# Patient Record
Sex: Male | Born: 1950 | Race: White | Hispanic: No | Smoking: Former smoker
Health system: Southern US, Community
[De-identification: ages and names within clinical notes are randomized; demographics above are authoritative.]

## PROBLEM LIST (undated history)

## (undated) DIAGNOSIS — I219 Acute myocardial infarction, unspecified: Secondary | ICD-10-CM

## (undated) DIAGNOSIS — I10 Essential (primary) hypertension: Secondary | ICD-10-CM

## (undated) DIAGNOSIS — G4730 Sleep apnea, unspecified: Secondary | ICD-10-CM

## (undated) DIAGNOSIS — E785 Hyperlipidemia, unspecified: Secondary | ICD-10-CM

## (undated) DIAGNOSIS — K7460 Unspecified cirrhosis of liver: Secondary | ICD-10-CM

## (undated) DIAGNOSIS — H269 Unspecified cataract: Secondary | ICD-10-CM

## (undated) DIAGNOSIS — F32A Depression, unspecified: Secondary | ICD-10-CM

## (undated) DIAGNOSIS — I8500 Esophageal varices without bleeding: Secondary | ICD-10-CM

## (undated) DIAGNOSIS — T7840XA Allergy, unspecified, initial encounter: Secondary | ICD-10-CM

## (undated) DIAGNOSIS — I2510 Atherosclerotic heart disease of native coronary artery without angina pectoris: Secondary | ICD-10-CM

## (undated) DIAGNOSIS — F329 Major depressive disorder, single episode, unspecified: Secondary | ICD-10-CM

## (undated) DIAGNOSIS — M1990 Unspecified osteoarthritis, unspecified site: Secondary | ICD-10-CM

## (undated) DIAGNOSIS — K219 Gastro-esophageal reflux disease without esophagitis: Secondary | ICD-10-CM

## (undated) DIAGNOSIS — D649 Anemia, unspecified: Secondary | ICD-10-CM

## (undated) HISTORY — PX: CATARACT EXTRACTION: SUR2

## (undated) HISTORY — DX: Unspecified cirrhosis of liver: K74.60

## (undated) HISTORY — DX: Depression, unspecified: F32.A

## (undated) HISTORY — DX: Allergy, unspecified, initial encounter: T78.40XA

## (undated) HISTORY — DX: Anemia, unspecified: D64.9

## (undated) HISTORY — DX: Essential (primary) hypertension: I10

## (undated) HISTORY — DX: Major depressive disorder, single episode, unspecified: F32.9

## (undated) HISTORY — DX: Atherosclerotic heart disease of native coronary artery without angina pectoris: I25.10

## (undated) HISTORY — DX: Esophageal varices without bleeding: I85.00

## (undated) HISTORY — DX: Hyperlipidemia, unspecified: E78.5

## (undated) HISTORY — DX: Gastro-esophageal reflux disease without esophagitis: K21.9

## (undated) HISTORY — DX: Acute myocardial infarction, unspecified: I21.9

## (undated) HISTORY — DX: Sleep apnea, unspecified: G47.30

## (undated) HISTORY — DX: Unspecified osteoarthritis, unspecified site: M19.90

## (undated) HISTORY — DX: Unspecified cataract: H26.9

---
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
## 1998-03-02 ENCOUNTER — Emergency Department (HOSPITAL_COMMUNITY): Admission: EM | Admit: 1998-03-02 | Discharge: 1998-03-02 | Payer: Self-pay | Admitting: Emergency Medicine

## 1998-10-31 ENCOUNTER — Ambulatory Visit (HOSPITAL_COMMUNITY): Admission: RE | Admit: 1998-10-31 | Discharge: 1998-10-31 | Payer: Self-pay

## 1998-12-16 ENCOUNTER — Ambulatory Visit (HOSPITAL_COMMUNITY): Admission: RE | Admit: 1998-12-16 | Discharge: 1998-12-16 | Payer: Self-pay

## 2008-08-21 ENCOUNTER — Ambulatory Visit: Payer: Self-pay | Admitting: Internal Medicine

## 2008-08-21 DIAGNOSIS — G4730 Sleep apnea, unspecified: Secondary | ICD-10-CM | POA: Insufficient documentation

## 2008-08-21 DIAGNOSIS — J309 Allergic rhinitis, unspecified: Secondary | ICD-10-CM | POA: Insufficient documentation

## 2008-08-21 DIAGNOSIS — F339 Major depressive disorder, recurrent, unspecified: Secondary | ICD-10-CM | POA: Insufficient documentation

## 2008-08-23 LAB — CONVERTED CEMR LAB
ALT: 34 units/L (ref 0–53)
AST: 27 units/L (ref 0–37)
Albumin: 3.9 g/dL (ref 3.5–5.2)
Alkaline Phosphatase: 43 units/L (ref 39–117)
BUN: 13 mg/dL (ref 6–23)
Basophils Relative: 0.1 % (ref 0.0–3.0)
Bilirubin, Direct: 0.1 mg/dL (ref 0.0–0.3)
Calcium: 8.7 mg/dL (ref 8.4–10.5)
Chloride: 109 meq/L (ref 96–112)
Creatinine, Ser: 1.2 mg/dL (ref 0.4–1.5)
Eosinophils Absolute: 0.3 10*3/uL (ref 0.0–0.7)
Eosinophils Relative: 7.1 % — ABNORMAL HIGH (ref 0.0–5.0)
GFR calc Af Amer: 80 mL/min
GFR calc non Af Amer: 66 mL/min
HDL: 29.9 mg/dL — ABNORMAL LOW (ref >39.0)
LDL Cholesterol: 133 mg/dL — ABNORMAL HIGH (ref 0–99)
Lymphocytes Relative: 33.7 % (ref 12.0–46.0)
Monocytes Relative: 11.9 % (ref 3.0–12.0)
Neutrophils Relative %: 47.2 % (ref 43.0–77.0)
Platelets: 139 10*3/uL — ABNORMAL LOW (ref 150–400)
RBC: 4.15 M/uL — ABNORMAL LOW (ref 4.22–5.81)
Total CHOL/HDL Ratio: 6.6
Total Protein: 6.3 g/dL (ref 6.0–8.3)
VLDL: 35 mg/dL (ref 0–40)
WBC: 4.2 10*3/uL — ABNORMAL LOW (ref 4.5–10.5)

## 2008-09-04 ENCOUNTER — Encounter (INDEPENDENT_AMBULATORY_CARE_PROVIDER_SITE_OTHER): Payer: Self-pay | Admitting: *Deleted

## 2008-09-04 ENCOUNTER — Ambulatory Visit: Payer: Self-pay | Admitting: Internal Medicine

## 2008-09-04 LAB — CONVERTED CEMR LAB: Fecal Occult Bld: NEGATIVE

## 2008-10-15 ENCOUNTER — Ambulatory Visit: Payer: Self-pay | Admitting: Internal Medicine

## 2008-10-15 DIAGNOSIS — R7309 Other abnormal glucose: Secondary | ICD-10-CM | POA: Insufficient documentation

## 2008-11-05 ENCOUNTER — Telehealth: Payer: Self-pay | Admitting: Internal Medicine

## 2008-11-06 ENCOUNTER — Encounter: Payer: Self-pay | Admitting: Internal Medicine

## 2008-11-15 ENCOUNTER — Telehealth: Payer: Self-pay | Admitting: Internal Medicine

## 2008-11-18 ENCOUNTER — Encounter: Admission: RE | Admit: 2008-11-18 | Discharge: 2008-11-18 | Payer: Self-pay | Admitting: Internal Medicine

## 2008-11-19 ENCOUNTER — Encounter: Payer: Self-pay | Admitting: Internal Medicine

## 2008-12-25 ENCOUNTER — Ambulatory Visit: Payer: Self-pay | Admitting: Internal Medicine

## 2008-12-25 DIAGNOSIS — R079 Chest pain, unspecified: Secondary | ICD-10-CM | POA: Insufficient documentation

## 2008-12-26 ENCOUNTER — Telehealth: Payer: Self-pay | Admitting: Internal Medicine

## 2009-01-01 ENCOUNTER — Ambulatory Visit: Payer: Self-pay

## 2009-01-01 ENCOUNTER — Telehealth: Payer: Self-pay | Admitting: Internal Medicine

## 2009-01-01 ENCOUNTER — Encounter: Payer: Self-pay | Admitting: Internal Medicine

## 2009-01-01 LAB — CONVERTED CEMR LAB
BUN: 25 mg/dL — ABNORMAL HIGH (ref 6–23)
CO2: 29 meq/L (ref 19–32)
Eosinophils Relative: 6.7 % — ABNORMAL HIGH (ref 0.0–5.0)
Glucose, Bld: 136 mg/dL — ABNORMAL HIGH (ref 70–99)
Hemoglobin: 15.6 g/dL (ref 13.0–17.0)
INR: 1 (ref 0.8–1.0)
Lymphocytes Relative: 30 % (ref 12.0–46.0)
Monocytes Relative: 8.9 % (ref 3.0–12.0)
Neutro Abs: 3.2 10*3/uL (ref 1.4–7.7)
Platelets: 150 10*3/uL (ref 150–400)
Potassium: 4.2 meq/L (ref 3.5–5.1)
Prothrombin Time: 10.6 s — ABNORMAL LOW (ref 10.9–13.3)
RDW: 12.6 % (ref 11.5–14.6)
Sodium: 141 meq/L (ref 135–145)
WBC: 5.9 10*3/uL (ref 4.5–10.5)

## 2009-01-03 ENCOUNTER — Ambulatory Visit: Payer: Self-pay | Admitting: Cardiology

## 2009-01-03 ENCOUNTER — Inpatient Hospital Stay (HOSPITAL_COMMUNITY): Admission: AD | Admit: 2009-01-03 | Discharge: 2009-01-05 | Payer: Self-pay | Admitting: Cardiology

## 2009-01-03 ENCOUNTER — Inpatient Hospital Stay (HOSPITAL_BASED_OUTPATIENT_CLINIC_OR_DEPARTMENT_OTHER): Admission: RE | Admit: 2009-01-03 | Discharge: 2009-01-03 | Payer: Self-pay | Admitting: Cardiology

## 2009-01-06 ENCOUNTER — Ambulatory Visit: Payer: Self-pay | Admitting: Cardiology

## 2009-01-06 ENCOUNTER — Inpatient Hospital Stay (HOSPITAL_COMMUNITY): Admission: RE | Admit: 2009-01-06 | Discharge: 2009-01-07 | Payer: Self-pay | Admitting: Cardiology

## 2009-01-09 ENCOUNTER — Encounter (INDEPENDENT_AMBULATORY_CARE_PROVIDER_SITE_OTHER): Payer: Self-pay | Admitting: *Deleted

## 2009-01-20 ENCOUNTER — Ambulatory Visit: Payer: Self-pay | Admitting: Cardiology

## 2009-01-27 HISTORY — PX: CORONARY ANGIOPLASTY WITH STENT PLACEMENT: SHX49

## 2009-02-04 ENCOUNTER — Encounter: Admission: RE | Admit: 2009-02-04 | Discharge: 2009-02-11 | Payer: Self-pay | Admitting: Internal Medicine

## 2009-02-04 DIAGNOSIS — I2510 Atherosclerotic heart disease of native coronary artery without angina pectoris: Secondary | ICD-10-CM | POA: Insufficient documentation

## 2009-02-05 ENCOUNTER — Ambulatory Visit: Payer: Self-pay

## 2009-02-05 ENCOUNTER — Ambulatory Visit: Payer: Self-pay | Admitting: Cardiology

## 2009-02-05 ENCOUNTER — Encounter: Payer: Self-pay | Admitting: Cardiology

## 2009-02-05 LAB — CONVERTED CEMR LAB
BUN: 25 mg/dL — ABNORMAL HIGH (ref 6–23)
Basophils Relative: 0.7 % (ref 0.0–3.0)
Bilirubin, Direct: 0.1 mg/dL (ref 0.0–0.3)
CO2: 29 meq/L (ref 19–32)
Calcium: 9 mg/dL (ref 8.4–10.5)
Cholesterol: 98 mg/dL (ref 0–200)
Creatinine, Ser: 1.2 mg/dL (ref 0.4–1.5)
Eosinophils Relative: 6.7 % — ABNORMAL HIGH (ref 0.0–5.0)
Glucose, Bld: 136 mg/dL — ABNORMAL HIGH (ref 70–99)
Hemoglobin: 15.6 g/dL (ref 13.0–17.0)
INR: 1 (ref 0.8–1.0)
LDL Cholesterol: 55 mg/dL (ref 0–99)
Lymphocytes Relative: 30 % (ref 12.0–46.0)
Monocytes Relative: 8.9 % (ref 3.0–12.0)
Neutro Abs: 3.2 10*3/uL (ref 1.4–7.7)
Prothrombin Time: 10.6 s — ABNORMAL LOW (ref 10.9–13.3)
RBC: 4.68 M/uL (ref 4.22–5.81)
Sodium: 141 meq/L (ref 135–145)
Total Protein: 7 g/dL (ref 6.0–8.3)
VLDL: 16 mg/dL (ref 0–40)
WBC: 5.9 10*3/uL (ref 4.5–10.5)

## 2009-02-12 ENCOUNTER — Encounter: Payer: Self-pay | Admitting: Internal Medicine

## 2009-02-27 ENCOUNTER — Telehealth: Payer: Self-pay | Admitting: Internal Medicine

## 2009-03-25 ENCOUNTER — Telehealth: Payer: Self-pay | Admitting: Internal Medicine

## 2009-03-25 DIAGNOSIS — R21 Rash and other nonspecific skin eruption: Secondary | ICD-10-CM | POA: Insufficient documentation

## 2009-04-16 ENCOUNTER — Ambulatory Visit: Payer: Self-pay | Admitting: Internal Medicine

## 2009-04-16 DIAGNOSIS — M159 Polyosteoarthritis, unspecified: Secondary | ICD-10-CM | POA: Insufficient documentation

## 2009-04-16 LAB — CONVERTED CEMR LAB
Albumin: 4.1 g/dL (ref 3.5–5.2)
Calcium: 9.1 mg/dL (ref 8.4–10.5)
Creatinine, Ser: 1.1 mg/dL (ref 0.4–1.5)
Creatinine,U: 133.6 mg/dL
Glucose, Bld: 179 mg/dL — ABNORMAL HIGH (ref 70–99)
Microalb, Ur: 1.8 mg/dL (ref 0.0–1.9)
Sodium: 139 meq/L (ref 135–145)

## 2009-04-22 ENCOUNTER — Encounter: Payer: Self-pay | Admitting: Internal Medicine

## 2009-04-25 ENCOUNTER — Ambulatory Visit: Payer: Self-pay | Admitting: Family Medicine

## 2009-05-07 ENCOUNTER — Encounter: Admission: RE | Admit: 2009-05-07 | Discharge: 2009-05-07 | Payer: Self-pay | Admitting: Family Medicine

## 2009-06-24 DIAGNOSIS — E785 Hyperlipidemia, unspecified: Secondary | ICD-10-CM | POA: Insufficient documentation

## 2009-06-25 ENCOUNTER — Ambulatory Visit: Payer: Self-pay | Admitting: Cardiology

## 2009-07-02 ENCOUNTER — Encounter: Payer: Self-pay | Admitting: Adult Health

## 2009-08-13 ENCOUNTER — Ambulatory Visit: Payer: Self-pay | Admitting: Internal Medicine

## 2009-10-02 ENCOUNTER — Ambulatory Visit: Payer: Self-pay | Admitting: Internal Medicine

## 2009-12-24 ENCOUNTER — Ambulatory Visit: Payer: Self-pay | Admitting: Cardiology

## 2010-01-14 ENCOUNTER — Telehealth: Payer: Self-pay | Admitting: Cardiology

## 2010-02-06 ENCOUNTER — Telehealth: Payer: Self-pay | Admitting: Internal Medicine

## 2010-02-11 ENCOUNTER — Ambulatory Visit: Payer: Self-pay | Admitting: Internal Medicine

## 2010-02-11 LAB — CONVERTED CEMR LAB
Microalb, Ur: 9.1 mg/dL — ABNORMAL HIGH (ref 0.0–1.9)
PSA: 1.38 ng/mL (ref 0.10–4.00)
TSH: 1.18 microintl units/mL (ref 0.35–5.50)

## 2010-02-20 ENCOUNTER — Ambulatory Visit: Payer: Self-pay | Admitting: Internal Medicine

## 2010-02-23 ENCOUNTER — Encounter: Payer: Self-pay | Admitting: Internal Medicine

## 2010-02-23 LAB — CONVERTED CEMR LAB: Fecal Occult Bld: NEGATIVE

## 2010-05-13 ENCOUNTER — Telehealth: Payer: Self-pay | Admitting: Cardiology

## 2010-05-14 ENCOUNTER — Telehealth: Payer: Self-pay | Admitting: Internal Medicine

## 2010-06-11 ENCOUNTER — Ambulatory Visit: Payer: Self-pay | Admitting: Cardiology

## 2010-08-18 ENCOUNTER — Ambulatory Visit: Payer: Self-pay | Admitting: Internal Medicine

## 2010-08-19 LAB — CONVERTED CEMR LAB: Hgb A1c MFr Bld: 8 % — ABNORMAL HIGH (ref 4.6–6.5)

## 2010-12-08 ENCOUNTER — Encounter: Payer: Self-pay | Admitting: Cardiovascular Disease

## 2010-12-08 ENCOUNTER — Ambulatory Visit
Admission: RE | Admit: 2010-12-08 | Discharge: 2010-12-08 | Payer: Self-pay | Source: Home / Self Care | Attending: Cardiovascular Disease | Admitting: Cardiovascular Disease

## 2010-12-21 DIAGNOSIS — I10 Essential (primary) hypertension: Secondary | ICD-10-CM | POA: Insufficient documentation

## 2010-12-27 LAB — CONVERTED CEMR LAB
AST: 47 units/L — ABNORMAL HIGH (ref 0–37)
Alkaline Phosphatase: 50 units/L (ref 39–117)
Alkaline Phosphatase: 73 units/L (ref 39–117)
BUN: 15 mg/dL (ref 6–23)
Basophils Absolute: 0 10*3/uL (ref 0.0–0.1)
Bilirubin, Direct: 0.1 mg/dL (ref 0.0–0.3)
Calcium: 9.6 mg/dL (ref 8.4–10.5)
Cholesterol: 116 mg/dL (ref 0–200)
Creatinine, Ser: 1.1 mg/dL (ref 0.4–1.5)
GFR calc non Af Amer: 72.39 mL/min (ref >60)
Glucose, Bld: 182 mg/dL — ABNORMAL HIGH (ref 70–99)
HDL: 28 mg/dL — ABNORMAL LOW (ref >39.00)
HDL: 37.3 mg/dL — ABNORMAL LOW (ref >39.00)
LDL Cholesterol: 36 mg/dL (ref 0–99)
LDL Cholesterol: 48 mg/dL (ref 0–99)
Lymphocytes Relative: 31.1 % (ref 12.0–46.0)
Monocytes Relative: 11.9 % (ref 3.0–12.0)
Neutrophils Relative %: 50.7 % (ref 43.0–77.0)
Platelets: 159 10*3/uL (ref 150.0–400.0)
RDW: 13.7 % (ref 11.5–14.6)
Sodium: 137 meq/L (ref 135–145)
Total Bilirubin: 0.4 mg/dL (ref 0.3–1.2)
Total Bilirubin: 0.8 mg/dL (ref 0.3–1.2)
Total CHOL/HDL Ratio: 3
Total Protein: 6.9 g/dL (ref 6.0–8.3)
VLDL: 31.2 mg/dL (ref 0.0–40.0)

## 2010-12-30 ENCOUNTER — Encounter: Payer: Self-pay | Admitting: Internal Medicine

## 2010-12-31 NOTE — Progress Notes (Signed)
Summary: rx for 90 days supply - refill meds   Phone Note Refill Request Call back at Home Phone [phone] Call back at [phone] Message from:  Patient on May 13, 2010 1:57 PM  Refills Requested: Medication #1:  EFFIENT 10 MG TABS Take one by mouth daily   Supply Requested: 3 months  Medication #2:  CRESTOR 40 MG TABS Take one by mouth daily   Supply Requested: 3 months  Medication #3:  TOPROL XL 25 MG XR24H-TAB Take one by mouth daily   Supply Requested: 3 months need insurance need new rx for 90 days .    Method Requested: Pick up at Office Initial call taken by: Lorne Skeens,  May 13, 2010 1:58 PM  Follow-up for Phone Call        script left in front Follow-up by: Burnett Kanaris, CNA,  May 18, 2010 12:00 PM

## 2010-12-31 NOTE — Progress Notes (Signed)
Summary: Effient    Phone Note Outgoing Call   Call placed by: Sherri Rad, RN, BSN,  January 14, 2010 3:42 PM Call placed to: Patient Summary of Call: I left a message for the pt to call - Effient has arrived. Sherri Rad, RN, BSN  January 14, 2010 3:42 PM  Left message to call back. Sherri Rad, RN, BSN  January 15, 2010 10:37 AM  The pt is aware his Effient has arrived and I will place this at the front desk for p/u.  Dispensed Effient 10mg  - 4 bottles with 30 tabs each. Initial call taken by: Sherri Rad, RN, BSN,  January 15, 2010 12:03 PM

## 2010-12-31 NOTE — Assessment & Plan Note (Signed)
Summary: cpx/rbh   Vital Signs:  Patient profile:   60 year old male Weight:      204 pounds Temp:     98.7 degrees F oral Pulse rate:   68 / minute Pulse rhythm:   regular BP sitting:   112 / 70  (left arm) Cuff size:   large  Vitals Entered By: Mervin Hack CMA Duncan Dull) (February 11, 2010 9:28 AM) CC: adult physical   History of Present Illness: Generally doing okay Developed some cough last week Thought maybe allergies but some dark sputum Not really sick feels better now but some lingering cough Med does help  Does have spring allergies uses loratadine as needed   Heart is doing well  Monitors blood sugar in AM 2-3 times per week 180-200 No hypoglycemia  Allergies: No Known Drug Allergies  Past History:  Past medical, surgical, family and social histories (including risk factors) reviewed for relevance to current acute and chronic problems.  Past Medical History: Reviewed history from 06/24/2009 and no changes required. Allergic rhinitis Depression Sleep Apnea Diabetes mellitus, type II CAD S/P bifurcation stenting CFX (DES x 2) and stenting bifurcation lesion distal RCA (1 DES) Osteoarthritis Good left ventricular function. Hyperlipidemia.  Past Surgical History: Reviewed history from 04/16/2009 and no changes required. Tonsillectomy-1959 Stent 3/10    ----------------------------Dr Juanda Chance  Family History: Reviewed history from 08/13/2009 and no changes required. 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 Eyes:  Denies double vision and  vision loss-1 eye; does have floaters keeps up with eye doctor. ENT:  Complains of decreased hearing and ringing in ears; wife notes mild hearing loss brief tinnitus at  Teeth okay---regular with dentist. CV:  Denies chest pain or discomfort, difficulty breathing at night, difficulty breathing while lying down, fainting, lightheadness, palpitations, and shortness of breath with exertion. Resp:  See HPI; Complains of cough; denies shortness of breath. GI:  Denies change in bowel habits, dark tarry stools, indigestion, nausea, and vomiting; occ blood on toilet paper. GU:  Complains of nocturia and urinary hesitancy; denies erectile dysfunction and urinary frequency. MS:  Complains of joint pain; denies joint swelling; left hip arthritis Pain goes down leg does exercises as needed ibuprofen. Derm:  Denies lesion(s) and rash. Neuro:  Complains of headaches; denies numbness, tingling, and weakness; occ headaches---uses tylenol as needed . Psych:  Denies anxiety and depression; stress--lost job, son with paranoid schizophrenia. Heme:  Denies abnormal bruising and enlarge lymph nodes; bruises easy due to meds. Allergy:  Complains of seasonal allergies and sneezing.  Physical Exam  General:  alert and normal appearance.   Eyes:  pupils equal, pupils round, pupils reactive to light, and no optic disk abnormalities.   Ears:  R ear normal and L ear normal.   Mouth:  no erythema, no exudates, and no lesions.   Neck:  supple, no masses, no thyromegaly, no carotid bruits, and no cervical lymphadenopathy.   Lungs:  normal respiratory effort and normal breath sounds.   Heart:  normal rate, regular rhythm, no murmur, and no gallop.   Abdomen:  soft  and non-tender.   Rectal:  no hemorrhoids and no masses.   Prostate:  no gland enlargement and no nodules.   Msk:  no joint tenderness and no joint swelling.   Pulses:  2+ in feet Extremities:  no edema Neurologic:  alert & oriented X3, strength normal in  all extremities, and gait normal.   Skin:  no rashes and no suspicious lesions.   Axillary Nodes:  No palpable lymphadenopathy Psych:  normally interactive, good eye contact, not anxious appearing, and not depressed appearing.    Diabetes Management Exam:    Foot Exam (with socks and/or shoes not present):       Sensory-Pinprick/Light touch:          Left medial foot (L-4): normal          Left dorsal foot (L-5): normal          Left lateral foot (S-1): normal          Right medial foot (L-4): normal          Right dorsal foot (L-5): normal          Right lateral foot (S-1): normal       Inspection:          Left foot: normal          Right foot: normal       Nails:          Left foot: normal          Right foot: normal   Impression & Recommendations:  Problem # 1:  PREVENTIVE HEALTH CARE (ICD-V70.0) Assessment Comment Only due for stool immunoassay and PSA discussed fitness pneumovax  Problem # 2:  DIABETES MELLITUS, TYPE II (ICD-250.00) Assessment: Comment Only  has had some high AM sugars needs to watch evening snacking increase metformin as needed   His updated medication list for this problem includes:    Metformin Hcl 500 Mg Tabs (Metformin hcl) .[name]... Take 1 tablet by mouth two times a day before meals    Aspirin Ec 325 Mg Tbec (Aspirin) .[name]... Take one tablet by mouth daily  Labs Reviewed: Creat: 1.1 (12/24/2009)     Last Eye Exam: normal (02/27/2009) Reviewed HgBA1c results: 7.1 (08/13/2009)  7.2 (04/16/2009)  Orders: TLB-Microalbumin/Creat Ratio, Urine (82043-MALB) TLB-A1C / Hgb A1C (Glycohemoglobin) (83036-A1C) TLB-TSH (Thyroid Stimulating Hormone) (84443-TSH) Venipuncture (14782)  Problem # 3:  OSTEOARTHRITIS (ICD-715.90) Assessment: Unchanged some ongoing hip and leg pain ibuprofen as needed   Problem # 4:  DEPRESSION (ICD-311) Assessment: Unchanged mood has been stable despite multiple stressors  His updated medication list for this problem  includes:    Paroxetine Hcl 20 Mg Tabs (Paroxetine hcl) .[name]... Take 2 tabs daily for depression  Problem # 5:  CAD, NATIVE VESSEL (ICD-414.01) Assessment: Unchanged no symptoms follows with cardiology  His updated medication list for this problem includes:    Toprol Xl 25 Mg Xr24h-tab (Metoprolol succinate) .[name]... Take one by mouth daily    Effient 10 Mg Tabs (Prasugrel hcl) .[name]... Take one by mouth daily    Nitrostat 0.4 Mg Subl (Nitroglycerin) .[name]... As needed    Aspirin Ec 325 Mg Tbec (Aspirin) .[name]... Take one tablet by mouth daily  Complete Medication List: 1)  Paroxetine Hcl 20 Mg Tabs (Paroxetine hcl) .... Take 2 tabs daily for depression 2)  Onetouch Lancets Misc (Lancets) .... Test daily or as directed. 3)  Onetouch Basic System W/device Kit (Blood glucose monitoring suppl) .... Use as directed dx: 250.00  onetouch ultrasoft 4)  Toprol Xl 25 Mg Xr24h-tab (Metoprolol succinate) .... Take one by mouth daily 5)  Crestor 40 Mg Tabs (Rosuvastatin calcium) .... Take one by mouth daily 6)  Effient 10 Mg Tabs (Prasugrel hcl) .... Take one by mouth daily 7)  Nitrostat 0.4 Mg Subl (Nitroglycerin) .... As needed 8)  Metformin Hcl 500 Mg Tabs (Metformin hcl) .... Take 1 tablet by mouth two times a day before meals 9)  Aspirin Ec 325 Mg Tbec (Aspirin) .... Take one tablet by mouth daily 10)  Benzonatate 200 Mg Caps (Benzonatate) .... Take 1 by mouth three times a day as needed cough  Other Orders: TLB-PSA (Prostate Specific Antigen) (84153-PSA) Pneumococcal Vaccine (16109) Admin 1st Vaccine (60454) Admin 1st Vaccine [facility]) [phone])  Patient Instructions: 1)  Please schedule a follow-up appointment in 6 months .  2)  Complete your hemoccult cards and return them soon.  Prescriptions: BENZONATATE 200 MG CAPS (BENZONATATE) take 1 by mouth three times a day as needed cough  #60 x 0   Entered and Authorized by:   Cindee Salt MD   Signed by:   Cindee Salt MD on 02/11/2010    Method used:   Electronically to        [facility]  [address] [id number] (retail)       [address]       [place], [place]  [address]       Ph: [phone] or [phone]       Fax: [phone]   RxID:   [id number]   Current Allergies (reviewed today): No known allergies    Pneumovax Vaccine    Vaccine Type: Pneumovax    Site: left deltoid    Mfr: Merck    Dose: 0.5 ml    Route: IM    Given by: Mervin Hack CMA (AAMA)    Exp. Date: 07/13/2011    Lot #: [id number]    VIS given: 06/26/96 version given February 11, 2010.

## 2010-12-31 NOTE — Progress Notes (Signed)
Summary: written script  Phone Note Refill Request Call back at Home Phone [phone] Call back at [phone] Message from:  Patient on May 14, 2010 4:20 PM  Refills Requested: Medication #1:  PAROXETINE HCL 20 MG TABS Take 2 tabs daily for depression  Medication #2:  METFORMIN HCL 1000 MG TABS take 1 by mouth two times a day. Patient needs written script for 90 day supply for his mail order.    Method Requested: Pick up at Office Initial call taken by: Melody Comas,  May 14, 2010 4:19 PM  Follow-up for Phone Call        Rx written Follow-up by: Cindee Salt MD,  May 15, 2010 1:18 PM  Additional Follow-up for Phone Call Additional follow up Details #1::        left message on machine that rx ready for pick-up  Additional Follow-up by: DeShannon Smith CMA Duncan Dull),  May 15, 2010 2:48 PM    New/Updated Medications: METFORMIN HCL 500 MG TABS (METFORMIN HCL) take 1 tablet by mouth two times a day before meals Prescriptions: METFORMIN HCL 500 MG TABS (METFORMIN HCL) take 1 tablet by mouth two times a day before meals  #180 x 3   Entered and Authorized by:   Cindee Salt MD   Signed by:   Cindee Salt MD on 05/15/2010   Method used:   Print then Give to Patient   RxID:   [id number] PAROXETINE HCL 20 MG TABS (PAROXETINE HCL) Take 2 tabs daily for depression  #180 x 3   Entered and Authorized by:   Cindee Salt MD   Signed by:   Cindee Salt MD on 05/15/2010   Method used:   Print then Give to Patient   RxID:   [id number]

## 2010-12-31 NOTE — Assessment & Plan Note (Signed)
Summary: f63m      Allergies Added: NKDA  Visit Type:  Follow-up Primary Provider:  Cindee Salt MD  CC:  no complaints.  History of Present Illness: The patient is 60 years old and return for followup management of CAD. In February of 2010 he had bifurcation stenting of the circumflex and obtuse marginal branch with Promus drug-eluting stents and he had a Promus drug eluding stent to the right coronary artery. He has done quite well since that time has had no recent chest pain shortness of breath or palpitations.  His other problems include diabetes and hyperlipidemia. His last lipid profile in January had an HDL of 37 and LDL of 48.  Current Medications (verified): 1)  Paroxetine Hcl 20 Mg Tabs (Paroxetine Hcl) .... Take 2 Tabs Daily For Depression 2)  Onetouch Lancets  Misc (Lancets) .... Test Daily or As Directed. 3)  Onetouch Basic System W/device Kit (Blood Glucose Monitoring Suppl) .... Use As Directed Dx: 250.00 Onetouch Ultrasoft 4)  Toprol Xl 25 Mg Xr24h-Tab (Metoprolol Succinate) .... Take One By Mouth Daily 5)  Crestor 40 Mg Tabs (Rosuvastatin Calcium) .... Take One By Mouth Daily 6)  Effient 10 Mg Tabs (Prasugrel Hcl) .... Take One By Mouth Daily 7)  Nitrostat 0.4 Mg Subl (Nitroglycerin) .... As Needed 8)  Aspirin Ec 325 Mg Tbec (Aspirin) .... Take One Tablet By Mouth Daily 9)  Benzonatate 200 Mg Caps (Benzonatate) .... Take 1 By Mouth Three Times A Day As Needed Cough 10)  Metformin Hcl 1000 Mg Tabs (Metformin Hcl) .... Take 1 By Mouth Two Times A Day  Allergies (verified): No Known Drug Allergies  Past History:  Past Medical History: Reviewed history from 06/24/2009 and no changes required. Allergic rhinitis Depression Sleep Apnea Diabetes mellitus, type II CAD S/P bifurcation stenting CFX (DES x 2) and stenting bifurcation lesion distal RCA (1 DES) Osteoarthritis Good left ventricular function. Hyperlipidemia.       ROS is negative except as outlined  in HPI.   Vital Signs:  Patient profile:   60 year old male Height:      70 inches Weight:      208 pounds Pulse rate:   62 / minute BP sitting:   137 / 89  (left arm) Cuff size:   large  Vitals Entered By: Burnett Kanaris, CNA (June 11, 2010 10:19 AM)  Physical Exam  Additional Exam:  Gen. Well-nourished, in no distress   Neck: No JVD, thyroid not enlarged, no carotid bruits Lungs: No tachypnea, clear without rales, rhonchi or wheezes Cardiovascular: Rhythm regular, PMI not displaced,  heart sounds  normal, no murmurs or gallops, no peripheral edema, pulses normal in all 4 extremities. Abdomen: BS normal, abdomen soft and non-tender without masses or organomegaly, no hepatosplenomegaly. MS: No deformities, no cyanosis or clubbing   Neuro:  No focal sns   Skin:  no lesions    Impression & Recommendations:  Problem # 1:  CAD, NATIVE VESSEL (ICD-414.01) He had bifurcation stenting of the circumflex artery and stenting the right current with second generation DES. He's had no chest pain his palm appears stable. One major question is how long he should stay on the Effient. I indicated that I would be inclined to keep him on it probably for 3 years. His updated medication list for this problem includes:    Toprol Xl 25 Mg Xr24h-tab (Metoprolol succinate) .[name]... Take one by mouth daily    Effient 10 Mg Tabs (Prasugrel hcl) .[name]... Take  one by mouth daily    Nitrostat 0.4 Mg Subl (Nitroglycerin) .[name]... As needed    Aspirin Ec 325 Mg Tbec (Aspirin) .[name]... Take one tablet by mouth daily  Problem # 2:  HYPERLIPIDEMIA-MIXED (ICD-272.4) His last lipid profile in January showed an HDL of 37. We will repeat that today. His updated medication list for this problem includes:    Crestor 40 Mg Tabs (Rosuvastatin calcium) .[name]... Take one by mouth daily  Orders: TLB-Hepatic/Liver Function Pnl (80076-HEPATIC) TLB-Lipid Panel (80061-LIPID)  Problem # 3:  DIABETES MELLITUS, TYPE II (ICD-250.00) he  indicated that his primary care  physician recently increased his metformin because of a hemoglobin A1c of 8.1. The following medications were removed from the medication list:    Metformin Hcl 500 Mg Tabs (Metformin hcl) .[name]... Take 1 tablet by mouth two times a day before meals His updated medication list for this problem includes:    Aspirin Ec 325 Mg Tbec (Aspirin) .[name]... Take one tablet by mouth daily    Metformin Hcl 1000 Mg Tabs (Metformin hcl) .[name]... Take 1 by mouth two times a day  Patient Instructions: 1)  Your physician recommends that you have FASTING  lab work today: lipid/liver (272.2) 2)  Your physician recommends that you continue on your current medications as directed. Please refer to the Current Medication list given to you today. 3)  Your physician wants you to follow-up in: 6 months with Dr. Excell Seltzer.  You will receive a reminder letter in the mail two months in advance. If you don't receive a letter, please call our office to schedule the follow-up appointment.

## 2010-12-31 NOTE — Assessment & Plan Note (Signed)
Summary: 6 month follow up/rbh   Vital Signs:  Patient profile:   60 year old male Weight:      207 pounds Temp:     98.4 degrees F oral Pulse rate:   60 / minute Pulse rhythm:   regular BP sitting:   140 / 90  (left arm) Cuff size:   large  Vitals Entered By: Mervin Hack CMA Duncan Dull) (August 18, 2010 9:03 AM) CC: 6 month follow-up   History of Present Illness: DOing okay Recent eval at Dr Deveron Furlong okay No chest pain Walks some--tries to go two times a day since he is still not walking  Still looking for work has had some interviews but no success  No specific diet plan Feels he is making healthy choices Eats in most of the time discussed stopping oil in frying and watching snacks  Checks sugars 175-200 still no hypoglycemic reactions  Mood about the same Gets down at times due to continued unemployment Medical problems with both kids--1 likely will never leave their house some degree of anhedonia  Allergies: No Known Drug Allergies  Past History:  Past medical, surgical, family and social histories (including risk factors) reviewed for relevance to current acute and chronic problems.  Past Medical History: Reviewed history from 06/24/2009 and no changes required. Allergic rhinitis Depression Sleep Apnea Diabetes mellitus, type II CAD S/P bifurcation stenting CFX (DES x 2) and stenting bifurcation lesion distal RCA (1 DES) Osteoarthritis Good left ventricular function. Hyperlipidemia.  Past Surgical History: Reviewed history from 04/16/2009 and no changes required. Tonsillectomy-1959 Stent 3/10    ----------------------------Dr Juanda Chance  Family History: Reviewed history from 02/11/2010 and no changes required. Dad died @81  of CHF Mom died @77  of CHF 1 brother, 2 sister Son with paranoid schizophrenia Son with uveitis No obvious CAD Dad had DM No HTN Dad with prostate cancer No colon cancer  Social History: Reviewed history from  02/11/2010 and no changes required. 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   Neck:  supple, no masses, no thyromegaly, no carotid bruits, and no cervical lymphadenopathy.   Lungs:  normal respiratory effort, no intercostal retractions, no accessory muscle use, and normal breath sounds.   Heart:  normal rate, regular rhythm, no murmur, and no gallop.   Abdomen:  soft and non-tender.   Pulses:  2+ in feet Extremities:  no edema Skin:  no suspicious lesions and no ulcerations.   Psych:  normally interactive, good eye contact, not anxious appearing, and not depressed appearing.    Diabetes Management Exam:    Foot Exam (with socks and/or shoes not present):       Sensory-Pinprick/Light touch:          Left medial foot (L-4): normal          Left dorsal foot (L-5): normal          Left lateral foot (S-1): normal          Right medial foot (L-4): normal          Right dorsal foot (L-5): normal          Right lateral foot (S-1): normal       Inspection:          Left foot: normal  Right foot: normal       Nails:          Left foot: thickened          Right foot: thickened   Impression & Recommendations:  Problem # 1:  DIABETES MELLITUS, TYPE II (ICD-250.00) Assessment Unchanged  doesn't seem much better if worse, will add glipizide discussed better lifestyle----will have him look at the [facility] plan just to see sig examples of success with lifestyle  His updated medication list for this problem includes:    Metformin Hcl 1000 Mg Tabs (Metformin hcl) .[name]... Take 1 by mouth two times a day    Aspirin Ec 325 Mg Tbec (Aspirin) .[name]... Take one tablet by mouth daily  Labs Reviewed: Creat: 1.1 (12/24/2009)     Last Eye Exam: No diabetic retinopathy.   Glaucoma  suspect--further testing planned (02/20/2010) Reviewed HgBA1c results: 8.1 (02/11/2010)  7.1 (08/13/2009)  Orders: Venipuncture (16109) TLB-A1C / Hgb A1C (Glycohemoglobin) (83036-A1C)  Problem # 2:  DEPRESSION (ICD-311) Assessment: Unchanged ongoing with continued unemployment, etc no indication for increasing meds  His updated medication list for this problem includes:    Paroxetine Hcl 20 Mg Tabs (Paroxetine hcl) .[name]... Take 2 tabs daily for depression  Problem # 3:  CAD, NATIVE VESSEL (ICD-414.01) Assessment: Unchanged follows with Dr Juanda Chance  His updated medication list for this problem includes:    Toprol Xl 25 Mg Xr24h-tab (Metoprolol succinate) .[name]... Take one by mouth daily    Effient 10 Mg Tabs (Prasugrel hcl) .[name]... Take one by mouth daily    Nitrostat 0.4 Mg Subl (Nitroglycerin) .[name]... As needed    Aspirin Ec 325 Mg Tbec (Aspirin) .[name]... Take one tablet by mouth daily  ETT Interpretation: normal-no evidence of ischemia by ST analysis (02/05/2009)  ETT Comments: Normal test. Fair exercise tolerance (02/05/2009)  Labs Reviewed: Chol: 84 (06/11/2010)   HDL: 28.00 (06/11/2010)   LDL: 36 (06/11/2010)   TG: 98.0 (06/11/2010)  Problem # 4:  HYPERLIPIDEMIA-MIXED (ICD-272.4) Assessment: Unchanged at goal  His updated medication list for this problem includes:    Crestor 40 Mg Tabs (Rosuvastatin calcium) .[name]... Take one by mouth daily  Labs Reviewed: SGOT: 43 (06/11/2010)   SGPT: 44 (06/11/2010)  Prior 10 Yr Risk Heart Disease: N/A (02/05/2009)   HDL:28.00 (06/11/2010), 37.30 (12/24/2009)  LDL:36 (06/11/2010), 48 (12/24/2009)  Chol:84 (06/11/2010), 116 (12/24/2009)  Trig:98.0 (06/11/2010), 156.0 (12/24/2009)  Complete Medication List: 1)  Paroxetine Hcl 20 Mg Tabs (Paroxetine hcl) .... Take 2 tabs daily for depression 2)  Toprol Xl 25 Mg Xr24h-tab (Metoprolol succinate) .... Take one by mouth daily 3)  Crestor 40 Mg Tabs (Rosuvastatin calcium) .... Take one by mouth  daily 4)  Effient 10 Mg Tabs (Prasugrel hcl) .... Take one by mouth daily 5)  Metformin Hcl 1000 Mg Tabs (Metformin hcl) .... Take 1 by mouth two times a day 6)  Nitrostat 0.4 Mg Subl (Nitroglycerin) .... As needed 7)  Aspirin Ec 325 Mg Tbec (Aspirin) .... Take one tablet by mouth daily 8)  Benzonatate 200 Mg Caps (Benzonatate) .... Take 1 by mouth three times a day as needed cough 9)  Onetouch Lancets Misc (Lancets) .... Test daily or as directed.  Other Orders: Flu Vaccine 34yrs + [phone]) Admin 1st Vaccine (09811) Admin 1st Vaccine [facility]) [phone])  Patient Instructions: 1)  Please try following the maintenance of the [employer]. You can look into the [facility] plan as well as an example of an aggressive lifestyle treatment 2)  Please  schedule a follow-up appointment in 6 months for physical  Current Allergies (reviewed today): No known allergies    Influenza Vaccine    Vaccine Type: Fluvax 3+    Site: left deltoid    Mfr: GlaxoSmithKline    Dose: 0.5 ml    Route: IM    Given by: Mervin Hack CMA (AAMA)    Exp. Date: 05/29/2011    Lot #: [id number]    VIS given: 06/23/10 version given August 18, 2010.  Flu Vaccine Consent Questions    Do you have a history of severe allergic reactions to this vaccine? no    Any prior history of allergic reactions to egg and/or gelatin? no    Do you have a sensitivity to the preservative Thimersol? no    Do you have a past history of Guillan-Barre Syndrome? no    Do you currently have an acute febrile illness? no    Have you ever had a severe reaction to latex? no    Vaccine information given and explained to patient? yes

## 2010-12-31 NOTE — Assessment & Plan Note (Signed)
Summary: f53m  Medications Added ASPIRIN 81 MG TBEC (ASPIRIN) Take one tablet by mouth daily MELOXICAM 15 MG TABS (MELOXICAM) Take 1 tablet by mouth once a day LISINOPRIL 5 MG TABS (LISINOPRIL) Take one tablet by mouth daily      Allergies Added: NKDA  Visit Type:  6 months follow up per Dr. Juanda Chance Primary Provider:  Cindee Salt MD  CC:  No complaints.  History of Present Illness: The patient is 60 years old and return for followup management of CAD. In February of 2010 he had bifurcation stenting of the circumflex and obtuse marginal branch with Promus drug-eluting stents and he had a Promus drug eluding stent to the right coronary artery. He has followed by Dr Juanda Chance and I will be assuming his cardiac care from here forward.  The patient is doing well without chest pain or dyspnea. He denies edema, orthopnea, or PND. He is moderately active.  Current Medications (verified): 1)  Paroxetine Hcl 20 Mg Tabs (Paroxetine Hcl) .... Take 2 Tabs Daily For Depression 2)  Toprol Xl 25 Mg Xr24h-Tab (Metoprolol Succinate) .... Take One By Mouth Daily 3)  Crestor 40 Mg Tabs (Rosuvastatin Calcium) .... Take One By Mouth Daily 4)  Effient 10 Mg Tabs (Prasugrel Hcl) .... Take One By Mouth Daily 5)  Metformin Hcl 1000 Mg Tabs (Metformin Hcl) .... Take 1 By Mouth Two Times A Day 6)  Nitrostat 0.4 Mg Subl (Nitroglycerin) .... As Needed 7)  Aspirin Ec 325 Mg Tbec (Aspirin) .... Take One Tablet By Mouth Daily 8)  Onetouch Lancets  Misc (Lancets) .... Test Daily or As Directed. 9)  Meloxicam 15 Mg Tabs (Meloxicam) .... Take 1 Tablet By Mouth Once A Day  Allergies (verified): No Known Drug Allergies  Past History:  Past medical history reviewed for relevance to current acute and chronic problems.  Past Medical History: Reviewed history from 06/24/2009 and no changes required. Allergic rhinitis Depression Sleep Apnea Diabetes mellitus, type II CAD S/P bifurcation stenting CFX (DES x 2)  and stenting bifurcation lesion distal RCA (1 DES) Osteoarthritis Good left ventricular function. Hyperlipidemia.  Review of Systems       Negative except as per HPI   Vital Signs:  Patient profile:   60 year old male Height:      70 inches Weight:      200.25 pounds BMI:     28.84 Pulse rate:   58 / minute Pulse rhythm:   regular Resp:     18 per minute BP sitting:   155 / 91  (left arm) Cuff size:   large  Vitals Entered By: Vikki Ports (December 08, 2010 10:02 AM)  Physical Exam  General:  Pt is alert and oriented, in no acute distress. HEENT: normal Neck: normal carotid upstrokes without bruits, JVP normal Lungs: CTA CV: RRR without murmur or gallop Abd: soft, NT, positive BS, no bruit, no organomegaly Ext: no clubbing, cyanosis, or edema. peripheral pulses 2+ and equal Skin: warm and dry without rash    EKG  Procedure date:  12/08/2010  Findings:      Sinus bradycardia 58 bpm, within normal limits.  Impression & Recommendations:  Problem # 1:  CAD, NATIVE VESSEL (ICD-414.01) Stable without angina. Reduce ASA to 81 mg daily as he is on Effient.  His updated medication list for this problem includes:    Toprol Xl 25 Mg Xr24h-tab (Metoprolol succinate) .[name]... Take one by mouth daily    Effient 10 Mg Tabs (Prasugrel hcl) .Marland KitchenMarland KitchenMarland KitchenMarland Kitchen  Take one by mouth daily    Nitrostat 0.4 Mg Subl (Nitroglycerin) .[name]... As needed    Aspirin 81 Mg Tbec (Aspirin) .[name]... Take one tablet by mouth daily    Lisinopril 5 Mg Tabs (Lisinopril) .[name]... Take one tablet by mouth daily  Orders: EKG w/ Interpretation (93000)  Problem # 2:  HYPERLIPIDEMIA-MIXED (ICD-272.4) LDL at goal of less than 70 mg/dL. His updated medication list for this problem includes:    Crestor 40 Mg Tabs (Rosuvastatin calcium) .[name]... Take one by mouth daily  CHOL: 84 (06/11/2010)   LDL: 36 (06/11/2010)   HDL: 28.00 (06/11/2010)   TG: 98.0 (06/11/2010)  Problem # 3:  HYPERTENSION, BENIGN (ICD-401.1) Add lisinopril  in setting DM and CAD.  His updated medication list for this problem includes:    Toprol Xl 25 Mg Xr24h-tab (Metoprolol succinate) .[name]... Take one by mouth daily    Aspirin 81 Mg Tbec (Aspirin) .[name]... Take one tablet by mouth daily    Lisinopril 5 Mg Tabs (Lisinopril) .[name]... Take one tablet by mouth daily  BP today: 155/91 Prior BP: 140/90 (08/18/2010)  Prior 10 Yr Risk Heart Disease: N/A (02/05/2009)  Labs Reviewed: K+: 4.0 (12/24/2009) Creat: : 1.1 (12/24/2009)   Chol: 84 (06/11/2010)   HDL: 28.00 (06/11/2010)   LDL: 36 (06/11/2010)   TG: 98.0 (06/11/2010)  Patient Instructions: 1)  Your physician has recommended you make the following change in your medication: START Lisinopril 5mg  take one tablet by mouth daily, DECREASE Aspirin 81mg  once a day 2)  Your physician wants you to follow-up in:  1 YEAR.  You will receive a reminder letter in the mail two months in advance. If you don't receive a letter, please call our office to schedule the follow-up appointment. Prescriptions: LISINOPRIL 5 MG TABS (LISINOPRIL) Take one tablet by mouth daily  #90 x 3   Entered by:   Julieta Gutting, RN, BSN   Authorized by:   Norva Karvonen, MD   Signed by:   Julieta Gutting, RN, BSN on 12/08/2010   Method used:   Electronically to        [facility] (retail)       [address]       [place], [place]  [address]       Ph: [phone]       Fax: [phone]   RxID:   [id number]

## 2010-12-31 NOTE — Progress Notes (Signed)
Summary: wants something for cough  Phone Note Call from Patient Call back at [phone]   Caller: Patient Call For: Cindee Salt MD Summary of Call: Patient has an appointment with you on wed next week, but he says that he needs somthing for his cough sooner than that. He says that it is a really dry cough and that he has tried OTC cough med and nothing helps it. He wants to know if he can have something called in for the cough to [facility].  Initial call taken by: Melody Comas,  February 06, 2010 3:50 PM  Follow-up for Phone Call        okay to send Rx for benzonatate 200mg   1 three times a day as needed cough #40 x 0 Follow-up by: Cindee Salt MD,  February 06, 2010 3:58 PM  Additional Follow-up for Phone Call Additional follow up Details #1::        Rx Called In, Spoke with patient and advised results.  Additional Follow-up by: Mervin Hack CMA Duncan Dull),  February 06, 2010 4:13 PM    New/Updated Medications: BENZONATATE 200 MG CAPS (BENZONATATE) take 1 by mouth three times a day as needed cough Prescriptions: BENZONATATE 200 MG CAPS (BENZONATATE) take 1 by mouth three times a day as needed cough  #40 x 0   Entered by:   Mervin Hack CMA (AAMA)   Authorized by:   Cindee Salt MD   Signed by:   Mervin Hack CMA (AAMA) on 02/06/2010   Method used:   [facility]        [facility] (retail[facility]       [address]       [place], [place]  [address]       Ph: [phone] or [phone]       Fax: [phone]   RxID:   [id number]

## 2010-12-31 NOTE — Letter (Signed)
Summary: [facility]: Immunoassay Fecal Occult Blood (iFOB) Order Form  [facility]  [address] [place], [place] [address]   Phone: [phone]  Fax: [phone]      [facility]: Immunoassay Fecal Occult Blood (iFOB) Order Form   February 11, 2010 MRN: [id number]   David [name] Apr 19, 1951   Physicican Name:_________[place]________________  Diagnosis Code:________V76.51__________________      Cindee Salt MD

## 2010-12-31 NOTE — Letter (Signed)
Summary: Results Follow up Letter  [place] [facility]  [address] [place], [place] [address]   Phone: [phone]  Fax: [phone]    02/23/2010 MRN: [id number]  David [name] [address] [place], [place]  [address]  Dear Mr. [facility],  The following are the results of your recent test(s):  Test         Result    Pap Smear:        Normal _____  Not Normal _____ Comments: ______________________________________________________ Cholesterol: LDL(Bad cholesterol):         Your goal is less than:         HDL (Good cholesterol):       Your goal is more than: Comments:  ______________________________________________________ Mammogram:        Normal _____  Not Normal _____ Comments:  ___________________________________________________________________ Hemoccult:        Normal __X___  Not normal _______ Comments: stool test is negative for blood, we will repeat this next year.   _____________________________________________________________________ Other Tests:    We routinely do not discuss normal results over the telephone.  If you desire a copy of the results, or you have any questions about this information we can discuss them at your next office visit.   Sincerely,      Tillman Abide, MD

## 2010-12-31 NOTE — Letter (Signed)
Summary: [facility]  [facility]   Imported By: Lanelle Bal 03/18/2010 11:35:21  _____________________________________________________________________  External Attachment:    Type:   Image     Comment:   External Document  Appended Document: [facility]     Clinical Lists Changes  Observations: Added new observation of DIAB EYE EX: No diabetic retinopathy.   Glaucoma suspect--further testing planned (02/20/2010 10:12)       Diabetic Eye Exam  Procedure date:  02/20/2010  Findings:      No diabetic retinopathy.   Glaucoma suspect--further testing planned

## 2010-12-31 NOTE — Assessment & Plan Note (Signed)
Summary: 6 month rov      Allergies Added: NKDA  Visit Type:  Follow-up Primary Provider:  Cindee Salt MD  CC:  no complaints.  History of Present Illness: The patient is 60 years old and return for management of CAD. In February 2010 he had bifurcation stenting of the circumflex marginal vessel with 2 drug-eluting stents and he had a stage procedure with treatment of a distal right coronary bifurcation lesion with one drug-eluting stent. For this reason he was put on Effient.  He has done well and has had no recent chest pain shortness of breath or palpitations.  Unfortunately he lost his job. He was in a housing business and designed abdicated homes. He is still trying to decide what to do.  Current Medications (verified): 1)  Paroxetine Hcl 20 Mg Tabs (Paroxetine Hcl) .... Take 2 Tabs Daily For Depression 2)  Onetouch Lancets  Misc (Lancets) .... Test Daily or As Directed. 3)  Onetouch Basic System W/device Kit (Blood Glucose Monitoring Suppl) .... Use As Directed Dx: 250.00 Onetouch Ultrasoft 4)  Toprol Xl 25 Mg Xr24h-Tab (Metoprolol Succinate) .... Take One By Mouth Daily 5)  Crestor 40 Mg Tabs (Rosuvastatin Calcium) .... Take One By Mouth Daily 6)  Effient 10 Mg Tabs (Prasugrel Hcl) .... Take One By Mouth Daily 7)  Nitrostat 0.4 Mg Subl (Nitroglycerin) .... As Needed 8)  Metformin Hcl 500 Mg Tabs (Metformin Hcl) .... Take 1 Tablet By Mouth Two Times A Day Before Meals 9)  Aspirin Ec 325 Mg Tbec (Aspirin) .... Take One Tablet By Mouth Daily  Allergies (verified): No Known Drug Allergies  Past History:  Past Medical History: Reviewed history from 06/24/2009 and no changes required. Allergic rhinitis Depression Sleep Apnea Diabetes mellitus, type II CAD S/P bifurcation stenting CFX (DES x 2) and stenting bifurcation lesion distal RCA (1 DES) Osteoarthritis Good left ventricular function. Hyperlipidemia.  Review of Systems       ROS is negative except as  outlined in HPI.   Vital Signs:  Patient profile:   60 year old male Height:      70 inches Weight:      204 pounds BMI:     29.38 Pulse rate:   69 / minute BP sitting:   111 / 80  (left arm) Cuff size:   regular  Vitals Entered By: Burnett Kanaris, CNA (December 24, 2009 9:02 AM)  Physical Exam  Additional Exam:  Gen. Well-nourished, in no distress   Neck: No JVD, thyroid not enlarged, no carotid bruits Lungs: No tachypnea, clear without rales, rhonchi or wheezes Cardiovascular: Rhythm regular, PMI not displaced,  heart sounds  normal, no murmurs or gallops, no peripheral edema, pulses normal in all 4 extremities. Abdomen: BS normal, abdomen soft and non-tender without masses or organomegaly, no hepatosplenomegaly. MS: No deformities, no cyanosis or clubbing   Neuro:  No focal sns   Skin:  no lesions    Impression & Recommendations:  Problem # 1:  CAD, NATIVE VESSEL (ICD-414.01)  He had  multivessel PCI in February of 2010. He appears stable. His platelet inhibitor is very expensive and we will try to apply for assistance. If he is unable to get this and may consider putting him on double dose Plavix and checking platelet reactivity on Plavix.  Orders: EKG w/ Interpretation (93000) TLB-BMP (Basic Metabolic Panel-BMET) (80048-METABOL) TLB-CBC Platelet - w/Differential (85025-CBCD) TLB-Hepatic/Liver Function Pnl (80076-HEPATIC) TLB-Lipid Panel (80061-LIPID)  Problem # 2:  HYPERLIPIDEMIA-MIXED (ICD-272.4)  His updated medication  list for this problem includes:    Crestor 40 Mg Tabs (Rosuvastatin calcium) .[name]... Take one by mouth daily  His updated medication list for this problem includes:    Crestor 40 Mg Tabs (Rosuvastatin calcium) .[name]... Take one by mouth daily  Orders: EKG w/ Interpretation (93000) TLB-BMP (Basic Metabolic Panel-BMET) (80048-METABOL) TLB-CBC Platelet - w/Differential (85025-CBCD) TLB-Hepatic/Liver Function Pnl (80076-HEPATIC) TLB-Lipid Panel  (80061-LIPID)  Patient Instructions: 1)  Your physician recommends that you have lab work today: bmet/cbc/lipid/liver (414.01;272.2) 2)  Your physician wants you to follow-up in: 6 months.  You will receive a reminder letter in the mail two months in advance. If you don't receive a letter, please call our office to schedule the follow-up appointment.

## 2011-02-03 ENCOUNTER — Telehealth: Payer: Self-pay | Admitting: Internal Medicine

## 2011-02-16 ENCOUNTER — Ambulatory Visit (INDEPENDENT_AMBULATORY_CARE_PROVIDER_SITE_OTHER): Payer: 59 | Admitting: Internal Medicine

## 2011-02-16 ENCOUNTER — Telehealth: Payer: Self-pay | Admitting: *Deleted

## 2011-02-16 ENCOUNTER — Encounter: Payer: Self-pay | Admitting: Internal Medicine

## 2011-02-16 DIAGNOSIS — E785 Hyperlipidemia, unspecified: Secondary | ICD-10-CM

## 2011-02-16 DIAGNOSIS — F3289 Other specified depressive episodes: Secondary | ICD-10-CM

## 2011-02-16 DIAGNOSIS — F329 Major depressive disorder, single episode, unspecified: Secondary | ICD-10-CM

## 2011-02-16 DIAGNOSIS — E119 Type 2 diabetes mellitus without complications: Secondary | ICD-10-CM

## 2011-02-16 DIAGNOSIS — Z0000 Encounter for general adult medical examination without abnormal findings: Secondary | ICD-10-CM

## 2011-02-16 DIAGNOSIS — I10 Essential (primary) hypertension: Secondary | ICD-10-CM

## 2011-02-16 DIAGNOSIS — N529 Male erectile dysfunction, unspecified: Secondary | ICD-10-CM

## 2011-02-16 DIAGNOSIS — I2510 Atherosclerotic heart disease of native coronary artery without angina pectoris: Secondary | ICD-10-CM

## 2011-02-16 LAB — LIPID PANEL
Cholesterol: 105 mg/dL (ref 0–200)
LDL Cholesterol: 49 mg/dL (ref 0–99)
Total CHOL/HDL Ratio: 3
VLDL: 16.4 mg/dL (ref 0.0–40.0)

## 2011-02-16 LAB — HEPATIC FUNCTION PANEL
Alkaline Phosphatase: 48 U/L (ref 39–117)
Bilirubin, Direct: 0.2 mg/dL (ref 0.0–0.3)
Total Bilirubin: 0.6 mg/dL (ref 0.3–1.2)
Total Protein: 6.4 g/dL (ref 6.0–8.3)

## 2011-02-16 LAB — BASIC METABOLIC PANEL
CO2: 32 mEq/L (ref 19–32)
Chloride: 106 mEq/L (ref 96–112)
Glucose, Bld: 129 mg/dL — ABNORMAL HIGH (ref 70–99)
Potassium: 4.4 mEq/L (ref 3.5–5.1)
Sodium: 143 mEq/L (ref 135–145)

## 2011-02-16 LAB — TSH: TSH: 0.9 u[IU]/mL (ref 0.35–5.50)

## 2011-02-16 MED ORDER — SILDENAFIL CITRATE 100 MG PO TABS: 50.0000 mg | ORAL_TABLET | ORAL | Status: DC | PRN

## 2011-02-16 MED ORDER — LOSARTAN POTASSIUM 50 MG PO TABS: 50.0000 mg | ORAL_TABLET | Freq: Every day | ORAL | Status: DC

## 2011-02-16 MED ORDER — GLUCOSE BLOOD VI STRP: ORAL_STRIP | Status: AC

## 2011-02-16 NOTE — Progress Notes (Signed)
Addended by: Adriana Simas on: 02/16/2011 11:01 AM   Modules accepted: Orders

## 2011-02-16 NOTE — Progress Notes (Signed)
Subjective:    Patient ID: David [name], male    DOB: [birth year], 60 y.o.   MRN: [id number]  HPI Doing well Still unemployed--"ust spending my retirement money" Physically feels fine Emotionally not so great---hard to focus and concentrate---ongoing Depressed feelings are only occ---paxil seems to help No suicidal thoughts  Diabetes control seems better with weight loss Only checks sugars Last 2 hours postprandial was 127 No hypoglycemic spells No sores in feet. Gets pain from bone spurs  Heart has been fine Now seeing Dr Excell Seltzer   Review of Systems  Constitutional: Negative for fatigue.       Weight is down 12# Usually sleeps okay---occ awakens and his mind gets going and he can't get back to sleep Wears seat belt  HENT: Negative for hearing loss, dental problem and tinnitus.   Eyes: Negative for visual disturbance.       No diplopia or monocular vision loss  Respiratory: Positive for cough. Negative for shortness of breath.        Some cough--relates to lisinopril  Cardiovascular: Negative for chest pain, palpitations and leg swelling.  Gastrointestinal: Negative for abdominal pain, constipation and blood in stool.       Occ heartburn--no meds needed  Genitourinary: Positive for difficulty urinating.       Mild hesitancy Nocturia x 2 Ongoing ED--interested in meds  Musculoskeletal: Negative for joint swelling and gait problem.       Foot pain and in hips meloxicam prn only  Skin: Negative for rash.       Easy bruising on med No suspicious lesions  Neurological: Negative for dizziness, syncope and light-headedness.  Hematological: Negative for adenopathy. Bruises/bleeds easily.  Psychiatric/Behavioral: Positive for dysphoric mood. Negative for self-injury and agitation. The patient is nervous/anxious.    Past Medical History  Diagnosis Date  . Allergy   . Depression   . Diabetes mellitus   . Arthritis   . Sleep apnea   . CAD (coronary artery disease)     s/p  bifurcation stenting (DES x2) and stenting bifurcation lesion distal RCA (1 DES)  . Hyperlipemia     Past Surgical History  Procedure Date  . Tonsillectomy 1959  . Coronary angioplasty with stent placement  March 2010    Dr. Juanda Chance    Family History  Problem Relation Age of Onset  . Heart failure Father     Deceased 5 y/o  . Diabetes Father   . Cancer Father     Prostate  . Heart failure Mother     Deceased 84 y/o  . Schizophrenia Son     Paranoid  . Uveitis Son   . Cancer Brother     prostate    History   Social History  . Marital Status: Married    Spouse Name: N/A    Number of Children: N/A  . Years of Education: N/A   Occupational History  . Not on file.   Social History Main Topics  . Smoking status: Former Smoker    Types: Cigarettes    Quit date: 11/30/1983  . Smokeless tobacco: Not on file  . Alcohol Use: Yes  . Drug Use: Not on file  . Sexually Active: Not on file   Other Topics Concern  . Not on file   Social History Narrative  . No narrative on file      Objective:   Physical Exam  Constitutional: He is oriented to person, place, and time. He appears well-developed and well-nourished. No distress.  HENT:  Right Ear: External ear normal.  Left Ear: External ear normal.  Mouth/Throat: Oropharynx is clear and moist. No posterior oropharyngeal erythema.  Neck: No JVD present. No thyromegaly present.  Cardiovascular: Normal rate, regular rhythm and normal heart sounds.  Exam reveals no gallop.   No murmur heard. Pulmonary/Chest: Effort normal and breath sounds normal. He has no wheezes. He has no rales.  Abdominal: Soft. He exhibits no mass. There is no tenderness.  Genitourinary:       Prostate exam deferred after discussion  Musculoskeletal: Normal range of motion. He exhibits no edema and no tenderness.  Lymphadenopathy:    He has no cervical adenopathy.  Neurological: He is alert and oriented to person, place, and time.       Normal  strength  Skin: Skin is warm and dry. No rash noted. No erythema.  Psychiatric: He has a normal mood and affect. Judgment and thought content normal.          Assessment & Plan:

## 2011-02-16 NOTE — Telephone Encounter (Signed)
Called patient to advise that labs are normal, advised to call if any questions.

## 2011-02-16 NOTE — Assessment & Plan Note (Signed)
Ongoing life stress but seems controlled on the medication No changes

## 2011-02-16 NOTE — Assessment & Plan Note (Signed)
Has been quiet In better shape now

## 2011-02-16 NOTE — Assessment & Plan Note (Signed)
Will try viagra 

## 2011-02-16 NOTE — Assessment & Plan Note (Signed)
Seems to have good control Will check labs 

## 2011-02-16 NOTE — Assessment & Plan Note (Signed)
Repeat by me 144/94 on right Will change to losartan since having cough even on low dose of lisinopril

## 2011-02-16 NOTE — Progress Notes (Signed)
Summary: clarification on metformin script  Phone Note From Pharmacy   Caller: [facility] Summary of Call: [facility] has sent a form asking for clarification on pt's metformin script.  Chart has that he takes 1000 mg's twice a day.  Form is on Dr. Karle Starch desk.             Lowella Petties CMA, AAMA  February 08, 2011 10:28 AM   Follow-up for Phone Call        now on 1000mg  two times a day  Follow-up by: Cindee Salt MD,  February 08, 2011 1:25 PM  Additional Follow-up for Phone Call Additional follow up Details #1::        form faxed back with clarification Additional Follow-up by: DeShannon Katrinka Blazing CMA Duncan Dull),  February 08, 2011 2:14 PM

## 2011-02-16 NOTE — Telephone Encounter (Signed)
Message copied by Mervin Hack on Tue Feb 16, 2011  3:45 PM ------      Message from: Tillman Abide      Created: Tue Feb 16, 2011  1:27 PM       Labs look fine      Diabetes control is improved with HgbA1c down to 6.8%      Chol at goal with total of 105 and LDL or bad chol of 49      Liver, kidney, thyroid and prostate tests are all normal

## 2011-02-16 NOTE — Progress Notes (Signed)
Addended by: Adriana Simas on: 02/16/2011 11:33 AM   Modules accepted: Orders

## 2011-02-16 NOTE — Assessment & Plan Note (Signed)
Prefers no colonoscopy Will do stool immunoassay Discussed PSA---will continue (esp with strong FH)

## 2011-03-16 LAB — CBC
HCT: 37.5 % — ABNORMAL LOW (ref 39.0–52.0)
MCHC: 35.2 g/dL (ref 30.0–36.0)
MCV: 94.3 fL (ref 78.0–100.0)
MCV: 95.1 fL (ref 78.0–100.0)
Platelets: 168 10*3/uL (ref 150–400)
RBC: 3.98 MIL/uL — ABNORMAL LOW (ref 4.22–5.81)
WBC: 6.9 10*3/uL (ref 4.0–10.5)
WBC: 7.6 10*3/uL (ref 4.0–10.5)

## 2011-03-16 LAB — BASIC METABOLIC PANEL
BUN: 13 mg/dL (ref 6–23)
CO2: 28 mEq/L (ref 19–32)
Calcium: 9 mg/dL (ref 8.4–10.5)
Chloride: 100 mEq/L (ref 96–112)
Chloride: 104 mEq/L (ref 96–112)
Creatinine, Ser: 1.14 mg/dL (ref 0.4–1.5)
GFR calc Af Amer: >60 mL/min (ref >60)
GFR calc Af Amer: >60 mL/min (ref >60)
Glucose, Bld: 121 mg/dL — ABNORMAL HIGH (ref 70–99)
Sodium: 138 mEq/L (ref 135–145)

## 2011-03-16 LAB — GLUCOSE, CAPILLARY
Glucose-Capillary: 106 mg/dL — ABNORMAL HIGH (ref 70–99)
Glucose-Capillary: 115 mg/dL — ABNORMAL HIGH (ref 70–99)
Glucose-Capillary: 136 mg/dL — ABNORMAL HIGH (ref 70–99)
Glucose-Capillary: 144 mg/dL — ABNORMAL HIGH (ref 70–99)
Glucose-Capillary: 163 mg/dL — ABNORMAL HIGH (ref 70–99)
Glucose-Capillary: 189 mg/dL — ABNORMAL HIGH (ref 70–99)

## 2011-03-16 LAB — CK TOTAL AND CKMB (NOT AT ARMC): Total CK: 446 U/L — ABNORMAL HIGH (ref 7–232)

## 2011-03-26 ENCOUNTER — Other Ambulatory Visit: Payer: Self-pay | Admitting: Internal Medicine

## 2011-03-26 DIAGNOSIS — I10 Essential (primary) hypertension: Secondary | ICD-10-CM

## 2011-03-30 ENCOUNTER — Telehealth: Payer: Self-pay | Admitting: *Deleted

## 2011-03-30 ENCOUNTER — Other Ambulatory Visit (INDEPENDENT_AMBULATORY_CARE_PROVIDER_SITE_OTHER): Payer: 59 | Admitting: Internal Medicine

## 2011-03-30 DIAGNOSIS — I10 Essential (primary) hypertension: Secondary | ICD-10-CM

## 2011-03-30 LAB — BASIC METABOLIC PANEL
Calcium: 8.7 mg/dL (ref 8.4–10.5)
Chloride: 101 mEq/L (ref 96–112)
Creatinine, Ser: 0.8 mg/dL (ref 0.4–1.5)
Sodium: 140 mEq/L (ref 135–145)

## 2011-03-30 NOTE — Telephone Encounter (Signed)
Message left on cell No meds for "attention" would be safe with his heart Problems probably stem from the mood issues from prolonged unemployment He will have to start and see how he does----then come in to discuss issues if still having trouble with his job

## 2011-03-30 NOTE — Telephone Encounter (Signed)
Patient wanted to let you know that he finally has found a job after a year of searching, but he is concerned because of his focusing issues. He says that he has discussed this with you many times. He is asking if he could possibly get something called in to try and see if it helps. Please advise. [facility].

## 2011-04-13 NOTE — Discharge Summary (Signed)
David [name], David [name] NO.:  [id number]   MEDICAL RECORD NO.:  [id number]          PATIENT TYPE:  INP   LOCATION:  2503                         FACILITY:  [facility]   PHYSICIAN:  Everardo Beals. Juanda Chance, MD, FACCDATE OF BIRTH:  1951/03/14   DATE OF ADMISSION:  01/06/2009  DATE OF DISCHARGE:  01/07/2009                               DISCHARGE SUMMARY   PRIMARY CARDIOLOGIST:  Everardo Beals. Juanda Chance, MD, [facility].   PRIMARY CARE PHYSICIAN:  [name], MD.   PROCEDURES PERFORMED DURING HOSPITALIZATION:  1. Cardiac catheterization.  A:  Successful percutaneous coronary intervention of a bifurcation  lesion in the posterior descending and posterolateral branches of the  right coronary artery using a Zion 2.75 x 18-mm drug-eluting stent with  distal right and posterior descending artery stenosis, reducing it from  95% to 0% with preservation of flow in the posterolateral branch with  80% residual stenosis at the ostium.  1. Cutting balloon angioplasty to the distal right coronary artery      stenting in the posterolateral branch improved from 95% to 30%      following stenting at the ostium, renarrowed to 80%.   FINAL DISCHARGE DIAGNOSES:  1. Coronary artery disease.  A:  Status post cardiac catheterization completed on January 03, 2009,  which revealed 50% narrowing in the mid LAD, 80% narrowing in the mid  circumflex artery, and 90% stenosis in the first and second  posterolateral branch of the circumflex artery (bifurcation lesion), and  95% stenosis of the distal right coronary artery, with 95% stenosis in  the posterior descending branch of the right coronary artery  (bifurcation lesion), and normal left ventricular function.  B:  Successful percutaneous coronary intervention of the mid and distal  circumflex artery and posterolateral branch of the circumflex artery  (bifurcation lesion) using 2 Zion stents in the main circumflex artery  and third Zion stent in the side  branch.  1. Status post Myoview scan on January 01, 2009, showing marked      anterior ST segment depression associated with chest pressure.  The      scan itself did not show any clear reversible defect with an      ejection fraction of 65%.  2. Sleep apnea.  3. Diabetes.  4. Depression.  5. Allergic rhinitis.   HOSPITAL COURSE:  This 60 year old Caucasian male who had recently  developed symptoms of exertional angina, had stress Myoview on January 01, 2009, revealing marked anterior lateral ST-segment depression  associated with chest pressure.  The patient subsequently had cardiac  catheterization on January 03, 2009, with PCI to the circumflex artery,  which was a lengthy procedure requiring three drug-eluting stents placed  secondary to tight lesions at the bifurcation and mid to distal  circumflex.  The patient also was found to have a right coronary artery  lesion, and the patient returned on January 06, 2009, for PCI of the  right coronary artery.   The patient did have successful PCI intervention at the bifurcation  lesion of the first anterior descending and posterolateral branches of  right  coronary artery with improvement in the distal right and posterior  descending stenosis from 95% to 0% with preservation of flow in the  posterior branch with an 80% residual stenosis at the ostium.  The  patient recovered well without evidence of bleeding, hematoma, or signs  of infection.  The patient's lab work was reviewed and was found to be  within normal limits with no evidence of contrast nephropathy.  The  patient was anxious to return home.  He will be sent home on Prasugrel  in lieu of Plavix and will also be started on Crestor and Lipitor will  be discontinued.  The patient will follow up with Dr. Juanda Chance on a  previously scheduled appointment prior to this admission on January 20, 2009.  The patient has been given post cardiac catheterization  instructions as well.    DISCHARGE LABS:  Sodium 134, potassium 3.9, chloride 100, CO2 27,  glucose 157, BUN 13, creatinine 1.14, hemoglobin 14.9, hematocrit 43.0,  white blood cell 7.6, platelets 168.  EKG on discharge revealing normal  sinus rhythm with ventricular rate of 73 beats per minute.   DISCHARGE VITAL SIGNS:  Blood pressure 122/73, heart rate 82,  respirations 18, temperature 97.7, O2 sat 94% on room air.   DISCHARGE MEDICATIONS:  1. Prasugrel 10 mg daily.  2. Enteric-coated aspirin 325 daily.  3. Toprol-XL 25 mg daily.  4. Nitrostat 0.4 mg sublingual p.r.n. chest pain.  5. Crestor 40 mg at bedtime (the patient is requested to stop taking      Lipitor, as he was taking on admission and begin the Crestor.   FOLLOWUP PLANS AND APPOINTMENTS:  1. The patient will follow up with Dr. Charlies Constable on a previously      scheduled appointment on January 20, 2009, at 4:30 p.m.  2. The patient will follow up with Dr. [name].  He will make this      appointment on his own accord.  3. The patient has been given post cardiac catheterization      instructions with particular emphasis on the right groin site for      evidence of bleeding, hematoma, or infection.  4. The patient has been advised to bring all medications to followup      appointment with Dr. Juanda Chance.   Time spent with the patient to include physician time, 35 minutes.      Bettey Mare. Lyman Bishop, NP      Everardo Beals. Juanda Chance, MD, [facility]  Electronically Signed    KML/MEDQ  D:  01/07/2009  T:  01/07/2009  Job:  [id number]   cc:   [name], MD

## 2011-04-13 NOTE — Cardiovascular Report (Signed)
NAMEBOE, DEANS NO.:  [id number]   MEDICAL RECORD NO.:  [id number]          PATIENT TYPE:  INP   LOCATION:  2503                         FACILITY:  [facility]   PHYSICIAN:  Everardo Beals. Juanda Chance, MD, FACCDATE OF BIRTH:  [birth year]   DATE OF PROCEDURE:  DATE OF DISCHARGE:                            CARDIAC CATHETERIZATION   CLINICAL HISTORY:  Mr. Goodson is 60 years old and recently developed  symptoms of exertional angina.  He had a Myoview scan, ordered by Dr.  [name], which was positive for ST changes and pain, although he did not  have a definite perfusion defect.  We studied him on Friday in the [facility]  lab, and he had tight bifurcation lesion in the mid-to-distal circumflex  artery and a tight bifurcation lesion in the mid-to-distal right  coronary artery.  We did the circumflex artery on Friday.  It was a  difficult procedure requiring a bifurcation stenting using a reverse  mini crush technique, and the patient had transient no-reflow in the  posterolateral branch and bump in his enzymes slightly.  We kept him in  an extra day and then let him go home yesterday, and he came back for  intervention of the right coronary.   PROCEDURE:  The procedure was performed via the right femoral arterial  using an arterial sheath and a 7-French JR4 guiding catheter with side  holes.  The patient was given Angiomax bolus and infusion.  He had been  treated with Prasugrel and received 10 mg of Prasugrel this morning, as  well as 4 chewable aspirin.  We navigated 2PT light support wires down  the right coronary artery, one in the posterior descending branch and  one in the posterolateral branch.  We then dilated both branches with a  2.0 x 20 mm Apex balloon, performing 2 inflation at each site up to 8  atmospheres for 20 seconds.  We then decided to treat the posterior  descending branch of the main channel, and we performed cutting balloon  angioplasty on the side channel, which  was a posterolateral branch.  We  used a 2.25 x 15 mm cutter and performed 4 inflations up to 8  atmospheres for 30 seconds.  We then deployed a 2.75 x 18 mm XIENCE  stent in the distal right coronary artery into the posterior descending  branch of the right coronary artery and crossing the posterolateral  branch.  We deployed this with 1 inflation of 9 atmospheres for 30  seconds.  We then removed the wire from the posterolateral branch and re-  routed it through the stent into the posterolateral branch.  We then  postdilated the stent with a 3.0 x 15 mm [place] Robinson Voyager, performing 2  inflations up to 16 atmospheres for 30 seconds.  At this point, we had  brisk flow in the posterolateral branch and the ostium was narrowed  about 80%.  We elected to leave this and not performed kissing balloon  angioplasty.  The patient tolerated the procedure well and left the  laboratory in satisfactory condition.   RESULTS:  Initially, the  stenosis in the distal right coronary artery  into the posterior descending branch was estimated at 95%.  Following  stenting, this improved to 0%.   Initially stenosis in the distal right coronary artery stenting in the  posterolateral branch was 95%.  Following cutting balloon angioplasty,  this improved to 30% and following stenting at the ostium, re-narrowed  to 80%.   CONCLUSION:  Successful percutaneous coronary intervention of a  bifurcation lesion in the posterior descending and posterolateral  branches of the right coronary artery with improvement in the distal  right and posterior descending artery stenosis from 95% to 0% with  preservation of flow in the posterolateral branch with 80% residual  stenosis at the ostium.      Bruce [name] Juanda Chance, MD, [facility]  Electronically Signed     BRB/MEDQ  D:  01/06/2009  T:  01/07/2009  Job:  [id number]   cc:   [name], MD

## 2011-04-13 NOTE — Assessment & Plan Note (Signed)
[facility]                            CARDIOLOGY OFFICE NOTE   PRAYAN, ULIN                         MRN:          [id number]  DATE:02/05/2009                            DOB:          Feb 04, 1951    David [name] came in for a treadmill test today and did quite well and had  a fairly good exercise tolerance and a good heart rate but no chest pain  or EKG changes.   He was started on prasugrel because he has a bifurcation stenting in the  circumflex artery and another bifurcation lesion with 1 stent in the  distal right coronary artery.  So far, his insurance has not paid for  this and he had to pay 186 dollars out of pocket.  [name] is going  to research the payment for this, and we might consider switching him to  Plavix or double-dose Plavix.   He was recently started on Crestor, and we will get a followup lipid and  liver profile today and will arrange a followup visit in 3 months.     Bruce [name] Juanda Chance, MD, [facility]  Electronically Signed    BRB/MedQ  DD: 02/05/2009  DT: 02/06/2009  Job #: [id number]

## 2011-04-13 NOTE — Cardiovascular Report (Signed)
NAME:  David [name], David [name] NO.:  [id number]   MEDICAL RECORD NO.:  [id number]          PATIENT TYPE:  OIB   LOCATION:  1965                         FACILITY:  [facility]   PHYSICIAN:  Bruce R. Juanda Chance, MD, FACCDATE OF BIRTH:  08-02-1951   DATE OF PROCEDURE:  01/03/2009  DATE OF DISCHARGE:                            CARDIAC CATHETERIZATION   HISTORY:  Mr. Pennisi is 60 year old and recently developed exertional  chest pain and was seen by Dr. [name] who arranged for him to have a  Myoview scan.  He had pain and significant ST-segment depression on his  stress test, although there was no reversible defect seen on the scan.  The ejection fraction was 65%.  I saw him in office and scheduled him  for evaluation with angiography.  He has a positive family history for  stroke and has recently diagnosed diabetes, but has no known  hypertension or hyperlipidemia.   He does not smoke.   PROCEDURE:  The procedure was performed by the right femoral artery and  arterial sheath and 4-French pyriform coronary catheters.  A front wall  arterial puncture was performed, and Omnipaque contrast was used.  The  patient tolerated the procedure well and left the laboratory in  satisfactory condition.   RESULTS:  Left main coronary artery:  The left main coronary artery was  free of disease.   Left anterior descending artery:  The left anterior descending artery  gave rise to the septal perforator diagonal branch and a second septal  perforator.  The LAD was irregular in its proximal midportion and there  was a 50% narrowing in the midvessel.   Circumflex artery:  The circumflex artery gave rise to a small ramus  branch, a large marginal branch, and two posterolateral branches.  There  was 80% narrowing in the mid circumflex artery after the marginal branch  and there was 90% stenoses at the ostium of the first and second  posterolateral branches, which was a bifurcation lesion.   Right  coronary artery:  The right coronary artery is a small to moderate-  sized vessel gave rise to a conus branch, three right ventricular  branches, a posterior descending branch, and a posterolateral branch.  There was 95% stenosis in the distal right coronary artery and 95%  stenosis in the ostium of the posterior descending branch, which  represented a bifurcation lesion.  The posterior descending branch  filled faintly via collaterals from the left coronary artery.   Left ventriculogram.  The left ventriculogram performed in the RAO  projection showed good wall motion with no areas of hypokinesis.  The  estimated ejection fraction was 60%.   The aortic pressure was 133/83 with mean of 105, left neck pressure was  133/18.   CONCLUSION:  Coronary artery disease with 50% narrowing in the mid LAD,  80% narrowing in the mid circumflex artery with 90% stenosis in the  first and second posterolateral branches of the circumflex artery  (bifurcation lesion) and 95% stenosis of distal right coronary artery  with 95% stenosis in the posterior descending branch of the right  coronary artery (bifurcation lesion) and normal LV function.   RECOMMENDATIONS:  The patient is quite symptomatic and has critical  disease in the circumflex and right coronary arteries.  Both lesions are  bifurcation lesions, which will make them technically more difficult but  I think percutaneous intervention is the best option.  We will plan to  do the circumflex lesion today and plan on a staged procedure for the  right early next week.      Bruce [name] Juanda Chance, MD, [facility]  Electronically Signed     BRB/MEDQ  D:  01/03/2009  T:  01/04/2009  Job:  [id number]   cc:   [name], MD

## 2011-04-13 NOTE — Discharge Summary (Signed)
NAMEDARONTE, SHOSTAK NO.:  [id number]   MEDICAL RECORD NO.:  [id number]          PATIENT TYPE:  INP   LOCATION:  2037                         FACILITY:  [facility]   PHYSICIAN:  Jesse Sans. Wall, MD, FACCDATE OF BIRTH:  02/05/1951   DATE OF ADMISSION:  01/03/2009  DATE OF DISCHARGE:  01/05/2009                               DISCHARGE SUMMARY   PRIMARY CARDIOLOGIST:  Everardo Beals. Juanda Chance, MD, [facility]   PRINCIPAL DISCHARGE DIAGNOSES:  1. Multivessel coronary artery disease.      a.     Status post drug-eluting stenting of circumflex artery and       posterolateral branch.      b.     Residual high-grade distal right coronary artery stenosis.      c.     Normal ventricular function.   SECONDARY DIAGNOSIS:  Type 2 diabetes mellitus, recently diagnosed.   PROCEDURE:  Elective coronary angiography, January 03, 2009, by Dr.  Charlies Constable.   HOSPITAL COURSE:  The patient presented for elective coronary  angiography, following a recent referral for further evaluation of  exertional chest discomfort and a subsequent abnormal perfusion imaging  study.   The patient was found to have a multivessel coronary artery disease, by  Dr. Juanda Chance, and underwent successful percutaneous intervention with  placement of a Xience drug-eluting stents to the circumflex artery (x2),  as well as to the posterolateral branch.   Postop course notable for a small increase in his cardiac markers (CPK  450/21), but with no associated chest discomfort.  The patient was kept  for an additional day for overnight observation, was cleared for  discharge the following morning in hemodynamically stable condition.  He  denied any angina pectoris following the procedure.   Of note, the patient was started on high-dose Lipitor prior to  discharge.  He was not on a statin prior to admission.   DISCHARGE MEDICATIONS:  1. Prasugrel 10 mg daily.  2. Enteric-coated aspirin 325 mg daily.  3. Toprol-XL 25 mg  daily.  4. Lipitor 80 mg daily.  5. Nitrostat 0.4 mg p.r.n.   INSTRUCTIONS:  The patient is to return to [facility]  on Monday, January 06, 2009, at 8:30 a.m., in preparation for a  scheduled elective percutaneous intervention at 10:30 a.m.  1. The patient is to refrain from eating/drinking after midnight, but      is to take his morning medications.   DISPOSITION:  Stable.   DISCHARGE MEDICATIONS:  None.   LABORATORY DATA:  Normal electrolytes, renal function.  Postprocedure  cardiac enzymes:  CPK 446/20.9 (4.7%).   DISCHARGING COUNTER TIME:  Greater than 30 minutes.      Gene Serpe, PA-C      Thomas C. Daleen Squibb, MD, [facility]  Electronically Signed    GS/MEDQ  D:  01/05/2009  T:  01/05/2009  Job:  [id number]   cc:   [name], MD

## 2011-04-13 NOTE — Assessment & Plan Note (Signed)
[facility]                            CARDIOLOGY OFFICE NOTE   David [name], David [name]                         MRN:          [id number]  DATE:01/01/2009                            DOB:          [birth year]    PRIMARY CARE PHYSICIAN:  [name], MD   REASON FOR REFERRAL:  Chest pain and abnormal Myoview scan.   CLINICAL HISTORY:  David [name] is 60 year old and has no prior history of  known heart disease.  In late December while walking on the elliptical,  he noticed that he developed some substernal chest pain.  This was  predictable and he saw David [name] for this who arranged for him to be  evaluated with a Myoview scan.  A Myoview scan that he had today showed  rather marked anterolateral ST segment depression associated with chest  pressure.  The scan itself did not show any clear reversible defect.  His ejection fraction was 65%.   PAST MEDICAL HISTORY:  Significant for sleep apnea and recently  diagnosed diabetes which is being controlled with diet.  He also has a  history of depression and allergic rhinitis.  Negative for  hyperlipidemia and hypertension.   CURRENT MEDICATIONS:  Paxil 20 mg daily.   FAMILY HISTORY:  Positive for vascular disease in his father and had a  stroke.  There is no family history of heart attacks.   SOCIAL HISTORY:  He is married and has 2 sons, one of whom is still at  home.  He quit smoking many years ago.  He works as a Proofreader.   REVIEW OF SYSTEMS:  Significant for arthritic pain.   PHYSICAL EXAMINATION:  VITAL SIGNS:  Today, the blood pressure was  159/86 and the pulse was 80 and regular.  NECK:  There was no venous distention.  The carotid pulses were full  without bruits.  CHEST:  Clear.  CARDIAC:  Rhythm was regular.  HEART:  Sounds were normal and there were no murmurs or gallops.  HEENT:  The head was normocephalic.  ABDOMEN:  Soft with normal bowel sounds.  There are no masses and no  hepatosplenomegaly.  EXTREMITIES:  Peripheral pulses were full and there is no peripheral  edema.  MUSCULOSKELETAL:  No deformities.  SKIN:  Warm and dry.   Examination showed no focal neurological signs.   The baseline electrocardiogram was normal.   IMPRESSION:  1. Exertional chest pain and abnormal Myoview scan with ST segment      depression, strongly suggestive of ischemic heart disease.  2. Recently diagnosed diabetes, currently diet controlled.  3. History of depression.   RECOMMENDATIONS:  David [name] symptoms are fairly typical for angina with  his markedly positive ECG changes.  I feel he very likely has  significant ischemic heart disease.  We will plan to arrange for him to  come in the hospital on Friday for outpatient coronary angiography.  We  will start him on Toprol 25 mg daily as well as 325 mg of aspirin and  p.r.n. nitroglycerin.     Bruce [name] Juanda Chance, MD, [facility]  Electronically Signed    BRB/MedQ  DD: 01/01/2009  DT: 01/02/2009  Job #: [id number]

## 2011-04-13 NOTE — Cardiovascular Report (Signed)
NAME:  David [name], David [name] NO.:  [id number]   MEDICAL RECORD NO.:  [id number]          PATIENT TYPE:  INP   LOCATION:  2501                         FACILITY:  [facility]   PHYSICIAN:  Everardo Beals. Juanda Chance, MD, FACCDATE OF BIRTH:  08-01-1951   DATE OF PROCEDURE:  01/03/2009  DATE OF DISCHARGE:                            CARDIAC CATHETERIZATION   CLINICAL HISTORY:  David [name] is 60 years old and works in the building  business.  He had a recent onset of chest pain and had a Myoview scan  during which he had chest pain and ST changes, although no perfusion  defects.  He was studied this morning in the [facility] and found to have a  lesion in the mid circumflex artery and a lesion at the bifurcation of  the circumflex artery in the first posterolateral branch.  He was also  found to have a bifurcation lesion at the distal right coronary artery  into the post descending branch of 95%.  We brought him upstairs to fix  the circumflex lesion today with plans to stage the right coronary  artery lesions.   PROCEDURE:  The procedure was performed via the femoral artery using an  arterial sheath and a 7-French CLS 4 guiding catheter with side holes.  The patient was given Angiomax bolus and infusion and had been  previously loaded with 600 mg of Plavix and baby aspirin.  We passed a  Prowater wire down the AV circumflex into the second posterolateral  branch and one into the first posterolateral branch.  We then dilated  the mid circumflex lesion and the lesion into the first posterolateral  branch with 2.25 x 15-mm apex balloon.  We then used a cutting balloon  to dilate the AV circumflex artery just after the first posterolateral  branch.  This caused a split in the vessel.  We initially planned to  treat the first posterolateral branch with the main channel, but after  we developed a split in the AV circumflex artery before the second  posterolateral branch, we decided to make this the  main channel.  We  then stented this with a 2.5 x 23-mm Xience stent.  We deployed this  with 1 inflation of 10 atmospheres for 30 seconds.  This resulted in no  reflow down the first posterolateral branch and we rewired this.  We  used a [employer] to rewire this and then we dilated it with a  2.25 x 15-mm apex balloon, which reestablished flow.  We then deployed a  3.5 x 12-mm stent in the midcircumflex artery overlapping the previously  placed stent, which was more distal.  We deployed this with 1 inflation  of 15 atmospheres for 30 seconds.  We then rewired the first  posterolateral branch with the [employer] wire.  We dilated with a  2.25 x 50-mm balloon, but this did not give an optimal result.  We  decided we needed to stent this vessel since this was the dominant  vessel.  We passed a 2.5 x 50-mm Xience stent and positioned this just  inside  the other stent for a crush technique.  We placed a 2.75 x 15-mm  Chester Voyager in the main channel while we deployed the stent in the side  channel, which was the first posterolateral branch.  We deployed this  with 1 inflation of 10 atmospheres for 30 seconds.  We then removed the  stent balloon and the wire and crushed the stent with a 2.75 x 50-mm  Quantum Maverick.  We then rewired the first posterolateral branch and  performed kissing balloon angioplasty with a 2.75 x 50-mm Bingham Voyager in  the main channel and a 2.5 x 15-mm McNary Voyager in the side channel  inflating both balloons up to 12 atmospheres for 30 seconds.  We had a  great deal of difficulty getting the balloon into the side channel and  had to do this with a 1.5 x 15-mm Voyager balloon.  Final diagnostics  were then performed with the guiding catheter.   It was a long difficult procedure that lasted about 2 hours and 40  minutes.  The patient had chest pain with no reflow, but overall  tolerated the procedure well and left the laboratory in stable  condition.  We used  a bailout IIb/IIIa inhibitor after we had no reflow.  The final result looked good with good flow down both vessels.   CONCLUSION:  Successful PCI of the mid and distal circumflex artery and  the posterolateral branch of the circumflex artery (bifurcation lesion  using 2 Xience stents in the main circumflex artery and a third Xience  stent in the side branch, which was the posterolateral branch crush  technique) with improvement in the main channel from 80% and 90% to 0%,  improvement in the side channel from 90% to 0%.   DISPOSITION:  The patient returned to [facility] room for further  observation.  We will reevaluate him tomorrow and plan to treat the  right coronary artery on Monday.  We will decide whether he should stay  in the hospital or whether he may be able to go home tomorrow.      David [name] Juanda Chance, MD, [facility]  Electronically Signed     BRB/MEDQ  D:  01/03/2009  T:  01/04/2009  Job:  [id number]   cc:   David Schwalbe, MD

## 2011-04-13 NOTE — Assessment & Plan Note (Signed)
[facility]                            CARDIOLOGY OFFICE NOTE   JIBRI, SCHRIEFER                         MRN:          [id number]  DATE:01/20/2009                            DOB:          1951/10/25    CLINICAL HISTORY:  Mr. David [name] returned for a followup management of his  coronary heart disease after his recent hospitalization and  intervention.  He had developed exertional chest pain and Dr. [name]  arranged from him to have a Myoview scan, which was abnormal.  We  brought him in for catheterization and found a tight lesion at the  distal cervix artery at the bifurcation of the first and second  posterolateral branch and a tight lesion in the distal right coronary  bifurcation of the posterior descending and posterolateral branch.  He  underwent difficult intervention of the circumflex artery with  bifurcation stenting using a reverse mini-crush technique.  One of the  side branches was closed temporarily and had a small enzyme leak and  they kept him an extra day.  We brought him back 2 days later for  intervention of the right coronary artery and was able to treat this  with 1 stent.   He has done well since discharge and no recurrent symptoms.  He is back  at work.  He has been reluctant to get back on his treadmill testing.   PAST MEDICAL HISTORY:  Significant for hyperlipidemia, obstructive sleep  apnea, and recently diagnosed diabetes, which have been treated with  diet.  He also has a history of depression.  He has recent  hyperlipidemia.   CURRENT MEDICATIONS:  1. Paxil.  2. Aspirin.  3. Toprol-XL 25 mg daily.  4. Prasugrel 10 mg daily.  5. Crestor 40 mg daily.   PHYSICAL EXAMINATION:  VITAL SIGNS:  Today, blood pressure was 112/71  and pulse 57 and regular.  NECK:  There was no venous distention.  The carotid pulses were full  without bruits.  CHEST:  Clear.  CARDIAC:  Rhythm was regular.  I could hear no murmurs or gallops.  ABDOMEN:  Soft.  Normal bowel sounds.  EXTREMITIES:  Peripheral pulses were full with no peripheral edema.   Electrocardiogram showed T-wave inversion in lead III.   IMPRESSION:  1. Coronary artery disease, status post recent percutaneous coronary      intervention with bifurcation stenting of the distal circumflex      artery and a single stent for bifurcation lesion in the distal      right coronary with a small peri-procedural infarct following his      first intervention.  2. Good left ventricular function.  3. Hyperlipidemia.  4. Adult-onset diabetes.  5. Obstructive sleep apnea.   RECOMMENDATIONS:  I think Mr. Biehn is doing very well.  He is a little  bit reluctant to get back into his exercise and I think a treadmill test  would help to evaluate this and reassure him.  We will plan a regular  stress test in 2-3 weeks.  Crestor is a new drug for him and we will  plan to get a lipid and liver profile on him in the mid March.  He will  be at increased risk for stent thrombosis due to bifurcation stenting in  the circumflex system and we will have him on Prasugrel to help minimize  his risk.     Bruce [name] Juanda Chance, MD, [facility]  Electronically Signed    BRB/MedQ  DD: 01/20/2009  DT: 01/21/2009  Job #: [id number]   cc:   [name], MD

## 2011-05-31 ENCOUNTER — Other Ambulatory Visit: Payer: Self-pay | Admitting: *Deleted

## 2011-05-31 MED ORDER — METFORMIN HCL 1000 MG PO TABS: 1000.0000 mg | ORAL_TABLET | Freq: Two times a day (BID) | ORAL | Status: DC

## 2011-06-14 ENCOUNTER — Other Ambulatory Visit: Payer: Self-pay | Admitting: *Deleted

## 2011-06-14 MED ORDER — PAROXETINE HCL 20 MG PO TABS: 40.0000 mg | ORAL_TABLET | Freq: Every day | ORAL | Status: DC

## 2011-06-14 NOTE — Telephone Encounter (Signed)
Rx refilled electronically °

## 2011-08-18 ENCOUNTER — Other Ambulatory Visit: Payer: Self-pay | Admitting: Cardiovascular Disease

## 2011-08-18 MED ORDER — PRASUGREL HCL 10 MG PO TABS: 10.0000 mg | ORAL_TABLET | Freq: Every day | ORAL | Status: DC

## 2011-08-18 MED ORDER — METOPROLOL SUCCINATE ER 25 MG PO TB24: 25.0000 mg | ORAL_TABLET | Freq: Every day | ORAL | Status: DC

## 2011-08-18 NOTE — Telephone Encounter (Signed)
Metoprolol 25 mg. Pharmacy need to discuss both medication.

## 2011-08-18 NOTE — Telephone Encounter (Signed)
Pt needs a Rx for effient 10mg  qd &  toprol XL 25mg  bid called into [facility] on [address] in [place] [phone]

## 2011-08-20 ENCOUNTER — Ambulatory Visit: Payer: 59 | Admitting: Internal Medicine

## 2011-08-30 ENCOUNTER — Encounter: Payer: Self-pay | Admitting: Internal Medicine

## 2011-08-30 ENCOUNTER — Ambulatory Visit (INDEPENDENT_AMBULATORY_CARE_PROVIDER_SITE_OTHER): Payer: PRIVATE HEALTH INSURANCE | Admitting: Internal Medicine

## 2011-08-30 VITALS — BP 140/84 | HR 69 | Temp 98.4°F | Ht 70.0 in | Wt 192.0 lb

## 2011-08-30 DIAGNOSIS — Z23 Encounter for immunization: Secondary | ICD-10-CM

## 2011-08-30 DIAGNOSIS — E785 Hyperlipidemia, unspecified: Secondary | ICD-10-CM

## 2011-08-30 DIAGNOSIS — E119 Type 2 diabetes mellitus without complications: Secondary | ICD-10-CM

## 2011-08-30 DIAGNOSIS — F329 Major depressive disorder, single episode, unspecified: Secondary | ICD-10-CM

## 2011-08-30 DIAGNOSIS — I10 Essential (primary) hypertension: Secondary | ICD-10-CM

## 2011-08-30 LAB — HEMOGLOBIN A1C: Hgb A1c MFr Bld: 7.4 % — ABNORMAL HIGH (ref 4.6–6.5)

## 2011-08-30 NOTE — Assessment & Plan Note (Signed)
BP Readings from Last 3 Encounters:  08/30/11 140/84  02/16/11 158/88  12/08/10 155/91   BP is better No changes

## 2011-08-30 NOTE — Progress Notes (Signed)
Subjective:    Patient ID: David [name], male    DOB: [birth year], 60 y.o.   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 aspirin 81 MG tablet Take 81 mg by mouth daily.        [name] glucose blood test strip Use as instructed  100 each  12  . losartan (COZAAR) 50 MG tablet Take 1 tablet (50 mg total) by mouth daily.  90 tablet  3  . meloxicam (MOBIC) 15 MG tablet Take 15 mg by mouth daily.        . metFORMIN (GLUCOPHAGE) 1000 MG tablet Take 1 tablet (1,000 mg total) by mouth 2 (two) times daily with a meal.  60 tablet  11  . metoprolol succinate (TOPROL-XL) 25 MG 24 hr tablet Take 1 tablet (25 mg total) by mouth daily.  30 tablet  6  . ONE TOUCH LANCETS MISC by Does not apply route as directed.        [name] PARoxetine (PAXIL) 20 MG tablet Take 2 tablets (40 mg total) by mouth daily. 2 tablets daily for depression  60 tablet  11  . prasugrel (EFFIENT) 10 MG TABS Take 1 tablet (10 mg total) by mouth daily.  30 tablet  6  . rosuvastatin (CRESTOR) 40 MG tablet Take 40 mg by mouth daily.        . sildenafil (VIAGRA) 100 MG tablet Take 0.5-1 tablets (50-100 mg total) by mouth as needed for erectile dysfunction (use about 30-60 minutes before sex).  10 tablet  1    No Known Allergies  Past Medical History  Diagnosis Date  . Allergy   . Depression   . Diabetes mellitus   . Arthritis   . Sleep apnea   . CAD (coronary artery disease)     s/p bifurcation stenting (DES x2) and stenting bifurcation lesion distal RCA (1 DES)  . Hyperlipemia     Past Surgical History  Procedure  Date  . Tonsillectomy 1959  . Coronary angioplasty with stent placement  March 2010    Dr. Juanda Chance    Family History  Problem Relation Age of Onset  . Heart failure Father     Deceased 17 y/o  . Diabetes Father   . Cancer Father     Prostate  . Heart failure Mother     Deceased 64 y/o  . Schizophrenia Son     Paranoid  . Uveitis Son   . Cancer Brother     prostate    History   Social History  . Marital Status: Married    Spouse Name: N/A    Number of Children: N/A  . Years of Education: N/A   Occupational History  . Not on file.   Social History Main Topics  . Smoking status: Former Smoker    Types: Cigarettes    Quit date: 11/30/1983  . Smokeless tobacco: Not on file  . Alcohol Use: Yes  . Drug Use: Not on file  . Sexually Active: Not on file   Other Topics Concern  . Not on file  Social History Narrative  . No narrative on file   Review of Systems Appetite is fine Weight down another 3# Sleeps okay     Objective:   Physical Exam  Constitutional: He appears well-developed and well-nourished. No distress.  Neck: Normal range of motion. Neck supple. No thyromegaly present.  Cardiovascular: Normal rate, regular rhythm, normal heart sounds and intact distal pulses.  Exam reveals no gallop.   No murmur heard. Pulmonary/Chest: Effort normal and breath sounds normal. No respiratory distress. He has no wheezes. He has no rales.  Musculoskeletal: Normal range of motion. He exhibits no edema and no tenderness.  Lymphadenopathy:    He has no cervical adenopathy.  Psychiatric: He has a normal mood and affect. His behavior is normal. Judgment and thought content normal.          Assessment & Plan:

## 2011-08-30 NOTE — Assessment & Plan Note (Signed)
Lab Results  Component Value Date   LDLCALC 49 02/16/2011   Very low Will cut the crestor in half

## 2011-08-30 NOTE — Assessment & Plan Note (Signed)
Seems to still have good control Will recheck   Lab Results  Component Value Date   HGBA1C 6.8* 02/16/2011

## 2011-08-30 NOTE — Assessment & Plan Note (Signed)
occ bad days but generally does okay Back at work though not a great job

## 2011-09-08 ENCOUNTER — Other Ambulatory Visit: Payer: Self-pay | Admitting: Internal Medicine

## 2011-09-08 ENCOUNTER — Other Ambulatory Visit: Payer: Self-pay

## 2011-09-08 DIAGNOSIS — R195 Other fecal abnormalities: Secondary | ICD-10-CM

## 2011-09-08 DIAGNOSIS — Z1211 Encounter for screening for malignant neoplasm of colon: Secondary | ICD-10-CM

## 2011-09-08 LAB — FECAL OCCULT BLOOD, IMMUNOCHEMICAL: Fecal Occult Bld: POSITIVE

## 2011-09-10 ENCOUNTER — Encounter: Payer: Self-pay | Admitting: Gastroenterology

## 2011-09-21 ENCOUNTER — Telehealth: Payer: Self-pay | Admitting: Cardiovascular Disease

## 2011-09-21 NOTE — Telephone Encounter (Signed)
Pt would like samples of Effient or if you have a discount card for this medication. Please call patient and let him know.

## 2011-09-21 NOTE — Telephone Encounter (Signed)
We do not have any samples or discount cards for Effient right now. Pt. Notified. Vikki Ports

## 2011-09-27 ENCOUNTER — Telehealth: Payer: Self-pay | Admitting: Cardiovascular Disease

## 2011-09-27 NOTE — Telephone Encounter (Signed)
Walk In Pt Form " Pt Dropped off Adult nurse Program paper" sent to Lauren/M.Cooper  09/27/11/km

## 2011-10-01 ENCOUNTER — Telehealth: Payer: Self-pay | Admitting: Cardiovascular Disease

## 2011-10-01 DIAGNOSIS — E7800 Pure hypercholesterolemia, unspecified: Secondary | ICD-10-CM

## 2011-10-01 NOTE — Telephone Encounter (Signed)
New message Pt wants generic med for crestor or switch to something else. Please call him back

## 2011-10-01 NOTE — Telephone Encounter (Signed)
Wants to change to generic med for Crestor because not on his ins plan and is too expensive

## 2011-10-01 NOTE — Telephone Encounter (Signed)
See note. Can you ask Dr. Excell Seltzer

## 2011-10-01 NOTE — Telephone Encounter (Signed)
Called pt to get more information.  Left message to call back.

## 2011-10-04 ENCOUNTER — Telehealth: Payer: Self-pay | Admitting: *Deleted

## 2011-10-04 ENCOUNTER — Ambulatory Visit (AMBULATORY_SURGERY_CENTER): Payer: PRIVATE HEALTH INSURANCE | Admitting: *Deleted

## 2011-10-04 VITALS — Ht 70.0 in | Wt 200.9 lb

## 2011-10-04 DIAGNOSIS — K921 Melena: Secondary | ICD-10-CM

## 2011-10-04 MED ORDER — PEG-KCL-NACL-NASULF-NA ASC-C 100 G PO SOLR: 1.0000 | Freq: Once | ORAL | Status: DC

## 2011-10-04 NOTE — Telephone Encounter (Signed)
Atorvastatin 40 mg is fine. If that's too expensive he can use zocor 40 mg.

## 2011-10-04 NOTE — Telephone Encounter (Signed)
Dr. Christella Hartigan, This pt is on Effient due to CABG in 2010.  He is having a colonoscopy on 10-18-11.  Do you want to see him in the office beforehand?  His cardiologist is Dr. Tonny Bollman.  I finished his pv anyway and told him I would be in touch with him re: Effient.  Please advice. Thank you,  David [name]

## 2011-10-05 ENCOUNTER — Telehealth: Payer: Self-pay | Admitting: Cardiovascular Disease

## 2011-10-05 ENCOUNTER — Encounter: Payer: Self-pay | Admitting: Gastroenterology

## 2011-10-05 NOTE — Telephone Encounter (Signed)
Pt brought by a form for assistance in getting Effient.  This was given to front desk on 10-29.  This has not been received.  Fax number was taken at drop off.  Also, patient needs Crestor called into [facility] on [address] in [place].  Please call patient and  Advise status. [phone]

## 2011-10-05 NOTE — Telephone Encounter (Signed)
David [name], can he hold his effient prior to colonoscopy?  I'm not sure how long it takes to wash out  (5 days?).  Thanks for your help.

## 2011-10-05 NOTE — Telephone Encounter (Signed)
Great, thanks [employer].  Patty, lets hold his effient for 7 days prior to a colonsocopy

## 2011-10-05 NOTE — Telephone Encounter (Signed)
I reviewed the patient's chart and his last PCI procedure was in 2010. He had bifurcation stenting of the left circumflex/obtuse marginal. Now that he is over 2 years out, he can come off of effient for his colonoscopy. Since he has had multiple coronary stents it would be best if he can stay on aspirin 81 mg daily. I would let his effient washout for 7 days as it is associated with more bleeding than Plavix and 7 days is the standard for patients who require cardiac surgery.

## 2011-10-05 NOTE — Telephone Encounter (Signed)
LMOM for pt to call back.

## 2011-10-06 NOTE — Telephone Encounter (Signed)
Pt rtn call [phone]

## 2011-10-06 NOTE — Telephone Encounter (Signed)
I left a message for the pt to call back about cholesterol medication and Effient assistance form.

## 2011-10-06 NOTE — Telephone Encounter (Signed)
Effient assistance form faxed to pt at [phone].

## 2011-10-06 NOTE — Telephone Encounter (Signed)
Pt returning your call

## 2011-10-06 NOTE — Telephone Encounter (Signed)
Spoke with pt and informed that Dr. Excell Seltzer wants him to hold his Effient 7 days before him procedure.  He is to continue his Aspirin 81mg  daily.  Understanding voiced.  His procedure is 10-18-11 and he knows to hold his Effient 7 days before his procedure

## 2011-10-06 NOTE — Telephone Encounter (Signed)
I left a message for the pt to call  back

## 2011-10-07 MED ORDER — ATORVASTATIN CALCIUM 40 MG PO TABS: 40.0000 mg | ORAL_TABLET | Freq: Every day | ORAL | Status: DC

## 2011-10-07 NOTE — Telephone Encounter (Signed)
I spoke with the pt and he did receive fax in regards to Effient assistance program.  The pt will also stop Crestor and switch to Atorvastatin 40mg  daily due to cost.  Rx sent to pharmacy.  If the pt cannot afford Atorvastatin then Dr Excell Seltzer would recommend Simvastatin 40mg  daily.

## 2011-10-07 NOTE — Telephone Encounter (Signed)
Pl return call to patient

## 2011-10-07 NOTE — Telephone Encounter (Signed)
Left message for pt to call back.  When the pt calls back I will speak with him.

## 2011-10-18 ENCOUNTER — Ambulatory Visit (AMBULATORY_SURGERY_CENTER): Payer: PRIVATE HEALTH INSURANCE | Admitting: Gastroenterology

## 2011-10-18 ENCOUNTER — Encounter: Payer: Self-pay | Admitting: Gastroenterology

## 2011-10-18 VITALS — BP 159/84 | HR 68 | Temp 97.9°F | Resp 18 | Ht 70.0 in | Wt 200.0 lb

## 2011-10-18 DIAGNOSIS — K921 Melena: Secondary | ICD-10-CM

## 2011-10-18 DIAGNOSIS — K5730 Diverticulosis of large intestine without perforation or abscess without bleeding: Secondary | ICD-10-CM

## 2011-10-18 DIAGNOSIS — D126 Benign neoplasm of colon, unspecified: Secondary | ICD-10-CM

## 2011-10-18 LAB — GLUCOSE, CAPILLARY
Glucose-Capillary: 124 mg/dL — ABNORMAL HIGH (ref 70–99)
Glucose-Capillary: 125 mg/dL — ABNORMAL HIGH (ref 70–99)

## 2011-10-18 MED ORDER — SODIUM CHLORIDE 0.9 % IV SOLN: 500.0000 mL | INTRAVENOUS | Status: DC

## 2011-10-18 NOTE — Patient Instructions (Addendum)
Restart your Effient TOMORROW.  PLEASE FOLLOW DISCHARGE INSTRUCTIONS GIVEN TODAY.  COLON POLYPS REMOVED AND SENT TO LAB, YOU WILL RECEIVE RESULT LETTER IN YOUR MAIL IN 1-2 WEEKS. DIVERTICULOSIS SEEN, TRY TO FOLLOW HIGH FIBER DIET. SEE HANDOUTS. CALL us WITH ANY QUESTIONS OR CONCERNS. THANK YOU!!

## 2011-10-18 NOTE — Progress Notes (Signed)
Patient did not experience any of the following events: a burn prior to discharge; a fall within the facility; wrong site/side/patient/procedure/implant event; or a hospital transfer or hospital admission upon discharge from the facility. (G8907) Patient did not have preoperative order for IV antibiotic SSI prophylaxis. (G8918)  

## 2011-10-19 ENCOUNTER — Telehealth: Payer: Self-pay

## 2011-10-19 NOTE — Telephone Encounter (Signed)
No ID on answering machine. 

## 2011-11-12 ENCOUNTER — Telehealth: Payer: Self-pay | Admitting: Internal Medicine

## 2011-11-12 NOTE — Telephone Encounter (Signed)
Spoke with patient and this was prescribed to him 02/11/2010, I advised pt he would need to be seen, he would like to wait to see what Dr.Letvak thinks, per pt these helped him last year and it's only for his cough from allergies. Pt knows Dr.Letvak is out until Monday.

## 2011-11-12 NOTE — Telephone Encounter (Signed)
Patient called and stated he was prescribed a cough medicine, Benzonatate and would like to know if he can get a new Rx called into his pharmacy.

## 2011-11-13 NOTE — Telephone Encounter (Signed)
Okay to Rx benzonatate 200mg  tid prn #60 x 0 He needs to be seen only if he is sick

## 2011-11-15 MED ORDER — BENZONATATE 200 MG PO CAPS: 200.0000 mg | ORAL_CAPSULE | Freq: Three times a day (TID) | ORAL | Status: DC | PRN

## 2011-11-15 NOTE — Telephone Encounter (Signed)
rx sent to pharmacy by e-script Spoke with patient and advised results   

## 2011-12-01 ENCOUNTER — Telehealth: Payer: Self-pay | Admitting: Cardiovascular Disease

## 2011-12-01 NOTE — Telephone Encounter (Signed)
new msg Pt wants to know about effient. He was supposed to be getting info from [employer]. Please call him back

## 2011-12-01 NOTE — Telephone Encounter (Signed)
David [name] is requesting a call from Dr Rosalyn Charters nurse regarding the status of his effient from [place] when she is back in the office.  He knows she is off this week.  21 days of samples were left at the front desk for David [name].

## 2011-12-02 NOTE — Telephone Encounter (Signed)
I spoke with [facility] and the pt's application was processed on 11/29/11.  The pt's medication will arrive in 7-10 business days.  I made the pt aware of this information.

## 2011-12-07 NOTE — Telephone Encounter (Signed)
Effient arrived in the office and placed at the front desk for pick-up.  Pt aware.  

## 2012-01-17 ENCOUNTER — Ambulatory Visit: Payer: PRIVATE HEALTH INSURANCE | Admitting: Cardiovascular Disease

## 2012-02-28 ENCOUNTER — Encounter: Payer: PRIVATE HEALTH INSURANCE | Admitting: Internal Medicine

## 2012-03-20 ENCOUNTER — Encounter: Payer: Self-pay | Admitting: Cardiovascular Disease

## 2012-03-20 ENCOUNTER — Ambulatory Visit (INDEPENDENT_AMBULATORY_CARE_PROVIDER_SITE_OTHER): Payer: Self-pay | Admitting: Cardiovascular Disease

## 2012-03-20 VITALS — BP 162/98 | HR 60 | Ht 70.0 in | Wt 202.1 lb

## 2012-03-20 DIAGNOSIS — I10 Essential (primary) hypertension: Secondary | ICD-10-CM

## 2012-03-20 DIAGNOSIS — E785 Hyperlipidemia, unspecified: Secondary | ICD-10-CM

## 2012-03-20 DIAGNOSIS — I2510 Atherosclerotic heart disease of native coronary artery without angina pectoris: Secondary | ICD-10-CM

## 2012-03-20 MED ORDER — LOSARTAN POTASSIUM 100 MG PO TABS: 100.0000 mg | ORAL_TABLET | Freq: Every day | ORAL | Status: DC

## 2012-03-20 NOTE — Assessment & Plan Note (Signed)
Blood pressure is elevated today and is clearly above goal. I recommended that he double losartan so that his new dose will be 100 mg daily. He will need continued followup of this problem.

## 2012-03-20 NOTE — Patient Instructions (Signed)
Your physician has recommended you make the following change in your medication: INCREASE Losartan to 100mg  take one by mouth daily,  We will switch you from Effient to Plavix when your patient assistance program ends (December 2013)  Your physician wants you to follow-up in: 1 YEAR.  You will receive a reminder letter in the mail two months in advance. If you don't receive a letter, please call our office to schedule the follow-up appointment.

## 2012-03-20 NOTE — Assessment & Plan Note (Signed)
The patient is stable without anginal symptoms. He remains on dual antiplatelet therapy with aspirin and effient. I would favor that he continue long-term goal antiplatelet therapy as he has had 2 separate bifurcation lesions treated. It may be best eventually to switch him to a combination of aspirin and Plavix. His effient assistants will probably run out in December of this year and at that point we will consider changing to Plavix. Otherwise he will continue on his same medicines. He knows to contact me if angina recurs. Otherwise I will see him back in 12 months.

## 2012-03-20 NOTE — Progress Notes (Signed)
   HPI:  61 year old gentleman presenting for followup evaluation. He underwent complex PCI of 2 separate bifurcation lesions in 2010. The first lesion involved and obtuse marginal bifurcation of the left circumflex and the second lesion was treated with a staged PCI procedure involving the distal right coronary artery bifurcation. He has been maintained on long-term aspirin and effient.  Overall the patient is doing well. Prior to his coronary interventional procedures, he had exertional angina. He has had no recurrence since that time. He specifically denies chest pain or chest pressure with activity. He denies dyspnea, edema, palpitations, lightheadedness, or syncope. He has been compliant with his medications. He does pay for his medicines out of pocket as he has no health insurance. He has been able to get effient through their assistance program.  Outpatient Encounter Prescriptions as of 03/20/2012  Medication Sig Dispense Refill  . aspirin 81 MG tablet Take 81 mg by mouth daily.        [name] atorvastatin (LIPITOR) 40 MG tablet Take 1 tablet (40 mg total) by mouth daily.  30 tablet  11  . metFORMIN (GLUCOPHAGE) 1000 MG tablet Take 1 tablet (1,000 mg total) by mouth 2 (two) times daily with a meal.  60 tablet  11  . metoprolol succinate (TOPROL-XL) 25 MG 24 hr tablet Take 1 tablet (25 mg total) by mouth daily.  30 tablet  6  . NITROGLYCERIN SL Place under the tongue as needed.        . ONE TOUCH LANCETS MISC by Does not apply route as directed.        [name] PARoxetine (PAXIL) 20 MG tablet Take 2 tablets (40 mg total) by mouth daily. 2 tablets daily for depression  60 tablet  11  . prasugrel (EFFIENT) 10 MG TABS Take 1 tablet (10 mg total) by mouth daily.  30 tablet  6  . DISCONTD: benzonatate (TESSALON) 200 MG capsule Take 1 capsule (200 mg total) by mouth 3 (three) times daily as needed.  60 capsule  0  . losartan (COZAAR) 50 MG tablet Take 1 tablet (50 mg total) by mouth daily.  90 tablet  3  .  sildenafil (VIAGRA) 100 MG tablet Take 0.5-1 tablets (50-100 mg total) by mouth as needed for erectile dysfunction (use about 30-60 minutes before sex).  10 tablet  1    No Known Allergies  Past Medical History  Diagnosis Date  . Allergy   . Depression   . Diabetes mellitus   . Arthritis   . Sleep apnea     CPAP  . CAD (coronary artery disease)     s/p bifurcation stenting (DES x2) and stenting bifurcation lesion distal RCA (1 DES)  . Hyperlipemia   . Hypertension     ROS: Negative except as per HPI  BP 162/98  Pulse 60  Ht 5\' 10"  (1.778 m)  Wt 91.681 kg (202 lb 1.9 oz)  BMI 29.00 kg/m2  PHYSICAL EXAM: Pt is alert and oriented, NAD HEENT: normal Neck: JVP - normal, carotids 2+= without bruits Lungs: CTA bilaterally CV: RRR without murmur or gallop Abd: soft, NT, Positive BS, no hepatomegaly Ext: no C/C/E, distal pulses intact and equal Skin: warm/dry no rash  EKG:  Normal sinus rhythm, 60 beats per minute, within normal limits.  ASSESSMENT AND PLAN:

## 2012-03-20 NOTE — Assessment & Plan Note (Signed)
Lipids have been at goal. His last LDL is less than 70 and he is due for followup labs. I discussed consideration of the ACCELERATE trial and he is interested in pursuing this. If he enters the trial, his lipids will be drawn through the study protocol.

## 2012-05-11 ENCOUNTER — Other Ambulatory Visit: Payer: Self-pay | Admitting: Cardiovascular Disease

## 2012-05-30 ENCOUNTER — Other Ambulatory Visit: Payer: Self-pay | Admitting: Internal Medicine

## 2012-07-03 ENCOUNTER — Other Ambulatory Visit: Payer: Self-pay | Admitting: Internal Medicine

## 2012-07-05 ENCOUNTER — Other Ambulatory Visit: Payer: Self-pay | Admitting: Internal Medicine

## 2012-07-06 ENCOUNTER — Other Ambulatory Visit: Payer: Self-pay

## 2012-07-06 NOTE — Telephone Encounter (Signed)
Pt was told at [facility] [address] had not received refill for paxil. I spoke with Tamika at [facility]; refill was received and will be ready for pick up 1 hr. Pt notified while on phone.

## 2012-07-11 ENCOUNTER — Other Ambulatory Visit: Payer: Self-pay

## 2012-07-11 MED ORDER — METFORMIN HCL 1000 MG PO TABS: 1000.0000 mg | ORAL_TABLET | Freq: Two times a day (BID) | ORAL | Status: DC

## 2012-07-11 NOTE — Telephone Encounter (Signed)
Pt request refill Metformin; has appt scheduled cpx 09/18/12. Pt notified refilled while on phone.

## 2012-07-26 ENCOUNTER — Telehealth: Payer: Self-pay | Admitting: Cardiovascular Disease

## 2012-07-26 NOTE — Telephone Encounter (Signed)
Pt calling re effient

## 2012-07-26 NOTE — Telephone Encounter (Signed)
Reorder form faxed to [facility].

## 2012-07-26 NOTE — Telephone Encounter (Signed)
Spoke with pt, he needs his effient ordered from [facility]. Will order for pt and we will call him when it arrives.

## 2012-08-07 ENCOUNTER — Other Ambulatory Visit: Payer: Self-pay | Admitting: Internal Medicine

## 2012-08-14 ENCOUNTER — Telehealth: Payer: Self-pay | Admitting: Cardiovascular Disease

## 2012-08-14 NOTE — Telephone Encounter (Signed)
Pt is almost out of effient and he left a message a couple of week ago and still has not heard anything

## 2012-08-15 NOTE — Telephone Encounter (Signed)
Medication arrived in the office on 08/14/12 and placed at the front desk for pick-up.  I left a message on the pt's voicemail to make him aware that Effient has arrived. Order #[id number]

## 2012-09-05 ENCOUNTER — Encounter: Payer: Self-pay | Admitting: Internal Medicine

## 2012-09-06 ENCOUNTER — Other Ambulatory Visit: Payer: Self-pay | Admitting: Internal Medicine

## 2012-09-18 ENCOUNTER — Encounter: Payer: Self-pay | Admitting: Internal Medicine

## 2012-09-18 ENCOUNTER — Ambulatory Visit (INDEPENDENT_AMBULATORY_CARE_PROVIDER_SITE_OTHER): Payer: Self-pay | Admitting: Internal Medicine

## 2012-09-18 VITALS — BP 140/80 | HR 79 | Temp 98.2°F | Ht 69.0 in | Wt 199.0 lb

## 2012-09-18 DIAGNOSIS — E785 Hyperlipidemia, unspecified: Secondary | ICD-10-CM

## 2012-09-18 DIAGNOSIS — Z0000 Encounter for general adult medical examination without abnormal findings: Secondary | ICD-10-CM

## 2012-09-18 DIAGNOSIS — F329 Major depressive disorder, single episode, unspecified: Secondary | ICD-10-CM

## 2012-09-18 DIAGNOSIS — I10 Essential (primary) hypertension: Secondary | ICD-10-CM

## 2012-09-18 DIAGNOSIS — F3289 Other specified depressive episodes: Secondary | ICD-10-CM

## 2012-09-18 DIAGNOSIS — Z23 Encounter for immunization: Secondary | ICD-10-CM

## 2012-09-18 DIAGNOSIS — E119 Type 2 diabetes mellitus without complications: Secondary | ICD-10-CM

## 2012-09-18 LAB — LIPID PANEL
Cholesterol: 214 mg/dL — ABNORMAL HIGH (ref 0–200)
HDL: 29.3 mg/dL — ABNORMAL LOW (ref >39.00)
Total CHOL/HDL Ratio: 7
Triglycerides: 371 mg/dL — ABNORMAL HIGH (ref 0.0–149.0)
VLDL: 74.2 mg/dL — ABNORMAL HIGH (ref 0.0–40.0)

## 2012-09-18 LAB — BASIC METABOLIC PANEL
CO2: 26 mEq/L (ref 19–32)
Calcium: 9.3 mg/dL (ref 8.4–10.5)
Creatinine, Ser: 1.1 mg/dL (ref 0.4–1.5)
GFR: 70.27 mL/min (ref >60.00)

## 2012-09-18 LAB — LDL CHOLESTEROL, DIRECT: Direct LDL: 123.2 mg/dL

## 2012-09-18 LAB — HEPATIC FUNCTION PANEL
Bilirubin, Direct: 0 mg/dL (ref 0.0–0.3)
Total Protein: 7.3 g/dL (ref 6.0–8.3)

## 2012-09-18 MED ORDER — TRIAMCINOLONE ACETONIDE 0.1 % EX CREA: TOPICAL_CREAM | Freq: Two times a day (BID) | CUTANEOUS | Status: DC | PRN

## 2012-09-18 NOTE — Assessment & Plan Note (Signed)
Control has worsened He will work harder on fitness and eating right Add glipizide if up close to 9%

## 2012-09-18 NOTE — Assessment & Plan Note (Signed)
BP Readings from Last 3 Encounters:  09/18/12 140/80  03/20/12 162/98  10/18/11 159/84   Acceptable now on increased losartan Due for labs

## 2012-09-18 NOTE — Assessment & Plan Note (Signed)
Will defer PSA after discussion Tdap next time--will have insurance

## 2012-09-18 NOTE — Assessment & Plan Note (Signed)
Due for labs since put on atorvastatin

## 2012-09-18 NOTE — Progress Notes (Signed)
Subjective:    Patient ID: David [name], male    DOB: November 20, 1951, 61 y.o.   MRN: [id number]  HPI Here for physical No new medical concerns  No problems with heart  Checks sugars twice a week Has been high lately-- fasting at 200 Feels he could be better with his eating  Mood is variable Down at times but nothing persistent---family and financial issues May last all day but not more than 1 day  Current Outpatient Prescriptions on File Prior to Visit  Medication Sig Dispense Refill  . aspirin 81 MG tablet Take 81 mg by mouth daily.        [name] atorvastatin (LIPITOR) 40 MG tablet Take 1 tablet (40 mg total) by mouth daily.  30 tablet  11  . losartan (COZAAR) 100 MG tablet Take 1 tablet (100 mg total) by mouth daily.  30 tablet  11  . metFORMIN (GLUCOPHAGE) 1000 MG tablet TAKE ONE TABLET BY MOUTH TWICE DAILY WITH A MEAL  60 tablet  0  . metoprolol succinate (TOPROL-XL) 25 MG 24 hr tablet TAKE ONE TABLET BY MOUTH EVERY DAY  30 tablet  6  . NITROGLYCERIN SL Place under the tongue as needed.        . ONE TOUCH LANCETS MISC by Does not apply route as directed.        [name] PARoxetine (PAXIL) 20 MG tablet TAKE TWO TABLETS BY MOUTH EVERY DAY FOR DEPRESSION  60 tablet  0  . prasugrel (EFFIENT) 10 MG TABS Take 1 tablet (10 mg total) by mouth daily.  30 tablet  6  . DISCONTD: sildenafil (VIAGRA) 100 MG tablet Take 0.5-1 tablets (50-100 mg total) by mouth as needed for erectile dysfunction (use about 30-60 minutes before sex).  10 tablet  1    No Known Allergies  Past Medical History  Diagnosis Date  . Allergy   . Depression   . Diabetes mellitus   . Arthritis   . Sleep apnea     CPAP  . CAD (coronary artery disease)     s/p bifurcation stenting (DES x2) and stenting bifurcation lesion distal RCA (1 DES)  . Hyperlipemia   . Hypertension     Past Surgical History  Procedure Date  . Tonsillectomy 1959  . Coronary angioplasty with stent placement  March 2010    Dr. Juanda Chance    Family  History  Problem Relation Age of Onset  . Heart failure Father     Deceased 52 y/o  . Diabetes Father   . Cancer Father     Prostate  . Heart failure Mother     Deceased 5 y/o  . Schizophrenia Son     Paranoid  . Uveitis Son   . Cancer Brother     prostate  . Colon cancer Neg Hx   . Esophageal cancer Neg Hx   . Stomach cancer Neg Hx     History   Social History  . Marital Status: Married    Spouse Name: N/A    Number of Children: N/A  . Years of Education: N/A   Occupational History  . Designer, multimedia buildings    Social History Main Topics  . Smoking status: Former Smoker    Types: Cigarettes    Quit date: 11/30/1983  . Smokeless tobacco: Never Used  . Alcohol Use: 2.5 oz/week    5 drink(s) per week  . Drug Use: Not on file  . Sexually Active: Not on file   Other Topics Concern  .  Not on file   Social History Narrative  . No narrative on file   Review of Systems  Constitutional: Negative for fatigue and unexpected weight change.       Weight down 3# Wears seat belt  HENT: Positive for hearing loss, congestion and rhinorrhea. Negative for tinnitus.        Mild hearing loss Regular with dentist Mild allergy symptoms  Eyes: Negative for visual disturbance.       No diplopia or unilateral vision loss Recent eye exam  Respiratory: Negative for cough, chest tightness and shortness of breath.   Cardiovascular: Positive for palpitations. Negative for chest pain and leg swelling.       Occ at night in bed he notices more rapid heartbeat  Gastrointestinal: Negative for nausea, vomiting, abdominal pain, constipation and blood in stool.       No heartburn  Genitourinary: Positive for urgency, frequency and difficulty urinating.       Nocturia x 4-5 No daytime problems No sex--no problem  Musculoskeletal: Negative for back pain, joint swelling and arthralgias.  Skin: Positive for rash.       Sporadic psoriasis problems---used topical med in past Some pits on  thumb nails also  Neurological: Negative for dizziness, syncope, weakness, light-headedness, numbness and headaches.  Hematological: Negative for adenopathy. Bruises/bleeds easily.  Psychiatric/Behavioral: Positive for dysphoric mood. Negative for disturbed wake/sleep cycle. The patient is not nervous/anxious.        Objective:   Physical Exam  Constitutional: He is oriented to person, place, and time. He appears well-developed and well-nourished. No distress.  HENT:  Head: Normocephalic and atraumatic.  Right Ear: External ear normal.  Left Ear: External ear normal.  Mouth/Throat: Oropharynx is clear and moist. No oropharyngeal exudate.  Eyes: Conjunctivae normal and EOM are normal. Pupils are equal, round, and reactive to light.  Neck: Normal range of motion. Neck supple. No thyromegaly present.  Cardiovascular: Normal rate, regular rhythm, normal heart sounds and intact distal pulses.  Exam reveals no gallop.   No murmur heard. Pulmonary/Chest: Effort normal and breath sounds normal. No respiratory distress. He has no wheezes. He has no rales.  Abdominal: Soft. There is no tenderness.  Musculoskeletal: He exhibits no edema and no tenderness.  Lymphadenopathy:    He has no cervical adenopathy.  Neurological: He is alert and oriented to person, place, and time.  Skin: Rash noted. No erythema.       Small psoriatic plaques scattered  Psychiatric: He has a normal mood and affect. His behavior is normal. Thought content normal.          Assessment & Plan:

## 2012-09-18 NOTE — Assessment & Plan Note (Signed)
Sporadic bad days but no persistent problems Will continue the med

## 2012-09-29 ENCOUNTER — Telehealth: Payer: Self-pay | Admitting: Internal Medicine

## 2012-10-05 ENCOUNTER — Encounter: Payer: Self-pay | Admitting: *Deleted

## 2012-10-09 ENCOUNTER — Other Ambulatory Visit: Payer: Self-pay | Admitting: Internal Medicine

## 2012-10-30 ENCOUNTER — Other Ambulatory Visit: Payer: Self-pay | Admitting: Cardiovascular Disease

## 2012-11-01 NOTE — Telephone Encounter (Signed)
error 

## 2012-11-23 ENCOUNTER — Telehealth: Payer: Self-pay | Admitting: Cardiovascular Disease

## 2012-11-23 NOTE — Telephone Encounter (Signed)
Pt would like a return call to discuss effient refill.

## 2012-11-28 MED ORDER — PRASUGREL HCL 10 MG PO TABS: 10.0000 mg | ORAL_TABLET | Freq: Every day | ORAL | Status: DC

## 2012-12-11 ENCOUNTER — Other Ambulatory Visit: Payer: Self-pay | Admitting: Cardiovascular Disease

## 2012-12-14 ENCOUNTER — Telehealth: Payer: Self-pay | Admitting: Cardiovascular Disease

## 2012-12-14 NOTE — Telephone Encounter (Signed)
Spoke to patient he stated he needed Lauren to send form in to [facility] for his effient.Patient was told office out of effient samples.Message sent to Lauren.

## 2012-12-14 NOTE — Telephone Encounter (Signed)
New problem:    Patient is aware that David [name] is off today an tomorrow.    Patient is on assistance program get his effient 10 mg  through [employer] .  Patient on 7 days supply. This need to call in to the company  not the pharmacy.

## 2012-12-18 ENCOUNTER — Other Ambulatory Visit: Payer: Self-pay

## 2012-12-18 NOTE — Telephone Encounter (Signed)
I left the pt a message to contact the office to discuss his Effient application.  The pt is due to complete his application again for assistance program.

## 2012-12-18 NOTE — Telephone Encounter (Signed)
Pt rtn call to lauren [phone]

## 2012-12-18 NOTE — Telephone Encounter (Signed)
Left message to call back  

## 2012-12-18 NOTE — Telephone Encounter (Signed)
I spoke with the pt and he will fax a new application into the office. At this time we do not have samples of Effient.  The pt has a week supply of medication.  Will give pt samples when they are available.

## 2012-12-20 NOTE — Telephone Encounter (Signed)
I left the pt a message that I have placed a 28 day supply of Effient at the front desk for pick-up.  I also have not received the pt's application for assistance program and asked him to bring application into the office when he picks up samples.

## 2013-01-16 NOTE — Telephone Encounter (Signed)
Effient arrived in the office and placed at the front desk for pick-up.  Pt aware.  

## 2013-03-04 ENCOUNTER — Other Ambulatory Visit: Payer: Self-pay | Admitting: Cardiovascular Disease

## 2013-03-19 ENCOUNTER — Ambulatory Visit: Payer: Self-pay | Admitting: Internal Medicine

## 2013-04-01 ENCOUNTER — Other Ambulatory Visit: Payer: Self-pay | Admitting: Cardiovascular Disease

## 2013-04-16 ENCOUNTER — Encounter: Payer: Self-pay | Admitting: Internal Medicine

## 2013-04-16 ENCOUNTER — Ambulatory Visit (INDEPENDENT_AMBULATORY_CARE_PROVIDER_SITE_OTHER): Payer: BC Managed Care – PPO | Admitting: Internal Medicine

## 2013-04-16 VITALS — BP 120/80 | HR 62 | Temp 97.8°F | Wt 204.0 lb

## 2013-04-16 DIAGNOSIS — F329 Major depressive disorder, single episode, unspecified: Secondary | ICD-10-CM

## 2013-04-16 DIAGNOSIS — IMO0001 Reserved for inherently not codable concepts without codable children: Secondary | ICD-10-CM

## 2013-04-16 DIAGNOSIS — I10 Essential (primary) hypertension: Secondary | ICD-10-CM

## 2013-04-16 DIAGNOSIS — E785 Hyperlipidemia, unspecified: Secondary | ICD-10-CM

## 2013-04-16 LAB — LIPID PANEL
LDL Cholesterol: 64 mg/dL (ref 0–99)
Total CHOL/HDL Ratio: 3

## 2013-04-16 LAB — HEPATIC FUNCTION PANEL
Albumin: 4.2 g/dL (ref 3.5–5.2)
Alkaline Phosphatase: 76 U/L (ref 39–117)
Bilirubin, Direct: 0 mg/dL (ref 0.0–0.3)
Total Protein: 7.5 g/dL (ref 6.0–8.3)

## 2013-04-16 LAB — HEMOGLOBIN A1C: Hgb A1c MFr Bld: 9 % — ABNORMAL HIGH (ref 4.6–6.5)

## 2013-04-16 MED ORDER — GLUCOSE BLOOD VI STRP: 1.0000 | ORAL_STRIP | Freq: Every day | Status: DC

## 2013-04-16 NOTE — Addendum Note (Signed)
Addended by: Sueanne Margarita on: 04/16/2013 01:08 PM   Modules accepted: Orders

## 2013-04-16 NOTE — Assessment & Plan Note (Signed)
Discussed better care with diet and increased aerobic exercise If A1c still over 8%, add glipizide (he doesn't want insulin now) If still not controlled at PE, referral to endocrine

## 2013-04-16 NOTE — Assessment & Plan Note (Signed)
BP Readings from Last 3 Encounters:  04/16/13 120/80  09/18/12 140/80  03/20/12 162/98   Good control No changes

## 2013-04-16 NOTE — Assessment & Plan Note (Signed)
Wife concerned but he thinks he is okay Discussed more exercise Needs visit with her if need to consider more meds

## 2013-04-16 NOTE — Progress Notes (Signed)
Subjective:    Patient ID: David [name], male    DOB: [birth year], 62 y.o.   MRN: [id number]  HPI Doing okay  Has tried to be more careful with eating Checks sugars infrequently 180-200 fasting Would accept another pill--but no insulin   Had not been taking the atorvastatin due to insurance issues Back on for about a month  No chest pain No SOB No dizziness or syncope Stamina with aerobic work is   Wife concerned he is "moping around more" Thinks he needs a higher dose of med  Current Outpatient Prescriptions on File Prior to Visit  Medication Sig Dispense Refill  . aspirin 81 MG tablet Take 81 mg by mouth daily.        [name] atorvastatin (LIPITOR) 40 MG tablet TAKE ONE TABLET BY MOUTH EVERY DAY  30 tablet  3  . losartan (COZAAR) 100 MG tablet TAKE ONE TABLET BY MOUTH EVERY DAY  30 tablet  2  . metFORMIN (GLUCOPHAGE) 1000 MG tablet TAKE ONE TABLET BY MOUTH TWICE DAILY WITH FOOD  60 tablet  11  . metoprolol succinate (TOPROL-XL) 25 MG 24 hr tablet TAKE ONE TABLET BY MOUTH EVERY DAY  30 tablet  6  . NITROGLYCERIN SL Place under the tongue as needed.        . ONE TOUCH LANCETS MISC by Does not apply route as directed.        [name] PARoxetine (PAXIL) 20 MG tablet TAKE TWO TABLETS BY MOUTH EVERY DAY FOR  DEPRESSION  60 tablet  11  . prasugrel (EFFIENT) 10 MG TABS Take 1 tablet (10 mg total) by mouth daily.  30 tablet  6  . sildenafil (VIAGRA) 100 MG tablet Take 50-100 mg by mouth as needed.      . triamcinolone cream (KENALOG) 0.1 % Apply topically 2 (two) times daily as needed. For psoriasis  45 g  2   No current facility-administered medications on file prior to visit.    No Known Allergies  Past Medical History  Diagnosis Date  . Allergy   . Depression   . Diabetes mellitus   . Arthritis   . Sleep apnea     CPAP  . CAD (coronary artery disease)     s/p bifurcation stenting (DES x2) and stenting bifurcation lesion distal RCA (1 DES)  . Hyperlipemia   . Hypertension      Past Surgical History  Procedure Laterality Date  . Tonsillectomy  1959  . Coronary angioplasty with stent placement   March 2010    Dr. Juanda Chance    Family History  Problem Relation Age of Onset  . Heart failure Father     Deceased 1 y/o  . Diabetes Father   . Cancer Father     Prostate  . Heart failure Mother     Deceased 30 y/o  . Schizophrenia Son     Paranoid  . Uveitis Son   . Cancer Brother     prostate  . Colon cancer Neg Hx   . Esophageal cancer Neg Hx   . Stomach cancer Neg Hx     History   Social History  . Marital Status: Married    Spouse Name: N/A    Number of Children: N/A  . Years of Education: N/A   Occupational History  . Designer, multimedia buildings    Social History Main Topics  . Smoking status: Former Smoker    Types: Cigarettes    Quit date: 11/30/1983  . Smokeless tobacco:  Never Used  . Alcohol Use: 2.5 oz/week    5 drink(s) per week  . Drug Use: Not on file  . Sexually Active: Not on file   Other Topics Concern  . Not on file   Social History Narrative  . No narrative on file   Review of Systems Weight up 5# Generally sleeps okay Some spotty exercise--cardio and strength once a week each Face stays red--wonders if that is from any of his meds    Objective:   Physical Exam  Constitutional: He appears well-developed and well-nourished. No distress.  Neck: Normal range of motion. Neck supple. No thyromegaly present.  Cardiovascular: Normal rate and normal heart sounds.  Exam reveals no gallop.   No murmur heard. Pulmonary/Chest: Effort normal and breath sounds normal. No respiratory distress. He has no wheezes. He has no rales.  Musculoskeletal: He exhibits no edema and no tenderness.  Lymphadenopathy:    He has no cervical adenopathy.  Psychiatric: He has a normal mood and affect. His behavior is normal.          Assessment & Plan:

## 2013-04-16 NOTE — Assessment & Plan Note (Signed)
Back on his statin Will check labs

## 2013-04-24 ENCOUNTER — Ambulatory Visit (INDEPENDENT_AMBULATORY_CARE_PROVIDER_SITE_OTHER): Payer: BC Managed Care – PPO | Admitting: Cardiovascular Disease

## 2013-04-24 ENCOUNTER — Encounter: Payer: Self-pay | Admitting: Cardiovascular Disease

## 2013-04-24 VITALS — BP 138/81 | HR 64 | Ht 70.0 in | Wt 203.0 lb

## 2013-04-24 DIAGNOSIS — I2510 Atherosclerotic heart disease of native coronary artery without angina pectoris: Secondary | ICD-10-CM

## 2013-04-24 DIAGNOSIS — E785 Hyperlipidemia, unspecified: Secondary | ICD-10-CM

## 2013-04-24 DIAGNOSIS — I10 Essential (primary) hypertension: Secondary | ICD-10-CM

## 2013-04-24 MED ORDER — NITROGLYCERIN 0.4 MG SL SUBL: 0.4000 mg | SUBLINGUAL_TABLET | SUBLINGUAL | Status: DC | PRN

## 2013-04-24 MED ORDER — CLOPIDOGREL BISULFATE 75 MG PO TABS: 75.0000 mg | ORAL_TABLET | Freq: Every day | ORAL | Status: DC

## 2013-04-24 NOTE — Progress Notes (Signed)
HPI:   62 year old gentleman presenting for followup evaluation. He underwent complex PCI in 2010 involving the left circumflex/obtuse marginal bifurcation as well as the distal right coronary artery bifurcation. He's been maintained on long-term aspirin and effient.   He's had no recurrent chest pain since his PCI procedure. He denies dyspnea, palpitations, or edema. He bruises easily ever since taking aspirin and effient. He has no other complaints today. He is compliant with his medical program. His diabetes has not been as well controlled and he is about to start another oral hypoglycemic agent.  Outpatient Encounter Prescriptions as of 04/24/2013  Medication Sig Dispense Refill  . aspirin 81 MG tablet Take 81 mg by mouth daily.        [name] atorvastatin (LIPITOR) 40 MG tablet TAKE ONE TABLET BY MOUTH EVERY DAY  30 tablet  3  . losartan (COZAAR) 100 MG tablet TAKE ONE TABLET BY MOUTH EVERY DAY  30 tablet  2  . metFORMIN (GLUCOPHAGE) 1000 MG tablet TAKE ONE TABLET BY MOUTH TWICE DAILY WITH FOOD  60 tablet  11  . metoprolol succinate (TOPROL-XL) 25 MG 24 hr tablet TAKE ONE TABLET BY MOUTH EVERY DAY  30 tablet  6  . NITROGLYCERIN SL Place under the tongue as needed.        [name] PARoxetine (PAXIL) 20 MG tablet TAKE TWO TABLETS BY MOUTH EVERY DAY FOR  DEPRESSION  60 tablet  11  . prasugrel (EFFIENT) 10 MG TABS Take 1 tablet (10 mg total) by mouth daily.  30 tablet  6  . sildenafil (VIAGRA) 100 MG tablet Take 50-100 mg by mouth as needed.      . triamcinolone cream (KENALOG) 0.1 % Apply topically 2 (two) times daily as needed. For psoriasis  45 g  2  . glucose blood (ONE TOUCH TEST STRIPS) test strip 1 each by Other route daily. Dx: 250.02  100 each  1  . ONE TOUCH LANCETS MISC by Does not apply route as directed.         No facility-administered encounter medications on file as of 04/24/2013.    No Known Allergies  Past Medical History  Diagnosis Date  . Allergy   . Depression   . Diabetes  mellitus   . Arthritis   . Sleep apnea     CPAP  . CAD (coronary artery disease)     s/p bifurcation stenting (DES x2) and stenting bifurcation lesion distal RCA (1 DES)  . Hyperlipemia   . Hypertension     ROS: Negative except as per HPI  BP 138/81  Pulse 64  Ht 5\' 10"  (1.778 m)  Wt 92.08 kg (203 lb)  BMI 29.13 kg/m2  PHYSICAL EXAM: Pt is alert and oriented, NAD HEENT: normal Neck: JVP - normal, carotids 2+= without bruits Lungs: CTA bilaterally CV: RRR without murmur or gallop Abd: soft, NT, Positive BS, no hepatomegaly Ext: no C/C/E, distal pulses intact and equal Skin: warm/dry no rash  EKG:  Sinus bradycardia 56 beats per minute, otherwise within normal limits.  ASSESSMENT AND PLAN: 1. Coronary artery disease, native vessel. He is now 4 years out from his PCI procedure. We discussed the risk/benefit of continue dual antiplatelet therapy. It is important to note that he had to bifurcation lesions treated. I think as a long-term strategy it would be reasonable for him to continue on aspirin and Plavix. I've asked him to stop effient and he will start Plavix 75 mg daily. It probably makes more sense  at this point to be on a less potent agent to reduce bleeding risk. He will notify me if there is any recurrence of his angina.  2. Hyperlipidemia. He continues on atorvastatin. We discussed the importance of weight loss and exercise. recent cholesterol panel showed an LDL of 64, HDL 40, total cholesterol 578, and triglycerides 100. LFTs were mildly elevated and unchanged from previous.   3. Essential hypertension. Blood pressure is controlled on a combination of losartan and metoprolol.   4. Diabetes, type II. Followed by Dr. [name].

## 2013-04-24 NOTE — Patient Instructions (Addendum)
Your physician has recommended you make the following change in your medication: STOP Effient, START Plavix 75mg  take one by mouth daily  Your physician has requested that you have an exercise tolerance test with PA/NP. For further information please visit [email or web address] Please also follow instruction sheet, as given.  Your physician wants you to follow-up in: 1 YEAR with Dr Excell Seltzer.  You will receive a reminder letter in the mail two months in advance. If you don't receive a letter, please call our office to schedule the follow-up appointment.

## 2013-04-27 ENCOUNTER — Other Ambulatory Visit: Payer: Self-pay | Admitting: *Deleted

## 2013-04-27 MED ORDER — GLIPIZIDE 5 MG PO TABS: 5.0000 mg | ORAL_TABLET | Freq: Every day | ORAL | Status: DC

## 2013-05-21 ENCOUNTER — Ambulatory Visit (INDEPENDENT_AMBULATORY_CARE_PROVIDER_SITE_OTHER): Payer: BC Managed Care – PPO | Admitting: Physician Assistant

## 2013-05-21 DIAGNOSIS — E785 Hyperlipidemia, unspecified: Secondary | ICD-10-CM

## 2013-05-21 DIAGNOSIS — I2510 Atherosclerotic heart disease of native coronary artery without angina pectoris: Secondary | ICD-10-CM

## 2013-05-21 DIAGNOSIS — I10 Essential (primary) hypertension: Secondary | ICD-10-CM

## 2013-05-21 NOTE — Progress Notes (Signed)
Exercise Treadmill Test  Pre-Exercise Testing Evaluation Rhythm: normal sinus  Rate: 69     Test  Exercise Tolerance Test Ordering MD: Tonny Bollman, MD  Interpreting MD: Tereso Newcomer, PA-C  Unique Test No: 1  Treadmill:  1  Indication for ETT: known ASHD  Contraindication to ETT: No   Stress Modality: exercise - treadmill  Cardiac Imaging Performed: non   Protocol: standard Bruce - maximal  Max BP:  203/87  Max MPHR (bpm):  156 85% MPR (bpm):  133  MPHR obtained (bpm):  151 % MPHR obtained:  97  Reached 85% MPHR (min:sec):  4:20 Total Exercise Time (min-sec):  9:31  Workload in METS:  10.9 Borg Scale: 18  Reason ETT Terminated:  patient's desire to stop    ST Segment Analysis At Rest: normal ST segments - no evidence of significant ST depression With Exercise: no evidence of significant ST depression  Other Information Arrhythmia:  No Angina during ETT:  absent (0) Quality of ETT:  diagnostic  ETT Interpretation:  normal - no evidence of ischemia by ST analysis  Comments: Good exercise tolerance. No chest pain. Normal BP response to exercise. No ST-T changes to suggest ischemia.   Recommendations: F/u with Dr. Tonny Bollman as directed. Signed, Tereso Newcomer, PA-C   05/21/2013 9:52 AM

## 2013-06-09 ENCOUNTER — Other Ambulatory Visit: Payer: Self-pay | Admitting: Cardiovascular Disease

## 2013-06-24 ENCOUNTER — Other Ambulatory Visit: Payer: Self-pay | Admitting: Cardiovascular Disease

## 2013-10-04 ENCOUNTER — Other Ambulatory Visit: Payer: Self-pay

## 2013-10-29 ENCOUNTER — Other Ambulatory Visit: Payer: Self-pay | Admitting: Cardiovascular Disease

## 2013-10-29 ENCOUNTER — Other Ambulatory Visit: Payer: Self-pay | Admitting: Internal Medicine

## 2013-11-05 ENCOUNTER — Encounter: Payer: Self-pay | Admitting: Internal Medicine

## 2013-11-05 ENCOUNTER — Encounter: Payer: BC Managed Care – PPO | Admitting: Internal Medicine

## 2013-11-05 ENCOUNTER — Ambulatory Visit (INDEPENDENT_AMBULATORY_CARE_PROVIDER_SITE_OTHER): Payer: BC Managed Care – PPO | Admitting: Internal Medicine

## 2013-11-05 ENCOUNTER — Ambulatory Visit (INDEPENDENT_AMBULATORY_CARE_PROVIDER_SITE_OTHER)
Admission: RE | Admit: 2013-11-05 | Discharge: 2013-11-05 | Disposition: A | Discharge status: Home / Self Care | Payer: BC Managed Care – PPO | Source: Ambulatory Visit | Attending: Internal Medicine | Admitting: Internal Medicine

## 2013-11-05 VITALS — BP 130/78 | HR 68 | Temp 98.7°F | Ht 70.0 in | Wt 202.0 lb

## 2013-11-05 DIAGNOSIS — Z23 Encounter for immunization: Secondary | ICD-10-CM

## 2013-11-05 DIAGNOSIS — M1710 Unilateral primary osteoarthritis, unspecified knee: Secondary | ICD-10-CM

## 2013-11-05 DIAGNOSIS — F329 Major depressive disorder, single episode, unspecified: Secondary | ICD-10-CM

## 2013-11-05 DIAGNOSIS — IMO0001 Reserved for inherently not codable concepts without codable children: Secondary | ICD-10-CM

## 2013-11-05 DIAGNOSIS — I10 Essential (primary) hypertension: Secondary | ICD-10-CM

## 2013-11-05 DIAGNOSIS — Z0000 Encounter for general adult medical examination without abnormal findings: Secondary | ICD-10-CM

## 2013-11-05 DIAGNOSIS — M1711 Unilateral primary osteoarthritis, right knee: Secondary | ICD-10-CM

## 2013-11-05 DIAGNOSIS — IMO0002 Reserved for concepts with insufficient information to code with codable children: Secondary | ICD-10-CM

## 2013-11-05 DIAGNOSIS — E785 Hyperlipidemia, unspecified: Secondary | ICD-10-CM

## 2013-11-05 DIAGNOSIS — Z125 Encounter for screening for malignant neoplasm of prostate: Secondary | ICD-10-CM

## 2013-11-05 LAB — CBC WITH DIFFERENTIAL/PLATELET
Basophils Relative: 0.4 % (ref 0.0–3.0)
Eosinophils Absolute: 0.5 10*3/uL (ref 0.0–0.7)
HCT: 40.2 % (ref 39.0–52.0)
Hemoglobin: 13.4 g/dL (ref 13.0–17.0)
MCHC: 33.4 g/dL (ref 30.0–36.0)
MCV: 97.3 fl (ref 78.0–100.0)
Monocytes Absolute: 0.7 10*3/uL (ref 0.1–1.0)
Neutro Abs: 3.3 10*3/uL (ref 1.4–7.7)
Neutrophils Relative %: 53.5 % (ref 43.0–77.0)
RBC: 4.13 Mil/uL — ABNORMAL LOW (ref 4.22–5.81)
RDW: 14.8 % — ABNORMAL HIGH (ref 11.5–14.6)

## 2013-11-05 LAB — BASIC METABOLIC PANEL
CO2: 24 mEq/L (ref 19–32)
Chloride: 104 mEq/L (ref 96–112)
Creatinine, Ser: 1.1 mg/dL (ref 0.4–1.5)
Glucose, Bld: 122 mg/dL — ABNORMAL HIGH (ref 70–99)
Potassium: 4.7 mEq/L (ref 3.5–5.1)

## 2013-11-05 LAB — PSA: PSA: 1.29 ng/mL (ref 0.10–4.00)

## 2013-11-05 LAB — LIPID PANEL: Total CHOL/HDL Ratio: 4

## 2013-11-05 LAB — HEPATIC FUNCTION PANEL
ALT: 48 U/L (ref 0–53)
Total Protein: 7.5 g/dL (ref 6.0–8.3)

## 2013-11-05 LAB — HM DIABETES FOOT EXAM

## 2013-11-05 MED ORDER — ZOSTER VACCINE LIVE 19400 UNT/0.65ML ~~LOC~~ SOLR: 0.6500 mL | Freq: Once | SUBCUTANEOUS | Status: DC

## 2013-11-05 NOTE — Addendum Note (Signed)
Addended by: Sydell Axon C on: 11/05/2013 01:01 PM   Modules accepted: Orders

## 2013-11-05 NOTE — Assessment & Plan Note (Signed)
BP Readings from Last 3 Encounters:  11/05/13 130/78  04/24/13 138/81  04/16/13 120/80   Good control still

## 2013-11-05 NOTE — Progress Notes (Signed)
Subjective:    Patient ID: David [name], male    DOB: 09/11/1951, 62 y.o.   MRN: [id number]  HPI Here for physical Doing well except for "aches and pain"  Checking sugars 1-2 times per week Generally 160-180 fasting No hypoglycemic reactions Due for eye exam  Cardiology follow up since my last visit Had stress test and it was okay  Ongoing right knee pain Thinks he needs cortisone shot there Right 2nd toe pain--no redness Known bone spurs in feet--- cortisone shots by Dr Charlsie Merles  Current Outpatient Prescriptions on File Prior to Visit  Medication Sig Dispense Refill  . aspirin 81 MG tablet Take 81 mg by mouth daily.        [name] atorvastatin (LIPITOR) 40 MG tablet TAKE ONE TABLET BY MOUTH ONCE DAILY  30 tablet  5  . clopidogrel (PLAVIX) 75 MG tablet Take 1 tablet (75 mg total) by mouth daily.  90 tablet  3  . glipiZIDE (GLUCOTROL) 5 MG tablet Take 1 tablet (5 mg total) by mouth daily.  90 tablet  3  . glucose blood (ONE TOUCH TEST STRIPS) test strip 1 each by Other route daily. Dx: 250.02  100 each  1  . losartan (COZAAR) 100 MG tablet TAKE ONE TABLET BY MOUTH ONCE DAILY  30 tablet  6  . metFORMIN (GLUCOPHAGE) 1000 MG tablet TAKE ONE TABLET BY MOUTH TWICE DAILY WITH FOOD  60 tablet  11  . metoprolol succinate (TOPROL-XL) 25 MG 24 hr tablet TAKE ONE TABLET BY MOUTH ONCE DAILY  30 tablet  5  . nitroGLYCERIN (NITROSTAT) 0.4 MG SL tablet Place 1 tablet (0.4 mg total) under the tongue every 5 (five) minutes as needed.  25 tablet  4  . ONE TOUCH LANCETS MISC by Does not apply route as directed.        [name] PARoxetine (PAXIL) 20 MG tablet TAKE TWO TABLETS BY MOUTH EVERY DAY FOR DEPRESSION  60 tablet  0  . sildenafil (VIAGRA) 100 MG tablet Take 50-100 mg by mouth as needed.      . triamcinolone cream (KENALOG) 0.1 % Apply topically 2 (two) times daily as needed. For psoriasis  45 g  2   No current facility-administered medications on file prior to visit.    No Known Allergies  Past Medical  History  Diagnosis Date  . Allergy   . Depression   . Diabetes mellitus   . Arthritis   . Sleep apnea     CPAP  . CAD (coronary artery disease)     s/p bifurcation stenting (DES x2) and stenting bifurcation lesion distal RCA (1 DES)  . Hyperlipemia   . Hypertension     Past Surgical History  Procedure Laterality Date  . Tonsillectomy  1959  . Coronary angioplasty with stent placement   March 2010    Dr. Juanda Chance    Family History  Problem Relation Age of Onset  . Heart failure Father     Deceased 41 y/o  . Diabetes Father   . Cancer Father     Prostate  . Heart failure Mother     Deceased 56 y/o  . Schizophrenia Son     Paranoid  . Uveitis Son   . Cancer Brother     prostate  . Colon cancer Neg Hx   . Esophageal cancer Neg Hx   . Stomach cancer Neg Hx     History   Social History  . Marital Status: Married    Spouse  Name: N/A    Number of Children: N/A  . Years of Education: N/A   Occupational History  . Designer, multimedia buildings    Social History Main Topics  . Smoking status: Former Smoker    Types: Cigarettes    Quit date: 11/30/1983  . Smokeless tobacco: Never Used  . Alcohol Use: 2.5 oz/week    5 drink(s) per week  . Drug Use: No  . Sexual Activity: Not on file   Other Topics Concern  . Not on file   Social History Narrative  . No narrative on file   Review of Systems  Constitutional: Negative for fatigue and unexpected weight change.       Wears seat belt  HENT: Positive for rhinorrhea. Negative for dental problem, hearing loss and tinnitus.        Regular with dentist  Eyes: Negative for visual disturbance.       No diplopia or unilateral vision loss  Respiratory: Negative for cough, chest tightness and shortness of breath.   Cardiovascular: Positive for palpitations. Negative for chest pain and leg swelling.       Occ feels rapid heart when lying in bed at night  Gastrointestinal: Negative for nausea, vomiting, abdominal pain,  constipation and blood in stool.       No heartburn  Endocrine: Negative for cold intolerance and heat intolerance.  Genitourinary: Positive for difficulty urinating.       Some slow stream and dribbling Nocturia x 2 Uses viagra occasionally ---satisfied with this   Musculoskeletal: Positive for arthralgias and back pain.       Sciatica is worse  Skin: Positive for rash.       Still with psoriasis--triamcinolone helps but doesn't resolve  Allergic/Immunologic: Positive for environmental allergies. Negative for immunocompromised state.  Neurological: Negative for dizziness, syncope, weakness, light-headedness, numbness and headaches.  Hematological: Negative for adenopathy. Bruises/bleeds easily.  Psychiatric/Behavioral: Positive for dysphoric mood. Negative for sleep disturbance. The patient is not nervous/anxious.        Wife still concerned about his mood-he feels his main issues are reactive----unemployed, worries about money       Objective:   Physical Exam  Constitutional: He is oriented to person, place, and time. He appears well-developed and well-nourished. No distress.  HENT:  Head: Normocephalic and atraumatic.  Right Ear: External ear normal.  Left Ear: External ear normal.  Mouth/Throat: Oropharynx is clear and moist. No oropharyngeal exudate.  Eyes: Conjunctivae are normal. Pupils are equal, round, and reactive to light.  Neck: Normal range of motion. Neck supple. No thyromegaly present.  Cardiovascular: Normal rate, regular rhythm, normal heart sounds and intact distal pulses.  Exam reveals no gallop.   No murmur heard. Pulmonary/Chest: Effort normal and breath sounds normal. No respiratory distress. He has no wheezes. He has no rales.  Abdominal: Soft. There is no tenderness.  Musculoskeletal: He exhibits no edema and no tenderness.  Right knee-- no ligament or meniscus findings Normal passive ROM Mild crepitus  Hip ROM is fairly normal  Lymphadenopathy:    He  has no cervical adenopathy.  Neurological: He is alert and oriented to person, place, and time.  Normal sensation in plantar feet  Skin: Rash noted.  Scattered small psoriatic plaques No foot lesions  Psychiatric: He has a normal mood and affect. His behavior is normal.          Assessment & Plan:

## 2013-11-05 NOTE — Assessment & Plan Note (Signed)
UTD on colon Due for PSA--he requests Tdap and flu today

## 2013-11-05 NOTE — Assessment & Plan Note (Signed)
Taking the med regularly Will recheck labs

## 2013-11-05 NOTE — Assessment & Plan Note (Signed)
Hopefully some better Can increase glipizide to bid if needed Then next step is for newer med (endocrine?)

## 2013-11-05 NOTE — Progress Notes (Signed)
Pre-visit discussion using our clinic review tool. No additional management support is needed unless otherwise documented below in the visit note.  

## 2013-11-05 NOTE — Assessment & Plan Note (Signed)
Will check x-ray Consider cortisone shot

## 2013-11-05 NOTE — Assessment & Plan Note (Signed)
Ongoing stress---lost job, etc Not sure more med is the answer

## 2013-11-06 LAB — LDL CHOLESTEROL, DIRECT: Direct LDL: 103.9 mg/dL

## 2013-11-07 ENCOUNTER — Ambulatory Visit (INDEPENDENT_AMBULATORY_CARE_PROVIDER_SITE_OTHER): Payer: BC Managed Care – PPO | Admitting: Internal Medicine

## 2013-11-07 ENCOUNTER — Encounter: Payer: Self-pay | Admitting: Internal Medicine

## 2013-11-07 VITALS — BP 138/90 | HR 61 | Temp 99.3°F | Wt 200.8 lb

## 2013-11-07 DIAGNOSIS — IMO0002 Reserved for concepts with insufficient information to code with codable children: Secondary | ICD-10-CM

## 2013-11-07 DIAGNOSIS — M1710 Unilateral primary osteoarthritis, unspecified knee: Secondary | ICD-10-CM

## 2013-11-07 DIAGNOSIS — M1711 Unilateral primary osteoarthritis, right knee: Secondary | ICD-10-CM

## 2013-11-07 NOTE — Progress Notes (Signed)
Pre-visit discussion using our clinic review tool. No additional management support is needed unless otherwise documented below in the visit note.  

## 2013-11-07 NOTE — Progress Notes (Signed)
   Subjective:    Patient ID: David [name], male    DOB: [birth year], 62 y.o.   MRN: [id number]  HPI Here for right knee injection   Review of Systems     Objective:   Physical Exam        Assessment & Plan:

## 2013-11-07 NOTE — Assessment & Plan Note (Signed)
PROCEDURE  Sterile prep Lateral approach to right knee Ethyl chloride and 2cc plain lido 40mg  depomedrol and 6cc lidocaine into knee Tolerated well with some improvement Discussed home care

## 2013-11-09 ENCOUNTER — Other Ambulatory Visit: Payer: Self-pay | Admitting: Internal Medicine

## 2013-11-29 ENCOUNTER — Other Ambulatory Visit: Payer: Self-pay | Admitting: Internal Medicine

## 2013-12-03 LAB — HM DIABETES EYE EXAM

## 2013-12-07 ENCOUNTER — Other Ambulatory Visit: Payer: Self-pay | Admitting: Internal Medicine

## 2013-12-17 ENCOUNTER — Encounter: Payer: Self-pay | Admitting: Internal Medicine

## 2013-12-28 ENCOUNTER — Other Ambulatory Visit: Payer: Self-pay | Admitting: Internal Medicine

## 2014-01-31 ENCOUNTER — Other Ambulatory Visit: Payer: Self-pay | Admitting: Internal Medicine

## 2014-02-14 ENCOUNTER — Other Ambulatory Visit: Payer: Self-pay | Admitting: Internal Medicine

## 2014-03-07 ENCOUNTER — Other Ambulatory Visit: Payer: Self-pay | Admitting: Internal Medicine

## 2014-03-07 ENCOUNTER — Other Ambulatory Visit: Payer: Self-pay | Admitting: Cardiovascular Disease

## 2014-03-11 ENCOUNTER — Other Ambulatory Visit: Payer: Self-pay | Admitting: Internal Medicine

## 2014-03-15 ENCOUNTER — Ambulatory Visit: Payer: Self-pay | Admitting: Podiatry

## 2014-04-27 ENCOUNTER — Other Ambulatory Visit: Payer: Self-pay | Admitting: Internal Medicine

## 2014-05-07 ENCOUNTER — Ambulatory Visit (INDEPENDENT_AMBULATORY_CARE_PROVIDER_SITE_OTHER): Payer: Medicare Other | Admitting: Internal Medicine

## 2014-05-07 ENCOUNTER — Encounter: Payer: Self-pay | Admitting: Internal Medicine

## 2014-05-07 VITALS — BP 122/70 | HR 87 | Temp 98.4°F | Wt 198.0 lb

## 2014-05-07 DIAGNOSIS — F3289 Other specified depressive episodes: Secondary | ICD-10-CM

## 2014-05-07 DIAGNOSIS — E785 Hyperlipidemia, unspecified: Secondary | ICD-10-CM

## 2014-05-07 DIAGNOSIS — F329 Major depressive disorder, single episode, unspecified: Secondary | ICD-10-CM

## 2014-05-07 DIAGNOSIS — IMO0001 Reserved for inherently not codable concepts without codable children: Secondary | ICD-10-CM

## 2014-05-07 DIAGNOSIS — I2510 Atherosclerotic heart disease of native coronary artery without angina pectoris: Secondary | ICD-10-CM

## 2014-05-07 DIAGNOSIS — E1165 Type 2 diabetes mellitus with hyperglycemia: Principal | ICD-10-CM

## 2014-05-07 DIAGNOSIS — I10 Essential (primary) hypertension: Secondary | ICD-10-CM

## 2014-05-07 LAB — HEMOGLOBIN A1C: Hgb A1c MFr Bld: 9.6 % — ABNORMAL HIGH (ref 4.6–6.5)

## 2014-05-07 MED ORDER — VENLAFAXINE HCL ER 75 MG PO CP24: 75.0000 mg | ORAL_CAPSULE | Freq: Every day | ORAL | Status: DC

## 2014-05-07 NOTE — Assessment & Plan Note (Signed)
BP Readings from Last 3 Encounters:  05/07/14 122/70  11/07/13 138/90  11/05/13 130/78   Good control

## 2014-05-07 NOTE — Progress Notes (Signed)
Pre visit review using our clinic review tool, if applicable. No additional management support is needed unless otherwise documented below in the visit note. 

## 2014-05-07 NOTE — Assessment & Plan Note (Signed)
Lab Results  Component Value Date   HGBA1C 8.0* 11/05/2013   Borderline control Hasn't been careful but weight down slightly Will increase glipizide to bid if worse

## 2014-05-07 NOTE — Assessment & Plan Note (Signed)
Has been quiet On appropriate meds 

## 2014-05-07 NOTE — Assessment & Plan Note (Signed)
No problems with the med

## 2014-05-07 NOTE — Assessment & Plan Note (Signed)
Lots of life stress Depression is worse---anxiety also Will add venlafaxine for adjunctive Rx Increase prn
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
## 2014-05-08 ENCOUNTER — Other Ambulatory Visit: Payer: Self-pay | Admitting: Internal Medicine

## 2014-05-08 ENCOUNTER — Telehealth: Payer: Self-pay | Admitting: Internal Medicine

## 2014-05-08 NOTE — Telephone Encounter (Signed)
Relevant patient education assigned to patient using Emmi. ° °

## 2014-05-14 ENCOUNTER — Telehealth: Payer: Self-pay | Admitting: Cardiovascular Disease

## 2014-05-14 NOTE — Telephone Encounter (Signed)
Left message on machine for pt to contact the office.  The pt can be placed on 06/27/14 schedule.

## 2014-05-14 NOTE — Telephone Encounter (Signed)
New Message:  PT is requesting a call back from [place]. Pt wants to be worked in to see Dr. Burt Knack. He canceled his appt for this Thursday due to his wife having cancer and her having a procedure at that time...Marland KitchenMarland Kitchen

## 2014-05-15 NOTE — Telephone Encounter (Signed)
I spoke with the pt and scheduled appointment on 06/27/14.

## 2014-05-15 NOTE — Telephone Encounter (Signed)
Follow up ° ° ° ° ° °Returning David [name]'s call °

## 2014-05-16 ENCOUNTER — Ambulatory Visit: Payer: BC Managed Care – PPO | Admitting: Cardiovascular Disease

## 2014-05-24 ENCOUNTER — Other Ambulatory Visit: Payer: Self-pay | Admitting: Cardiovascular Disease

## 2014-05-29 ENCOUNTER — Other Ambulatory Visit: Payer: Self-pay | Admitting: Internal Medicine

## 2014-06-27 ENCOUNTER — Ambulatory Visit (INDEPENDENT_AMBULATORY_CARE_PROVIDER_SITE_OTHER): Payer: Medicare Other | Admitting: Cardiovascular Disease

## 2014-06-27 ENCOUNTER — Encounter: Payer: Self-pay | Admitting: Cardiovascular Disease

## 2014-06-27 VITALS — BP 122/84 | HR 63 | Ht 70.0 in | Wt 197.8 lb

## 2014-06-27 DIAGNOSIS — I10 Essential (primary) hypertension: Secondary | ICD-10-CM

## 2014-06-27 NOTE — Patient Instructions (Signed)
Your physician recommends that you continue on your current medications as directed. Please refer to the Current Medication list given to you today.  Your physician wants you to follow-up in: 1 year with Dr. Cooper.  You will receive a reminder letter in the mail two months in advance. If you don't receive a letter, please call our office to schedule the follow-up appointment.   

## 2014-06-27 NOTE — Progress Notes (Signed)
HPI:   63 year old gentleman presenting for followup evaluation. He underwent complex PCI in 2010 involving the left circumflex/obtuse marginal bifurcation as well as the distal right coronary artery bifurcation. He had been maintained on long-term aspirin and effient, but was switched from Effient to clopidogrel last year to lower long-term bleeding risk.  The patient denies any cardiopulmonary symptoms. He specifically denies chest pain, chest pressure, shortness of breath, edema, or heart palpitations. He has not engaged in any routine exercise.  Outpatient Encounter Prescriptions as of 06/27/2014  Medication Sig  . aspirin 81 MG tablet Take 81 mg by mouth daily.    [name] atorvastatin (LIPITOR) 40 MG tablet TAKE ONE TABLET BY MOUTH ONCE DAILY  . clopidogrel (PLAVIX) 75 MG tablet TAKE ONE TABLET BY MOUTH ONCE DAILY  . glipiZIDE (GLUCOTROL) 5 MG tablet TAKE ONE TABLET BY MOUTH ONCE DAILY  . glucose blood (ONE TOUCH TEST STRIPS) test strip 1 each by Other route daily. Dx: 250.02  . losartan (COZAAR) 100 MG tablet TAKE ONE TABLET BY MOUTH ONCE DAILY  . metFORMIN (GLUCOPHAGE) 1000 MG tablet TAKE ONE TABLET BY MOUTH TWICE DAILY WITH FOOD  . metoprolol succinate (TOPROL-XL) 25 MG 24 hr tablet TAKE ONE TABLET BY MOUTH ONCE DAILY  . nitroGLYCERIN (NITROSTAT) 0.4 MG SL tablet Place 1 tablet (0.4 mg total) under the tongue every 5 (five) minutes as needed.  . ONE TOUCH LANCETS MISC by Does not apply route as directed.    [name] PARoxetine (PAXIL) 20 MG tablet TAKE TWO TABLETS BY MOUTH ONCE DAILY FOR  DEPRESSION.  . sildenafil (VIAGRA) 100 MG tablet Take 50-100 mg by mouth as needed.  . triamcinolone cream (KENALOG) 0.1 % APPLY TO THE AFFECTED AREA TWICE DAILY AS NEEDED FOR PSORIASIS.  . [DISCONTINUED] venlafaxine XR (EFFEXOR-XR) 75 MG 24 hr capsule Take 1 capsule (75 mg total) by mouth daily with breakfast.  . [DISCONTINUED] atorvastatin (LIPITOR) 40 MG tablet TAKE ONE TABLET BY MOUTH ONCE DAILY  .  [DISCONTINUED] clopidogrel (PLAVIX) 75 MG tablet Take 1 tablet (75 mg total) by mouth daily.  . [DISCONTINUED] metoprolol succinate (TOPROL-XL) 25 MG 24 hr tablet TAKE ONE TABLET BY MOUTH ONCE DAILY  . [DISCONTINUED] PARoxetine (PAXIL) 20 MG tablet TAKE TWO TABLETS BY MOUTH ONCE DAILY FOR DEPRESSION    No Known Allergies  Past Medical History  Diagnosis Date  . Allergy   . Depression   . Diabetes mellitus   . Arthritis   . Sleep apnea     CPAP  . CAD (coronary artery disease)     s/p bifurcation stenting (DES x2) and stenting bifurcation lesion distal RCA (1 DES)  . Hyperlipemia   . Hypertension     ROS: Negative except as per HPI  BP 122/84  Pulse 63  Ht 5\' 10"  (1.778 m)  Wt 197 lb 12.8 oz (89.721 kg)  BMI 28.38 kg/m2  PHYSICAL EXAM: Pt is alert and oriented, NAD HEENT: normal Neck: JVP - normal, carotids 2+= without bruits Lungs: CTA bilaterally CV: RRR without murmur or gallop Abd: soft, NT, Positive BS, no hepatomegaly Ext: no C/C/E, distal pulses intact and equal Skin: warm/dry no rash  EKG:  Normal sinus rhythm 63 beats per minute, within normal limits.  ASSESSMENT AND PLAN: 1. Coronary artery disease, native vessel. The patient will continue on long-term dual antiplatelet therapy and an absence of bleeding problems considering his history of complex bifurcation of stenting. He experiences no symptoms of angina and has no cardiopulmonary limitations. I  did encourage him to increase his exercise program as he is doing no regular exercise at present. We also discussed dietary changes. I will see him back in one year.  2. Hyperlipidemia. Last lipids from 11/05/2013 reviewed as below. LFTs have been minimally elevated. We reviewed the importance of diet and exercise. He may initiate an exercise program, but does not think he can improve upon his diet. Lipid Panel     Component Value Date/Time   CHOL 167 11/05/2013 1249   TRIG 210.0* 11/05/2013 1249   HDL 38.30*  11/05/2013 1249   CHOLHDL 4 11/05/2013 1249   VLDL 42.0* 11/05/2013 1249   LDLCALC 64 04/16/2013 1350   3. Essential hypertension. Blood pressure controlled on current medication.  Overall the patient is stable from a cardiac perspective. He would benefit from lifestyle modification. I will see him back in one year unless problems arise.  Sherren Mocha 06/27/2014 10:19 AM

## 2014-07-03 ENCOUNTER — Other Ambulatory Visit: Payer: Self-pay | Admitting: Internal Medicine

## 2014-07-03 ENCOUNTER — Other Ambulatory Visit: Payer: Self-pay | Admitting: Cardiovascular Disease

## 2014-07-09 ENCOUNTER — Encounter: Payer: Self-pay | Admitting: Internal Medicine

## 2014-07-09 ENCOUNTER — Ambulatory Visit (INDEPENDENT_AMBULATORY_CARE_PROVIDER_SITE_OTHER): Payer: Medicare Other | Admitting: Internal Medicine

## 2014-07-09 VITALS — BP 118/80 | HR 70 | Temp 97.8°F | Wt 197.0 lb

## 2014-07-09 DIAGNOSIS — E1165 Type 2 diabetes mellitus with hyperglycemia: Secondary | ICD-10-CM

## 2014-07-09 DIAGNOSIS — IMO0001 Reserved for inherently not codable concepts without codable children: Secondary | ICD-10-CM

## 2014-07-09 DIAGNOSIS — F329 Major depressive disorder, single episode, unspecified: Secondary | ICD-10-CM

## 2014-07-09 DIAGNOSIS — M159 Polyosteoarthritis, unspecified: Secondary | ICD-10-CM

## 2014-07-09 DIAGNOSIS — F3289 Other specified depressive episodes: Secondary | ICD-10-CM

## 2014-07-09 DIAGNOSIS — M150 Primary generalized (osteo)arthritis: Secondary | ICD-10-CM

## 2014-07-09 MED ORDER — SILDENAFIL CITRATE 20 MG PO TABS: 60.0000 mg | ORAL_TABLET | Freq: Every day | ORAL | Status: DC | PRN

## 2014-07-09 MED ORDER — GLIPIZIDE 10 MG PO TABS: 10.0000 mg | ORAL_TABLET | Freq: Two times a day (BID) | ORAL | Status: DC

## 2014-07-09 NOTE — Assessment & Plan Note (Signed)
Discussed capsaicin and OTC analgesics Needs to work on core and leg strength

## 2014-07-09 NOTE — Assessment & Plan Note (Signed)
Non compliant with lifestyle much of the time Not really interested in endo Will just increase the glipizide for now

## 2014-07-09 NOTE — Progress Notes (Signed)
Subjective:    Patient ID: David [name], male    DOB: [birth year], 63 y.o.   MRN: [id number]  HPI Unable to tolerate the venlafaxine-- reduced his urine stream intolerably Did take it for a full month but didn't notice any difference Still lots of stress Wife moving along with cancer rx--positive results May be some better--doesn't want to try new meds Some degree of anhedonia---"family in chaos, financially a disaster..due to some of things I have done" Likes TV and computer Does think about dying--due "to my age". No suicidal thoughts that are serious  Still working part time He likes it but on his feet all day Having more arthritis pain-- CMC mostly, and knees. Some in hips also Takes ibuprofen 200 daily or bid. Some help Has tried combination capsaicin patches--not helpful.  Still having high sugars-- over 200 fasting Did double the glipizide but no major improvement Admits to some dietary non compliance--good spells and bad spells He states "I just don't care"  Current Outpatient Prescriptions on File Prior to Visit  Medication Sig Dispense Refill  . aspirin 81 MG tablet Take 81 mg by mouth daily.        [name] atorvastatin (LIPITOR) 40 MG tablet TAKE ONE TABLET BY MOUTH ONCE DAILY  30 tablet  5  . clopidogrel (PLAVIX) 75 MG tablet TAKE ONE TABLET BY MOUTH ONCE DAILY  90 tablet  0  . glucose blood (ONE TOUCH TEST STRIPS) test strip 1 each by Other route daily. Dx: 250.02  100 each  1  . losartan (COZAAR) 100 MG tablet TAKE ONE TABLET BY MOUTH ONCE DAILY  30 tablet  5  . metFORMIN (GLUCOPHAGE) 1000 MG tablet TAKE ONE TABLET BY MOUTH TWICE DAILY WITH FOOD  60 tablet  11  . metoprolol succinate (TOPROL-XL) 25 MG 24 hr tablet TAKE ONE TABLET BY MOUTH ONCE DAILY  30 tablet  5  . nitroGLYCERIN (NITROSTAT) 0.4 MG SL tablet Place 1 tablet (0.4 mg total) under the tongue every 5 (five) minutes as needed.  25 tablet  4  . ONE TOUCH LANCETS MISC by Does not apply route as directed.        [name]  PARoxetine (PAXIL) 20 MG tablet TAKE TWO TABLETS BY MOUTH ONCE DAILY FOR DEPRESSION  60 tablet  0  . sildenafil (VIAGRA) 100 MG tablet Take 50-100 mg by mouth as needed.      . triamcinolone cream (KENALOG) 0.1 % APPLY TO THE AFFECTED AREA TWICE DAILY AS NEEDED FOR PSORIASIS.  45 g  0   No current facility-administered medications on file prior to visit.    Allergies  Allergen Reactions  . Venlafaxine     Slowed his urine stream    Past Medical History  Diagnosis Date  . Allergy   . Depression   . Diabetes mellitus   . Arthritis   . Sleep apnea     CPAP  . CAD (coronary artery disease)     s/p bifurcation stenting (DES x2) and stenting bifurcation lesion distal RCA (1 DES)  . Hyperlipemia   . Hypertension     Past Surgical History  Procedure Laterality Date  . Tonsillectomy  1959  . Coronary angioplasty with stent placement   March 2010    Dr. Olevia Perches    Family History  Problem Relation Age of Onset  . Heart failure Father     Deceased 43 y/o  . Diabetes Father   . Cancer Father     Prostate  .  Heart failure Mother     Deceased 41 y/o  . Schizophrenia Son     Paranoid  . Uveitis Son   . Cancer Brother     prostate  . Colon cancer Neg Hx   . Esophageal cancer Neg Hx   . Stomach cancer Neg Hx     History   Social History  . Marital Status: Married    Spouse Name: N/A    Number of Children: N/A  . Years of Education: N/A   Occupational History  . 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 Smoking status: Former Smoker    Types: Cigarettes    Quit date: 11/30/1983  . Smokeless tobacco: Never Used  . Alcohol Use: 2.5 oz/week    5 drink(s) per week  . Drug Use: No  . Sexual Activity: Not on file   Other Topics Concern  . Not on file   Social History Narrative  . No narrative on file   Review of Systems Sleeps okay Weight is stable    Objective:   Physical Exam  Constitutional: He  appears well-developed and well-nourished. No distress.  Neck: Normal range of motion. Neck supple. No thyromegaly present.  Cardiovascular: Normal rate, regular rhythm and normal heart sounds.  Exam reveals no gallop.   No murmur heard. Pulmonary/Chest: Effort normal and breath sounds normal. No respiratory distress. He has no wheezes. He has no rales.  Musculoskeletal: He exhibits no edema and no tenderness.  Lymphadenopathy:    He has no cervical adenopathy.  Psychiatric:  Mild degree of hopelessness No sig suicidal ideation Mild depressed mood with appropriate affect Some guilt over his financial mismanagement           Assessment & Plan:

## 2014-07-09 NOTE — Progress Notes (Signed)
Pre visit review using our clinic review tool, if applicable. No additional management support is needed unless otherwise documented below in the visit note. 

## 2014-07-09 NOTE — Patient Instructions (Signed)
Please try capsiacin cream on your thumbs. Try arthritis acetaminophen 650mg  two or three times a day. If that doesn't help your arthritis, you can go back to the ibuprofen--trying 2 tabs twice or three times a day. You need to work on leg and core muscle strength.

## 2014-07-09 NOTE — Assessment & Plan Note (Signed)
Didn't tolerate the addition of venlafaxine Mostly seems situational--may not respond to more meds Some hopelessness but no SI Continue just the paroxetine

## 2014-08-20 ENCOUNTER — Telehealth: Payer: Self-pay | Admitting: Cardiovascular Disease

## 2014-08-20 NOTE — Telephone Encounter (Signed)
New problem:     Pt called to speak to someone about his medication he is at the [facility] office.  Pt needs to have a tooth extracted

## 2014-08-20 NOTE — Telephone Encounter (Addendum)
David [name] is at dentist office needing a tooth extracted and his dentist wants to know if should stop his Plavix prior to extraction.  Spoke w/ pharmacist Ysidro Evert Smart) who states unless it is a deep extraction he does not need to stop Plavix.  If he has some bleeding afterwards may put tea bag in warm water and put against tooth. He verbalizes understanding and relay to dentist.

## 2014-08-20 NOTE — Telephone Encounter (Signed)
Follow up:   Pt would like a call back for Lauren when she comes in about Plavix and his tooth extraction.   Please give him a call back.

## 2014-08-21 ENCOUNTER — Other Ambulatory Visit: Payer: Self-pay | Admitting: Internal Medicine

## 2014-09-12 ENCOUNTER — Other Ambulatory Visit: Payer: Self-pay | Admitting: Internal Medicine

## 2014-09-13 NOTE — Telephone Encounter (Signed)
Electronic Rx request for paxil received. Patient's last office visit was 07/09/14 and medication last filled 08/22/14. Is it okay to refill at this time?

## 2014-09-13 NOTE — Telephone Encounter (Signed)
Rx sent to pharmacy as ordered.

## 2014-09-13 NOTE — Telephone Encounter (Signed)
Okay to refill for a year 

## 2014-10-09 ENCOUNTER — Encounter: Payer: Self-pay | Admitting: Internal Medicine

## 2014-10-09 ENCOUNTER — Ambulatory Visit (INDEPENDENT_AMBULATORY_CARE_PROVIDER_SITE_OTHER): Payer: Medicare Other | Admitting: Internal Medicine

## 2014-10-09 VITALS — BP 120/70 | HR 72 | Temp 98.3°F | Ht 70.0 in | Wt 194.0 lb

## 2014-10-09 DIAGNOSIS — F331 Major depressive disorder, recurrent, moderate: Secondary | ICD-10-CM

## 2014-10-09 DIAGNOSIS — M150 Primary generalized (osteo)arthritis: Secondary | ICD-10-CM

## 2014-10-09 DIAGNOSIS — M159 Polyosteoarthritis, unspecified: Secondary | ICD-10-CM

## 2014-10-09 DIAGNOSIS — E1165 Type 2 diabetes mellitus with hyperglycemia: Secondary | ICD-10-CM

## 2014-10-09 DIAGNOSIS — IMO0001 Reserved for inherently not codable concepts without codable children: Secondary | ICD-10-CM

## 2014-10-09 DIAGNOSIS — Z23 Encounter for immunization: Secondary | ICD-10-CM

## 2014-10-09 LAB — HEMOGLOBIN A1C: Hgb A1c MFr Bld: 11.2 % — ABNORMAL HIGH (ref 4.6–6.5)

## 2014-10-09 NOTE — Progress Notes (Signed)
Subjective:    Patient ID: David [name], male    DOB: 01-25-1951, 63 y.o.   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 No suicidal thoughts  Still noncompliant with diet Last check 181  Current Outpatient Prescriptions on File Prior to Visit  Medication Sig Dispense Refill  . aspirin 81 MG tablet Take 81 mg by mouth daily.      [name] atorvastatin (LIPITOR) 40 MG tablet TAKE ONE TABLET BY MOUTH ONCE DAILY 30 tablet 5  . clopidogrel (PLAVIX) 75 MG tablet TAKE ONE TABLET BY MOUTH ONCE DAILY 90 tablet 0  . glipiZIDE (GLUCOTROL) 10 MG tablet Take 1 tablet (10 mg total) by mouth 2 (two) times daily before a meal. 60 tablet 11  . glucose blood (ONE TOUCH TEST STRIPS) test strip 1 each by Other route daily. Dx: 250.02 100 each 1  . losartan (COZAAR) 100 MG tablet TAKE ONE TABLET BY MOUTH ONCE DAILY 30 tablet 5  . metFORMIN (GLUCOPHAGE) 1000 MG tablet TAKE ONE TABLET BY MOUTH TWICE DAILY WITH FOOD 60 tablet 11  . metoprolol succinate (TOPROL-XL) 25 MG 24 hr tablet TAKE ONE TABLET BY MOUTH ONCE DAILY 30 tablet 5  . ONE TOUCH LANCETS MISC by Does not apply route as directed.      [name] PARoxetine (PAXIL) 20 MG tablet TAKE TWO TABLETS BY MOUTH ONCE DAILY FOR  DEPRESSION 60 tablet 11  . sildenafil (REVATIO) 20 MG tablet Take 3-5 tablets (60-100 mg total) by mouth daily as needed. 50 tablet 11  . triamcinolone cream (KENALOG) 0.1 % APPLY TO THE AFFECTED AREA TWICE  DAILY AS NEEDED FOR PSORIASIS. 45 g 0   No current facility-administered medications on file prior to visit.    Allergies  Allergen Reactions  . Venlafaxine     Slowed his urine stream    Past Medical History  Diagnosis Date  . Allergy   . Depression   . Diabetes mellitus   . Arthritis   . Sleep apnea     CPAP  . CAD (coronary artery disease)     s/p bifurcation stenting (DES x2) and stenting bifurcation lesion distal RCA (1 DES)  . Hyperlipemia   . Hypertension     Past Surgical History  Procedure Laterality Date  . Tonsillectomy  1959  . Coronary angioplasty with stent placement   March 2010    Dr. Olevia Perches    Family History  Problem Relation Age of Onset  . Heart failure Father     Deceased 72 y/o  . Diabetes Father   . Cancer Father     Prostate  . Heart failure Mother     Deceased 22 y/o  . Schizophrenia Son     Paranoid  . Uveitis Son   . Cancer Brother     prostate  . Colon cancer Neg Hx   . Esophageal cancer Neg Hx   . Stomach cancer Neg Hx     History   Social History  .  Marital Status: Married    Spouse Name: N/A    Number of Children: N/A  . Years of Education: N/A   Occupational History  . 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 Smoking status: Former Smoker    Types: Cigarettes    Quit date: 11/30/1983  . Smokeless tobacco: Never Used  . Alcohol Use: 2.5 oz/week    5 drink(s) per week  . Drug Use: No  . Sexual Activity: Not on file   Other Topics Concern  . Not on file   Social History Narrative   Review of Systems  Appetite is good Sleep is not as good-- trouble initiating. If mind starts going, hard to get back to sleep after awakening     Objective:   Physical Exam  Constitutional: He appears well-developed and well-nourished. No distress.  Psychiatric:  Calmer and more composed No overt depression Normal appearance and speech          Assessment  & Plan:

## 2014-10-09 NOTE — Progress Notes (Signed)
Pre visit review using our clinic review tool, if applicable. No additional management support is needed unless otherwise documented below in the visit note. 

## 2014-10-09 NOTE — Assessment & Plan Note (Signed)
No inexpensive alternatives if he is still high Hopefully can improve lifestyle

## 2014-10-09 NOTE — Assessment & Plan Note (Signed)
Capsaicin no help Continues with tylenol and ibuprofen for when things are worse

## 2014-10-09 NOTE — Assessment & Plan Note (Signed)
Seems some better now ---?due to wife's finishing cancer Rx Needs to continue paxil Will refer for psycholgist

## 2014-10-09 NOTE — Addendum Note (Signed)
Addended by: Despina Hidden on: 10/09/2014 09:46 AM   Modules accepted: Orders

## 2014-10-18 ENCOUNTER — Encounter: Payer: Self-pay | Admitting: Internal Medicine

## 2014-10-20 ENCOUNTER — Other Ambulatory Visit: Payer: Self-pay | Admitting: Internal Medicine

## 2014-10-21 MED ORDER — TRIAMCINOLONE ACETONIDE 0.1 % EX CREA: TOPICAL_CREAM | Freq: Two times a day (BID) | CUTANEOUS | Status: DC

## 2014-10-31 ENCOUNTER — Other Ambulatory Visit: Payer: Self-pay | Admitting: *Deleted

## 2014-10-31 MED ORDER — CLOPIDOGREL BISULFATE 75 MG PO TABS: 75.0000 mg | ORAL_TABLET | Freq: Every day | ORAL | Status: DC

## 2014-12-16 ENCOUNTER — Telehealth: Payer: Self-pay

## 2014-12-16 NOTE — Telephone Encounter (Signed)
Faythe Ghee Let him know that sciatic pain will often get better on its own Best to stay active, like walking, but avoid a lot of bending or lifting

## 2014-12-16 NOTE — Telephone Encounter (Signed)
Spoke with patient and he could not get off work tomorrow, he canceled his appt and will call if things change.

## 2014-12-16 NOTE — Telephone Encounter (Signed)
If the tramadol is not helping, the next step would be norco--which requires a written script. Does he want to come in today for a small amount of this pending his appt tomorrow?

## 2014-12-16 NOTE — Telephone Encounter (Signed)
Pt left v/m requesting pain med for sciatic pain in lt hip; pt having pain for 1 week; difficult for pt to walk due to pain. pt has been taking ibuprofen and tramadol (wife's med) without relief. Pt request med sent to [facility]. Pt already scheduled appt with Dr Silvio Pate 12/17/14. Pt request cb.

## 2014-12-17 ENCOUNTER — Ambulatory Visit (INDEPENDENT_AMBULATORY_CARE_PROVIDER_SITE_OTHER): Payer: No Typology Code available for payment source | Admitting: Psychology

## 2014-12-17 ENCOUNTER — Ambulatory Visit: Payer: Self-pay | Admitting: Internal Medicine

## 2014-12-17 DIAGNOSIS — F332 Major depressive disorder, recurrent severe without psychotic features: Secondary | ICD-10-CM | POA: Diagnosis not present

## 2014-12-17 DIAGNOSIS — F1099 Alcohol use, unspecified with unspecified alcohol-induced disorder: Secondary | ICD-10-CM | POA: Diagnosis not present

## 2015-01-01 ENCOUNTER — Other Ambulatory Visit: Payer: Self-pay | Admitting: Internal Medicine

## 2015-01-07 LAB — HM DIABETES EYE EXAM

## 2015-01-16 ENCOUNTER — Encounter: Payer: Self-pay | Admitting: Internal Medicine

## 2015-02-03 ENCOUNTER — Other Ambulatory Visit: Payer: Self-pay | Admitting: Internal Medicine

## 2015-02-04 MED ORDER — TRIAMCINOLONE ACETONIDE 0.1 % EX CREA: TOPICAL_CREAM | Freq: Two times a day (BID) | CUTANEOUS | Status: DC

## 2015-02-06 ENCOUNTER — Other Ambulatory Visit: Payer: Self-pay | Admitting: Internal Medicine

## 2015-02-27 ENCOUNTER — Encounter: Payer: Self-pay | Admitting: Internal Medicine

## 2015-03-04 ENCOUNTER — Other Ambulatory Visit: Payer: Self-pay | Admitting: Cardiovascular Disease

## 2015-03-20 ENCOUNTER — Other Ambulatory Visit: Payer: Self-pay | Admitting: Internal Medicine

## 2015-03-20 ENCOUNTER — Other Ambulatory Visit: Payer: Self-pay | Admitting: Cardiovascular Disease

## 2015-03-26 ENCOUNTER — Other Ambulatory Visit: Payer: Self-pay | Admitting: Internal Medicine

## 2015-05-16 ENCOUNTER — Ambulatory Visit (INDEPENDENT_AMBULATORY_CARE_PROVIDER_SITE_OTHER): Payer: Medicare Other | Admitting: Internal Medicine

## 2015-05-16 ENCOUNTER — Encounter: Payer: Self-pay | Admitting: Internal Medicine

## 2015-05-16 ENCOUNTER — Ambulatory Visit (INDEPENDENT_AMBULATORY_CARE_PROVIDER_SITE_OTHER)
Admission: RE | Admit: 2015-05-16 | Discharge: 2015-05-16 | Disposition: A | Discharge status: Home / Self Care | Payer: Medicare Other | Source: Ambulatory Visit | Attending: Internal Medicine | Admitting: Internal Medicine

## 2015-05-16 VITALS — BP 128/78 | HR 67 | Temp 97.9°F | Ht 70.0 in | Wt 191.0 lb

## 2015-05-16 DIAGNOSIS — IMO0001 Reserved for inherently not codable concepts without codable children: Secondary | ICD-10-CM

## 2015-05-16 DIAGNOSIS — M150 Primary generalized (osteo)arthritis: Secondary | ICD-10-CM

## 2015-05-16 DIAGNOSIS — M159 Polyosteoarthritis, unspecified: Secondary | ICD-10-CM

## 2015-05-16 DIAGNOSIS — Z0000 Encounter for general adult medical examination without abnormal findings: Secondary | ICD-10-CM | POA: Diagnosis not present

## 2015-05-16 DIAGNOSIS — I2510 Atherosclerotic heart disease of native coronary artery without angina pectoris: Secondary | ICD-10-CM | POA: Diagnosis not present

## 2015-05-16 DIAGNOSIS — I10 Essential (primary) hypertension: Secondary | ICD-10-CM | POA: Diagnosis not present

## 2015-05-16 DIAGNOSIS — Z7189 Other specified counseling: Secondary | ICD-10-CM | POA: Insufficient documentation

## 2015-05-16 DIAGNOSIS — E1165 Type 2 diabetes mellitus with hyperglycemia: Secondary | ICD-10-CM

## 2015-05-16 DIAGNOSIS — E785 Hyperlipidemia, unspecified: Secondary | ICD-10-CM

## 2015-05-16 DIAGNOSIS — F3341 Major depressive disorder, recurrent, in partial remission: Secondary | ICD-10-CM

## 2015-05-16 LAB — COMPREHENSIVE METABOLIC PANEL
ALBUMIN: 4.1 g/dL (ref 3.5–5.2)
ALT: 82 U/L — ABNORMAL HIGH (ref 0–53)
AST: 89 U/L — AB (ref 0–37)
Alkaline Phosphatase: 83 U/L (ref 39–117)
BUN: 14 mg/dL (ref 6–23)
CHLORIDE: 96 meq/L (ref 96–112)
CO2: 28 mEq/L (ref 19–32)
Calcium: 9.8 mg/dL (ref 8.4–10.5)
Creatinine, Ser: 0.9 mg/dL (ref 0.40–1.50)
GFR: 89.69 mL/min (ref >60.00)
GLUCOSE: 262 mg/dL — AB (ref 70–99)
Potassium: 4.7 mEq/L (ref 3.5–5.1)
Sodium: 131 mEq/L — ABNORMAL LOW (ref 135–145)
Total Bilirubin: 0.8 mg/dL (ref 0.2–1.2)
Total Protein: 7.4 g/dL (ref 6.0–8.3)

## 2015-05-16 LAB — CBC WITH DIFFERENTIAL/PLATELET
BASOS ABS: 0 10*3/uL (ref 0.0–0.1)
BASOS PCT: 0.4 % (ref 0.0–3.0)
EOS ABS: 0.2 10*3/uL (ref 0.0–0.7)
Eosinophils Relative: 3.8 % (ref 0.0–5.0)
HCT: 41.3 % (ref 39.0–52.0)
HEMOGLOBIN: 13.7 g/dL (ref 13.0–17.0)
LYMPHS ABS: 1.8 10*3/uL (ref 0.7–4.0)
Lymphocytes Relative: 29 % (ref 12.0–46.0)
MCHC: 33.2 g/dL (ref 30.0–36.0)
MCV: 100.8 fl — ABNORMAL HIGH (ref 78.0–100.0)
Monocytes Absolute: 0.8 10*3/uL (ref 0.1–1.0)
Monocytes Relative: 11.8 % (ref 3.0–12.0)
NEUTROS ABS: 3.5 10*3/uL (ref 1.4–7.7)
Neutrophils Relative %: 55 % (ref 43.0–77.0)
Platelets: 130 10*3/uL — ABNORMAL LOW (ref 150.0–400.0)
RBC: 4.09 Mil/uL — ABNORMAL LOW (ref 4.22–5.81)
RDW: 15.8 % — ABNORMAL HIGH (ref 11.5–15.5)
WBC: 6.4 10*3/uL (ref 4.0–10.5)

## 2015-05-16 LAB — LIPID PANEL
CHOLESTEROL: 132 mg/dL (ref 0–200)
HDL: 34.5 mg/dL — AB (ref >39.00)
LDL Cholesterol: 81 mg/dL (ref 0–99)
NonHDL: 97.5
TRIGLYCERIDES: 84 mg/dL (ref 0.0–149.0)
Total CHOL/HDL Ratio: 4
VLDL: 16.8 mg/dL (ref 0.0–40.0)

## 2015-05-16 LAB — T4, FREE: Free T4: 0.87 ng/dL (ref 0.60–1.60)

## 2015-05-16 LAB — HEMOGLOBIN A1C: Hgb A1c MFr Bld: 10.5 % — ABNORMAL HIGH (ref 4.6–6.5)

## 2015-05-16 MED ORDER — ZOSTER VACCINE LIVE 19400 UNT/0.65ML ~~LOC~~ SOLR: 0.6500 mL | Freq: Once | SUBCUTANEOUS | Status: DC

## 2015-05-16 NOTE — Assessment & Plan Note (Signed)
If not better, will send to endocrine in [place]

## 2015-05-16 NOTE — Assessment & Plan Note (Signed)
BP Readings from Last 3 Encounters:  05/16/15 128/78  10/09/14 120/70  07/09/14 118/80   Good control

## 2015-05-16 NOTE — Assessment & Plan Note (Signed)
Urged him to do POA See social history

## 2015-05-16 NOTE — Assessment & Plan Note (Signed)
Partial remission On paroxetine long term

## 2015-05-16 NOTE — Assessment & Plan Note (Signed)
I have personally reviewed the Medicare Annual Wellness questionnaire and have noted 1. The patient's medical and social history 2. Their use of alcohol, tobacco or illicit drugs 3. Their current medications and supplements 4. The patient's functional ability including ADL's, fall risks, home safety risks and hearing or visual             impairment. 5. Diet and physical activities 6. Evidence for depression or mood disorders  The patients weight, height, BMI and visual acuity have been recorded in the chart I have made referrals, counseling and provided education to the patient based review of the above and I have provided the pt with a written personalized care plan for preventive services.  I have provided you with a copy of your personalized plan for preventive services. Please take the time to review along with your updated medication list.  Rx for zostavax Will defer PSA Colonoscopy due next year

## 2015-05-16 NOTE — Assessment & Plan Note (Signed)
Thumbs and left hip Discussed aleve Check x ray

## 2015-05-16 NOTE — Assessment & Plan Note (Signed)
No problems with statin 

## 2015-05-16 NOTE — Progress Notes (Signed)
Subjective:    Patient ID: David [name], male    DOB: March 02, 1951, 64 y.o.   MRN: [id number]  HPI Here for initial Medicare wellness and follow up of chronic medical conditions Reviewed other physicians Reviewed form and advanced directives Has 3-4 beers a day. Not really self medicating No tobacco No falls Tries to exercise with some cardio work Independent with instrumental ADLs Vision and hearing are okay Notes some memory issues--relates to his stress  Reviewed diabetes No "radical change" in lifestyle Not checking sugars Did get eye exam Some pain in feet at times--not neuropathic or constant. 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 aspirin 81 MG tablet Take 81 mg by mouth daily.      [name] atorvastatin (LIPITOR) 40 MG tablet TAKE ONE TABLET BY MOUTH ONCE DAILY 30 tablet 3  . clopidogrel (PLAVIX) 75 MG tablet Take 1 tablet (75 mg total) by mouth daily. 90 tablet 1  . glipiZIDE (GLUCOTROL) 10 MG tablet Take 1 tablet (10 mg total) by mouth 2 (two) times daily before a meal. 60 tablet 11  . glucose blood (ONE TOUCH TEST STRIPS) test strip 1 each by Other route daily. Dx: 250.02 100 each 1  . losartan (COZAAR) 100 MG tablet TAKE ONE TABLET BY MOUTH ONCE DAILY 30 tablet 3  . metFORMIN (GLUCOPHAGE) 1000 MG tablet Take 1 tablet (1,000 mg total) by mouth 2 (two) times daily with a meal. 60 tablet 2  .  metoprolol succinate (TOPROL-XL) 25 MG 24 hr tablet TAKE ONE TABLET BY MOUTH ONCE DAILY 30 tablet 2  . ONE TOUCH LANCETS MISC by Does not apply route as directed.      [name] PARoxetine (PAXIL) 20 MG tablet TAKE TWO TABLETS BY MOUTH ONCE DAILY FOR  DEPRESSION 60 tablet 11  . sildenafil (REVATIO) 20 MG tablet Take 3-5 tablets (60-100 mg total) by mouth daily as needed. 50 tablet 11  . triamcinolone cream (KENALOG) 0.1 % Apply topically 2 (two) times daily. 45 g 0   No current facility-administered medications on file prior to visit.    Allergies  Allergen Reactions  . Venlafaxine     Slowed his urine stream    Past Medical History  Diagnosis Date  . Allergy   . Depression   . Diabetes mellitus   . Arthritis   . Sleep apnea     CPAP  . CAD (coronary artery disease)     s/p bifurcation stenting (DES x2) and stenting bifurcation lesion distal RCA (1 DES)  . Hyperlipemia   . Hypertension     Past Surgical History  Procedure Laterality Date  . Tonsillectomy  1959  . Coronary angioplasty with stent placement   March 2010    Dr. Olevia Perches    Family History  Problem Relation Age of Onset  . Heart failure Father     Deceased 51 y/o  .  Diabetes Father   . Cancer Father     Prostate  . Heart failure Mother     Deceased 67 y/o  . Schizophrenia Son     Paranoid  . Uveitis Son   . Cancer Brother     prostate  . Colon cancer Neg Hx   . Esophageal cancer Neg Hx   . Stomach cancer Neg Hx     History   Social History  . Marital Status: Married    Spouse Name: N/A  . Number of Children: N/A  . Years of Education: N/A   Occupational History  . 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 Smoking status: Former Smoker    Types: Cigarettes    Quit date: 11/30/1983  . Smokeless tobacco: Never Used  . Alcohol Use: 2.5 oz/week    5 drink(s) per week  . Drug Use: No  . Sexual Activity: Not on file   Other Topics  Concern  . Not on file   Social History Narrative   No living will   Would want wife, then son [name], as health care POA   Would accept resuscitation   Not sure about tube feeds   Review of Systems Still uses the sildenafil at times Sleeps fairly well Nocturia x 3--?related to beer. No daytime problems No suspicious lesions. Uses cream for psoriasis Teeth okay--regular with dentist Wears seat belt Bowels are fine Appetite is good. Weight is stable    Objective:   Physical Exam  Constitutional: He is oriented to person, place, and time.  Musculoskeletal:  Fairly normal ROM of left hip  Neurological: He is alert and oriented to person, place, and time.  President -- "Obama, [place]" [phone] [id number] Recall 3/3  Sensation normal in feet  Skin: No rash noted. No erythema.  No foot lesions  Psychiatric: He has a normal mood and affect. His behavior is normal.          Assessment & Plan:

## 2015-05-16 NOTE — Assessment & Plan Note (Signed)
No angina Keeps up with cardiologist

## 2015-05-16 NOTE — Progress Notes (Signed)
Pre visit review using our clinic review tool, if applicable. No additional management support is needed unless otherwise documented below in the visit note. 

## 2015-05-26 ENCOUNTER — Other Ambulatory Visit: Payer: Self-pay

## 2015-05-26 ENCOUNTER — Telehealth: Payer: Self-pay | Admitting: Internal Medicine

## 2015-05-26 NOTE — Telephone Encounter (Signed)
See result note 05/17/15

## 2015-05-26 NOTE — Telephone Encounter (Signed)
Pt returned your call.  

## 2015-05-28 ENCOUNTER — Other Ambulatory Visit: Payer: Self-pay | Admitting: Cardiovascular Disease

## 2015-05-29 ENCOUNTER — Other Ambulatory Visit: Payer: Self-pay | Admitting: *Deleted

## 2015-05-29 MED ORDER — SITAGLIPTIN PHOSPHATE 100 MG PO TABS: 100.0000 mg | ORAL_TABLET | Freq: Every day | ORAL | Status: DC

## 2015-07-08 ENCOUNTER — Other Ambulatory Visit: Payer: Self-pay | Admitting: Cardiovascular Disease

## 2015-07-28 ENCOUNTER — Other Ambulatory Visit: Payer: Self-pay | Admitting: Family Medicine

## 2015-07-28 NOTE — Telephone Encounter (Signed)
Last office visit 05/16/2015.  Last refilled 02/04/2015 for 45 grams  Ok to refill?

## 2015-07-29 NOTE — Telephone Encounter (Signed)
Approved: okay #45 gm x 2 

## 2015-07-31 ENCOUNTER — Other Ambulatory Visit: Payer: Self-pay | Admitting: Cardiovascular Disease

## 2015-08-01 ENCOUNTER — Other Ambulatory Visit: Payer: Self-pay | Admitting: Internal Medicine

## 2015-08-01 ENCOUNTER — Other Ambulatory Visit (INDEPENDENT_AMBULATORY_CARE_PROVIDER_SITE_OTHER): Payer: Medicare Other

## 2015-08-01 DIAGNOSIS — E119 Type 2 diabetes mellitus without complications: Secondary | ICD-10-CM

## 2015-08-01 DIAGNOSIS — E1165 Type 2 diabetes mellitus with hyperglycemia: Principal | ICD-10-CM

## 2015-08-01 DIAGNOSIS — IMO0001 Reserved for inherently not codable concepts without codable children: Secondary | ICD-10-CM

## 2015-08-01 LAB — HEMOGLOBIN A1C: HEMOGLOBIN A1C: 10.1 % — AB (ref 4.6–6.5)

## 2015-08-11 ENCOUNTER — Encounter: Payer: Self-pay | Admitting: Cardiovascular Disease

## 2015-08-11 ENCOUNTER — Ambulatory Visit (INDEPENDENT_AMBULATORY_CARE_PROVIDER_SITE_OTHER): Payer: Medicare Other | Admitting: Cardiovascular Disease

## 2015-08-11 VITALS — BP 126/82 | HR 61 | Ht 70.0 in | Wt 195.4 lb

## 2015-08-11 DIAGNOSIS — E785 Hyperlipidemia, unspecified: Secondary | ICD-10-CM | POA: Diagnosis not present

## 2015-08-11 DIAGNOSIS — I10 Essential (primary) hypertension: Secondary | ICD-10-CM

## 2015-08-11 DIAGNOSIS — I2510 Atherosclerotic heart disease of native coronary artery without angina pectoris: Secondary | ICD-10-CM | POA: Diagnosis not present

## 2015-08-11 DIAGNOSIS — E119 Type 2 diabetes mellitus without complications: Secondary | ICD-10-CM | POA: Diagnosis not present

## 2015-08-11 NOTE — Progress Notes (Signed)
Cardiology Office Note Date:  08/11/2015   ID:  David [name], DOB Nov 26, 1951, MRN [id number]  PCP:  David Simpler, MD  Cardiologist:  David Mocha, MD    No chief complaint on file.   History of Present Illness: David [name] is a 64 y.o. male who presents for follow-up evaluation. He underwent complex PCI in 2010 involving the left circumflex/obtuse marginal bifurcation as well as the distal right coronary artery bifurcation. He has been maintained on long-term dual antiplatelets therapy with aspirin and clopidogrel.  The patient is not experiencing any cardiac symptoms. He specifically denies chest pain, chest pressure, shortness of breath, edema, or heart palpitations. He has had more difficulty in control of his diabetes. Recent hemoglobin A1c is 10.1. Notes that he has been drinking more beer in the summer months. Had some exercise but not much "aerobic exercise." He does complain of easy bruising but has not had any bleeding problems.  Past Medical History  Diagnosis Date  . Allergy   . Depression   . Diabetes mellitus   . Arthritis   . Sleep apnea     CPAP  . CAD (coronary artery disease)     s/p bifurcation stenting (DES x2) and stenting bifurcation lesion distal RCA (1 DES)  . Hyperlipemia   . Hypertension     Past Surgical History  Procedure Laterality Date  . Tonsillectomy  1959  . Coronary angioplasty with stent placement   March 2010    Dr. Olevia [name]    Current Outpatient Prescriptions  Medication Sig Dispense Refill  . aspirin 81 MG tablet Take 81 mg by mouth daily.      [name] atorvastatin (LIPITOR) 40 MG tablet TAKE ONE TABLET BY MOUTH ONCE DAILY 30 tablet 0  . clopidogrel (PLAVIX) 75 MG tablet TAKE ONE TABLET BY MOUTH ONCE DAILY 90 tablet 0  . glipiZIDE (GLUCOTROL) 10 MG tablet Take 1 tablet (10 mg total) by mouth 2 (two) times daily before a meal. 60 tablet 11  . losartan (COZAAR) 100 MG tablet TAKE ONE TABLET BY MOUTH ONCE DAILY 30 tablet 0  . metFORMIN  (GLUCOPHAGE) 1000 MG tablet Take 1 tablet (1,000 mg total) by mouth 2 (two) times daily with a meal. 60 tablet 2  . metoprolol succinate (TOPROL-XL) 25 MG 24 hr tablet TAKE ONE TABLET BY MOUTH ONCE DAILY 30 tablet 1  . naproxen sodium (ALEVE) 220 MG tablet Take 220-440 mg by mouth 2 (two) times daily as needed (pain).     [name] PARoxetine (PAXIL) 20 MG tablet TAKE TWO TABLETS BY MOUTH ONCE DAILY FOR  DEPRESSION 60 tablet 11  . sildenafil (REVATIO) 20 MG tablet Take 60-100 mg by mouth daily as needed (for ED).     [name] sitaGLIPtin (JANUVIA) 100 MG tablet Take 1 tablet (100 mg total) by mouth daily. 30 tablet 11  . triamcinolone cream (KENALOG) 0.1 % APPLY  CREAM EXTERNALLY TWICE DAILY 45 g 2  . zoster vaccine live, PF, (ZOSTAVAX) 51761 UNT/0.65ML injection Inject 19,400 Units into the skin once. 1 each 0   No current facility-administered medications for this visit.    Allergies:   Venlafaxine   Social History:  The patient  reports that he quit smoking about 31 years ago. His smoking use included Cigarettes. He has never used smokeless tobacco. He reports that he drinks about 2.5 oz of alcohol per week. He reports that he does not use illicit drugs.   Family History:  The patient's family history includes Cancer in his  brother and father; Diabetes in his father; Heart failure in his father and mother; Schizophrenia in his son; Uveitis in his son. There is no history of Colon cancer, Esophageal cancer, or Stomach cancer.    ROS:  Please see the history of present illness.  Otherwise, review of systems is positive for depression, back pain, muscle pain, easy bruising.  All other systems are reviewed and negative.    PHYSICAL EXAM: VS:  There were no vitals taken for this visit. , BMI There is no weight on file to calculate BMI. GEN: Well nourished, well developed, in no acute distress HEENT: normal Neck: no JVD, no masses. No carotid bruits Cardiac: RRR without murmur or gallop                  Respiratory:  clear to auscultation bilaterally, normal work of breathing GI: soft, nontender, nondistended, + BS MS: no deformity or atrophy Ext: no pretibial edema, pedal pulses 2+= bilaterally Skin: warm and dry, no rash, multiple bruises on arms Neuro:  Strength and sensation are intact Psych: euthymic mood, full affect  EKG:  EKG is ordered today. The ekg ordered today shows NSR 61 bpm, within normal limits  Recent Labs: 05/16/2015: ALT 82*; BUN 14; Creatinine, Ser 0.90; Hemoglobin 13.7; Platelets 130.0*; Potassium 4.7; Sodium 131*   Lipid Panel     Component Value Date/Time   CHOL 132 05/16/2015 0941   TRIG 84.0 05/16/2015 0941   HDL 34.50* 05/16/2015 0941   CHOLHDL 4 05/16/2015 0941   VLDL 16.8 05/16/2015 0941   LDLCALC 81 05/16/2015 0941   LDLDIRECT 103.9 11/05/2013 1249      Wt Readings from Last 3 Encounters:  05/16/15 191 lb (86.637 kg)  10/09/14 194 lb (87.998 kg)  07/09/14 197 lb (89.359 kg)     Cardiac Studies Reviewed: Cardiac Cath 01/03/09: CONCLUSION: Coronary artery disease with 50% narrowing in the mid LAD, 80% narrowing in the mid circumflex artery with 90% stenosis in the first and second posterolateral branches of the circumflex artery (bifurcation lesion) and 95% stenosis of distal right coronary artery with 95% stenosis in the posterior descending branch of the right coronary artery (bifurcation lesion) and normal LV function.  RECOMMENDATIONS: The patient is quite symptomatic and has critical disease in the circumflex and right coronary arteries. Both lesions are bifurcation lesions, which will make them technically more difficult but I think percutaneous intervention is the best option. We will plan to do the circumflex lesion today and plan on a staged procedure for the right early next week.  PCI 01/03/09: CONCLUSION: Successful PCI of the mid and distal circumflex artery and the posterolateral branch of the circumflex  artery (bifurcation lesion using 2 Xience stents in the main circumflex artery and a third Xience stent in the side branch, which was the posterolateral branch crush technique) with improvement in the main channel from 80% and 90% to 0%, improvement in the side channel from 90% to 0%.  DISPOSITION: The patient returned to [facility] room for further observation. We will reevaluate him tomorrow and plan to treat the right coronary artery on Monday. We will decide whether he should stay in the hospital or whether he may be able to go home tomorrow.  PCI 01/07/09: RESULTS: Initially, the stenosis in the distal right coronary artery into the posterior descending branch was estimated at 95%. Following stenting, this improved to 0%.  Initially stenosis in the distal right coronary artery stenting in the posterolateral branch was 95%. Following cutting  balloon angioplasty, this improved to 30% and following stenting at the ostium, re-narrowed to 80%.  CONCLUSION: Successful percutaneous coronary intervention of a bifurcation lesion in the posterior descending and posterolateral branches of the right coronary artery with improvement in the distal right and posterior descending artery stenosis from 95% to 0% with preservation of flow in the posterolateral branch with 80% residual stenosis at the ostium.  ASSESSMENT AND PLAN: 1.  CAD, native vessel, without symptoms of angina: Doing well from a symptom perspective. We discussed options with antiplatelets therapy. I think with diabetes and complex stenting, it is best to continue dual antiplatelets therapy with aspirin and clopidogrel. The patient agrees and we will continue his same program. He is also on aggressive risk reduction program with a high intensity statin drug, beta blocker, and ARB.  2. Hyperlipidemia: Recent lipids reviewed as above. He will continue current medicines and work on lifestyle  modification.  3. Essential hypertension: Blood pressure well controlled on current medical therapy.  4. Type 2 DM, uncontrolled: Dr Silvio Pate has recommended endocrine consult. I encouraged the patient to follow through with this. Lengthy discussion about the importance of increasing exercise and changing his diet to a low carbohydrate-based diet.  Current medicines are reviewed with the patient today.  The patient does not have concerns regarding medicines.  Labs/ tests ordered today include:  No orders of the defined types were placed in this encounter.   Disposition:   FU one year  Signed, David Mocha, MD  08/11/2015 8:06 AM    [facility] [place], [place], [place]  [address] Phone: [phone]; Fax: [phone]

## 2015-08-11 NOTE — Patient Instructions (Signed)

## 2015-08-14 ENCOUNTER — Other Ambulatory Visit: Payer: Self-pay | Admitting: Internal Medicine

## 2015-08-31 ENCOUNTER — Other Ambulatory Visit: Payer: Self-pay | Admitting: Internal Medicine

## 2015-09-01 ENCOUNTER — Other Ambulatory Visit: Payer: Self-pay | Admitting: Cardiovascular Disease

## 2015-09-11 ENCOUNTER — Other Ambulatory Visit: Payer: Self-pay | Admitting: Cardiovascular Disease

## 2015-09-12 ENCOUNTER — Ambulatory Visit (INDEPENDENT_AMBULATORY_CARE_PROVIDER_SITE_OTHER): Payer: Medicare Other

## 2015-09-12 DIAGNOSIS — Z23 Encounter for immunization: Secondary | ICD-10-CM | POA: Diagnosis not present

## 2015-09-15 ENCOUNTER — Ambulatory Visit: Payer: Self-pay | Admitting: Internal Medicine

## 2015-09-26 ENCOUNTER — Other Ambulatory Visit: Payer: Self-pay | Admitting: Internal Medicine

## 2015-09-26 NOTE — Telephone Encounter (Signed)
Electronic refill request, pt has f/u scheduled on 11/18/15, last refilled on 09/13/14 #60 with 11 additional refills, please advise

## 2015-09-26 NOTE — Telephone Encounter (Signed)
done

## 2015-09-26 NOTE — Telephone Encounter (Signed)
Approved: okay to fill for a year 

## 2015-10-06 ENCOUNTER — Encounter: Payer: Self-pay | Admitting: Internal Medicine

## 2015-10-06 ENCOUNTER — Ambulatory Visit (INDEPENDENT_AMBULATORY_CARE_PROVIDER_SITE_OTHER): Payer: Medicare Other | Admitting: Internal Medicine

## 2015-10-06 VITALS — BP 112/68 | HR 65 | Temp 98.5°F | Resp 12 | Ht 70.0 in | Wt 191.4 lb

## 2015-10-06 DIAGNOSIS — E1165 Type 2 diabetes mellitus with hyperglycemia: Secondary | ICD-10-CM | POA: Diagnosis not present

## 2015-10-06 DIAGNOSIS — E1159 Type 2 diabetes mellitus with other circulatory complications: Secondary | ICD-10-CM | POA: Diagnosis not present

## 2015-10-06 MED ORDER — INSULIN PEN NEEDLE 32G X 4 MM MISC
Status: DC
Start: 2015-10-06 — End: 2017-09-27

## 2015-10-06 MED ORDER — INSULIN GLARGINE 100 UNIT/ML SOLOSTAR PEN: 18.0000 [IU] | PEN_INJECTOR | Freq: Every day | SUBCUTANEOUS | Status: DC

## 2015-10-06 NOTE — Patient Instructions (Signed)
Please continue: - Metformin 1000 mg 2x a day, with meals  - Glipizide 10 mg 2x a day - Januvia 100 mg in am   Please start Lantus 15 units at bedtime. Increase to 18 units in 4-5 days if sugars in am are not <150.  When injecting insulin:  Inject in the abdomen  Rotate the injection sites around the belly button  Change needle for each injection  Keep needle in for 10 sec after last unit of insulin in  Keep the insulin in use out of the fridge  Please let me know if the sugars are consistently <80 or >200.  Please return in 1-1.5 months with your sugar log.   PATIENT INSTRUCTIONS FOR TYPE 2 DIABETES:  **Please join MyChart!** - see attached instructions about how to join if you have not done so already.  DIET AND EXERCISE Diet and exercise is an important part of diabetic treatment.  We recommended aerobic exercise in the form of brisk walking (working between 40-60% of maximal aerobic capacity, similar to brisk walking) for 150 minutes per week (such as 30 minutes five days per week) along with 3 times per week performing 'resistance' training (using various gauge rubber tubes with handles) 5-10 exercises involving the major muscle groups (upper body, lower body and core) performing 10-15 repetitions (or near fatigue) each exercise. Start at half the above goal but build slowly to reach the above goals. If limited by weight, joint pain, or disability, we recommend daily walking in a swimming pool with water up to waist to reduce pressure from joints while allow for adequate exercise.    BLOOD GLUCOSES Monitoring your blood glucoses is important for continued management of your diabetes. Please check your blood glucoses 2-4 times a day: fasting, before meals and at bedtime (you can rotate these measurements - e.g. one day check before the 3 meals, the next day check before 2 of the meals and before bedtime, etc.).   HYPOGLYCEMIA (low blood sugar) Hypoglycemia is usually a reaction  to not eating, exercising, or taking too much insulin/ other diabetes drugs.  Symptoms include tremors, sweating, hunger, confusion, headache, etc. Treat IMMEDIATELY with 15 grams of Carbs: . 4 glucose tablets .  cup regular juice/soda . 2 tablespoons raisins . 4 teaspoons sugar . 1 tablespoon honey Recheck blood glucose in 15 mins and repeat above if still symptomatic/blood glucose <100.  RECOMMENDATIONS TO REDUCE YOUR RISK OF DIABETIC COMPLICATIONS: * Take your prescribed MEDICATION(S) * Follow a DIABETIC diet: Complex carbs, fiber rich foods, (monounsaturated and polyunsaturated) fats * AVOID saturated/trans fats, high fat foods, >2,300 mg salt per day. * EXERCISE at least 5 times a week for 30 minutes or preferably daily.  * DO NOT SMOKE OR DRINK more than 1 drink a day. * Check your FEET every day. Do not wear tightfitting shoes. Contact us if you develop an ulcer * See your EYE doctor once a year or more if needed * Get a FLU shot once a year * Get a PNEUMONIA vaccine once before and once after age 47 years  GOALS:  * Your Hemoglobin A1c of <7%  * fasting sugars need to be <130 * after meals sugars need to be <180 (2h after you start eating) * Your Systolic BP should be 361 or lower  * Your Diastolic BP should be 80 or lower  * Your HDL (Good Cholesterol) should be 40 or higher  * Your LDL (Bad Cholesterol) should be 100 or lower. *  Your Triglycerides should be 150 or lower  * Your Urine microalbumin (kidney function) should be <30 * Your Body Mass Index should be 25 or lower    Please consider the following ways to cut down carbs and fat and increase fiber and micronutrients in your diet: - substitute whole grain for white bread or pasta - substitute brown rice for white rice - substitute 90-calorie flat bread pieces for slices of bread when possible - substitute sweet potatoes or yams for white potatoes - substitute humus for margarine - substitute tofu for cheese  when possible - substitute almond or rice milk for regular milk (would not drink soy milk daily due to concern for soy estrogen influence on breast cancer risk) - substitute dark chocolate for other sweets when possible - substitute water - can add lemon or orange slices for taste - for diet sodas (artificial sweeteners will trick your body that you can eat sweets without getting calories and will lead you to overeating and weight gain in the long run) - do not skip breakfast or other meals (this will slow down the metabolism and will result in more weight gain over time)  - can try smoothies made from fruit and almond/rice milk in am instead of regular breakfast - can also try old-fashioned (not instant) oatmeal made with almond/rice milk in am - order the dressing on the side when eating salad at a restaurant (pour less than half of the dressing on the salad) - eat as little meat as possible - can try juicing, but should not forget that juicing will get rid of the fiber, so would alternate with eating raw veg./fruits or drinking smoothies - use as little oil as possible, even when using olive oil - can dress a salad with a mix of balsamic vinegar and lemon juice, for e.g. - use agave nectar, stevia sugar, or regular sugar rather than artificial sweateners - steam or broil/roast veggies  - snack on veggies/fruit/nuts (unsalted, preferably) when possible, rather than processed foods - reduce or eliminate aspartame in diet (it is in diet sodas, chewing gum, etc) Read the labels!  Try to read Dr. Janene Harvey book: "Program for Reversing Diabetes" for other ideas for healthy eating.

## 2015-10-06 NOTE — Progress Notes (Signed)
Patient ID: David [name], male   DOB: 06/19/1951, 64 y.o.   MRN: [id number]   HPI: David [name] is a 64 y.o.-year-old male, referred by his PCP, Dr.Letvak, for management of DM2, dx in ~2008, non-insulin-dependent, uncontrolled, with complications (CAD - s/p stents in 2013, PN).  Last hemoglobin A1c was: Lab Results  Component Value Date   HGBA1C 10.1* 08/01/2015   HGBA1C 10.5* 05/16/2015   HGBA1C 11.2* 10/09/2014   Pt is on a regimen of: - Metformin 1000 mg 2x a day, with meals >> no SEs - Glipizide 10 mg 2x a day - Januvia 100 mg in am - started 03/2015  Pt checks his sugars 0-1x a day and they are: - am: 180-220 - 2h after b'fast: n/c - before lunch: n/c - 2h after lunch: n/c - before dinner: n/c - 2h after dinner: n/c - bedtime: n/c - nighttime: n/c No lows. Lowest sugar was 154; ? hypoglycemia awareness Highest sugar was 240.  Glucometer: One Touch  Pt's meals are: - Breakfast: protein shake - Lunch: n/a - Dinner: meat + veggies /potatoes or salad - Snacks: no No deserts. Diet sodas, ice tea with splenda.   - no CKD, last BUN/creatinine:  Lab Results  Component Value Date   BUN 14 05/16/2015   CREATININE 0.90 05/16/2015  He is on Losartan. - last set of lipids: Lab Results  Component Value Date   CHOL 132 05/16/2015   HDL 34.50* 05/16/2015   LDLCALC 81 05/16/2015   LDLDIRECT 103.9 11/05/2013   TRIG 84.0 05/16/2015   CHOLHDL 4 05/16/2015  He is on Atorvastatin. - last eye exam was in 08/2015. No DR.  - + numbness and tingling in his feet.  Pt has FH of DM in father.  He also has HL, HTN, depression. He has a history of anabolic steroid use for muscle building.  ROS: Constitutional: no weight gain/loss, + fatigue, + both subjective hyperthermia/hypothermia, + excessive urination and nocturia Eyes: +blurry vision, no xerophthalmia ENT: no sore throat, no nodules palpated in throat, no dysphagia/odynophagia, no hoarseness Cardiovascular: + CP/no  SOB/palpitations/leg swelling Respiratory: no cough/SOB Gastrointestinal: + Heartburn/ + N/no V/D/+ C Musculoskeletal: + Both: muscle/joint aches Skin: + Easy bruising, hair loss, itching Neurological: no tremors/numbness/tingling/dizziness, + headache Psychiatric: no depression/anxiety + Low libido, difficulty with erections, enlarged breasts  Past Medical History  Diagnosis Date  . Allergy   . Depression   . Diabetes mellitus   . Arthritis   . Sleep apnea     CPAP  . CAD (coronary artery disease)     s/p bifurcation stenting (DES x2) and stenting bifurcation lesion distal RCA (1 DES)  . Hyperlipemia   . Hypertension    Past Surgical History  Procedure Laterality Date  . Tonsillectomy  1959  . Coronary angioplasty with stent placement   March 2010    Dr. Olevia Perches   Social History   Social History  . Marital Status: Married    Spouse Name: N/A  . Number of Children: 2   Occupational History  . 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 Smoking status: Former Smoker    Types: Cigarettes    Quit date: 11/30/1983  . Smokeless tobacco: Never Used  . Alcohol Use:  beer, 3-4 times a week, 6-8 drinks at the time       . Drug Use: No   Social History Narrative   No  living will   Would want wife, then son [name], as health care POA   Would accept resuscitation   Not sure about tube feeds   Current Outpatient Prescriptions on File Prior to Visit  Medication Sig Dispense Refill  . aspirin 81 MG tablet Take 81 mg by mouth daily.      [name] atorvastatin (LIPITOR) 40 MG tablet TAKE ONE TABLET BY MOUTH ONCE DAILY 30 tablet 11  . clopidogrel (PLAVIX) 75 MG tablet TAKE ONE TABLET BY MOUTH ONCE DAILY 90 tablet 3  . glipiZIDE (GLUCOTROL) 10 MG tablet TAKE 1 TABLET BY MOUTH TWICE A DAY BEFORE A MEAL 60 tablet 0  . losartan (COZAAR) 100 MG tablet TAKE ONE TABLET BY MOUTH ONCE DAILY 30 tablet 11  . metFORMIN (GLUCOPHAGE) 1000 MG  tablet TAKE ONE TABLET BY MOUTH TWICE DAILY WITH MEALS 60 tablet 11  . metoprolol succinate (TOPROL-XL) 25 MG 24 hr tablet TAKE ONE TABLET BY MOUTH ONCE DAILY 30 tablet 6  . naproxen sodium (ALEVE) 220 MG tablet Take 220-440 mg by mouth 2 (two) times daily as needed (pain).     [name] PARoxetine (PAXIL) 20 MG tablet TAKE TWO TABLETS BY MOUTH ONCE DAILY FOR DEPRESSION 60 tablet 11  . sildenafil (REVATIO) 20 MG tablet Take 60-100 mg by mouth daily as needed (for ED).     [name] sitaGLIPtin (JANUVIA) 100 MG tablet Take 1 tablet (100 mg total) by mouth daily. 30 tablet 11  . triamcinolone cream (KENALOG) 0.1 % APPLY  CREAM EXTERNALLY TWICE DAILY 45 g 2  . zoster vaccine live, PF, (ZOSTAVAX) 34193 UNT/0.65ML injection Inject 19,400 Units into the skin once. 1 each 0   No current facility-administered medications on file prior to visit.   Allergies  Allergen Reactions  . Venlafaxine     Slowed his urine stream   Family History  Problem Relation Age of Onset  . Heart failure Father     Deceased 43 y/o  . Diabetes Father   . Cancer Father     Prostate  . Heart failure Mother     Deceased 91 y/o  . Schizophrenia Son     Paranoid  . Uveitis Son   . Cancer Brother     prostate  . Colon cancer Neg Hx   . Esophageal cancer Neg Hx   . Stomach cancer Neg Hx    PE: BP 112/68 mmHg  Pulse 65  Temp(Src) 98.5 F (36.9 C) (Oral)  Resp 12  Ht 5\' 10"  (1.778 m)  Wt 191 lb 6.4 oz (86.818 kg)  BMI 27.46 kg/m2  SpO2 97% Wt Readings from Last 3 Encounters:  10/06/15 191 lb 6.4 oz (86.818 kg)  08/11/15 195 lb 6.4 oz (88.633 kg)  05/16/15 191 lb (86.637 kg)   Constitutional: Slightly overweight, in NAD Eyes: PERRLA, EOMI, no exophthalmos ENT: moist mucous membranes, no thyromegaly, no cervical lymphadenopathy Cardiovascular: RRR, No MRG Respiratory: CTA B Gastrointestinal: abdomen soft, NT, ND, BS+ Musculoskeletal: no deformities, strength intact in all 4 Skin: moist, warm, no rashes Neurological:  no tremor with outstretched hands, DTR normal in all 4  ASSESSMENT: 1. DM2, non-insulin-dependent, uncontrolled, with complications - CAD - s/p 4 stents 2013 - seeing Dr Burt Knack - PN  2. Excessive Alcohol intake  PLAN:  1. Patient with long-standing, uncontrolled diabetes, on oral antidiabetic regimen, with improvement of HbA1c in last months, but still very high. His a.m., fasting, sugars are very high, 180-220, while on maximum doses of metformin, glipizide, Januvia. I  suspect that he may have a decrease of insulin deficiency, but we cannot check a C-peptide quite now, with such a high CBGs. We need to wait until his sugars are lower than approximately 180 to check this. For now, I suggested that we started basal insulin, Lantus, at 15 units nightly, and increase as needed. We discussed at length about correct injection techniques and I demonstrated pen use. I also gave him a brochure containing insulin injection techniques. - He refuses a referral to diabetes education or nutrition - I suggested to:  Patient Instructions  Please continue: - Metformin 1000 mg 2x a day, with meals  - Glipizide 10 mg 2x a day - Januvia 100 mg in am   Please start Lantus 15 units at bedtime. Increase to 18 units in 4-5 days if sugars in am are not <150.  When injecting insulin:  Inject in the abdomen  Rotate the injection sites around the belly button  Change needle for each injection  Keep needle in for 10 sec after last unit of insulin in  Keep the insulin in use out of the fridge  Please let me know if the sugars are consistently <80 or >200.  Please return in 1-1.5 months with your sugar log.   - Strongly advised him to start checking sugars at different times of the day - check 2 times a day, rotating checks - given sugar log and advised how to fill it and to bring it at next appt  - given foot care handout and explained the principles  - given instructions for hypoglycemia management  "15-15 rule"  - advised for yearly eye exams >> he is UTD - had flu shot this season - Return to clinic in 1.5 mo with sugar log   2. Excessive alcohol intake - He knows his alcohol intake is too high - Advised to cut down

## 2015-10-07 ENCOUNTER — Other Ambulatory Visit: Payer: Self-pay | Admitting: *Deleted

## 2015-10-07 MED ORDER — GLUCOSE BLOOD VI STRP: ORAL_STRIP | Status: DC

## 2015-10-07 MED ORDER — ONETOUCH DELICA LANCETS FINE MISC: Status: DC

## 2015-10-13 ENCOUNTER — Other Ambulatory Visit: Payer: Self-pay | Admitting: *Deleted

## 2015-10-13 ENCOUNTER — Encounter: Payer: Self-pay | Admitting: Internal Medicine

## 2015-10-13 MED ORDER — INSULIN GLARGINE 100 UNIT/ML SOLOSTAR PEN: 18.0000 [IU] | PEN_INJECTOR | Freq: Every day | SUBCUTANEOUS | Status: DC

## 2015-10-13 NOTE — Telephone Encounter (Signed)
Pt contacted [place] via MyChart and advised that his insulin rx did not go through to his pharmacy. Advised pt we will resend it.

## 2015-10-23 ENCOUNTER — Other Ambulatory Visit: Payer: Self-pay | Admitting: Internal Medicine

## 2015-11-18 ENCOUNTER — Ambulatory Visit: Payer: Self-pay | Admitting: Internal Medicine

## 2015-11-21 ENCOUNTER — Ambulatory Visit: Payer: Self-pay | Admitting: Internal Medicine

## 2015-12-02 DIAGNOSIS — M79642 Pain in left hand: Secondary | ICD-10-CM | POA: Insufficient documentation

## 2015-12-02 DIAGNOSIS — M79641 Pain in right hand: Secondary | ICD-10-CM | POA: Insufficient documentation

## 2015-12-02 DIAGNOSIS — M180 Bilateral primary osteoarthritis of first carpometacarpal joints: Secondary | ICD-10-CM | POA: Insufficient documentation

## 2015-12-07 ENCOUNTER — Other Ambulatory Visit: Payer: Self-pay | Admitting: Internal Medicine

## 2015-12-09 ENCOUNTER — Encounter: Payer: Self-pay | Admitting: Internal Medicine

## 2015-12-09 ENCOUNTER — Ambulatory Visit (INDEPENDENT_AMBULATORY_CARE_PROVIDER_SITE_OTHER): Payer: Medicare Other | Admitting: Internal Medicine

## 2015-12-09 VITALS — BP 132/80 | HR 74 | Temp 97.7°F | Wt 191.0 lb

## 2015-12-09 DIAGNOSIS — E1165 Type 2 diabetes mellitus with hyperglycemia: Secondary | ICD-10-CM

## 2015-12-09 DIAGNOSIS — E1159 Type 2 diabetes mellitus with other circulatory complications: Secondary | ICD-10-CM

## 2015-12-09 DIAGNOSIS — M791 Myalgia, unspecified site: Secondary | ICD-10-CM

## 2015-12-09 NOTE — Assessment & Plan Note (Signed)
Now on lantus and sugars better Has follow up next week with Dr Cruzita Lederer

## 2015-12-09 NOTE — Progress Notes (Signed)
Subjective:    Patient ID: David [name], male    DOB: [birth year], 65 y.o.   MRN: [id number]  HPI Here for follow up of diabetes and other chronic medical  Conditions  Did see Dr Cruzita Lederer Started the lantus AM sugars do seem to be better Mild temporary stinging in feet at times  Mood has been okay Feels this is controlled Works every day still--rare days off (2 per month) 5-7 hours a day  Current Outpatient Prescriptions on File Prior to Visit  Medication Sig Dispense Refill  . aspirin 81 MG tablet Take 81 mg by mouth daily.      [name] atorvastatin (LIPITOR) 40 MG tablet TAKE ONE TABLET BY MOUTH ONCE DAILY 30 tablet 11  . clopidogrel (PLAVIX) 75 MG tablet TAKE ONE TABLET BY MOUTH ONCE DAILY 90 tablet 3  . glipiZIDE (GLUCOTROL) 10 MG tablet TAKE 1 TABLET BY MOUTH TWICE A DAY BEFORE A MEAL 60 tablet 0  . glucose blood (ONE TOUCH ULTRA TEST) test strip Use to test blood sugar 2 times daily as instructed. Dx: E11.59, E11.65 100 each 5  . Insulin Glargine (LANTUS SOLOSTAR) 100 UNIT/ML Solostar Pen Inject 18 Units into the skin daily at 10 pm. 5 pen 1  . Insulin Pen Needle 32G X 4 MM MISC Use as advised, once a day 100 each 11  . losartan (COZAAR) 100 MG tablet TAKE ONE TABLET BY MOUTH ONCE DAILY 30 tablet 11  . metFORMIN (GLUCOPHAGE) 1000 MG tablet TAKE ONE TABLET BY MOUTH TWICE DAILY WITH MEALS 60 tablet 11  . metoprolol succinate (TOPROL-XL) 25 MG 24 hr tablet TAKE ONE TABLET BY MOUTH ONCE DAILY 30 tablet 6  . naproxen sodium (ALEVE) 220 MG tablet Take 220-440 mg by mouth 2 (two) times daily as needed (pain).     Glory Rosebush DELICA LANCETS FINE MISC Use to test blood sugar 2 times daily as instructed. Dx: E11.59, E11.65 100 each 5  . PARoxetine (PAXIL) 20 MG tablet TAKE TWO TABLETS BY MOUTH ONCE DAILY FOR DEPRESSION 60 tablet 11  . sildenafil (REVATIO) 20 MG tablet Take 60-100 mg by mouth daily as needed (for ED).     [name] sitaGLIPtin (JANUVIA) 100 MG tablet Take 1 tablet (100 mg total) by  mouth daily. 30 tablet 11  . triamcinolone cream (KENALOG) 0.1 % APPLY  CREAM EXTERNALLY TWICE DAILY 45 g 2   No current facility-administered medications on file prior to visit.    Allergies  Allergen Reactions  . Venlafaxine     Slowed his urine stream    Past Medical History  Diagnosis Date  . Allergy   . Depression   . Diabetes mellitus   . Arthritis   . Sleep apnea     CPAP  . CAD (coronary artery disease)     s/p bifurcation stenting (DES x2) and stenting bifurcation lesion distal RCA (1 DES)  . Hyperlipemia   . Hypertension     Past Surgical History  Procedure Laterality Date  . Tonsillectomy  1959  . Coronary angioplasty with stent placement   March 2010    Dr. Olevia Perches    Family History  Problem Relation Age of Onset  . Heart failure Father     Deceased 28 y/o  . Diabetes Father   . Cancer Father     Prostate  . Heart failure Mother     Deceased 30 y/o  . Schizophrenia Son     Paranoid  . Uveitis Son   .  Cancer Brother     prostate  . Colon cancer Neg Hx   . Esophageal cancer Neg Hx   . Stomach cancer Neg Hx     Social History   Social History  . Marital Status: Married    Spouse Name: N/A  . Number of Children: N/A  . Years of Education: N/A   Occupational History  . 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 Smoking status: Former Smoker    Types: Cigarettes    Quit date: 11/30/1983  . Smokeless tobacco: Never Used  . Alcohol Use: 2.5 oz/week    5 drink(s) per week  . Drug Use: No  . Sexual Activity: Not on file   Other Topics Concern  . Not on file   Social History Narrative   No living will   Would want wife, then son [name], as health care POA   Would accept resuscitation   Not sure about tube feeds   Review of Systems Sleep is not great --awakening in pain in legs. Mostly on sides of both legs. Some pain in right arm at times if he sleeps on that side Just had  shots done in hands for arthritis-- Dr Burney Gauze (hand center) Seems to be muscle pain in legs Aleve doesn't seem to help    Objective:   Physical Exam  Constitutional: He appears well-developed and well-nourished. No distress.  Neck: Normal range of motion. Neck supple. No thyromegaly present.  Cardiovascular: Normal rate, regular rhythm and normal heart sounds.  Exam reveals no gallop.   No murmur heard. Pulmonary/Chest: Effort normal and breath sounds normal. No respiratory distress. He has no wheezes. He has no rales.  Musculoskeletal:  No joint swelling or tenderness Several tender muscle trigger points  Lymphadenopathy:    He has no cervical adenopathy.  Skin: No rash noted.  Psychiatric: He has a normal mood and affect. His behavior is normal.          Assessment & Plan:

## 2015-12-09 NOTE — Patient Instructions (Signed)
Please try off the atorvastatin for a few weeks. If the muscle pain improves or resolves, we will officially stop this and I will try a different cholesterol medication.

## 2015-12-09 NOTE — Progress Notes (Signed)
Pre visit review using our clinic review tool, if applicable. No additional management support is needed unless otherwise documented below in the visit note. 

## 2015-12-09 NOTE — Assessment & Plan Note (Signed)
Doesn't sound like true myositis so will hold off on labs Trial off atorvastatin--if pain improves, will change to a different statin

## 2016-01-09 ENCOUNTER — Ambulatory Visit: Payer: Self-pay | Admitting: Internal Medicine

## 2016-01-11 ENCOUNTER — Other Ambulatory Visit: Payer: Self-pay | Admitting: Internal Medicine

## 2016-01-18 ENCOUNTER — Encounter: Payer: Self-pay | Admitting: Internal Medicine

## 2016-01-19 MED ORDER — ROSUVASTATIN CALCIUM 10 MG PO TABS: 10.0000 mg | ORAL_TABLET | Freq: Every day | ORAL | Status: DC

## 2016-01-21 ENCOUNTER — Encounter: Payer: Self-pay | Admitting: Internal Medicine

## 2016-02-26 ENCOUNTER — Other Ambulatory Visit: Payer: Self-pay | Admitting: Internal Medicine

## 2016-03-02 DIAGNOSIS — M65332 Trigger finger, left middle finger: Secondary | ICD-10-CM | POA: Insufficient documentation

## 2016-03-02 DIAGNOSIS — M180 Bilateral primary osteoarthritis of first carpometacarpal joints: Secondary | ICD-10-CM | POA: Diagnosis not present

## 2016-03-05 ENCOUNTER — Encounter: Payer: Self-pay | Admitting: Internal Medicine

## 2016-03-05 ENCOUNTER — Ambulatory Visit (INDEPENDENT_AMBULATORY_CARE_PROVIDER_SITE_OTHER): Payer: Medicare Other | Admitting: Internal Medicine

## 2016-03-05 VITALS — BP 122/80 | HR 74 | Temp 98.2°F | Resp 12 | Wt 192.8 lb

## 2016-03-05 DIAGNOSIS — E1159 Type 2 diabetes mellitus with other circulatory complications: Secondary | ICD-10-CM | POA: Diagnosis not present

## 2016-03-05 DIAGNOSIS — E1165 Type 2 diabetes mellitus with hyperglycemia: Secondary | ICD-10-CM

## 2016-03-05 MED ORDER — BASAGLAR KWIKPEN 100 UNIT/ML ~~LOC~~ SOPN: 22.0000 [IU] | PEN_INJECTOR | Freq: Every day | SUBCUTANEOUS | Status: DC

## 2016-03-05 MED ORDER — SITAGLIPTIN PHOSPHATE 100 MG PO TABS: 100.0000 mg | ORAL_TABLET | Freq: Every day | ORAL | Status: DC

## 2016-03-05 NOTE — Progress Notes (Signed)
Patient ID: David [name], male   DOB: 07-31-1951, 64 y.o.   MRN: [id number]   HPI: David [name] is a 65 y.o.-year-old male, returning for f/u for DM2, dx in ~2008, insulin-dependent since 09/2015, uncontrolled, with complications (CAD - s/p stents in 2013, PN). Last visit 5 mo ago  Last hemoglobin A1c was: Lab Results  Component Value Date   HGBA1C 10.1* 08/01/2015   HGBA1C 10.5* 05/16/2015   HGBA1C 11.2* 10/09/2014   Pt is on a regimen of: - Lantus 22 units at bedtime >> started 09/2015 - ran out beginning of 01/2016 - Metformin 1000 mg 2x a day, with meals >> no SEs - Glipizide 10 mg 2x a day - Januvia 100 mg in am - started 03/2015  Pt checks his sugars 2-3x a day and they were better before he ran out of insulin a little more than 1 mo ago and could not refill Lantus as it was too expensive (did not call to let me know!!!). He did not check sugars after he stopped Lantus. Before; - am: 180-220 >> 125, 148-275 - 2h after b'fast: n/c  - before lunch: n/c >> 153-226 - 2h after lunch: n/c - before dinner: n/c >> 86-240 - 2h after dinner: n/c - bedtime: n/c >> 162-286 - nighttime: n/c No lows. Lowest sugar was 154; ? hypoglycemia awareness Highest sugar was 240 >> 366.  Glucometer: One Touch  Pt's meals are: - Breakfast: protein shake - Lunch: n/a - Dinner: meat + veggies /potatoes or salad - Snacks: no No deserts. Diet sodas, ice tea with splenda.   - no CKD, last BUN/creatinine:  Lab Results  Component Value Date   BUN 14 05/16/2015   CREATININE 0.90 05/16/2015  He is on Losartan. - last set of lipids: Lab Results  Component Value Date   CHOL 132 05/16/2015   HDL 34.50* 05/16/2015   LDLCALC 81 05/16/2015   LDLDIRECT 103.9 11/05/2013   TRIG 84.0 05/16/2015   CHOLHDL 4 05/16/2015  He is on Atorvastatin. - last eye exam was in 08/2015. No DR.  - + numbness and tingling in his feet.  He also has HL, HTN, depression. He has a history of anabolic steroid use for  muscle building.  ROS: Constitutional: no weight gain/loss, no fatigue, no subjective hyperthermia/hypothermia Eyes: no blurry vision, no xerophthalmia ENT: no sore throat, no nodules palpated in throat, no dysphagia/odynophagia, no hoarseness Cardiovascular: no CP/SOB/palpitations/leg swelling Respiratory: no cough/SOB Gastrointestinal: no N/V/D/C Musculoskeletal: no muscle/joint aches Skin: no rashes Neurological: no tremors/numbness/tingling/dizziness  I reviewed pt's medications, allergies, PMH, social hx, family hx, and changes were documented in the history of present illness. Otherwise, unchanged from my initial visit note.   Past Medical History  Diagnosis Date  . Allergy   . Depression   . Diabetes mellitus   . Arthritis   . Sleep apnea     CPAP  . CAD (coronary artery disease)     s/p bifurcation stenting (DES x2) and stenting bifurcation lesion distal RCA (1 DES)  . Hyperlipemia   . Hypertension    Past Surgical History  Procedure Laterality Date  . Tonsillectomy  1959  . Coronary angioplasty with stent placement   March 2010    Dr. Olevia [name]   Social History   Social History  . Marital Status: Married    Spouse Name: N/A  . Number of Children: 2   Occupational History  . Selling storage buildings     site closed  . Retail data  collection    Social History Main Topics  . Smoking status: Former Smoker    Types: Cigarettes    Quit date: 11/30/1983  . Smokeless tobacco: Never Used  . Alcohol Use:  beer, 3-4 times a week, 6-8 drinks at the time       . Drug Use: No   Social History Narrative   No living will   Would want wife, then son David [name], as health care POA   Would accept resuscitation   Not sure about tube feeds   Current Outpatient Prescriptions on File Prior to Visit  Medication Sig Dispense Refill  . aspirin 81 MG tablet Take 81 mg by mouth daily.      . clopidogrel (PLAVIX) 75 MG tablet TAKE ONE TABLET BY MOUTH ONCE DAILY 90 tablet 3  .  glipiZIDE (GLUCOTROL) 10 MG tablet TAKE 1 TABLET BY MOUTH TWICE A DAY BEFORE A MEAL. 60 tablet 3  . glucose blood (ONE TOUCH ULTRA TEST) test strip Use to test blood sugar 2 times daily as instructed. Dx: E11.59, E11.65 100 each 5  . Insulin Glargine (LANTUS SOLOSTAR) 100 UNIT/ML Solostar Pen Inject 18 Units into the skin daily at 10 pm. 5 pen 1  . Insulin Pen Needle 32G X 4 MM MISC Use as advised, once a day 100 each 11  . losartan (COZAAR) 100 MG tablet TAKE ONE TABLET BY MOUTH ONCE DAILY 30 tablet 11  . metFORMIN (GLUCOPHAGE) 1000 MG tablet TAKE ONE TABLET BY MOUTH TWICE DAILY WITH MEALS 60 tablet 11  . metoprolol succinate (TOPROL-XL) 25 MG 24 hr tablet TAKE ONE TABLET BY MOUTH ONCE DAILY 30 tablet 6  . naproxen sodium (ALEVE) 220 MG tablet Take 220-440 mg by mouth 2 (two) times daily as needed (pain).     Glory Rosebush DELICA LANCETS FINE MISC Use to test blood sugar 2 times daily as instructed. Dx: E11.59, E11.65 100 each 5  . PARoxetine (PAXIL) 20 MG tablet TAKE TWO TABLETS BY MOUTH ONCE DAILY FOR DEPRESSION 60 tablet 11  . rosuvastatin (CRESTOR) 10 MG tablet Take 1 tablet (10 mg total) by mouth daily. 30 tablet 11  . sildenafil (REVATIO) 20 MG tablet Take 60-100 mg by mouth daily as needed (for ED).     [name] sitaGLIPtin (JANUVIA) 100 MG tablet Take 1 tablet (100 mg total) by mouth daily. 30 tablet 11  . triamcinolone cream (KENALOG) 0.1 % APPLY  CREAM EXTERNALLY TWICE DAILY 45 g 2   No current facility-administered medications on file prior to visit.   Allergies  Allergen Reactions  . Venlafaxine     Slowed his urine stream   Family History  Problem Relation Age of Onset  . Heart failure Father     Deceased 72 y/o  . Diabetes Father   . Cancer Father     Prostate  . Heart failure Mother     Deceased 9 y/o  . Schizophrenia Son     Paranoid  . Uveitis Son   . Cancer Brother     prostate  . Colon cancer Neg Hx   . Esophageal cancer Neg Hx   . Stomach cancer Neg Hx    PE: BP  122/80 mmHg  Pulse 74  Temp(Src) 98.2 F (36.8 C) (Oral)  Resp 12  Wt 192 lb 12.8 oz (87.454 kg)  SpO2 97% Body mass index is 27.66 kg/(m^2). Wt Readings from Last 3 Encounters:  03/05/16 192 lb 12.8 oz (87.454 kg)  12/09/15 191 lb (86.637 kg)  10/06/15 191 lb 6.4 oz (86.818 kg)   Constitutional: Slightly overweight, in NAD Eyes: PERRLA, EOMI, no exophthalmos ENT: moist mucous membranes, no thyromegaly, no cervical lymphadenopathy Cardiovascular: RRR, No MRG Respiratory: CTA B Gastrointestinal: abdomen soft, NT, ND, BS+ Musculoskeletal: no deformities, strength intact in all 4 Skin: moist, warm, no rashes Neurological: no tremor with outstretched hands, DTR normal in all 4  ASSESSMENT: 1. DM2, insulin-dependent, uncontrolled, with complications - CAD - s/p 4 stents 2013 - seeing Dr Burt Knack - PN  PLAN:  1. Patient with long-standing, uncontrolled diabetes, on oral antidiabetic regimen + added basal insulin at last visit >> sugars improved but he could not refill Lantus 1.5 mo ago as too expensive... He did not check sugars after stopping Lantus. - will start [place] - due to CBG variability >> will need to check a C peptide when sugars <180 - I suggested to:  Patient Instructions  Please start: - Basaglar 22 units at bedtime.  Continue: - Metformin 1000 mg 2x a day, with meals - Glipizide 10 mg 2x a day - Januvia 100 mg in am  Please return in 1-1.5 months with your sugar log.   - advised for yearly eye exams >> he is UTD - had flu shot this season - checked HbA1c today >> 9.1% (better but still high as he was off insulin for >72mo) - Return to clinic in 1.5 mo with sugar log

## 2016-03-05 NOTE — Patient Instructions (Signed)
Please start: - Basaglar 22 units at bedtime.  Continue: - Metformin 1000 mg 2x a day, with meals - Glipizide 10 mg 2x a day - Januvia 100 mg in am  Please return in 1-1.5 months with your sugar log.

## 2016-03-17 ENCOUNTER — Encounter: Payer: Self-pay | Admitting: Internal Medicine

## 2016-03-18 ENCOUNTER — Encounter: Payer: Self-pay | Admitting: *Deleted

## 2016-03-19 ENCOUNTER — Encounter: Payer: Self-pay | Admitting: *Deleted

## 2016-03-31 DIAGNOSIS — H40013 Open angle with borderline findings, low risk, bilateral: Secondary | ICD-10-CM | POA: Diagnosis not present

## 2016-03-31 DIAGNOSIS — E119 Type 2 diabetes mellitus without complications: Secondary | ICD-10-CM | POA: Diagnosis not present

## 2016-03-31 DIAGNOSIS — S0012XA Contusion of left eyelid and periocular area, initial encounter: Secondary | ICD-10-CM | POA: Diagnosis not present

## 2016-03-31 DIAGNOSIS — H35033 Hypertensive retinopathy, bilateral: Secondary | ICD-10-CM | POA: Diagnosis not present

## 2016-03-31 DIAGNOSIS — I10 Essential (primary) hypertension: Secondary | ICD-10-CM | POA: Diagnosis not present

## 2016-03-31 DIAGNOSIS — Z794 Long term (current) use of insulin: Secondary | ICD-10-CM | POA: Diagnosis not present

## 2016-03-31 LAB — HM DIABETES EYE EXAM

## 2016-04-04 ENCOUNTER — Encounter: Payer: Self-pay | Admitting: Internal Medicine

## 2016-04-07 ENCOUNTER — Encounter: Payer: Self-pay | Admitting: Internal Medicine

## 2016-04-09 ENCOUNTER — Other Ambulatory Visit (INDEPENDENT_AMBULATORY_CARE_PROVIDER_SITE_OTHER): Payer: Medicare Other | Admitting: *Deleted

## 2016-04-09 ENCOUNTER — Ambulatory Visit (INDEPENDENT_AMBULATORY_CARE_PROVIDER_SITE_OTHER): Payer: Medicare Other | Admitting: Internal Medicine

## 2016-04-09 ENCOUNTER — Encounter: Payer: Self-pay | Admitting: Internal Medicine

## 2016-04-09 VITALS — BP 122/84 | HR 73 | Temp 99.0°F | Resp 12 | Wt 192.0 lb

## 2016-04-09 DIAGNOSIS — E1165 Type 2 diabetes mellitus with hyperglycemia: Secondary | ICD-10-CM

## 2016-04-09 DIAGNOSIS — E1159 Type 2 diabetes mellitus with other circulatory complications: Secondary | ICD-10-CM

## 2016-04-09 LAB — POCT GLYCOSYLATED HEMOGLOBIN (HGB A1C): Hemoglobin A1C: 9.1

## 2016-04-09 NOTE — Progress Notes (Signed)
Patient ID: David [name], male   DOB: 30-Jan-1951, 65 y.o.   MRN: [id number]   HPI: David [name] is a 65 y.o.-year-old male, returning for f/u for DM2, dx in ~2008, insulin-dependent since 09/2015, uncontrolled, with complications (CAD - s/p stents in 2013, PN). Last visit 1.5 mo ago  Last hemoglobin A1c was: 03/05/2016: HbA1c 9.1% Lab Results  Component Value Date   HGBA1C 10.1* 08/01/2015   HGBA1C 10.5* 05/16/2015   HGBA1C 11.2* 10/09/2014   Pt is on a regimen of: - Lantus 22 units at bedtime >> started 09/2015 - ran out beginning of 01/2016 >> could not buy Basaglar 02/2016 -had a sample of Levemir >> ran out. He needs to spend his deductible first. - Metformin 1000 mg 2x a day, with meals >> no SEs - Glipizide 10 mg 2x a day - Januvia 100 mg in am - started 03/2015  Pt checks his sugars 2-3x a day before running out of insulin in last week: - am: 180-220 >> 125, 148-275 >> 139, 182-270 while on insulin  - 2h after b'fast: n/c  - before lunch: n/c >> 153-226 >> 147-250 - 2h after lunch: n/c - before dinner: n/c >> 86-240 >> 94, 138-225 - 2h after dinner: n/c - bedtime: n/c >> 162-286 >> 220-360 - nighttime: n/c No lows. Lowest sugar was 154 >> 94; ? hypoglycemia awareness Highest sugar was 240 >> 366 >> 360.  Glucometer: One Touch  Pt's meals are: - Breakfast: protein shake - Lunch: n/a - Dinner: meat + veggies /potatoes or salad - Snacks: no No deserts. Diet sodas, ice tea with splenda.   - no CKD, last BUN/creatinine:  Lab Results  Component Value Date   BUN 14 05/16/2015   CREATININE 0.90 05/16/2015  He is on Losartan. - last set of lipids: Lab Results  Component Value Date   CHOL 132 05/16/2015   HDL 34.50* 05/16/2015   LDLCALC 81 05/16/2015   LDLDIRECT 103.9 11/05/2013   TRIG 84.0 05/16/2015   CHOLHDL 4 05/16/2015  He is on Atorvastatin. - last eye exam was in 08/2015. No DR.  - + numbness and tingling in his feet.  He also has HL, HTN, depression. He  has a history of anabolic steroid use for muscle building.  ROS: Constitutional: no weight gain/loss, no fatigue, no subjective hyperthermia/hypothermia Eyes: no blurry vision, no xerophthalmia ENT: no sore throat, no nodules palpated in throat, no dysphagia/odynophagia, no hoarseness Cardiovascular: no CP/SOB/palpitations/leg swelling Respiratory: no cough/SOB Gastrointestinal: no N/V/D/C Musculoskeletal: no muscle/joint aches Skin: no rashes Neurological: no tremors/numbness/tingling/dizziness  I reviewed pt's medications, allergies, PMH, social hx, family hx, and changes were documented in the history of present illness. Otherwise, unchanged from my initial visit note.   Past Medical History  Diagnosis Date  . Allergy   . Depression   . Diabetes mellitus   . Arthritis   . Sleep apnea     CPAP  . CAD (coronary artery disease)     s/p bifurcation stenting (DES x2) and stenting bifurcation lesion distal RCA (1 DES)  . Hyperlipemia   . Hypertension    Past Surgical History  Procedure Laterality Date  . Tonsillectomy  1959  . Coronary angioplasty with stent placement   March 2010    Dr. Olevia Perches   Social History   Social History  . Marital Status: Married    Spouse Name: N/A  . Number of Children: 2   Occupational History  . Patent examiner buildings  site closed  . Retail data collection    Social History Main Topics  . Smoking status: Former Smoker    Types: Cigarettes    Quit date: 11/30/1983  . Smokeless tobacco: Never Used  . Alcohol Use:  beer, 3-4 times a week, 6-8 drinks at the time       . Drug Use: No   Social History Narrative   No living will   Would want wife, then son [name], as health care POA   Would accept resuscitation   Not sure about tube feeds   Current Outpatient Prescriptions on File Prior to Visit  Medication Sig Dispense Refill  . aspirin 81 MG tablet Take 81 mg by mouth daily.      . clopidogrel (PLAVIX) 75 MG tablet TAKE ONE  TABLET BY MOUTH ONCE DAILY 90 tablet 3  . glipiZIDE (GLUCOTROL) 10 MG tablet TAKE 1 TABLET BY MOUTH TWICE A DAY BEFORE A MEAL. 60 tablet 3  . glucose blood (ONE TOUCH ULTRA TEST) test strip Use to test blood sugar 2 times daily as instructed. Dx: E11.59, E11.65 100 each 5  . Insulin Glargine (BASAGLAR KWIKPEN) 100 UNIT/ML SOPN Inject 0.22 mLs (22 Units total) into the skin at bedtime. 5 pen 2  . Insulin Pen Needle 32G X 4 MM MISC Use as advised, once a day 100 each 11  . losartan (COZAAR) 100 MG tablet TAKE ONE TABLET BY MOUTH ONCE DAILY 30 tablet 11  . metFORMIN (GLUCOPHAGE) 1000 MG tablet TAKE ONE TABLET BY MOUTH TWICE DAILY WITH MEALS 60 tablet 11  . metoprolol succinate (TOPROL-XL) 25 MG 24 hr tablet TAKE ONE TABLET BY MOUTH ONCE DAILY 30 tablet 6  . naproxen sodium (ALEVE) 220 MG tablet Take 220-440 mg by mouth 2 (two) times daily as needed (pain).     Glory Rosebush DELICA LANCETS FINE MISC Use to test blood sugar 2 times daily as instructed. Dx: E11.59, E11.65 100 each 5  . PARoxetine (PAXIL) 20 MG tablet TAKE TWO TABLETS BY MOUTH ONCE DAILY FOR DEPRESSION 60 tablet 11  . rosuvastatin (CRESTOR) 10 MG tablet Take 1 tablet (10 mg total) by mouth daily. 30 tablet 11  . sildenafil (REVATIO) 20 MG tablet Take 60-100 mg by mouth daily as needed (for ED).     [name] sitaGLIPtin (JANUVIA) 100 MG tablet Take 1 tablet (100 mg total) by mouth daily. 30 tablet 5  . triamcinolone cream (KENALOG) 0.1 % APPLY  CREAM EXTERNALLY TWICE DAILY 45 g 2   No current facility-administered medications on file prior to visit.   Allergies  Allergen Reactions  . Venlafaxine     Slowed his urine stream   Family History  Problem Relation Age of Onset  . Heart failure Father     Deceased 38 y/o  . Diabetes Father   . Cancer Father     Prostate  . Heart failure Mother     Deceased 56 y/o  . Schizophrenia Son     Paranoid  . Uveitis Son   . Cancer Brother     prostate  . Colon cancer Neg Hx   . Esophageal cancer  Neg Hx   . Stomach cancer Neg Hx    PE: BP 122/84 mmHg  Pulse 73  Temp(Src) 99 F (37.2 C) (Oral)  Resp 12  Wt 192 lb (87.091 kg)  SpO2 97% Body mass index is 27.55 kg/(m^2). Wt Readings from Last 3 Encounters:  04/09/16 192 lb (87.091 kg)  03/05/16 192 lb  12.8 oz (87.454 kg)  12/09/15 191 lb (86.637 kg)   Constitutional: Slightly overweight, in NAD; + black L eye and frontal hematoma >> s/p fall Eyes: PERRLA, EOMI, no exophthalmos ENT: moist mucous membranes, no thyromegaly, no cervical lymphadenopathy Cardiovascular: RRR, No MRG Respiratory: CTA B Gastrointestinal: abdomen soft, NT, ND, BS+ Musculoskeletal: no deformities, strength intact in all 4 Skin: moist, warm, no rashes Neurological: no tremor with outstretched hands, DTR normal in all 4  ASSESSMENT: 1. DM2, insulin-dependent, uncontrolled, with complications - CAD - s/p 4 stents 2013 - seeing Dr Burt Knack - PN  PLAN:  1. Patient with long-standing, uncontrolled diabetes, on oral antidiabetic regimen + added basal insulin at last visit >> sugars improved but he could not continue 2/2 price. He is close to meeting his deductible >> will get Basaglar. He refuses NPH insulin. - due to CBG variability >> will need to check a C peptide when sugars <180 - I suggested to:  Patient Instructions  Please restart Basaglar 22 units at bedtime. Increase the dose by 4 units every 4 days until you reach 30 units.   Continue: - Metformin 1000 mg 2x a day, with meals - Glipizide 10 mg 2x a day before meals - Januvia 100 mg in am before breakfast  Please return in 1.5 months with your sugar log.    - advised for yearly eye exams >> he is UTD - had flu shot this season - will check CMP and Lipids at next visit - Return to clinic in 1.5 mo with sugar log

## 2016-04-09 NOTE — Patient Instructions (Signed)
Please restart Basaglar 22 units at bedtime. Increase the dose by 4 units every 4 days until you reach 30 units.   Continue: - Metformin 1000 mg 2x a day, with meals - Glipizide 10 mg 2x a day before meals - Januvia 100 mg in am before breakfast  Please return in 1.5 months with your sugar log.

## 2016-04-13 DIAGNOSIS — M180 Bilateral primary osteoarthritis of first carpometacarpal joints: Secondary | ICD-10-CM | POA: Diagnosis not present

## 2016-04-13 DIAGNOSIS — M25522 Pain in left elbow: Secondary | ICD-10-CM | POA: Diagnosis not present

## 2016-04-24 ENCOUNTER — Other Ambulatory Visit: Payer: Self-pay | Admitting: Internal Medicine

## 2016-05-05 ENCOUNTER — Encounter: Payer: Self-pay | Admitting: Internal Medicine

## 2016-06-05 ENCOUNTER — Other Ambulatory Visit: Payer: Self-pay | Admitting: Internal Medicine

## 2016-06-09 ENCOUNTER — Encounter: Payer: Self-pay | Admitting: Internal Medicine

## 2016-06-09 ENCOUNTER — Ambulatory Visit (INDEPENDENT_AMBULATORY_CARE_PROVIDER_SITE_OTHER): Payer: Medicare Other | Admitting: Internal Medicine

## 2016-06-09 VITALS — BP 142/92 | HR 76 | Ht 69.0 in | Wt 195.0 lb

## 2016-06-09 DIAGNOSIS — E1165 Type 2 diabetes mellitus with hyperglycemia: Secondary | ICD-10-CM

## 2016-06-09 DIAGNOSIS — E1159 Type 2 diabetes mellitus with other circulatory complications: Secondary | ICD-10-CM

## 2016-06-09 LAB — LIPID PANEL
CHOLESTEROL: 145 mg/dL (ref 0–200)
HDL: 46.4 mg/dL (ref >39.00)
LDL CALC: 75 mg/dL (ref 0–99)
NonHDL: 99.02
TRIGLYCERIDES: 118 mg/dL (ref 0.0–149.0)
Total CHOL/HDL Ratio: 3
VLDL: 23.6 mg/dL (ref 0.0–40.0)

## 2016-06-09 MED ORDER — EXENATIDE ER 2 MG ~~LOC~~ PEN: PEN_INJECTOR | SUBCUTANEOUS | Status: DC

## 2016-06-09 MED ORDER — BASAGLAR KWIKPEN 100 UNIT/ML ~~LOC~~ SOPN: 34.0000 [IU] | PEN_INJECTOR | Freq: Every day | SUBCUTANEOUS | Status: DC

## 2016-06-09 NOTE — Progress Notes (Signed)
Patient ID: Langley Krom, male   DOB: 30-Sep-1951, 65 y.o.   MRN: [id number]   HPI: Dahir Barbati is a 65 y.o.-year-old male, returning for f/u for DM2, dx in ~2008, insulin-dependent since 09/2015, uncontrolled, with complications (CAD - s/p stents in 2013, PN). Last visit 67 mo ago  Last hemoglobin A1c was: 03/05/2016: HbA1c 9.1% Lab Results  Component Value Date   HGBA1C 9.1 03/05/2016   HGBA1C 10.1* 08/01/2015   HGBA1C 10.5* 05/16/2015   Pt is on a regimen of: - Lantus 22 units at bedtime >> started 09/2015 - ran out beginning of 01/2016 >> 02/2016 started Basaglar 30 units at bedtime - Metformin 1000 mg 2x a day, with meals >> no SEs - Glipizide 10 mg 2x a day - Januvia 100 mg in am - started 03/2015 >> ran out 3 weeks ago  Pt checks his sugars 2-3x a day: - am: 180-220 >> 125, 148-275 >> 139, 182-270 while on insulin >> 129, 143-226 - 2h after b'fast: n/c  - before lunch: n/c >> 153-226 >> 147-250 >> 127, 149-198 - 2h after lunch: n/c - before dinner: n/c >> 86-240 >> 94, 138-225 >> 72, 144-227 - 2h after dinner: n/c - bedtime: n/c >> 162-286 >> 220-360 >> 80, 187-287, 310 - nighttime: n/c No lows. Lowest sugar was 154 >> 94 >> 72; ? hypoglycemia awareness Highest sugar was 240 >> 366 >> 360 >> 310.  Glucometer: One Touch  Pt's meals are: - Breakfast: protein shake - Lunch: n/a - Dinner: meat + veggies /potatoes or salad - Snacks: no No deserts. Diet sodas, ice tea with splenda.   - no CKD, last BUN/creatinine:  Lab Results  Component Value Date   BUN 14 05/16/2015   CREATININE 0.90 05/16/2015  He is on Losartan. - last set of lipids: Lab Results  Component Value Date   CHOL 132 05/16/2015   HDL 34.50* 05/16/2015   LDLCALC 81 05/16/2015   LDLDIRECT 103.9 11/05/2013   TRIG 84.0 05/16/2015   CHOLHDL 4 05/16/2015  He is on Atorvastatin. - last eye exam was in 08/2015. No DR.  - + numbness and tingling in his feet.  He also has HL, HTN, depression. He has a  history of anabolic steroid use for muscle building.  ROS: Constitutional: no weight gain/loss, no fatigue, no subjective hyperthermia/hypothermia Eyes: no blurry vision, no xerophthalmia ENT: no sore throat, no nodules palpated in throat, no dysphagia/odynophagia, no hoarseness Cardiovascular: no CP/SOB/palpitations/leg swelling Respiratory: no cough/SOB Gastrointestinal: no N/V/D/C Musculoskeletal: no muscle/joint aches Skin: no rashes Neurological: no tremors/numbness/tingling/dizziness  I reviewed pt's medications, allergies, PMH, social hx, family hx, and changes were documented in the history of present illness. Otherwise, unchanged from my initial visit note.   Past Medical History  Diagnosis Date  . Allergy   . Depression   . Diabetes mellitus   . Arthritis   . Sleep apnea     CPAP  . CAD (coronary artery disease)     s/p bifurcation stenting (DES x2) and stenting bifurcation lesion distal RCA (1 DES)  . Hyperlipemia   . Hypertension    Past Surgical History  Procedure Laterality Date  . Tonsillectomy  1959  . Coronary angioplasty with stent placement   March 2010    Dr. Olevia Perches   Social History   Social History  . Marital Status: Married    Spouse Name: N/A  . Number of Children: 2   Occupational History  . Patent examiner buildings  site closed  . Retail data collection    Social History Main Topics  . Smoking status: Former Smoker    Types: Cigarettes    Quit date: 11/30/1983  . Smokeless tobacco: Never Used  . Alcohol Use:  beer, 3-4 times a week, 6-8 drinks at the time       . Drug Use: No   Social History Narrative   No living will   Would want wife, then son [name], as health care POA   Would accept resuscitation   Not sure about tube feeds   Current Outpatient Prescriptions on File Prior to Visit  Medication Sig Dispense Refill  . aspirin 81 MG tablet Take 81 mg by mouth daily.      . clopidogrel (PLAVIX) 75 MG tablet TAKE ONE TABLET BY  MOUTH ONCE DAILY 90 tablet 3  . glipiZIDE (GLUCOTROL) 10 MG tablet TAKE 1 TABLET BY MOUTH TWICE A DAY BEFORE A MEAL. 60 tablet 3  . glucose blood (ONE TOUCH ULTRA TEST) test strip Use to test blood sugar 2 times daily as instructed. Dx: E11.59, E11.65 100 each 5  . Insulin Glargine (BASAGLAR KWIKPEN) 100 UNIT/ML SOPN Inject 0.22 mLs (22 Units total) into the skin at bedtime. 5 pen 2  . Insulin Pen Needle 32G X 4 MM MISC Use as advised, once a day 100 each 11  . losartan (COZAAR) 100 MG tablet TAKE ONE TABLET BY MOUTH ONCE DAILY 30 tablet 11  . metFORMIN (GLUCOPHAGE) 1000 MG tablet TAKE ONE TABLET BY MOUTH TWICE DAILY WITH MEALS 60 tablet 11  . metoprolol succinate (TOPROL-XL) 25 MG 24 hr tablet TAKE ONE TABLET BY MOUTH ONCE DAILY 30 tablet 6  . naproxen sodium (ALEVE) 220 MG tablet Take 220-440 mg by mouth 2 (two) times daily as needed (pain).     Glory Rosebush DELICA LANCETS FINE MISC Use to test blood sugar 2 times daily as instructed. Dx: E11.59, E11.65 100 each 5  . PARoxetine (PAXIL) 20 MG tablet TAKE TWO TABLETS BY MOUTH ONCE DAILY FOR DEPRESSION 60 tablet 11  . rosuvastatin (CRESTOR) 10 MG tablet Take 1 tablet (10 mg total) by mouth daily. 30 tablet 11  . sildenafil (REVATIO) 20 MG tablet Take 60-100 mg by mouth daily as needed (for ED).     [name] sitaGLIPtin (JANUVIA) 100 MG tablet Take 1 tablet (100 mg total) by mouth daily. 30 tablet 5  . triamcinolone cream (KENALOG) 0.1 % APPLY TO THE AFFECTED AREA TWICE DAILY. 45 g 1   No current facility-administered medications on file prior to visit.   Allergies  Allergen Reactions  . Venlafaxine     Slowed his urine stream   Family History  Problem Relation Age of Onset  . Heart failure Father     Deceased 42 y/o  . Diabetes Father   . Cancer Father     Prostate  . Heart failure Mother     Deceased 61 y/o  . Schizophrenia Son     Paranoid  . Uveitis Son   . Cancer Brother     prostate  . Colon cancer Neg Hx   . Esophageal cancer Neg Hx    . Stomach cancer Neg Hx    PE: BP 142/92 mmHg  Pulse 76  Ht 5\' 9"  (1.753 m)  Wt 195 lb (88.451 kg)  BMI 28.78 kg/m2  SpO2 93% Body mass index is 28.78 kg/(m^2). Wt Readings from Last 3 Encounters:  06/09/16 195 lb (88.451 kg)  04/09/16 192  lb (87.091 kg)  03/05/16 192 lb 12.8 oz (87.454 kg)   Constitutional: Slightly overweight, in NAD Eyes: PERRLA, EOMI, no exophthalmos ENT: moist mucous membranes, no thyromegaly, no cervical lymphadenopathy Cardiovascular: RRR, No MRG Respiratory: CTA B Gastrointestinal: abdomen soft, NT, ND, BS+ Musculoskeletal: no deformities, strength intact in all 4 Skin: moist, warm, no rashes Neurological: no tremor with outstretched hands, DTR normal in all 4  ASSESSMENT: 1. DM2, insulin-dependent, uncontrolled, with complications - CAD - s/p 4 stents 2013 - seeing Dr Burt Knack - PN  PLAN:  1. Patient with long-standing, uncontrolled diabetes, on oral antidiabetic regimen + restarted basal insulin at last visit >> sugars still very fluctuating. He frequently skips meals (still takes Glipizide) and drinks beer >> sugars may be high before next meal.  - will increase basaglar and try to add a GLP1 R agonist. If Bydureon not covered, may try Soliqua (given coupon) - due to CBG variability >> will need to check a C peptide and I will add anti-pancreatic antibodies - I suggested to:  Patient Instructions  Please continue: - Metformin 1000 mg 2x a day, with meals - Glipizide 10 mg 2x a day before meals  Please increase Basaglar to 34 units at bedtime.  Please start Bydureon 2 mg weekly.  Please return in 1.5 months with your sugar log.   Please stop at the lab.   - advised for yearly eye exams >> he is UTD - will check CMP and Lipids now - Return to clinic in 1.5 mo with sugar log   Orders Placed This Encounter  Procedures  . COMPLETE METABOLIC PANEL WITH GFR  . Lipid panel  . C-peptide  . Glucose, Fasting  . Glutamic acid decarboxylase  auto abs  . Anti-islet cell antibody   Component     Latest Ref Rng 06/09/2016  C-Peptide     0.80-3.85 ng/mL 1.78  Glucose, Fasting     65 - 99 mg/dL 184 (H)  Glutamic Acid Decarb Ab     <5 IU/mL <5  Pancreatic Islet Cell Antibody     < 5 JDF Units <5  Slight insulin deficiency, but no clear type 1 diabetes.  Component     Latest Ref Rng 06/09/2016  Sodium     135 - 146 mmol/L 140  Potassium     3.5 - 5.3 mmol/L 4.5  Chloride     98 - 110 mmol/L 104  CO2     20 - 31 mmol/L 22  Glucose     65 - 99 mg/dL 190 (H)  BUN     7 - 25 mg/dL 13  Creatinine     0.70 - 1.25 mg/dL 0.97  Total Bilirubin     0.2 - 1.2 mg/dL 0.4  Alkaline Phosphatase     40 - 115 U/L 79  AST     10 - 35 U/L 84 (H)  ALT     9 - 46 U/L 74 (H)  Total Protein     6.1 - 8.1 g/dL 6.7  Albumin     3.6 - 5.1 g/dL 4.0  Calcium     8.6 - 10.3 mg/dL 9.6  GFR, Est African American     >=60 mL/min >89  GFR, Est Non African American     >=60 mL/min 80  Elevated LFTs, slightly better than a year ago.  Component     Latest Ref Rng 06/09/2016  Cholesterol     0 - 200 mg/dL 145  Triglycerides  0.0 - 149.0 mg/dL 118.0  HDL Cholesterol     >39.00 mg/dL 46.40  VLDL     0.0 - 40.0 mg/dL 23.6  LDL (calc)     0 - 99 mg/dL 75  Total CHOL/HDL Ratio      3  NonHDL      99.02  Great cholesterol levels!  Philemon Kingdom, MD PhD [facility]

## 2016-06-09 NOTE — Patient Instructions (Addendum)
Please continue: - Metformin 1000 mg 2x a day, with meals - Glipizide 10 mg 2x a day before meals  Please increase Basaglar to 34 units at bedtime.  Please start Bydureon 2 mg weekly.  Please return in 1.5 months with your sugar log.   Please stop at the lab.

## 2016-06-10 LAB — COMPLETE METABOLIC PANEL WITH GFR
ALT: 74 U/L — AB (ref 9–46)
AST: 84 U/L — AB (ref 10–35)
Albumin: 4 g/dL (ref 3.6–5.1)
Alkaline Phosphatase: 79 U/L (ref 40–115)
BUN: 13 mg/dL (ref 7–25)
CHLORIDE: 104 mmol/L (ref 98–110)
CO2: 22 mmol/L (ref 20–31)
CREATININE: 0.97 mg/dL (ref 0.70–1.25)
Calcium: 9.6 mg/dL (ref 8.6–10.3)
GFR, Est African American: >89 mL/min (ref >=60)
GFR, Est Non African American: 80 mL/min (ref >=60)
Glucose, Bld: 190 mg/dL — ABNORMAL HIGH (ref 65–99)
Potassium: 4.5 mmol/L (ref 3.5–5.3)
SODIUM: 140 mmol/L (ref 135–146)
Total Bilirubin: 0.4 mg/dL (ref 0.2–1.2)
Total Protein: 6.7 g/dL (ref 6.1–8.1)

## 2016-06-10 LAB — GLUCOSE, FASTING: Glucose, Fasting: 184 mg/dL — ABNORMAL HIGH (ref 65–99)

## 2016-06-10 LAB — C-PEPTIDE: C-Peptide: 1.78 ng/mL (ref 0.80–3.85)

## 2016-06-10 LAB — GLUTAMIC ACID DECARBOXYLASE AUTO ABS: Glutamic Acid Decarb Ab: <5 IU/mL (ref <5)

## 2016-06-14 LAB — ANTI-ISLET CELL ANTIBODY

## 2016-06-15 ENCOUNTER — Encounter: Payer: Self-pay | Admitting: Internal Medicine

## 2016-07-13 ENCOUNTER — Ambulatory Visit (INDEPENDENT_AMBULATORY_CARE_PROVIDER_SITE_OTHER): Payer: Medicare Other | Admitting: Internal Medicine

## 2016-07-13 ENCOUNTER — Encounter: Payer: Self-pay | Admitting: Internal Medicine

## 2016-07-13 VITALS — BP 130/80 | HR 68 | Temp 98.8°F | Ht 69.0 in | Wt 196.5 lb

## 2016-07-13 DIAGNOSIS — E1165 Type 2 diabetes mellitus with hyperglycemia: Secondary | ICD-10-CM

## 2016-07-13 DIAGNOSIS — Z0000 Encounter for general adult medical examination without abnormal findings: Secondary | ICD-10-CM

## 2016-07-13 DIAGNOSIS — Z7189 Other specified counseling: Secondary | ICD-10-CM

## 2016-07-13 DIAGNOSIS — N400 Enlarged prostate without lower urinary tract symptoms: Secondary | ICD-10-CM

## 2016-07-13 DIAGNOSIS — R7989 Other specified abnormal findings of blood chemistry: Secondary | ICD-10-CM | POA: Insufficient documentation

## 2016-07-13 DIAGNOSIS — E1159 Type 2 diabetes mellitus with other circulatory complications: Secondary | ICD-10-CM | POA: Diagnosis not present

## 2016-07-13 DIAGNOSIS — F330 Major depressive disorder, recurrent, mild: Secondary | ICD-10-CM | POA: Diagnosis not present

## 2016-07-13 DIAGNOSIS — D696 Thrombocytopenia, unspecified: Secondary | ICD-10-CM | POA: Diagnosis not present

## 2016-07-13 DIAGNOSIS — Z209 Contact with and (suspected) exposure to unspecified communicable disease: Secondary | ICD-10-CM | POA: Diagnosis not present

## 2016-07-13 DIAGNOSIS — R945 Abnormal results of liver function studies: Secondary | ICD-10-CM

## 2016-07-13 LAB — CBC WITH DIFFERENTIAL/PLATELET
BASOS ABS: 0 10*3/uL (ref 0.0–0.1)
BASOS PCT: 0.3 % (ref 0.0–3.0)
EOS ABS: 0.3 10*3/uL (ref 0.0–0.7)
Eosinophils Relative: 5.4 % — ABNORMAL HIGH (ref 0.0–5.0)
HCT: 37.4 % — ABNORMAL LOW (ref 39.0–52.0)
Hemoglobin: 12.6 g/dL — ABNORMAL LOW (ref 13.0–17.0)
Lymphocytes Relative: 25.4 % (ref 12.0–46.0)
Lymphs Abs: 1.6 10*3/uL (ref 0.7–4.0)
MCHC: 33.7 g/dL (ref 30.0–36.0)
MCV: 100.3 fl — ABNORMAL HIGH (ref 78.0–100.0)
MONO ABS: 0.7 10*3/uL (ref 0.1–1.0)
Monocytes Relative: 11.3 % (ref 3.0–12.0)
NEUTROS ABS: 3.5 10*3/uL (ref 1.4–7.7)
NEUTROS PCT: 57.6 % (ref 43.0–77.0)
PLATELETS: 114 10*3/uL — AB (ref 150.0–400.0)
RBC: 3.73 Mil/uL — ABNORMAL LOW (ref 4.22–5.81)
RDW: 15 % (ref 11.5–15.5)
WBC: 6.2 10*3/uL (ref 4.0–10.5)

## 2016-07-13 LAB — HEPATIC FUNCTION PANEL
ALK PHOS: 66 U/L (ref 39–117)
ALT: 58 U/L — ABNORMAL HIGH (ref 0–53)
AST: 59 U/L — AB (ref 0–37)
Albumin: 4.5 g/dL (ref 3.5–5.2)
BILIRUBIN DIRECT: 0.1 mg/dL (ref 0.0–0.3)
TOTAL PROTEIN: 7.3 g/dL (ref 6.0–8.3)
Total Bilirubin: 0.4 mg/dL (ref 0.2–1.2)

## 2016-07-13 LAB — PSA: PSA: 1.56 ng/mL (ref 0.10–4.00)

## 2016-07-13 NOTE — Assessment & Plan Note (Signed)
Ongoing symptoms but stable Will continue med Asked him to decrease alcohol--partially for his mood

## 2016-07-13 NOTE — Progress Notes (Signed)
Subjective:    Patient ID: David [name], male    DOB: 15-Dec-1950, 65 y.o.   MRN: [id number]  HPI Here for Medicare wellness and follow up of chronic health conditions Reviewed form and advanced directives Reviewed other doctors--Dr Cruzita Lederer and Dr Rick Duff (opto) Drinks 3-4 per day--- he does feel it helps him not "dwell on things" Does have some times when he drinks more (may have 6-8 on weekends in summer) Avoids driving. No black outs  No tobacco Has fallen several times--leg bruises.  Vision is okay Mild hearing loss-- not bad enough to consider aide yet No exercise--but on his feet all day Independent with instrumental ADLs Has noticed some problems with memory-- his worries financially will affect his memory  He has been having "spots" on his arms Not bruises Only on arms Do eventually clear up He shows multiple pictures and all seem ecchymotic Reviewed his low platelets in past No blood in urine or stool.  No bleeding when brushing teeth  Reviewed liver tests  Were elevated last year about the same--but not 2 years ago He does awaken with leg pain still --despite the change to rosuvastatin  Diabetes control is improving--though still not well controlled Continues to work with Dr Cruzita Lederer  No chest pain No SOB Will occasionally note a racing in heart-- at night if he awakens for pain. Not often No dizziness or syncope No edema  Chronic depression Some degree of anhedonia Still working every day but only 30 hours per week--this is tough for him Still with stress financially and worries with son No suicidal ideation, etc  Current Outpatient Prescriptions on File Prior to Visit  Medication Sig Dispense Refill  . aspirin 81 MG tablet Take 81 mg by mouth daily.      . clopidogrel (PLAVIX) 75 MG tablet TAKE ONE TABLET BY MOUTH ONCE DAILY 90 tablet 3  . Exenatide ER (BYDUREON) 2 MG PEN Inject 2 mg weekly under skin 4 each 2  . glipiZIDE (GLUCOTROL) 10 MG tablet  TAKE 1 TABLET BY MOUTH TWICE A DAY BEFORE A MEAL. 60 tablet 3  . glucose blood (ONE TOUCH ULTRA TEST) test strip Use to test blood sugar 2 times daily as instructed. Dx: E11.59, E11.65 100 each 5  . Insulin Glargine (BASAGLAR KWIKPEN) 100 UNIT/ML SOPN Inject 0.34 mLs (34 Units total) into the skin at bedtime. 5 pen 2  . Insulin Pen Needle 32G X 4 MM MISC Use as advised, once a day 100 each 11  . losartan (COZAAR) 100 MG tablet TAKE ONE TABLET BY MOUTH ONCE DAILY 30 tablet 11  . metFORMIN (GLUCOPHAGE) 1000 MG tablet TAKE ONE TABLET BY MOUTH TWICE DAILY WITH MEALS 60 tablet 11  . metoprolol succinate (TOPROL-XL) 25 MG 24 hr tablet TAKE ONE TABLET BY MOUTH ONCE DAILY 30 tablet 6  . naproxen sodium (ALEVE) 220 MG tablet Take 220-440 mg by mouth 2 (two) times daily as needed (pain).     Glory Rosebush DELICA LANCETS FINE MISC Use to test blood sugar 2 times daily as instructed. Dx: E11.59, E11.65 100 each 5  . PARoxetine (PAXIL) 20 MG tablet TAKE TWO TABLETS BY MOUTH ONCE DAILY FOR DEPRESSION 60 tablet 11  . rosuvastatin (CRESTOR) 10 MG tablet Take 1 tablet (10 mg total) by mouth daily. 30 tablet 11  . sildenafil (REVATIO) 20 MG tablet Take 60-100 mg by mouth daily as needed (for ED).     [name] triamcinolone cream (KENALOG) 0.1 % APPLY TO THE AFFECTED  AREA TWICE DAILY. 45 g 1   No current facility-administered medications on file prior to visit.     Allergies  Allergen Reactions  . Venlafaxine     Slowed his urine stream    Past Medical History:  Diagnosis Date  . Allergy   . Arthritis   . CAD (coronary artery disease)    s/p bifurcation stenting (DES x2) and stenting bifurcation lesion distal RCA (1 DES)  . Depression   . Diabetes mellitus   . Hyperlipemia   . Hypertension   . Sleep apnea    CPAP    Past Surgical History:  Procedure Laterality Date  . CORONARY ANGIOPLASTY WITH STENT PLACEMENT   March 2010   Dr. Olevia Perches  . TONSILLECTOMY  1959    Family History  Problem Relation Age of  Onset  . Heart failure Father     Deceased 44 y/o  . Diabetes Father   . Cancer Father     Prostate  . Heart failure Mother     Deceased 11 y/o  . Alcohol abuse Mother   . Schizophrenia Son     Paranoid  . Uveitis Son   . Cancer Brother     prostate  . Colon cancer Neg Hx   . Esophageal cancer Neg Hx   . Stomach cancer Neg Hx     Social History   Social History  . Marital status: Married    Spouse name: N/A  . Number of children: N/A  . Years of education: N/A   Occupational History  . 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 Smoking status: Former Smoker    Types: Cigarettes    Quit date: 11/30/1983  . Smokeless tobacco: Never Used  . Alcohol use 2.5 oz/week    5 Standard drinks or equivalent per week  . Drug use: No  . Sexual activity: Not on file   Other Topics Concern  . Not on file   Social History Narrative   No living will   Would want wife, then son [name], as health care POA   Would accept resuscitation   Not sure about tube feeds   Review of Systems Appetite is okay Weight stable Generally sleeps okay--other than the leg pain at times Wears seat belt Teeth okay-- keeps up with dentist Jone Baseman) Has pain in both CMC joints, left 3rd trigger finger. Some ongoing hip pain No rash or suspicious skin lesions Bowels are okay Voids okay--- slower and some dribbling    Objective:   Physical Exam  Constitutional: He is oriented to person, place, and time. He appears well-developed and well-nourished. No distress.  HENT:  Mouth/Throat: Oropharynx is clear and moist. No oropharyngeal exudate.  Neck: Normal range of motion. Neck supple. No thyromegaly present.  Cardiovascular: Normal rate, regular rhythm, normal heart sounds and intact distal pulses.  Exam reveals no gallop.   No murmur heard. Pulmonary/Chest: Effort normal and breath sounds normal. No respiratory distress. He has no wheezes. He  has no rales.  Abdominal: Soft. There is no tenderness.  Lymphadenopathy:    He has no cervical adenopathy.  Neurological: He is alert and oriented to person, place, and time.  President -- "Daisy Floro, Abbe [place]" [id number] Recall 3/3  Decreased sensation in feet  Skin:  Multiple ecchymotic areas No foot lesions or ulcers  Psychiatric:  Mood is neutral and appropriate affect Normal speech/dress  No thought process disturbance          Assessment & Plan:

## 2016-07-13 NOTE — Assessment & Plan Note (Signed)
I have personally reviewed the Medicare Annual Wellness questionnaire and have noted  1. The patient's medical and social history  2. Their use of alcohol, tobacco or illicit drugs  3. Their current medications and supplements  4. The patient's functional ability including ADL's, fall risks, home safety risks and hearing or visual              impairment.  5. Diet and physical activities  6. Evidence for depression or mood disorders  The patients weight, height, BMI and visual acuity have been recorded in the chart  I have made referrals, counseling and provided education to the patient based review of the above and I have provided the pt with a written personalized care plan for preventive services.   I have provided you with a copy of your personalized plan for preventive services. Please take the time to review along with your updated medication list.  Will check PSA after discussion. Has some mild BPH symptoms Colon due 2017 Yearly flu vaccine Discussed that he needs to reduce the alcohol and regular exercise

## 2016-07-13 NOTE — Assessment & Plan Note (Signed)
Does have easy bruising Will recheck hepatic function also

## 2016-07-13 NOTE — Assessment & Plan Note (Signed)
Continues with Dr Cruzita Lederer

## 2016-07-13 NOTE — Assessment & Plan Note (Signed)
See social history Blank forms done 

## 2016-07-13 NOTE — Patient Instructions (Signed)
Please try the rosuvastatin every other day--to see if that reduces the muscle aching. If you have significant symptoms, you can stop it.

## 2016-07-13 NOTE — Progress Notes (Signed)
Pre visit review using our clinic review tool, if applicable. No additional management support is needed unless otherwise documented below in the visit note. 

## 2016-07-13 NOTE — Assessment & Plan Note (Signed)
May be related to alcohol or the statin He will cut back to every other day with statin Urged him to cut back on alcohol Will recheck labs and hepatitis profile

## 2016-07-14 LAB — HEPATITIS PANEL, ACUTE
HCV Ab: NEGATIVE
Hep A IgM: NONREACTIVE
Hep B C IgM: NONREACTIVE
Hepatitis B Surface Ag: NEGATIVE

## 2016-07-15 ENCOUNTER — Other Ambulatory Visit: Payer: Self-pay | Admitting: Cardiovascular Disease

## 2016-07-21 ENCOUNTER — Other Ambulatory Visit: Payer: Self-pay | Admitting: Internal Medicine

## 2016-07-27 ENCOUNTER — Ambulatory Visit (INDEPENDENT_AMBULATORY_CARE_PROVIDER_SITE_OTHER): Payer: Medicare Other | Admitting: Internal Medicine

## 2016-07-27 ENCOUNTER — Encounter: Payer: Self-pay | Admitting: Internal Medicine

## 2016-07-27 VITALS — BP 140/78 | HR 70 | Ht 70.0 in | Wt 195.0 lb

## 2016-07-27 DIAGNOSIS — E1165 Type 2 diabetes mellitus with hyperglycemia: Secondary | ICD-10-CM

## 2016-07-27 DIAGNOSIS — E1159 Type 2 diabetes mellitus with other circulatory complications: Secondary | ICD-10-CM

## 2016-07-27 LAB — POCT GLYCOSYLATED HEMOGLOBIN (HGB A1C): Hemoglobin A1C: 6.3

## 2016-07-27 MED ORDER — INSULIN GLARGINE 100 UNIT/ML SOLOSTAR PEN: 34.0000 [IU] | PEN_INJECTOR | Freq: Every day | SUBCUTANEOUS | 5 refills | Status: DC

## 2016-07-27 NOTE — Patient Instructions (Signed)
Please continue: - Metformin 1000 mg 2x a day, with meals - Glipizide 10 mg 2x a day before meals - Basaglar 34 units at bedtime. - Bydureon 2 mg weekly.  Please return in 3 months with your sugar log.   KEEP UP THE GREAT JOB!

## 2016-07-27 NOTE — Progress Notes (Signed)
Patient ID: David [name], male   DOB: 1951-03-25, 65 y.o.   MRN: [id number]   HPI: David [name] is a 65 y.o.-year-old male, returning for f/u for DM2, dx in ~2008, insulin-dependent since 09/2015, uncontrolled, with complications (CAD - s/p stents in 2013, PN). Last visit 1.5 mo ago.  Last hemoglobin A1c was: 03/05/2016: HbA1c 9.1% Lab Results  Component Value Date   HGBA1C 9.1 03/05/2016   HGBA1C 10.1 (H) 08/01/2015   HGBA1C 10.5 (H) 05/16/2015   Component     Latest Ref Rng 06/09/2016  C-Peptide     0.80-3.85 ng/mL 1.78  Glucose, Fasting     65 - 99 mg/dL 184 (H)  Glutamic Acid Decarb Ab     <5 IU/mL <5  Pancreatic Islet Cell Antibody     < 5 JDF Units <5  Slight insulin deficiency, but no clear type 1 diabetes.  Pt is on a regimen of: - Lantus 22 units at bedtime >> Basaglar 34 units at bedtime - Metformin 1000 mg 2x a day, with meals >> no SEs - Glipizide 10 mg 2x a day - Bydureon 2 mg weekly  Pt checks his sugars 2-3x a day >> MUCH better: - am: 180-220 >> 125, 148-275 >> 139, 182-270 while on insulin >> 129, 143-226 >> 84, 113-170 - 2h after b'fast: n/c  - before lunch: n/c >> 153-226 >> 147-250 >> 127, 149-198 >> 57, 137-165 - 2h after lunch: n/c - before dinner: n/c >> 86-240 >> 94, 138-225 >> 72, 144-227 >> 73, 106-190, 235  (snacks in pm) - 2h after dinner: n/c - bedtime: n/c >> 162-286 >> 220-360 >> 80, 187-287, 310 >> 92-169 - nighttime: n/c No lows. Lowest sugar was 154 >> 94 >> 72 >> 57x1; ? hypoglycemia awareness Highest sugar was 240 >> 366 >> 360 >> 310 >> 200s.  Glucometer: One Touch  Pt's meals are: - Breakfast: protein shake - Lunch: n/a - Dinner: meat + veggies /potatoes or salad - Snacks: no No deserts. Diet sodas, ice tea with splenda.   - no CKD, last BUN/creatinine:  Lab Results  Component Value Date   BUN 13 06/09/2016   CREATININE 0.97 06/09/2016  He is on Losartan. - last set of lipids: Lab Results  Component Value Date   CHOL 145  06/09/2016   HDL 46.40 06/09/2016   LDLCALC 75 06/09/2016   LDLDIRECT 103.9 11/05/2013   TRIG 118.0 06/09/2016   CHOLHDL 3 06/09/2016  He is on Atorvastatin. - last eye exam was in 03/2016. No DR.  - + numbness and tingling in his feet.  He also has HL, HTN, depression. He has a history of anabolic steroid use for muscle building.  ROS: Constitutional: no weight gain/loss, no fatigue, no subjective hyperthermia/hypothermia Eyes: no blurry vision, no xerophthalmia ENT: no sore throat, no nodules palpated in throat, no dysphagia/odynophagia, no hoarseness Cardiovascular: no CP/SOB/palpitations/leg swelling Respiratory: no cough/SOB Gastrointestinal: no N/V/D/C Musculoskeletal: no muscle/joint aches Skin: no rashes Neurological: no tremors/numbness/tingling/dizziness  I reviewed pt's medications, allergies, PMH, social hx, family hx, and changes were documented in the history of present illness. Otherwise, unchanged from my initial visit note.   Past Medical History:  Diagnosis Date  . Allergy   . Arthritis   . CAD (coronary artery disease)    s/p bifurcation stenting (DES x2) and stenting bifurcation lesion distal RCA (1 DES)  . Depression   . Diabetes mellitus   . Hyperlipemia   . Hypertension   . Sleep apnea  CPAP   Past Surgical History:  Procedure Laterality Date  . CORONARY ANGIOPLASTY WITH STENT PLACEMENT   March 2010   Dr. Olevia Perches  . TONSILLECTOMY  1959   Social History   Social History  . Marital Status: Married    Spouse Name: N/A  . Number of Children: 2   Occupational History  . 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 Smoking status: Former Smoker    Types: Cigarettes    Quit date: 11/30/1983  . Smokeless tobacco: Never Used  . Alcohol Use:  beer, 3-4 times a week, 6-8 drinks at the time       . Drug Use: No   Social History Narrative   No living will   Would want wife, then son  David [name], as health care POA   Would accept resuscitation   Not sure about tube feeds   Current Outpatient Prescriptions on File Prior to Visit  Medication Sig Dispense Refill  . aspirin 81 MG tablet Take 81 mg by mouth daily.      . clopidogrel (PLAVIX) 75 MG tablet TAKE ONE TABLET BY MOUTH ONCE DAILY 90 tablet 3  . Exenatide ER (BYDUREON) 2 MG PEN Inject 2 mg weekly under skin 4 each 2  . glipiZIDE (GLUCOTROL) 10 MG tablet TAKE 1 TABLET BY MOUTH TWICE A DAY BEFORE A MEAL. 60 tablet 11  . glucose blood (ONE TOUCH ULTRA TEST) test strip Use to test blood sugar 2 times daily as instructed. Dx: E11.59, E11.65 100 each 5  . Insulin Glargine (BASAGLAR KWIKPEN) 100 UNIT/ML SOPN Inject 0.34 mLs (34 Units total) into the skin at bedtime. 5 pen 2  . Insulin Pen Needle 32G X 4 MM MISC Use as advised, once a day 100 each 11  . losartan (COZAAR) 100 MG tablet TAKE ONE TABLET BY MOUTH ONCE DAILY 30 tablet 11  . metFORMIN (GLUCOPHAGE) 1000 MG tablet TAKE ONE TABLET BY MOUTH TWICE DAILY WITH MEALS 60 tablet 11  . metoprolol succinate (TOPROL-XL) 25 MG 24 hr tablet TAKE ONE TABLET BY MOUTH ONCE DAILY 30 tablet 1  . naproxen sodium (ALEVE) 220 MG tablet Take 220-440 mg by mouth 2 (two) times daily as needed (pain).     Glory Rosebush DELICA LANCETS FINE MISC Use to test blood sugar 2 times daily as instructed. Dx: E11.59, E11.65 100 each 5  . PARoxetine (PAXIL) 20 MG tablet TAKE TWO TABLETS BY MOUTH ONCE DAILY FOR DEPRESSION 60 tablet 11  . rosuvastatin (CRESTOR) 10 MG tablet Take 1 tablet (10 mg total) by mouth daily. 30 tablet 11  . sildenafil (REVATIO) 20 MG tablet Take 60-100 mg by mouth daily as needed (for ED).     [name] triamcinolone cream (KENALOG) 0.1 % APPLY TO THE AFFECTED AREA TWICE DAILY. 45 g 1   No current facility-administered medications on file prior to visit.    Allergies  Allergen Reactions  . Venlafaxine     Slowed his urine stream   Family History  Problem Relation Age of Onset  . Heart  failure Father     Deceased 53 y/o  . Diabetes Father   . Cancer Father     Prostate  . Heart failure Mother     Deceased 23 y/o  . Alcohol abuse Mother   . Schizophrenia Son     Paranoid  . Uveitis Son   . Cancer Brother  prostate  . Colon cancer Neg Hx   . Esophageal cancer Neg Hx   . Stomach cancer Neg Hx    PE: BP 140/78 (BP Location: Left Arm, Patient Position: Sitting)   Pulse 70   Ht 5\' 10"  (1.778 m)   Wt 195 lb (88.5 kg)   SpO2 98%   BMI 27.98 kg/m  Body mass index is 27.98 kg/m. Wt Readings from Last 3 Encounters:  07/27/16 195 lb (88.5 kg)  07/13/16 196 lb 8 oz (89.1 kg)  06/09/16 195 lb (88.5 kg)   Constitutional: Slightly overweight, in NAD Eyes: PERRLA, EOMI, no exophthalmos ENT: moist mucous membranes, no thyromegaly, no cervical lymphadenopathy Cardiovascular: RRR, No MRG Respiratory: CTA B Gastrointestinal: abdomen soft, NT, ND, BS+ Musculoskeletal: no deformities, strength intact in all 4 Skin: moist, warm, no rashes Neurological: no tremor with outstretched hands, DTR normal in all 4  ASSESSMENT: 1. DM2, insulin-dependent, uncontrolled, with complications - CAD - s/p 4 stents 2013 - seeing Dr Burt Knack - PN  PLAN:  1. Patient with long-standing, uncontrolled diabetes, on oral antidiabetic regimen + basal insulin at last visit and now on GLP1 R agonist (Bydureon). Sugars are much better! - due to CBG variability >> we checked a C peptide and anti-pancreatic antibodies >> negative for DM1 - I suggested to:  Patient Instructions  Please continue: - Metformin 1000 mg 2x a day, with meals - Glipizide 10 mg 2x a day before meals - Basaglar 34 units at bedtime. - Bydureon 2 mg weekly.  Please return in 3 months with your sugar log.    - advised for yearly eye exams >> he is UTD - will check HbA1c now >> 6.3% (GREAT!) - Return to clinic in 3 mo with sugar log   Philemon Kingdom, MD PhD [facility] Endocrinology

## 2016-08-03 DIAGNOSIS — M7702 Medial epicondylitis, left elbow: Secondary | ICD-10-CM | POA: Diagnosis not present

## 2016-08-03 DIAGNOSIS — M65332 Trigger finger, left middle finger: Secondary | ICD-10-CM | POA: Diagnosis not present

## 2016-08-27 ENCOUNTER — Ambulatory Visit (INDEPENDENT_AMBULATORY_CARE_PROVIDER_SITE_OTHER): Payer: Medicare Other

## 2016-08-27 DIAGNOSIS — Z23 Encounter for immunization: Secondary | ICD-10-CM

## 2016-09-01 ENCOUNTER — Other Ambulatory Visit: Payer: Self-pay | Admitting: Internal Medicine

## 2016-09-04 ENCOUNTER — Encounter: Payer: Self-pay | Admitting: Internal Medicine

## 2016-09-06 ENCOUNTER — Encounter: Payer: Self-pay | Admitting: Cardiovascular Disease

## 2016-09-06 ENCOUNTER — Ambulatory Visit (INDEPENDENT_AMBULATORY_CARE_PROVIDER_SITE_OTHER): Payer: Medicare Other | Admitting: Cardiovascular Disease

## 2016-09-06 ENCOUNTER — Other Ambulatory Visit: Payer: Self-pay

## 2016-09-06 VITALS — BP 140/78 | HR 74 | Ht 70.0 in | Wt 194.8 lb

## 2016-09-06 DIAGNOSIS — I10 Essential (primary) hypertension: Secondary | ICD-10-CM | POA: Diagnosis not present

## 2016-09-06 DIAGNOSIS — E784 Other hyperlipidemia: Secondary | ICD-10-CM | POA: Diagnosis not present

## 2016-09-06 DIAGNOSIS — E7849 Other hyperlipidemia: Secondary | ICD-10-CM

## 2016-09-06 MED ORDER — EXENATIDE ER 2 MG ~~LOC~~ PEN: PEN_INJECTOR | SUBCUTANEOUS | 2 refills | Status: DC

## 2016-09-06 NOTE — Progress Notes (Signed)
Cardiology Office Note Date:  09/06/2016   ID:  David [name], DOB 1951/02/15, MRN [id number]  PCP:  Viviana Simpler, MD  Cardiologist:  Sherren Mocha, MD    Chief Complaint  Patient presents with  . Coronary Artery Disease     History of Present Illness: David [name] is a 65 y.o. male who presents for follow-up of CAD. He underwent complex PCI in 2010 involving the left circumflex/obtuse marginal bifurcation as well as the distal right coronary artery bifurcation. He has been maintained on long-term dual antiplatelets therapy with aspirin and clopidogrel.  When he was seen 1 year ago his diabetes was uncontrolled. Medications have been adjusted and he is doing much better now. He is trying to eat better. He's had no exertional chest pain or pressure. He denies dyspnea or edema. No heart palpitations. Prior to his PCI in 2010, he experienced discomfort across the upper chest. He's had no recurrence of this. He does complain of easy bruising.  Past Medical History:  Diagnosis Date  . Allergy   . Arthritis   . CAD (coronary artery disease)    s/p bifurcation stenting (DES x2) and stenting bifurcation lesion distal RCA (1 DES)  . Depression   . Diabetes mellitus   . Hyperlipemia   . Hypertension   . Sleep apnea    CPAP    Past Surgical History:  Procedure Laterality Date  . CORONARY ANGIOPLASTY WITH STENT PLACEMENT   March 2010   Dr. Olevia Perches  . TONSILLECTOMY  1959    Current Outpatient Prescriptions  Medication Sig Dispense Refill  . aspirin 81 MG tablet Take 81 mg by mouth daily.      [name] BYDUREON 2 MG PEN INJECT ONE PEN WEEKLY UNDER THE SKIN 4 each 2  . clopidogrel (PLAVIX) 75 MG tablet TAKE ONE TABLET BY MOUTH ONCE DAILY 90 tablet 3  . glipiZIDE (GLUCOTROL) 10 MG tablet TAKE 1 TABLET BY MOUTH TWICE A DAY BEFORE A MEAL. 60 tablet 11  . glucose blood (ONE TOUCH ULTRA TEST) test strip Use to test blood sugar 2 times daily as instructed. Dx: E11.59, E11.65 100 each 5  .  Insulin Glargine (BASAGLAR KWIKPEN) 100 UNIT/ML SOPN Inject 0.34 mLs (34 Units total) into the skin at bedtime. 5 pen 2  . Insulin Glargine (LANTUS SOLOSTAR) 100 UNIT/ML Solostar Pen Inject 34 Units into the skin daily at 10 pm. 5 pen 5  . Insulin Pen Needle 32G X 4 MM MISC Use as advised, once a day 100 each 11  . losartan (COZAAR) 100 MG tablet TAKE ONE TABLET BY MOUTH ONCE DAILY 30 tablet 11  . metFORMIN (GLUCOPHAGE) 1000 MG tablet TAKE ONE TABLET BY MOUTH TWICE DAILY WITH MEALS 60 tablet 11  . metoprolol succinate (TOPROL-XL) 25 MG 24 hr tablet TAKE ONE TABLET BY MOUTH ONCE DAILY 30 tablet 1  . naproxen sodium (ALEVE) 220 MG tablet Take 220-440 mg by mouth 2 (two) times daily as needed (pain).     Glory Rosebush DELICA LANCETS FINE MISC Use to test blood sugar 2 times daily as instructed. Dx: E11.59, E11.65 100 each 5  . PARoxetine (PAXIL) 20 MG tablet TAKE TWO TABLETS BY MOUTH ONCE DAILY FOR DEPRESSION 60 tablet 11  . rosuvastatin (CRESTOR) 10 MG tablet Take 1 tablet (10 mg total) by mouth daily. 30 tablet 11  . sildenafil (REVATIO) 20 MG tablet Take 60-100 mg by mouth daily as needed (for ED).     [name] triamcinolone cream (KENALOG) 0.1 %  APPLY TO THE AFFECTED AREA TWICE DAILY. 45 g 1   No current facility-administered medications for this visit.     Allergies:   Venlafaxine   Social History:  The patient  reports that he quit smoking about 32 years ago. His smoking use included Cigarettes. He has never used smokeless tobacco. He reports that he drinks about 2.5 oz of alcohol per week . He reports that he does not use drugs.   Family History:  The patient's  family history includes Alcohol abuse in his mother; Cancer in his brother and father; Diabetes in his father; Heart failure in his father and mother; Schizophrenia in his son; Uveitis in his son.    ROS:  Please see the history of present illness.  Otherwise, review of systems is positive for leg pain.  All other systems are reviewed and  negative.    PHYSICAL EXAM: VS:  BP 140/78   Pulse 74   Ht 5\' 10"  (1.778 m)   Wt 194 lb 12.8 oz (88.4 kg)   BMI 27.95 kg/m  , BMI Body mass index is 27.95 kg/m. GEN: Well nourished, well developed, in no acute distress  HEENT: normal  Neck: no JVD, no masses. No carotid bruits Cardiac: RRR without murmur or gallop                Respiratory:  clear to auscultation bilaterally, normal work of breathing GI: soft, nontender, nondistended, + BS MS: no deformity or atrophy  Ext: no pretibial edema, pedal pulses 2+= bilaterally Skin: warm and dry, no rash Neuro:  Strength and sensation are intact Psych: euthymic mood, full affect  EKG:  EKG is ordered today. The ekg ordered today shows NSR 74 bpm, within normal limits  Recent Labs: 06/09/2016: BUN 13; Creat 0.97; Potassium 4.5; Sodium 140 07/13/2016: ALT 58; Hemoglobin 12.6; Platelets 114.0   Lipid Panel     Component Value Date/Time   CHOL 145 06/09/2016 0836   TRIG 118.0 06/09/2016 0836   HDL 46.40 06/09/2016 0836   CHOLHDL 3 06/09/2016 0836   VLDL 23.6 06/09/2016 0836   LDLCALC 75 06/09/2016 0836   LDLDIRECT 103.9 11/05/2013 1249      Wt Readings from Last 3 Encounters:  09/06/16 194 lb 12.8 oz (88.4 kg)  07/27/16 195 lb (88.5 kg)  07/13/16 196 lb 8 oz (89.1 kg)     Cardiac Studies Reviewed: Cardiac Cath 01/03/09: CONCLUSION: Coronary artery disease with 50% narrowing in the mid LAD, 80% narrowing in the mid circumflex artery with 90% stenosis in the first and second posterolateral branches of the circumflex artery (bifurcation lesion) and 95% stenosis of distal right coronary artery with 95% stenosis in the posterior descending branch of the right coronary artery (bifurcation lesion) and normal LV function.  RECOMMENDATIONS: The patient is quite symptomatic and has critical disease in the circumflex and right coronary arteries. Both lesions are bifurcation lesions, which will make them technically  more difficult but I think percutaneous intervention is the best option. We will plan to do the circumflex lesion today and plan on a staged procedure for the right early next week.  PCI 01/03/09: CONCLUSION: Successful PCI of the mid and distal circumflex artery and the posterolateral branch of the circumflex artery (bifurcation lesion using 2 Xience stents in the main circumflex artery and a third Xience stent in the side branch, which was the posterolateral branch crush technique) with improvement in the main channel from 80% and 90% to 0%, improvement in the side  channel from 90% to 0%.  DISPOSITION: The patient returned to [facility] room for further observation. We will reevaluate him tomorrow and plan to treat the right coronary artery on Monday. We will decide whether he should stay in the hospital or whether he may be able to go home tomorrow.  PCI 01/07/09: RESULTS: Initially, the stenosis in the distal right coronary artery into the posterior descending branch was estimated at 95%. Following stenting, this improved to 0%.  Initially stenosis in the distal right coronary artery stenting in the posterolateral branch was 95%. Following cutting balloon angioplasty, this improved to 30% and following stenting at the ostium, re-narrowed to 80%.  CONCLUSION: Successful percutaneous coronary intervention of a bifurcation lesion in the posterior descending and posterolateral branches of the right coronary artery with improvement in the distal right and posterior descending artery stenosis from 95% to 0% with preservation of flow in the posterolateral branch with 80% residual stenosis at the ostium.  ASSESSMENT AND PLAN: 1.  CAD, native vessel, without symptoms of angina: With diabetes and history of multivessel/bifurcational stenting, have recommended long-term dual antiplatelet therapy with aspirin and clopidogrel. Other than easy bruising he  is tolerating this relatively well.  2. Hyperlipidemia:last lipids reviewed with a cholesterol of 145, triglycerides 118, HDL 46, LDL 75. Continue crestor 10 mg daily. He tried QOD dosing but didn't notice any difference in his leg pain.  3. Essential hypertension: BP controlled on current Rx. Medications reviewed and appropriate (beta-blocker/ARB). No changes today.   4. Type 2 diabetes: much better controlled than in the past with hemoglobin A1c 6.3 (previously 9.1 in April 2017). Followed by Dr Cruzita Lederer.  Current medicines are reviewed with the patient today.  The patient does not have concerns regarding medicines.  Labs/ tests ordered today include:   Orders Placed This Encounter  Procedures  . EKG 12-Lead    Disposition:   FU one year  Signed, Sherren Mocha, MD  09/06/2016 10:01 AM    [facility] [address], [place], [place]  [address] Phone: [phone]; Fax: [phone]

## 2016-09-06 NOTE — Patient Instructions (Signed)

## 2016-09-09 ENCOUNTER — Encounter: Payer: Self-pay | Admitting: Gastroenterology

## 2016-09-09 ENCOUNTER — Encounter: Payer: Self-pay | Admitting: Internal Medicine

## 2016-09-09 ENCOUNTER — Other Ambulatory Visit: Payer: Self-pay | Admitting: Internal Medicine

## 2016-09-16 ENCOUNTER — Other Ambulatory Visit: Payer: Self-pay | Admitting: Cardiovascular Disease

## 2016-09-23 ENCOUNTER — Other Ambulatory Visit: Payer: Self-pay | Admitting: Cardiovascular Disease

## 2016-09-30 ENCOUNTER — Other Ambulatory Visit: Payer: Self-pay | Admitting: Internal Medicine

## 2016-10-10 ENCOUNTER — Encounter: Payer: Self-pay | Admitting: Internal Medicine

## 2016-10-20 ENCOUNTER — Other Ambulatory Visit: Payer: Self-pay | Admitting: Cardiovascular Disease

## 2016-11-02 ENCOUNTER — Ambulatory Visit (INDEPENDENT_AMBULATORY_CARE_PROVIDER_SITE_OTHER): Payer: Medicare Other | Admitting: Internal Medicine

## 2016-11-02 ENCOUNTER — Encounter: Payer: Self-pay | Admitting: Internal Medicine

## 2016-11-02 VITALS — BP 132/79 | HR 75 | Wt 198.0 lb

## 2016-11-02 DIAGNOSIS — E1165 Type 2 diabetes mellitus with hyperglycemia: Secondary | ICD-10-CM | POA: Diagnosis not present

## 2016-11-02 DIAGNOSIS — E1159 Type 2 diabetes mellitus with other circulatory complications: Secondary | ICD-10-CM

## 2016-11-02 LAB — POCT GLYCOSYLATED HEMOGLOBIN (HGB A1C): Hemoglobin A1C: 6.9

## 2016-11-02 MED ORDER — EXENATIDE ER 2 MG ~~LOC~~ PEN: PEN_INJECTOR | SUBCUTANEOUS | 3 refills | Status: DC

## 2016-11-02 NOTE — Patient Instructions (Addendum)
Please continue: - Metformin 1000 mg 2x a day, with meals - Glipizide 10 mg 2x a day, before meals - Lantus 34 units at bedtime.  Please return in 2 months with your sugar log.

## 2016-11-02 NOTE — Progress Notes (Signed)
Patient ID: David [name], male   DOB: [birth year], 65 y.o.   MRN: [id number]   HPI: David [name] is a 65 y.o.-year-old male, returning for f/u for DM2, dx in ~2008, insulin-dependent since 09/2015, uncontrolled, with complications (CAD - s/p stents in 2013, PN). Last visit 3 mo ago.  Last hemoglobin A1c was: Lab Results  Component Value Date   HGBA1C 6.3 07/27/2016   HGBA1C 9.1 03/05/2016   HGBA1C 10.1 (H) 08/01/2015  03/05/2016: HbA1c 9.1%  Pt is on a regimen of: - Lantus 22 units at bedtime >> Basaglar 34 units at bedtime >> off for 1 month - restarted in last weekend - Metformin 1000 mg 2x a day, with meals  - Glipizide 10 mg 2x a day He had to Bydureon b/c in donut hole.  Pt checks his sugars 2-3x a day >> MUCH better: - am: 180-220 >> 125, 148-275 >> 139, 182-270 while on insulin >> 129, 143-226 >> 84, 113-170 >> 108-274 (!) - 2h after b'fast: n/c  - before lunch: n/c >> 153-226 >> 147-250 >> 127, 149-198 >> 57, 137-165 >> 96-180 - 2h after lunch: n/c - before dinner: n/c >> 86-240 >> 94, 138-225 >> 72, 144-227 >> 73, 106-190, 235  (snacks in pm) >> 84-193, 235 - 2h after dinner: n/c - bedtime: n/c >> 162-286 >> 220-360 >> 80, 187-287, 310 >> 92-169 >> 131, 182-246, 359 - nighttime: n/c No lows. Lowest sugar was 154 >> 94 >> 72 >> 57x1 >> 84; ? hypoglycemia awareness Highest sugar was 240 >> 366 >> 360 >> 310 >> 200s >> 359.  Glucometer: One Touch  Pt's meals are: - Breakfast: protein shake - Lunch: n/a - Dinner: meat + veggies /potatoes or salad - Snacks: no No deserts. Diet sodas, ice tea with splenda.   - no CKD, last BUN/creatinine:  Lab Results  Component Value Date   BUN 13 06/09/2016   CREATININE 0.97 06/09/2016  He is on Losartan. - last set of lipids: Lab Results  Component Value Date   CHOL 145 06/09/2016   HDL 46.40 06/09/2016   LDLCALC 75 06/09/2016   LDLDIRECT 103.9 11/05/2013   TRIG 118.0 06/09/2016   CHOLHDL 3 06/09/2016  He is on  Atorvastatin. - last eye exam was in 03/2016. No DR.  - + numbness and tingling in his feet.  He also has HL, HTN, depression. He has a history of anabolic steroid use for muscle building.  ROS: Constitutional: no weight gain/loss, no fatigue, no subjective hyperthermia/hypothermia Eyes: no blurry vision, no xerophthalmia ENT: no sore throat, no nodules palpated in throat, no dysphagia/odynophagia, no hoarseness Cardiovascular: no CP/SOB/palpitations/leg swelling Respiratory: no cough/SOB Gastrointestinal: no N/V/D/C Musculoskeletal: no muscle/joint aches Skin: no rashes Neurological: no tremors/numbness/tingling/dizziness  I reviewed pt's medications, allergies, PMH, social hx, family hx, and changes were documented in the history of present illness. Otherwise, unchanged from my initial visit note.   Past Medical History:  Diagnosis Date  . Allergy   . Arthritis   . CAD (coronary artery disease)    s/p bifurcation stenting (DES x2) and stenting bifurcation lesion distal RCA (1 DES)  . Depression   . Diabetes mellitus   . Hyperlipemia   . Hypertension   . Sleep apnea    CPAP   Past Surgical History:  Procedure Laterality Date  . CORONARY ANGIOPLASTY WITH STENT PLACEMENT   March 2010   Dr. Olevia Perches  . TONSILLECTOMY  1959   Social History   Social History  .  Marital Status: Married    Spouse Name: N/A  . Number of Children: 2   Occupational History  . 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 Smoking status: Former Smoker    Types: Cigarettes    Quit date: 11/30/1983  . Smokeless tobacco: Never Used  . Alcohol Use:  beer, 3-4 times a week, 6-8 drinks at the time       . Drug Use: No   Social History Narrative   No living will   Would want wife, then son [name], as health care POA   Would accept resuscitation   Not sure about tube feeds   Current Outpatient Prescriptions on File Prior to Visit   Medication Sig Dispense Refill  . aspirin 81 MG tablet Take 81 mg by mouth daily.      . clopidogrel (PLAVIX) 75 MG tablet TAKE ONE TABLET BY MOUTH ONCE DAILY 90 tablet 3  . Exenatide ER (BYDUREON) 2 MG PEN INJECT ONE PEN WEEKLY UNDER THE SKIN 4 each 2  . glipiZIDE (GLUCOTROL) 10 MG tablet TAKE 1 TABLET BY MOUTH TWICE A DAY BEFORE A MEAL. 60 tablet 11  . glucose blood (ONE TOUCH ULTRA TEST) test strip Use to test blood sugar 2 times daily as instructed. Dx: E11.59, E11.65 100 each 5  . Insulin Glargine (LANTUS SOLOSTAR) 100 UNIT/ML Solostar Pen Inject 34 Units into the skin daily at 10 pm. 5 pen 5  . Insulin Pen Needle 32G X 4 MM MISC Use as advised, once a day 100 each 11  . losartan (COZAAR) 100 MG tablet TAKE ONE TABLET BY MOUTH ONCE DAILY 90 tablet 3  . metFORMIN (GLUCOPHAGE) 1000 MG tablet TAKE ONE TABLET BY MOUTH TWICE DAILY WITH MEALS 180 tablet 1  . metoprolol succinate (TOPROL-XL) 25 MG 24 hr tablet TAKE ONE TABLET BY MOUTH ONCE DAILY 90 tablet 3  . naproxen sodium (ALEVE) 220 MG tablet Take 220-440 mg by mouth 2 (two) times daily as needed (pain).     Glory Rosebush DELICA LANCETS FINE MISC Use to test blood sugar 2 times daily as instructed. Dx: E11.59, E11.65 100 each 5  . PARoxetine (PAXIL) 20 MG tablet TAKE TWO TABLETS BY MOUTH ONCE DAILY FOR  DEPRESSION 60 tablet 2  . rosuvastatin (CRESTOR) 10 MG tablet Take 1 tablet (10 mg total) by mouth daily. 30 tablet 11  . sildenafil (REVATIO) 20 MG tablet Take 60-100 mg by mouth daily as needed (for ED).     [name] triamcinolone cream (KENALOG) 0.1 % APPLY TO THE AFFECTED AREA TWICE DAILY. 45 g 1   No current facility-administered medications on file prior to visit.    Allergies  Allergen Reactions  . Venlafaxine     Slowed his urine stream   Family History  Problem Relation Age of Onset  . Heart failure Father     Deceased 12 y/o  . Diabetes Father   . Cancer Father     Prostate  . Heart failure Mother     Deceased 47 y/o  . Alcohol  abuse Mother   . Schizophrenia Son     Paranoid  . Uveitis Son   . Cancer Brother     prostate  . Colon cancer Neg Hx   . Esophageal cancer Neg Hx   . Stomach cancer Neg Hx    PE: BP 132/79   Pulse 75   Wt 198 lb (  89.8 kg)   BMI 28.41 kg/m  Body mass index is 28.41 kg/m. Wt Readings from Last 3 Encounters:  11/02/16 198 lb (89.8 kg)  09/06/16 194 lb 12.8 oz (88.4 kg)  07/27/16 195 lb (88.5 kg)   Constitutional: Slightly overweight, in NAD Eyes: PERRLA, EOMI, no exophthalmos ENT: moist mucous membranes, no thyromegaly, no cervical lymphadenopathy Cardiovascular: RRR, No MRG Respiratory: CTA B Gastrointestinal: abdomen soft, NT, ND, BS+ Musculoskeletal: no deformities, strength intact in all 4 Skin: moist, warm, no rashes Neurological: no tremor with outstretched hands, DTR normal in all 4  ASSESSMENT: 1. DM2, insulin-dependent, uncontrolled, with complications - CAD - s/p 4 stents 2013 - seeing Dr Burt Knack - PN  Component     Latest Ref Rng 06/09/2016  C-Peptide     0.80-3.85 ng/mL 1.78  Glucose, Fasting     65 - 99 mg/dL 184 (H)  Glutamic Acid Decarb Ab     <5 IU/mL <5  Pancreatic Islet Cell Antibody     < 5 JDF Units <5  Slight insulin deficiency, but no clear type 1 diabetes.  PLAN:  1. Patient with long-standing, uncontrolled diabetes, on oral antidiabetic regimen + basal insulin at last visit and previously on GLP1 R agonist (Bydureon). Sugars were much better at last visit, but we had to stop Bydureon as he is in the donut hole. Right after he stopped Bydureon, he ran out of insulin >> he has been out x 1 mo, restarted last weekend. Sugars have been higher since last visit. He plans to refill Bydureon right after the first of the year. - I suggested to:  Patient Instructions  Please continue: - Metformin 1000 mg 2x a day, with meals - Glipizide 10 mg 2x a day, before meals - Lantus 34 units at bedtime.  Please return in 2 months with your sugar log.   -  advised for yearly eye exams >> he is UTD - he is UTD with flu shot - will check HbA1c now >> 6.9% (higher) - Return to clinic in 3 mo with sugar log   Philemon Kingdom, MD PhD [facility] Endocrinology

## 2016-12-28 ENCOUNTER — Ambulatory Visit: Payer: Self-pay | Admitting: Internal Medicine

## 2017-01-13 ENCOUNTER — Other Ambulatory Visit: Payer: Self-pay | Admitting: Internal Medicine

## 2017-01-18 ENCOUNTER — Ambulatory Visit (INDEPENDENT_AMBULATORY_CARE_PROVIDER_SITE_OTHER): Payer: Medicare Other | Admitting: Internal Medicine

## 2017-01-18 ENCOUNTER — Encounter: Payer: Self-pay | Admitting: Internal Medicine

## 2017-01-18 VITALS — BP 130/80 | HR 64 | Temp 98.5°F | Wt 194.0 lb

## 2017-01-18 DIAGNOSIS — F330 Major depressive disorder, recurrent, mild: Secondary | ICD-10-CM | POA: Diagnosis not present

## 2017-01-18 DIAGNOSIS — E1165 Type 2 diabetes mellitus with hyperglycemia: Secondary | ICD-10-CM | POA: Insufficient documentation

## 2017-01-18 DIAGNOSIS — I2510 Atherosclerotic heart disease of native coronary artery without angina pectoris: Secondary | ICD-10-CM | POA: Diagnosis not present

## 2017-01-18 DIAGNOSIS — R945 Abnormal results of liver function studies: Secondary | ICD-10-CM

## 2017-01-18 DIAGNOSIS — Z23 Encounter for immunization: Secondary | ICD-10-CM

## 2017-01-18 DIAGNOSIS — E1159 Type 2 diabetes mellitus with other circulatory complications: Secondary | ICD-10-CM

## 2017-01-18 DIAGNOSIS — R7989 Other specified abnormal findings of blood chemistry: Secondary | ICD-10-CM | POA: Diagnosis not present

## 2017-01-18 DIAGNOSIS — Z794 Long term (current) use of insulin: Secondary | ICD-10-CM

## 2017-01-18 LAB — HM DIABETES FOOT EXAM

## 2017-01-18 NOTE — Progress Notes (Signed)
Subjective:    Patient ID: David [name], male    DOB: [birth year], 66 y.o.   MRN: [id number]  HPI Here for follow up of mood and diabetes  He feels his mood is good Still working part time---mostly enjoys this Continues to have problems with son (mental illness and drug dependence)  Diabetes control has been fairly good A1c up to 6.9% at last visit Trouble affording the bydureon so has had to skip at times ($270) Mild numbness in feet first thing in AM---no sig pain  No chest pain No breathing problems Sees Dr Burt Knack once a year for his heart  Current Outpatient Prescriptions on File Prior to Visit  Medication Sig Dispense Refill  . aspirin 81 MG tablet Take 81 mg by mouth daily.      . clopidogrel (PLAVIX) 75 MG tablet TAKE ONE TABLET BY MOUTH ONCE DAILY 90 tablet 3  . glipiZIDE (GLUCOTROL) 10 MG tablet TAKE 1 TABLET BY MOUTH TWICE A DAY BEFORE A MEAL. 60 tablet 11  . glucose blood (ONE TOUCH ULTRA TEST) test strip Use to test blood sugar 2 times daily as instructed. Dx: E11.59, E11.65 100 each 5  . Insulin Glargine (LANTUS SOLOSTAR) 100 UNIT/ML Solostar Pen Inject 34 Units into the skin daily at 10 pm. 5 pen 5  . Insulin Pen Needle 32G X 4 MM MISC Use as advised, once a day 100 each 11  . losartan (COZAAR) 100 MG tablet TAKE ONE TABLET BY MOUTH ONCE DAILY 90 tablet 3  . metFORMIN (GLUCOPHAGE) 1000 MG tablet TAKE ONE TABLET BY MOUTH TWICE DAILY WITH MEALS 180 tablet 1  . metoprolol succinate (TOPROL-XL) 25 MG 24 hr tablet TAKE ONE TABLET BY MOUTH ONCE DAILY 90 tablet 3  . naproxen sodium (ALEVE) 220 MG tablet Take 220-440 mg by mouth 2 (two) times daily as needed (pain).     Glory Rosebush DELICA LANCETS FINE MISC Use to test blood sugar 2 times daily as instructed. Dx: E11.59, E11.65 100 each 5  . PARoxetine (PAXIL) 20 MG tablet TAKE TWO TABLETS BY MOUTH ONCE DAILY FOR DEPRESSION 60 tablet 11  . rosuvastatin (CRESTOR) 10 MG tablet Take 1 tablet (10 mg total) by mouth daily. 30 tablet  11  . sildenafil (REVATIO) 20 MG tablet Take 60-100 mg by mouth daily as needed (for ED).     [name] triamcinolone cream (KENALOG) 0.1 % APPLY TO THE AFFECTED AREA TWICE DAILY. 45 g 1  . Exenatide ER (BYDUREON) 2 MG PEN INJECT ONE PEN WEEKLY UNDER THE SKIN (Patient not taking: Reported on 01/18/2017) 12 each 3   No current facility-administered medications on file prior to visit.     Allergies  Allergen Reactions  . Venlafaxine     Slowed his urine stream    Past Medical History:  Diagnosis Date  . Allergy   . Arthritis   . CAD (coronary artery disease)    s/p bifurcation stenting (DES x2) and stenting bifurcation lesion distal RCA (1 DES)  . Depression   . Diabetes mellitus   . Hyperlipemia   . Hypertension   . Sleep apnea    CPAP    Past Surgical History:  Procedure Laterality Date  . CORONARY ANGIOPLASTY WITH STENT PLACEMENT   March 2010   Dr. Olevia Perches  . TONSILLECTOMY  1959    Family History  Problem Relation Age of Onset  . Heart failure Father     Deceased 32 y/o  . Diabetes Father   . Cancer  Father     Prostate  . Heart failure Mother     Deceased 52 y/o  . Alcohol abuse Mother   . Schizophrenia Son     Paranoid  . Uveitis Son   . Cancer Brother     prostate  . Colon cancer Neg Hx   . Esophageal cancer Neg Hx   . Stomach cancer Neg Hx     Social History   Social History  . Marital status: Married    Spouse name: N/A  . Number of children: N/A  . Years of education: N/A   Occupational History  . 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 Smoking status: Former Smoker    Types: Cigarettes    Quit date: 11/30/1983  . Smokeless tobacco: Never Used  . Alcohol use 2.5 oz/week    5 Standard drinks or equivalent per week  . Drug use: No  . Sexual activity: Not on file   Other Topics Concern  . Not on file   Social History Narrative   No living will   Would want wife, then son [name], as  health care POA   Would accept resuscitation   Not sure about tube feeds   Review of Systems  Sleeps well Appetite is good Weight is stable Has cut back on the alcohol--- formerly 8-10 beers a day (now down to 6-8 per day) Wife and he don't think he has a dependence problem     Objective:   Physical Exam  Constitutional: He appears well-nourished. No distress.  Cardiovascular: Normal rate, regular rhythm and normal heart sounds.  Exam reveals no gallop.   No murmur heard. Feet warm and with hair--but pulses not palpable  Pulmonary/Chest: Effort normal and breath sounds normal. No respiratory distress. He has no wheezes. He has no rales.  Musculoskeletal: He exhibits no edema.  Neurological:  Normal sensation in feet  Skin:  No foot lesions  Psychiatric: He has a normal mood and affect. His behavior is normal.          Assessment & Plan:

## 2017-01-18 NOTE — Assessment & Plan Note (Signed)
Control has been good working with Dr Cruzita Lederer

## 2017-01-18 NOTE — Assessment & Plan Note (Signed)
Mood has been better Will continue the sertraline--probably indefinitely

## 2017-01-18 NOTE — Assessment & Plan Note (Signed)
Has cut back some on beer Urged him to decrease to 3-4 per day at most Will recheck next time

## 2017-01-18 NOTE — Addendum Note (Signed)
Addended by: Pilar Grammes on: 01/18/2017 10:08 AM   Modules accepted: Orders

## 2017-01-18 NOTE — Assessment & Plan Note (Signed)
No recent problems Continues with cardiologist

## 2017-01-18 NOTE — Progress Notes (Signed)
Pre visit review using our clinic review tool, if applicable. No additional management support is needed unless otherwise documented below in the visit note. 

## 2017-01-27 ENCOUNTER — Other Ambulatory Visit: Payer: Self-pay | Admitting: Internal Medicine

## 2017-01-28 NOTE — Telephone Encounter (Signed)
Last filled 03/2016. Last OV 12/2016

## 2017-01-28 NOTE — Telephone Encounter (Signed)
Approved: #45 gm x 1

## 2017-02-01 ENCOUNTER — Other Ambulatory Visit: Payer: Self-pay | Admitting: Internal Medicine

## 2017-02-01 DIAGNOSIS — M79641 Pain in right hand: Secondary | ICD-10-CM | POA: Diagnosis not present

## 2017-02-01 DIAGNOSIS — M65332 Trigger finger, left middle finger: Secondary | ICD-10-CM | POA: Diagnosis not present

## 2017-02-01 DIAGNOSIS — M180 Bilateral primary osteoarthritis of first carpometacarpal joints: Secondary | ICD-10-CM | POA: Diagnosis not present

## 2017-02-01 DIAGNOSIS — M7702 Medial epicondylitis, left elbow: Secondary | ICD-10-CM | POA: Diagnosis not present

## 2017-02-11 DIAGNOSIS — M1812 Unilateral primary osteoarthritis of first carpometacarpal joint, left hand: Secondary | ICD-10-CM | POA: Diagnosis not present

## 2017-02-11 DIAGNOSIS — M65332 Trigger finger, left middle finger: Secondary | ICD-10-CM | POA: Diagnosis not present

## 2017-02-18 ENCOUNTER — Encounter: Payer: Self-pay | Admitting: Internal Medicine

## 2017-02-18 ENCOUNTER — Ambulatory Visit (INDEPENDENT_AMBULATORY_CARE_PROVIDER_SITE_OTHER): Payer: Medicare Other | Admitting: Internal Medicine

## 2017-02-18 VITALS — BP 138/88 | HR 62 | Ht 69.0 in | Wt 196.0 lb

## 2017-02-18 DIAGNOSIS — E1159 Type 2 diabetes mellitus with other circulatory complications: Secondary | ICD-10-CM | POA: Diagnosis not present

## 2017-02-18 DIAGNOSIS — E1165 Type 2 diabetes mellitus with hyperglycemia: Secondary | ICD-10-CM

## 2017-02-18 LAB — POCT GLYCOSYLATED HEMOGLOBIN (HGB A1C): Hemoglobin A1C: 8.3

## 2017-02-18 NOTE — Addendum Note (Signed)
Addended by: Caprice Beaver T on: 02/18/2017 10:05 AM   Modules accepted: Orders

## 2017-02-18 NOTE — Progress Notes (Signed)
Patient ID: David [name], male   DOB: [birth year], 66 y.o.   MRN: [id number]   HPI: David [name] is a 66 y.o.-year-old male, returning for f/u for DM2, dx in ~2008, insulin-dependent since 09/2015, uncontrolled, with complications (CAD - s/p stents in 2013, PN). Last visit 3.5 mo ago.  He restarted Bydureon 3 weeks ago. He ran out of Lantus 3 days ago.  Last hemoglobin A1c was: Lab Results  Component Value Date   HGBA1C 6.9 11/02/2016   HGBA1C 6.3 07/27/2016   HGBA1C 9.1 03/05/2016  03/05/2016: HbA1c 9.1%  Pt is on a regimen of: - Lantus 22 units at bedtime >> 34 units at bedtime  - Metformin 1000 mg 2x a day, with meals  - Glipizide 10 mg 2x a day - Bydureon 2 mg weekly - started 01/30/2017 He had to stop Bydureon in 2017 b/c in donut hole.  Pt checks his sugars 2-3x a day: - am: 129, 143-226 >> 84, 113-170 >> 108-274 (!) >> 150-250, 315 (forgot Lantus) - 2h after b'fast: n/c  - before lunch: 127, 149-198 >> 57, 137-165 >> 96-180 >> 108-202, 262 - 2h after lunch: n/c - before dinner:  73, 106-190, 235  (snacks in pm) >> 84-193, 235 >> 117-151, 193 - 2h after dinner: n/c - bedtime:  92-169 >> 131, 182-246, 359 >> 134-341 - nighttime: n/c No lows. Lowest sugar was 154 >> 94 >> 72 >> 57x1 >> 84 >> 108; ? hypoglycemia awareness Highest sugar was 240 >> 366 >> 360 >> 310 >> 200s >> 359 >> 422 (off Lantus).  Glucometer: One Touch  Pt's meals are: - Breakfast: protein shake - Lunch: n/a - Dinner: meat + veggies /potatoes or salad - Snacks: no No desserts. Diet sodas, ice tea with splenda.   - no CKD, last BUN/creatinine:  Lab Results  Component Value Date   BUN 13 06/09/2016   CREATININE 0.97 06/09/2016  He is on Losartan. - last set of lipids: Lab Results  Component Value Date   CHOL 145 06/09/2016   HDL 46.40 06/09/2016   LDLCALC 75 06/09/2016   LDLDIRECT 103.9 11/05/2013   TRIG 118.0 06/09/2016   CHOLHDL 3 06/09/2016  He is on Atorvastatin. - last eye exam was in  03/2016. No DR. Will have a new one soon. - + numbness and tingling in his feet.  He also has HL, HTN, depression. He has a history of anabolic steroid use for muscle building.  ROS: Constitutional: no weight gain/loss, no fatigue, no subjective hyperthermia/hypothermia Eyes: + blurry vision, no xerophthalmia ENT: no sore throat, no nodules palpated in throat, no dysphagia/odynophagia, no hoarseness Cardiovascular: no CP/SOB/palpitations/leg swelling Respiratory: no cough/SOB Gastrointestinal: no N/V/D/C Musculoskeletal: no muscle/joint aches Skin: no rashes Neurological: no tremors/numbness/tingling/dizziness  I reviewed pt's medications, allergies, PMH, social hx, family hx, and changes were documented in the history of present illness. Otherwise, unchanged from my initial visit note.   Past Medical History:  Diagnosis Date  . Allergy   . Arthritis   . CAD (coronary artery disease)    s/p bifurcation stenting (DES x2) and stenting bifurcation lesion distal RCA (1 DES)  . Depression   . Diabetes mellitus   . Hyperlipemia   . Hypertension   . Sleep apnea    CPAP   Past Surgical History:  Procedure Laterality Date  . CORONARY ANGIOPLASTY WITH STENT PLACEMENT   March 2010   Dr. Olevia Perches  . TONSILLECTOMY  1959   Social History   Social History  .  Marital Status: Married    Spouse Name: N/A  . Number of Children: 2   Occupational History  . 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 Smoking status: Former Smoker    Types: Cigarettes    Quit date: 11/30/1983  . Smokeless tobacco: Never Used  . Alcohol Use:  beer, 3-4 times a week, 6-8 drinks at the time       . Drug Use: No   Social History Narrative   No living will   Would want wife, then son [name], as health care POA   Would accept resuscitation   Not sure about tube feeds   Current Outpatient Prescriptions on File Prior to Visit  Medication Sig  Dispense Refill  . aspirin 81 MG tablet Take 81 mg by mouth daily.      . clopidogrel (PLAVIX) 75 MG tablet TAKE ONE TABLET BY MOUTH ONCE DAILY 90 tablet 3  . Exenatide ER (BYDUREON) 2 MG PEN INJECT ONE PEN WEEKLY UNDER THE SKIN (Patient not taking: Reported on 01/18/2017) 12 each 3  . glipiZIDE (GLUCOTROL) 10 MG tablet TAKE 1 TABLET BY MOUTH TWICE A DAY BEFORE A MEAL. 60 tablet 11  . glucose blood (ONE TOUCH ULTRA TEST) test strip Use to test blood sugar 2 times daily as instructed. Dx: E11.59, E11.65 100 each 5  . Insulin Glargine (LANTUS SOLOSTAR) 100 UNIT/ML Solostar Pen Inject 34 Units into the skin daily at 10 pm. 5 pen 5  . Insulin Pen Needle 32G X 4 MM MISC Use as advised, once a day 100 each 11  . losartan (COZAAR) 100 MG tablet TAKE ONE TABLET BY MOUTH ONCE DAILY 90 tablet 3  . metFORMIN (GLUCOPHAGE) 1000 MG tablet TAKE ONE TABLET BY MOUTH TWICE DAILY WITH MEALS 180 tablet 1  . metoprolol succinate (TOPROL-XL) 25 MG 24 hr tablet TAKE ONE TABLET BY MOUTH ONCE DAILY 90 tablet 3  . naproxen sodium (ALEVE) 220 MG tablet Take 220-440 mg by mouth 2 (two) times daily as needed (pain).     Glory Rosebush DELICA LANCETS FINE MISC Use to test blood sugar 2 times daily as instructed. Dx: E11.59, E11.65 100 each 5  . PARoxetine (PAXIL) 20 MG tablet TAKE TWO TABLETS BY MOUTH ONCE DAILY FOR DEPRESSION 60 tablet 11  . rosuvastatin (CRESTOR) 10 MG tablet TAKE 1 TABLET BY MOUTH DAILY 90 tablet 0  . sildenafil (REVATIO) 20 MG tablet Take 60-100 mg by mouth daily as needed (for ED).     [name] triamcinolone cream (KENALOG) 0.1 % APPLY  CREAM EXTERNALLY TO AFFECTED AREA TWICE DAILY 45 g 1   No current facility-administered medications on file prior to visit.    Allergies  Allergen Reactions  . Venlafaxine     Slowed his urine stream   Family History  Problem Relation Age of Onset  . Heart failure Father     Deceased 1 y/o  . Diabetes Father   . Cancer Father     Prostate  . Heart failure Mother      Deceased 83 y/o  . Alcohol abuse Mother   . Schizophrenia Son     Paranoid  . Uveitis Son   . Cancer Brother     prostate  . Colon cancer Neg Hx   . Esophageal cancer Neg Hx   . Stomach cancer Neg Hx    PE: BP 138/88 (BP Location: Left Arm, Patient  Position: Sitting)   Pulse 62   Ht 5\' 9"  (1.753 m)   Wt 196 lb (88.9 kg)   SpO2 98%   BMI 28.94 kg/m  Body mass index is 28.94 kg/m. Wt Readings from Last 3 Encounters:  02/18/17 196 lb (88.9 kg)  01/18/17 194 lb (88 kg)  11/02/16 198 lb (89.8 kg)   Constitutional: Slightly overweight, in NAD Eyes: PERRLA, EOMI, no exophthalmos ENT: moist mucous membranes, no thyromegaly, no cervical lymphadenopathy Cardiovascular: RRR, No MRG Respiratory: CTA B Gastrointestinal: abdomen soft, NT, ND, BS+ Musculoskeletal: no deformities, strength intact in all 4 Skin: moist, warm, no rashes Neurological: no tremor with outstretched hands, DTR normal in all 4  ASSESSMENT: 1. DM2, insulin-dependent, uncontrolled, with complications - CAD - s/p 4 stents 2013 - seeing Dr Burt Knack - PN  Component     Latest Ref Rng 06/09/2016  C-Peptide     0.80-3.85 ng/mL 1.78  Glucose, Fasting     65 - 99 mg/dL 184 (H)  Glutamic Acid Decarb Ab     <5 IU/mL <5  Pancreatic Islet Cell Antibody     < 5 JDF Units <5  Slight insulin deficiency, but no clear type 1 diabetes.  PLAN:  1. Patient with long-standing, uncontrolled diabetes, on oral antidiabetic regimen + basal insulin + now back on the GLP1 R agonist (Bydureon). He had to come off Bydureon last year 2/2 price, but restarted it 3 weeks ago. Sugars are improving, but still has spikes and his sugars in am may be high especially when he falls asleep and forgets Lantus >> will increase the dose of Lantus and move it after dinner. - I suggested to:  Patient Instructions  Please continue: - Metformin 1000 mg 2x a day, with meals - Glipizide 10 mg 2x a day, before meals - Bydureon 2 mg weekly  Please  increase: - Lantus to 36 units (may need to increase to 38 units) and move this after dinner.  Please return in 3 months with your sugar log.   - advised for yearly eye exams >> he is UTD, ut will have one soon as he has blurry vision - he is UTD with flu shot - will check HbA1c now >> 8.3% (higher!) - Return to clinic in 3 mo with sugar log   Philemon Kingdom, MD PhD [facility] Endocrinology

## 2017-02-18 NOTE — Patient Instructions (Addendum)
Please continue: - Metformin 1000 mg 2x a day, with meals - Glipizide 10 mg 2x a day, before meals - Bydureon 2 mg weekly  Please increase: - Lantus to 36 units (may need to increase to 38 units) and move this after dinner.  Please return in 3 months with your sugar log.

## 2017-03-14 ENCOUNTER — Other Ambulatory Visit: Payer: Self-pay | Admitting: Internal Medicine

## 2017-03-15 NOTE — Telephone Encounter (Signed)
Approved: okay to fill for a year 

## 2017-03-15 NOTE — Telephone Encounter (Signed)
Last Rx prescribed by Dr Silvio Pate, but pt now sees endo. pls advise

## 2017-05-02 DIAGNOSIS — H40053 Ocular hypertension, bilateral: Secondary | ICD-10-CM | POA: Diagnosis not present

## 2017-05-02 DIAGNOSIS — H35033 Hypertensive retinopathy, bilateral: Secondary | ICD-10-CM | POA: Diagnosis not present

## 2017-05-02 DIAGNOSIS — I10 Essential (primary) hypertension: Secondary | ICD-10-CM | POA: Diagnosis not present

## 2017-05-02 DIAGNOSIS — E119 Type 2 diabetes mellitus without complications: Secondary | ICD-10-CM | POA: Diagnosis not present

## 2017-05-02 LAB — HM DIABETES EYE EXAM

## 2017-05-12 ENCOUNTER — Encounter: Payer: Self-pay | Admitting: Internal Medicine

## 2017-05-19 ENCOUNTER — Other Ambulatory Visit: Payer: Self-pay | Admitting: Internal Medicine

## 2017-06-11 ENCOUNTER — Encounter: Payer: Self-pay | Admitting: Internal Medicine

## 2017-06-13 MED ORDER — TRIAMCINOLONE ACETONIDE 0.1 % EX LOTN: 1.0000 "application " | TOPICAL_LOTION | Freq: Two times a day (BID) | CUTANEOUS | 3 refills | Status: DC | PRN

## 2017-06-14 ENCOUNTER — Other Ambulatory Visit: Payer: Self-pay

## 2017-06-14 ENCOUNTER — Ambulatory Visit (INDEPENDENT_AMBULATORY_CARE_PROVIDER_SITE_OTHER): Payer: Medicare Other | Admitting: Internal Medicine

## 2017-06-14 ENCOUNTER — Encounter: Payer: Self-pay | Admitting: Internal Medicine

## 2017-06-14 VITALS — BP 122/74 | HR 87 | Ht 68.0 in | Wt 189.0 lb

## 2017-06-14 DIAGNOSIS — E1165 Type 2 diabetes mellitus with hyperglycemia: Secondary | ICD-10-CM

## 2017-06-14 DIAGNOSIS — E1159 Type 2 diabetes mellitus with other circulatory complications: Secondary | ICD-10-CM

## 2017-06-14 LAB — POCT GLYCOSYLATED HEMOGLOBIN (HGB A1C): Hemoglobin A1C: 6

## 2017-06-14 MED ORDER — TRIAMCINOLONE ACETONIDE 0.1 % EX CREA: TOPICAL_CREAM | CUTANEOUS | 1 refills | Status: DC

## 2017-06-14 NOTE — Patient Instructions (Addendum)
Please continue: - Metformin 1000 mg 2x a day, with meals - Glipizide 10 mg 2x a day, before meals - Lantus 36 units after dinner  Restart Bydureon 2 mg weekly in August.  Please return in 3 months with your sugar log.

## 2017-06-14 NOTE — Progress Notes (Signed)
Patient ID: David [name], male   DOB: 1950/12/05, 66 y.o.   MRN: [id number]   HPI: David [name] is a 66 y.o.-year-old male, returning for f/u for DM2, dx in ~2008, insulin-dependent since 09/2015, uncontrolled, with complications (CAD - s/p stents in 2013, PN). Last visit  4 months ago.  Last hemoglobin A1c was: Lab Results  Component Value Date   HGBA1C 8.3 02/18/2017   HGBA1C 6.9 11/02/2016   HGBA1C 6.3 07/27/2016  03/05/2016: HbA1c 9.1%  Pt is on a regimen of: - Lantus 22 units at bedtime >> 34 units at bedtime >> 36 units after dinner - He still forgets this approximately once a week. He was also off the medication for a week at the time as he was trying to see how he does without it especially due to price - Metformin 1000 mg 2x a day, with meals  - Glipizide 10 mg 2x a day He was on Bydureon 2 mg weekly - started 01/30/2017 >> stopped 04/2017 b/c price He had to stop Bydureon in 2017 b/c in donut hole.  Pt checks his sugars 2-3x a day: - am: 108-274 (!) >> 150-250, 315 (forgot Lantus) >> 116-185, 208, 248  - 2h after b'fast: n/c  - before lunch: 57, 137-165 >> 96-180 >> 108-202, 262 >> 78-185 - 2h after lunch: n/c - before dinner: 84-193, 235 >> 117-151, 193 >> 87-166 - 2h after dinner: n/c - bedtime:  92-169 >> 131, 182-246, 359 >> 134-341 >> 142-239. - nighttime: n/c No lows. Lowest sugar was 154 >> 94 >> 72 >> 57x1 >> 84 >> 108; ? hypoglycemia awareness Highest sugar was 240 >> 366 >> 360 >> 310 >> 200s >> 359 >> 422 (off Lantus) >> 248  Glucometer: One Touch  Pt's meals are: - Breakfast: protein shake - Lunch: n/a - Dinner: meat + veggies /potatoes or salad - Snacks: no No desserts. Diet sodas, ice tea with splenda.   - No CKD, last BUN/creatinine:  Lab Results  Component Value Date   BUN 13 06/09/2016   CREATININE 0.97 06/09/2016  He is on losartan. - last set of lipids: Lab Results  Component Value Date   CHOL 145 06/09/2016   HDL 46.40 06/09/2016    LDLCALC 75 06/09/2016   LDLDIRECT 103.9 11/05/2013   TRIG 118.0 06/09/2016   CHOLHDL 3 06/09/2016  Is on atorvastatin. - last eye exam was in 04/2017 >> No DR - He does have numbness and tingling in his feet.  He also has HL, HTN, depression. He has a history of anabolic steroid use for muscle building.  ROS: Constitutional: + weight loss, no fatigue, no subjective hyperthermia, no subjective hypothermia Eyes: no blurry vision, no xerophthalmia ENT: no sore throat, no nodules palpated in throat, no dysphagia, no odynophagia, no hoarseness Cardiovascular: no CP/no SOB/no palpitations/no leg swelling Respiratory: no cough/no SOB/no wheezing Gastrointestinal: no N/no V/no D/no C/no acid reflux Musculoskeletal: no muscle aches/no joint aches Skin: no rashes, no hair loss Neurological: no tremors/no dizziness  I reviewed pt's medications, allergies, PMH, social hx, family hx, and changes were documented in the history of present illness. Otherwise, unchanged from my initial visit note.   Past Medical History:  Diagnosis Date  . Allergy   . Arthritis   . CAD (coronary artery disease)    s/p bifurcation stenting (DES x2) and stenting bifurcation lesion distal RCA (1 DES)  . Depression   . Diabetes mellitus   . Hyperlipemia   . Hypertension   .  Sleep apnea    CPAP   Past Surgical History:  Procedure Laterality Date  . CORONARY ANGIOPLASTY WITH STENT PLACEMENT   March 2010   Dr. Olevia Perches  . TONSILLECTOMY  1959   Social History   Social History  . Marital Status: Married    Spouse Name: N/A  . Number of Children: 2   Occupational History  . 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 Smoking status: Former Smoker    Types: Cigarettes    Quit date: 11/30/1983  . Smokeless tobacco: Never Used  . Alcohol Use:  beer, 3-4 times a week, 6-8 drinks at the time       . Drug Use: No   Social History Narrative   No  living will   Would want wife, then son David [name], as health care POA   Would accept resuscitation   Not sure about tube feeds   Current Outpatient Prescriptions on File Prior to Visit  Medication Sig Dispense Refill  . aspirin 81 MG tablet Take 81 mg by mouth daily.      . clopidogrel (PLAVIX) 75 MG tablet TAKE ONE TABLET BY MOUTH ONCE DAILY 90 tablet 3  . Exenatide ER (BYDUREON) 2 MG PEN INJECT ONE PEN WEEKLY UNDER THE SKIN 12 each 3  . glipiZIDE (GLUCOTROL) 10 MG tablet TAKE 1 TABLET BY MOUTH TWICE A DAY BEFORE A MEAL. 60 tablet 11  . glucose blood (ONE TOUCH ULTRA TEST) test strip Use to test blood sugar 2 times daily as instructed. Dx: E11.59, E11.65 100 each 5  . Insulin Glargine (LANTUS SOLOSTAR) 100 UNIT/ML Solostar Pen Inject 34 Units into the skin daily at 10 pm. 5 pen 5  . Insulin Pen Needle 32G X 4 MM MISC Use as advised, once a day 100 each 11  . losartan (COZAAR) 100 MG tablet TAKE ONE TABLET BY MOUTH ONCE DAILY 90 tablet 3  . metFORMIN (GLUCOPHAGE) 1000 MG tablet TAKE ONE TABLET BY MOUTH TWICE DAILY WITH MEALS 180 tablet 3  . metoprolol succinate (TOPROL-XL) 25 MG 24 hr tablet TAKE ONE TABLET BY MOUTH ONCE DAILY 90 tablet 3  . naproxen sodium (ALEVE) 220 MG tablet Take 220-440 mg by mouth 2 (two) times daily as needed (pain).     David [name] DELICA LANCETS FINE MISC Use to test blood sugar 2 times daily as instructed. Dx: E11.59, E11.65 100 each 5  . PARoxetine (PAXIL) 20 MG tablet TAKE TWO TABLETS BY MOUTH ONCE DAILY FOR DEPRESSION 60 tablet 11  . rosuvastatin (CRESTOR) 10 MG tablet TAKE 1 TABLET BY MOUTH DAILY 90 tablet 0  . sildenafil (REVATIO) 20 MG tablet Take 60-100 mg by mouth daily as needed (for ED).      No current facility-administered medications on file prior to visit.    Allergies  Allergen Reactions  . Venlafaxine     Slowed his urine stream   Family History  Problem Relation Age of Onset  . Heart failure Father        Deceased 38 y/o  . Diabetes Father   .  Cancer Father        Prostate  . Heart failure Mother        Deceased 16 y/o  . Alcohol abuse Mother   . Schizophrenia Son        Paranoid  . Uveitis Son   . Cancer Brother  prostate  . Colon cancer Neg Hx   . Esophageal cancer Neg Hx   . Stomach cancer Neg Hx    PE: BP 122/74 (BP Location: Left Arm, Patient Position: Sitting)   Pulse 87   Ht 5\' 8"  (1.727 m)   Wt 189 lb (85.7 kg)   SpO2 97%   BMI 28.74 kg/m   Body mass index is 28.74 kg/m. Wt Readings from Last 3 Encounters:  06/14/17 189 lb (85.7 kg)  02/18/17 196 lb (88.9 kg)  01/18/17 194 lb (88 kg)   Constitutional: slightly overweight, in NAD Eyes: PERRLA, EOMI, no exophthalmos ENT: moist mucous membranes, no thyromegaly, no cervical lymphadenopathy Cardiovascular: RRR, No MRG Respiratory: CTA B Gastrointestinal: abdomen soft, NT, ND, BS+ Musculoskeletal: no deformities, strength intact in all 4 Skin: moist, warm, no rashes Neurological: no tremor with outstretched hands, DTR normal in all 4  ASSESSMENT: 1. DM2, insulin-dependent, uncontrolled, with complications - CAD - s/p 4 stents 2013 - seeing Dr Burt Knack - PN  Component     Latest Ref Rng 06/09/2016  C-Peptide     0.80-3.85 ng/mL 1.78  Glucose, Fasting     65 - 99 mg/dL 184 (H)  Glutamic Acid Decarb Ab     <5 IU/mL <5  Pancreatic Islet Cell Antibody     < 5 JDF Units <5  Slight insulin deficiency, but no clear type 1 diabetes.  PLAN:  1. Patient with long-standing,Uncontrolled diabetes, on oral antidiabetic regimen and basal insulin. He had to stop the Bydureon 1 month ago due to price. He is planning to restart this in August. His sugars have improved on the above regimen, but are still quite variable. He does not have any more extreme blood sugars, though.  - Therefore, will continue the current regimen  and I advised him to try not to forget the Lantus at night. Will restart the Bydureon in 2 weeks.  - I suggested to:  Patient Instructions   Please continue: - Metformin 1000 mg 2x a day, with meals - Glipizide 10 mg 2x a day, before meals - Lantus 36 units after dinner  Restart Bydureon 2 mg weekly in August.  Please return in 3 months with your sugar log.   - today, HbA1c is 6.0% (much better) - continue checking sugars at different times of the day - check 3x a day, rotating checks - advised for yearly eye exams >> he is UTD - Return to clinic in 3 mo with sugar log    Philemon Kingdom, MD PhD [facility] Endocrinology

## 2017-06-14 NOTE — Addendum Note (Signed)
Addended by: Caprice Beaver T on: 06/14/2017 10:26 AM   Modules accepted: Orders

## 2017-07-12 DIAGNOSIS — H02839 Dermatochalasis of unspecified eye, unspecified eyelid: Secondary | ICD-10-CM | POA: Diagnosis not present

## 2017-07-12 DIAGNOSIS — H18413 Arcus senilis, bilateral: Secondary | ICD-10-CM | POA: Diagnosis not present

## 2017-07-12 DIAGNOSIS — H25043 Posterior subcapsular polar age-related cataract, bilateral: Secondary | ICD-10-CM | POA: Diagnosis not present

## 2017-07-12 DIAGNOSIS — H2513 Age-related nuclear cataract, bilateral: Secondary | ICD-10-CM | POA: Diagnosis not present

## 2017-07-13 ENCOUNTER — Telehealth: Payer: Self-pay

## 2017-07-13 NOTE — Telephone Encounter (Signed)
Clearance request received from [facility] [place] for: Cataract extraction with intraocular lens implantation of the R eye followed by the L eye under topical anesthesia. Date of procedure: 08/29/2017  Clearance to be faxed to: [phone] attn: Sharyn Lull

## 2017-07-17 NOTE — Telephone Encounter (Signed)
Pt last seen in Oct 2017. Has been maintained on long-term DAPT with ASA and plavix. Typically can continue these medications for cataract surgery. OK to proceed at low cardiac risk.

## 2017-07-18 NOTE — Telephone Encounter (Signed)
Faxed to [facility] [place] at [phone].

## 2017-07-26 ENCOUNTER — Other Ambulatory Visit: Payer: Self-pay | Admitting: Internal Medicine

## 2017-07-26 DIAGNOSIS — E1165 Type 2 diabetes mellitus with hyperglycemia: Principal | ICD-10-CM

## 2017-07-26 DIAGNOSIS — E1159 Type 2 diabetes mellitus with other circulatory complications: Secondary | ICD-10-CM

## 2017-08-10 ENCOUNTER — Other Ambulatory Visit: Payer: Self-pay | Admitting: Cardiovascular Disease

## 2017-08-10 ENCOUNTER — Other Ambulatory Visit: Payer: Self-pay | Admitting: Internal Medicine

## 2017-08-11 NOTE — Telephone Encounter (Signed)
Please call office and schedule appointment for further refills. 

## 2017-08-28 ENCOUNTER — Other Ambulatory Visit: Payer: Self-pay | Admitting: Internal Medicine

## 2017-08-29 DIAGNOSIS — H2511 Age-related nuclear cataract, right eye: Secondary | ICD-10-CM | POA: Diagnosis not present

## 2017-08-29 DIAGNOSIS — Z961 Presence of intraocular lens: Secondary | ICD-10-CM | POA: Diagnosis not present

## 2017-08-30 ENCOUNTER — Encounter: Payer: Medicare Other | Admitting: Internal Medicine

## 2017-08-30 DIAGNOSIS — H2512 Age-related nuclear cataract, left eye: Secondary | ICD-10-CM | POA: Diagnosis not present

## 2017-09-02 ENCOUNTER — Ambulatory Visit (INDEPENDENT_AMBULATORY_CARE_PROVIDER_SITE_OTHER): Payer: Medicare Other

## 2017-09-02 DIAGNOSIS — Z23 Encounter for immunization: Secondary | ICD-10-CM

## 2017-09-12 ENCOUNTER — Other Ambulatory Visit: Payer: Self-pay | Admitting: Internal Medicine

## 2017-09-13 ENCOUNTER — Encounter: Payer: Self-pay | Admitting: Internal Medicine

## 2017-09-13 ENCOUNTER — Ambulatory Visit (INDEPENDENT_AMBULATORY_CARE_PROVIDER_SITE_OTHER): Payer: Medicare Other | Admitting: Internal Medicine

## 2017-09-13 VITALS — BP 134/80 | HR 68 | Wt 185.0 lb

## 2017-09-13 DIAGNOSIS — E1159 Type 2 diabetes mellitus with other circulatory complications: Secondary | ICD-10-CM | POA: Diagnosis not present

## 2017-09-13 DIAGNOSIS — E1165 Type 2 diabetes mellitus with hyperglycemia: Secondary | ICD-10-CM | POA: Diagnosis not present

## 2017-09-13 DIAGNOSIS — E663 Overweight: Secondary | ICD-10-CM | POA: Insufficient documentation

## 2017-09-13 LAB — COMPLETE METABOLIC PANEL WITH GFR
AG RATIO: 1.3 (calc) (ref 1.0–2.5)
ALKALINE PHOSPHATASE (APISO): 68 U/L (ref 40–115)
ALT: 36 U/L (ref 9–46)
AST: 49 U/L — AB (ref 10–35)
Albumin: 4 g/dL (ref 3.6–5.1)
BUN: 17 mg/dL (ref 7–25)
CO2: 24 mmol/L (ref 20–32)
Calcium: 8.8 mg/dL (ref 8.6–10.3)
Chloride: 103 mmol/L (ref 98–110)
Creat: 0.99 mg/dL (ref 0.70–1.25)
GFR, Est African American: 90 mL/min/{1.73_m2} (ref >=60)
GFR, Est Non African American: 78 mL/min/{1.73_m2} (ref >=60)
GLOBULIN: 3 g/dL (ref 1.9–3.7)
Glucose, Bld: 175 mg/dL — ABNORMAL HIGH (ref 65–99)
POTASSIUM: 4.5 mmol/L (ref 3.5–5.3)
SODIUM: 136 mmol/L (ref 135–146)
Total Bilirubin: 0.4 mg/dL (ref 0.2–1.2)
Total Protein: 7 g/dL (ref 6.1–8.1)

## 2017-09-13 LAB — LIPID PANEL
CHOLESTEROL: 129 mg/dL (ref 0–200)
HDL: 40.7 mg/dL (ref >39.00)
LDL Cholesterol: 66 mg/dL (ref 0–99)
NonHDL: 88.56
TRIGLYCERIDES: 112 mg/dL (ref 0.0–149.0)
Total CHOL/HDL Ratio: 3
VLDL: 22.4 mg/dL (ref 0.0–40.0)

## 2017-09-13 LAB — POCT GLYCOSYLATED HEMOGLOBIN (HGB A1C): Hemoglobin A1C: 6.6

## 2017-09-13 MED ORDER — INSULIN GLARGINE 100 UNIT/ML SOLOSTAR PEN: PEN_INJECTOR | SUBCUTANEOUS | 5 refills | Status: DC

## 2017-09-13 NOTE — Progress Notes (Addendum)
Patient ID: David [name], male   DOB: 05-Jul-1951, 66 y.o.   MRN: [id number]   HPI: David [name] is a 66 y.o.-year-old male, returning for f/u for DM2, dx in ~2008, insulin-dependent since 09/2015, uncontrolled, with complications (CAD - s/p stents in 2013, PN). Last visit  3 mo ago.  Last hemoglobin A1c was: Lab Results  Component Value Date   HGBA1C 6.0 06/14/2017   HGBA1C 8.3 02/18/2017   HGBA1C 6.9 11/02/2016  03/05/2016: HbA1c 9.1%  Pt is on a regimen of: - Lantus 22 units at bedtime >> 34 units at bedtime >> 36 units after dinner - He still forgets this approximately once a week. - Metformin 1000 mg 2x a day, with meals  - Glipizide 10 mg 2x a day He was on Bydureon 2 mg weekly - started 01/30/2017 >> stopped 04/2017 b/c price - could not restart He had to stop Bydureon in 2017 b/c in donut hole.  Pt checks his sugars 2-3x a day: - am: 150-250, 315 (forgot Lantus) >> 116-185, 208, 248 >> 78, 123-182, 194, off Lantus 200-250 - 2h after b'fast: n/c  - before lunch:108-202, 262 >> 78-185 >> 65, 70, 112-180, 214, 226 - 2h after lunch: n/c - before dinner: 117-151, 193 >> 87-166 >> 122-176, 207, 216 - 2h after dinner: n/c - bedtime:  131, 182-246, 359 >> 134-341 >> 142-239 >> 170-230, 260 - nighttime: n/c Lowest sugar was 108 >> 65; ? hypoglycemia awareness Highest sugar was 422 (off Lantus) >> 248 >> 260  Glucometer: One Touch  Pt's meals are: - Breakfast: protein shake - Lunch: n/a - Dinner: meat + veggies /potatoes or salad - Snacks: no No desserts. Diet sodas, ice tea with splenda.   - No CKD, last BUN/creatinine:  Lab Results  Component Value Date   BUN 13 06/09/2016   CREATININE 0.97 06/09/2016  On Losartan. - last set of lipids: Lab Results  Component Value Date   CHOL 145 06/09/2016   HDL 46.40 06/09/2016   LDLCALC 75 06/09/2016   LDLDIRECT 103.9 11/05/2013   TRIG 118.0 06/09/2016   CHOLHDL 3 06/09/2016  On crestor 10. - last eye exam was in 04/2017 >>  No DR - he does have numbness and tingling in his feet.  He also has HL, HTN, depression. He has a history of anabolic steroid use for muscle building.  ROS: Constitutional: no weight gain/no weight loss, no fatigue, no subjective hyperthermia, no subjective hypothermia Eyes: no blurry vision, no xerophthalmia ENT: no sore throat, no nodules palpated in throat, no dysphagia, no odynophagia, no hoarseness Cardiovascular: no CP/no SOB/no palpitations/no leg swelling Respiratory: no cough/no SOB/no wheezing Gastrointestinal: no N/no V/no D/no C/no acid reflux Musculoskeletal: no muscle aches/no joint aches Skin: no rashes, no hair loss Neurological: no tremors/+ numbness/+ tingling/no dizziness  I reviewed pt's medications, allergies, PMH, social hx, family hx, and changes were documented in the history of present illness. Otherwise, unchanged from my initial visit note.   Past Medical History:  Diagnosis Date  . Allergy   . Arthritis   . CAD (coronary artery disease)    s/p bifurcation stenting (DES x2) and stenting bifurcation lesion distal RCA (1 DES)  . Depression   . Diabetes mellitus   . Hyperlipemia   . Hypertension   . Sleep apnea    CPAP   Past Surgical History:  Procedure Laterality Date  . CORONARY ANGIOPLASTY WITH STENT PLACEMENT   March 2010   Dr. Olevia Perches  . TONSILLECTOMY  1959  Social History   Social History  . Marital Status: Married    Spouse Name: N/A  . Number of Children: 2   Occupational History  . 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 Smoking status: Former Smoker    Types: Cigarettes    Quit date: 11/30/1983  . Smokeless tobacco: Never Used  . Alcohol Use:  beer, 3-4 times a week, 6-8 drinks at the time       . Drug Use: No   Social History Narrative   No living will   Would want wife, then son [name], as health care POA   Would accept resuscitation   Not sure about tube feeds    Current Outpatient Prescriptions on File Prior to Visit  Medication Sig Dispense Refill  . aspirin 81 MG tablet Take 81 mg by mouth daily.      . clopidogrel (PLAVIX) 75 MG tablet TAKE ONE TABLET BY MOUTH ONCE DAILY 90 tablet 3  . Exenatide ER (BYDUREON) 2 MG PEN INJECT ONE PEN WEEKLY UNDER THE SKIN 12 each 3  . glipiZIDE (GLUCOTROL) 10 MG tablet TAKE 1 TABLET BY MOUTH TWICE A DAY BEFORE A MEAL. 60 tablet 5  . glucose blood (ONE TOUCH ULTRA TEST) test strip Use to test blood sugar 2 times daily as instructed. Dx: E11.59, E11.65 100 each 5  . Insulin Pen Needle 32G X 4 MM MISC Use as advised, once a day 100 each 11  . LANTUS SOLOSTAR 100 UNIT/ML Solostar Pen INJECT 34 UNITS SUBCUTANEOUSLY ONCE DAILY AT  10PM 15 pen 5  . losartan (COZAAR) 100 MG tablet TAKE ONE TABLET BY MOUTH ONCE DAILY 90 tablet 0  . metFORMIN (GLUCOPHAGE) 1000 MG tablet TAKE ONE TABLET BY MOUTH TWICE DAILY WITH MEALS 180 tablet 3  . metoprolol succinate (TOPROL-XL) 25 MG 24 hr tablet TAKE ONE TABLET BY MOUTH ONCE DAILY 90 tablet 3  . naproxen sodium (ALEVE) 220 MG tablet Take 220-440 mg by mouth 2 (two) times daily as needed (pain).     Glory Rosebush DELICA LANCETS FINE MISC Use to test blood sugar 2 times daily as instructed. Dx: E11.59, E11.65 100 each 5  . PARoxetine (PAXIL) 20 MG tablet TAKE TWO TABLETS BY MOUTH ONCE DAILY FOR DEPRESSION 60 tablet 11  . rosuvastatin (CRESTOR) 10 MG tablet TAKE 1 TABLET BY MOUTH DAILY 90 tablet 0  . sildenafil (REVATIO) 20 MG tablet Take 60-100 mg by mouth daily as needed (for ED).     [name] triamcinolone cream (KENALOG) 0.1 % APPLY  CREAM EXTERNALLY TO AFFECTED AREA TWICE DAILY for scalp. 45 g 1   No current facility-administered medications on file prior to visit.    Allergies  Allergen Reactions  . Venlafaxine     Slowed his urine stream   Family History  Problem Relation Age of Onset  . Heart failure Father        Deceased 54 y/o  . Diabetes Father   . Cancer Father         Prostate  . Heart failure Mother        Deceased 48 y/o  . Alcohol abuse Mother   . Schizophrenia Son        Paranoid  . Uveitis Son   . Cancer Brother        prostate  . Colon cancer Neg Hx   . Esophageal cancer Neg Hx   .  Stomach cancer Neg Hx    PE: BP 134/80 (BP Location: Left Arm, Patient Position: Sitting)   Pulse 68   Wt 185 lb (83.9 kg)   SpO2 98%   BMI 28.13 kg/m  Body mass index is 28.13 kg/m. Wt Readings from Last 3 Encounters:  09/13/17 185 lb (83.9 kg)  06/14/17 189 lb (85.7 kg)  02/18/17 196 lb (88.9 kg)   Constitutional: + slightly overweight, in NAD Eyes: PERRLA, EOMI, no exophthalmos ENT: moist mucous membranes, no thyromegaly, no cervical lymphadenopathy Cardiovascular: RRR, No MRG Respiratory: CTA B Gastrointestinal: abdomen soft, NT, ND, BS+ Musculoskeletal: no deformities, strength intact in all 4 Skin: moist, warm, no rashes Neurological: no tremor with outstretched hands, DTR normal in all 4  ASSESSMENT: 1. DM2, insulin-dependent, uncontrolled, with complications - CAD - s/p 4 stents 2013 - seeing Dr Burt Knack - PN  Component     Latest Ref Rng 06/09/2016  C-Peptide     0.80-3.85 ng/mL 1.78  Glucose, Fasting     65 - 99 mg/dL 184 (H)  Glutamic Acid Decarb Ab     <5 IU/mL <5  Pancreatic Islet Cell Antibody     < 5 JDF Units <5  Slight insulin deficiency, but no clear type 1 diabetes.  2. Overweight  PLAN:  1. Patient with long-standing, Uncontrolled diabetes, on oral antidiabetic regimen, Lantus, and previously on Bydureon which worked really well for him, however, he cannot afford this any more. He is in the doughnut hole and may be able to afford this again next year. We did discuss about trying to check with his insurance to see if other GLP-1 receptor agonists are covered. I gave him a list for these.  - He was off Lantus for a period of time, despite advice in the past not to stop this since sugars greatly increase off insulin. His  sugars increased and it took almost 2 weeks for them to improve. Again strongly advised him not to stop insulin anymore - For now, we discussed about the possibility of changing Lantus to NPH (he also complains that Lantus is expensive), but after I explained how NPH is administered and how it works, he would like to remain on Lantus for now. We also discussed about the possibility of using 70/30 insulin, but he refuses for now. - Since his sugars are still high on Lantus and I would not like to increase his insulin dose due to the possibility of lows, we discussed about improving his diet. He is also drinking beer, which is planning to reduce. - I suggested to:  Patient Instructions  Please continue: - Metformin 1000 mg 2x a day, with meals - Glipizide 10 mg 2x a day, before meals - Lantus 36 units after dinner  Please check with the pharmacy or your insurance if the following medicines are covered: - Trulicity (once a week) - Bydureon (once a week) - Ozempic (once a week) - Victoza (once a day)  Please return in 3 months with your sugar log.   - today, HbA1c is 6.6% (higher, but still at goal) - continue checking sugars at different times of the day - check 1-2x a day, rotating checks - advised for yearly eye exams >> he is UTD - Return to clinic in 3 mo with sugar log    2. Overweight - weight improving - lost 11 lbs in last 6 mo.  Office Visit on 09/13/2017  Component Date Value Ref Range Status  . Glucose, Bld 09/13/2017 175*  65 - 99 mg/dL Final   Comment: .            Fasting reference interval . For someone without known diabetes, a glucose value >125 mg/dL indicates that they may have diabetes and this should be confirmed with a follow-up test. .   . BUN 09/13/2017 17  7 - 25 mg/dL Final  . Creat 09/13/2017 0.99  0.70 - 1.25 mg/dL Final   Comment: For patients >75 years of age, the reference limit for Creatinine is approximately 13% higher for people identified as  African-American. .   . GFR, Est Non African American 09/13/2017 78  > OR = 60 mL/min/1.77m2 Final  . GFR, Est African American 09/13/2017 90  > OR = 60 mL/min/1.73m2 Final  . BUN/Creatinine Ratio 56/21/3086 NOT APPLICABLE  6 - 22 (calc) Final  . Sodium 09/13/2017 136  135 - 146 mmol/L Final  . Potassium 09/13/2017 4.5  3.5 - 5.3 mmol/L Final  . Chloride 09/13/2017 103  98 - 110 mmol/L Final  . CO2 09/13/2017 24  20 - 32 mmol/L Final  . Calcium 09/13/2017 8.8  8.6 - 10.3 mg/dL Final  . Total Protein 09/13/2017 7.0  6.1 - 8.1 g/dL Final  . Albumin 09/13/2017 4.0  3.6 - 5.1 g/dL Final  . Globulin 09/13/2017 3.0  1.9 - 3.7 g/dL (calc) Final  . AG Ratio 09/13/2017 1.3  1.0 - 2.5 (calc) Final  . Total Bilirubin 09/13/2017 0.4  0.2 - 1.2 mg/dL Final  . Alkaline phosphatase (APISO) 09/13/2017 68  40 - 115 U/L Final  . AST 09/13/2017 49* 10 - 35 U/L Final  . ALT 09/13/2017 36  9 - 46 U/L Final  . Cholesterol 09/13/2017 129  0 - 200 mg/dL Final   ATP III Classification       Desirable:  < 200 mg/dL               Borderline High:  200 - 239 mg/dL          High:  > = 240 mg/dL  . Triglycerides 09/13/2017 112.0  0.0 - 149.0 mg/dL Final   Normal:  <150 mg/dLBorderline High:  150 - 199 mg/dL  . HDL 09/13/2017 40.70  >39.00 mg/dL Final  . VLDL 09/13/2017 22.4  0.0 - 40.0 mg/dL Final  . LDL Cholesterol 09/13/2017 66  0 - 99 mg/dL Final  . Total CHOL/HDL Ratio 09/13/2017 3   Final                  Men          Women1/2 Average Risk     3.4          3.3Average Risk          5.0          4.42X Average Risk          9.6          7.13X Average Risk          15.0          11.0                      . NonHDL 09/13/2017 88.56   Final   NOTE:  Non-HDL goal should be 30 mg/dL higher than patient's LDL goal (i.e. LDL goal of < 70 mg/dL, would have non-HDL goal of < 100 mg/dL)  . Hemoglobin A1C 09/13/2017 6.6   Final   Labs have  all improved (except Glu and HbA1c).  Philemon Kingdom, MD PhD [facility]  Endocrinology

## 2017-09-13 NOTE — Patient Instructions (Addendum)
Please continue: - Metformin 1000 mg 2x a day, with meals - Glipizide 10 mg 2x a day, before meals - Lantus 36 units after dinner  Please check with the pharmacy or your insurance if the following medicines are covered: - Trulicity (once a week) - Bydureon (once a week) - Ozempic (once a week) - Victoza (once a day)  Please return in 3 months with your sugar log.

## 2017-09-19 DIAGNOSIS — H2512 Age-related nuclear cataract, left eye: Secondary | ICD-10-CM | POA: Diagnosis not present

## 2017-09-19 DIAGNOSIS — Z961 Presence of intraocular lens: Secondary | ICD-10-CM | POA: Diagnosis not present

## 2017-09-27 ENCOUNTER — Other Ambulatory Visit: Payer: Self-pay | Admitting: Internal Medicine

## 2017-10-03 ENCOUNTER — Other Ambulatory Visit: Payer: Self-pay | Admitting: Cardiovascular Disease

## 2017-11-03 ENCOUNTER — Other Ambulatory Visit: Payer: Self-pay | Admitting: Internal Medicine

## 2017-11-10 ENCOUNTER — Encounter: Payer: Self-pay | Admitting: Cardiovascular Disease

## 2017-11-10 ENCOUNTER — Ambulatory Visit: Payer: Medicare Other | Admitting: Cardiovascular Disease

## 2017-11-10 VITALS — BP 140/86 | HR 65 | Ht 69.0 in | Wt 187.1 lb

## 2017-11-10 DIAGNOSIS — E785 Hyperlipidemia, unspecified: Secondary | ICD-10-CM

## 2017-11-10 DIAGNOSIS — I2510 Atherosclerotic heart disease of native coronary artery without angina pectoris: Secondary | ICD-10-CM | POA: Diagnosis not present

## 2017-11-10 DIAGNOSIS — I10 Essential (primary) hypertension: Secondary | ICD-10-CM | POA: Diagnosis not present

## 2017-11-10 NOTE — Progress Notes (Signed)
Cardiology Office Note Date:  11/10/2017   ID:  David [name], DOB 1951-03-19, MRN [id number]  PCP:  [name], MD  Cardiologist:  Sherren Mocha, MD    Chief Complaint  Patient presents with  . Follow-up    CAD     History of Present Illness: David [name] is a 66 y.o. male who presents for follow-up of coronary artery disease.  The patient underwent complex PCI in 2010 after presenting with anginal symptoms.  The left circumflex/obtuse marginal bifurcation was treated and the distal RCA bifurcation was treated in separate settings.  He has been maintained on long-term dual antiplatelet therapy with aspirin and clopidogrel.  The patient is here alone today.  He has had no chest pain or pressure since his last visit 1 year ago.  He continues to do well.  He does complain of very easy bruising.  He shows me a picture of a very large left thigh bruise that occurred after a minor bump.  He is also had fairly extensive bruising on his arms.  Denies major bleeding.  No shortness of breath, orthopnea, or PND.  He wakes up at night with rapid heartbeats intermittently.  Symptoms resolve after a few minutes and he goes back to sleep.  This is not occurred at any other times.  He is not interested in pursuing outpatient monitoring at this point.   Past Medical History:  Diagnosis Date  . Allergy   . Arthritis   . CAD (coronary artery disease)    s/p bifurcation stenting (DES x2) and stenting bifurcation lesion distal RCA (1 DES)  . Depression   . Diabetes mellitus   . Hyperlipemia   . Hypertension   . Sleep apnea    CPAP    Past Surgical History:  Procedure Laterality Date  . CORONARY ANGIOPLASTY WITH STENT PLACEMENT   March 2010   Dr. Olevia Perches  . TONSILLECTOMY  1959    Current Outpatient Medications  Medication Sig Dispense Refill  . aspirin 81 MG tablet Take 81 mg by mouth daily.      [name] glipiZIDE (GLUCOTROL) 10 MG tablet TAKE 1 TABLET BY MOUTH TWICE A DAY BEFORE A MEAL. 60  tablet 5  . GLOBAL EASE INJECT PEN NEEDLES 32G X 4 MM MISC USE AS DIRECTED WITH BASAGLAR 100 each 5  . glucose blood (ONE TOUCH ULTRA TEST) test strip Use to test blood sugar 2 times daily as instructed. Dx: E11.59, E11.65 100 each 5  . Insulin Glargine (LANTUS SOLOSTAR) 100 UNIT/ML Solostar Pen INJECT 36 UNITS SUBCUTANEOUSLY ONCE DAILY AT  10PM 15 pen 5  . losartan (COZAAR) 100 MG tablet TAKE ONE TABLET BY MOUTH ONCE DAILY 90 tablet 0  . metFORMIN (GLUCOPHAGE) 1000 MG tablet TAKE ONE TABLET BY MOUTH TWICE DAILY WITH MEALS 180 tablet 3  . metoprolol succinate (TOPROL-XL) 25 MG 24 hr tablet TAKE ONE TABLET BY MOUTH ONCE DAILY 90 tablet 0  . naproxen sodium (ALEVE) 220 MG tablet Take 220-440 mg by mouth 2 (two) times daily as needed (pain).     Glory Rosebush DELICA LANCETS FINE MISC Use to test blood sugar 2 times daily as instructed. Dx: E11.59, E11.65 100 each 5  . PARoxetine (PAXIL) 20 MG tablet TAKE TWO TABLETS BY MOUTH ONCE DAILY FOR DEPRESSION 60 tablet 11  . rosuvastatin (CRESTOR) 10 MG tablet TAKE 1 TABLET BY MOUTH DAILY 90 tablet 0  . sildenafil (REVATIO) 20 MG tablet Take 60-100 mg by mouth daily as needed (for  ED).     . triamcinolone cream (KENALOG) 0.1 % APPLY TO AFFECTED AREA TWICE A DAY FOR SCALP 45 g 1   No current facility-administered medications for this visit.     Allergies:   Venlafaxine   Social History:  The patient  reports that he quit smoking about 33 years ago. His smoking use included cigarettes. he has never used smokeless tobacco. He reports that he drinks about 2.5 oz of alcohol per week. He reports that he does not use drugs.   Family History:  The patient's family history includes Alcohol abuse in his mother; Cancer in his brother and father; Diabetes in his father; Heart failure in his father and mother; Schizophrenia in his son; Uveitis in his son.    ROS:  Please see the history of present illness.  Otherwise, review of systems is positive for back pain, muscle  pain, easy bruising, difficulty urinating.  All other systems are reviewed and negative.    PHYSICAL EXAM: VS:  BP 140/86   Pulse 65   Ht 5\' 9"  (1.753 m)   Wt 187 lb 1.9 oz (84.9 kg)   BMI 27.63 kg/m  , BMI Body mass index is 27.63 kg/m. GEN: Well nourished, well developed, in no acute distress  HEENT: normal  Neck: no JVD, no masses. No carotid bruits Cardiac: RRR without murmur or gallop                Respiratory:  clear to auscultation bilaterally, normal work of breathing GI: soft, nontender, nondistended, + BS MS: no deformity or atrophy  Ext: no pretibial edema, pedal pulses 2+= bilaterally Skin: warm and dry, no rash Neuro:  Strength and sensation are intact Psych: euthymic mood, full affect  EKG:  EKG is ordered today. The ekg ordered today shows NSR 65 bpm, within normal limits  Recent Labs: 09/13/2017: ALT 36; BUN 17; Creat 0.99; Potassium 4.5; Sodium 136   Lipid Panel     Component Value Date/Time   CHOL 129 09/13/2017 1040   TRIG 112.0 09/13/2017 1040   HDL 40.70 09/13/2017 1040   CHOLHDL 3 09/13/2017 1040   VLDL 22.4 09/13/2017 1040   LDLCALC 66 09/13/2017 1040   LDLDIRECT 103.9 11/05/2013 1249      Wt Readings from Last 3 Encounters:  11/10/17 187 lb 1.9 oz (84.9 kg)  09/13/17 185 lb (83.9 kg)  06/14/17 189 lb (85.7 kg)    ASSESSMENT AND PLAN: 1.  Coronary artery disease, native vessel, without angina: Reviewed the patient's medications.  We discussed pros and cons of long-term dual antiplatelet therapy.  Considering the degree of problems he is having with easy bruising and the fact that he is now 8 years out from PCI, it seems reasonable to discontinue clopidogrel.  We will continue aspirin 81 mg daily without interruption.  No other medication changes are made today.  2.  Hypertension: Blood pressure is controlled on losartan and metoprolol succinate.  3.  Hyperlipidemia: Lipids at goal on low-dose Crestor.  He does have some myalgias and  complains of discomfort in his thighs.  No major change in symptoms.  Discussed pros and cons of continuing statin therapy.  As long as tolerable I advised him to continue on low-dose Crestor.  4.  Type 2 diabetes: Glycemic control is relatively stable with most recent hemoglobin A1c 6.9.  Followed closely by Dr Silvio Pate and Dr Cruzita Lederer.  Current medicines are reviewed with the patient today.  The patient does not have concerns regarding  medicines.  Labs/ tests ordered today include:   Orders Placed This Encounter  Procedures  . EKG 12-Lead    Disposition:   FU one year  Signed, Sherren Mocha, MD  11/10/2017 5:36 PM    [address] [place], [place], [place]  [address] Phone: [phone]; Fax: [phone]

## 2017-11-10 NOTE — Patient Instructions (Signed)
Medication Instructions:  1) STOP PLAVIX  Labwork: None  Testing/Procedures: None  Follow-Up: Your provider wants you to follow-up in: 1 year with Dr. Cooper. You will receive a reminder letter in the mail two months in advance. If you don't receive a letter, please call our office to schedule the follow-up appointment.    Any Other Special Instructions Will Be Listed Below (If Applicable).     If you need a refill on your cardiac medications before your next appointment, please call your pharmacy.   

## 2017-11-14 ENCOUNTER — Ambulatory Visit (INDEPENDENT_AMBULATORY_CARE_PROVIDER_SITE_OTHER): Payer: Medicare Other | Admitting: Internal Medicine

## 2017-11-14 ENCOUNTER — Encounter: Payer: Self-pay | Admitting: Internal Medicine

## 2017-11-14 ENCOUNTER — Encounter: Payer: Self-pay | Admitting: Gastroenterology

## 2017-11-14 VITALS — BP 136/76 | HR 82 | Temp 98.1°F | Ht 68.5 in | Wt 185.0 lb

## 2017-11-14 DIAGNOSIS — Z0000 Encounter for general adult medical examination without abnormal findings: Secondary | ICD-10-CM

## 2017-11-14 DIAGNOSIS — Z7189 Other specified counseling: Secondary | ICD-10-CM | POA: Diagnosis not present

## 2017-11-14 DIAGNOSIS — D696 Thrombocytopenia, unspecified: Secondary | ICD-10-CM | POA: Diagnosis not present

## 2017-11-14 DIAGNOSIS — I10 Essential (primary) hypertension: Secondary | ICD-10-CM | POA: Diagnosis not present

## 2017-11-14 DIAGNOSIS — F330 Major depressive disorder, recurrent, mild: Secondary | ICD-10-CM

## 2017-11-14 DIAGNOSIS — E1140 Type 2 diabetes mellitus with diabetic neuropathy, unspecified: Secondary | ICD-10-CM

## 2017-11-14 DIAGNOSIS — D369 Benign neoplasm, unspecified site: Secondary | ICD-10-CM

## 2017-11-14 NOTE — Assessment & Plan Note (Signed)
BP Readings from Last 3 Encounters:  11/14/17 136/76  11/10/17 140/86  09/13/17 134/80   Good control

## 2017-11-14 NOTE — Progress Notes (Signed)
Hearing Screening   125Hz  250Hz  500Hz  1000Hz  2000Hz  3000Hz  4000Hz  6000Hz  8000Hz   Right ear:   40 40 0  0    Left ear:   40 40 0  0    Vision Screening Comments: Completed at [facility] may 2018

## 2017-11-14 NOTE — Progress Notes (Signed)
Subjective:    Patient ID: David [name], male    DOB: 1951/11/24, 66 y.o.   MRN: [id number]  HPI Here for Medicare wellness visit and follow up of chronic health conditions Reviewed form and advanced directives Reviewed other doctors-- Dr Burt Knack (cardio), Dr Cruzita Lederer (endo), Dr Rick Duff Had cataracts removed this year Did fall in driveway this year--- just slipped. Scrapes and other minor injuries No tobacco Usually has 3-4 beers--but more at times. No DWI, withdrawal, no missed work Vision is good now Mild problems with hearing-- is considering hearing aides Chronic mood issues Independent with instrumental ADLs No sig memory issues  Recent visit with Dr Burt Knack plavix stopped--8 years since stents Does have easy bleeding and bruising--hopefully will improve No chest pain or SOB Still working--physically active at work, and wears him out No dizziness or syncope  Mood problems persist Gets down at times--may last a couple of days No suicidal ideation Not anhedonic  No vacations--finances  Checks sugars bid Last A1c 6.6% Some numbness in feet and tingling--mostly in bed in AM Mostly not in day No sig pain Keeps up with eye exams  Takes aleve--- 2 every AM Ongoing hip and leg pain Occasionally skips it if not going to work Discussed trying acetaminophen at least some of the time  Current Outpatient Medications on File Prior to Visit  Medication Sig Dispense Refill  . aspirin 81 MG tablet Take 81 mg by mouth daily.      [name] glipiZIDE (GLUCOTROL) 10 MG tablet TAKE 1 TABLET BY MOUTH TWICE A DAY BEFORE A MEAL. 60 tablet 5  . GLOBAL EASE INJECT PEN NEEDLES 32G X 4 MM MISC USE AS DIRECTED WITH BASAGLAR 100 each 5  . glucose blood (ONE TOUCH ULTRA TEST) test strip Use to test blood sugar 2 times daily as instructed. Dx: E11.59, E11.65 100 each 5  . Insulin Glargine (LANTUS SOLOSTAR) 100 UNIT/ML Solostar Pen INJECT 36 UNITS SUBCUTANEOUSLY ONCE DAILY AT  10PM 15 pen 5  .  losartan (COZAAR) 100 MG tablet TAKE ONE TABLET BY MOUTH ONCE DAILY 90 tablet 0  . metFORMIN (GLUCOPHAGE) 1000 MG tablet TAKE ONE TABLET BY MOUTH TWICE DAILY WITH MEALS 180 tablet 3  . metoprolol succinate (TOPROL-XL) 25 MG 24 hr tablet TAKE ONE TABLET BY MOUTH ONCE DAILY 90 tablet 0  . naproxen sodium (ALEVE) 220 MG tablet Take 220-440 mg by mouth 2 (two) times daily as needed (pain).     Glory Rosebush DELICA LANCETS FINE MISC Use to test blood sugar 2 times daily as instructed. Dx: E11.59, E11.65 100 each 5  . PARoxetine (PAXIL) 20 MG tablet TAKE TWO TABLETS BY MOUTH ONCE DAILY FOR DEPRESSION 60 tablet 11  . rosuvastatin (CRESTOR) 10 MG tablet TAKE 1 TABLET BY MOUTH DAILY 90 tablet 0  . sildenafil (REVATIO) 20 MG tablet Take 60-100 mg by mouth daily as needed (for ED).     [name] triamcinolone cream (KENALOG) 0.1 % APPLY TO AFFECTED AREA TWICE A DAY FOR SCALP 45 g 1   No current facility-administered medications on file prior to visit.     Allergies  Allergen Reactions  . Venlafaxine     Slowed his urine stream    Past Medical History:  Diagnosis Date  . Allergy   . Arthritis   . CAD (coronary artery disease)    s/p bifurcation stenting (DES x2) and stenting bifurcation lesion distal RCA (1 DES)  . Depression   . Diabetes mellitus   . Hyperlipemia   .  Hypertension   . Sleep apnea    CPAP    Past Surgical History:  Procedure Laterality Date  . CORONARY ANGIOPLASTY WITH STENT PLACEMENT   March 2010   Dr. Olevia Perches  . TONSILLECTOMY  1959    Family History  Problem Relation Age of Onset  . Heart failure Father        Deceased 59 y/o  . Diabetes Father   . Cancer Father        Prostate  . Heart failure Mother        Deceased 68 y/o  . Alcohol abuse Mother   . Schizophrenia Son        Paranoid  . Uveitis Son   . Cancer Brother        prostate  . Colon cancer Neg Hx   . Esophageal cancer Neg Hx   . Stomach cancer Neg Hx     Social History   Socioeconomic History  .  Marital status: Married    Spouse name: Not on file  . Number of children: Not on file  . Years of education: Not on file  . Highest education level: Not on file  Social Needs  . Financial resource strain: Not on file  . Food insecurity - worry: Not on file  . Food insecurity - inability: Not on file  . Transportation needs - medical: Not on file  . Transportation needs - non-medical: Not on file  Occupational History  . Occupation: Patent examiner buildings    Comment: site closed  . Occupation: Engineering geologist  Tobacco Use  . Smoking status: Former Smoker    Types: Cigarettes    Last attempt to quit: 11/30/1983    Years since quitting: 33.9  . Smokeless tobacco: Never Used  Substance and Sexual Activity  . Alcohol use: Yes    Alcohol/week: 2.5 oz    Types: 5 Standard drinks or equivalent per week  . Drug use: No  . Sexual activity: Not on file  Other Topics Concern  . Not on file  Social History Narrative   No living will   Would want wife, then son [name], as health care POA   Would accept resuscitation   Not sure about tube feeds   Review of Systems Appetite is good Weight down slightly Generally sleeps okay--other than with leg pain Frequent nocturia--- 3-4 per night. No daytime symptoms Wears seat belt Teeth are okay---keeps up with dentist Bowels are fine. No blood Ongoing psoriasis--- uses cream. No dermatologist Occasional heartburn---uses OTC meds (?2x/week)    Objective:   Physical Exam  Constitutional: He is oriented to person, place, and time. He appears well-developed. No distress.  HENT:  Mouth/Throat: Oropharynx is clear and moist. No oropharyngeal exudate.  Neck: No thyromegaly present.  Cardiovascular: Normal rate, regular rhythm, normal heart sounds and intact distal pulses. Exam reveals no gallop.  No murmur heard. Pulmonary/Chest: Effort normal and breath sounds normal. No respiratory distress. He has no wheezes. He has no rales.    Abdominal: Soft. He exhibits no distension. There is no tenderness.  Musculoskeletal: He exhibits no edema or tenderness.  Lymphadenopathy:    He has no cervical adenopathy.  Neurological: He is alert and oriented to person, place, and time.  President-- "Daisy Floro, Abbe [place]" [id number] Recall 3/3  Sensation normal in feet  Skin: No rash noted. No erythema.  No foot lesions  Psychiatric: He has a normal mood and affect. His behavior is normal.  Assessment & Plan:

## 2017-11-14 NOTE — Assessment & Plan Note (Signed)
Will recheck with next blood draw Easy bruising may improve off the plavix

## 2017-11-14 NOTE — Assessment & Plan Note (Signed)
I have personally reviewed the Medicare Annual Wellness questionnaire and have noted 1. The patient's medical and social history 2. Their use of alcohol, tobacco or illicit drugs 3. Their current medications and supplements 4. The patient's functional ability including ADL's, fall risks, home safety risks and hearing or visual             impairment. 5. Diet and physical activities 6. Evidence for depression or mood disorders  The patients weight, height, BMI and visual acuity have been recorded in the chart I have made referrals, counseling and provided education to the patient based review of the above and I have provided the pt with a written personalized care plan for preventive services.  I have provided you with a copy of your personalized plan for preventive services. Please take the time to review along with your updated medication list.  Discussed shingrix--will consider Overdue for colon--will refer Yearly flu vaccine Defer PSA to at least next year Discussed exercise

## 2017-11-14 NOTE — Assessment & Plan Note (Signed)
See social history 

## 2017-11-14 NOTE — Assessment & Plan Note (Signed)
Still with good control Very mild neuropathy

## 2017-11-14 NOTE — Assessment & Plan Note (Signed)
Ongoing symptoms but not severe Will continue paroxetine indefinitely

## 2017-12-13 ENCOUNTER — Encounter: Payer: Self-pay | Admitting: Internal Medicine

## 2017-12-13 ENCOUNTER — Ambulatory Visit: Payer: Medicare Other | Admitting: Internal Medicine

## 2017-12-13 VITALS — BP 130/70 | HR 64 | Ht 68.5 in | Wt 182.4 lb

## 2017-12-13 DIAGNOSIS — E1159 Type 2 diabetes mellitus with other circulatory complications: Secondary | ICD-10-CM

## 2017-12-13 DIAGNOSIS — E785 Hyperlipidemia, unspecified: Secondary | ICD-10-CM | POA: Diagnosis not present

## 2017-12-13 DIAGNOSIS — E1165 Type 2 diabetes mellitus with hyperglycemia: Secondary | ICD-10-CM

## 2017-12-13 LAB — POCT GLYCOSYLATED HEMOGLOBIN (HGB A1C): Hemoglobin A1C: 7.3

## 2017-12-13 NOTE — Progress Notes (Signed)
Patient ID: David [name], male   DOB: [birth year], 67 y.o.   MRN: [id number]   HPI: David [name] is a 67 y.o.-year-old male, returning for f/u for DM2, dx in ~2008, insulin-dependent since 09/2015, uncontrolled, with complications (CAD - s/p stents in 2013, PN). Last visit 3 months ago.  Last hemoglobin A1c was: Lab Results  Component Value Date   HGBA1C 6.6 09/13/2017   HGBA1C 6.0 06/14/2017   HGBA1C 8.3 02/18/2017  03/05/2016: HbA1c 9.1%  Pt is on a regimen of: - Lantus 22 units at bedtime >> 34 units at bedtime >> 36 units after dinner - ran out 3 weeks ago - Metformin 1000 mg 2x a day, with meals - Glipizide 10 mg 2x a day, before meals He was on Bydureon 2 mg weekly - started 01/30/2017 >> stopped 04/2017 b/c price - could not restart He had to stop Bydureon in 2017 b/c in donut hole.  Pt checks his sugars 2-3x a day: - am: 78, 123-182, 194, off Lantus 200-250 >> off Lantus 158, 197-287 - 2h after b'fast: n/c  - before lunch:78-185 >> 65, 70, 112-180, 214, 226 >> 125-195, 238 - 2h after lunch: n/c - before dinner: 87-166 >> 122-176, 207, 216 >> 175-235 - 2h after dinner: n/c - bedtime:  134-341 >> 142-239 >> 170-230, 260 >> 94-220 - nighttime: n/c Lowest sugar was 108 >> 65 >> 65 x1; unclear at what level he has hypoglycemia awareness Highest sugar was 422 (off Lantus) >> 248 >> 260 >> 287  Glucometer: One Touch  Pt's meals are: - Breakfast: protein shake - Lunch: n/a - Dinner: meat + veggies /potatoes or salad - Snacks: no No desserts. Diet sodas, ice tea with splenda.   - No CKD, last BUN/creatinine:  Lab Results  Component Value Date   BUN 17 09/13/2017   CREATININE 0.99 09/13/2017  He was on losartan >> stopped by Dr. Burt Knack. -+ HL; last set of lipids: Lab Results  Component Value Date   CHOL 129 09/13/2017   HDL 40.70 09/13/2017   LDLCALC 66 09/13/2017   LDLDIRECT 103.9 11/05/2013   TRIG 112.0 09/13/2017   CHOLHDL 3 09/13/2017  On Crestor 10. - last  eye exam was in  04/2017: No DR - He does have numbness and tingling in his feet.  He also has HTN, depression. He has a history of anabolic steroid use for muscle building.  ROS: Constitutional: no weight gain/no weight loss, no fatigue, no subjective hyperthermia, no subjective hypothermia Eyes: no blurry vision, no xerophthalmia ENT: no sore throat, no nodules palpated in throat, no dysphagia, no odynophagia, no hoarseness Cardiovascular: no CP/no SOB/no palpitations/no leg swelling Respiratory: no cough/no SOB/no wheezing Gastrointestinal: no N/no V/no D/no C/no acid reflux Musculoskeletal: no muscle aches/no joint aches Skin: no rashes, no hair loss Neurological: no tremors/+ numbness/+ tingling/no dizziness  I reviewed pt's medications, allergies, PMH, social hx, family hx, and changes were documented in the history of present illness. Otherwise, unchanged from my initial visit note.   Past Medical History:  Diagnosis Date  . Allergy   . Arthritis   . CAD (coronary artery disease)    s/p bifurcation stenting (DES x2) and stenting bifurcation lesion distal RCA (1 DES)  . Depression   . Diabetes mellitus   . Hyperlipemia   . Hypertension   . Sleep apnea    CPAP   Past Surgical History:  Procedure Laterality Date  . CORONARY ANGIOPLASTY WITH STENT PLACEMENT   March 2010  Dr. Olevia Perches  . TONSILLECTOMY  1959   Social History   Social History  . Marital Status: Married    Spouse Name: N/A  . Number of Children: 2   Occupational History  . 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 Smoking status: Former Smoker    Types: Cigarettes    Quit date: 11/30/1983  . Smokeless tobacco: Never Used  . Alcohol Use:  beer, 3-4 times a week, 6-8 drinks at the time       . Drug Use: No   Social History Narrative   No living will   Would want wife, then son [name], as health care POA   Would accept resuscitation   Not  sure about tube feeds   Current Outpatient Medications on File Prior to Visit  Medication Sig Dispense Refill  . aspirin 81 MG tablet Take 81 mg by mouth daily.      [name] glipiZIDE (GLUCOTROL) 10 MG tablet TAKE 1 TABLET BY MOUTH TWICE A DAY BEFORE A MEAL. 60 tablet 5  . GLOBAL EASE INJECT PEN NEEDLES 32G X 4 MM MISC USE AS DIRECTED WITH BASAGLAR 100 each 5  . glucose blood (ONE TOUCH ULTRA TEST) test strip Use to test blood sugar 2 times daily as instructed. Dx: E11.59, E11.65 100 each 5  . Insulin Glargine (LANTUS SOLOSTAR) 100 UNIT/ML Solostar Pen INJECT 36 UNITS SUBCUTANEOUSLY ONCE DAILY AT  10PM 15 pen 5  . losartan (COZAAR) 100 MG tablet TAKE ONE TABLET BY MOUTH ONCE DAILY 90 tablet 0  . metFORMIN (GLUCOPHAGE) 1000 MG tablet TAKE ONE TABLET BY MOUTH TWICE DAILY WITH MEALS 180 tablet 3  . metoprolol succinate (TOPROL-XL) 25 MG 24 hr tablet TAKE ONE TABLET BY MOUTH ONCE DAILY 90 tablet 0  . naproxen sodium (ALEVE) 220 MG tablet Take 220-440 mg by mouth 2 (two) times daily as needed (pain).     Glory Rosebush DELICA LANCETS FINE MISC Use to test blood sugar 2 times daily as instructed. Dx: E11.59, E11.65 100 each 5  . PARoxetine (PAXIL) 20 MG tablet TAKE TWO TABLETS BY MOUTH ONCE DAILY FOR DEPRESSION 60 tablet 11  . rosuvastatin (CRESTOR) 10 MG tablet TAKE 1 TABLET BY MOUTH DAILY 90 tablet 0  . sildenafil (REVATIO) 20 MG tablet Take 60-100 mg by mouth daily as needed (for ED).     [name] triamcinolone cream (KENALOG) 0.1 % APPLY TO AFFECTED AREA TWICE A DAY FOR SCALP 45 g 1   No current facility-administered medications on file prior to visit.    Allergies  Allergen Reactions  . Venlafaxine     Slowed his urine stream   Family History  Problem Relation Age of Onset  . Heart failure Father        Deceased 75 y/o  . Diabetes Father   . Cancer Father        Prostate  . Heart failure Mother        Deceased 22 y/o  . Alcohol abuse Mother   . Schizophrenia Son        Paranoid  . Uveitis Son    . Cancer Brother        prostate  . Colon cancer Neg Hx   . Esophageal cancer Neg Hx   . Stomach cancer Neg Hx    PE: Ht 5' 8.5" (1.74 m)   Wt 182 lb 6.4 oz (82.7 kg)  BMI 27.33 kg/m  Body mass index is 27.33 kg/m. Wt Readings from Last 3 Encounters:  12/13/17 182 lb 6.4 oz (82.7 kg)  11/14/17 185 lb (83.9 kg)  11/10/17 187 lb 1.9 oz (84.9 kg)   Constitutional: overweight, in NAD Eyes: PERRLA, EOMI, no exophthalmos ENT: moist mucous membranes, no thyromegaly, no cervical lymphadenopathy Cardiovascular: RRR, No MRG Respiratory: CTA B Gastrointestinal: abdomen soft, NT, ND, BS+ Musculoskeletal: no deformities, strength intact in all 4 Skin: moist, warm, no rashes Neurological: no tremor with outstretched hands, DTR normal in all 4  ASSESSMENT: 1. DM2, insulin-dependent, uncontrolled, with complications - CAD - s/p 4 stents 2013 - seeing Dr Burt Knack - PN  Component     Latest Ref Rng 06/09/2016  C-Peptide     0.80-3.85 ng/mL 1.78  Glucose, Fasting     65 - 99 mg/dL 184 (H)  Glutamic Acid Decarb Ab     <5 IU/mL <5  Pancreatic Islet Cell Antibody     < 5 JDF Units <5  Slight insulin deficiency, but no clear type 1 diabetes.  2. Overweight  PLAN:  1. Patient with long-standing, uncontrolled, diabetes, on oral antidiabetic regimen and basal insulin.  Previously on Bydureon, which worked really well for him, but not covered by insurance anymore.  He was in the donut hole at last visit but now he started a new insurance year.  We did discuss at last visit to check with his insurance whether other GLP-1 receptor agonist are covered.  I gave him a list of these. - Before last visit, he was off Lantus and sugars greatly worsen at that time and they were very difficult to normalize afterwards. At this visit, he is again off Lantus for 3 weeks >> sugars higher.  I strongly advised him not to come off the insulin anymore. I gave him a sample pack of Toujeo and we discussed about NPH  >> he would like to stay off this for now and will try to get Lantus again at the beginning of next mo - sugars are high after he stopped Lantus, especially in am - before he ran out of Lantus >> sugars were variable, but closer to target - I suggested to:  Patient Instructions  Please continue: - Metformin 1000 mg 2x a day, with meals - Glipizide 10 mg 2x a day, before meals  Restart: - Lantus/Toujeo 36 units after dinner  Please return in 3 months with your sugar log.   - today, HbA1c is 7.3% (higher) - continue checking sugars at different times of the day - check 1-2x a day, rotating checks - advised for yearly eye exams >> he is UTD - Return to clinic in 3 mo with sugar log     2. HL -Reviewed together last lipid panel from 08/2017: LDL improved -He continues on Crestor 10 - no side effects-   Philemon Kingdom, MD PhD [facility]

## 2017-12-13 NOTE — Patient Instructions (Addendum)
Please continue: - Metformin 1000 mg 2x a day, with meals - Glipizide 10 mg 2x a day, before meals  Restart: - Lantus/Toujeo 36 units after dinner  Please return in 3 months with your sugar log.

## 2017-12-13 NOTE — Addendum Note (Signed)
Addended by: Drucilla Schmidt on: 12/13/2017 11:39 AM   Modules accepted: Orders

## 2017-12-15 ENCOUNTER — Other Ambulatory Visit: Payer: Self-pay | Admitting: Internal Medicine

## 2017-12-26 ENCOUNTER — Encounter: Payer: Self-pay | Admitting: Gastroenterology

## 2017-12-26 ENCOUNTER — Ambulatory Visit (AMBULATORY_SURGERY_CENTER): Payer: Self-pay

## 2017-12-26 ENCOUNTER — Other Ambulatory Visit: Payer: Self-pay

## 2017-12-26 VITALS — Ht 69.0 in | Wt 187.6 lb

## 2017-12-26 DIAGNOSIS — Z86010 Personal history of colonic polyps: Secondary | ICD-10-CM

## 2017-12-26 MED ORDER — PEG 3350-KCL-NA BICARB-NACL 420 G PO SOLR: 4000.0000 mL | Freq: Once | ORAL | 0 refills | Status: AC

## 2017-12-26 NOTE — Progress Notes (Signed)
Denies allergies to eggs or soy products. Denies complication of anesthesia or sedation. Denies use of weight loss medication. Denies use of O2.   Emmi instructions declined.  

## 2018-01-04 ENCOUNTER — Other Ambulatory Visit: Payer: Self-pay | Admitting: Cardiovascular Disease

## 2018-01-09 ENCOUNTER — Ambulatory Visit (AMBULATORY_SURGERY_CENTER): Payer: Medicare Other | Admitting: Gastroenterology

## 2018-01-09 ENCOUNTER — Other Ambulatory Visit: Payer: Self-pay

## 2018-01-09 ENCOUNTER — Encounter: Payer: Self-pay | Admitting: Gastroenterology

## 2018-01-09 VITALS — BP 120/74 | HR 75 | Temp 97.5°F | Resp 22 | Ht 69.0 in | Wt 187.0 lb

## 2018-01-09 DIAGNOSIS — Z86010 Personal history of colonic polyps: Secondary | ICD-10-CM | POA: Diagnosis not present

## 2018-01-09 DIAGNOSIS — Z538 Procedure and treatment not carried out for other reasons: Secondary | ICD-10-CM | POA: Diagnosis not present

## 2018-01-09 MED ORDER — SODIUM CHLORIDE 0.9 % IV SOLN: 500.0000 mL | Freq: Once | INTRAVENOUS | Status: DC

## 2018-01-09 NOTE — Patient Instructions (Signed)
YOU HAD AN ENDOSCOPIC PROCEDURE TODAY AT THE [facility]:   Refer to the procedure report that was given to you for any specific questions about what was found during the examination.  If the procedure report does not answer your questions, please call your gastroenterologist to clarify.  If you requested that your care partner not be given the details of your procedure findings, then the procedure report has been included in a sealed envelope for you to review at your convenience later.  YOU SHOULD EXPECT: Some feelings of bloating in the abdomen. Passage of more gas than usual.  Walking can help get rid of the air that was put into your GI tract during the procedure and reduce the bloating. If you had a lower endoscopy (such as a colonoscopy or flexible sigmoidoscopy) you may notice spotting of blood in your stool or on the toilet paper. If you underwent a bowel prep for your procedure, you may not have a normal bowel movement for a few days.  Please Note:  You might notice some irritation and congestion in your nose or some drainage.  This is from the oxygen used during your procedure.  There is no need for concern and it should clear up in a day or so.  SYMPTOMS TO REPORT IMMEDIATELY:   Following lower endoscopy (colonoscopy or flexible sigmoidoscopy):  Excessive amounts of blood in the stool  Significant tenderness or worsening of abdominal pains  Swelling of the abdomen that is new, acute  Fever of 100F or higher  For urgent or emergent issues, a gastroenterologist can be reached at any hour by calling [phone].   DIET:  We do recommend a small meal at first, but then you may proceed to your regular diet.  Drink plenty of fluids but you should avoid alcoholic beverages for 24 hours.  ACTIVITY:  You should plan to take it easy for the rest of today and you should NOT DRIVE or use heavy machinery until tomorrow (because of the sedation medicines used during the test).     FOLLOW UP: Our staff will call the number listed on your records the next business day following your procedure to check on you and address any questions or concerns that you may have regarding the information given to you following your procedure. If we do not reach you, we will leave a message.  However, if you are feeling well and you are not experiencing any problems, there is no need to return our call.  We will assume that you have returned to your regular daily activities without incident.  If any biopsies were taken you will be contacted by phone or by letter within the next 1-3 weeks.  Please call us at [phone] if you have not heard about the biopsies in 3 weeks.    SIGNATURES/CONFIDENTIALITY: You and/or your care partner have signed paperwork which will be entered into your electronic medical record.  These signatures attest to the fact that that the information above on your After Visit Summary has been reviewed and is understood.  Full responsibility of the confidentiality of this discharge information lies with you and/or your care-partner. 

## 2018-01-09 NOTE — Op Note (Signed)
[facility] Patient Name: David [name] Procedure Date: 01/09/2018 9:25 AM MRN: [id number] Endoscopist: Milus Banister , MD Age: 67 Referring MD:  Date of Birth: [birth year] Gender: Male Account #: [id number] Procedure:                Colonoscopy Indications:              High risk colon cancer surveillance: Personal                            history of colonic polypsl colonoscoy 2012 (three                            subCM polyps, two were adenomas) Medicines:                Monitored Anesthesia Care Procedure:                Pre-Anesthesia Assessment:                           - Prior to the procedure, a History and Physical                            was performed, and patient medications and                            allergies were reviewed. The patient's tolerance of                            previous anesthesia was also reviewed. The risks                            and benefits of the procedure and the sedation                            options and risks were discussed with the patient.                            All questions were answered, and informed consent                            was obtained. Prior Anticoagulants: The patient has                            taken no previous anticoagulant or antiplatelet                            agents. ASA Grade Assessment: II - A patient with                            mild systemic disease. After reviewing the risks                            and benefits, the patient was deemed in  satisfactory condition to undergo the procedure.                           After obtaining informed consent, the colonoscope                            was passed under direct vision. Throughout the                            procedure, the patient's blood pressure, pulse, and                            oxygen saturations were monitored continuously. The                            Colonoscope was introduced  through the anus and                            advanced to the the cecum, identified IC valve only                            (too much stool in cecum to identify the appendicea                            orifice). The colonoscopy was performed without                            difficulty. The patient tolerated the procedure                            well. The quality of the bowel preparation was                            poor. The ileocecal valve and the rectum were                            photographed. Scope In: 9:29:08 AM Scope Out: 9:42:11 AM Scope Withdrawal Time: 0 hours 6 minutes 8 seconds  Total Procedure Duration: 0 hours 13 minutes 3 seconds  Findings:                 Semi-solid stool was found in the entire colon,                            precluding visualization.                           No obstructing lesions. Complications:            No immediate complications. Estimated blood loss:                            None. Estimated Blood Loss:     Estimated blood loss: none. Impression:               - There were no obstructing lesions however  small                            but significant lesions could have been missed due                            to poor prep. Recommendation:           - Patient has a contact number available for                            emergencies. The signs and symptoms of potential                            delayed complications were discussed with the                            patient. Return to normal activities tomorrow.                            Written discharge instructions were provided to the                            patient.                           - Resume previous diet.                           - Continue present medications.                           - Repeat colonoscopy at the next available                            appointment for surveillance with "double prep                            protocol" Milus Banister,  MD 01/09/2018 9:46:57 AM This report has been signed electronically.

## 2018-01-09 NOTE — Progress Notes (Signed)
Report given to PACU, vss 

## 2018-01-10 ENCOUNTER — Telehealth: Payer: Self-pay | Admitting: *Deleted

## 2018-01-10 NOTE — Telephone Encounter (Signed)
  Follow up Call-  Call back number 01/09/2018  Post procedure Call Back phone  # [phone]  Permission to leave phone message Yes  Some recent data might be hidden     Patient questions:  Do you have a fever, pain , or abdominal swelling? No. Pain Score  0 *  Have you tolerated food without any problems? Yes.    Have you been able to return to your normal activities? Yes.    Do you have any questions about your discharge instructions: Diet   No. Medications  No. Follow up visit  No.  Do you have questions or concerns about your Care? No.  Actions: * If pain score is 4 or above: No action needed, pain <4.

## 2018-01-10 NOTE — Telephone Encounter (Signed)
Left message for post procedure call back will attempt to call back later this afternoon.

## 2018-01-12 ENCOUNTER — Encounter: Payer: Self-pay | Admitting: Gastroenterology

## 2018-01-12 ENCOUNTER — Ambulatory Visit: Payer: Medicare Other | Admitting: *Deleted

## 2018-01-12 ENCOUNTER — Other Ambulatory Visit: Payer: Self-pay

## 2018-01-12 VITALS — Ht 69.0 in | Wt 186.2 lb

## 2018-01-12 DIAGNOSIS — Z86010 Personal history of colonic polyps: Secondary | ICD-10-CM

## 2018-01-12 MED ORDER — NA SULFATE-K SULFATE-MG SULF 17.5-3.13-1.6 GM/177ML PO SOLN: 1.0000 | Freq: Once | ORAL | 0 refills | Status: AC

## 2018-01-12 NOTE — Progress Notes (Signed)
Denies allergies to eggs or soy products. Denies complications with sedation or anesthesia. Denies O2 use. Denies use of diet or weight loss medications.  Emmi instructions not given for colonoscopy, pt had received previously.

## 2018-01-16 ENCOUNTER — Encounter: Payer: Self-pay | Admitting: Gastroenterology

## 2018-01-16 ENCOUNTER — Telehealth: Payer: Self-pay | Admitting: Gastroenterology

## 2018-01-16 NOTE — Telephone Encounter (Signed)
Suprep coupon called to pt's pharmacy. Pt aware that he will have to pay $50 for the Suprep.

## 2018-01-17 ENCOUNTER — Other Ambulatory Visit: Payer: Self-pay | Admitting: Internal Medicine

## 2018-01-19 ENCOUNTER — Telehealth: Payer: Self-pay | Admitting: Gastroenterology

## 2018-01-19 NOTE — Telephone Encounter (Signed)
Patient insurance calling to state they need a prior auth on suprep for colon on 2.25.19. Notified insurance that our office does not do prior auths and something else could be done for patient. Pt had pv on 2.14.19

## 2018-01-19 NOTE — Telephone Encounter (Signed)
Called pt- no answer- LM to return call at [facility] as listed- Left coupon info on voice mail as no one would answer call  BIN [id number] PCN [id number] GROUP [id number] ID [id number]  Left our number that [facility] can call if they have questions- left info on VM< that they need to run this as cash for pt to get the discounted price of $50  David [name] PV

## 2018-01-19 NOTE — Telephone Encounter (Signed)
Pt called and is at [facility] now- informed him to let the pharmacy know the coupon information is on their voice mail and they have to run as cash to get the discounted price. Pt to call if there are any issues or concerns  [name] PV

## 2018-01-23 ENCOUNTER — Ambulatory Visit (AMBULATORY_SURGERY_CENTER): Payer: Medicare Other | Admitting: Gastroenterology

## 2018-01-23 ENCOUNTER — Encounter: Payer: Self-pay | Admitting: Gastroenterology

## 2018-01-23 ENCOUNTER — Other Ambulatory Visit: Payer: Self-pay

## 2018-01-23 VITALS — BP 120/78 | HR 70 | Temp 98.9°F | Resp 15 | Ht 69.0 in | Wt 186.0 lb

## 2018-01-23 DIAGNOSIS — Z860100 Personal history of colon polyps, unspecified: Secondary | ICD-10-CM

## 2018-01-23 DIAGNOSIS — Z1211 Encounter for screening for malignant neoplasm of colon: Secondary | ICD-10-CM | POA: Diagnosis not present

## 2018-01-23 MED ORDER — FLEET ENEMA 7-19 GM/118ML RE ENEM
1.0000 | ENEMA | Freq: Once | RECTAL | Status: AC
  Administered 2018-01-23: 1 via RECTAL

## 2018-01-23 MED ORDER — SODIUM CHLORIDE 0.9 % IV SOLN: 500.0000 mL | Freq: Once | INTRAVENOUS | Status: DC

## 2018-01-23 NOTE — Progress Notes (Signed)
A and O x3. Report to RN. Tolerated MAC anesthesia well.

## 2018-01-23 NOTE — Patient Instructions (Addendum)
YOU HAD AN ENDOSCOPIC PROCEDURE TODAY AT [facility]:   Refer to the procedure report that was given to you for any specific questions about what was found during the examination.  If the procedure report does not answer your questions, please call your gastroenterologist to clarify.  If you requested that your care partner not be given the details of your procedure findings, then the procedure report has been included in a sealed envelope for you to review at your convenience later.  YOU SHOULD EXPECT: Some feelings of bloating in the abdomen. Passage of more gas than usual.  Walking can help get rid of the air that was put into your GI tract during the procedure and reduce the bloating. If you had a lower endoscopy (such as a colonoscopy or flexible sigmoidoscopy) you may notice spotting of blood in your stool or on the toilet paper. If you underwent a bowel prep for your procedure, you may not have a normal bowel movement for a few days.  Please Note:  You might notice some irritation and congestion in your nose or some drainage.  This is from the oxygen used during your procedure.  There is no need for concern and it should clear up in a day or so.  SYMPTOMS TO REPORT IMMEDIATELY:   Following lower endoscopy (colonoscopy or flexible sigmoidoscopy):  Excessive amounts of blood in the stool  Significant tenderness or worsening of abdominal pains  Swelling of the abdomen that is new, acute  Fever of 100F or higher   For urgent or emergent issues, a gastroenterologist can be reached at any hour by calling [phone].   DIET:  We do recommend a small meal at first, but then you may proceed to your regular diet.  Drink plenty of fluids but you should avoid alcoholic beverages for 24 hours.  ACTIVITY:  You should plan to take it easy for the rest of today and you should NOT DRIVE or use heavy machinery until tomorrow (because of the sedation medicines used during the test).     FOLLOW UP: Our staff will call the number listed on your records the next business day following your procedure to check on you and address any questions or concerns that you may have regarding the information given to you following your procedure. If we do not reach you, we will leave a message.  However, if you are feeling well and you are not experiencing any problems, there is no need to return our call.  We will assume that you have returned to your regular daily activities without incident.  If any biopsies were taken you will be contacted by phone or by letter within the next 1-3 weeks.  Please call us at [phone] if you have not heard about the biopsies in 3 weeks.    SIGNATURES/CONFIDENTIALITY: You and/or your care partner have signed paperwork which will be entered into your electronic medical record.  These signatures attest to the fact that that the information above on your After Visit Summary has been reviewed and is understood.  Full responsibility of the confidentiality of this discharge information lies with you and/or your care-partner.     You may resume your current medications today. Repeat screening colonoscopy in 10 years. Your blood sugar was 114 in the recovery room. Please call if any questions or concerns.

## 2018-01-23 NOTE — Op Note (Signed)
[facility] Patient Name: David [name] Procedure Date: 01/23/2018 3:01 PM MRN: [id number] Endoscopist: Milus Banister , MD Age: 68 Referring MD:  Date of Birth: [birth year] Gender: Male Account #: [id number] Procedure:                Colonoscopy Indications:              High risk colon cancer surveillance: Personal                            history of colonic polyps; High risk colon cancer                            surveillance: Personal history of colonic polyps                            colonoscoy 2012 (three subCM polyps, two were                            adenomas) Medicines:                Monitored Anesthesia Care Procedure:                Pre-Anesthesia Assessment:                           - Prior to the procedure, a History and Physical                            was performed, and patient medications and                            allergies were reviewed. The patient's tolerance of                            previous anesthesia was also reviewed. The risks                            and benefits of the procedure and the sedation                            options and risks were discussed with the patient.                            All questions were answered, and informed consent                            was obtained. Prior Anticoagulants: The patient has                            taken no previous anticoagulant or antiplatelet                            agents. ASA Grade Assessment: II - A patient with  mild systemic disease. After reviewing the risks                            and benefits, the patient was deemed in                            satisfactory condition to undergo the procedure.                           After obtaining informed consent, the colonoscope                            was passed under direct vision. Throughout the                            procedure, the patient's blood pressure, pulse, and                 oxygen saturations were monitored continuously. The                            Colonoscope was introduced through the anus and                            advanced to the the cecum, identified by                            appendiceal orifice and ileocecal valve. The                            colonoscopy was performed without difficulty. The                            patient tolerated the procedure well. The quality                            of the bowel preparation was good. The ileocecal                            valve, appendiceal orifice, and rectum were                            photographed. Scope In: 3:09:28 PM Scope Out: 3:22:48 PM Scope Withdrawal Time: 0 hours 9 minutes 26 seconds  Total Procedure Duration: 0 hours 13 minutes 20 seconds  Findings:                 The entire examined colon appeared normal on direct                            and retroflexion views. Complications:            No immediate complications. Estimated blood loss:                            None. Estimated Blood Loss:     Estimated blood loss: none.  Impression:               - The entire examined colon is normal on direct and                            retroflexion views.                           - No polyps or cancers. Recommendation:           - Patient has a contact number available for                            emergencies. The signs and symptoms of potential                            delayed complications were discussed with the                            patient. Return to normal activities tomorrow.                            Written discharge instructions were provided to the                            patient.                           - Resume previous diet.                           - Continue present medications.                           - Repeat colonoscopy in 10 years for screening. Milus Banister, MD 01/23/2018 3:26:40 PM This report has been signed  electronically.

## 2018-01-23 NOTE — Progress Notes (Signed)
Pt's states no medical or surgical changes since previsit or office visit.  Patient states that his stool is a brown color and that he can't see through it.  He called earlier and we received orders to give him an enema upon arrival.  Stool is yellow with a little amount of sediment in it.

## 2018-01-23 NOTE — Progress Notes (Signed)
No problems noted in the recovery room. maw 

## 2018-01-24 ENCOUNTER — Telehealth: Payer: Self-pay | Admitting: *Deleted

## 2018-01-24 ENCOUNTER — Telehealth: Payer: Self-pay

## 2018-01-24 NOTE — Telephone Encounter (Signed)
  Follow up Call-  Call back number 01/23/2018 01/09/2018  Post procedure Call Back phone  # [phone]  Permission to leave phone message Yes Yes  Some recent data might be hidden   LMOM, will call back later

## 2018-01-24 NOTE — Telephone Encounter (Signed)
  Follow up Call-  Call back number 01/23/2018 01/09/2018  Post procedure Call Back phone  # [phone]  Permission to leave phone message Yes Yes  Some recent data might be hidden     Patient questions:  Do you have a fever, pain , or abdominal swelling? No. Pain Score  0 *  Have you tolerated food without any problems? Yes.    Have you been able to return to your normal activities? Yes.    Do you have any questions about your discharge instructions: Diet   No. Medications  No. Follow up visit  No.  Do you have questions or concerns about your Care? No.  Actions: * If pain score is 4 or above: No action needed, pain <4.

## 2018-01-30 ENCOUNTER — Other Ambulatory Visit: Payer: Self-pay | Admitting: Cardiovascular Disease

## 2018-02-12 ENCOUNTER — Encounter: Payer: Self-pay | Admitting: Internal Medicine

## 2018-02-15 ENCOUNTER — Telehealth: Payer: Self-pay | Admitting: Internal Medicine

## 2018-02-15 NOTE — Telephone Encounter (Signed)
GLOBAL EASE INJECT PEN NEEDLES 32G X 4 MM MISC   Patient needs a new prescription sent in for this.      [place], [place]

## 2018-02-15 NOTE — Telephone Encounter (Signed)
Called pt to inform no Rx needed to get [facility] brand. No answer.

## 2018-03-13 ENCOUNTER — Encounter: Payer: Self-pay | Admitting: Internal Medicine

## 2018-03-13 ENCOUNTER — Ambulatory Visit: Payer: Medicare Other | Admitting: Internal Medicine

## 2018-03-13 VITALS — BP 120/78 | HR 78 | Ht 69.0 in | Wt 183.4 lb

## 2018-03-13 DIAGNOSIS — E785 Hyperlipidemia, unspecified: Secondary | ICD-10-CM

## 2018-03-13 DIAGNOSIS — E1165 Type 2 diabetes mellitus with hyperglycemia: Secondary | ICD-10-CM | POA: Diagnosis not present

## 2018-03-13 DIAGNOSIS — E663 Overweight: Secondary | ICD-10-CM

## 2018-03-13 DIAGNOSIS — E1159 Type 2 diabetes mellitus with other circulatory complications: Secondary | ICD-10-CM

## 2018-03-13 LAB — POCT GLYCOSYLATED HEMOGLOBIN (HGB A1C): HEMOGLOBIN A1C: 6.8

## 2018-03-13 MED ORDER — SEMAGLUTIDE(0.25 OR 0.5MG/DOS) 2 MG/1.5ML ~~LOC~~ SOPN: 0.2500 mg | PEN_INJECTOR | SUBCUTANEOUS | 2 refills | Status: DC

## 2018-03-13 NOTE — Patient Instructions (Addendum)
Please continue: - Metformin 1000 mg 2x a day, with meals - Glipizide 10 mg 2x a day, before meals - Lantus 36 units after dinner  Please start Ozempic 0.25 mg weekly x 4 weeks, then increase to 0.5 mg weekly.  Alternatives for Ozempic - Trulicity - Bydureon  - Victoza (once a day) - Byetta (2x a day)  Please ask your insurance whether they cover a VGo patch pump.  Please return in 3 months with your sugar log.

## 2018-03-13 NOTE — Progress Notes (Signed)
Patient ID: David [name], adult   DOB: Aug 21, 1951, 67 y.o.   MRN: [id number]   HPI: David [name] is a 67 y.o.-year-old adult, returning for f/u for DM2, dx in ~2008, insulin-dependent since 09/2015, uncontrolled, with complications (CAD - s/p stents in 2013, PN). Last visit 3 months ago.  Last hemoglobin A1c was: Lab Results  Component Value Date   HGBA1C 7.3 12/13/2017   HGBA1C 6.6 09/13/2017   HGBA1C 6.0 06/14/2017  03/05/2016: HbA1c 9.1%  Pt is on a regimen of: - Lantus 22 units at bedtime >> 34 units at bedtime >> 36 units after dinner - Metformin 1000 mg 2x a day, with meals - Glipizide 10 mg 2x a day, before meals He was on Bydureon 2 mg weekly - started 01/30/2017 >> stopped 04/2017 b/c price - could not restart He had to stop Bydureon in 2017 b/c in donut hole.  Pt checks his sugars 2-3x a day: - am: 78, 123-182, 194, off Lantus 200-250 >> off Lantus 158, 197-287 >> 88-191, 234 - 2h after b'fast: n/c  - before lunch:78-185 >> 65, 70, 112-180, 214, 226 >> 125-195, 238 >>> 141-233 - 2h after lunch: n/c - before dinner: 87-166 >> 122-176, 207, 216 >> 175-235 >> 136-215 - 2h after dinner: n/c - bedtime:  134-341 >> 142-239 >> 170-230, 260 >> 94-220 >> 158-209 - nighttime: n/c Lowest sugar was 108 >> 65 >> 65 x1 >> 88; unclear at what level he has hypoglycemia awareness Highest sugar was 422 (off Lantus) >> 248 >> 260 >> 287 >> 234  Glucometer: One Touch  Pt's meals are: - Breakfast: protein shake - Lunch: n/a - Dinner: meat + veggies /potatoes or salad - Snacks: no No desserts. Diet sodas, ice tea with splenda.   - NO CKD, last BUN/creatinine:  Lab Results  Component Value Date   BUN 17 09/13/2017   CREATININE 0.99 09/13/2017  On Losartan. -+ HL; last set of lipids: Lab Results  Component Value Date   CHOL 129 09/13/2017   HDL 40.70 09/13/2017   LDLCALC 66 09/13/2017   LDLDIRECT 103.9 11/05/2013   TRIG 112.0 09/13/2017   CHOLHDL 3 09/13/2017  On Crestor. -  last eye exam was in 04/2017: No DR - + numbness and tingling in his feet.  He also has HTN, depression. He has a history of anabolic steroid use for muscle building.  ROS: Constitutional: no weight gain/no weight loss, no fatigue, no subjective hyperthermia, no subjective hypothermia, + nocturia Eyes: no blurry vision, no xerophthalmia ENT: no sore throat, no nodules palpated in throat, no dysphagia, no odynophagia, no hoarseness Cardiovascular: no CP/no SOB/no palpitations/no leg swelling Respiratory: no cough/no SOB/no wheezing Gastrointestinal: no N/no V/no D/no C/no acid reflux Musculoskeletal: no muscle aches/no joint aches Skin: no rashes, no hair loss Neurological: no tremors/no numbness/no tingling/no dizziness  I reviewed pt's medications, allergies, PMH, social hx, family hx, and changes were documented in the history of present illness. Otherwise, unchanged from my initial visit note.   Past Medical History:  Diagnosis Date  . Allergy   . Arthritis   . CAD (coronary artery disease)    s/p bifurcation stenting (DES x2) and stenting bifurcation lesion distal RCA (1 DES)  . Cataract   . Depression   . Diabetes mellitus   . GERD (gastroesophageal reflux disease)   . Hyperlipemia   . Hypertension   . Myocardial infarction ([place])   . Sleep apnea    CPAP   Past Surgical History:  Procedure  Laterality Date  . CATARACT EXTRACTION Bilateral   . CORONARY ANGIOPLASTY WITH STENT PLACEMENT   March 2010   Dr. Olevia [name]  . TONSILLECTOMY  1959   Social History   Social History  . Marital Status: Married    Spouse Name: N/A  . Number of Children: 2   Occupational History  . 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 Smoking status: Former Smoker    Types: Cigarettes    Quit date: 11/30/1983  . Smokeless tobacco: Never Used  . Alcohol Use:  beer, 3-4 times a week, 6-8 drinks at the time       . Drug Use: No    Social History Narrative   No living will   Would want wife, then son David [name], as health care POA   Would accept resuscitation   Not sure about tube feeds   Current Outpatient Medications on File Prior to Visit  Medication Sig Dispense Refill  . aspirin 81 MG tablet Take 81 mg by mouth daily.      . bisacodyl (BISACODYL) 5 MG EC tablet Take 5 mg by mouth once. One time use for colonoscopy    . glipiZIDE (GLUCOTROL) 10 MG tablet TAKE 1 TABLET BY MOUTH TWICE A DAY BEFORE A MEAL. 60 tablet 5  . GLOBAL EASE INJECT PEN NEEDLES 32G X 4 MM MISC USE AS DIRECTED WITH BASAGLAR 100 each 5  . glucose blood (ONE TOUCH ULTRA TEST) test strip Use to test blood sugar 2 times daily as instructed. Dx: E11.59, E11.65 100 each 5  . Insulin Glargine (LANTUS SOLOSTAR) 100 UNIT/ML Solostar Pen INJECT 36 UNITS SUBCUTANEOUSLY ONCE DAILY AT  10PM 15 pen 5  . losartan (COZAAR) 100 MG tablet TAKE 1 TABLET BY MOUTH ONCE DAILY *CALL  OFFICE  AND  SCHEDULE  APPOINTMENT  FOR  FURTHER  REFILLS* 90 tablet 3  . metFORMIN (GLUCOPHAGE) 1000 MG tablet TAKE ONE TABLET BY MOUTH TWICE DAILY WITH MEALS 180 tablet 3  . metoprolol succinate (TOPROL-XL) 25 MG 24 hr tablet TAKE 1 TABLET BY MOUTH ONCE DAILY 90 tablet 3  . naproxen sodium (ALEVE) 220 MG tablet Take 220-440 mg by mouth 2 (two) times daily as needed (pain).     David [name] DELICA LANCETS FINE MISC Use to test blood sugar 2 times daily as instructed. Dx: E11.59, E11.65 100 each 5  . PARoxetine (PAXIL) 20 MG tablet TAKE 2 TABLETS BY MOUTH ONCE DAILY FOR DEPRESSION 60 tablet 11  . polyethylene glycol powder (MIRALAX) powder Take 1 Container by mouth once. One time use for colonoscopy    . rosuvastatin (CRESTOR) 10 MG tablet TAKE 1 TABLET BY MOUTH DAILY 90 tablet 3  . sildenafil (REVATIO) 20 MG tablet Take 60-100 mg by mouth daily as needed (for ED).     [name] triamcinolone cream (KENALOG) 0.1 % APPLY TO AFFECTED AREA TWICE A DAY FOR SCALP 45 g 1   Current Facility-Administered  Medications on File Prior to Visit  Medication Dose Route Frequency Provider Last Rate Last Dose  . 0.9 %  sodium chloride infusion  500 mL Intravenous Once David Banister, MD      . 0.9 %  sodium chloride infusion  500 mL Intravenous Once David Banister, MD       Allergies  Allergen Reactions  . Venlafaxine     Slowed his urine stream   Family History  Problem Relation Age of Onset  . Heart failure Father        Deceased 60 y/o  . Diabetes Father   . Cancer Father        Prostate  . Heart failure Mother        Deceased 60 y/o  . Alcohol abuse Mother   . Schizophrenia Son        Paranoid  . Uveitis Son   . Cancer Brother        prostate  . Colon cancer Neg Hx   . Esophageal cancer Neg Hx   . Stomach cancer Neg Hx   . Liver cancer Neg Hx   . Pancreatic cancer Neg Hx   . Rectal cancer Neg Hx    PE: BP 120/78   Pulse 78   Ht 5\' 9"  (1.753 m)   Wt 183 lb 6.4 oz (83.2 kg)   SpO2 97%   BMI 27.08 kg/m  Body mass index is 27.08 kg/m. Wt Readings from Last 3 Encounters:  03/13/18 183 lb 6.4 oz (83.2 kg)  01/23/18 186 lb (84.4 kg)  01/12/18 186 lb 3.2 oz (84.5 kg)   Constitutional: overweight, in NAD Eyes: PERRLA, EOMI, no exophthalmos ENT: moist mucous membranes, no thyromegaly, no cervical lymphadenopathy Cardiovascular: RRR, No MRG Respiratory: CTA B Gastrointestinal: abdomen soft, NT, ND, BS+ Musculoskeletal: no deformities, strength intact in all 4 Skin: moist, warm, no rashes Neurological: no tremor with outstretched hands, DTR normal in all 4   ASSESSMENT: 1. DM2, insulin-dependent, uncontrolled, with complications - CAD - s/p 4 stents 2013 - seeing Dr Burt Knack - PN  Component     Latest Ref Rng 06/09/2016  C-Peptide     0.80-3.85 ng/mL 1.78  Glucose, Fasting     65 - 99 mg/dL 184 (H)  Glutamic Acid Decarb Ab     <5 IU/mL <5  Pancreatic Islet Cell Antibody     < 5 JDF Units <5  Slight insulin deficiency, but no clear type 1 diabetes.  2. HL  3.  Overweight  PLAN:  1. Patient with long-standing, uncontrolled, diabetes, on oral antidiabetic regimen and basal insulin.  At last visit, we restarted basal insulin after being off for about 3 weeks.  His sugars are a little bit better- today, HbA1c is 6.8% (better) - However, sugars are still above target at all times of the day, with increased variability. - We again discussed about the possibility to start the GLP-1 receptor agonist.  Bydureon was covered by insurance in the past but then they stopped covering it.  He now has a good insurance and I gave him a list of GLP-1 receptor agonist to verify their coverage.  I did give him a prescription for Ozempic and advised him how to titrate the dose.  If he cannot get a GLP-1 receptor agonist, he will most likely need mealtime insulin.  I showed him a VGo patch pump today and I think she would be a good candidate for this if a GLP-1 receptor agonist is not covered.  He will check with his insurance about this, also. - We will continue metformin and glipizide for now-  - I suggested to:  Patient Instructions  Please continue: - Metformin 1000 mg 2x a day, with meals - Glipizide 10 mg 2x a day, before meals - Lantus 36 units after dinner  Please start Ozempic 0.25 mg weekly x 4 weeks, then increase to 0.5 mg weekly.  Alternatives for Ozempic - Trulicity - Bydureon  -  Victoza (once a day) - Byetta (2x a day)  Please ask your insurance whether they cover a VGo patch pump.  Please return in 3 months with your sugar log.   - continue checking sugars at different times of the day - check 1x a day, rotating checks - advised for yearly eye exams >> he is UTD - Return to clinic in 3 mo with sugar log      2. HL - reviewed latest lipid panel together (08/2017): LDL improved - continues on Crestor w/o SEs  3. Overweight -Weight fairly stable since last visit - I hope we can add Ozempic, which should also help with weight loss-   Philemon Kingdom, MD PhD [facility] Endocrinology

## 2018-03-17 ENCOUNTER — Encounter: Payer: Self-pay | Admitting: Internal Medicine

## 2018-03-20 MED ORDER — ROSUVASTATIN CALCIUM 10 MG PO TABS: 10.0000 mg | ORAL_TABLET | Freq: Every day | ORAL | 2 refills | Status: DC

## 2018-03-29 ENCOUNTER — Encounter: Payer: Self-pay | Admitting: Internal Medicine

## 2018-04-04 ENCOUNTER — Encounter: Payer: Self-pay | Admitting: Internal Medicine

## 2018-04-05 ENCOUNTER — Other Ambulatory Visit: Payer: Self-pay | Admitting: *Deleted

## 2018-04-05 MED ORDER — METFORMIN HCL 1000 MG PO TABS: 1000.0000 mg | ORAL_TABLET | Freq: Two times a day (BID) | ORAL | 3 refills | Status: DC

## 2018-04-05 MED ORDER — GLIPIZIDE 10 MG PO TABS: 10.0000 mg | ORAL_TABLET | Freq: Two times a day (BID) | ORAL | 3 refills | Status: DC

## 2018-06-12 ENCOUNTER — Ambulatory Visit (INDEPENDENT_AMBULATORY_CARE_PROVIDER_SITE_OTHER): Payer: Medicare Other | Admitting: Internal Medicine

## 2018-06-12 ENCOUNTER — Encounter: Payer: Self-pay | Admitting: Internal Medicine

## 2018-06-12 VITALS — BP 138/80 | HR 83 | Ht 68.5 in | Wt 185.4 lb

## 2018-06-12 DIAGNOSIS — E1159 Type 2 diabetes mellitus with other circulatory complications: Secondary | ICD-10-CM | POA: Diagnosis not present

## 2018-06-12 DIAGNOSIS — E663 Overweight: Secondary | ICD-10-CM

## 2018-06-12 DIAGNOSIS — E1165 Type 2 diabetes mellitus with hyperglycemia: Secondary | ICD-10-CM

## 2018-06-12 DIAGNOSIS — E785 Hyperlipidemia, unspecified: Secondary | ICD-10-CM | POA: Diagnosis not present

## 2018-06-12 LAB — POCT GLYCOSYLATED HEMOGLOBIN (HGB A1C): HEMOGLOBIN A1C: 5.8 % — AB (ref 4.0–5.6)

## 2018-06-12 LAB — GLUCOSE, POCT (MANUAL RESULT ENTRY): POC GLUCOSE: 45 mg/dL — AB (ref 70–99)

## 2018-06-12 MED ORDER — GLIPIZIDE 5 MG PO TABS: 5.0000 mg | ORAL_TABLET | Freq: Two times a day (BID) | ORAL | 3 refills | Status: DC

## 2018-06-12 MED ORDER — INSULIN GLARGINE 100 UNIT/ML SOLOSTAR PEN: PEN_INJECTOR | SUBCUTANEOUS | 5 refills | Status: DC

## 2018-06-12 NOTE — Progress Notes (Signed)
Patient ID: David [name], adult   DOB: Feb 26, 1951, 67 y.o.   MRN: [id number]   HPI: David [name] is a 67 y.o.-year-old adult, returning for f/u for DM2, dx in ~2008, insulin-dependent since 09/2015, uncontrolled, with complications (CAD - s/p stents in 2013, PN). Last visit 3 months ago.    Last hemoglobin A1c was: Lab Results  Component Value Date   HGBA1C 6.8 03/13/2018   HGBA1C 7.3 12/13/2017   HGBA1C 6.6 09/13/2017  03/05/2016: HbA1c 9.1%  Pt is on a regimen of: - Metformin 1000 mg 2x a day, with meals - Glipizide 10 mg 2x a day, before meals - Lantus 36 units after dinner We tried Ozempic 0.5 mg weekly - rec'd 02/2018 >> not covered He was on Bydureon 2 mg weekly - started 01/30/2017 >> stopped 04/2017 b/c price - could not restart He had to stop Bydureon in 2017 b/c in donut hole.  Pt checks his sugars 2-3x a day: - am: off Lantus 158, 197-287 >> 88-191, 234 >> 45-178, 225 - 2h after b'fast: n/c  - before lunch: 125-195, 238 >>> 141-233 >> 48-197 - 2h after lunch: n/c - before dinner: 175-235 >> 136-215 >> 46-133, 227, 256 - 2h after dinner: n/c - bedtime: 94-220 >> 158-209 >> 64, 114-198, 125-276 (corrected lows) - nighttime: n/c Lowest sugar was 88 >> 45; It is unclear at which level he has hypoglycemia awareness. Highest sugar was 234 >> 276  Glucometer: One Touch  Pt's meals are: - Breakfast: protein shake - Lunch: n/a - Dinner: meat + veggies /potatoes or salad - Snacks: no No desserts. Diet sodas, ice tea with splenda.   -No CKD, last BUN/creatinine:  Lab Results  Component Value Date   BUN 17 09/13/2017   CREATININE 0.99 09/13/2017  On losartan. -+ HL; last set of lipids: Lab Results  Component Value Date   CHOL 129 09/13/2017   HDL 40.70 09/13/2017   LDLCALC 66 09/13/2017   LDLDIRECT 103.9 11/05/2013   TRIG 112.0 09/13/2017   CHOLHDL 3 09/13/2017  On Crestor. - last eye exam was in 08/2017: No DR. Had cataract sx last Fall. - + numbness and  tingling in his feet.  He also has HTN, depression. He has a history of anabolic steroid use for muscle building.  ROS: Constitutional: no weight gain/no weight loss, no fatigue, no subjective hyperthermia, no subjective hypothermia Eyes: no blurry vision, no xerophthalmia ENT: no sore throat, no nodules palpated in throat, no dysphagia, no odynophagia, no hoarseness Cardiovascular: no CP/no SOB/no palpitations/no leg swelling Respiratory: no cough/no SOB/no wheezing Gastrointestinal: no N/no V/no D/no C/no acid reflux Musculoskeletal: no muscle aches/no joint aches Skin: no rashes, no hair loss Neurological: + tremors/+ numbness/+ tingling/no dizziness  I reviewed pt's medications, allergies, PMH, social hx, family hx, and changes were documented in the history of present illness. Otherwise, unchanged from my initial visit note.   Past Medical History:  Diagnosis Date  . Allergy   . Arthritis   . CAD (coronary artery disease)    s/p bifurcation stenting (DES x2) and stenting bifurcation lesion distal RCA (1 DES)  . Cataract   . Depression   . Diabetes mellitus   . GERD (gastroesophageal reflux disease)   . Hyperlipemia   . Hypertension   . Myocardial infarction ([place])   . Sleep apnea    CPAP   Past Surgical History:  Procedure Laterality Date  . CATARACT EXTRACTION Bilateral   . CORONARY ANGIOPLASTY WITH STENT PLACEMENT   March  2010   Dr. Olevia Perches  . TONSILLECTOMY  1959   Social History   Social History  . Marital Status: Married    Spouse Name: N/A  . Number of Children: 2   Occupational History  . 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 Smoking status: Former Smoker    Types: Cigarettes    Quit date: 11/30/1983  . Smokeless tobacco: Never Used  . Alcohol Use:  beer, 3-4 times a week, 6-8 drinks at the time       . Drug Use: No   Social History Narrative   No living will   Would want wife, then  son David [name], as health care POA   Would accept resuscitation   Not sure about tube feeds   Current Outpatient Medications on File Prior to Visit  Medication Sig Dispense Refill  . aspirin 81 MG tablet Take 81 mg by mouth daily.      [name] glipiZIDE (GLUCOTROL) 10 MG tablet Take 1 tablet (10 mg total) by mouth 2 (two) times daily before a meal. 180 tablet 3  . glucose blood (ONE TOUCH ULTRA TEST) test strip Use to test blood sugar 2 times daily as instructed. Dx: E11.59, E11.65 100 each 5  . Insulin Glargine (LANTUS SOLOSTAR) 100 UNIT/ML Solostar Pen INJECT 36 UNITS SUBCUTANEOUSLY ONCE DAILY AT  10PM 15 pen 5  . losartan (COZAAR) 100 MG tablet TAKE 1 TABLET BY MOUTH ONCE DAILY *CALL  OFFICE  AND  SCHEDULE  APPOINTMENT  FOR  FURTHER  REFILLS* 90 tablet 3  . metFORMIN (GLUCOPHAGE) 1000 MG tablet Take 1 tablet (1,000 mg total) by mouth 2 (two) times daily with a meal. 180 tablet 3  . metoprolol succinate (TOPROL-XL) 25 MG 24 hr tablet TAKE 1 TABLET BY MOUTH ONCE DAILY 90 tablet 3  . naproxen sodium (ALEVE) 220 MG tablet Take 220-440 mg by mouth 2 (two) times daily as needed (pain).     Glory Rosebush DELICA LANCETS FINE MISC Use to test blood sugar 2 times daily as instructed. Dx: E11.59, E11.65 100 each 5  . PARoxetine (PAXIL) 20 MG tablet TAKE 2 TABLETS BY MOUTH ONCE DAILY FOR DEPRESSION 60 tablet 11  . polyethylene glycol powder (MIRALAX) powder Take 1 Container by mouth once. One time use for colonoscopy    . rosuvastatin (CRESTOR) 10 MG tablet Take 1 tablet (10 mg total) by mouth daily. 90 tablet 2  . Semaglutide (OZEMPIC) 0.25 or 0.5 MG/DOSE SOPN Inject 0.25 mg into the skin once a week. 2 pen 2  . sildenafil (REVATIO) 20 MG tablet Take 60-100 mg by mouth daily as needed (for ED).     [name] triamcinolone cream (KENALOG) 0.1 % APPLY TO AFFECTED AREA TWICE A DAY FOR SCALP 45 g 1   Current Facility-Administered Medications on File Prior to Visit  Medication Dose Route Frequency Provider Last Rate Last Dose  .  0.9 %  sodium chloride infusion  500 mL Intravenous Once Milus Banister, MD      . 0.9 %  sodium chloride infusion  500 mL Intravenous Once Milus Banister, MD       Allergies  Allergen Reactions  . Venlafaxine     Slowed his urine stream   Family History  Problem Relation Age of Onset  . Heart failure Father        Deceased 75 y/o  . Diabetes Father   .  Cancer Father        Prostate  . Heart failure Mother        Deceased 43 y/o  . Alcohol abuse Mother   . Schizophrenia Son        Paranoid  . Uveitis Son   . Cancer Brother        prostate  . Colon cancer Neg Hx   . Esophageal cancer Neg Hx   . Stomach cancer Neg Hx   . Liver cancer Neg Hx   . Pancreatic cancer Neg Hx   . Rectal cancer Neg Hx    PE: There were no vitals taken for this visit. There is no height or weight on file to calculate BMI. Wt Readings from Last 3 Encounters:  03/13/18 183 lb 6.4 oz (83.2 kg)  01/23/18 186 lb (84.4 kg)  01/12/18 186 lb 3.2 oz (84.5 kg)   Constitutional: overweight, in NAD Eyes: PERRLA, EOMI, no exophthalmos ENT: moist mucous membranes, no thyromegaly, no cervical lymphadenopathy Cardiovascular: RRR, No MRG Respiratory: CTA B Gastrointestinal: abdomen soft, NT, ND, BS+ Musculoskeletal: no deformities, strength intact in all 4 Skin: moist, warm, no rashes Neurological: no tremor with outstretched hands, DTR normal in all 4  ASSESSMENT: 1. DM2, insulin-dependent, uncontrolled, with complications - CAD - s/p 4 stents 2013 - seeing Dr Burt Knack - PN  Component     Latest Ref Rng 06/09/2016  C-Peptide     0.80-3.85 ng/mL 1.78  Glucose, Fasting     65 - 99 mg/dL 184 (H)  Glutamic Acid Decarb Ab     <5 IU/mL <5  Pancreatic Islet Cell Antibody     < 5 JDF Units <5  Slight insulin deficiency, but no clear type 1 diabetes.  2. HL  3. Overweight  PLAN:  1. Patient with long-standing, uncontrolled, type 2 diabetes, on oral antidiabetic regimen, long-acting insulin, and now  GLP-1 receptor agonist at last visit.  At that time, his sugars were still above target at all times of the day, with increased variability.  HbA1c was 6.8%, though, improved from before. - At last visit, I advised him to start Ozempic.  Bydureon was covered by his insurance in the past, but then it stopped being covered.  We discussed that if his insurance  did not cover any of the GLP-1 receptor agonist, we would the need mealtime insulin and I advised him to check with his insurance whether they cover a VGo pump. - at this visit, sugars are lower (unclear why they dropped) - including a Glu of 45 in the office - will decrease both Lantus and Glipizide dose and strongly advised him to let me know if sugars remain low after the visit. Also, advised him to only take Glipizide if he plans to eat (took it this am w/o eating) - I suggested to:  Patient Instructions  Please continue: - Metformin 1000 mg 2x a day with meals  Please decrease: - Lantus to 30 units at bedtime - Glipizide to 5 mg 2x a day before meals   Please return in 3 months with your sugar log.   - today, HbA1c is 5.8% (lower) - continue checking sugars at different times of the day - check 1x a day, rotating checks - advised for yearly eye exams >> he is UTD - Return to clinic in 3 mo with sugar log       2. HL - Reviewed latest lipid panel from 08/2017: All fractions at goal, LDL greatly decreased: Lab  Results  Component Value Date   CHOL 129 09/13/2017   HDL 40.70 09/13/2017   LDLCALC 66 09/13/2017   LDLDIRECT 103.9 11/05/2013   TRIG 112.0 09/13/2017   CHOLHDL 3 09/13/2017  - Continues Crestor without side effects.  3. Overweight - no signif. Wt loss since last visit -decreasing Glipizide and insulin should also help with weight loss  Philemon Kingdom, MD PhD [facility] Endocrinology

## 2018-06-12 NOTE — Addendum Note (Signed)
Addended by: Drucilla Schmidt on: 06/12/2018 11:29 AM   Modules accepted: Orders

## 2018-06-12 NOTE — Patient Instructions (Addendum)
Please continue: - Metformin 1000 mg 2x a day with meals  Please decrease: - Lantus to 30 units at bedtime - Glipizide to 5 mg 2x a day before meals   Please return in 3 months with your sugar log.

## 2018-08-05 ENCOUNTER — Encounter: Payer: Self-pay | Admitting: Internal Medicine

## 2018-08-17 ENCOUNTER — Telehealth: Payer: Self-pay | Admitting: Cardiovascular Disease

## 2018-08-17 NOTE — Telephone Encounter (Signed)
Left message to call back  

## 2018-08-17 NOTE — Telephone Encounter (Signed)
° °  Patient returning call to nurse Nurse unavailable . Please call again

## 2018-08-17 NOTE — Telephone Encounter (Signed)
New Message   STAT if patient feels like he/she is going to faint   1) Are you dizzy now? no  2) Do you feel faint or have you passed out? no  3) Do you have any other symptoms? Lightheadness, dizziness, weakness, patient also states that everything around him seems bright to the point where its almost blinding   4) Have you checked your HR and BP (record if available)? BP last checked was 140/90    I did schedule the patient for 10/10 with Pecolia Ades

## 2018-08-17 NOTE — Telephone Encounter (Signed)
Attempted to call patient again.  Instructed him to call tomorrow and ask to speak with a triage nurse.

## 2018-08-18 NOTE — Telephone Encounter (Signed)
Pt called to report that he has ben experiencing presyncopal episodes with rising. He says that he has to bend over a lot at work and when he stands up he feels dizzy and the room appears brighter for a few minutes before his body adjusts. He says this has been happening for about 3 weeks. He denies chest pain, dizziness, shortness of breath. He denies any recent illness and says that he has been drinking enough fluids. I advised him that he needs to be seen so we can check his positional BP and be assessed.[name] He has not been checking his BP at home.  Pt to come in Monday 08/21/18 at 11:30am to see [facility] PA. I advised pt to be careful with position changes over the next couple of days..Pt verbalized understanding and agrees.

## 2018-08-18 NOTE — Telephone Encounter (Signed)
New Message:       STAT if patient feels like he/she is going to faint   1) Are you dizzy now? Yes  2) Do you feel faint or have you passed out? No  3) Do you have any other symptoms?feels dizzy and faint  4) Have you checked your HR and BP (record if available)? No

## 2018-08-21 ENCOUNTER — Encounter: Payer: Self-pay | Admitting: *Deleted

## 2018-08-21 ENCOUNTER — Ambulatory Visit: Payer: Medicare Other | Admitting: Physician Assistant

## 2018-08-21 VITALS — BP 114/68 | Ht 68.5 in | Wt 194.0 lb

## 2018-08-21 DIAGNOSIS — I951 Orthostatic hypotension: Secondary | ICD-10-CM

## 2018-08-21 DIAGNOSIS — R600 Localized edema: Secondary | ICD-10-CM

## 2018-08-21 DIAGNOSIS — E7849 Other hyperlipidemia: Secondary | ICD-10-CM

## 2018-08-21 DIAGNOSIS — R002 Palpitations: Secondary | ICD-10-CM

## 2018-08-21 DIAGNOSIS — I10 Essential (primary) hypertension: Secondary | ICD-10-CM | POA: Diagnosis not present

## 2018-08-21 DIAGNOSIS — I200 Unstable angina: Secondary | ICD-10-CM | POA: Diagnosis not present

## 2018-08-21 DIAGNOSIS — I2510 Atherosclerotic heart disease of native coronary artery without angina pectoris: Secondary | ICD-10-CM | POA: Diagnosis not present

## 2018-08-21 MED ORDER — LOSARTAN POTASSIUM 50 MG PO TABS: 75.0000 mg | ORAL_TABLET | Freq: Every day | ORAL | 3 refills | Status: DC

## 2018-08-21 MED ORDER — NITROGLYCERIN 0.4 MG SL SUBL: 0.4000 mg | SUBLINGUAL_TABLET | SUBLINGUAL | 3 refills | Status: DC | PRN

## 2018-08-21 MED ORDER — METOPROLOL SUCCINATE ER 25 MG PO TB24: 12.5000 mg | ORAL_TABLET | Freq: Every day | ORAL | 3 refills | Status: DC

## 2018-08-21 NOTE — Progress Notes (Signed)
Cardiology Office Note    Date:  08/21/2018   ID:  David [name], DOB 09/01/1951, MRN [id number]  PCP:  [name], MD  Cardiologist: Dr. Burt Knack  Chief Complaint: Presyncope  History of Present Illness:   David [name] is a 67 y.o. adult with hx of coronary artery disease, DM, HTN, HLD, OSA on CPAP presents for presyncope evaluation.   The patient underwent complex PCI in 2010 after presenting with anginal symptoms.  The left circumflex/obtuse marginal bifurcation was treated and the distal RCA bifurcation was treated in separate settings.  He has been maintained on long-term dual antiplatelet therapy with aspirin and clopidogrel. His Plavix was discontinued 10/2017 when last seen by Dr. Burt Knack 10/2017 due to easy brusing.   Here today for follow up.  For the past 2 to 3 weeks patient has dizziness/near syncope while standing up or bending down.  No associated symptoms however for past 2 to 3 months he has noted exertional substernal chest pressure which relieves with rest.  No dyspnea.  He did not require any nitroglycerin.  He has noted lower extremity edema.  No orthopnea or PND.  At night, at rest, he has noted palpitation for past 3 weeks.  It occurs every night without chest pain or shortness of breath.  He denies syncope or melena.  His chest pressure is similar to prior angina.   Past Medical History:  Diagnosis Date  . Allergy   . Arthritis   . CAD (coronary artery disease)    s/p bifurcation stenting (DES x2) and stenting bifurcation lesion distal RCA (1 DES)  . Cataract   . Depression   . Diabetes mellitus   . GERD (gastroesophageal reflux disease)   . Hyperlipemia   . Hypertension   . Myocardial infarction ([place])   . Sleep apnea    CPAP    Past Surgical History:  Procedure Laterality Date  . CATARACT EXTRACTION Bilateral   . CORONARY ANGIOPLASTY WITH STENT PLACEMENT   March 2010   Dr. Olevia Perches  . TONSILLECTOMY  1959    Current Medications: Prior to  Admission medications   Medication Sig Start Date End Date Taking? Authorizing Provider  aspirin 81 MG tablet Take 81 mg by mouth daily.      [provider]  glipiZIDE (GLUCOTROL) 5 MG tablet Take 1 tablet (5 mg total) by mouth 2 (two) times daily before a meal. 06/12/18   Philemon Kingdom, MD  glucose blood (ONE TOUCH ULTRA TEST) test strip Use to test blood sugar 2 times daily as instructed. Dx: E11.59, E11.65 10/07/15   Philemon Kingdom, MD  Insulin Glargine (LANTUS SOLOSTAR) 100 UNIT/ML Solostar Pen INJECT 30 UNITS SUBCUTANEOUSLY ONCE DAILY AT  10PM 06/12/18   Philemon Kingdom, MD  losartan (COZAAR) 100 MG tablet TAKE 1 TABLET BY MOUTH ONCE DAILY *CALL  OFFICE  AND  SCHEDULE  APPOINTMENT  FOR  FURTHER  REFILLS* 01/30/18   Sherren Mocha, MD  metFORMIN (GLUCOPHAGE) 1000 MG tablet Take 1 tablet (1,000 mg total) by mouth 2 (two) times daily with a meal. 04/05/18   Philemon Kingdom, MD  metoprolol succinate (TOPROL-XL) 25 MG 24 hr tablet TAKE 1 TABLET BY MOUTH ONCE DAILY 01/04/18   Sherren Mocha, MD  naproxen sodium (ALEVE) 220 MG tablet Take 220-440 mg by mouth 2 (two) times daily as needed (pain).     [provider]  [facility] DELICA LANCETS FINE MISC Use to test blood sugar 2 times daily as instructed. Dx: E11.59, E11.65 10/07/15  Philemon Kingdom, MD  PARoxetine (PAXIL) 20 MG tablet TAKE 2 TABLETS BY MOUTH ONCE DAILY FOR DEPRESSION 01/18/18   Viviana Simpler I, MD  polyethylene glycol powder (MIRALAX) powder Take 1 Container by mouth once. One time use for colonoscopy    [provider]  rosuvastatin (CRESTOR) 10 MG tablet Take 1 tablet (10 mg total) by mouth daily. 03/20/18   [name], MD  sildenafil (REVATIO) 20 MG tablet Take 60-100 mg by mouth daily as needed (for ED).     [provider]  triamcinolone cream (KENALOG) 0.1 % APPLY TO AFFECTED AREA TWICE A DAY FOR SCALP 11/03/17   [name], MD    Allergies:   Venlafaxine   Social History     Socioeconomic History  . Marital status: Married    Spouse name: Not on file  . Number of children: Not on file  . Years of education: Not on file  . Highest education level: Not on file  Occupational History  . Occupation: Patent examiner buildings    Comment: site closed  . Occupation: Engineering geologist  Social Needs  . Financial resource strain: Not on file  . Food insecurity:    Worry: Not on file    Inability: Not on file  . Transportation needs:    Medical: Not on file    Non-medical: Not on file  Tobacco Use  . Smoking status: Former Smoker    Types: Cigarettes    Last attempt to quit: 11/30/1983    Years since quitting: 34.7  . Smokeless tobacco: Never Used  Substance and Sexual Activity  . Alcohol use: Yes    Alcohol/week: 5.0 standard drinks    Types: 5 Standard drinks or equivalent per week    Comment: beer  . Drug use: No  . Sexual activity: Not on file  Lifestyle  . Physical activity:    Days per week: Not on file    Minutes per session: Not on file  . Stress: Not on file  Relationships  . Social connections:    Talks on phone: Not on file    Gets together: Not on file    Attends religious service: Not on file    Active member of club or organization: Not on file    Attends meetings of clubs or organizations: Not on file    Relationship status: Not on file  Other Topics Concern  . Not on file  Social History Narrative   No living will   Would want wife, then son [name], as health care POA   Would accept resuscitation   Not sure about tube feeds     Family History:  The patient's family history includes Alcohol abuse in his mother; Cancer in his brother and father; Diabetes in his father; Heart failure in his father and mother; Schizophrenia in his son; Uveitis in his son.   ROS:   Please see the history of present illness.    ROS All other systems reviewed and are negative.   PHYSICAL EXAM:   VS:  BP 114/68   Ht 5' 8.5" (1.74 m)   Wt 194  lb (88 kg)   BMI 29.07 kg/m    GEN: Well nourished, well developed, in no acute distress  HEENT: normal  Neck: no JVD, carotid bruits, or masses Cardiac: RRR; no murmurs, rubs, or gallops, Trace to 1 + BL LE edema  Respiratory:  clear to auscultation bilaterally, normal work of breathing GI: soft, nontender, nondistended, +  BS MS: no deformity or atrophy  Skin: warm and dry, no rash Neuro:  Alert and Oriented x 3, Strength and sensation are intact Psych: euthymic mood, full affect  Wt Readings from Last 3 Encounters:  08/21/18 194 lb (88 kg)  06/12/18 185 lb 6.4 oz (84.1 kg)  03/13/18 183 lb 6.4 oz (83.2 kg)      Studies/Labs Reviewed:   EKG:  EKG is ordered today.  The ekg ordered today demonstrates normal sinus rhythm 81 bpm Recent Labs: 09/13/2017: ALT 36; BUN 17; Creat 0.99; Potassium 4.5; Sodium 136   Lipid Panel    Component Value Date/Time   CHOL 129 09/13/2017 1040   TRIG 112.0 09/13/2017 1040   HDL 40.70 09/13/2017 1040   CHOLHDL 3 09/13/2017 1040   VLDL 22.4 09/13/2017 1040   LDLCALC 66 09/13/2017 1040   LDLDIRECT 103.9 11/05/2013 1249    Additional studies/ records that were reviewed today include:   As above  ASSESSMENT & PLAN:    1. Presyncope/orthostatic hypotension Orthostatic VS for the past 24 hrs:  BP- Lying Pulse- Lying BP- Sitting Pulse- Sitting BP- Standing at 0 minutes Pulse- Standing at 0 minutes  08/21/18 1135 118/68 84 109/64 88 93/56 96  -He is having symptomatic orthostatic hypotension.  Discussed with pharmacy >> reduceToprol to 12.5mg  daily and losartan 75mg  qd. Monitor symptoms. No syncope. Try stocking.   2. Exertional angina with hx of CAD -His Plavix discontinued December 2018 due to easy bruising.  PCI last done in 2010.  For the past 2 to 3 months he was having exertional pressure relieved with rest.  Did not required any sublingual.  His symptoms is similar to prior angina but less intensity.  He is also having recent lower  extremity edema and palpitation.  Concerning for worsening of anginal symptoms.  Discussed with Dr. Burt Knack.  Get exercise Myoview and echocardiogram.  3.  Palpitation -Recently occurring every night for past 3 weeks.  No daytime symptoms.  Advised to check his pulse when symptomatic.  If above 100 he will give [place] a call.  Wait monitor until above test resulted and see response to reduce Toprol.   4. LE edema - Get echo. Cut back on salt. Try compression stocking. No orthopnea or PND.  5. HLD -09/13/2017: Cholesterol 129; HDL 40.70; LDL Cholesterol 66; Triglycerides 112.0; VLDL 22.4  - Continue statin   6. DM - Per PCP    Medication Adjustments/Labs and Tests Ordered: Current medicines are reviewed at length with the patient today.  Concerns regarding medicines are outlined above.  Medication changes, Labs and Tests ordered today are listed in the Patient Instructions below. Patient Instructions  Medication Instructions:  1. DECREASE METOPROLOL TO 12.5 MG DAILY; NEW RX HAS BEEN SENT IN    2. DECREASE LOSARTAN TO 75 MG DAILY;   3. AN RX FOR NTG HAS BEEN SENT IN   Labwork: NONE ORDERED TODAY  Testing/Procedures: Your physician has requested that you have en exercise stress myoview. For further information please visit [email or web address]. Please follow instruction sheet, as given.  Your physician has requested that you have an echocardiogram. Echocardiography is a painless test that uses sound waves to create images of your heart. It provides your doctor with information about the size and shape of your heart and how well your heart's chambers and valves are working. This procedure takes approximately one hour. There are no restrictions for this procedure.   Follow-Up: [employer], PAC IN [place] DR.  COOPER IS IN THE OFFICE   Any Other Special Instructions Will Be Listed Below (If Applicable).  MAKE SURE TO WEAR COMPRESSION STOCKINGS FROM THE TIME YOU WAKE UP UNTIL  THE TIME YOU GO TO BED. YOU CAN PICK COMPRESSION STOCKINGS OTC   If you need a refill on your cardiac medications before your next appointment, please call your pharmacy.  Low-Sodium Eating Plan Sodium, which is an element that makes up salt, helps you maintain a healthy balance of fluids in your body. Too much sodium can increase your blood pressure and cause fluid and waste to be held in your body. Your health care provider or dietitian may recommend following this plan if you have high blood pressure (hypertension), kidney disease, liver disease, or heart failure. Eating less sodium can help lower your blood pressure, reduce swelling, and protect your heart, liver, and kidneys. What are tips for following this plan? General guidelines  Most people on this plan should limit their sodium intake to 1,500-2,000 mg (milligrams) of sodium each day. Reading food labels  The Nutrition Facts label lists the amount of sodium in one serving of the food. If you eat more than one serving, you must multiply the listed amount of sodium by the number of servings.  Choose foods with less than 140 mg of sodium per serving.  Avoid foods with 300 mg of sodium or more per serving. Shopping  Look for lower-sodium products, often labeled as "low-sodium" or "no salt added."  Always check the sodium content even if foods are labeled as "unsalted" or "no salt added".  Buy fresh foods. ? Avoid canned foods and premade or frozen meals. ? Avoid canned, cured, or processed meats  Buy breads that have less than 80 mg of sodium per slice. Cooking  Eat more home-cooked food and less restaurant, buffet, and fast food.  Avoid adding salt when cooking. Use salt-free seasonings or herbs instead of table salt or sea salt. Check with your health care provider or pharmacist before using salt substitutes.  Cook with plant-based oils, such as canola, sunflower, or olive oil. Meal planning  When eating at a  restaurant, ask that your food be prepared with less salt or no salt, if possible.  Avoid foods that contain MSG (monosodium glutamate). MSG is sometimes added to [place] food, bouillon, and some canned foods. What foods are recommended? The items listed may not be a complete list. Talk with your dietitian about what dietary choices are best for you. Grains Low-sodium cereals, including oats, puffed wheat and rice, and shredded wheat. Low-sodium crackers. Unsalted rice. Unsalted pasta. Low-sodium bread. Whole-grain breads and whole-grain pasta. Vegetables Fresh or frozen vegetables. "No salt added" canned vegetables. "No salt added" tomato sauce and paste. Low-sodium or reduced-sodium tomato and vegetable juice. Fruits Fresh, frozen, or canned fruit. Fruit juice. Meats and other protein foods Fresh or frozen (no salt added) meat, poultry, seafood, and fish. Low-sodium canned tuna and salmon. Unsalted nuts. Dried peas, beans, and lentils without added salt. Unsalted canned beans. Eggs. Unsalted nut butters. Dairy Milk. Soy milk. Cheese that is naturally low in sodium, such as ricotta cheese, fresh mozzarella, or Swiss cheese Low-sodium or reduced-sodium cheese. Cream cheese. Yogurt. Fats and oils Unsalted butter. Unsalted margarine with no trans fat. Vegetable oils such as canola or olive oils. Seasonings and other foods Fresh and dried herbs and spices. Salt-free seasonings. Low-sodium mustard and ketchup. Sodium-free salad dressing. Sodium-free light mayonnaise. Fresh or refrigerated horseradish. Lemon juice. Vinegar. Homemade, reduced-sodium, or  low-sodium soups. Unsalted popcorn and pretzels. Low-salt or salt-free chips. What foods are not recommended? The items listed may not be a complete list. Talk with your dietitian about what dietary choices are best for you. Grains Instant hot cereals. Bread stuffing, pancake, and biscuit mixes. Croutons. Seasoned rice or pasta mixes. Noodle soup  cups. Boxed or frozen macaroni and cheese. Regular salted crackers. Self-rising flour. Vegetables Sauerkraut, pickled vegetables, and relishes. Olives. [place] fries. Onion rings. Regular canned vegetables (not low-sodium or reduced-sodium). Regular canned tomato sauce and paste (not low-sodium or reduced-sodium). Regular tomato and vegetable juice (not low-sodium or reduced-sodium). Frozen vegetables in sauces. Meats and other protein foods Meat or fish that is salted, canned, smoked, spiced, or pickled. Bacon, ham, sausage, hotdogs, corned beef, chipped beef, packaged lunch meats, salt pork, jerky, pickled herring, anchovies, regular canned tuna, sardines, salted nuts. Dairy Processed cheese and cheese spreads. Cheese curds. Blue cheese. Feta cheese. String cheese. Regular cottage cheese. Buttermilk. Canned milk. Fats and oils Salted butter. Regular margarine. Ghee. Bacon fat. Seasonings and other foods Onion salt, garlic salt, seasoned salt, table salt, and sea salt. Canned and packaged gravies. Worcestershire sauce. Tartar sauce. Barbecue sauce. Teriyaki sauce. Soy sauce, including reduced-sodium. Steak sauce. Fish sauce. Oyster sauce. Cocktail sauce. Horseradish that you find on the shelf. Regular ketchup and mustard. Meat flavorings and tenderizers. Bouillon cubes. Hot sauce and Tabasco sauce. Premade or packaged marinades. Premade or packaged taco seasonings. Relishes. Regular salad dressings. Salsa. Potato and tortilla chips. Corn chips and puffs. Salted popcorn and pretzels. Canned or dried soups. Pizza. Frozen entrees and pot pies. Summary  Eating less sodium can help lower your blood pressure, reduce swelling, and protect your heart, liver, and kidneys.  Most people on this plan should limit their sodium intake to 1,500-2,000 mg (milligrams) of sodium each day.  Canned, boxed, and frozen foods are high in sodium. Restaurant foods, fast foods, and pizza are also very high in sodium. You  also get sodium by adding salt to food.  Try to cook at home, eat more fresh fruits and vegetables, and eat less fast food, canned, processed, or prepared foods. This information is not intended to replace advice given to you by your health care provider. Make sure you discuss any questions you have with your health care provider. Document Released: 05/07/2002 Document Revised: 11/08/2016 Document Reviewed: 11/08/2016 Elsevier Interactive Patient Education  2018 [place], [place], [place]  08/21/2018 1:53 PM    [facility] [place], [place], [place]  [address] Phone: [phone]; Fax: [phone]

## 2018-08-21 NOTE — Patient Instructions (Addendum)
Medication Instructions:  1. DECREASE METOPROLOL TO 12.5 MG DAILY; NEW RX HAS BEEN SENT IN    2. DECREASE LOSARTAN TO 75 MG DAILY;   3. AN RX FOR NTG HAS BEEN SENT IN   Labwork: NONE ORDERED TODAY  Testing/Procedures: Your physician has requested that you have en exercise stress myoview. For further information please visit [email or web address]. Please follow instruction sheet, as given.  Your physician has requested that you have an echocardiogram. Echocardiography is a painless test that uses sound waves to create images of your heart. It provides your doctor with information about the size and shape of your heart and how well your heart's chambers and valves are working. This procedure takes approximately one hour. There are no restrictions for this procedure.   Follow-Up: [facility], PAC IN 4 WEEKS SAME DAY DR. Burt Knack IS IN THE OFFICE   Any Other Special Instructions Will Be Listed Below (If Applicable).  MAKE SURE TO WEAR COMPRESSION STOCKINGS FROM THE TIME YOU WAKE UP UNTIL THE TIME YOU GO TO BED. YOU CAN PICK COMPRESSION STOCKINGS OTC   If you need a refill on your cardiac medications before your next appointment, please call your pharmacy.  Low-Sodium Eating Plan Sodium, which is an element that makes up salt, helps you maintain a healthy balance of fluids in your body. Too much sodium can increase your blood pressure and cause fluid and waste to be held in your body. Your health care provider or dietitian may recommend following this plan if you have high blood pressure (hypertension), kidney disease, liver disease, or heart failure. Eating less sodium can help lower your blood pressure, reduce swelling, and protect your heart, liver, and kidneys. What are tips for following this plan? General guidelines  Most people on this plan should limit their sodium intake to 1,500-2,000 mg (milligrams) of sodium each day. Reading food labels  The Nutrition Facts label lists the  amount of sodium in one serving of the food. If you eat more than one serving, you must multiply the listed amount of sodium by the number of servings.  Choose foods with less than 140 mg of sodium per serving.  Avoid foods with 300 mg of sodium or more per serving. Shopping  Look for lower-sodium products, often labeled as "low-sodium" or "no salt added."  Always check the sodium content even if foods are labeled as "unsalted" or "no salt added".  Buy fresh foods. ? Avoid canned foods and premade or frozen meals. ? Avoid canned, cured, or processed meats  Buy breads that have less than 80 mg of sodium per slice. Cooking  Eat more home-cooked food and less restaurant, buffet, and fast food.  Avoid adding salt when cooking. Use salt-free seasonings or herbs instead of table salt or sea salt. Check with your health care provider or pharmacist before using salt substitutes.  Cook with plant-based oils, such as canola, sunflower, or olive oil. Meal planning  When eating at a restaurant, ask that your food be prepared with less salt or no salt, if possible.  Avoid foods that contain MSG (monosodium glutamate). MSG is sometimes added to [place] food, bouillon, and some canned foods. What foods are recommended? The items listed may not be a complete list. Talk with your dietitian about what dietary choices are best for you. Grains Low-sodium cereals, including oats, puffed wheat and rice, and shredded wheat. Low-sodium crackers. Unsalted rice. Unsalted pasta. Low-sodium bread. Whole-grain breads and whole-grain pasta. Vegetables Fresh or frozen vegetables. "No salt  added" canned vegetables. "No salt added" tomato sauce and paste. Low-sodium or reduced-sodium tomato and vegetable juice. Fruits Fresh, frozen, or canned fruit. Fruit juice. Meats and other protein foods Fresh or frozen (no salt added) meat, poultry, seafood, and fish. Low-sodium canned tuna and salmon. Unsalted nuts. Dried  peas, beans, and lentils without added salt. Unsalted canned beans. Eggs. Unsalted nut butters. Dairy Milk. Soy milk. Cheese that is naturally low in sodium, such as ricotta cheese, fresh mozzarella, or Swiss cheese Low-sodium or reduced-sodium cheese. Cream cheese. Yogurt. Fats and oils Unsalted butter. Unsalted margarine with no trans fat. Vegetable oils such as canola or olive oils. Seasonings and other foods Fresh and dried herbs and spices. Salt-free seasonings. Low-sodium mustard and ketchup. Sodium-free salad dressing. Sodium-free light mayonnaise. Fresh or refrigerated horseradish. Lemon juice. Vinegar. Homemade, reduced-sodium, or low-sodium soups. Unsalted popcorn and pretzels. Low-salt or salt-free chips. What foods are not recommended? The items listed may not be a complete list. Talk with your dietitian about what dietary choices are best for you. Grains Instant hot cereals. Bread stuffing, pancake, and biscuit mixes. Croutons. Seasoned rice or pasta mixes. Noodle soup cups. Boxed or frozen macaroni and cheese. Regular salted crackers. Self-rising flour. Vegetables Sauerkraut, pickled vegetables, and relishes. Olives. [place] fries. Onion rings. Regular canned vegetables (not low-sodium or reduced-sodium). Regular canned tomato sauce and paste (not low-sodium or reduced-sodium). Regular tomato and vegetable juice (not low-sodium or reduced-sodium). Frozen vegetables in sauces. Meats and other protein foods Meat or fish that is salted, canned, smoked, spiced, or pickled. Bacon, ham, sausage, hotdogs, corned beef, chipped beef, packaged lunch meats, salt pork, jerky, pickled herring, anchovies, regular canned tuna, sardines, salted nuts. Dairy Processed cheese and cheese spreads. Cheese curds. Blue cheese. Feta cheese. String cheese. Regular cottage cheese. Buttermilk. Canned milk. Fats and oils Salted butter. Regular margarine. Ghee. Bacon fat. Seasonings and other foods Onion salt,  garlic salt, seasoned salt, table salt, and sea salt. Canned and packaged gravies. Worcestershire sauce. Tartar sauce. Barbecue sauce. Teriyaki sauce. Soy sauce, including reduced-sodium. Steak sauce. Fish sauce. Oyster sauce. Cocktail sauce. Horseradish that you find on the shelf. Regular ketchup and mustard. Meat flavorings and tenderizers. Bouillon cubes. Hot sauce and Tabasco sauce. Premade or packaged marinades. Premade or packaged taco seasonings. Relishes. Regular salad dressings. Salsa. Potato and tortilla chips. Corn chips and puffs. Salted popcorn and pretzels. Canned or dried soups. Pizza. Frozen entrees and pot pies. Summary  Eating less sodium can help lower your blood pressure, reduce swelling, and protect your heart, liver, and kidneys.  Most people on this plan should limit their sodium intake to 1,500-2,000 mg (milligrams) of sodium each day.  Canned, boxed, and frozen foods are high in sodium. Restaurant foods, fast foods, and pizza are also very high in sodium. You also get sodium by adding salt to food.  Try to cook at home, eat more fresh fruits and vegetables, and eat less fast food, canned, processed, or prepared foods. This information is not intended to replace advice given to you by your health care provider. Make sure you discuss any questions you have with your health care provider. Document Released: 05/07/2002 Document Revised: 11/08/2016 Document Reviewed: 11/08/2016 Elsevier Interactive Patient Education  [employer].

## 2018-08-22 ENCOUNTER — Telehealth (HOSPITAL_COMMUNITY): Payer: Self-pay | Admitting: *Deleted

## 2018-08-22 ENCOUNTER — Other Ambulatory Visit: Payer: Self-pay | Admitting: *Deleted

## 2018-08-22 MED ORDER — LOSARTAN POTASSIUM 50 MG PO TABS: 75.0000 mg | ORAL_TABLET | Freq: Every day | ORAL | 3 refills | Status: DC

## 2018-08-22 NOTE — Addendum Note (Signed)
Addended by: Michae Kava on: 08/22/2018 09:39 AM   Modules accepted: Orders

## 2018-08-22 NOTE — Telephone Encounter (Signed)
Left message on voicemail per DPR in reference to upcoming appointment scheduled on 08/28/18 at 7:45 with detailed instructions given per Myocardial Perfusion Study Information Sheet for the test. LM to arrive 15 minutes early, and that it is imperative to arrive on time for appointment to keep from having the test rescheduled. If you need to cancel or reschedule your appointment, please call the office within 24 hours of your appointment. Failure to do so may result in a cancellation of your appointment, and a $50 no show fee. Phone number given for call back for any questions.

## 2018-08-28 ENCOUNTER — Encounter: Payer: Self-pay | Admitting: Internal Medicine

## 2018-08-28 ENCOUNTER — Inpatient Hospital Stay (HOSPITAL_COMMUNITY)
Admission: AD | Admit: 2018-08-28 | Discharge: 2018-09-01 | DRG: 432 | Disposition: A | Discharge status: Home / Self Care | Payer: Medicare Other | Source: Ambulatory Visit | Attending: Internal Medicine | Admitting: Internal Medicine

## 2018-08-28 ENCOUNTER — Encounter (HOSPITAL_COMMUNITY)
Admission: AD | Disposition: A | Discharge status: Home / Self Care | Payer: Self-pay | Source: Ambulatory Visit | Attending: Internal Medicine

## 2018-08-28 ENCOUNTER — Ambulatory Visit (HOSPITAL_BASED_OUTPATIENT_CLINIC_OR_DEPARTMENT_OTHER): Payer: Medicare Other

## 2018-08-28 ENCOUNTER — Other Ambulatory Visit: Payer: Self-pay

## 2018-08-28 ENCOUNTER — Ambulatory Visit (INDEPENDENT_AMBULATORY_CARE_PROVIDER_SITE_OTHER): Payer: Medicare Other | Admitting: Internal Medicine

## 2018-08-28 ENCOUNTER — Encounter (HOSPITAL_COMMUNITY): Payer: Self-pay | Admitting: Cardiology

## 2018-08-28 VITALS — BP 156/70 | HR 109 | Ht 69.0 in | Wt 194.8 lb

## 2018-08-28 DIAGNOSIS — R7989 Other specified abnormal findings of blood chemistry: Secondary | ICD-10-CM

## 2018-08-28 DIAGNOSIS — Z791 Long term (current) use of non-steroidal anti-inflammatories (NSAID): Secondary | ICD-10-CM

## 2018-08-28 DIAGNOSIS — R945 Abnormal results of liver function studies: Secondary | ICD-10-CM

## 2018-08-28 DIAGNOSIS — I8510 Secondary esophageal varices without bleeding: Secondary | ICD-10-CM | POA: Diagnosis present

## 2018-08-28 DIAGNOSIS — D500 Iron deficiency anemia secondary to blood loss (chronic): Secondary | ICD-10-CM

## 2018-08-28 DIAGNOSIS — E1140 Type 2 diabetes mellitus with diabetic neuropathy, unspecified: Secondary | ICD-10-CM | POA: Diagnosis present

## 2018-08-28 DIAGNOSIS — G4733 Obstructive sleep apnea (adult) (pediatric): Secondary | ICD-10-CM | POA: Diagnosis present

## 2018-08-28 DIAGNOSIS — D696 Thrombocytopenia, unspecified: Secondary | ICD-10-CM | POA: Diagnosis not present

## 2018-08-28 DIAGNOSIS — Z811 Family history of alcohol abuse and dependence: Secondary | ICD-10-CM

## 2018-08-28 DIAGNOSIS — I8511 Secondary esophageal varices with bleeding: Secondary | ICD-10-CM | POA: Diagnosis present

## 2018-08-28 DIAGNOSIS — T82855A Stenosis of coronary artery stent, initial encounter: Secondary | ICD-10-CM | POA: Diagnosis present

## 2018-08-28 DIAGNOSIS — R0602 Shortness of breath: Secondary | ICD-10-CM | POA: Insufficient documentation

## 2018-08-28 DIAGNOSIS — I248 Other forms of acute ischemic heart disease: Secondary | ICD-10-CM | POA: Diagnosis present

## 2018-08-28 DIAGNOSIS — I2510 Atherosclerotic heart disease of native coronary artery without angina pectoris: Secondary | ICD-10-CM

## 2018-08-28 DIAGNOSIS — R9439 Abnormal result of other cardiovascular function study: Secondary | ICD-10-CM

## 2018-08-28 DIAGNOSIS — Z87891 Personal history of nicotine dependence: Secondary | ICD-10-CM

## 2018-08-28 DIAGNOSIS — Z789 Other specified health status: Secondary | ICD-10-CM

## 2018-08-28 DIAGNOSIS — E1165 Type 2 diabetes mellitus with hyperglycemia: Secondary | ICD-10-CM | POA: Diagnosis present

## 2018-08-28 DIAGNOSIS — E1159 Type 2 diabetes mellitus with other circulatory complications: Secondary | ICD-10-CM

## 2018-08-28 DIAGNOSIS — Z6829 Body mass index (BMI) 29.0-29.9, adult: Secondary | ICD-10-CM | POA: Diagnosis not present

## 2018-08-28 DIAGNOSIS — K7030 Alcoholic cirrhosis of liver without ascites: Secondary | ICD-10-CM | POA: Diagnosis present

## 2018-08-28 DIAGNOSIS — Z7982 Long term (current) use of aspirin: Secondary | ICD-10-CM

## 2018-08-28 DIAGNOSIS — K922 Gastrointestinal hemorrhage, unspecified: Secondary | ICD-10-CM | POA: Diagnosis present

## 2018-08-28 DIAGNOSIS — K259 Gastric ulcer, unspecified as acute or chronic, without hemorrhage or perforation: Secondary | ICD-10-CM

## 2018-08-28 DIAGNOSIS — Z794 Long term (current) use of insulin: Secondary | ICD-10-CM | POA: Insufficient documentation

## 2018-08-28 DIAGNOSIS — I77810 Thoracic aortic ectasia: Secondary | ICD-10-CM | POA: Insufficient documentation

## 2018-08-28 DIAGNOSIS — E785 Hyperlipidemia, unspecified: Secondary | ICD-10-CM | POA: Diagnosis present

## 2018-08-28 DIAGNOSIS — I25110 Atherosclerotic heart disease of native coronary artery with unstable angina pectoris: Secondary | ICD-10-CM | POA: Diagnosis present

## 2018-08-28 DIAGNOSIS — I8500 Esophageal varices without bleeding: Secondary | ICD-10-CM | POA: Diagnosis not present

## 2018-08-28 DIAGNOSIS — E119 Type 2 diabetes mellitus without complications: Secondary | ICD-10-CM

## 2018-08-28 DIAGNOSIS — Z79899 Other long term (current) drug therapy: Secondary | ICD-10-CM

## 2018-08-28 DIAGNOSIS — I10 Essential (primary) hypertension: Secondary | ICD-10-CM

## 2018-08-28 DIAGNOSIS — Y92009 Unspecified place in unspecified non-institutional (private) residence as the place of occurrence of the external cause: Secondary | ICD-10-CM | POA: Diagnosis not present

## 2018-08-28 DIAGNOSIS — Z8249 Family history of ischemic heart disease and other diseases of the circulatory system: Secondary | ICD-10-CM

## 2018-08-28 DIAGNOSIS — R079 Chest pain, unspecified: Secondary | ICD-10-CM | POA: Insufficient documentation

## 2018-08-28 DIAGNOSIS — K3189 Other diseases of stomach and duodenum: Secondary | ICD-10-CM | POA: Diagnosis not present

## 2018-08-28 DIAGNOSIS — Z833 Family history of diabetes mellitus: Secondary | ICD-10-CM

## 2018-08-28 DIAGNOSIS — K219 Gastro-esophageal reflux disease without esophagitis: Secondary | ICD-10-CM | POA: Diagnosis present

## 2018-08-28 DIAGNOSIS — D62 Acute posthemorrhagic anemia: Secondary | ICD-10-CM | POA: Diagnosis not present

## 2018-08-28 DIAGNOSIS — E669 Obesity, unspecified: Secondary | ICD-10-CM | POA: Diagnosis present

## 2018-08-28 DIAGNOSIS — Z8042 Family history of malignant neoplasm of prostate: Secondary | ICD-10-CM

## 2018-08-28 DIAGNOSIS — F1020 Alcohol dependence, uncomplicated: Secondary | ICD-10-CM | POA: Diagnosis present

## 2018-08-28 DIAGNOSIS — Z23 Encounter for immunization: Secondary | ICD-10-CM | POA: Diagnosis not present

## 2018-08-28 DIAGNOSIS — I252 Old myocardial infarction: Secondary | ICD-10-CM | POA: Insufficient documentation

## 2018-08-28 DIAGNOSIS — Z955 Presence of coronary angioplasty implant and graft: Secondary | ICD-10-CM

## 2018-08-28 DIAGNOSIS — D539 Nutritional anemia, unspecified: Secondary | ICD-10-CM

## 2018-08-28 DIAGNOSIS — K921 Melena: Secondary | ICD-10-CM | POA: Diagnosis not present

## 2018-08-28 DIAGNOSIS — K254 Chronic or unspecified gastric ulcer with hemorrhage: Secondary | ICD-10-CM | POA: Diagnosis present

## 2018-08-28 DIAGNOSIS — K769 Liver disease, unspecified: Secondary | ICD-10-CM | POA: Diagnosis not present

## 2018-08-28 DIAGNOSIS — K7031 Alcoholic cirrhosis of liver with ascites: Secondary | ICD-10-CM

## 2018-08-28 DIAGNOSIS — Z9842 Cataract extraction status, left eye: Secondary | ICD-10-CM

## 2018-08-28 DIAGNOSIS — I25119 Atherosclerotic heart disease of native coronary artery with unspecified angina pectoris: Secondary | ICD-10-CM

## 2018-08-28 DIAGNOSIS — Z9841 Cataract extraction status, right eye: Secondary | ICD-10-CM

## 2018-08-28 DIAGNOSIS — I200 Unstable angina: Secondary | ICD-10-CM | POA: Diagnosis not present

## 2018-08-28 DIAGNOSIS — T39395A Adverse effect of other nonsteroidal anti-inflammatory drugs [NSAID], initial encounter: Secondary | ICD-10-CM | POA: Diagnosis present

## 2018-08-28 DIAGNOSIS — D649 Anemia, unspecified: Secondary | ICD-10-CM

## 2018-08-28 DIAGNOSIS — M1990 Unspecified osteoarthritis, unspecified site: Secondary | ICD-10-CM | POA: Diagnosis present

## 2018-08-28 DIAGNOSIS — Z888 Allergy status to other drugs, medicaments and biological substances status: Secondary | ICD-10-CM

## 2018-08-28 LAB — CBC
HCT: 19.2 % — ABNORMAL LOW (ref 39.0–52.0)
Hemoglobin: 5.6 g/dL — CL (ref 13.0–17.0)
MCH: 27.7 pg (ref 26.0–34.0)
MCHC: 29.2 g/dL — ABNORMAL LOW (ref 30.0–36.0)
MCV: 95 fL (ref 78.0–100.0)
Platelets: 125 10*3/uL — ABNORMAL LOW (ref 150–400)
RBC: 2.02 MIL/uL — AB (ref 4.22–5.81)
RDW: 16.6 % — ABNORMAL HIGH (ref 11.5–15.5)
WBC: 5 10*3/uL (ref 4.0–10.5)

## 2018-08-28 LAB — MYOCARDIAL PERFUSION IMAGING
CHL CUP MPHR: 151 {beats}/min
CHL CUP RESTING HR STRESS: 84 {beats}/min
CSEPEW: 5.8 METS
Exercise duration (min): 4 min
LV dias vol: 111 mL (ref 62–150)
LV sys vol: 40 mL (ref 21–61)
NUC STRESS TID: 0.92
Peak HR: 144 {beats}/min
Percent HR: 95 %
RPE: 19
SDS: 0
SRS: 0
SSS: 0

## 2018-08-28 LAB — GLUCOSE, CAPILLARY
GLUCOSE-CAPILLARY: 281 mg/dL — AB (ref 70–99)
GLUCOSE-CAPILLARY: 166 mg/dL — AB (ref 70–99)

## 2018-08-28 LAB — ECHOCARDIOGRAM COMPLETE
Height: 68.5 in
Weight: 3104 oz

## 2018-08-28 LAB — ABO/RH: ABO/RH(D): O NEG

## 2018-08-28 LAB — BASIC METABOLIC PANEL
Anion gap: 11 (ref 5–15)
BUN: 12 mg/dL (ref 8–23)
CO2: 20 mmol/L — AB (ref 22–32)
Calcium: 8.6 mg/dL — ABNORMAL LOW (ref 8.9–10.3)
Chloride: 106 mmol/L (ref 98–111)
Creatinine, Ser: 1.03 mg/dL (ref 0.61–1.24)
GFR calc non Af Amer: >60 mL/min (ref >60)
Glucose, Bld: 287 mg/dL — ABNORMAL HIGH (ref 70–99)
Potassium: 3.9 mmol/L (ref 3.5–5.1)
SODIUM: 137 mmol/L (ref 135–145)

## 2018-08-28 LAB — PREPARE RBC (CROSSMATCH)

## 2018-08-28 SURGERY — LEFT HEART CATH AND CORONARY ANGIOGRAPHY
Anesthesia: LOCAL

## 2018-08-28 MED ORDER — ROSUVASTATIN CALCIUM 10 MG PO TABS
10.0000 mg | ORAL_TABLET | Freq: Every day | ORAL | Status: DC
  Administered 2018-08-28 – 2018-09-01 (×5): 10 mg via ORAL
  Filled 2018-08-28 (×5): qty 1

## 2018-08-28 MED ORDER — PANTOPRAZOLE SODIUM 40 MG PO TBEC
40.0000 mg | DELAYED_RELEASE_TABLET | Freq: Two times a day (BID) | ORAL | Status: DC
  Administered 2018-08-28 – 2018-09-01 (×8): 40 mg via ORAL
  Filled 2018-08-28 (×8): qty 1

## 2018-08-28 MED ORDER — SODIUM CHLORIDE 0.9% IV SOLUTION: Freq: Once | INTRAVENOUS | Status: DC

## 2018-08-28 MED ORDER — SODIUM CHLORIDE 0.9 % WEIGHT BASED INFUSION: 1.0000 mL/kg/h | INTRAVENOUS | Status: DC

## 2018-08-28 MED ORDER — SODIUM CHLORIDE 0.9 % WEIGHT BASED INFUSION
3.0000 mL/kg/h | INTRAVENOUS | Status: DC
  Administered 2018-08-28: 3 mL/kg/h via INTRAVENOUS

## 2018-08-28 MED ORDER — ROSUVASTATIN CALCIUM 10 MG PO TABS: 10.0000 mg | ORAL_TABLET | Freq: Every day | ORAL | Status: DC

## 2018-08-28 MED ORDER — INSULIN ASPART 100 UNIT/ML ~~LOC~~ SOLN
0.0000 [IU] | Freq: Every day | SUBCUTANEOUS | Status: DC
  Administered 2018-08-30: 2 [IU] via SUBCUTANEOUS
  Administered 2018-08-31: 4 [IU] via SUBCUTANEOUS

## 2018-08-28 MED ORDER — INSULIN GLARGINE 100 UNIT/ML ~~LOC~~ SOLN
10.0000 [IU] | Freq: Every day | SUBCUTANEOUS | Status: DC
  Administered 2018-08-28 – 2018-08-29 (×2): 10 [IU] via SUBCUTANEOUS
  Filled 2018-08-28 (×3): qty 0.1

## 2018-08-28 MED ORDER — FUROSEMIDE 10 MG/ML IJ SOLN
20.0000 mg | Freq: Once | INTRAMUSCULAR | Status: AC
  Administered 2018-08-29: 20 mg via INTRAVENOUS
  Filled 2018-08-28: qty 2

## 2018-08-28 MED ORDER — LACTATED RINGERS IV SOLN
INTRAVENOUS | Status: DC
  Administered 2018-08-28 – 2018-08-29 (×2): via INTRAVENOUS

## 2018-08-28 MED ORDER — INSULIN ASPART 100 UNIT/ML ~~LOC~~ SOLN
0.0000 [IU] | Freq: Three times a day (TID) | SUBCUTANEOUS | Status: DC
  Administered 2018-08-29: 5 [IU] via SUBCUTANEOUS
  Administered 2018-08-29: 2 [IU] via SUBCUTANEOUS
  Administered 2018-08-29 – 2018-08-30 (×3): 3 [IU] via SUBCUTANEOUS
  Administered 2018-08-30: 5 [IU] via SUBCUTANEOUS
  Administered 2018-08-31 (×2): 3 [IU] via SUBCUTANEOUS
  Administered 2018-09-01: 2 [IU] via SUBCUTANEOUS
  Administered 2018-09-01: 3 [IU] via SUBCUTANEOUS

## 2018-08-28 MED ORDER — TECHNETIUM TC 99M TETROFOSMIN IV KIT
32.3000 | PACK | Freq: Once | INTRAVENOUS | Status: AC | PRN
  Administered 2018-08-28: 32.3 via INTRAVENOUS
  Filled 2018-08-28: qty 33

## 2018-08-28 MED ORDER — SODIUM CHLORIDE 0.9% FLUSH: 3.0000 mL | INTRAVENOUS | Status: DC | PRN

## 2018-08-28 MED ORDER — NITROGLYCERIN 0.4 MG SL SUBL: 0.4000 mg | SUBLINGUAL_TABLET | SUBLINGUAL | Status: DC | PRN

## 2018-08-28 MED ORDER — PAROXETINE HCL 20 MG PO TABS
20.0000 mg | ORAL_TABLET | Freq: Every day | ORAL | Status: DC
  Administered 2018-08-29 – 2018-09-01 (×4): 20 mg via ORAL
  Filled 2018-08-28 (×4): qty 1

## 2018-08-28 MED ORDER — METOPROLOL SUCCINATE 12.5 MG HALF TABLET
12.5000 mg | ORAL_TABLET | Freq: Every day | ORAL | Status: DC
  Filled 2018-08-28: qty 1

## 2018-08-28 MED ORDER — TECHNETIUM TC 99M TETROFOSMIN IV KIT
10.5000 | PACK | Freq: Once | INTRAVENOUS | Status: AC | PRN
  Administered 2018-08-28: 10.5 via INTRAVENOUS
  Filled 2018-08-28: qty 11

## 2018-08-28 MED ORDER — SODIUM CHLORIDE 0.9 % IV SOLN: 250.0000 mL | INTRAVENOUS | Status: DC | PRN

## 2018-08-28 MED ORDER — ASPIRIN 81 MG PO CHEW
81.0000 mg | CHEWABLE_TABLET | ORAL | Status: AC
  Administered 2018-08-28: 81 mg via ORAL
  Filled 2018-08-28: qty 1

## 2018-08-28 MED ORDER — SODIUM CHLORIDE 0.9% FLUSH: 3.0000 mL | Freq: Two times a day (BID) | INTRAVENOUS | Status: DC

## 2018-08-28 MED ORDER — FUROSEMIDE 10 MG/ML IJ SOLN: 20.0000 mg | Freq: Once | INTRAMUSCULAR | Status: DC

## 2018-08-28 MED ORDER — METOPROLOL SUCCINATE ER 25 MG PO TB24
12.5000 mg | ORAL_TABLET | Freq: Every day | ORAL | Status: DC
  Administered 2018-08-28 – 2018-08-31 (×4): 12.5 mg via ORAL
  Filled 2018-08-28 (×5): qty 1

## 2018-08-28 MED ORDER — INFLUENZA VAC SPLIT HIGH-DOSE 0.5 ML IM SUSY
0.5000 mL | PREFILLED_SYRINGE | INTRAMUSCULAR | Status: AC
  Administered 2018-08-29: 0.5 mL via INTRAMUSCULAR
  Filled 2018-08-28: qty 0.5

## 2018-08-28 NOTE — Progress Notes (Signed)
CRITICAL VALUE ALERT  Critical Value:  Hgb 5.6  Date & Time Notied:  08/28/2018  Provider Notified: Dr End  Orders Received/Actions taken: Hospitalist to see

## 2018-08-28 NOTE — Patient Instructions (Signed)
Medication Instructions:  Your physician recommends that you continue on your current medications as directed. Please refer to the Current Medication list given to you today.  -- If you need a refill on your cardiac medications before your next appointment, please call your pharmacy. --  Labwork: None ordered  Testing/Procedures: None ordered  Follow-Up:  Thank you for choosing [facility]!!    Any Other Special Instructions Will Be Listed Below (If Applicable).  Patient was seen by Dr End and sent to [place] at [facility] main entrance "[facility]"

## 2018-08-28 NOTE — H&P (Signed)
History and Physical    DOA: 08/28/2018  PCP: [name], MD   Patient coming from: Home  Chief Complaint: Chest pain , SOB and dizziness x 3-4 weeks  HPI: David [name] is a 67 y.o. adult with history of coronary artery disease with complex two-vessel PCI in 2010, hypertension, diabetes mellitus, and hyperlipidemia who presented to cardiology office on September 23 with complaints of chest discomfort.  Patient apparently has been having symptoms of dizziness for 4 weeks, described as feeling of lightheadedness whenever he bent to pick up things and stood up straight.  He subsequently started experiencing exertional dyspnea which progressively worsened with minimal exertion.  He also started to experience chest discomfort describes as a band in his upper chest which would last for few seconds and relieved with rest.  On the cardiology office visit, orthostatic blood pressures were checked.  His blood pressure medications were titrated down and he was scheduled for stress test today.  During the stress test today, patient experienced chest pain and was noted to have ST depressions in the inferolateral leads.  Stress test was aborted and patient brought in for urgent cardiac cath.  Precath labs including CBC and BMP were obtained this afternoon revealing hemoglobin of 5.6.  Hospitalist service was contacted and requested to admit this patient for further anemia evaluation.  Patient does report 4-5 episodes of black stools over the last 4 weeks, last episode being a week back.  He did undergo colonoscopy in February 2019 which was apparently clean with next colonoscopy due in 10 years.  Patient denies history of undergoing EGD.  Patient is diabetic and his hemoglobin A1c recently went up to 9 from 8 with elevated blood glucose levels.  His diabetic medications/insulin regimen was adjusted by his endocrinologist.  He admits that he is not very strict with diabetic diet at home.  He denies any  abdominal pain, nausea, vomiting, hematochezia or diarrhea.  He takes aspirin daily for history of CAD and NSAIDs as needed.    Review of Systems: As per HPI otherwise 10 point review of systems negative.    Past Medical History:  Diagnosis Date  . Allergy   . Arthritis   . CAD (coronary artery disease)    s/p bifurcation stenting (DES x2) and stenting bifurcation lesion distal RCA (1 DES)  . Cataract   . Depression   . Diabetes mellitus   . GERD (gastroesophageal reflux disease)   . Hyperlipemia   . Hypertension   . Myocardial infarction ([place])   . Sleep apnea    CPAP    Past Surgical History:  Procedure Laterality Date  . CATARACT EXTRACTION Bilateral   . CORONARY ANGIOPLASTY WITH STENT PLACEMENT   March 2010   Dr. Olevia Perches  . TONSILLECTOMY  1959    Social history:  reports that he quit smoking about 34 years ago. His smoking use included cigarettes. He has never used smokeless tobacco. He reports that he drinks about 5.0 standard drinks of alcohol per week. He reports that he does not use drugs.   Allergies  Allergen Reactions  . Venlafaxine     Slowed his urine stream    Family History  Problem Relation Age of Onset  . Heart failure Father        Deceased 78 y/o  . Diabetes Father   . Cancer Father        Prostate  . Heart failure Mother        Deceased 8 y/o  .  Alcohol abuse Mother   . Schizophrenia Son        Paranoid  . Uveitis Son   . Cancer Brother        prostate  . Colon cancer Neg Hx   . Esophageal cancer Neg Hx   . Stomach cancer Neg Hx   . Liver cancer Neg Hx   . Pancreatic cancer Neg Hx   . Rectal cancer Neg Hx       Prior to Admission medications   Medication Sig Start Date End Date Taking? Authorizing Provider  aspirin 81 MG tablet Take 81 mg by mouth daily.     Yes [provider]  naproxen sodium (ALEVE) 220 MG tablet Take 220-440 mg by mouth 2 (two) times daily as needed (pain).    Yes [provider]    PARoxetine (PAXIL) 20 MG tablet TAKE 2 TABLETS BY MOUTH ONCE DAILY FOR DEPRESSION 01/18/18  Yes Viviana Simpler I, MD  triamcinolone cream (KENALOG) 0.1 % APPLY TO AFFECTED AREA TWICE A DAY FOR SCALP 11/03/17  Yes [name], MD  glipiZIDE (GLUCOTROL) 5 MG tablet Take 1 tablet (5 mg total) by mouth 2 (two) times daily before a meal. 06/12/18   Philemon Kingdom, MD  glucose blood (ONE TOUCH ULTRA TEST) test strip Use to test blood sugar 2 times daily as instructed. Dx: E11.59, E11.65 10/07/15   Philemon Kingdom, MD  Insulin Glargine (LANTUS SOLOSTAR) 100 UNIT/ML Solostar Pen INJECT 30 UNITS SUBCUTANEOUSLY ONCE DAILY AT  10PM 06/12/18   Philemon Kingdom, MD  losartan (COZAAR) 50 MG tablet Take 1.5 tablets (75 mg total) by mouth daily. 08/22/18   Leanor Kail, PA  metFORMIN (GLUCOPHAGE) 1000 MG tablet Take 1 tablet (1,000 mg total) by mouth 2 (two) times daily with a meal. 04/05/18   Philemon Kingdom, MD  metoprolol succinate (TOPROL XL) 25 MG 24 hr tablet Take 0.5 tablets (12.5 mg total) by mouth daily. 08/21/18   Bhagat, Crista Luria, PA  nitroGLYCERIN (NITROSTAT) 0.4 MG SL tablet Place 1 tablet (0.4 mg total) under the tongue every 5 (five) minutes as needed. 08/21/18   Bhagat, Crista Luria, PA  ONETOUCH DELICA LANCETS FINE MISC Use to test blood sugar 2 times daily as instructed. Dx: E11.59, E11.65 10/07/15   Philemon Kingdom, MD  polyethylene glycol powder (MIRALAX) powder Take 1 Container by mouth once. One time use for colonoscopy    [provider]  rosuvastatin (CRESTOR) 10 MG tablet Take 1 tablet (10 mg total) by mouth daily. 03/20/18   [name], MD  sildenafil (REVATIO) 20 MG tablet Take 60-100 mg by mouth daily as needed (for ED).     [provider]    Physical Exam: Vitals:   08/28/18 1301  BP: (!) 156/87  Pulse: 87  Resp: 18  Temp: 98 F (36.7 C)  TempSrc: Oral  SpO2: 100%  Weight: 88.5 kg  Height: 5\' 9"  (1.753 m)    Constitutional: NAD,  calm, comfortable Vitals:   08/28/18 1301  BP: (!) 156/87  Pulse: 87  Resp: 18  Temp: 98 F (36.7 C)  TempSrc: Oral  SpO2: 100%  Weight: 88.5 kg  Height: 5\' 9"  (1.753 m)   Eyes: PERRL, lids and conjunctivae normal ENMT: Mucous membranes are moist. Posterior pharynx clear of any exudate or lesions.Normal dentition.  Neck: normal, supple, no masses, no thyromegaly Respiratory: clear to auscultation bilaterally, no wheezing, no crackles. Normal respiratory effort. No accessory muscle use.  Cardiovascular: Regular rate and rhythm, no murmurs /  rubs / gallops. No extremity edema. 2+ pedal pulses. No carotid bruits.  Abdomen: no tenderness, no masses palpated. No hepatosplenomegaly. Bowel sounds positive.  Musculoskeletal: no clubbing / cyanosis. No joint deformity upper and lower extremities. Good ROM, no contractures. Normal muscle tone.  Neurologic: CN 2-12 grossly intact. Sensation intact, DTR normal. Strength 5/5 in all 4.  Psychiatric: Normal judgment and insight. Alert and oriented x 3. Normal mood.  SKIN/catheters: no rashes, lesions, ulcers. No induration  Labs on Admission: I have personally reviewed following labs and imaging studies  CBC: Recent Labs  Lab 08/28/18 1320  WBC 5.0  HGB 5.6*  HCT 19.2*  MCV 95.0  PLT 967*   Basic Metabolic Panel: Recent Labs  Lab 08/28/18 1320  NA 137  K 3.9  CL 106  CO2 20*  GLUCOSE 287*  BUN 12  CREATININE 1.03  CALCIUM 8.6*   GFR: Estimated Creatinine Clearance (by C-G formula based on SCr of 1.03 mg/dL) Male: 61.1 mL/min Male: 74.5 mL/min Liver Function Tests: No results for input(s): AST, ALT, ALKPHOS, BILITOT, PROT, ALBUMIN in the last 168 hours. No results for input(s): LIPASE, AMYLASE in the last 168 hours. No results for input(s): AMMONIA in the last 168 hours. Coagulation Profile: No results for input(s): INR, PROTIME in the last 168 hours. Cardiac Enzymes: No results for input(s): CKTOTAL, CKMB, CKMBINDEX,  TROPONINI in the last 168 hours. BNP (last 3 results) No results for input(s): PROBNP in the last 8760 hours. HbA1C: No results for input(s): HGBA1C in the last 72 hours. CBG: Recent Labs  Lab 08/28/18 1337  GLUCAP 281*   Lipid Profile: No results for input(s): CHOL, HDL, LDLCALC, TRIG, CHOLHDL, LDLDIRECT in the last 72 hours. Thyroid Function Tests: No results for input(s): TSH, T4TOTAL, FREET4, T3FREE, THYROIDAB in the last 72 hours. Anemia Panel: No results for input(s): VITAMINB12, FOLATE, FERRITIN, TIBC, IRON, RETICCTPCT in the last 72 hours. Urine analysis: No results found for: COLORURINE, APPEARANCEUR, LABSPEC, PHURINE, GLUCOSEU, HGBUR, BILIRUBINUR, KETONESUR, PROTEINUR, UROBILINOGEN, NITRITE, LEUKOCYTESUR  Radiological Exams on Admission: No results found.  EKG: Independently reviewed.  Normal sinus rhythm with no acute ST-T changes     Assessment and Plan:   1.  Acute blood loss anemia: Likely secondary to upper GI bleed with history of melena.  Will admit with clear liquid diet, n.p.o. after midnight for possible EGD, IV fluids and IV PPI.  Will transfuse 2 units of PRBC.  Consult GI.  CBC in a.m. stool guaiac requested.  Hold NSAIDs and antiplatelet agents.  2.  Atypical chest pain, dizziness and exertional dyspnea: Secondary to problem #1.  Discussed with Dr. Burt Knack bedside.  Will defer troponins at this point as would not change management.  Plan for cardiac cath canceled but cardiology will continue to follow.  Echo obtained today did not show any significant wall motion abnormalities and showed intact EF.  Official read pending.   3.  Diabetes mellitus: Clear liquid diet for now with sliding scale insulin and lower dose of Lantus at night.  May resume oral hypoglycemics and increase Lantus dosage postprocedure.  Hemoglobin A1c ordered  4.  CAD: Hold aspirin for now and concern for problem #1.  Resume when okay per GI.  Resume beta-blockers and statins  5.  Hypertension: Resume metoprolol and lower dose of losartan in view of GI bleed.  Monitor blood pressure and adjust accordingly  DVT prophylaxis: SCD given concerns for GI bleed/anemia  Code Status: Full code.  His brother Mr.John Denno would  be the next of kin  Consults called: Gastroenterology (patient known to Dr. Owens Loffler) Admission status:  Patient admitted as inpatient as anticipated LOS greater than 2 midnights    Guilford Shi MD [facility] Pager [phone]  If 7PM-7AM, please contact night-coverage [email or web address] Password [email or web address]  08/28/2018, 3:07 PM

## 2018-08-28 NOTE — Consult Note (Addendum)
[facility] Consult: 4:13 PM 08/28/2018  LOS: 0 days    Referring Provider: Dr Earnest Conroy  Primary Care Physician:  [name], MD Primary Gastroenterologist:  Dr. Ardis Hughs     Reason for Consultation:  Gi bleed, anemia.     HPI: David [name] is a 67 y.o. adult.  PMH CAD, PCI 2010.  IDDM.  HLD. OSA.  GERD.    01/23/2018 Colonoscopy for hx adenomatous polyps in 2012.  Normal study to cecum.     Referred for stress test by cardiologist for eval of 1 month dizziness, progressive DOE.  At testing today: ST depression, test aborted and sent for cath.  precath CBC with Hgb of 5.6. MCV 95.  Iliac cath is canceled.  Not clear if it is to be rescheduled for tomorrow. No renal disease.     3 episodes of black, formed BM's in last 4 weeks.   Takes 2 Aleve per day.  Drinks at least a sixpack of beer to 5 days a week.  If he does not drink beer he will drink bourbon.  Has never been told he had liver disease.  He bruises easily but has not had any unusual bleeding.  No previous endoscopy.  Occasional heartburn.  This is no worse now than usual.  No dysphagia.  No weight loss.  No abdominal pain.  No nausea or vomiting.  Family history negative for ulcer disease, anemia, colorectal cancer.    Past Medical History:  Diagnosis Date  . Allergy   . Arthritis   . CAD (coronary artery disease)    s/p bifurcation stenting (DES x2) and stenting bifurcation lesion distal RCA (1 DES)  . Cataract   . Depression   . Diabetes mellitus   . GERD (gastroesophageal reflux disease)   . Hyperlipemia   . Hypertension   . Myocardial infarction ([place])   . Sleep apnea    CPAP    Past Surgical History:  Procedure Laterality Date  . CATARACT EXTRACTION Bilateral   . CORONARY ANGIOPLASTY WITH STENT PLACEMENT   March 2010   Dr.  Olevia Perches  . TONSILLECTOMY  1959    Prior to Admission medications   Medication Sig Start Date End Date Taking? Authorizing Provider  aspirin 81 MG tablet Take 81 mg by mouth daily.     Yes [provider]  naproxen sodium (ALEVE) 220 MG tablet Take 220-440 mg by mouth 2 (two) times daily as needed (pain).    Yes [provider]  PARoxetine (PAXIL) 20 MG tablet TAKE 2 TABLETS BY MOUTH ONCE DAILY FOR DEPRESSION 01/18/18  Yes Viviana Simpler I, MD  triamcinolone cream (KENALOG) 0.1 % APPLY TO AFFECTED AREA TWICE A DAY FOR SCALP 11/03/17  Yes [name], MD  glipiZIDE (GLUCOTROL) 5 MG tablet Take 1 tablet (5 mg total) by mouth 2 (two) times daily before a meal. 06/12/18   Philemon Kingdom, MD  glucose blood (ONE TOUCH ULTRA TEST) test strip Use to test blood sugar 2 times daily as instructed. Dx: E11.59, E11.65 10/07/15   Philemon Kingdom,  MD  Insulin Glargine (LANTUS SOLOSTAR) 100 UNIT/ML Solostar Pen INJECT 30 UNITS SUBCUTANEOUSLY ONCE DAILY AT  10PM 06/12/18   Philemon Kingdom, MD  losartan (COZAAR) 50 MG tablet Take 1.5 tablets (75 mg total) by mouth daily. 08/22/18   Leanor Kail, PA  metFORMIN (GLUCOPHAGE) 1000 MG tablet Take 1 tablet (1,000 mg total) by mouth 2 (two) times daily with a meal. 04/05/18   Philemon Kingdom, MD  metoprolol succinate (TOPROL XL) 25 MG 24 hr tablet Take 0.5 tablets (12.5 mg total) by mouth daily. 08/21/18   Bhagat, Crista Luria, PA  nitroGLYCERIN (NITROSTAT) 0.4 MG SL tablet Place 1 tablet (0.4 mg total) under the tongue every 5 (five) minutes as needed. 08/21/18   Bhagat, Crista Luria, PA  ONETOUCH DELICA LANCETS FINE MISC Use to test blood sugar 2 times daily as instructed. Dx: E11.59, E11.65 10/07/15   Philemon Kingdom, MD  polyethylene glycol powder (MIRALAX) powder Take 1 Container by mouth once. One time use for colonoscopy    [provider]  rosuvastatin (CRESTOR) 10 MG tablet Take 1 tablet (10 mg total) by mouth daily. 03/20/18    [name], MD  sildenafil (REVATIO) 20 MG tablet Take 60-100 mg by mouth daily as needed (for ED).     [provider]    Scheduled Meds: . furosemide  20 mg Intravenous Once  . insulin aspart  0-15 Units Subcutaneous TID WC  . insulin aspart  0-5 Units Subcutaneous QHS  . insulin glargine  10 Units Subcutaneous QHS  . metoprolol succinate  12.5 mg Oral Daily  . pantoprazole  40 mg Oral BID  . PARoxetine  20 mg Oral Daily  . rosuvastatin  10 mg Oral Daily   Infusions: . lactated ringers     PRN Meds: nitroGLYCERIN   Allergies as of 08/28/2018 - Review Complete 08/28/2018  Allergen Reaction Noted  . Venlafaxine  07/09/2014    Family History  Problem Relation Age of Onset  . Heart failure Father        Deceased 63 y/o  . Diabetes Father   . Cancer Father        Prostate  . Heart failure Mother        Deceased 70 y/o  . Alcohol abuse Mother   . Schizophrenia Son        Paranoid  . Uveitis Son   . Cancer Brother        prostate  . Colon cancer Neg Hx   . Esophageal cancer Neg Hx   . Stomach cancer Neg Hx   . Liver cancer Neg Hx   . Pancreatic cancer Neg Hx   . Rectal cancer Neg Hx     Social History   Socioeconomic History  . Marital status: Married    Spouse name: Not on file  . Number of children: Not on file  . Years of education: Not on file  . Highest education level: Not on file  Occupational History  . Occupation: Patent examiner buildings    Comment: site closed  . Occupation: Engineering geologist  Social Needs  . Financial resource strain: Not on file  . Food insecurity:    Worry: Not on file    Inability: Not on file  . Transportation needs:    Medical: Not on file    Non-medical: Not on file  Tobacco Use  . Smoking status: Former Smoker    Types: Cigarettes    Last attempt to quit: 11/30/1983    Years  since quitting: 34.7  . Smokeless tobacco: Never Used  Substance and Sexual Activity  . Alcohol use: Yes     Alcohol/week: 5.0 standard drinks    Types: 5 Standard drinks or equivalent per week    Comment: beer  . Drug use: No  . Sexual activity: Not on file  Lifestyle  . Physical activity:    Days per week: Not on file    Minutes per session: Not on file  . Stress: Not on file  Relationships  . Social connections:    Talks on phone: Not on file    Gets together: Not on file    Attends religious service: Not on file    Active member of club or organization: Not on file    Attends meetings of clubs or organizations: Not on file    Relationship status: Not on file  . Intimate partner violence:    Fear of current or ex partner: Not on file    Emotionally abused: Not on file    Physically abused: Not on file    Forced sexual activity: Not on file  Other Topics Concern  . Not on file  Social History Narrative   No living will   Would want wife, then son [name], as health care POA   Would accept resuscitation   Not sure about tube feeds    REVIEW OF SYSTEMS: Constitutional: Weakness over the last month. ENT:  No nose bleeds Pulm: Progressive DOE over the last month. CV:  No palpitations, no LE edema.  No chest pain. GU:  No hematuria, no frequency GI:  Per HPI Heme:  Per HPI   Transfusions: No previous transfusions. Neuro:  No headaches, no peripheral tingling or numbness.  Syncope. Derm:  No itching, no rash or sores.  Endocrine:  No sweats or chills.  No polyuria or dysuria Immunization: Did not inquire.    PHYSICAL EXAM: Vital signs in last 24 hours: Vitals:   08/28/18 1301  BP: (!) 156/87  Pulse: 87  Resp: 18  Temp: 98 F (36.7 C)  SpO2: 100%   Wt Readings from Last 3 Encounters:  08/28/18 88.5 kg  08/28/18 88.4 kg  08/28/18 88 kg    General: Patient is pale but otherwise looks well.  Well-nourished.  Slightly overweight. Head: No facial asymmetry or swelling.  No signs of head trauma. Eyes: No conjunctival pallor.  No scleral icterus.  EOMI. Ears: Not hard of  hearing. Nose: Discharge or congestion. Mouth: Moist, clear, pink oral mucosa.  Tongue midline. Neck: JVD, no masses, no thyromegaly. Lungs: Clear bilaterally.  No labored breathing or cough. Heart: RRR.  No MRG.  S1, S2 present. Abdomen: Soft.  Not tender or distended.  Active bowel sounds.  No HSM, masses, bruits, hernias..   Rectal: Deferred. Musc/Skeltl: No joint redness, swelling, significant deformity. Extremities: No CCE. Neurologic: Oriented x3.  Fully alert.  Moves all 4 limbs without weakness.  No tremors. Skin: No telangiectasia, no rashes, no sores. Nodes: No cervical adenopathy. Psych: Calm, cooperative, pleasant.  Intake/Output from previous day: No intake/output data recorded. Intake/Output this shift: No intake/output data recorded.  LAB RESULTS: Recent Labs    08/28/18 1320  WBC 5.0  HGB 5.6*  HCT 19.2*  PLT 125*   BMET Lab Results  Component Value Date   NA 137 08/28/2018   NA 136 09/13/2017   NA 140 06/09/2016   K 3.9 08/28/2018   K 4.5 09/13/2017   K 4.5 06/09/2016  CL 106 08/28/2018   CL 103 09/13/2017   CL 104 06/09/2016   CO2 20 (L) 08/28/2018   CO2 24 09/13/2017   CO2 22 06/09/2016   GLUCOSE 287 (H) 08/28/2018   GLUCOSE 175 (H) 09/13/2017   GLUCOSE 190 (H) 06/09/2016   BUN 12 08/28/2018   BUN 17 09/13/2017   BUN 13 06/09/2016   CREATININE 1.03 08/28/2018   CREATININE 0.99 09/13/2017   CREATININE 0.97 06/09/2016   CALCIUM 8.6 (L) 08/28/2018   CALCIUM 8.8 09/13/2017   CALCIUM 9.6 06/09/2016   LFT No results for input(s): PROT, ALBUMIN, AST, ALT, ALKPHOS, BILITOT, BILIDIR, IBILI in the last 72 hours. PT/INR Lab Results  Component Value Date   INR 1.0 RATIO 02/05/2009   INR 1.0 RATIO 01/01/2009   Hepatitis Panel No results for input(s): HEPBSAG, HCVAB, HEPAIGM, HEPBIGM in the last 72 hours. C-Diff No components found for: CDIFF Lipase  No results found for: LIPASE  Drugs of Abuse  No results found for: LABOPIA,  COCAINSCRNUR, LABBENZ, AMPHETMU, THCU, LABBARB   RADIOLOGY STUDIES: No results found.   IMPRESSION:   *   Normocytic anemia, symptomatic.  3 episodes of formed black stool intermittent in last 4 weeks.  Symptoms began about a month ago. 2U PRBCs ordered.   Rule out NSAID induced ulcer. Had hyperplastic polyps on colonoscopy 9 months ago.  *    Abnormal, ischemic changes on cardiac stress test.  Cardiac cath on hold due to anemia.   *   IDDM  *   OSA.      PLAN:     *   Agree with bid po Protonix, 2 U PRBCs.   Stool FOBT test ordered.  CBC in AM after transfusions.   Will need EGD, ? Timing in light of postponed cardiac catheterization.  Will allow full liquids, n.p.o. after midnight to allow for any necessary testing.   Azucena Freed  08/28/2018, 4:13 PM  Phone [phone]   Attending physician's note   I have taken a history, examined the patient and reviewed the chart. I agree with the Advanced Practitioner's note, impression and recommendations. 67 year old male with history of CAD, status post PCI 2010 noted ST depression during stress test today for evaluation of progressive dyspnea on exertion.  Hemoglobin 5.6.  History of chronic alcohol use and also takes NSAIDs regularly Possible chronic GI blood loss secondary to peptic ulcer disease, gastritis, esophagitis. Low platelet count 125.  Will need to exclude portal hypertension.  Check CMP, albumin, PT and INR Obtain abdominal ultrasound Monitor hemoglobin every 12 hours and transfuse to hemoglobin 8-10 given ST depression during stress test. No overt GI bleed.  Denies melena or blood per rectum Will plan endoscopic intervention only if needed for therapeutic purposes, will hold off diagnostic EGD given patient has angina symptoms and ST depression on stress test. Continue PPI twice daily    K. Denzil Magnuson , MD [phone]

## 2018-08-28 NOTE — Progress Notes (Signed)
Follow-up Outpatient Visit Date: 08/28/2018  Primary Care Provider: [name], MD [place] [address]   Primary Cardiologist: Sherren Mocha, MD  Chief Complaint: Chest pain, shortness of breath, and abnormal stress test  HPI:  David [name] is a 67 y.o. year-old adult with history of coronary artery disease with complex two-vessel PCI in 2010, hypertension, diabetes mellitus, and hyperlipidemia, who is seen as a urgent add-on due to chest pain and shortness of breath with abnormal stress test earlier today.  The patient was seen in our office by Robbie Lis, PA, a week ago.  The patient reports a 2 to 3-week history of worsening exertional shortness of breath and left-sided chest tightness with modest activity.  Pain typically resolves within a minute or two of resting.  He has not needed to use nitroglycerin.  Mr. Vasil also reported significant lightheadedness when bending over, though this improved after de-escalation of metoprolol and losartan at his visit last week.  He denies palpitations, orthopnea, and edema.  He has been compliant with his medications.  Currently, Mr. Waldman is asymptomatic.  During his exercise tolerance test today, he developed shortness of breath and chest tightness during stage I, which lasted up to 5 minutes in recovery.[name]  He exercised for a total of 4 minutes.  EKG showed 1 to 2 mm horizontal ST depressions in the inferolateral leads.  Preliminary review of the perfusion images shows no significant defect at rest or stress.  Interestingly, stress test leading up two-vessel PCI in 2010 also showed ischemic EKG changes with no significant perfusion defect.  Echocardiogram was also performed today.  Formal interpretation is pending, though on my review, LVEF appears normal.  Right ventricle is mildly dilated.  No severe valvular dysfunction is  identified.  --------------------------------------------------------------------------------------------------  Past Medical History:  Diagnosis Date  . Allergy   . Arthritis   . CAD (coronary artery disease)    s/p bifurcation stenting (DES x2) and stenting bifurcation lesion distal RCA (1 DES)  . Cataract   . Depression   . Diabetes mellitus   . GERD (gastroesophageal reflux disease)   . Hyperlipemia   . Hypertension   . Myocardial infarction ([place])   . Sleep apnea    CPAP   Past Surgical History:  Procedure Laterality Date  . CATARACT EXTRACTION Bilateral   . CORONARY ANGIOPLASTY WITH STENT PLACEMENT   March 2010   Dr. Olevia Perches  . TONSILLECTOMY  1959    Current Meds  Medication Sig  . aspirin 81 MG tablet Take 81 mg by mouth daily.    [name] glipiZIDE (GLUCOTROL) 5 MG tablet Take 1 tablet (5 mg total) by mouth 2 (two) times daily before a meal.  . glucose blood (ONE TOUCH ULTRA TEST) test strip Use to test blood sugar 2 times daily as instructed. Dx: E11.59, E11.65  . Insulin Glargine (LANTUS SOLOSTAR) 100 UNIT/ML Solostar Pen INJECT 30 UNITS SUBCUTANEOUSLY ONCE DAILY AT  10PM  . losartan (COZAAR) 50 MG tablet Take 1.5 tablets (75 mg total) by mouth daily.  . metFORMIN (GLUCOPHAGE) 1000 MG tablet Take 1 tablet (1,000 mg total) by mouth 2 (two) times daily with a meal.  . metoprolol succinate (TOPROL XL) 25 MG 24 hr tablet Take 0.5 tablets (12.5 mg total) by mouth daily.  . naproxen sodium (ALEVE) 220 MG tablet Take 220-440 mg by mouth 2 (two) times daily as needed (pain).   . nitroGLYCERIN (NITROSTAT) 0.4 MG SL tablet Place 1 tablet (0.4  mg total) under the tongue every 5 (five) minutes as needed.  Glory Rosebush DELICA LANCETS FINE MISC Use to test blood sugar 2 times daily as instructed. Dx: E11.59, E11.65  . PARoxetine (PAXIL) 20 MG tablet TAKE 2 TABLETS BY MOUTH ONCE DAILY FOR DEPRESSION  . polyethylene glycol powder (MIRALAX) powder Take 1 Container by mouth once. One time use  for colonoscopy  . rosuvastatin (CRESTOR) 10 MG tablet Take 1 tablet (10 mg total) by mouth daily.  . sildenafil (REVATIO) 20 MG tablet Take 60-100 mg by mouth daily as needed (for ED).   [name] triamcinolone cream (KENALOG) 0.1 % APPLY TO AFFECTED AREA TWICE A DAY FOR SCALP   Current Facility-Administered Medications for the 08/28/18 encounter (Office Visit) with Raheen Capili, Harrell Gave, MD  Medication  . 0.9 %  sodium chloride infusion  . 0.9 %  sodium chloride infusion    Allergies: Venlafaxine  Social History   Tobacco Use  . Smoking status: Former Smoker    Types: Cigarettes    Last attempt to quit: 11/30/1983    Years since quitting: 34.7  . Smokeless tobacco: Never Used  Substance Use Topics  . Alcohol use: Yes    Alcohol/week: 5.0 standard drinks    Types: 5 Standard drinks or equivalent per week    Comment: beer  . Drug use: No    Family History  Problem Relation Age of Onset  . Heart failure Father        Deceased 32 y/o  . Diabetes Father   . Cancer Father        Prostate  . Heart failure Mother        Deceased 22 y/o  . Alcohol abuse Mother   . Schizophrenia Son        Paranoid  . Uveitis Son   . Cancer Brother        prostate  . Colon cancer Neg Hx   . Esophageal cancer Neg Hx   . Stomach cancer Neg Hx   . Liver cancer Neg Hx   . Pancreatic cancer Neg Hx   . Rectal cancer Neg Hx     Review of Systems: A 12-system review of systems was performed and was negative except as noted in the HPI.  --------------------------------------------------------------------------------------------------  Physical Exam: BP (!) 156/70   Pulse (!) 109   Ht 5\' 9"  (1.753 m)   Wt 194 lb 12.8 oz (88.4 kg)   BMI 28.77 kg/m   General: NAD. HEENT: No conjunctival pallor or scleral icterus. Moist mucous membranes.  OP clear. Neck: Supple without lymphadenopathy, thyromegaly, JVD, or HJR. Lungs: Normal work of breathing. Clear to auscultation bilaterally without wheezes or  crackles. Heart: Regular rate and rhythm without murmurs, rubs, or gallops. Non-displaced PMI. Abd: Bowel sounds present. Soft, NT/ND without hepatosplenomegaly Ext: Trace pretibial edema bilaterally. Radial, PT, and DP pulses are 2+ bilaterally. Skin: Warm and dry without rash.  EKG: Baseline stress test EKG today demonstrates normal sinus rhythm with PAC.  Otherwise, no significant abnormalities.  Lab Results  Component Value Date   WBC 6.2 07/13/2016   HGB 12.6 (L) 07/13/2016   HCT 37.4 (L) 07/13/2016   MCV 100.3 (H) 07/13/2016   PLT 114.0 (L) 07/13/2016    Lab Results  Component Value Date   NA 136 09/13/2017   K 4.5 09/13/2017   CL 103 09/13/2017   CO2 24 09/13/2017   BUN 17 09/13/2017   CREATININE 0.99 09/13/2017   GLUCOSE 175 (H) 09/13/2017  ALT 36 09/13/2017    Lab Results  Component Value Date   CHOL 129 09/13/2017   HDL 40.70 09/13/2017   LDLCALC 66 09/13/2017   LDLDIRECT 103.9 11/05/2013   TRIG 112.0 09/13/2017   CHOLHDL 3 09/13/2017    --------------------------------------------------------------------------------------------------  ASSESSMENT AND PLAN: Coronary artery disease with unstable angina I am concerned that the patient's progressive shortness of breath and exertional chest pain reflect unstable angina.  Stress test today does not show any obvious perfusion defects.  However, his chest pain during stage I with dynamic EKG changes is concerning for attentional balanced ischemia.  I discussed further evaluation with Mr. Rolanda Jay and have recommended expedited cardiac catheterization.  We have made arrangements for him to undergo catheterization today with Dr. Burt Knack.  I have reviewed the risks, indications, and alternatives to cardiac catheterization, possible angioplasty, and stenting with the patient. Risks include but are not limited to bleeding, infection, vascular injury, stroke, myocardial infection, arrhythmia, kidney injury, radiation-related  injury in the case of prolonged fluoroscopy use, emergency cardiac surgery, and death. The patient understands the risks of serious complication is 1-2 in 5520 with diagnostic cardiac cath and 1-2% or less with angioplasty/stenting.  Hypertension Blood pressure suboptimally controlled today in the setting of recent de-escalation of metoprolol and losartan.  I will defer medication changes at this time.  Hyperlipidemia Continue rosuvastatin.  Further titration as needed if LDL above goal (less than 70).  Follow-up: To be determined based on results of catheterization.  Nelva Bush, MD 08/28/2018 12:20 PM

## 2018-08-29 ENCOUNTER — Inpatient Hospital Stay (HOSPITAL_COMMUNITY): Payer: Medicare Other

## 2018-08-29 DIAGNOSIS — D649 Anemia, unspecified: Secondary | ICD-10-CM

## 2018-08-29 DIAGNOSIS — I25110 Atherosclerotic heart disease of native coronary artery with unstable angina pectoris: Secondary | ICD-10-CM

## 2018-08-29 DIAGNOSIS — R945 Abnormal results of liver function studies: Secondary | ICD-10-CM

## 2018-08-29 LAB — CBC
HEMATOCRIT: 22.6 % — AB (ref 39.0–52.0)
HEMOGLOBIN: 7.1 g/dL — AB (ref 13.0–17.0)
MCH: 28.5 pg (ref 26.0–34.0)
MCHC: 31.4 g/dL (ref 30.0–36.0)
MCV: 90.8 fL (ref 78.0–100.0)
Platelets: 107 10*3/uL — ABNORMAL LOW (ref 150–400)
RBC: 2.49 MIL/uL — ABNORMAL LOW (ref 4.22–5.81)
RDW: 15.9 % — ABNORMAL HIGH (ref 11.5–15.5)
WBC: 5.3 10*3/uL (ref 4.0–10.5)

## 2018-08-29 LAB — HIV ANTIBODY (ROUTINE TESTING W REFLEX): HIV Screen 4th Generation wRfx: NONREACTIVE

## 2018-08-29 LAB — GLUCOSE, CAPILLARY
GLUCOSE-CAPILLARY: 191 mg/dL — AB (ref 70–99)
Glucose-Capillary: 216 mg/dL — ABNORMAL HIGH (ref 70–99)
Glucose-Capillary: 184 mg/dL — ABNORMAL HIGH (ref 70–99)
Glucose-Capillary: 129 mg/dL — ABNORMAL HIGH (ref 70–99)

## 2018-08-29 LAB — HEMOGLOBIN A1C
Hgb A1c MFr Bld: 7 % — ABNORMAL HIGH (ref 4.8–5.6)
MEAN PLASMA GLUCOSE: 154 mg/dL

## 2018-08-29 LAB — PREPARE RBC (CROSSMATCH)

## 2018-08-29 MED ORDER — SODIUM CHLORIDE 0.9% IV SOLUTION: Freq: Once | INTRAVENOUS | Status: DC

## 2018-08-29 NOTE — Progress Notes (Addendum)
Daily Rounding Note  08/29/2018, 10:40 AM  LOS: 1 day   SUBJECTIVE:   Chief complaint: SOB, dizziness.  No longer light headed when up walking to bathroom.  No dyspnea.   No BM's since 9/29.          Do not see cardiology note outlining plan of care.    Hgb 5.6 >> 7.1 after 2 U PRBCs.    OBJECTIVE:         Vital signs in last 24 hours:    Temp:  [98 F (36.7 C)-99 F (37.2 C)] 98.3 F (36.8 C) (10/01 0917) Pulse Rate:  [78-109] 78 (10/01 0917) Resp:  [15-20] 18 (10/01 0917) BP: (142-170)/(70-96) 168/83 (10/01 0917) SpO2:  [95 %-100 %] 97 % (10/01 0917) Weight:  [86.3 kg-88.5 kg] 86.3 kg (10/01 0409) Last BM Date: 08/28/18 Filed Weights   08/28/18 1301 08/29/18 0409  Weight: 88.5 kg 86.3 kg   General: pallor resolved.  Alert, comfortable, not ill looking   Heart: RRR Chest: clear bil.   Abdomen: soft, NT, ND.  No mass or HSM  Extremities: no CCE Neuro/Psych:  Oriented x 3, calm, no tremors.  No weakness.    Intake/Output from previous day: 09/30 0701 - 10/01 0700 In: 1811.8 [P.O.:150; I.V.:977.8; Blood:684] Out: 1900 [Urine:1900]  Intake/Output this shift: Total I/O In: -  Out: 700 [Urine:700]  Lab Results: Recent Labs    08/28/18 1320 08/29/18 0640  WBC 5.0 5.3  HGB 5.6* 7.1*  HCT 19.2* 22.6*  PLT 125* 107*   BMET Recent Labs    08/28/18 1320  NA 137  K 3.9  CL 106  CO2 20*  GLUCOSE 287*  BUN 12  CREATININE 1.03  CALCIUM 8.6*     ASSESMENT:   *   Anemia.  A few sporadic black, formed stools over previous 4 weeks.  Normal BUN/creatinine.    Appropriate response to 2 U PRBCs.  Lightheadedness, fatigue resolved  Normal 12/2017 colonoscopy (no recurrent polyps).   Using moderate daily doses Aleve and 81 ASA at home.    *   Thrombocytopenia.  Dates back to 2009.    Drinks >/= 6 pack/day.  No hx of LIver dz.  Mild elevation AST and hx mild transaminitis.  No imaging of GI tract or  liver found in Epic.  Hep A B C serologies negative in 2017.    *   IDDM   PLAN   *  Pt/inr in AM.  Ultrasound today, he is NPO.    *   Await cardiology notes and plan.  GI will arrange EGD around cardiology cath plans  *  Can have diabetic diet after ultrasound if no plans for cath today.      David [name]  08/29/2018, 10:40 AM Phone [phone]   Attending physician's note   I have taken an interval history, reviewed the chart and examined the patient. I agree with the Advanced Practitioner's note, impression and recommendations.   Patient is currently not having any overt GI bleed.  Anemia possibly multifactorial  Abdominal ultrasound suggestive of nodular liver/cirrhosis.  Thrombocytopenia likely secondary to portal hypertension versus bone marrow suppression secondary to chronic alcohol use He had ST depression during stress test yesterday.  He is at increased risk for MI intraprocedure or peri-procedure for diagnostic EGD with anesthesia.  Hemoglobin responded appropriately to 2 unit packed RBC transfusion We are available if he develops GI hemorrhage to intervene  if needed on anticoagulation or antiplatelet therapy. Check PT/INR , hepatitis panel, ferritin, iron panel, b12, folate and ANA Discussed plan with Dr. Verlon Au Cardiologist hasnt seen the patient yet, await final recommendations  K. Denzil Magnuson , MD [phone]

## 2018-08-29 NOTE — Progress Notes (Addendum)
Progress Note  Patient Name: David [name] Date of Encounter: 08/29/2018  Primary Cardiologist: Dr. Sherren Mocha, MD   Subjective   Pt chest pain free. Hb currently remains low at 7.1. Not a  Cath candidate at this time.   Inpatient Medications    Scheduled Meds: . insulin aspart  0-15 Units Subcutaneous TID WC  . insulin aspart  0-5 Units Subcutaneous QHS  . insulin glargine  10 Units Subcutaneous QHS  . metoprolol succinate  12.5 mg Oral Daily  . pantoprazole  40 mg Oral BID  . PARoxetine  20 mg Oral Daily  . rosuvastatin  10 mg Oral Daily   Continuous Infusions: . lactated ringers 75 mL/hr at 08/28/18 1908   PRN Meds: nitroGLYCERIN   Vital Signs    Vitals:   08/29/18 0128 08/29/18 0129 08/29/18 0409 08/29/18 0917  BP: (!) 142/85 (!) 142/85 (!) 157/81 (!) 168/83  Pulse: 79 79 80 78  Resp: 15   18  Temp: 98 F (36.7 C) 98 F (36.7 C) 98 F (36.7 C) 98.3 F (36.8 C)  TempSrc: Oral Oral Oral Oral  SpO2: 99% 99% 97% 97%  Weight:   86.3 kg   Height:        Intake/Output Summary (Last 24 hours) at 08/29/2018 1140 Last data filed at 08/29/2018 0917 Gross per 24 hour  Intake 1811.8 ml  Output 2600 ml  Net -788.2 ml   Filed Weights   08/28/18 1301 08/29/18 0409  Weight: 88.5 kg 86.3 kg   Physical Exam   General: Well developed, well nourished, NAD Skin: Warm, dry, intact  Head: Normocephalic, atraumatic, clear, moist mucus membranes. Neck: Negative for carotid bruits. No JVD Lungs:Clear to ausculation bilaterally. No wheezes, rales, or rhonchi. Breathing is unlabored. Cardiovascular: RRR with S1 S2. No murmurs, rubs, gallops, or LV heave appreciated. Abdomen: Soft, non-tender, non-distended with normoactive bowel sounds.  No obvious abdominal masses. MSK: Strength and tone appear normal for age. 5/5 in all extremities Extremities: No edema. No clubbing or cyanosis. DP/PT pulses 2+ bilaterally Neuro: Alert and oriented. No focal deficits. No facial  asymmetry. MAE spontaneously. Psych: Responds to questions appropriately with normal affect.    Labs    Chemistry Recent Labs  Lab 08/28/18 1320  NA 137  K 3.9  CL 106  CO2 20*  GLUCOSE 287*  BUN 12  CREATININE 1.03  CALCIUM 8.6*  GFRNONAA >60  GFRAA >60  ANIONGAP 11     Hematology Recent Labs  Lab 08/28/18 1320 08/29/18 0640  WBC 5.0 5.3  RBC 2.02* 2.49*  HGB 5.6* 7.1*  HCT 19.2* 22.6*  MCV 95.0 90.8  MCH 27.7 28.5  MCHC 29.2* 31.4  RDW 16.6* 15.9*  PLT 125* 107*    Cardiac EnzymesNo results for input(s): TROPONINI in the last 168 hours. No results for input(s): TROPIPOC in the last 168 hours.   BNPNo results for input(s): BNP, PROBNP in the last 168 hours.   DDimer No results for input(s): DDIMER in the last 168 hours.   Radiology    No results found.  Telemetry    08/29/18 NSR HR 70's - Personally Reviewed  ECG     No new tracings as of 08/29/18- Personally Reviewed  Cardiac Studies   Stress test 08/28/18:  Nuclear stress EF: 64%.  Blood pressure demonstrated a hypertensive response to exercise.  Downsloping ST segment depression ST segment depression of 3 mm was noted during stress in the II, III, aVF, V6, V5  and V4 leads.  T wave inversion was noted during stress in the II, III, aVF, V6, V5 and V4 leads, and returning to baseline after 1-5 mins of recovery.  This is an intermediate risk study.   Normal perfusion images. LVEF 64% with normal wall motion. However, poor exercise tolerance with chest pain, pallor and 3 mm lateral ST segment depression at exercise. Clinical findings suggestive of ischemia. Clinical correlation is strongly advised.  Patient Profile     67 y.o. adult with history of coronary artery disease with complex two-vessel PCI in 2010, hypertension, diabetes mellitus, and hyperlipidemia, who was seen as a urgent add-on in the office due to chest pain and shortness of breath with abnormal stress test on 08/28/18 with plans  to transfer to hospital for heart cath complicated by profound anemia. Pt was admitted per hospitalist service with Cardiology following   Assessment & Plan    1. CAD with unstable angina: -Pt presented as urgent add on in the office on 08/28/18 for chest pain and abnormal stress test. Plan was initally to admit to [facility] for heart cath however pt found to be profoundly anemic with Hb ob 5.6 upon presentation  -Deferring troponin as this would not change management per Dr. Burt Knack  -Cath cancelled for today secondary to above -Plan per GI note to plan EGD around heart cath>>will need to make sure anemia is stable prior to cath although anemia could have precipitated chest pain -Will discuss further ischemic evaluation with MD as this patient is not currently a cath candidate with a Hb of 7.1  -Currently asymptomatic  -Echocardiogram with NWMA and LVEF of 60-65% on 08/28/18 -Continue BB, statin  2. Acute blood loss anemia: -Pt with hx of black, tarry stools>>no recurrence since admission  -NSAID currently on hold  -Hb 5.6 on presentation, 7.1 today s/p PRBCx2 -GI consulted for further assistance>>plan for EGD based on cardiology plan  -Per GI/primary team   3. DM2: -Stable, HbA1c, 7.0 -SSI for glucose control while inpatient status   4. HTN: -Elevated, 168/83>157/81>142/85 -Continue metoprolol, statin   Signed, Kathyrn Drown NP-C [facility] Pager: [phone] 08/29/2018, 11:40 AM     For questions or updates, please contact   Please consult [email or web address] for contact info under Cardiology/STEMI. -----------------------------------------------------------------------------   History and all data above reviewed.  Patient examined.  I agree with the findings as above.  David [name] is a 67 year old gentleman admitted for unstable angina and abnormal stress test, subsequently found to have anemia with a hemoglobin of 5.6 g/dL with unclear etiology of anemia.  Mr. David [name] is a patient  of Dr. Sherren Mocha and was seen recently by Dr. Harrell Gave End.  He has a history of coronary artery disease with complex two-vessel PCI in 2010, hypertension, diabetes mellitus, and hyperlipidemia.  He was seen as an urgent add-on due to chest pain and shortness of breath as well as an abnormal stress test. Please see Dr. Darnelle Bos note from 08/28/18 for full details.  Briefly, the patient underwent an exercise tolerance test today where he developed shortness of breath and chest tightness during stage I which lasted 5 minutes into recovery.  He exercised for a total of 4 minutes.  EKG showed 1 to 2 mm horizontal ST depressions in the inferolateral leads.  There were no significant resting or stress perfusion defects on imaging.  Wall motion and ejection fraction were also felt to be normal.  On admission he was found to have significant anemia and GI evaluation was recommended.  The patient tells me that he takes 2 naproxen daily, uses quite a bit of Alka-Seltzer, and also takes a baby aspirin daily.  He has noticed at least 3 tarry black stools in the past 4 weeks.  He is not on iron supplementation.  He has not had frank bright red blood per rectum aside from hemorrhoidal bleeding which he is familiar with.  He has not had hematemesis.  Constitutional: No acute distress Eyes: pupils equally round and reactive to light, sclera non-icteric, normal conjunctiva and lids ENMT: normal dentition, moist mucous membranes Cardiovascular: regular rhythm, normal rate, no murmurs. S1 and S2 normal. Radial pulses normal bilaterally. No jugular venous distention.  Respiratory: clear to auscultation bilaterally GI : normal bowel sounds, soft and nontender. No distention.   MSK: extremities warm, well perfused. No edema.  NEURO: grossly nonfocal exam, moves all extremities. PSYCH: alert and oriented x 3, normal mood and affect.   All available labs, radiology testing, previous records reviewed. Agree with  documented assessment and plan of my colleague as stated above with the following additions or changes:  Active Problems:  Possible GI bleed Unstable angina, likely demand ischemia  Anemia, undefined origin  It is unclear why Mr. Gladstone Lighter has significant anemia, however given that he underwent stress testing with a hemoglobin of 5.6, it is likely that his abnormal stress results are secondary to demand ischemia. We will discuss with our colleagues in GI to determine appropriate timing of procedure.   ADDENDUM: After discussing with Medicine and GI, GI feels that scope would be too high risk before defining his coronary anatomy. I will discuss this with interventional cardiology. In the interim, he is receiving his 3rd unit of blood, and we will monitor for stability with morning labs.  Plan:  Elouise Munroe, MD [facility] 12:59 PM  08/29/2018

## 2018-08-29 NOTE — H&P (View-Only) (Signed)
Progress Note  Patient Name: David [name] Date of Encounter: 08/29/2018  Primary Cardiologist: Dr. Sherren Mocha, MD   Subjective   Pt chest pain free. Hb currently remains low at 7.1. Not a  Cath candidate at this time.   Inpatient Medications    Scheduled Meds: . insulin aspart  0-15 Units Subcutaneous TID WC  . insulin aspart  0-5 Units Subcutaneous QHS  . insulin glargine  10 Units Subcutaneous QHS  . metoprolol succinate  12.5 mg Oral Daily  . pantoprazole  40 mg Oral BID  . PARoxetine  20 mg Oral Daily  . rosuvastatin  10 mg Oral Daily   Continuous Infusions: . lactated ringers 75 mL/hr at 08/28/18 1908   PRN Meds: nitroGLYCERIN   Vital Signs    Vitals:   08/29/18 0128 08/29/18 0129 08/29/18 0409 08/29/18 0917  BP: (!) 142/85 (!) 142/85 (!) 157/81 (!) 168/83  Pulse: 79 79 80 78  Resp: 15   18  Temp: 98 F (36.7 C) 98 F (36.7 C) 98 F (36.7 C) 98.3 F (36.8 C)  TempSrc: Oral Oral Oral Oral  SpO2: 99% 99% 97% 97%  Weight:   86.3 kg   Height:        Intake/Output Summary (Last 24 hours) at 08/29/2018 1140 Last data filed at 08/29/2018 0917 Gross per 24 hour  Intake 1811.8 ml  Output 2600 ml  Net -788.2 ml   Filed Weights   08/28/18 1301 08/29/18 0409  Weight: 88.5 kg 86.3 kg   Physical Exam   General: Well developed, well nourished, NAD Skin: Warm, dry, intact  Head: Normocephalic, atraumatic, clear, moist mucus membranes. Neck: Negative for carotid bruits. No JVD Lungs:Clear to ausculation bilaterally. No wheezes, rales, or rhonchi. Breathing is unlabored. Cardiovascular: RRR with S1 S2. No murmurs, rubs, gallops, or LV heave appreciated. Abdomen: Soft, non-tender, non-distended with normoactive bowel sounds.  No obvious abdominal masses. MSK: Strength and tone appear normal for age. 5/5 in all extremities Extremities: No edema. No clubbing or cyanosis. DP/PT pulses 2+ bilaterally Neuro: Alert and oriented. No focal deficits. No facial  asymmetry. MAE spontaneously. Psych: Responds to questions appropriately with normal affect.    Labs    Chemistry Recent Labs  Lab 08/28/18 1320  NA 137  K 3.9  CL 106  CO2 20*  GLUCOSE 287*  BUN 12  CREATININE 1.03  CALCIUM 8.6*  GFRNONAA >60  GFRAA >60  ANIONGAP 11     Hematology Recent Labs  Lab 08/28/18 1320 08/29/18 0640  WBC 5.0 5.3  RBC 2.02* 2.49*  HGB 5.6* 7.1*  HCT 19.2* 22.6*  MCV 95.0 90.8  MCH 27.7 28.5  MCHC 29.2* 31.4  RDW 16.6* 15.9*  PLT 125* 107*    Cardiac EnzymesNo results for input(s): TROPONINI in the last 168 hours. No results for input(s): TROPIPOC in the last 168 hours.   BNPNo results for input(s): BNP, PROBNP in the last 168 hours.   DDimer No results for input(s): DDIMER in the last 168 hours.   Radiology    No results found.  Telemetry    08/29/18 NSR HR 70's - Personally Reviewed  ECG     No new tracings as of 08/29/18- Personally Reviewed  Cardiac Studies   Stress test 08/28/18:  Nuclear stress EF: 64%.  Blood pressure demonstrated a hypertensive response to exercise.  Downsloping ST segment depression ST segment depression of 3 mm was noted during stress in the II, III, aVF, V6, V5  and V4 leads.  T wave inversion was noted during stress in the II, III, aVF, V6, V5 and V4 leads, and returning to baseline after 1-5 mins of recovery.  This is an intermediate risk study.   Normal perfusion images. LVEF 64% with normal wall motion. However, poor exercise tolerance with chest pain, pallor and 3 mm lateral ST segment depression at exercise. Clinical findings suggestive of ischemia. Clinical correlation is strongly advised.  Patient Profile     67 y.o. adult with history of coronary artery disease with complex two-vessel PCI in 2010, hypertension, diabetes mellitus, and hyperlipidemia, who was seen as a urgent add-on in the office due to chest pain and shortness of breath with abnormal stress test on 08/28/18 with plans  to transfer to hospital for heart cath complicated by profound anemia. Pt was admitted per hospitalist service with Cardiology following   Assessment & Plan    1. CAD with unstable angina: -Pt presented as urgent add on in the office on 08/28/18 for chest pain and abnormal stress test. Plan was initally to admit to [facility] for heart cath however pt found to be profoundly anemic with Hb ob 5.6 upon presentation  -Deferring troponin as this would not change management per Dr. Burt Knack  -Cath cancelled for today secondary to above -Plan per GI note to plan EGD around heart cath>>will need to make sure anemia is stable prior to cath although anemia could have precipitated chest pain -Will discuss further ischemic evaluation with MD as this patient is not currently a cath candidate with a Hb of 7.1  -Currently asymptomatic  -Echocardiogram with NWMA and LVEF of 60-65% on 08/28/18 -Continue BB, statin  2. Acute blood loss anemia: -Pt with hx of black, tarry stools>>no recurrence since admission  -NSAID currently on hold  -Hb 5.6 on presentation, 7.1 today s/p PRBCx2 -GI consulted for further assistance>>plan for EGD based on cardiology plan  -Per GI/primary team   3. DM2: -Stable, HbA1c, 7.0 -SSI for glucose control while inpatient status   4. HTN: -Elevated, 168/83>157/81>142/85 -Continue metoprolol, statin   Signed, Kathyrn Drown NP-C [facility] Pager: [phone] 08/29/2018, 11:40 AM     For questions or updates, please contact   Please consult [email or web address] for contact info under Cardiology/STEMI. -----------------------------------------------------------------------------   History and all data above reviewed.  Patient examined.  I agree with the findings as above.  David [name] is a 67 year old gentleman admitted for unstable angina and abnormal stress test, subsequently found to have anemia with a hemoglobin of 5.6 g/dL with unclear etiology of anemia.  David [name] is a patient 67 of Dr. Sherren Mocha and was seen recently by Dr. Harrell Gave End.  He has a history of coronary artery disease with complex two-vessel PCI in 2010, hypertension, diabetes mellitus, and hyperlipidemia.  He was seen as an urgent add-on due to chest pain and shortness of breath as well as an abnormal stress test. Please see Dr. Darnelle Bos note from 08/28/18 for full details.  Briefly, the patient underwent an exercise tolerance test today where he developed shortness of breath and chest tightness during stage I which lasted 5 minutes into recovery.  He exercised for a total of 4 minutes.  EKG showed 1 to 2 mm horizontal ST depressions in the inferolateral leads.  There were no significant resting or stress perfusion defects on imaging.  Wall motion and ejection fraction were also felt to be normal.  On admission he was found to have significant anemia and GI evaluation was recommended.  The patient tells me that he takes 2 naproxen daily, uses quite a bit of Alka-Seltzer, and also takes a baby aspirin daily.  He has noticed at least 3 tarry black stools in the past 4 weeks.  He is not on iron supplementation.  He has not had frank bright red blood per rectum aside from hemorrhoidal bleeding which he is familiar with.  He has not had hematemesis.  Constitutional: No acute distress Eyes: pupils equally round and reactive to light, sclera non-icteric, normal conjunctiva and lids ENMT: normal dentition, moist mucous membranes Cardiovascular: regular rhythm, normal rate, no murmurs. S1 and S2 normal. Radial pulses normal bilaterally. No jugular venous distention.  Respiratory: clear to auscultation bilaterally GI : normal bowel sounds, soft and nontender. No distention.   MSK: extremities warm, well perfused. No edema.  NEURO: grossly nonfocal exam, moves all extremities. PSYCH: alert and oriented x 3, normal mood and affect.   All available labs, radiology testing, previous records reviewed. Agree with  documented assessment and plan of my colleague as stated above with the following additions or changes:  Active Problems:  Possible GI bleed Unstable angina, likely demand ischemia  Anemia, undefined origin  It is unclear why David [name] has significant anemia, however given that he underwent stress testing with a hemoglobin of 5.6, it is likely that his abnormal stress results are secondary to demand ischemia. We will discuss with our colleagues in GI to determine appropriate timing of procedure.   ADDENDUM: After discussing with Medicine and GI, GI feels that scope would be too high risk before defining his coronary anatomy. I will discuss this with interventional cardiology. In the interim, he is receiving his 3rd unit of blood, and we will monitor for stability with morning labs.  Plan:  David Munroe, MD [facility] 12:59 PM  08/29/2018

## 2018-08-29 NOTE — Progress Notes (Signed)
[facility] PROGRESS NOTE  David [name] [id number]:[id number] DOB: May 10, 1951 DOA: 08/28/2018 PCP: [name], MD   Narrative: 67 year old male PCI [place] 2.75X 18 mm DES 01/06/2009 Ostial arthritis-distant history Legg-calf -Perthes disease, DM TY 2 since 2008 followed by Dr. Sedalia Muta insulin since 1116, OSA, mild obesity, bipolar, rhinitis, hyperlipidemia Admitted from cardiology office 2/2 exertional dyspnea X 4 weeks and in process of doing stress test ST depressions inferolateral leads-cardiac catheter revealed hemoglobin 5.6-per report 4-5 episodes of black stools over the past couple of weeks-colonoscopy 12/2017 adenomatous polyps normal study to cecum-admitted to taking 2 Aleve per day, drinks 6 pack 5 days a week  GI consulted-abdominal ultrasound ordered-transfusion ordered-thrombocytopenia being worked up  [place]  Exertional chest pain-?  Demand ischemia secondary to low blood count---cardiologist to comment  Subacute probable upper GI blood loss-Per Gi--- no further dark stool for 48 hours argue against active bleed at this time-continue PPI and other management as per them-->? EGD  Anemia of blood loss probably secondary to #2-transfused 2 units PRBC-patient is also on saline which can cause hemodilution-suspect final blood count will be higher than this when checked again in a.m.  PCI 2010 DES-holding aspirin Plavix at this time  Prior history leg calf Perthes disease  OSA-continue CPAP at night  Mild obesity-outpatient management  DM TY 2, nephropathy, neuropathy on insulin-sugars 1 84-2 16 continue Lantus and sliding scale  Bipolar  Rhinitis  Hyperlipidemia     DVT prophylaxis: SCD at this time code Status: Inpatient family Communication: None disposition Plan: Home once stabilized in the next 2 to 3 days depending on planning for cath etc.   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-has been getting up and down over the past month to do his regular job and has felt dizzy but does not feel a recurrence of that dizziness today after transfusion  Objective:  Vitals:  Vitals:   08/29/18 0129 08/29/18 0409  BP: (!) 142/85 (!) 157/81  Pulse: 79 80  Resp:    Temp: 98 F (36.7 C) 98 F (36.7 C)  SpO2: 99% 97%    Exam:  Awake alert pleasant no distress looks about stated age no icterus no pallor No rash Chest is clear Abdomen is soft Rectal deferred (tells me he has had prior hemorrhoids) Neurologically intact   I have personally reviewed the following:   Labs:  Hemoglobin 7.1 platelet 107  A1c 7.0  Imaging studies:  None  Medical tests:  No  Test discussed with performing physician:  No  Decision to obtain old records:  No  Review and summation of old records:  No  Scheduled Meds: . Influenza vac split quadrivalent PF  0.5 mL Intramuscular Tomorrow-1000  . insulin aspart  0-15 Units Subcutaneous TID WC  . insulin aspart  0-5 Units Subcutaneous QHS  . insulin glargine  10 Units Subcutaneous QHS  . metoprolol succinate  12.5 mg Oral Daily  . pantoprazole  40 mg Oral BID  . PARoxetine  20 mg Oral Daily  . rosuvastatin  10 mg Oral Daily   Continuous Infusions: . lactated ringers  75 mL/hr at 08/28/18 1908    Active Problems:   GI bleed   LOS: 1 day

## 2018-08-30 DIAGNOSIS — I200 Unstable angina: Secondary | ICD-10-CM

## 2018-08-30 LAB — CBC WITH DIFFERENTIAL/PLATELET
Abs Immature Granulocytes: 0 10*3/uL (ref 0.0–0.1)
BASOS ABS: 0 10*3/uL (ref 0.0–0.1)
BASOS PCT: 0 %
EOS ABS: 0.3 10*3/uL (ref 0.0–0.7)
Eosinophils Relative: 5 %
HCT: 25.7 % — ABNORMAL LOW (ref 39.0–52.0)
Hemoglobin: 8.1 g/dL — ABNORMAL LOW (ref 13.0–17.0)
Immature Granulocytes: 1 %
Lymphocytes Relative: 19 %
Lymphs Abs: 1.1 10*3/uL (ref 0.7–4.0)
MCH: 28.9 pg (ref 26.0–34.0)
MCHC: 31.5 g/dL (ref 30.0–36.0)
MCV: 91.8 fL (ref 78.0–100.0)
MONO ABS: 0.8 10*3/uL (ref 0.1–1.0)
MONOS PCT: 15 %
NEUTROS PCT: 60 %
Neutro Abs: 3.4 10*3/uL (ref 1.7–7.7)
PLATELETS: 104 10*3/uL — AB (ref 150–400)
RBC: 2.8 MIL/uL — ABNORMAL LOW (ref 4.22–5.81)
RDW: 15.9 % — AB (ref 11.5–15.5)
WBC: 5.7 10*3/uL (ref 4.0–10.5)

## 2018-08-30 LAB — BASIC METABOLIC PANEL
Anion gap: 8 (ref 5–15)
BUN: 8 mg/dL (ref 8–23)
CALCIUM: 8.7 mg/dL — AB (ref 8.9–10.3)
CO2: 24 mmol/L (ref 22–32)
Chloride: 106 mmol/L (ref 98–111)
Creatinine, Ser: 0.92 mg/dL (ref 0.61–1.24)
GFR calc Af Amer: >60 mL/min (ref >60)
GLUCOSE: 191 mg/dL — AB (ref 70–99)
Potassium: 3.6 mmol/L (ref 3.5–5.1)
Sodium: 138 mmol/L (ref 135–145)

## 2018-08-30 LAB — GLUCOSE, CAPILLARY
GLUCOSE-CAPILLARY: 241 mg/dL — AB (ref 70–99)
GLUCOSE-CAPILLARY: 231 mg/dL — AB (ref 70–99)
Glucose-Capillary: 188 mg/dL — ABNORMAL HIGH (ref 70–99)
Glucose-Capillary: 185 mg/dL — ABNORMAL HIGH (ref 70–99)

## 2018-08-30 LAB — OCCULT BLOOD X 1 CARD TO LAB, STOOL: Fecal Occult Bld: NEGATIVE

## 2018-08-30 LAB — IRON AND TIBC
Iron: 99 ug/dL (ref 45–182)
Saturation Ratios: 20 % (ref 17.9–39.5)
TIBC: 487 ug/dL — AB (ref 250–450)
UIBC: 388 ug/dL

## 2018-08-30 LAB — PROTIME-INR
INR: 1.19
PROTHROMBIN TIME: 15.1 s (ref 11.4–15.2)

## 2018-08-30 LAB — FERRITIN: FERRITIN: 30 ng/mL (ref 24–336)

## 2018-08-30 MED ORDER — SODIUM CHLORIDE 0.9 % WEIGHT BASED INFUSION
3.0000 mL/kg/h | INTRAVENOUS | Status: AC
  Administered 2018-08-31: 3 mL/kg/h via INTRAVENOUS

## 2018-08-30 MED ORDER — SODIUM CHLORIDE 0.9 % IV SOLN: 250.0000 mL | INTRAVENOUS | Status: DC | PRN

## 2018-08-30 MED ORDER — INSULIN GLARGINE 100 UNIT/ML ~~LOC~~ SOLN
15.0000 [IU] | Freq: Every day | SUBCUTANEOUS | Status: DC
  Administered 2018-08-30 – 2018-08-31 (×2): 15 [IU] via SUBCUTANEOUS
  Filled 2018-08-30 (×3): qty 0.15

## 2018-08-30 MED ORDER — SODIUM CHLORIDE 0.9% FLUSH: 3.0000 mL | INTRAVENOUS | Status: DC | PRN

## 2018-08-30 MED ORDER — ACETAMINOPHEN 325 MG PO TABS
650.0000 mg | ORAL_TABLET | Freq: Three times a day (TID) | ORAL | Status: DC | PRN
  Administered 2018-08-30: 650 mg via ORAL
  Filled 2018-08-30: qty 2

## 2018-08-30 MED ORDER — SODIUM CHLORIDE 0.9 % WEIGHT BASED INFUSION
1.0000 mL/kg/h | INTRAVENOUS | Status: DC
  Administered 2018-08-31 (×2): 1 mL/kg/h via INTRAVENOUS

## 2018-08-30 MED ORDER — SODIUM CHLORIDE 0.9% FLUSH
3.0000 mL | Freq: Two times a day (BID) | INTRAVENOUS | Status: DC
  Administered 2018-08-30: 3 mL via INTRAVENOUS

## 2018-08-30 NOTE — Progress Notes (Signed)
PROGRESS NOTE   David [name]  WSF:[id number]    DOB: February 13, 1951    DOA: 08/28/2018  PCP: [name], MD   I have briefly reviewed patients previous medical records in [facility].  Brief Narrative:  67 year old independent male, PMH of CAD with complex two-vessel PCI in 2010, HTN, DM 2, HLD, arthritis for which he takes chronic NSAIDs/Aleve for years, GERD, OSA on CPAP, presented initially to cardiologist's office 08/21/2018 with couple of weeks history of progressive dizziness, lightheadedness, generalized weakness, dyspnea on exertion and chest discomfort.  At that time his antihypertensives were titrated down and he was scheduled for outpatient stress test.  On day of admission 10/1, during the stress test he experienced chest pain and noted to have ST depressions in the inferior lateral leads.  He denies syncope but reports episodes of near syncope.  Stress test was aborted and patient was sent to the hospital for urgent cardiac cath.  Hemoglobin 5.6 on admission.  He reports 3 episodes of black tarry stools over the last 4 weeks, last episode about a week PTA.  Colonoscopy 01/23/2018 with Dr.Dan Ardis Hughs was normal.  He was admitted for acute blood loss anemia due to subacute upper GI bleed and chest pain.  Cardiology and [facility] were consulted.   Assessment & Plan:   Active Problems:   GI bleed   Subacute Upper GI bleed: May be due to NSAID related ulcers or gastritis.  3 episodes of melena in the 4 weeks PTA and last one approximately a week prior to admission.  [facility] consulted and input appreciated.  Suspect chronic GI blood loss secondary to PUD, gastritis, esophagitis.  Abdominal ultrasound suggestive of cirrhosis and hence varices also on differentials.  INR normal/1.19.  No overt GI bleeding.  I discussed with Dr. Silverio Decamp who recommends endoscopic intervention only if needed for therapeutic purposes, hold off diagnostic EGD given angina, abnormal stress test.  Continue  twice daily PPI.  Acute blood loss anemia/iron deficiency anemia: Suspected due to GI bleed.  Presented with hemoglobin of 5.6.  Transfused 3 units of PRBCs and hemoglobin appropriately up to 8.1.  Ferritin 30.  Start iron supplements at discharge.  Asymptomatic of anemia now.  Thrombocytopenia: Appears to be chronic.?  Related to cirrhosis?  Alcohol.  Stable.  Chest pain/unstable angina/abnormal stress test/CAD: Cardiology was consulted.  Troponins not drawn per cardiology advice.  Suspect abnormal stress test and symptoms secondary to demand ischemia from severe anemia.  Cardiology plan to discuss with their interventional colleagues regarding further course of action. CP resolved.  Continue metoprolol.  Not on aspirin due to GI bleed.  Essential hypertension: Mildly uncontrolled at times.  Continue metoprolol.  Type II DM: Mildly uncontrolled.  Increase Lantus to 15 units nightly, continue SSI.  Continue to hold oral hypoglycemics.  A1c: 7.  Hyperlipidemia: Continue statins.  Arthritis: Advised patient that he should stop taking NSAIDs and use Tylenol.  If he needs something stronger than he will need to follow-up with his PCP.  He verbalized understanding.  OSA on CPAP  Hepatic cirrhosis:?  Related to alcohol.  Follow acute hepatitis panel.  Ferritin 30.  Follow ANA.  Outpatient follow-up with cardiology.  Alcohol cessation counseled.    DVT prophylaxis: SCDs Code Status: Full Family Communication: None at bedside Disposition: DC home pending clinical improvement and cardiac clearance.   Consultants:  [facility]  Procedures:  None  Antimicrobials:  None   Subjective: Patient feels much better compared to admission.  Last melanotic BM almost a week and a half ago.  No further chest pain, dyspnea, dizziness or lightheadedness or feeling like he is going to pass out.  ROS: As above, otherwise negative.  Objective:  Vitals:   08/29/18 1951 08/29/18 2144  08/30/18 0517 08/30/18 0801  BP: 135/83 138/67 (!) 146/73 (!) 160/86  Pulse: 79 77 73 76  Resp: 16 18 18    Temp: 98.1 F (36.7 C) 99.3 F (37.4 C) 98.3 F (36.8 C)   TempSrc: Oral Oral Oral   SpO2: 100% 97% 96%   Weight:   86.3 kg   Height:        Examination:  General exam: Pleasant middle-aged male, moderately built and nourished lying comfortably propped up in bed. Respiratory system: Clear to auscultation. Respiratory effort normal. Cardiovascular system: S1 & S2 heard, RRR. No JVD, murmurs, rubs, gallops or clicks. No pedal edema.  Telemetry personally reviewed: Sinus rhythm. Gastrointestinal system: Abdomen is nondistended, soft and nontender. No organomegaly or masses felt. Normal bowel sounds heard. Central nervous system: Alert and oriented. No focal neurological deficits. Extremities: Symmetric 5 x 5 power. Skin: No rashes, lesions or ulcers Psychiatry: Judgement and insight appear normal. Mood & affect appropriate.     Data Reviewed: I have personally reviewed following labs and imaging studies  CBC: Recent Labs  Lab 08/28/18 1320 08/29/18 0640 08/30/18 0417  WBC 5.0 5.3 5.7  NEUTROABS  --   --  3.4  HGB 5.6* 7.1* 8.1*  HCT 19.2* 22.6* 25.7*  MCV 95.0 90.8 91.8  PLT 125* 107* 628*   Basic Metabolic Panel: Recent Labs  Lab 08/28/18 1320 08/30/18 0417  NA 137 138  K 3.9 3.6  CL 106 106  CO2 20* 24  GLUCOSE 287* 191*  BUN 12 8  CREATININE 1.03 0.92  CALCIUM 8.6* 8.7*   Liver Function Tests: No results for input(s): AST, ALT, ALKPHOS, BILITOT, PROT, ALBUMIN in the last 168 hours. Coagulation Profile: Recent Labs  Lab 08/30/18 0417  INR 1.19   Cardiac Enzymes: No results for input(s): CKTOTAL, CKMB, CKMBINDEX, TROPONINI in the last 168 hours. HbA1C: Recent Labs    08/28/18 1845  HGBA1C 7.0*   CBG: Recent Labs  Lab 08/29/18 1127 08/29/18 1638 08/29/18 2127 08/30/18 0733 08/30/18 1248  GLUCAP 184* 129* 191* 188* 241*    No results  found for this or any previous visit (from the past 240 hour(s)).       Radiology Studies: US Abdomen Limited Ruq  Result Date: 08/29/2018 CLINICAL DATA:  Initial evaluation for abnormal of these. Evaluate for cirrhosis or fatty liver. EXAM: ULTRASOUND ABDOMEN LIMITED RIGHT UPPER QUADRANT COMPARISON:  None. FINDINGS: Gallbladder: No gallstones or wall thickening visualized. No sonographic Murphy sign noted by sonographer. Common bile duct: Diameter: 4.8 mm Liver: Liver demonstrates a coarse heterogeneous and echogenic echotexture with nodular contour, suggestive of cirrhosis. No focal intrahepatic lesions identified. Portal vein is patent on color Doppler imaging with normal direction of blood flow towards the liver. IMPRESSION: 1. Coarse echogenic echotexture of the liver with associated nodular contour, most suggestive of cirrhosis. No focal intrahepatic lesions identified. 2. Normal sonographic evaluation of the gallbladder. 3. No biliary dilatation. Electronically Signed   By: Jeannine Boga M.D.   On: 08/29/2018 15:40        Scheduled Meds: . sodium chloride   Intravenous Once  . insulin aspart  0-15 Units Subcutaneous TID WC  . insulin aspart  0-5 Units Subcutaneous QHS  . insulin glargine  10 Units Subcutaneous QHS  . metoprolol succinate  12.5 mg Oral Daily  . pantoprazole  40 mg Oral BID  . PARoxetine  20 mg Oral Daily  . rosuvastatin  10 mg Oral Daily   Continuous Infusions: . lactated ringers 75 mL/hr at 08/29/18 2249     LOS: 2 days     Vernell Leep, MD, FACP, [facility]. [facility] Pager [phone]  If 7PM-7AM, please contact night-coverage [email or web address] Password [facility] 08/30/2018, 3:27 PM

## 2018-08-30 NOTE — Progress Notes (Signed)
Pt states he has fallen twice in the last six month. Educated pt on high fall risk and how low hemoglobin can cause symptoms that lead to fall. Pt refuses bed alarm. Will continue to monitor

## 2018-08-30 NOTE — Progress Notes (Signed)
Interventional Note: Case discussed with Dr Margaretann Loveless. Pt known to me from outpatient setting. Stress test, lab data, and GI notes reviewed. I would favor diagnostic-only cath from a femoral approach to avoid heparin. At least we could define his anatomy and allow for endoscopic evaluation if he does not have high-risk coronary disease. I reviewed the plan with the patient and he agrees to proceed. Would avoid PCI if at all possible. Suspect his symptoms are primarily related to anemia but he also is at risk of obstructive CAD with prior complex intervention.  I have reviewed the risks, indications, and alternatives to cardiac catheterization, possible angioplasty, and stenting with the patient. Risks include but are not limited to bleeding, infection, vascular injury, stroke, myocardial infection, arrhythmia, kidney injury, radiation-related injury in the case of prolonged fluoroscopy use, emergency cardiac surgery, and death. The patient understands the risks of serious complication is 1-2 in 6808 with diagnostic cardiac cath and 1-2% or less with angioplasty/stenting.   Pt will be scheduled for Dr Ellyn Hack to do procedure tomorrow. Will write orders.  Sherren Mocha 08/30/2018 5:49 PM

## 2018-08-31 ENCOUNTER — Encounter (HOSPITAL_COMMUNITY)
Admission: AD | Disposition: A | Discharge status: Home / Self Care | Payer: Self-pay | Source: Ambulatory Visit | Attending: Internal Medicine

## 2018-08-31 ENCOUNTER — Ambulatory Visit (HOSPITAL_COMMUNITY): Admit: 2018-08-31 | Payer: Medicare Other | Admitting: Cardiology

## 2018-08-31 DIAGNOSIS — D649 Anemia, unspecified: Secondary | ICD-10-CM

## 2018-08-31 DIAGNOSIS — I2510 Atherosclerotic heart disease of native coronary artery without angina pectoris: Secondary | ICD-10-CM

## 2018-08-31 DIAGNOSIS — D539 Nutritional anemia, unspecified: Secondary | ICD-10-CM

## 2018-08-31 HISTORY — PX: LEFT HEART CATH AND CORONARY ANGIOGRAPHY: CATH118249

## 2018-08-31 LAB — COMPREHENSIVE METABOLIC PANEL
ALT: 37 U/L (ref 0–44)
AST: 51 U/L — AB (ref 15–41)
Albumin: 3.2 g/dL — ABNORMAL LOW (ref 3.5–5.0)
Alkaline Phosphatase: 64 U/L (ref 38–126)
Anion gap: 8 (ref 5–15)
BUN: 8 mg/dL (ref 8–23)
CO2: 24 mmol/L (ref 22–32)
Calcium: 8.6 mg/dL — ABNORMAL LOW (ref 8.9–10.3)
Chloride: 109 mmol/L (ref 98–111)
Creatinine, Ser: 0.94 mg/dL (ref 0.61–1.24)
GFR calc Af Amer: >60 mL/min (ref >60)
GFR calc non Af Amer: >60 mL/min (ref >60)
Glucose, Bld: 188 mg/dL — ABNORMAL HIGH (ref 70–99)
Potassium: 4 mmol/L (ref 3.5–5.1)
SODIUM: 141 mmol/L (ref 135–145)
Total Bilirubin: 0.8 mg/dL (ref 0.3–1.2)
Total Protein: 6.4 g/dL — ABNORMAL LOW (ref 6.5–8.1)

## 2018-08-31 LAB — CBC
HEMATOCRIT: 25.6 % — AB (ref 39.0–52.0)
HEMOGLOBIN: 8.1 g/dL — AB (ref 13.0–17.0)
MCH: 29.1 pg (ref 26.0–34.0)
MCHC: 31.6 g/dL (ref 30.0–36.0)
MCV: 92.1 fL (ref 78.0–100.0)
Platelets: 96 10*3/uL — ABNORMAL LOW (ref 150–400)
RBC: 2.78 MIL/uL — ABNORMAL LOW (ref 4.22–5.81)
RDW: 15.3 % (ref 11.5–15.5)
WBC: 4.9 10*3/uL (ref 4.0–10.5)

## 2018-08-31 LAB — HEPATITIS B SURFACE ANTIGEN: HEP B S AG: NEGATIVE

## 2018-08-31 LAB — HEPATITIS B SURFACE ANTIBODY,QUALITATIVE: HEP B S AB: NONREACTIVE

## 2018-08-31 LAB — GLUCOSE, CAPILLARY
GLUCOSE-CAPILLARY: 117 mg/dL — AB (ref 70–99)
GLUCOSE-CAPILLARY: 304 mg/dL — AB (ref 70–99)
Glucose-Capillary: 180 mg/dL — ABNORMAL HIGH (ref 70–99)
Glucose-Capillary: 189 mg/dL — ABNORMAL HIGH (ref 70–99)

## 2018-08-31 LAB — HCV COMMENT:

## 2018-08-31 LAB — HEPATITIS C ANTIBODY (REFLEX): HCV Ab: <0.1 s/co ratio (ref 0.0–0.9)

## 2018-08-31 LAB — ANA W/REFLEX IF POSITIVE: ANA: NEGATIVE

## 2018-08-31 LAB — HEPATITIS A ANTIBODY, TOTAL: HEP A TOTAL AB: NEGATIVE

## 2018-08-31 SURGERY — LEFT HEART CATH AND CORONARY ANGIOGRAPHY
Anesthesia: LOCAL

## 2018-08-31 MED ORDER — FENTANYL CITRATE (PF) 100 MCG/2ML IJ SOLN
INTRAMUSCULAR | Status: AC
  Filled 2018-08-31: qty 2

## 2018-08-31 MED ORDER — SODIUM CHLORIDE 0.9% FLUSH: 3.0000 mL | Freq: Two times a day (BID) | INTRAVENOUS | Status: DC

## 2018-08-31 MED ORDER — FOLIC ACID 1 MG PO TABS
1.0000 mg | ORAL_TABLET | Freq: Every day | ORAL | Status: DC
  Administered 2018-09-01: 1 mg via ORAL
  Filled 2018-08-31: qty 1

## 2018-08-31 MED ORDER — MIDAZOLAM HCL 2 MG/2ML IJ SOLN
INTRAMUSCULAR | Status: AC
  Filled 2018-08-31: qty 2

## 2018-08-31 MED ORDER — SODIUM CHLORIDE 0.9 % WEIGHT BASED INFUSION: 1.0000 mL/kg/h | INTRAVENOUS | Status: AC

## 2018-08-31 MED ORDER — SODIUM CHLORIDE 0.9 % IV SOLN: 250.0000 mL | INTRAVENOUS | Status: DC | PRN

## 2018-08-31 MED ORDER — LIDOCAINE HCL (PF) 1 % IJ SOLN
INTRAMUSCULAR | Status: AC
  Filled 2018-08-31: qty 30

## 2018-08-31 MED ORDER — SODIUM CHLORIDE 0.9% FLUSH: 3.0000 mL | INTRAVENOUS | Status: DC | PRN

## 2018-08-31 MED ORDER — LIDOCAINE HCL (PF) 1 % IJ SOLN
INTRAMUSCULAR | Status: DC | PRN
  Administered 2018-08-31: 20 mL via INTRADERMAL

## 2018-08-31 MED ORDER — HEPARIN (PORCINE) IN NACL 1000-0.9 UT/500ML-% IV SOLN
INTRAVENOUS | Status: DC | PRN
  Administered 2018-08-31 (×2): 500 mL

## 2018-08-31 MED ORDER — IOHEXOL 350 MG/ML SOLN
INTRAVENOUS | Status: DC | PRN
  Administered 2018-08-31: 45 mL via INTRA_ARTERIAL

## 2018-08-31 MED ORDER — PROSIGHT PO TABS
1.0000 | ORAL_TABLET | Freq: Every day | ORAL | Status: DC
  Administered 2018-09-01: 1 via ORAL
  Filled 2018-08-31: qty 1

## 2018-08-31 MED ORDER — MIDAZOLAM HCL 2 MG/2ML IJ SOLN
INTRAMUSCULAR | Status: DC | PRN
  Administered 2018-08-31: 2 mg via INTRAVENOUS

## 2018-08-31 MED ORDER — VITAMIN B-1 100 MG PO TABS
100.0000 mg | ORAL_TABLET | Freq: Every day | ORAL | Status: DC
  Administered 2018-09-01: 100 mg via ORAL
  Filled 2018-08-31: qty 1

## 2018-08-31 MED ORDER — FENTANYL CITRATE (PF) 100 MCG/2ML IJ SOLN
INTRAMUSCULAR | Status: DC | PRN
  Administered 2018-08-31: 25 ug via INTRAVENOUS

## 2018-08-31 MED ORDER — ONDANSETRON HCL 4 MG/2ML IJ SOLN: 4.0000 mg | Freq: Four times a day (QID) | INTRAMUSCULAR | Status: DC | PRN

## 2018-08-31 MED ORDER — HEPARIN (PORCINE) IN NACL 1000-0.9 UT/500ML-% IV SOLN
INTRAVENOUS | Status: AC
  Filled 2018-08-31: qty 1000

## 2018-08-31 SURGICAL SUPPLY — 7 items
CATH INFINITI 5FR MULTPACK ANG (CATHETERS) ×1 IMPLANT
KIT HEART LEFT (KITS) ×2 IMPLANT
PACK CARDIAC CATHETERIZATION (CUSTOM PROCEDURE TRAY) ×2 IMPLANT
SHEATH PINNACLE 5F 10CM (SHEATH) ×1 IMPLANT
SHEATH PROBE COVER 6X72 (BAG) ×1 IMPLANT
TRANSDUCER W/STOPCOCK (MISCELLANEOUS) ×2 IMPLANT
WIRE EMERALD 3MM-J .035X150CM (WIRE) ×1 IMPLANT

## 2018-08-31 NOTE — Progress Notes (Addendum)
Progress Note  Patient Name: David [name] Date of Encounter: 08/31/2018  Primary Cardiologist: Sherren Mocha, MD  Subjective   Patient was traveling to cath when I saw him. Appeared in good spirits.  Inpatient Medications    Scheduled Meds: . [MAR Hold] insulin aspart  0-15 Units Subcutaneous TID WC  . [MAR Hold] insulin aspart  0-5 Units Subcutaneous QHS  . [MAR Hold] insulin glargine  15 Units Subcutaneous QHS  . [MAR Hold] metoprolol succinate  12.5 mg Oral Daily  . [MAR Hold] pantoprazole  40 mg Oral BID  . [MAR Hold] PARoxetine  20 mg Oral Daily  . [MAR Hold] rosuvastatin  10 mg Oral Daily  . sodium chloride flush  3 mL Intravenous Q12H   Continuous Infusions: . sodium chloride    . sodium chloride 1 mL/kg/hr (08/31/18 1342)  . sodium chloride     PRN Meds: sodium chloride, [MAR Hold] acetaminophen, [MAR Hold] nitroGLYCERIN, ondansetron (ZOFRAN) IV, sodium chloride flush   Vital Signs    Vitals:   08/31/18 0100 08/31/18 0511 08/31/18 0849 08/31/18 1633  BP:  (!) 143/74 (!) 157/90 (!) 168/82  Pulse:  75 78   Resp:  16    Temp:  (!) 97.4 F (36.3 C)    TempSrc:  Oral    SpO2:  97%    Weight: 87 kg     Height:        Intake/Output Summary (Last 24 hours) at 08/31/2018 1645 Last data filed at 08/31/2018 1021 Gross per 24 hour  Intake 600 ml  Output 1250 ml  Net -650 ml   Filed Weights   08/29/18 0409 08/30/18 0517 08/31/18 0100  Weight: 86.3 kg 86.3 kg 87 kg    Telemetry    Sinus rhythm with infrequent PVCs - Personally Reviewed  ECG    NSR - Personally Reviewed  Physical Exam   GEN: No acute distress.   Neck: No JVD Cardiac: RRR, no murmurs, rubs, or gallops.  Respiratory: Clear to auscultation bilaterally. GI: Soft, nontender, non-distended  MS: No edema; No deformity. Neuro:  Nonfocal  Psych: Normal affect   Labs    Chemistry Recent Labs  Lab 08/28/18 1320 08/30/18 0417 08/31/18 0626  NA 137 138 141  K 3.9 3.6 4.0  CL 106  106 109  CO2 20* 24 24  GLUCOSE 287* 191* 188*  BUN 12 8 8   CREATININE 1.03 0.92 0.94  CALCIUM 8.6* 8.7* 8.6*  PROT  --   --  6.4*  ALBUMIN  --   --  3.2*  AST  --   --  51*  ALT  --   --  37  ALKPHOS  --   --  64  BILITOT  --   --  0.8  GFRNONAA >60 >60 >60  GFRAA >60 >60 >60  ANIONGAP 11 8 8      Hematology Recent Labs  Lab 08/29/18 0640 08/30/18 0417 08/31/18 0626  WBC 5.3 5.7 4.9  RBC 2.49* 2.80* 2.78*  HGB 7.1* 8.1* 8.1*  HCT 22.6* 25.7* 25.6*  MCV 90.8 91.8 92.1  MCH 28.5 28.9 29.1  MCHC 31.4 31.5 31.6  RDW 15.9* 15.9* 15.3  PLT 107* 104* 96*    Cardiac EnzymesNo results for input(s): TROPONINI in the last 168 hours. No results for input(s): TROPIPOC in the last 168 hours.   BNPNo results for input(s): BNP, PROBNP in the last 168 hours.   DDimer No results for input(s): DDIMER in the last 168 hours.  Radiology    No results found.  Cardiac Studies   Awaiting diagnostic angiogram  Patient Profile     67 y.o. male with history of coronary artery diseasewith complex two-vessel PCI in 2010, hypertension, diabetes mellitus, and hyperlipidemia, whowas seen as a urgent add-on in the office due to chest pain and shortness of breath with abnormal stress test on 08/28/18 with plans to transfer to hospital for heart cath complicated by profound anemia. Pt was admitted per hospitalist service with Cardiology following.   Assessment & Plan  Active Problems:   Coronary artery disease involving native coronary artery of native heart with unstable angina pectoris (HCC)   GI bleed   Anemia   PLAN: We will follow-up the results of coronary angiography, with the intention of defining his coronary anatomy and detecting any critical stenoses which would prove high risk for GI endoscopy.  Hemoglobin is stable today at 8.1 g/dL.  ADDENDUM: Coronary angiogram  Prox RCA to Mid RCA lesion is 40% stenosed.  Ost RPDA to RPDA lesion is 40% stenosed.  Post Atrio lesion  is 50% stenosed.  Ost 2nd Mrg to 2nd Mrg lesion is 25% stenosed.  Mid Cx to Dist Cx lesion is 10% stenosed.  Previously placed Prox Cx to Mid Cx stent (unknown type) is widely patent.   1. Patent stents in the left circumflex with mild restenosis 2. Patent stents in the RCA with mild in stent restenosis 3. Widely patent left main and LAD 4. Mildly elevated LVEDP  Stable coronary anatomy without high-grade coronary stenosis. Medical therapy for CAD. OK to proceed with GI evaluation as needed.   [facility] will sign off.   Medication Recommendations:  No changes to CV meds Other recommendations (labs, testing, etc):  Continue GI workup for anemia Follow up as an outpatient: with cardiology in 3 months.     Signed, Elouise Munroe, MD  08/31/2018, 4:45 PM

## 2018-08-31 NOTE — Progress Notes (Addendum)
Daily Rounding Note  08/31/2018, 12:56 PM  LOS: 3 days   SUBJECTIVE:   Chief complaint: Weakness, dizziness all improved.   Brown stool.  No nausea vomiting.  Awaiting cardiac cath.      OBJECTIVE:         Vital signs in last 24 hours:    Temp:  [97.4 F (36.3 C)-98.8 F (37.1 C)] 97.4 F (36.3 C) (10/03 0511) Pulse Rate:  [72-78] 78 (10/03 0849) Resp:  [16-18] 16 (10/03 0511) BP: (142-157)/(74-90) 157/90 (10/03 0849) SpO2:  [97 %-100 %] 97 % (10/03 0511) Weight:  [87 kg] 87 kg (10/03 0100) Last BM Date: 08/30/18 Filed Weights   08/29/18 0409 08/30/18 0517 08/31/18 0100  Weight: 86.3 kg 86.3 kg 87 kg   General: Comfortable sitting up in chair.  Does not look ill. Heart: RRR. Chest: Clear bilaterally.  No labored breathing. Abdomen: Active bowel sounds, soft.  Not tender or distended. Extremities: No CCE Neuro/Psych: Calm.  Fluid speech.  No gross deficits.  Intake/Output from previous day: 10/02 0701 - 10/03 0700 In: 1490 [P.O.:840; I.V.:650] Out: 1875 [Urine:1875]  Intake/Output this shift: Total I/O In: -  Out: 350 [Urine:350]  Lab Results: Recent Labs    08/29/18 0640 08/30/18 0417 08/31/18 0626  WBC 5.3 5.7 4.9  HGB 7.1* 8.1* 8.1*  HCT 22.6* 25.7* 25.6*  PLT 107* 104* 96*   BMET Recent Labs    08/28/18 1320 08/30/18 0417 08/31/18 0626  NA 137 138 141  K 3.9 3.6 4.0  CL 106 106 109  CO2 20* 24 24  GLUCOSE 287* 191* 188*  BUN 12 8 8   CREATININE 1.03 0.92 0.94  CALCIUM 8.6* 8.7* 8.6*   LFT Recent Labs    08/31/18 0626  PROT 6.4*  ALBUMIN 3.2*  AST 51*  ALT 37  ALKPHOS 64  BILITOT 0.8   PT/INR Recent Labs    08/30/18 0417  LABPROT 15.1  INR 1.19   Hepatitis Panel Recent Labs    08/30/18 0705  HEPBSAG Negative    Studies/Results: US Abdomen Limited Ruq  Result Date: 08/29/2018 CLINICAL DATA:  Initial evaluation for abnormal of these. Evaluate for cirrhosis or  fatty liver. EXAM: ULTRASOUND ABDOMEN LIMITED RIGHT UPPER QUADRANT COMPARISON:  None. FINDINGS: Gallbladder: No gallstones or wall thickening visualized. No sonographic Murphy sign noted by sonographer. Common bile duct: Diameter: 4.8 mm Liver: Liver demonstrates a coarse heterogeneous and echogenic echotexture with nodular contour, suggestive of cirrhosis. No focal intrahepatic lesions identified. Portal vein is patent on color Doppler imaging with normal direction of blood flow towards the liver. IMPRESSION: 1. Coarse echogenic echotexture of the liver with associated nodular contour, most suggestive of cirrhosis. No focal intrahepatic lesions identified. 2. Normal sonographic evaluation of the gallbladder. 3. No biliary dilatation. Electronically Signed   By: Jeannine Boga M.D.   On: 08/29/2018 15:40    ASSESMENT:   *   Anemia.  Suspect multifactorial.  No evidence of iron deficiency. S/P 2 units PRBC.  *   Sporadic dark but not melenic stools in the last several weeks.  Daily Aleve  *    Ultrasound liver showing nodular, cirrhotic appearance to the liver. Chronic alcohol abuse is probably the source.  Appetite is a, B, C serologies negative.  ANA negative  *    Ischemia on nuclear medicine study.  Cardiac cath canceled due to anemia.  Previous cardiac stenting. Cardiology, Dr. Antionette Char note yesterday states he favors  diagnostic only cath in order to define his anatomy.  If the anatomy is low risk, then cardiology feels EGD would be helpful.  Cardiology wants to avoid PCI if at all possible.  Given the finding of anemia his pre-cardiac eval symptoms of fatigue and shortness of breath are more attributable to anemia than they are to, as yet unknown CAD. Cardiac cath is planned for today   PLAN   *   Await cath findings.     Azucena Freed  08/31/2018, 12:56 PM Phone [phone]   Attending physician's note   I have reviewed the chart. I agree with the Advanced Practitioner's  note, impression and recommendations.     Patient was in cath lab yesterday afternoon, didn't see him.   Damaris Hippo , MD [phone]

## 2018-08-31 NOTE — Plan of Care (Signed)
  Problem: Clinical Measurements: Goal: Cardiovascular complication will be avoided Outcome: Progressing   Problem: Pain Managment: Goal: General experience of comfort will improve Outcome: Progressing   

## 2018-08-31 NOTE — Progress Notes (Signed)
Assumed care from previous RN. Patient alert and oriented, eating sandwich. Right groin assessed level 0. Will monitor.

## 2018-08-31 NOTE — Interval H&P Note (Signed)
History and Physical Interval Note:  08/31/2018 3:47 PM  David [name]  has presented today for surgery, with the diagnosis of diagnostic only  The various methods of treatment have been discussed with the patient and family. After consideration of risks, benefits and other options for treatment, the patient has consented to  Procedure(s): LEFT HEART CATH AND CORONARY ANGIOGRAPHY (N/A) as a surgical intervention .  The patient's history has been reviewed, patient examined, no change in status, stable for surgery.  I have reviewed the patient's chart and labs.  Questions were answered to the patient's satisfaction.     Sherren Mocha

## 2018-08-31 NOTE — Progress Notes (Signed)
PROGRESS NOTE   David [name]  TKW:[id number]    DOB: May 16, 1951    DOA: 08/28/2018  PCP: [name], MD   I have briefly reviewed patients previous medical records in [facility]  Brief Narrative:  67 year old independent male, PMH of CAD with complex two-vessel PCI in 2010, HTN, DM 2, HLD, arthritis for which he takes chronic NSAIDs/Aleve for years, GERD, OSA on CPAP, presented initially to cardiologist's office 08/21/2018 with couple of weeks history of progressive dizziness, lightheadedness, generalized weakness, dyspnea on exertion and chest discomfort.  At that time his antihypertensives were titrated down and he was scheduled for outpatient stress test.  On day of admission 10/1, during the stress test he experienced chest pain and noted to have ST depressions in the inferior lateral leads.  He denies syncope but reports episodes of near syncope.  Stress test was aborted and patient was sent to the hospital for urgent cardiac cath.  Hemoglobin 5.6 on admission.  He reports 3 episodes of black tarry stools over the last 4 weeks, last episode about a week PTA.  Colonoscopy 01/23/2018 with Dr.Dan Ardis Hughs was normal.  He was admitted for acute blood loss anemia due to subacute upper GI bleed and chest pain.  Cardiology and [facility] were consulted.   Assessment & Plan:   Active Problems:   Coronary artery disease involving native coronary artery of native heart with unstable angina pectoris (HCC)   GI bleed   Anemia   Subacute Upper GI bleed: May be due to NSAID related ulcers or gastritis.  3 episodes of melena in the 4 weeks PTA and last one approximately a week prior to admission.  [facility] consulted and input appreciated.  Suspect chronic GI blood loss secondary to PUD, gastritis, esophagitis.  Abdominal ultrasound suggestive of cirrhosis and hence varices also on differentials.  INR normal/1.19.  No overt GI bleeding.  I discussed with Dr. Silverio Decamp who recommends endoscopic  intervention only if needed for therapeutic purposes, hold off diagnostic EGD given angina, abnormal stress test.  Continue twice daily PPI.  As per cardiology and GI input, diagnostic only cath in order to define anatomy and if low risk then EGD would be helpful.  No clinically active GI bleed at this time.  Acute blood loss anemia/iron deficiency anemia: Suspected due to GI bleed.  Presented with hemoglobin of 5.6.  Transfused 3 units of PRBCs and hemoglobin appropriately up to 8.1.  Ferritin 30.  Start iron supplements at discharge.  Asymptomatic of anemia now.  Stable.  Thrombocytopenia: Appears to be chronic.?  Related to cirrhosis?  Alcohol.  Stable.  Chest pain/unstable angina/abnormal stress test/CAD: Cardiology was consulted.  Troponins not drawn per cardiology advice.  Suspect abnormal stress test and symptoms secondary to demand ischemia from severe anemia.  Cardiology plan to discuss with their interventional colleagues regarding further course of action. CP resolved.  Continue metoprolol.  Not on aspirin due to GI bleed.  Cardiology follow-up appreciated and plan for cardiac cath 10/3 to define his anatomy.  Even though his symptoms are likely due to demand ischemia in the context of severe anemia, CAD cannot be definitely ruled out as cause due to his prior history.  Cardiology would like to avoid PCI if at all possible.  Essential hypertension: Uncontrolled, mildly.  Consider increasing Toprol-XL to 25 mg daily.  Type II DM: Mildly uncontrolled.  Increase Lantus to 15 units nightly, continue SSI.  Continue to hold oral hypoglycemics.  A1c: 7.  Better  controlled.  Hyperlipidemia: Continue statins.  Arthritis: Advised patient that he should stop taking NSAIDs and use Tylenol.  If he needs something stronger than he will need to follow-up with his PCP.  He verbalized understanding.  OSA on CPAP  Hepatic cirrhosis:?  Related to alcohol.  Follow acute hepatitis panel: Negative.  Ferritin  30.  Follow ANA: Negative.  Outpatient follow-up with cardiology.  Alcohol cessation counseled.  Alcohol abuse: Patient reports drinking 5-6 beers every other day, denies liquor.  Has been drinking for several years.  Reports that he last drank on 9/30.  Thiamine, folate and multivitamins.  No withdrawal features.  DVT prophylaxis: SCDs Code Status: Full Family Communication: None at bedside Disposition: DC home pending clinical improvement and cardiac clearance.   Consultants:  [facility]  Procedures:  None  Antimicrobials:  None   Subjective: Seen this morning.  Was waiting for cardiac cath.  Normal brown BM.  No dizziness, lightheadedness, chest pain or dyspnea.  No vomiting.  ROS: As above, otherwise negative.  Objective:  Vitals:   08/31/18 1715 08/31/18 1720 08/31/18 1725 08/31/18 1751  BP: (!) 165/87 (!) 179/77 (!) 173/86 (!) 167/84  Pulse: 69 68 69 71  Resp: 20 11 17 18   Temp:    98.3 F (36.8 C)  TempSrc:    Oral  SpO2: 100% 97% 100% 100%  Weight:      Height:        Examination: No significant change in clinical exam compared to yesterday.  General exam: Pleasant middle-aged male, moderately built and nourished lying comfortably propped up in bed. Respiratory system: Clear to auscultation. Respiratory effort normal. Cardiovascular system: S1 & S2 heard, RRR. No JVD, murmurs, rubs, gallops or clicks. No pedal edema.  Telemetry personally reviewed: Sinus rhythm. Gastrointestinal system: Abdomen is nondistended, soft and nontender. No organomegaly or masses felt. Normal bowel sounds heard. Central nervous system: Alert and oriented. No focal neurological deficits. Extremities: Symmetric 5 x 5 power. Skin: No rashes, lesions or ulcers Psychiatry: Judgement and insight appear normal. Mood & affect appropriate.     Data Reviewed: I have personally reviewed following labs and imaging studies  CBC: Recent Labs  Lab 08/28/18 1320 08/29/18 0640  08/30/18 0417 08/31/18 0626  WBC 5.0 5.3 5.7 4.9  NEUTROABS  --   --  3.4  --   HGB 5.6* 7.1* 8.1* 8.1*  HCT 19.2* 22.6* 25.7* 25.6*  MCV 95.0 90.8 91.8 92.1  PLT 125* 107* 104* 96*   Basic Metabolic Panel: Recent Labs  Lab 08/28/18 1320 08/30/18 0417 08/31/18 0626  NA 137 138 141  K 3.9 3.6 4.0  CL 106 106 109  CO2 20* 24 24  GLUCOSE 287* 191* 188*  BUN 12 8 8   CREATININE 1.03 0.92 0.94  CALCIUM 8.6* 8.7* 8.6*   Liver Function Tests: Recent Labs  Lab 08/31/18 0626  AST 51*  ALT 37  ALKPHOS 64  BILITOT 0.8  PROT 6.4*  ALBUMIN 3.2*   Coagulation Profile: Recent Labs  Lab 08/30/18 0417  INR 1.19   Cardiac Enzymes: No results for input(s): CKTOTAL, CKMB, CKMBINDEX, TROPONINI in the last 168 hours. HbA1C: Recent Labs    08/28/18 1845  HGBA1C 7.0*   CBG: Recent Labs  Lab 08/30/18 1802 08/30/18 2114 08/31/18 0751 08/31/18 1122 08/31/18 1646  GLUCAP 185* 231* 180* 189* 117*    No results found for this or any previous visit (from the past 240 hour(s)).       Radiology  Studies: No results found.      Scheduled Meds: . insulin aspart  0-15 Units Subcutaneous TID WC  . insulin aspart  0-5 Units Subcutaneous QHS  . insulin glargine  15 Units Subcutaneous QHS  . metoprolol succinate  12.5 mg Oral Daily  . pantoprazole  40 mg Oral BID  . PARoxetine  20 mg Oral Daily  . rosuvastatin  10 mg Oral Daily  . [START ON 09/01/2018] sodium chloride flush  3 mL Intravenous Q12H   Continuous Infusions: . [START ON 09/01/2018] sodium chloride    . sodium chloride 1 mL/kg/hr (08/31/18 1652)     LOS: 3 days     Vernell Leep, MD, FACP, [facility]. [facility] Pager [phone]  If 7PM-7AM, please contact night-coverage [email or web address] Password [email or web address] 08/31/2018, 5:58 PM

## 2018-08-31 NOTE — Progress Notes (Signed)
Addendum  Cardiac cath report appreciated as below.  Stable coronary anatomy without high-grade coronary stenosis. Medical therapy for CAD. OK to proceed with GI evaluation as needed.   Vernell Leep, MD, FACP, [facility]. [facility] Pager [phone]  If 7PM-7AM, please contact night-coverage [email or web address] Password [email or web address] 08/31/2018, 6:06 PM

## 2018-08-31 NOTE — Progress Notes (Addendum)
Site area: Right  Groin a 5 french arterial sheath was removed by Dario Guardian RN  Site Prior to Removal:  Level 0  Pressure Applied For 40 MINUTES    Bedrest Beginning at  1725p  Manual:   Yes.    Patient Status During Pull:  stable  Post Pull Groin Site:  Level 0  Post Pull Instructions Given:  Yes.    Post Pull Pulses Present:  Yes.    Dressing Applied:  Yes.    Comments:  VS remain stable.

## 2018-09-01 ENCOUNTER — Inpatient Hospital Stay (HOSPITAL_COMMUNITY): Payer: Medicare Other | Admitting: Anesthesiology

## 2018-09-01 ENCOUNTER — Encounter (HOSPITAL_COMMUNITY)
Admission: AD | Disposition: A | Discharge status: Home / Self Care | Payer: Self-pay | Source: Ambulatory Visit | Attending: Internal Medicine

## 2018-09-01 ENCOUNTER — Encounter (HOSPITAL_COMMUNITY): Payer: Self-pay | Admitting: Cardiovascular Disease

## 2018-09-01 DIAGNOSIS — K7031 Alcoholic cirrhosis of liver with ascites: Secondary | ICD-10-CM

## 2018-09-01 DIAGNOSIS — K3189 Other diseases of stomach and duodenum: Secondary | ICD-10-CM

## 2018-09-01 DIAGNOSIS — K259 Gastric ulcer, unspecified as acute or chronic, without hemorrhage or perforation: Secondary | ICD-10-CM

## 2018-09-01 DIAGNOSIS — K7030 Alcoholic cirrhosis of liver without ascites: Principal | ICD-10-CM

## 2018-09-01 DIAGNOSIS — I8510 Secondary esophageal varices without bleeding: Secondary | ICD-10-CM

## 2018-09-01 DIAGNOSIS — T39395A Adverse effect of other nonsteroidal anti-inflammatory drugs [NSAID], initial encounter: Secondary | ICD-10-CM

## 2018-09-01 HISTORY — PX: BIOPSY: SHX5522

## 2018-09-01 HISTORY — PX: ESOPHAGOGASTRODUODENOSCOPY (EGD) WITH PROPOFOL: SHX5813

## 2018-09-01 LAB — BPAM RBC
Blood Product Expiration Date: 201910052359
Blood Product Expiration Date: 201910062359
Blood Product Expiration Date: 201910202359
Blood Product Expiration Date: 201910212359
ISSUE DATE / TIME: 201909302222
ISSUE DATE / TIME: 201910010105
ISSUE DATE / TIME: 201910011826
UNIT TYPE AND RH: 9500
UNIT TYPE AND RH: 9500
Unit Type and Rh: 9500
Unit Type and Rh: 9500

## 2018-09-01 LAB — TYPE AND SCREEN
ABO/RH(D): O NEG
ANTIBODY SCREEN: NEGATIVE
UNIT DIVISION: 0
UNIT DIVISION: 0
UNIT DIVISION: 0
Unit division: 0

## 2018-09-01 LAB — CBC
HEMATOCRIT: 26 % — AB (ref 39.0–52.0)
HEMOGLOBIN: 8.1 g/dL — AB (ref 13.0–17.0)
MCH: 28.8 pg (ref 26.0–34.0)
MCHC: 31.2 g/dL (ref 30.0–36.0)
MCV: 92.5 fL (ref 78.0–100.0)
Platelets: 83 10*3/uL — ABNORMAL LOW (ref 150–400)
RBC: 2.81 MIL/uL — ABNORMAL LOW (ref 4.22–5.81)
RDW: 15.4 % (ref 11.5–15.5)
WBC: 5.5 10*3/uL (ref 4.0–10.5)

## 2018-09-01 LAB — GLUCOSE, CAPILLARY
GLUCOSE-CAPILLARY: 135 mg/dL — AB (ref 70–99)
Glucose-Capillary: 189 mg/dL — ABNORMAL HIGH (ref 70–99)

## 2018-09-01 SURGERY — ESOPHAGOGASTRODUODENOSCOPY (EGD) WITH PROPOFOL
Anesthesia: Monitor Anesthesia Care

## 2018-09-01 MED ORDER — THIAMINE HCL 100 MG PO TABS: 100.0000 mg | ORAL_TABLET | Freq: Every day | ORAL | 0 refills | Status: DC

## 2018-09-01 MED ORDER — SODIUM CHLORIDE 0.9 % IV SOLN
INTRAVENOUS | Status: DC
  Administered 2018-09-01: 09:00:00 via INTRAVENOUS

## 2018-09-01 MED ORDER — PANTOPRAZOLE SODIUM 40 MG PO TBEC: 40.0000 mg | DELAYED_RELEASE_TABLET | Freq: Two times a day (BID) | ORAL | 1 refills | Status: DC

## 2018-09-01 MED ORDER — PROPOFOL 500 MG/50ML IV EMUL
INTRAVENOUS | Status: DC | PRN
  Administered 2018-09-01: 100 ug/kg/min via INTRAVENOUS

## 2018-09-01 MED ORDER — FOLIC ACID 1 MG PO TABS: 1.0000 mg | ORAL_TABLET | Freq: Every day | ORAL | 0 refills | Status: DC

## 2018-09-01 MED ORDER — NADOLOL 20 MG PO TABS
20.0000 mg | ORAL_TABLET | Freq: Every day | ORAL | Status: DC
  Administered 2018-09-01: 20 mg via ORAL
  Filled 2018-09-01 (×2): qty 1

## 2018-09-01 MED ORDER — NADOLOL 20 MG PO TABS: 20.0000 mg | ORAL_TABLET | Freq: Every day | ORAL | 0 refills | Status: DC

## 2018-09-01 MED ORDER — PROPOFOL 10 MG/ML IV BOLUS
INTRAVENOUS | Status: DC | PRN
  Administered 2018-09-01: 50 mg via INTRAVENOUS

## 2018-09-01 MED ORDER — LIDOCAINE 2% (20 MG/ML) 5 ML SYRINGE
INTRAMUSCULAR | Status: DC | PRN
  Administered 2018-09-01: 60 mg via INTRAVENOUS

## 2018-09-01 MED ORDER — TRIAMCINOLONE ACETONIDE 0.1 % EX CREA: 1.0000 "application " | TOPICAL_CREAM | Freq: Two times a day (BID) | CUTANEOUS | Status: DC | PRN

## 2018-09-01 MED ORDER — NITROGLYCERIN 0.4 MG SL SUBL: 0.4000 mg | SUBLINGUAL_TABLET | SUBLINGUAL | Status: DC | PRN

## 2018-09-01 SURGICAL SUPPLY — 15 items

## 2018-09-01 NOTE — Discharge Instructions (Signed)

## 2018-09-01 NOTE — Progress Notes (Addendum)
[facility] ROUNDING NOTE   Subjective: No complaints.  Denies any chest pain or shortness of breath Cardiac cath yesterday stable coronary anatomy with no high-grade stenosis.   Objective: Vital signs in last 24 hours: Temp:  [98.3 F (36.8 C)-99.1 F (37.3 C)] 98.9 F (37.2 C) (10/04 0500) Pulse Rate:  [0-77] 76 (10/04 0500) Resp:  [8-36] 18 (10/04 0500) BP: (140-180)/(66-104) 140/66 (10/04 0500) SpO2:  [0 %-100 %] 96 % (10/03 2050) Weight:  [86 kg] 86 kg (10/04 0500) Last BM Date: 08/30/18 General: NAD Lungs: Clear Heart: S1-S2 Abdomen: Soft, nontender no distention Ext: No edema    Intake/Output from previous day: 10/03 0701 - 10/04 0700 In: 240 [P.O.:240] Out: 350 [Urine:350] Intake/Output this shift: No intake/output data recorded.   Lab Results: Recent Labs    08/30/18 0417 08/31/18 0626 09/01/18 0546  WBC 5.7 4.9 5.5  HGB 8.1* 8.1* 8.1*  PLT 104* 96* 83*  MCV 91.8 92.1 92.5   BMET Recent Labs    08/30/18 0417 08/31/18 0626  NA 138 141  K 3.6 4.0  CL 106 109  CO2 24 24  GLUCOSE 191* 188*  BUN 8 8  CREATININE 0.92 0.94  CALCIUM 8.7* 8.6*   LFT Recent Labs    08/31/18 0626  PROT 6.4*  ALBUMIN 3.2*  AST 51*  ALT 37  ALKPHOS 64  BILITOT 0.8   PT/INR Recent Labs    08/30/18 0417  INR 1.19      Imaging/Other results: No results found.    Assessment &Plan  67 year old male admitted with unstable angina after abnormal stress test with ST depression.  Severe anemia hemoglobin 5.6 responded appropriately to 3 unit blood transfusion.  Currently hemoglobin stable at 8.1. 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  No evidence of decompensation.  Normal synthetic function.  Meld 6 Hepatitis A, B and C negative.  ANA negative.  Ferritin  30. Status post cardiac catheterization yesterday, showed normal cardiac anatomy with no high-grade stenosis.  Previous stent was widely patent Anemia likely multifactorial.  Possible intermittent GI blood loss secondary to chronic NSAID use and also portal hypertension. Will proceed with diagnostic EGD this morning. N.p.o. The risks and benefits as well as alternatives of endoscopic procedure(s) have been discussed and reviewed. All questions answered. The patient agrees to proceed.    Damaris Hippo , MD [phone]  [facility]

## 2018-09-01 NOTE — Discharge Summary (Signed)
Physician Discharge Summary  Sundiata Ferrick EUM:[id number] DOB: July 26, 1951  PCP: [name], MD  Admit date: 08/28/2018 Discharge date: 09/01/2018  Recommendations for Outpatient Follow-up:  1. Dr. Viviana Simpler, PCP in 1 week with repeat labs (CBC & CMP). 2. Alonza Bogus, PA-C/[facility] on 09/15/2018 at 10:30 AM. 3. Dr. Sherren Mocha, Cardiology in 2 weeks.  Home Health: None Equipment/Devices: None  Discharge Condition: Improved and stable CODE STATUS: Full Diet recommendation: Heart healthy and diabetic diet.  Discharge Diagnoses:  Active Problems:   Coronary artery disease involving native coronary artery of native heart with unstable angina pectoris (HCC)   GI bleed   Symptomatic anemia   Alcoholic cirrhosis of liver without ascites (HCC)   Secondary esophageal varices without bleeding (HCC)   Gastric ulcer due to nonsteroidal antiinflammatory drug (NSAID) therapy   Brief Summary: 67 year old independent male, PMH of CAD with complex two-vessel PCI in 2010, HTN, DM 2, HLD, arthritis for which he takes chronic NSAIDs/Aleve for years, GERD, OSA on CPAP, presented initially to cardiologist's office 08/21/2018 with couple of weeks history of progressive dizziness, lightheadedness, generalized weakness, dyspnea on exertion and chest discomfort.  At that time his antihypertensives were titrated down and he was scheduled for outpatient stress test.  On day of admission 10/1, during the stress test he experienced chest pain and noted to have ST depressions in the inferior lateral leads.  He denies syncope but reports episodes of near syncope.  Stress test was aborted and patient was sent to the hospital for urgent cardiac cath.  Hemoglobin 5.6 on admission.  He reports 3 episodes of black tarry stools over the last 4 weeks, last episode about a week PTA.  Colonoscopy 01/23/2018 with Dr.Dan Ardis Hughs was normal.  He was admitted for acute blood loss anemia due to subacute upper GI bleed and  chest pain.  Cardiology and [facility] were consulted.   Assessment & Plan:   Subacute Upper GI bleed: 3 episodes of melena in the 4 weeks PTA and last one approximately a week prior to admission.  [facility] consulted.  Abdominal ultrasound suggestive of cirrhosis.  INR normal/1.19.  After cardiac clearance, patient underwent EGD on 10/4 with findings as noted below.  Etiology of his bleeding could be related to the esophageal varices or gastric ulcers or erythematous duodenopathy.  He has had no further clinical GI bleed for several days now.  Patient has been advised to stop NSAID use and he verbalizes understanding.  GI recommends nadolol 20 mg daily and titrate based on heart rate to goal 50-60, Protonix 40 mg twice daily for 2 months followed by once daily, alcohol cessation, they have arranged outpatient follow-up and will follow pathology results.  Improved and stable.  Acute blood loss anemia/iron deficiency anemia: Suspected due to GI bleed.  Presented with hemoglobin of 5.6.  Transfused 3 units of PRBCs and hemoglobin appropriately up to 8.1.  Ferritin 30. Asymptomatic of anemia now.  Stable.  Consider starting iron supplements during outpatient follow-up, allowing for healing of ulcers.  Thrombocytopenia: Appears to be chronic.?  Related to cirrhosis?  Alcohol.    Follow CBCs as outpatient next week.  Chest pain/unstable angina/abnormal stress test/CAD: Cardiology was consulted.  They suspected that the abnormal stress test and symptoms were secondary to demand ischemia from severe anemia however CAD could not be definitively ruled out.  Thereby patient underwent diagnostic cardiac cath on 10/3 which revealed stable coronary anatomy without high-grade coronary stenosis and cardiology recommended medical therapy for  CAD and cleared for EGD.  GI discussed with cardiology and have switched beta-blockers from metoprolol to nadolol for his esophageal varices.  I discussed with GI who are okay  with him resuming aspirin 81 mg daily at discharge.  Improved and stable.  Essential hypertension:  Uncontrolled.  Resume losartan at discharge.  Metoprolol switched to nadolol at discharge.  Type II DM:  A1c: 7.  Better controlled.  Resume prior home dose of Lantus, oral hypoglycemics at discharge and outpatient follow-up.  Hyperlipidemia: Continue statins.  Arthritis: Advised patient that he should stop taking NSAIDs and use Tylenol.  If he needs something stronger than he will need to follow-up with his PCP.  He verbalized understanding.  OSA on CPAP  Hepatic cirrhosis:?  Related to alcohol.  Acute hepatitis panel: Negative.  Ferritin 30.  ANA: Negative.  Outpatient follow-up with GI.  Alcohol cessation counseled.  Alcohol abuse: Patient reports drinking 5-6 beers every other day, denies liquor.  Has been drinking for several years.  Reports that he last drank on 9/30.  Thiamine, folate and multivitamins.  No withdrawal features.   Consultants:  [facility]  Procedures:  EGD 09/01/2018:  Impression: Nonbleeding grade 2 esophageal varices.  Nonbleeding gastric ulcers with a clean ulcer base [facility] class III) biopsied.  Erythematous duodenopathy.  Normal second portion of the duodenum.  Diagnostic cardiac cath 08/31/2018.   Discharge Instructions  Discharge Instructions    Call MD for:   Complete by:  As directed    Vomiting blood, dark or coffee-ground material or having black tarry stools or blood in stools.   Call MD for:  difficulty breathing, headache or visual disturbances   Complete by:  As directed    Call MD for:  extreme fatigue   Complete by:  As directed    Call MD for:  persistant dizziness or light-headedness   Complete by:  As directed    Call MD for:  persistant nausea and vomiting   Complete by:  As directed    Call MD for:  severe uncontrolled pain   Complete by:  As directed    Diet - low sodium heart healthy   Complete by:  As  directed    Diet Carb Modified   Complete by:  As directed    Increase activity slowly   Complete by:  As directed        Medication List    STOP taking these medications   ALEVE 220 MG tablet Generic drug:  naproxen sodium   metoprolol succinate 25 MG 24 hr tablet Commonly known as:  TOPROL-XL     TAKE these medications   aspirin 81 MG tablet Take 81 mg by mouth daily.   folic acid 1 MG tablet Commonly known as:  FOLVITE Take 1 tablet (1 mg total) by mouth daily. Start taking on:  09/02/2018   glipiZIDE 5 MG tablet Commonly known as:  GLUCOTROL Take 1 tablet (5 mg total) by mouth 2 (two) times daily before a meal.   glucose blood test strip Use to test blood sugar 2 times daily as instructed. Dx: E11.59, E11.65   Insulin Glargine 100 UNIT/ML Solostar Pen Commonly known as:  LANTUS INJECT 30 UNITS SUBCUTANEOUSLY ONCE DAILY AT  10PM What changed:    how much to take  how to take this  when to take this  additional instructions   losartan 50 MG tablet Commonly known as:  COZAAR Take 1.5 tablets (75 mg total) by mouth daily.  metFORMIN 1000 MG tablet Commonly known as:  GLUCOPHAGE Take 1 tablet (1,000 mg total) by mouth 2 (two) times daily with a meal.   multivitamin with minerals Tabs tablet Take 1 tablet by mouth at bedtime.   nadolol 20 MG tablet Commonly known as:  CORGARD Take 1 tablet (20 mg total) by mouth daily. Start taking on:  09/02/2018   nitroGLYCERIN 0.4 MG SL tablet Commonly known as:  NITROSTAT Place 1 tablet (0.4 mg total) under the tongue every 5 (five) minutes as needed for chest pain.   ONETOUCH DELICA LANCETS FINE Misc Use to test blood sugar 2 times daily as instructed. Dx: E11.59, E11.65   pantoprazole 40 MG tablet Commonly known as:  PROTONIX Take 1 tablet (40 mg total) by mouth 2 (two) times daily.   PARoxetine 20 MG tablet Commonly known as:  PAXIL TAKE 2 TABLETS BY MOUTH ONCE DAILY FOR DEPRESSION What changed:  See  the new instructions.   rosuvastatin 10 MG tablet Commonly known as:  CRESTOR Take 1 tablet (10 mg total) by mouth daily.   sildenafil 20 MG tablet Commonly known as:  REVATIO Take 60-100 mg by mouth daily as needed (for ED).   thiamine 100 MG tablet Take 1 tablet (100 mg total) by mouth daily. Start taking on:  09/02/2018   triamcinolone cream 0.1 % Commonly known as:  KENALOG Apply 1 application topically 2 (two) times daily as needed (dry scalp).      Follow-up Information    Zehr, Laban Emperor, PA-C Follow up on 09/15/2018.   Specialty:  Gastroenterology Why:  10:30am Contact information: [place] [address] [phone]        [name], MD. Schedule an appointment as soon as possible for a visit in 1 week(s).   Specialties:  Internal Medicine, Pediatrics Why:  To be seen with repeat labs (CBC & CMP). Contact information: [place] [address] [phone]        Sherren Mocha, MD. Schedule an appointment as soon as possible for a visit in 2 week(s).   Specialty:  Cardiology Contact information: [address] [place] [address] [phone]          Allergies  Allergen Reactions  . Venlafaxine     Slowed his urine stream      Procedures/Studies: US Abdomen Limited Ruq  Result Date: 08/29/2018 CLINICAL DATA:  Initial evaluation for abnormal of these. Evaluate for cirrhosis or fatty liver. EXAM: ULTRASOUND ABDOMEN LIMITED RIGHT UPPER QUADRANT COMPARISON:  None. FINDINGS: Gallbladder: No gallstones or wall thickening visualized. No sonographic Murphy sign noted by sonographer. Common bile duct: Diameter: 4.8 mm Liver: Liver demonstrates a coarse heterogeneous and echogenic echotexture with nodular contour, suggestive of cirrhosis. No focal intrahepatic lesions identified. Portal vein is patent on color Doppler imaging with normal direction of blood flow towards the liver. IMPRESSION: 1.  Coarse echogenic echotexture of the liver with associated nodular contour, most suggestive of cirrhosis. No focal intrahepatic lesions identified. 2. Normal sonographic evaluation of the gallbladder. 3. No biliary dilatation. Electronically Signed   By: Jeannine Boga M.D.   On: 08/29/2018 15:40      Subjective: Patient denies complaints.  No nausea, vomiting or abdominal pain.  Tolerating diet.  Having normal colored BM.  No chest pain, dyspnea, palpitations, dizziness or lightheadedness.  Discharge Exam:  Vitals:   09/01/18 1255 09/01/18 1305 09/01/18 1315 09/01/18 1338  BP: (!) 160/80 (!) 183/90 [name])  185/65 (!) 164/98  Pulse: 77 74 73 70  Resp: 17 16 18 20   Temp:    98.4 F (36.9 C)  TempSrc:    Oral  SpO2: 98% 98% 99% 100%  Weight:      Height:        General exam: Pleasant middle-aged male, moderately built and nourished lying comfortably propped up in bed. Respiratory system: Clear to auscultation. Respiratory effort normal. Cardiovascular system: S1 & S2 heard, RRR. No JVD, murmurs, rubs, gallops or clicks. No pedal edema.   Gastrointestinal system: Abdomen is nondistended, soft and nontender. No organomegaly or masses felt. Normal bowel sounds heard. Central nervous system: Alert and oriented. No focal neurological deficits. Extremities: Symmetric 5 x 5 power. Skin: No rashes, lesions or ulcers Psychiatry: Judgement and insight appear normal. Mood & affect appropriate.     The results of significant diagnostics from this hospitalization (including imaging, microbiology, ancillary and laboratory) are listed below for reference.       Labs: CBC: Recent Labs  Lab 08/28/18 1320 08/29/18 0640 08/30/18 0417 08/31/18 0626 09/01/18 0546  WBC 5.0 5.3 5.7 4.9 5.5  NEUTROABS  --   --  3.4  --   --   HGB 5.6* 7.1* 8.1* 8.1* 8.1*  HCT 19.2* 22.6* 25.7* 25.6* 26.0*  MCV 95.0 90.8 91.8 92.1 92.5  PLT 125* 107* 104* 96* 83*   Basic Metabolic Panel: Recent Labs   Lab 08/28/18 1320 08/30/18 0417 08/31/18 0626  NA 137 138 141  K 3.9 3.6 4.0  CL 106 106 109  CO2 20* 24 24  GLUCOSE 287* 191* 188*  BUN 12 8 8   CREATININE 1.03 0.92 0.94  CALCIUM 8.6* 8.7* 8.6*   Liver Function Tests: Recent Labs  Lab 08/31/18 0626  AST 51*  ALT 37  ALKPHOS 64  BILITOT 0.8  PROT 6.4*  ALBUMIN 3.2*   CBG: Recent Labs  Lab 08/31/18 1122 08/31/18 1646 08/31/18 2147 09/01/18 0758 09/01/18 1336  GLUCAP 189* 117* 304* 189* 135*   Anemia work up Recent Labs    08/30/18 0705  FERRITIN 30  TIBC 487*  IRON 99      Time coordinating discharge: 40 minutes  SIGNED:  Vernell Leep, MD, FACP, [facility]. [facility] Pager [phone]  If 7PM-7AM, please contact night-coverage [email or web address] Password [email or web address] 09/01/2018, 4:22 PM

## 2018-09-01 NOTE — H&P (View-Only) (Signed)
[facility] ROUNDING NOTE   Subjective: No complaints.  Denies any chest pain or shortness of breath Cardiac cath yesterday stable coronary anatomy with no high-grade stenosis.   Objective: Vital signs in last 24 hours: Temp:  [98.3 F (36.8 C)-99.1 F (37.3 C)] 98.9 F (37.2 C) (10/04 0500) Pulse Rate:  [0-77] 76 (10/04 0500) Resp:  [8-36] 18 (10/04 0500) BP: (140-180)/(66-104) 140/66 (10/04 0500) SpO2:  [0 %-100 %] 96 % (10/03 2050) Weight:  [86 kg] 86 kg (10/04 0500) Last BM Date: 08/30/18 General: NAD Lungs: Clear Heart: S1-S2 Abdomen: Soft, nontender no distention Ext: No edema    Intake/Output from previous day: 10/03 0701 - 10/04 0700 In: 240 [P.O.:240] Out: 350 [Urine:350] Intake/Output this shift: No intake/output data recorded.   Lab Results: Recent Labs    08/30/18 0417 08/31/18 0626 09/01/18 0546  WBC 5.7 4.9 5.5  HGB 8.1* 8.1* 8.1*  PLT 104* 96* 83*  MCV 91.8 92.1 92.5   BMET Recent Labs    08/30/18 0417 08/31/18 0626  NA 138 141  K 3.6 4.0  CL 106 109  CO2 24 24  GLUCOSE 191* 188*  BUN 8 8  CREATININE 0.92 0.94  CALCIUM 8.7* 8.6*   LFT Recent Labs    08/31/18 0626  PROT 6.4*  ALBUMIN 3.2*  AST 51*  ALT 37  ALKPHOS 64  BILITOT 0.8   PT/INR Recent Labs    08/30/18 0417  INR 1.19      Imaging/Other results: No results found.    Assessment &Plan  67 year old male admitted with unstable angina after abnormal stress test with ST depression.  Severe anemia hemoglobin 5.6 responded appropriately to 3 unit blood transfusion.  Currently hemoglobin stable at 8.1. 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  No evidence of decompensation.  Normal synthetic function.  Meld 6 Hepatitis A, B and C negative.  ANA negative.  Ferritin  30. Status post cardiac catheterization yesterday, showed normal cardiac anatomy with no high-grade stenosis.  Previous stent was widely patent Anemia likely multifactorial.  Possible intermittent GI blood loss secondary to chronic NSAID use and also portal hypertension. Will proceed with diagnostic EGD this morning. N.p.o. The risks and benefits as well as alternatives of endoscopic procedure(s) have been discussed and reviewed. All questions answered. The patient agrees to proceed.    David [name] , MD [phone]  [facility]

## 2018-09-01 NOTE — Op Note (Signed)
[facility] Patient Name: David [name] Procedure Date : 09/01/2018 MRN: [id number] Attending MD: Mauri Pole , MD Date of Birth: [birth year] CSN: [id number] Age: 67 Admit Type: Inpatient Procedure:                Upper GI endoscopy Indications:              Recent gastrointestinal bleeding Providers:                Mauri Pole, MD, Carlyn Reichert, RN, Cherylynn Ridges, Technician Referring MD:              Medicines:                Monitored Anesthesia Care Complications:            No immediate complications. Estimated Blood Loss:     Estimated blood loss was minimal. Procedure:                Pre-Anesthesia Assessment:                           - Prior to the procedure, a History and Physical                            was performed, and patient medications and                            allergies were reviewed. The patient's tolerance of                            previous anesthesia was also reviewed. The risks                            and benefits of the procedure and the sedation                            options and risks were discussed with the patient.                            All questions were answered, and informed consent                            was obtained. Prior Anticoagulants: The patient has                            taken no previous anticoagulant or antiplatelet                            agents. ASA Grade Assessment: III - A patient with                            severe systemic disease. After reviewing the risks  and benefits, the patient was deemed in                            satisfactory condition to undergo the procedure.                           After obtaining informed consent, the endoscope was                            passed under direct vision. Throughout the                            procedure, the patient's blood pressure, pulse, and   oxygen saturations were monitored continuously. The                            [id number]-[id number] ([id number]) Olympus Adult EGD was introduced                            through the mouth, and advanced to the second part                            of duodenum. The upper GI endoscopy was                            accomplished without difficulty. The patient                            tolerated the procedure well. Scope In: Scope Out: Findings:      Three columns of non-bleeding grade II varices were found in the mid       esophagus and in the distal esophagus, 35 to 42 cm from the incisors.       They were less than 5 mm in largest diameter. No stigmata of recent       bleeding were evident and no red wale signs were present.      Few non-bleeding superficial gastric ulcers with a clean ulcer base       (Forrest Class III) were found in the gastric antrum and in the       prepyloric region of the stomach. Biopsies were taken with a cold       forceps for Helicobacter pylori testing. No gastric varices      Patchy mildly erythematous mucosa without active bleeding and with no       stigmata of bleeding was found in the duodenal bulb.      The second portion of the duodenum was normal. Impression:               - Non-bleeding grade II esophageal varices.                           - Non-bleeding gastric ulcers with a clean ulcer                            base (Forrest Class III). Biopsied.                           -  Erythematous duodenopathy.                           - Normal second portion of the duodenum. Recommendation:           - Patient has a contact number available for                            emergencies. The signs and symptoms of potential                            delayed complications were discussed with the                            patient. Return to normal activities tomorrow.                            Written discharge instructions were provided to the                             patient.                           - Resume previous diet.                           - Continue present medications.                           - No ibuprofen, naproxen, or other non-steroidal                            anti-inflammatory drugs.                           - Await pathology results.                           - Nadolol 20mg  daily and will titrate based on HR                            to goal 50-60                           - Protonix 40mg  twice daily before breakfast and                            dinner X 2 months followed by once daily                           - Alcohol cessation                           - Will arrange for follow up in GI office in 2                            weeks (Dr Ardis Hughs  or APP)                           - OK to discharge home later today from GI                            standpoint Procedure Code(s):        --- Professional ---                           [phone], Esophagogastroduodenoscopy, flexible,                            transoral; with biopsy, single or multiple Diagnosis Code(s):        --- Professional ---                           I85.00, Esophageal varices without bleeding                           K25.9, Gastric ulcer, unspecified as acute or                            chronic, without hemorrhage or perforation                           K31.89, Other diseases of stomach and duodenum                           K92.2, Gastrointestinal hemorrhage, unspecified CPT copyright 2017 American Medical Association. All rights reserved. The codes documented in this report are preliminary and upon coder review may  be revised to meet current compliance requirements. Mauri Pole, MD 09/01/2018 12:56:04 PM This report has been signed electronically. Number of Addenda: 0

## 2018-09-01 NOTE — Interval H&P Note (Signed)
History and Physical Interval Note:  09/01/2018 12:08 PM  David [name]  has presented today for surgery, with the diagnosis of Anemia symptomatic  The various methods of treatment have been discussed with the patient and family. After consideration of risks, benefits and other options for treatment, the patient has consented to  Procedure(s): ESOPHAGOGASTRODUODENOSCOPY (EGD) WITH PROPOFOL (N/A) as a surgical intervention .  The patient's history has been reviewed, patient examined, no change in status, stable for surgery.  I have reviewed the patient's chart and labs.  Questions were answered to the patient's satisfaction.     Kavitha Nandigam

## 2018-09-01 NOTE — Care Management Important Message (Signed)
Important Message  Patient Details  Name: David [name] MRN: [id number] Date of Birth: 1951/05/16   Medicare Important Message Given:  Yes    Erenest Rasher, RN 09/01/2018, 3:10 PM

## 2018-09-01 NOTE — Anesthesia Preprocedure Evaluation (Signed)
Anesthesia Evaluation  Patient identified by MRN, date of birth, ID band Patient awake    Reviewed: Allergy & Precautions, NPO status , Patient's Chart, lab work & pertinent test results  Airway Mallampati: II  TM Distance: >3 FB Neck ROM: Full    Dental  (+) Dental Advisory Given   Pulmonary sleep apnea and Continuous Positive Airway Pressure Ventilation , former smoker,    breath sounds clear to auscultation       Cardiovascular hypertension, Pt. on medications and Pt. on home beta blockers + angina + CAD, + Past MI and + Cardiac Stents   Rhythm:Regular Rate:Normal  Impressions: - Normal to hyperdynamic LV function, no regional wall motion  abnormalities. Mildly dilated ascending aorta. Moderately dilated  RV with normal to hyperdynamic function.   Neuro/Psych negative neurological ROS     GI/Hepatic Neg liver ROS, GERD  ,Symptomatic anemia. ?GI bleed   Endo/Other  diabetes, Type 2  Renal/GU negative Renal ROS     Musculoskeletal  (+) Arthritis ,   Abdominal   Peds  Hematology  (+) Blood dyscrasia, anemia ,   Anesthesia Other Findings   Reproductive/Obstetrics                             Lab Results  Component Value Date   WBC 5.5 09/01/2018   HGB 8.1 (L) 09/01/2018   HCT 26.0 (L) 09/01/2018   MCV 92.5 09/01/2018   PLT 83 (L) 09/01/2018   Lab Results  Component Value Date   CREATININE 0.94 08/31/2018   BUN 8 08/31/2018   NA 141 08/31/2018   K 4.0 08/31/2018   CL 109 08/31/2018   CO2 24 08/31/2018    Anesthesia Physical Anesthesia Plan  ASA: IV  Anesthesia Plan: MAC   Post-op Pain Management:    Induction: Intravenous  PONV Risk Score and Plan: 1 and Propofol infusion, Ondansetron and Treatment may vary due to age or medical condition  Airway Management Planned: Natural Airway and Nasal Cannula  Additional Equipment:   Intra-op Plan:   Post-operative Plan:    Informed Consent: I have reviewed the patients History and Physical, chart, labs and discussed the procedure including the risks, benefits and alternatives for the proposed anesthesia with the patient or authorized representative who has indicated his/her understanding and acceptance.     Plan Discussed with: CRNA  Anesthesia Plan Comments:         Anesthesia Quick Evaluation

## 2018-09-01 NOTE — Progress Notes (Signed)
Discussed with cardiology regarding switching BB from metoprolol to non-selective BB.  Ok by them to start with nadolol 20 mg daily.  I started that and discontinued metoprolol.

## 2018-09-01 NOTE — Transfer of Care (Signed)
Immediate Anesthesia Transfer of Care Note  Patient: David [name]  Procedure(s) Performed: ESOPHAGOGASTRODUODENOSCOPY (EGD) WITH PROPOFOL (N/A ) BIOPSY  Patient Location: Endoscopy Unit  Anesthesia Type:MAC  Level of Consciousness: awake, alert  and oriented  Airway & Oxygen Therapy: Patient Spontanous Breathing and Patient connected to nasal cannula oxygen  Post-op Assessment: Report given to RN and Post -op Vital signs reviewed and stable  Post vital signs: Reviewed and stable  Last Vitals:  Vitals Value Taken Time  BP 121/56 09/01/2018 12:46 PM  Temp 36.8 C 09/01/2018 12:46 PM  Pulse 76 09/01/2018 12:46 PM  Resp 15 09/01/2018 12:46 PM  SpO2 100 % 09/01/2018 12:46 PM    Last Pain:  Vitals:   09/01/18 1246  TempSrc: Oral  PainSc: 0-No pain         Complications: No apparent anesthesia complications

## 2018-09-01 NOTE — Plan of Care (Signed)
  Problem: Clinical Measurements: Goal: Diagnostic test results will improve Outcome: Progressing   Problem: Clinical Measurements: Goal: Cardiovascular complication will be avoided Outcome: Progressing

## 2018-09-02 ENCOUNTER — Encounter (HOSPITAL_COMMUNITY): Payer: Self-pay | Admitting: Gastroenterology

## 2018-09-02 ENCOUNTER — Other Ambulatory Visit: Payer: Self-pay | Admitting: Internal Medicine

## 2018-09-02 DIAGNOSIS — E1159 Type 2 diabetes mellitus with other circulatory complications: Secondary | ICD-10-CM

## 2018-09-02 DIAGNOSIS — E1165 Type 2 diabetes mellitus with hyperglycemia: Principal | ICD-10-CM

## 2018-09-04 ENCOUNTER — Telehealth: Payer: Self-pay | Admitting: *Deleted

## 2018-09-04 NOTE — Telephone Encounter (Signed)
Lm requesting return call to complete TCM and confirm hosp f/u appt  

## 2018-09-05 NOTE — Telephone Encounter (Signed)
Lm requesting return call to complete TCM and confirm hosp f/u appt  

## 2018-09-06 MED ORDER — CARVEDILOL 3.125 MG PO TABS: 3.1250 mg | ORAL_TABLET | Freq: Two times a day (BID) | ORAL | 3 refills | Status: DC

## 2018-09-07 ENCOUNTER — Ambulatory Visit: Payer: Self-pay | Admitting: Cardiology

## 2018-09-08 ENCOUNTER — Encounter: Payer: Self-pay | Admitting: Physician Assistant

## 2018-09-11 NOTE — Anesthesia Postprocedure Evaluation (Signed)
Anesthesia Post Note  Patient: David [name]  Procedure(s) Performed: ESOPHAGOGASTRODUODENOSCOPY (EGD) WITH PROPOFOL (N/A ) BIOPSY     Patient location during evaluation: PACU Anesthesia Type: MAC Level of consciousness: awake and alert Pain management: pain level controlled Vital Signs Assessment: post-procedure vital signs reviewed and stable Respiratory status: spontaneous breathing, nonlabored ventilation, respiratory function stable and patient connected to nasal cannula oxygen Cardiovascular status: stable and blood pressure returned to baseline Postop Assessment: no apparent nausea or vomiting Anesthetic complications: no    Last Vitals:  Vitals:   09/01/18 1315 09/01/18 1338  BP: (!) 185/65 (!) 164/98  Pulse: 73 70  Resp: 18 20  Temp:  36.9 C  SpO2: 99% 100%    Last Pain:  Vitals:   09/01/18 1338  TempSrc: Oral  PainSc:                  Tiajuana Amass

## 2018-09-12 ENCOUNTER — Encounter: Payer: Self-pay | Admitting: Gastroenterology

## 2018-09-15 ENCOUNTER — Ambulatory Visit: Payer: Medicare Other | Admitting: Gastroenterology

## 2018-09-15 ENCOUNTER — Encounter: Payer: Self-pay | Admitting: Gastroenterology

## 2018-09-15 ENCOUNTER — Other Ambulatory Visit (INDEPENDENT_AMBULATORY_CARE_PROVIDER_SITE_OTHER): Payer: Medicare Other

## 2018-09-15 VITALS — BP 110/64 | HR 82 | Ht 69.0 in | Wt 185.0 lb

## 2018-09-15 DIAGNOSIS — R059 Cough, unspecified: Secondary | ICD-10-CM | POA: Insufficient documentation

## 2018-09-15 DIAGNOSIS — Z8719 Personal history of other diseases of the digestive system: Secondary | ICD-10-CM | POA: Diagnosis not present

## 2018-09-15 DIAGNOSIS — D649 Anemia, unspecified: Secondary | ICD-10-CM

## 2018-09-15 DIAGNOSIS — K7030 Alcoholic cirrhosis of liver without ascites: Secondary | ICD-10-CM | POA: Diagnosis not present

## 2018-09-15 DIAGNOSIS — R05 Cough: Secondary | ICD-10-CM | POA: Diagnosis not present

## 2018-09-15 LAB — CBC WITH DIFFERENTIAL/PLATELET
BASOS PCT: 0.6 % (ref 0.0–3.0)
Basophils Absolute: 0 10*3/uL (ref 0.0–0.1)
EOS ABS: 0.3 10*3/uL (ref 0.0–0.7)
Eosinophils Relative: 4.7 % (ref 0.0–5.0)
HCT: 28.8 % — ABNORMAL LOW (ref 39.0–52.0)
HEMOGLOBIN: 9.6 g/dL — AB (ref 13.0–17.0)
Lymphocytes Relative: 17.3 % (ref 12.0–46.0)
Lymphs Abs: 1.2 10*3/uL (ref 0.7–4.0)
MCHC: 33.4 g/dL (ref 30.0–36.0)
MCV: 88.2 fl (ref 78.0–100.0)
MONO ABS: 1.2 10*3/uL — AB (ref 0.1–1.0)
Monocytes Relative: 17.2 % — ABNORMAL HIGH (ref 3.0–12.0)
Neutro Abs: 4.2 10*3/uL (ref 1.4–7.7)
Neutrophils Relative %: 60.2 % (ref 43.0–77.0)
Platelets: 191 10*3/uL (ref 150.0–400.0)
RBC: 3.27 Mil/uL — ABNORMAL LOW (ref 4.22–5.81)
RDW: 18.3 % — AB (ref 11.5–15.5)
WBC: 7 10*3/uL (ref 4.0–10.5)

## 2018-09-15 MED ORDER — PROPRANOLOL HCL 10 MG PO TABS: 10.0000 mg | ORAL_TABLET | Freq: Three times a day (TID) | ORAL | 5 refills | Status: DC

## 2018-09-15 NOTE — Patient Instructions (Addendum)
  We have sent the following medications to your pharmacy for you to pick up at your convenience: Propranolol   Take your pantoprazole twice a day for two months total then decrease to once daily.   Stop your carvedilol and start the propranolol.   You can try over the counter delsym cough suppressant: 2 teaspoons every 12 hours.   Please follow up with Dr. Ardis Hughs in 3 months.    I appreciate the opportunity to care for you. Alonza Bogus, PA-C

## 2018-09-15 NOTE — Progress Notes (Signed)
09/15/2018 David [name] [id number] 28-Apr-1951   HISTORY OF PRESENT ILLNESS: This is a 67 year old male who is a patient of Dr. Ardis Hughs.  He is here today for hospital follow-up.  Has ETOH cirrhosis.  Was recently hospitalized for upper GI bleed/anemia.  On admission he was found to have a hemoglobin of 5.6 g.  He was transfused with 3 units of packed red blood cells.  Discharge hemoglobin was 8.1 g.  He underwent EGD while he was there that showed the following:  -Non-bleeding grade II esophageal varices. - Non-bleeding gastric ulcers with a clean ulcer base (Forrest Class III). Biopsied. - Erythematous duodenopathy. - Normal second portion of the duodenum.  Biopsies showed reactive gastropathy with no Hpylori.  Has not been drinking any alcohol.  Denies seeing any black or bloody stools.  Ultrasound showed coarse liver echotexture with associated nodular contour c/w cirrhosis, but no lesions identified.   Past Medical History:  Diagnosis Date  . Allergy   . Arthritis   . CAD (coronary artery disease)    s/p bifurcation stenting (DES x2) and stenting bifurcation lesion distal RCA (1 DES)  . Cataract   . Depression   . Diabetes mellitus   . GERD (gastroesophageal reflux disease)   . Hyperlipemia   . Hypertension   . Myocardial infarction ([place])   . Sleep apnea    CPAP   Past Surgical History:  Procedure Laterality Date  . BIOPSY  09/01/2018   Procedure: BIOPSY;  Surgeon: Mauri Pole, MD;  Location: [facility];  Service: Endoscopy;;  . CATARACT EXTRACTION Bilateral   . CORONARY ANGIOPLASTY WITH STENT PLACEMENT   March 2010   Dr. Olevia Perches  . ESOPHAGOGASTRODUODENOSCOPY (EGD) WITH PROPOFOL N/A 09/01/2018   Procedure: ESOPHAGOGASTRODUODENOSCOPY (EGD) WITH PROPOFOL;  Surgeon: Mauri Pole, MD;  Location: [facility];  Service: Endoscopy;  Laterality: N/A;  . LEFT HEART CATH AND CORONARY ANGIOGRAPHY N/A 08/31/2018   Procedure: LEFT HEART CATH AND CORONARY  ANGIOGRAPHY;  Surgeon: Sherren Mocha, MD;  Location: [facility];  Service: Cardiovascular;  Laterality: N/A;  . TONSILLECTOMY  1959    reports that he quit smoking about 34 years ago. His smoking use included cigarettes. He has never used smokeless tobacco. He reports that he drinks about 20.0 standard drinks of alcohol per week. He reports that he does not use drugs. family history includes Alcohol abuse in his mother; Cancer in his brother and father; Diabetes in his father; Heart failure in his father and mother; Schizophrenia in his son; Uveitis in his son. Allergies  Allergen Reactions  . Venlafaxine     Slowed his urine stream      Outpatient Encounter Medications as of 09/15/2018  Medication Sig  . aspirin 81 MG tablet Take 81 mg by mouth daily.    . folic acid (FOLVITE) 1 MG tablet Take 1 tablet (1 mg total) by mouth daily.  [name] glipiZIDE (GLUCOTROL) 5 MG tablet Take 1 tablet (5 mg total) by mouth 2 (two) times daily before a meal.  . glucose blood (ONE TOUCH ULTRA TEST) test strip Use to test blood sugar 2 times daily as instructed. Dx: E11.59, E11.65  . Insulin Glargine (LANTUS SOLOSTAR) 100 UNIT/ML Solostar Pen INJECT 30 UNITS SUBCUTANEOUSLY ONCE DAILY AT  10PM (Patient taking differently: Inject 34 Units into the skin at bedtime. )  . LANTUS SOLOSTAR 100 UNIT/ML Solostar Pen INJECT 34 UNITS SUBCUTANEOUSLY ONCE DAILY AT  10PM  . losartan (COZAAR) 50 MG tablet Take  1.5 tablets (75 mg total) by mouth daily.  . metFORMIN (GLUCOPHAGE) 1000 MG tablet Take 1 tablet (1,000 mg total) by mouth 2 (two) times daily with a meal.  . Multiple Vitamin (MULTIVITAMIN WITH MINERALS) TABS tablet Take 1 tablet by mouth at bedtime.  . nitroGLYCERIN (NITROSTAT) 0.4 MG SL tablet Place 1 tablet (0.4 mg total) under the tongue every 5 (five) minutes as needed for chest pain.  Glory Rosebush DELICA LANCETS FINE MISC Use to test blood sugar 2 times daily as instructed. Dx: E11.59, E11.65  . pantoprazole  (PROTONIX) 40 MG tablet Take 1 tablet (40 mg total) by mouth 2 (two) times daily.  [name] PARoxetine (PAXIL) 20 MG tablet TAKE 2 TABLETS BY MOUTH ONCE DAILY FOR DEPRESSION (Patient taking differently: Take 40 mg by mouth daily. )  . rosuvastatin (CRESTOR) 10 MG tablet Take 1 tablet (10 mg total) by mouth daily.  . sildenafil (REVATIO) 20 MG tablet Take 60-100 mg by mouth daily as needed (for ED).   [name] thiamine 100 MG tablet Take 1 tablet (100 mg total) by mouth daily.  [name] triamcinolone cream (KENALOG) 0.1 % Apply 1 application topically 2 (two) times daily as needed (dry scalp).  . [DISCONTINUED] carvedilol (COREG) 3.125 MG tablet Take 1 tablet (3.125 mg total) by mouth 2 (two) times daily.  . propranolol (INDERAL) 10 MG tablet Take 1 tablet (10 mg total) by mouth 3 (three) times daily.   No facility-administered encounter medications on file as of 09/15/2018.      REVIEW OF SYSTEMS  : All other systems reviewed and negative except where noted in the History of Present Illness.   PHYSICAL EXAM: BP 110/64   Pulse 82   Ht 5\' 9"  (1.753 m)   Wt 185 lb (83.9 kg)   BMI 27.32 kg/m  General: Well developed white male in no acute distress Head: Normocephalic and atraumatic Eyes:  Sclerae anicteric, conjunctiva pink. Ears: Normal auditory acuity Lungs: Clear throughout to auscultation; no increased WOB. Heart: Regular rate and rhythm; no M/R/G. Abdomen: Soft, non-distended.  BS present.  Non-tender. Musculoskeletal: Symmetrical with no gross deformities  Skin: No lesions on visible extremities Extremities: No edema  Neurological: Alert oriented x 4, grossly non-focal Psychological:  Alert and cooperative. Normal mood and affect  ASSESSMENT AND PLAN: *ETOH cirrhosis with esophageal varices and thrombocytopenia:  Found to have grade 2 esophageal varices on EGD: We had placed him on nadolol, but insurance would not cover it so cardiology changed him to carvedilol.  Our next preference will be  propanolol.  We will send a prescription for propanolol 10 mg 3 times daily and see if that will be covered.  If that still expensive then he can remain on the carvedilol, however, this is more of a third choice for treatment of and prevention of esophageal varices.  He will call us to inform us of propanolol price and if he is starting that medication or if it was expensive and he is going to remain on carvedilol.  To remain off of alcohol.  Ultrasound ok.  Will need AFP at next visit as he has not had this performed and did not realize until after his visit. *Anemia:  S/p UGIB with clean based gastric ulcers noted.  Will recheck CBC today. *Cough:  Lungs are clear.  Recommended Delsym OTC 2 tsp every 12 hours.  Needs to see PCP for other evaluation/treatment.  **We will follow-up with Dr. Ardis Hughs in 3 months.  May need titration of his  beta-blocker to higher doses if tolerated.   CC:  [name], MD

## 2018-09-19 NOTE — Progress Notes (Signed)
I agree with the above note, plan 

## 2018-09-22 ENCOUNTER — Encounter: Payer: Self-pay | Admitting: Internal Medicine

## 2018-09-22 ENCOUNTER — Ambulatory Visit: Payer: Medicare Other | Admitting: Internal Medicine

## 2018-09-22 ENCOUNTER — Ambulatory Visit (INDEPENDENT_AMBULATORY_CARE_PROVIDER_SITE_OTHER)
Admission: RE | Admit: 2018-09-22 | Discharge: 2018-09-22 | Disposition: A | Discharge status: Home / Self Care | Payer: Medicare Other | Source: Ambulatory Visit | Attending: Internal Medicine | Admitting: Internal Medicine

## 2018-09-22 VITALS — BP 122/84 | HR 63 | Temp 98.4°F | Resp 18 | Ht 69.0 in | Wt 182.0 lb

## 2018-09-22 DIAGNOSIS — R05 Cough: Secondary | ICD-10-CM | POA: Diagnosis not present

## 2018-09-22 DIAGNOSIS — R059 Cough, unspecified: Secondary | ICD-10-CM

## 2018-09-22 MED ORDER — HYDROCODONE-HOMATROPINE 5-1.5 MG/5ML PO SYRP: 5.0000 mL | ORAL_SOLUTION | Freq: Every evening | ORAL | 0 refills | Status: DC | PRN

## 2018-09-22 MED ORDER — AMOXICILLIN-POT CLAVULANATE 875-125 MG PO TABS: 1.0000 | ORAL_TABLET | Freq: Two times a day (BID) | ORAL | 0 refills | Status: DC

## 2018-09-22 MED ORDER — PAROXETINE HCL 20 MG PO TABS: ORAL_TABLET | ORAL | 3 refills | Status: DC

## 2018-09-22 NOTE — Assessment & Plan Note (Addendum)
Started shortly after hospitalization Some lung findings though no fever, dyspnea Will check CXR No apparent pneumonia Will treat with augmentin for sinuses Cough syrup also

## 2018-09-22 NOTE — Progress Notes (Signed)
Subjective:    Patient ID: David [name], adult    DOB: 01/27/1951, 67 y.o.   MRN: [id number]  HPI Here due to persistent cough Goes back about 3 weeks----shortly after going home from the hospital Worse at night---can't sleep in bed Productive at times No SOB now---anemia is improving  No fever No chills or sweats Achy and congested in head Has post nasal drip---better if sitting up  Didn't go to urgent care Mucinex, dayquil, nyquil  Current Outpatient Medications on File Prior to Visit  Medication Sig Dispense Refill  . aspirin 81 MG tablet Take 81 mg by mouth daily.      . folic acid (FOLVITE) 1 MG tablet Take 1 tablet (1 mg total) by mouth daily. 30 tablet 0  . glipiZIDE (GLUCOTROL) 5 MG tablet Take 1 tablet (5 mg total) by mouth 2 (two) times daily before a meal. 180 tablet 3  . glucose blood (ONE TOUCH ULTRA TEST) test strip Use to test blood sugar 2 times daily as instructed. Dx: E11.59, E11.65 100 each 5  . Insulin Glargine (LANTUS SOLOSTAR) 100 UNIT/ML Solostar Pen INJECT 30 UNITS SUBCUTANEOUSLY ONCE DAILY AT  10PM (Patient taking differently: Inject 34 Units into the skin at bedtime. ) 15 pen 5  . LANTUS SOLOSTAR 100 UNIT/ML Solostar Pen INJECT 34 UNITS SUBCUTANEOUSLY ONCE DAILY AT  10PM 15 pen 5  . losartan (COZAAR) 50 MG tablet Take 1.5 tablets (75 mg total) by mouth daily. 135 tablet 3  . metFORMIN (GLUCOPHAGE) 1000 MG tablet Take 1 tablet (1,000 mg total) by mouth 2 (two) times daily with a meal. 180 tablet 3  . Multiple Vitamin (MULTIVITAMIN WITH MINERALS) TABS tablet Take 1 tablet by mouth at bedtime.    . nitroGLYCERIN (NITROSTAT) 0.4 MG SL tablet Place 1 tablet (0.4 mg total) under the tongue every 5 (five) minutes as needed for chest pain.    Glory Rosebush DELICA LANCETS FINE MISC Use to test blood sugar 2 times daily as instructed. Dx: E11.59, E11.65 100 each 5  . pantoprazole (PROTONIX) 40 MG tablet Take 1 tablet (40 mg total) by mouth 2 (two) times daily. 60 tablet  1  . PARoxetine (PAXIL) 20 MG tablet TAKE 2 TABLETS BY MOUTH ONCE DAILY FOR DEPRESSION (Patient taking differently: Take 40 mg by mouth daily. ) 60 tablet 11  . propranolol (INDERAL) 10 MG tablet Take 1 tablet (10 mg total) by mouth 3 (three) times daily. 90 tablet 5  . rosuvastatin (CRESTOR) 10 MG tablet Take 1 tablet (10 mg total) by mouth daily. 90 tablet 2  . sildenafil (REVATIO) 20 MG tablet Take 60-100 mg by mouth daily as needed (for ED).     [name] thiamine 100 MG tablet Take 1 tablet (100 mg total) by mouth daily. 30 tablet 0  . triamcinolone cream (KENALOG) 0.1 % Apply 1 application topically 2 (two) times daily as needed (dry scalp).     No current facility-administered medications on file prior to visit.     Allergies  Allergen Reactions  . Venlafaxine     Slowed his urine stream    Past Medical History:  Diagnosis Date  . Allergy   . Arthritis   . CAD (coronary artery disease)    s/p bifurcation stenting (DES x2) and stenting bifurcation lesion distal RCA (1 DES)  . Cataract   . Depression   . Diabetes mellitus   . GERD (gastroesophageal reflux disease)   . Hyperlipemia   . Hypertension   .  Myocardial infarction ([place])   . Sleep apnea    CPAP    Past Surgical History:  Procedure Laterality Date  . BIOPSY  09/01/2018   Procedure: BIOPSY;  Surgeon: Mauri Pole, MD;  Location: [facility];  Service: Endoscopy;;  . CATARACT EXTRACTION Bilateral   . CORONARY ANGIOPLASTY WITH STENT PLACEMENT   March 2010   Dr. Olevia Perches  . ESOPHAGOGASTRODUODENOSCOPY (EGD) WITH PROPOFOL N/A 09/01/2018   Procedure: ESOPHAGOGASTRODUODENOSCOPY (EGD) WITH PROPOFOL;  Surgeon: Mauri Pole, MD;  Location: [facility];  Service: Endoscopy;  Laterality: N/A;  . LEFT HEART CATH AND CORONARY ANGIOGRAPHY N/A 08/31/2018   Procedure: LEFT HEART CATH AND CORONARY ANGIOGRAPHY;  Surgeon: Sherren Mocha, MD;  Location: [facility];  Service: Cardiovascular;  Laterality: N/A;  .  TONSILLECTOMY  1959    Family History  Problem Relation Age of Onset  . Heart failure Father        Deceased 70 y/o  . Diabetes Father   . Cancer Father        Prostate  . Heart failure Mother        Deceased 12 y/o  . Alcohol abuse Mother   . Schizophrenia Son        Paranoid  . Uveitis Son   . Cancer Brother        prostate  . Colon cancer Neg Hx   . Esophageal cancer Neg Hx   . Stomach cancer Neg Hx   . Liver cancer Neg Hx   . Pancreatic cancer Neg Hx   . Rectal cancer Neg Hx     Social History   Socioeconomic History  . Marital status: Married    Spouse name: Not on file  . Number of children: Not on file  . Years of education: Not on file  . Highest education level: Not on file  Occupational History  . Occupation: Patent examiner buildings    Comment: site closed  . Occupation: Engineering geologist  Social Needs  . Financial resource strain: Not on file  . Food insecurity:    Worry: Not on file    Inability: Not on file  . Transportation needs:    Medical: Not on file    Non-medical: Not on file  Tobacco Use  . Smoking status: Former Smoker    Types: Cigarettes    Last attempt to quit: 11/30/1983    Years since quitting: 34.8  . Smokeless tobacco: Never Used  Substance and Sexual Activity  . Alcohol use: Yes    Alcohol/week: 20.0 standard drinks    Types: 20 Standard drinks or equivalent per week    Comment: beer  . Drug use: No  . Sexual activity: Not on file  Lifestyle  . Physical activity:    Days per week: Not on file    Minutes per session: Not on file  . Stress: Not on file  Relationships  . Social connections:    Talks on phone: Not on file    Gets together: Not on file    Attends religious service: Not on file    Active member of club or organization: Not on file    Attends meetings of clubs or organizations: Not on file    Relationship status: Not on file  . Intimate partner violence:    Fear of current or ex partner: Not on file      Emotionally abused: Not on file    Physically abused: Not on file    Forced  sexual activity: Not on file  Other Topics Concern  . Not on file  Social History Narrative   No living will   Would want wife, then son [name], as health care POA   Would accept resuscitation   Not sure about tube feeds   Review of Systems  No rash Some pain in left groin or LLQ with the cough--thinks he pulled something Bowels back to normal---normal color and consistency No N/V Appetite is fair     Objective:   Physical Exam  Constitutional: No distress.  Coarse cough  HENT:  Mild maxillary tenderness TMs normal Mild nasal inflammation---some yellow discharge Slight pharyngeal petechiae  Neck: No thyromegaly present.  Small, slightly tender anterior cervical nodes  Respiratory:  Decreased breath sounds RR ~10 ??slight RLL crackles           Assessment & Plan:

## 2018-09-24 NOTE — Progress Notes (Signed)
Cardiology Office Note:    Date:  09/25/2018   ID:  David [name], DOB 1951/11/26, MRN [id number]  PCP:  [name], MD  Cardiologist:  Sherren Mocha, MD   Electrophysiologist:  None  GI:  Dr. Ardis Hughs  Referring MD: [name], MD   Chief Complaint  Patient presents with  . Hospitalization Follow-up    GI bleed, s/p cardiac cath     History of Present Illness:    David [name] is a 67 y.o. adult with coronary artery disease s/p complex 2 vessel PCI in 2010, hypertension, diabetes, hyperlipidemia, alcohol abuse.  He had recently been seen in clinic for shortness of breath and L sided chest pain.  A Nuclear stress test stress test was obtained and was noted to be markedly abnormal with chest pain in stage 1 and 1-2 mm horizontal inferolateral ST depression.  He was admitted for Cardiac Catheterization.  However, his hemoglobin was 5.6 on admission.  An abdominal ultrasound suggested cirrhosis.  An EGD demonstrated Gr 2 esophageal varices, nonbleeding gastric ulcers and erythematous duodenopathy.  He was asked to DC NSAID use.  He was transfused 3 units of PRBCs.  Metoprolol was changed to Nadolol upon recommendations by GI.  Cardiac Catheterization demonstrated patent stents in the LCx and RCA with mild restenosis.  Medical Rx was recommended for his coronary artery disease.    Mr. Harm returns for post hospital follow up.  He is here alone.  He has seen GI in follow up. His insurance would not cover Nadolol and he was switched to Propranolol.  He is doing well with this.  He does feel better.  He denies chest pain, significant shortness of breath, dizziness.  He denies syncope.  He has not had any further melena.    Prior CV studies:   The following studies were reviewed today:  Cardiac Catheterization 08/31/18 LM irregs LAD mild diffuse disease LCx prox stent patent, mid stent patent with 10 ISR; OM2 stent patent with 25 ISR RCA prox 40; RPDA ost stent patent with 40 ISR;  RPAVB 50 1. Patent stents in the left circumflex with mild restenosis 2. Patent stents in the RCA with mild in stent restenosis 3. Widely patent left main and LAD 4. Mildly elevated LVEDP Stable coronary anatomy without high-grade coronary stenosis. Medical therapy for CAD. OK to proceed with GI evaluation as needed.   Echo 08/28/18 EF 60-65, no RWMA, mild dilation of ascending aorta (40 mm), trivial MR, mod LAE, mild RAE, trivial TR, PASP 32  Nuclear stress test 08/28/18 Normal perfusion images. LVEF 64% with normal wall motion. However, poor exercise tolerance with chest pain, pallor and 3 mm lateral ST segment depression at exercise. Clinical findings suggestive of ischemia. Clinical correlation is strongly advised.  Past Medical History:  Diagnosis Date  . Allergy   . Arthritis   . CAD (coronary artery disease)    s/p bifurcation stenting (DES x2) and stenting bifurcation lesion distal RCA (1 DES)  . Cataract   . Depression   . Diabetes mellitus   . GERD (gastroesophageal reflux disease)   . Hyperlipemia   . Hypertension   . Myocardial infarction ([place])   . Sleep apnea    CPAP   Surgical Hx: The patient  has a past surgical history that includes Tonsillectomy (1959); Coronary angioplasty with stent ( March 2010); Cataract extraction (Bilateral); LEFT HEART CATH AND CORONARY ANGIOGRAPHY (N/A, 08/31/2018); Esophagogastroduodenoscopy (egd) with propofol (N/A, 09/01/2018); and biopsy (09/01/2018).   Current Medications:  Current Meds  Medication Sig  . amoxicillin-clavulanate (AUGMENTIN) 875-125 MG tablet Take 1 tablet by mouth 2 (two) times daily.  [name] aspirin 81 MG tablet Take 81 mg by mouth daily.    . folic acid (FOLVITE) 1 MG tablet Take 1 tablet (1 mg total) by mouth daily.  [name] glipiZIDE (GLUCOTROL) 5 MG tablet Take 1 tablet (5 mg total) by mouth 2 (two) times daily before a meal.  . glucose blood (ONE TOUCH ULTRA TEST) test strip Use to test blood sugar 2 times daily as  instructed. Dx: E11.59, E11.65  . HYDROcodone-homatropine (HYCODAN) 5-1.5 MG/5ML syrup Take 5 mLs by mouth at bedtime as needed for cough.  [name] LANTUS SOLOSTAR 100 UNIT/ML Solostar Pen INJECT 34 UNITS SUBCUTANEOUSLY ONCE DAILY AT  10PM  . losartan (COZAAR) 50 MG tablet Take 1.5 tablets (75 mg total) by mouth daily.  . metFORMIN (GLUCOPHAGE) 1000 MG tablet Take 1 tablet (1,000 mg total) by mouth 2 (two) times daily with a meal.  . Multiple Vitamin (MULTIVITAMIN WITH MINERALS) TABS tablet Take 1 tablet by mouth at bedtime.  . nitroGLYCERIN (NITROSTAT) 0.4 MG SL tablet Place 1 tablet (0.4 mg total) under the tongue every 5 (five) minutes as needed for chest pain.  Glory Rosebush DELICA LANCETS FINE MISC Use to test blood sugar 2 times daily as instructed. Dx: E11.59, E11.65  . pantoprazole (PROTONIX) 40 MG tablet Take 1 tablet (40 mg total) by mouth 2 (two) times daily.  [name] PARoxetine (PAXIL) 20 MG tablet TAKE 2 TABLETS BY MOUTH ONCE DAILY FOR DEPRESSION  . predniSONE (DELTASONE) 20 MG tablet Take 2 tablets (40 mg total) by mouth daily.  . propranolol (INDERAL) 10 MG tablet Take 1 tablet (10 mg total) by mouth 3 (three) times daily.  . rosuvastatin (CRESTOR) 10 MG tablet Take 1 tablet (10 mg total) by mouth daily.  . sildenafil (REVATIO) 20 MG tablet Take 60-100 mg by mouth daily as needed (for ED).   [name] thiamine 100 MG tablet Take 1 tablet (100 mg total) by mouth daily.  [name] triamcinolone cream (KENALOG) 0.1 % Apply 1 application topically 2 (two) times daily as needed (dry scalp).  . [DISCONTINUED] Insulin Glargine (LANTUS SOLOSTAR) 100 UNIT/ML Solostar Pen INJECT 30 UNITS SUBCUTANEOUSLY ONCE DAILY AT  10PM (Patient taking differently: Inject 34 Units into the skin at bedtime. )     Allergies:   Venlafaxine   Social History   Tobacco Use  . Smoking status: Former Smoker    Types: Cigarettes    Last attempt to quit: 11/30/1983    Years since quitting: 34.8  . Smokeless tobacco: Never Used  Substance  Use Topics  . Alcohol use: Yes    Alcohol/week: 20.0 standard drinks    Types: 20 Standard drinks or equivalent per week    Comment: beer  . Drug use: No     Family Hx: The patient's family history includes Alcohol abuse in his mother; Cancer in his brother and father; Diabetes in his father; Heart failure in his father and mother; Schizophrenia in his son; Uveitis in his son. There is no history of Colon cancer, Esophageal cancer, Stomach cancer, Liver cancer, Pancreatic cancer, or Rectal cancer.  ROS:   Please see the history of present illness.    Review of Systems  Genitourinary: Positive for incomplete emptying.   All other systems reviewed and are negative.   EKGs/Labs/Other Test Reviewed:    EKG:  EKG is not ordered today.    Recent Labs:  08/31/2018: ALT 37; BUN 8; Creatinine, Ser 0.94; Potassium 4.0; Sodium 141 09/15/2018: Hemoglobin 9.6; Platelets 191.0   Recent Lipid Panel Lab Results  Component Value Date/Time   CHOL 129 09/13/2017 10:40 AM   TRIG 112.0 09/13/2017 10:40 AM   HDL 40.70 09/13/2017 10:40 AM   CHOLHDL 3 09/13/2017 10:40 AM   LDLCALC 66 09/13/2017 10:40 AM   LDLDIRECT 103.9 11/05/2013 12:49 PM    Physical Exam:    VS:  BP 118/70   Pulse 70   Ht 5\' 9"  (1.753 m)   Wt 182 lb 6.4 oz (82.7 kg)   SpO2 97%   BMI 26.94 kg/m     Wt Readings from Last 3 Encounters:  09/25/18 182 lb 6.4 oz (82.7 kg)  09/22/18 182 lb (82.6 kg)  09/15/18 185 lb (83.9 kg)     Physical Exam  Constitutional: He is oriented to person, place, and time. He appears well-developed and well-nourished. No distress.  HENT:  Head: Normocephalic and atraumatic.  Eyes: No scleral icterus.  Neck: No JVD present. No thyromegaly present.  Cardiovascular: Normal rate and regular rhythm.  No murmur heard. Pulmonary/Chest: Effort normal. He has no rales.  Abdominal: Soft.  Musculoskeletal: He exhibits no edema.  R groin without hematoma or bruit  Lymphadenopathy:    He has no  cervical adenopathy.  Neurological: He is alert and oriented to person, place, and time.  Skin: Skin is warm and dry.  Psychiatric: He has a normal mood and affect.    ASSESSMENT & PLAN:    Coronary artery disease involving native coronary artery of native heart without angina pectoris Hx of complex 2 vessel PCI in 2010.  Recent Cardiac Catheterization with patent stents in the LCx and patent stents in the RCA.  The LM and LAD were widely patent.  He was likely having symptoms related to demand ischemia in the setting of profound blood loss anemia.  He is feeling better now without anginal symptoms.  Continue ASA, beta-blocker, statin.   Essential hypertension The patient's blood pressure is controlled on his current regimen.  Continue current therapy.    Hyperlipidemia LDL goal <70 Continue statin Rx.  Alcoholic cirrhosis of liver without ascites ([place]) Continue follow up with GI.  He did have evidence of esophageal varices on EGD.  He is currently on Propranolol.  Gastric ulcer due to nonsteroidal antiinflammatory drug (NSAID) therapy   He is on proton pump inhibitor therapy.  He is tolerating ASA without further bleeding.   Dispo:  Return in about 6 months (around 03/27/2019) for Routine Follow Up, w/ Dr. Burt Knack.   Medication Adjustments/Labs and Tests Ordered: Current medicines are reviewed at length with the patient today.  Concerns regarding medicines are outlined above.  Tests Ordered: No orders of the defined types were placed in this encounter.  Medication Changes: No orders of the defined types were placed in this encounter.   Signed, Richardson Dopp, PA-C  09/25/2018 12:59 PM    [facility] [address], [place], [place]  [address] Phone: [phone]; Fax: [phone]

## 2018-09-25 ENCOUNTER — Encounter: Payer: Self-pay | Admitting: Physician Assistant

## 2018-09-25 ENCOUNTER — Ambulatory Visit: Payer: Medicare Other | Admitting: Physician Assistant

## 2018-09-25 VITALS — BP 118/70 | HR 70 | Ht 69.0 in | Wt 182.4 lb

## 2018-09-25 DIAGNOSIS — K259 Gastric ulcer, unspecified as acute or chronic, without hemorrhage or perforation: Secondary | ICD-10-CM

## 2018-09-25 DIAGNOSIS — E785 Hyperlipidemia, unspecified: Secondary | ICD-10-CM

## 2018-09-25 DIAGNOSIS — I2510 Atherosclerotic heart disease of native coronary artery without angina pectoris: Secondary | ICD-10-CM | POA: Diagnosis not present

## 2018-09-25 DIAGNOSIS — I10 Essential (primary) hypertension: Secondary | ICD-10-CM

## 2018-09-25 DIAGNOSIS — T39395A Adverse effect of other nonsteroidal anti-inflammatory drugs [NSAID], initial encounter: Secondary | ICD-10-CM

## 2018-09-25 DIAGNOSIS — K7030 Alcoholic cirrhosis of liver without ascites: Secondary | ICD-10-CM

## 2018-09-25 MED ORDER — PREDNISONE 20 MG PO TABS: 40.0000 mg | ORAL_TABLET | Freq: Every day | ORAL | 0 refills | Status: DC

## 2018-09-25 NOTE — Patient Instructions (Signed)
Medication Instructions:   Your physician recommends that you continue on your current medications as directed. Please refer to the Current Medication list given to you today.   If you need a refill on your cardiac medications before your next appointment, please call your pharmacy.   Lab work: NONE ORDERED  TODAY    If you have labs (blood work) drawn today and your tests are completely normal, you will receive your results only by: . MyChart Message (if you have MyChart) OR . A paper copy in the mail If you have any lab test that is abnormal or we need to change your treatment, we will call you to review the results.  Testing/Procedures: NONE ORDERED  TODAY     Follow-Up: At [facility], you and your health needs are our priority.  As part of our continuing mission to provide you with exceptional heart care, we have created designated Provider Care Teams.  These Care Teams include your primary Cardiologist (physician) and Advanced Practice Providers (APPs -  Physician Assistants and Nurse Practitioners) who all work together to provide you with the care you need, when you need it. You will need a follow up appointment in:  6 months.  Please call our office 2 months in advance to schedule this appointment.  You may see Michael Cooper, MD or one of the following Advanced Practice Providers on your designated Care Team: Scott Weaver, PA-C Vin Bhagat, PA-C . Janine Hammond, NP  Any Other Special Instructions Will Be Listed Below (If Applicable).    

## 2018-09-26 ENCOUNTER — Ambulatory Visit: Payer: Self-pay | Admitting: Physician Assistant

## 2018-10-02 ENCOUNTER — Ambulatory Visit (INDEPENDENT_AMBULATORY_CARE_PROVIDER_SITE_OTHER): Payer: Medicare Other | Admitting: Internal Medicine

## 2018-10-02 ENCOUNTER — Encounter: Payer: Self-pay | Admitting: Internal Medicine

## 2018-10-02 VITALS — BP 130/80 | HR 68 | Ht 69.0 in | Wt 181.0 lb

## 2018-10-02 DIAGNOSIS — E1165 Type 2 diabetes mellitus with hyperglycemia: Secondary | ICD-10-CM | POA: Diagnosis not present

## 2018-10-02 DIAGNOSIS — E1159 Type 2 diabetes mellitus with other circulatory complications: Secondary | ICD-10-CM | POA: Diagnosis not present

## 2018-10-02 DIAGNOSIS — E785 Hyperlipidemia, unspecified: Secondary | ICD-10-CM

## 2018-10-02 DIAGNOSIS — E663 Overweight: Secondary | ICD-10-CM

## 2018-10-02 MED ORDER — GLIPIZIDE 5 MG PO TABS: 5.0000 mg | ORAL_TABLET | Freq: Two times a day (BID) | ORAL | 3 refills | Status: DC

## 2018-10-02 MED ORDER — INSULIN GLARGINE 100 UNIT/ML SOLOSTAR PEN: PEN_INJECTOR | SUBCUTANEOUS | 5 refills | Status: DC

## 2018-10-02 NOTE — Progress Notes (Signed)
Patient ID: David [name], male   DOB: 1951/09/07, 67 y.o.   MRN: [id number]   HPI: David [name] is a 67 y.o.-year-old male, returning for f/u for DM2, dx in ~2008, insulin-dependent since 09/2015, uncontrolled, with complications (CAD - s/p stents in 2013, PN). Last visit 4 months ago  He had a cardiac cath 08/2018 for presyncope: Stable coronary anatomy without high-grade coronary stenosis. Medical therapy for CAD.   He was found to be anemic (Hb 8.1) >> EGD:  - Non-bleeding grade II esophageal varices. - Non-bleeding gastric ulcers with a clean ulcer base ([place] Class III). Biopsied. - Erythematous duodenopathy. - Normal second portion of the duodenum.  He was started on Fe and changed PPI to Protonix.  Latest Hb 9.6 2 weeks ago.  Last hemoglobin A1c was: Lab Results  Component Value Date   HGBA1C 7.0 (H) 08/28/2018   HGBA1C 5.8 (A) 06/12/2018   HGBA1C 6.8 03/13/2018  03/05/2016: HbA1c 9.1%  Pt is on a regimen of: - Metformin 1000 mg 2x a day, with meals - Glipizide 10 >> 5  mg 2x a day, before meals - Lantus 36 >> 30 >> 34 units after dinner We tried Ozempic 0.5 mg weekly - rec'd 02/2018 >> not covered He was on Bydureon 2 mg weekly - started 01/30/2017 >> stopped 04/2017 b/c price - could not restart He had to stop Bydureon in 2017 b/c in donut hole.  Pt checks his sugars 2-3 times a day: - am:  88-191, 234 >> 45-178, 225 >> 80-185, 200-291 if skips Lantus - 2h after b'fast: n/c  - before lunch:141-233 >> 48-197 >> 118-210 - 2h after lunch: n/c - before dinner: 136-215 >> 46-133, 227, 256 >> 110-253 - 2h after dinner: n/c - bedtime:  64, 114-198, 125-276 (corrected lows) >> 126-209, 225-240 (candy), 261 (no Lantus) - nighttime: n/c Lowest sugar was 88 >> 45 >> 80; it is unclear at which level he has hypoglycemia awareness Highest sugar was 234 >> 276 >> 291  Glucometer: One Touch  Pt's meals are: - Breakfast: protein shake - Lunch: n/a - Dinner: meat + veggies  /potatoes or salad - Snacks: no Diet sodas, ice tea with splenda.   -No CKD, last BUN/creatinine:  Lab Results  Component Value Date   BUN 8 08/31/2018   CREATININE 0.94 08/31/2018  On losartan. -+ HL; last set of lipids: Lab Results  Component Value Date   CHOL 129 09/13/2017   HDL 40.70 09/13/2017   LDLCALC 66 09/13/2017   LDLDIRECT 103.9 11/05/2013   TRIG 112.0 09/13/2017   CHOLHDL 3 09/13/2017  On Crestor. - last eye exam was in 04/2017: No DR. Had cataract sx last Fall. -+ Numbness and tingling in his feet  He also has HTN, depression. He has a history of anabolic steroid use for muscle building.  ROS: Constitutional: no weight gain/no weight loss, no fatigue, no subjective hyperthermia, no subjective hypothermia Eyes: no blurry vision, no xerophthalmia ENT: no sore throat, no nodules palpated in neck, no dysphagia, no odynophagia, no hoarseness Cardiovascular: no CP/+ SOB/no palpitations/no leg swelling Respiratory: + cough/+ SOB/no wheezing Gastrointestinal: no N/no V/no D/no C/no acid reflux Musculoskeletal: no muscle aches/no joint aches Skin: no rashes, no hair loss Neurological: no tremors/+ numbness/+ tingling/no dizziness  I reviewed pt's medications, allergies, PMH, social hx, family hx, and changes were documented in the history of present illness. Otherwise, unchanged from my initial visit note.   Past Medical History:  Diagnosis Date  . Allergy   .  Arthritis   . CAD (coronary artery disease)    s/p bifurcation stenting (DES x2) and stenting bifurcation lesion distal RCA (1 DES)  . Cataract   . Depression   . Diabetes mellitus   . GERD (gastroesophageal reflux disease)   . Hyperlipemia   . Hypertension   . Myocardial infarction ([place])   . Sleep apnea    CPAP   Past Surgical History:  Procedure Laterality Date  . BIOPSY  09/01/2018   Procedure: BIOPSY;  Surgeon: Mauri Pole, MD;  Location: [facility];  Service: Endoscopy;;  . CATARACT  EXTRACTION Bilateral   . CORONARY ANGIOPLASTY WITH STENT PLACEMENT   March 2010   Dr. Olevia Perches  . ESOPHAGOGASTRODUODENOSCOPY (EGD) WITH PROPOFOL N/A 09/01/2018   Procedure: ESOPHAGOGASTRODUODENOSCOPY (EGD) WITH PROPOFOL;  Surgeon: Mauri Pole, MD;  Location: [facility];  Service: Endoscopy;  Laterality: N/A;  . LEFT HEART CATH AND CORONARY ANGIOGRAPHY N/A 08/31/2018   Procedure: LEFT HEART CATH AND CORONARY ANGIOGRAPHY;  Surgeon: Sherren Mocha, MD;  Location: [facility];  Service: Cardiovascular;  Laterality: N/A;  . TONSILLECTOMY  1959   Social History   Social History  . Marital Status: Married    Spouse Name: N/A  . Number of Children: 2   Occupational History  . 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 Smoking status: Former Smoker    Types: Cigarettes    Quit date: 11/30/1983  . Smokeless tobacco: Never Used  . Alcohol Use:  beer, 3-4 times a week, 6-8 drinks at the time       . Drug Use: No   Social History Narrative   No living will   Would want wife, then son David [name], as health care POA   Would accept resuscitation   Not sure about tube feeds   Current Outpatient Medications on File Prior to Visit  Medication Sig Dispense Refill  . amoxicillin-clavulanate (AUGMENTIN) 875-125 MG tablet Take 1 tablet by mouth 2 (two) times daily. 14 tablet 0  . aspirin 81 MG tablet Take 81 mg by mouth daily.      . folic acid (FOLVITE) 1 MG tablet Take 1 tablet (1 mg total) by mouth daily. 30 tablet 0  . glipiZIDE (GLUCOTROL) 5 MG tablet Take 1 tablet (5 mg total) by mouth 2 (two) times daily before a meal. 180 tablet 3  . glucose blood (ONE TOUCH ULTRA TEST) test strip Use to test blood sugar 2 times daily as instructed. Dx: E11.59, E11.65 100 each 5  . HYDROcodone-homatropine (HYCODAN) 5-1.5 MG/5ML syrup Take 5 mLs by mouth at bedtime as needed for cough. 120 mL 0  . LANTUS SOLOSTAR 100 UNIT/ML Solostar Pen  INJECT 34 UNITS SUBCUTANEOUSLY ONCE DAILY AT  10PM 15 pen 5  . losartan (COZAAR) 50 MG tablet Take 1.5 tablets (75 mg total) by mouth daily. 135 tablet 3  . metFORMIN (GLUCOPHAGE) 1000 MG tablet Take 1 tablet (1,000 mg total) by mouth 2 (two) times daily with a meal. 180 tablet 3  . Multiple Vitamin (MULTIVITAMIN WITH MINERALS) TABS tablet Take 1 tablet by mouth at bedtime.    . nitroGLYCERIN (NITROSTAT) 0.4 MG SL tablet Place 1 tablet (0.4 mg total) under the tongue every 5 (five) minutes as needed for chest pain.    Glory Rosebush DELICA LANCETS FINE MISC Use to test blood sugar 2 times daily as instructed. Dx: E11.59, E11.65 100 each  5  . pantoprazole (PROTONIX) 40 MG tablet Take 1 tablet (40 mg total) by mouth 2 (two) times daily. 60 tablet 1  . PARoxetine (PAXIL) 20 MG tablet TAKE 2 TABLETS BY MOUTH ONCE DAILY FOR DEPRESSION 180 tablet 3  . predniSONE (DELTASONE) 20 MG tablet Take 2 tablets (40 mg total) by mouth daily. 10 tablet 0  . propranolol (INDERAL) 10 MG tablet Take 1 tablet (10 mg total) by mouth 3 (three) times daily. 90 tablet 5  . rosuvastatin (CRESTOR) 10 MG tablet Take 1 tablet (10 mg total) by mouth daily. 90 tablet 2  . sildenafil (REVATIO) 20 MG tablet Take 60-100 mg by mouth daily as needed (for ED).     [name] thiamine 100 MG tablet Take 1 tablet (100 mg total) by mouth daily. 30 tablet 0  . triamcinolone cream (KENALOG) 0.1 % Apply 1 application topically 2 (two) times daily as needed (dry scalp).     No current facility-administered medications on file prior to visit.    Allergies  Allergen Reactions  . Venlafaxine     Slowed his urine stream   Family History  Problem Relation Age of Onset  . Heart failure Father        Deceased 43 y/o  . Diabetes Father   . Cancer Father        Prostate  . Heart failure Mother        Deceased 61 y/o  . Alcohol abuse Mother   . Schizophrenia Son        Paranoid  . Uveitis Son   . Cancer Brother        prostate  . Colon cancer Neg  Hx   . Esophageal cancer Neg Hx   . Stomach cancer Neg Hx   . Liver cancer Neg Hx   . Pancreatic cancer Neg Hx   . Rectal cancer Neg Hx    PE: BP 130/80   Pulse 68   Ht 5\' 9"  (1.753 m) Comment: measured  Wt 181 lb (82.1 kg)   SpO2 99%   BMI 26.73 kg/m  Body mass index is 26.73 kg/m. Wt Readings from Last 3 Encounters:  10/02/18 181 lb (82.1 kg)  09/25/18 182 lb 6.4 oz (82.7 kg)  09/22/18 182 lb (82.6 kg)   Constitutional: overweight, in NAD Eyes: PERRLA, EOMI, no exophthalmos ENT: moist mucous membranes, no thyromegaly, no cervical lymphadenopathy Cardiovascular: RRR, No MRG Respiratory: CTA B Gastrointestinal: abdomen soft, NT, ND, BS+ Musculoskeletal: no deformities, strength intact in all 4 Skin: moist, warm, no rashes Neurological: no tremor with outstretched hands, DTR normal in all 4  ASSESSMENT: 1. DM2, insulin-dependent, uncontrolled, with complications - CAD - s/p 4 stents 2013 - seeing Dr Burt Knack - PN  Component     Latest Ref Rng 06/09/2016  C-Peptide     0.80-3.85 ng/mL 1.78  Glucose, Fasting     65 - 99 mg/dL 184 (H)  Glutamic Acid Decarb Ab     <5 IU/mL <5  Pancreatic Islet Cell Antibody     < 5 JDF Units <5  Slight insulin deficiency, but no clear type 1 diabetes.  2. HL  3. Overweight  PLAN:  1. Patient with longstanding, uncontrolled, type 2 diabetes, with increased variability of his blood sugars.  He is on oral antidiabetic regimen and long-acting insulin.  We stopped GLP-1 receptor agonist due to insurance coverage.  At last visit I advised him to check with his insurance whether they are covering a Vgo pump. -  At last visit, we decreased both Lantus and glipizide as he was having lows (he had a glucose of 45 in the office at last visit after taking glipizide in the morning without eating). - However, 1 month ago, his HbA1c increased to 7% - I suggested to:  Patient Instructions  Please continue: - Metformin 1000 mg 2x a day, with meals -  Glipizide 5 mg 2x a day before meals - Lantus 30 units at bedtime  Please return in 3-4 months with your sugar log.   - continue checking sugars at different times of the day - check 1x a day, rotating checks - advised for yearly eye exams >> he is not UTD - Return to clinic in 3-4 mo with sugar log        2. HL - Reviewed latest lipid panel from 08/2017: All fractions at goal, with LDL greatly decreased. Lab Results  Component Value Date   CHOL 129 09/13/2017   HDL 40.70 09/13/2017   LDLCALC 66 09/13/2017   LDLDIRECT 103.9 11/05/2013   TRIG 112.0 09/13/2017   CHOLHDL 3 09/13/2017  - Continues Crestor without side effects.  3. Overweight -No significant weight loss since last visit -I hope we can decrease glipizide and Lantus in the near future, which should also help with weight loss  Philemon Kingdom, MD PhD [facility] Endocr[facility]ology

## 2018-10-02 NOTE — Patient Instructions (Addendum)
Please continue: - Metformin 1000 mg 2x a day, with meals - Glipizide 5 mg 2x a day before meals  Please increase: - Lantus to 38 units and move it to dinnertime  Please return in 3-4 months with your sugar log.

## 2018-10-13 ENCOUNTER — Inpatient Hospital Stay: Payer: Self-pay | Admitting: Internal Medicine

## 2018-10-17 DIAGNOSIS — E119 Type 2 diabetes mellitus without complications: Secondary | ICD-10-CM | POA: Diagnosis not present

## 2018-10-17 DIAGNOSIS — Z9842 Cataract extraction status, left eye: Secondary | ICD-10-CM | POA: Diagnosis not present

## 2018-10-17 DIAGNOSIS — H40053 Ocular hypertension, bilateral: Secondary | ICD-10-CM | POA: Diagnosis not present

## 2018-10-17 DIAGNOSIS — Z9841 Cataract extraction status, right eye: Secondary | ICD-10-CM | POA: Diagnosis not present

## 2018-10-17 LAB — HM DIABETES EYE EXAM

## 2018-10-19 ENCOUNTER — Encounter: Payer: Self-pay | Admitting: Internal Medicine

## 2018-11-06 ENCOUNTER — Other Ambulatory Visit: Payer: Self-pay | Admitting: Podiatry

## 2018-11-06 ENCOUNTER — Ambulatory Visit: Payer: Medicare Other | Admitting: Podiatry

## 2018-11-06 ENCOUNTER — Ambulatory Visit (INDEPENDENT_AMBULATORY_CARE_PROVIDER_SITE_OTHER): Payer: Medicare Other

## 2018-11-06 ENCOUNTER — Encounter: Payer: Self-pay | Admitting: Podiatry

## 2018-11-06 VITALS — BP 154/80 | HR 69 | Resp 16

## 2018-11-06 DIAGNOSIS — M79672 Pain in left foot: Secondary | ICD-10-CM | POA: Diagnosis not present

## 2018-11-06 DIAGNOSIS — M779 Enthesopathy, unspecified: Secondary | ICD-10-CM

## 2018-11-06 DIAGNOSIS — M79671 Pain in right foot: Secondary | ICD-10-CM

## 2018-11-06 DIAGNOSIS — M7752 Other enthesopathy of left foot: Secondary | ICD-10-CM | POA: Diagnosis not present

## 2018-11-06 DIAGNOSIS — M7751 Other enthesopathy of right foot: Secondary | ICD-10-CM

## 2018-11-06 MED ORDER — TRIAMCINOLONE ACETONIDE 10 MG/ML IJ SUSP
10.0000 mg | Freq: Once | INTRAMUSCULAR | Status: AC
  Administered 2018-11-06: 10 mg

## 2018-11-06 NOTE — Progress Notes (Signed)
   Subjective:    Patient ID: David [name], male    DOB: [birth year], 67 y.o.   MRN: [id number]  HPI    Review of Systems  All other systems reviewed and are negative.      Objective:   Physical Exam        Assessment & Plan:

## 2018-11-06 NOTE — Patient Instructions (Signed)
Tendinitis Tendinitis is inflammation of a tendon. A tendon is a strong cord of tissue that connects muscle to bone. Tendinitis can affect any tendon, but it most commonly affects the shoulder tendon (rotator cuff), ankle tendon (Achilles tendon), elbow tendon (triceps tendon), or one of the tendons in the wrist. What are the causes? This condition may be caused by:  Overusing a tendon or muscle. This is common.  Age-related wear and tear.  Injury.  Inflammatory conditions, such as arthritis.  Certain medicines.  What increases the risk? This condition is more likely to develop in people who do activities that involve repetitive motions. What are the signs or symptoms? Symptoms of this condition may include:  Pain.  Tenderness.  Mild swelling.  How is this diagnosed? This condition is diagnosed with a physical exam. You may also have tests, such as:  Ultrasound. This uses sound waves to make an image of your affected area.  MRI.  How is this treated? This condition may be treated by resting, icing, applying pressure (compression), and raising (elevating) the area above the level of your heart. This is known as RICE therapy. Treatment may also include:  Medicines to help reduce inflammation or to help reduce pain.  Exercises or physical therapy to strengthen and stretch the tendon.  A brace or splint.  Surgery (rare).  Follow these instructions at home:  If you have a splint or brace:  Wear the splint or brace as told by your health care provider. Remove it only as told by your health care provider.  Loosen the splint or brace if your fingers or toes tingle, become numb, or turn cold and blue.  Do not take baths, swim, or use a hot tub until your health care provider approves. Ask your health care provider if you can take showers. You may only be allowed to take sponge baths for bathing.  Do not let your splint or brace get wet if it is not waterproof. ? If your  splint or brace is not waterproof, cover it with a watertight plastic bag when you take a bath or a shower.  Keep the splint or brace clean. Managing pain, stiffness, and swelling  If directed, apply ice to the affected area. ? Put ice in a plastic bag. ? Place a towel between your skin and the bag. ? Leave the ice on for 20 minutes, 2-3 times a day.  If directed, apply heat to the affected area as often as told by your health care provider. Use the heat source that your health care provider recommends, such as a moist heat pack or a heating pad. ? Place a towel between your skin and the heat source. ? Leave the heat on for 20-30 minutes. ? Remove the heat if your skin turns bright red. This is especially important if you are unable to feel pain, heat, or cold. You may have a greater risk of getting burned.  Move the fingers or toes of the affected limb often, if this applies. This can help to prevent stiffness and lessen swelling.  If directed, elevate the affected area above the level of your heart while you are sitting or lying down. Driving  Do not drive or operate heavy machinery while taking prescription pain medicine.  Ask your health care provider when it is safe to drive if you have a splint or brace on any part of your arm or leg. Activity  Return to your normal activities as told by your health care   provider. Ask your health care provider what activities are safe for you.  Rest the affected area as told by your health care provider.  Avoid using the affected area while you are experiencing symptoms of tendinitis.  Do exercises as told by your health care provider. General instructions  If you have a splint, do not put pressure on any part of the splint until it is fully hardened. This may take several hours.  Wear an elastic bandage or compression wrap only as told by your health care provider.  Take over-the-counter and prescription medicines only as told by your  health care provider.  Keep all follow-up visits as told by your health care provider. This is important. Contact a health care provider if:  Your symptoms do not improve.  You develop new, unexplained problems, such as numbness in your hands. This information is not intended to replace advice given to you by your health care provider. Make sure you discuss any questions you have with your health care provider. Document Released: 11/12/2000 Document Revised: 07/15/2016 Document Reviewed: 08/18/2015 Elsevier Interactive Patient Education  2018 [employer]  

## 2018-11-08 NOTE — Progress Notes (Signed)
Subjective:   Patient ID: David [name], male   DOB: 67 y.o.   MRN: [id number]   HPI Patient states that he has had pain on the lateral side of both feet for the last 6 months and he works on his feet all day on concrete and also has had some numbness in his toes and does have diabetes with his last A1c being 7.0.  Patient does not smoke and likes to be active   Review of Systems  All other systems reviewed and are negative.       Objective:  Physical Exam  Constitutional: He appears well-developed and well-nourished.  Cardiovascular: Intact distal pulses.  Pulmonary/Chest: Effort normal.  Musculoskeletal: Normal range of motion.  Neurological: He is alert.  Skin: Skin is warm.  Nursing note and vitals reviewed.   Neurovascular status intact muscle strength was found to be adequate with patient found to have discomfort on the lateral side of both feet with inflammation fluid noted around the peroneal complex.  Patient was found to have good digital perfusion is well oriented x3 with moderate depression of the arch     Assessment:  Appears to be more of a peroneal tendinitis bilateral with inflammation fluid with moderate depression of the arch noted bilateral which is complicating factor     Plan:  H&P x-rays reviewed.  Today I did inject the dorsal lateral complex bilateral 3 mg Kenalog 5 mg Xylocaine advised on physical therapy and dispense fascial brace bilateral lift up the lateral side of the foot along with supportive shoes.  Patient will be seen back again in the next several weeks  X-rays indicate that there is no indications of advanced arthritis or stress fracture or enlargement base fifth metatarsal bilateral

## 2018-11-14 MED ORDER — PANTOPRAZOLE SODIUM 40 MG PO TBEC: 40.0000 mg | DELAYED_RELEASE_TABLET | Freq: Two times a day (BID) | ORAL | 3 refills | Status: DC

## 2018-11-20 ENCOUNTER — Encounter: Payer: Self-pay | Admitting: Podiatry

## 2018-11-20 ENCOUNTER — Ambulatory Visit (INDEPENDENT_AMBULATORY_CARE_PROVIDER_SITE_OTHER): Payer: Medicare Other | Admitting: Podiatry

## 2018-11-20 DIAGNOSIS — M779 Enthesopathy, unspecified: Secondary | ICD-10-CM

## 2018-11-20 DIAGNOSIS — B351 Tinea unguium: Secondary | ICD-10-CM | POA: Diagnosis not present

## 2018-11-20 DIAGNOSIS — G629 Polyneuropathy, unspecified: Secondary | ICD-10-CM | POA: Diagnosis not present

## 2018-11-21 NOTE — Progress Notes (Signed)
Subjective:   Patient ID: David [name], male   DOB: 67 y.o.   MRN: [id number]   HPI Patient presents stating the outside of his feet are feeling quite a bit better with reduced discomfort upon palpation and states that he still gets some shooting nervelike pains and also has some discoloration of the nailbed and wants to discuss all 3 problems   ROS      Objective:  Physical Exam  Neurovascular status intact muscle strength is adequate with patient found to have significant reduction of discomfort in the peroneal tendon group bilateral with pain that is present only upon deep palpation and is noted to have moderate reduction of sharp dull vibratory bilateral with discoloration of the left hallux nail     Assessment:  Tendinitis which is improved quite well along with 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

## 2018-11-27 ENCOUNTER — Encounter: Payer: Self-pay | Admitting: Internal Medicine

## 2018-11-27 ENCOUNTER — Ambulatory Visit (INDEPENDENT_AMBULATORY_CARE_PROVIDER_SITE_OTHER): Payer: Medicare Other | Admitting: Internal Medicine

## 2018-11-27 VITALS — BP 140/86 | HR 69 | Temp 98.7°F | Ht 68.75 in | Wt 191.0 lb

## 2018-11-27 DIAGNOSIS — Z125 Encounter for screening for malignant neoplasm of prostate: Secondary | ICD-10-CM

## 2018-11-27 DIAGNOSIS — Z0000 Encounter for general adult medical examination without abnormal findings: Secondary | ICD-10-CM | POA: Diagnosis not present

## 2018-11-27 DIAGNOSIS — Z7189 Other specified counseling: Secondary | ICD-10-CM

## 2018-11-27 DIAGNOSIS — K7030 Alcoholic cirrhosis of liver without ascites: Secondary | ICD-10-CM | POA: Diagnosis not present

## 2018-11-27 DIAGNOSIS — F330 Major depressive disorder, recurrent, mild: Secondary | ICD-10-CM

## 2018-11-27 DIAGNOSIS — D696 Thrombocytopenia, unspecified: Secondary | ICD-10-CM

## 2018-11-27 DIAGNOSIS — E1142 Type 2 diabetes mellitus with diabetic polyneuropathy: Secondary | ICD-10-CM | POA: Diagnosis not present

## 2018-11-27 LAB — PSA, MEDICARE: PSA: 1.99 ng/ml (ref 0.10–4.00)

## 2018-11-27 LAB — LIPID PANEL
CHOL/HDL RATIO: 2
Cholesterol: 143 mg/dL (ref 0–200)
HDL: 60.9 mg/dL (ref >39.00)
LDL Cholesterol: 64 mg/dL (ref 0–99)
NonHDL: 81.96
Triglycerides: 91 mg/dL (ref 0.0–149.0)
VLDL: 18.2 mg/dL (ref 0.0–40.0)

## 2018-11-27 LAB — CBC
HCT: 28.9 % — ABNORMAL LOW (ref 39.0–52.0)
Hemoglobin: 9.4 g/dL — ABNORMAL LOW (ref 13.0–17.0)
MCHC: 32.4 g/dL (ref 30.0–36.0)
MCV: 91.9 fl (ref 78.0–100.0)
Platelets: 117 10*3/uL — ABNORMAL LOW (ref 150.0–400.0)
RBC: 3.15 Mil/uL — ABNORMAL LOW (ref 4.22–5.81)
RDW: 19.8 % — ABNORMAL HIGH (ref 11.5–15.5)
WBC: 5.5 10*3/uL (ref 4.0–10.5)

## 2018-11-27 LAB — HM DIABETES FOOT EXAM

## 2018-11-27 MED ORDER — SILDENAFIL CITRATE 20 MG PO TABS: 60.0000 mg | ORAL_TABLET | Freq: Every day | ORAL | 11 refills | Status: DC | PRN

## 2018-11-27 NOTE — Assessment & Plan Note (Signed)
Has cut back a lot on the beer---and hopes to stop soon No symptoms On the beta blocker for the varices

## 2018-11-27 NOTE — Progress Notes (Signed)
Subjective:    Patient ID: David [name], male    DOB: October 11, 1951, 67 y.o.   MRN: [id number]  HPI Here for Medicare wellness visit and follow up of chronic health conditions Reviewed form and advanced directives Reviewed other physicians Still drinks 1-2 daily---has cut back Vision okay Some hearing problems--thinking about hearing aides [place] when he had the anemia--mostly had to let himself down Chronic mood problems Independent with instrumental ADLs No sig memory issues  No GI bleeding that he can tell No N/V No dizziness Stools normal color Continues on propranolol and PPI No recent abnormal bruising or bleeding--seems to be better lately  Recent benign cardiac cath No chest pain Tries to walk regularly No SOB  Mood is about the same Goes back and forth Continues on the paroxetine  Continues to see Dr Cruzita Lederer Diabetes control has been fine Ongoing numbness in feet Keeps up with podiatrist No palpitations  Current Outpatient Medications on File Prior to Visit  Medication Sig Dispense Refill  . aspirin 81 MG tablet Take 81 mg by mouth daily.      [name] glipiZIDE (GLUCOTROL) 5 MG tablet Take 1 tablet (5 mg total) by mouth 2 (two) times daily before a meal. 180 tablet 3  . glucose blood (ONE TOUCH ULTRA TEST) test strip Use to test blood sugar 2 times daily as instructed. Dx: E11.59, E11.65 100 each 5  . Insulin Glargine (LANTUS SOLOSTAR) 100 UNIT/ML Solostar Pen INJECT 38 UNITS SUBCUTANEOUSLY ONCE DAILY AT  10PM 15 pen 5  . losartan (COZAAR) 50 MG tablet Take 1.5 tablets (75 mg total) by mouth daily. 135 tablet 3  . metFORMIN (GLUCOPHAGE) 1000 MG tablet Take 1 tablet (1,000 mg total) by mouth 2 (two) times daily with a meal. 180 tablet 3  . Multiple Vitamin (MULTIVITAMIN WITH MINERALS) TABS tablet Take 1 tablet by mouth at bedtime.    . nitroGLYCERIN (NITROSTAT) 0.4 MG SL tablet Place 1 tablet (0.4 mg total) under the tongue every 5 (five) minutes as needed for chest  pain.    Glory Rosebush DELICA LANCETS FINE MISC Use to test blood sugar 2 times daily as instructed. Dx: E11.59, E11.65 100 each 5  . pantoprazole (PROTONIX) 40 MG tablet Take 1 tablet (40 mg total) by mouth 2 (two) times daily. 180 tablet 3  . PARoxetine (PAXIL) 20 MG tablet TAKE 2 TABLETS BY MOUTH ONCE DAILY FOR DEPRESSION 180 tablet 3  . propranolol (INDERAL) 10 MG tablet Take 1 tablet (10 mg total) by mouth 3 (three) times daily. 90 tablet 5  . rosuvastatin (CRESTOR) 10 MG tablet Take 1 tablet (10 mg total) by mouth daily. 90 tablet 2  . sildenafil (REVATIO) 20 MG tablet Take 60-100 mg by mouth daily as needed (for ED).     [name] thiamine 100 MG tablet Take 1 tablet (100 mg total) by mouth daily. 30 tablet 0  . triamcinolone cream (KENALOG) 0.1 % Apply 1 application topically 2 (two) times daily as needed (dry scalp).     No current facility-administered medications on file prior to visit.     Allergies  Allergen Reactions  . Venlafaxine     Slowed his urine stream    Past Medical History:  Diagnosis Date  . Allergy   . Arthritis   . CAD (coronary artery disease)    s/p bifurcation stenting (DES x2) and stenting bifurcation lesion distal RCA (1 DES)  . Cataract   . Depression   . Diabetes mellitus   .  GERD (gastroesophageal reflux disease)   . Hyperlipemia   . Hypertension   . Myocardial infarction ([place])   . Sleep apnea    CPAP    Past Surgical History:  Procedure Laterality Date  . BIOPSY  09/01/2018   Procedure: BIOPSY;  Surgeon: Mauri Pole, MD;  Location: [facility];  Service: Endoscopy;;  . CATARACT EXTRACTION Bilateral   . CORONARY ANGIOPLASTY WITH STENT PLACEMENT   March 2010   Dr. Olevia Perches  . ESOPHAGOGASTRODUODENOSCOPY (EGD) WITH PROPOFOL N/A 09/01/2018   Procedure: ESOPHAGOGASTRODUODENOSCOPY (EGD) WITH PROPOFOL;  Surgeon: Mauri Pole, MD;  Location: [facility];  Service: Endoscopy;  Laterality: N/A;  . LEFT HEART CATH AND CORONARY ANGIOGRAPHY N/A  08/31/2018   Procedure: LEFT HEART CATH AND CORONARY ANGIOGRAPHY;  Surgeon: Sherren Mocha, MD;  Location: [facility] [facility];  Service: Cardiovascular;  Laterality: N/A;  . TONSILLECTOMY  1959    Family History  Problem Relation Age of Onset  . Heart failure Father        Deceased 27 y/o  . Diabetes Father   . Cancer Father        Prostate  . Heart failure Mother        Deceased 75 y/o  . Alcohol abuse Mother   . Schizophrenia Son        Paranoid  . Uveitis Son   . Cancer Brother        prostate  . Colon cancer Neg Hx   . Esophageal cancer Neg Hx   . Stomach cancer Neg Hx   . Liver cancer Neg Hx   . Pancreatic cancer Neg Hx   . Rectal cancer Neg Hx     Social History   Socioeconomic History  . Marital status: Married    Spouse name: Not on file  . Number of children: Not on file  . Years of education: Not on file  . Highest education level: Not on file  Occupational History  . Occupation: Patent examiner buildings    Comment: site closed  . Occupation: Engineering geologist    Comment: part time  Social Needs  . Financial resource strain: Not on file  . Food insecurity:    Worry: Not on file    Inability: Not on file  . Transportation needs:    Medical: Not on file    Non-medical: Not on file  Tobacco Use  . Smoking status: Former Smoker    Types: Cigarettes    Last attempt to quit: 11/30/1983    Years since quitting: 35.0  . Smokeless tobacco: Never Used  Substance and Sexual Activity  . Alcohol use: Yes    Alcohol/week: 20.0 standard drinks    Types: 20 Standard drinks or equivalent per week    Comment: beer  . Drug use: No  . Sexual activity: Not on file  Lifestyle  . Physical activity:    Days per week: Not on file    Minutes per session: Not on file  . Stress: Not on file  Relationships  . Social connections:    Talks on phone: Not on file    Gets together: Not on file    Attends religious service: Not on file    Active member of club or  organization: Not on file    Attends meetings of clubs or organizations: Not on file    Relationship status: Not on file  . Intimate partner violence:    Fear of current or ex partner: Not on file  Emotionally abused: Not on file    Physically abused: Not on file    Forced sexual activity: Not on file  Other Topics Concern  . Not on file  Social History Narrative   No living will   Would want wife, then son [name], as health care POA   Would accept resuscitation   Not sure about tube feeds   Review of Systems Ongoing ED---not as effective lately Appetite is okay--weight up some Sleeps well other than 3-4 per night nocturia.  No major daytime issues with voiding No suspicious skin lesions Keeps up with dentist Bowels are fine No heartburn or dysphagia Bites tongue at night at times---wears tooth guard    Objective:   Physical Exam  Constitutional: He is oriented to person, place, and time. He appears well-developed. No distress.  HENT:  Mouth/Throat: Oropharynx is clear and moist. No oropharyngeal exudate.  Neck: No thyromegaly present.  Cardiovascular: Normal rate, regular rhythm, normal heart sounds and intact distal pulses. Exam reveals no gallop.  No murmur heard. Respiratory: Effort normal and breath sounds normal. No respiratory distress. He has no wheezes. He has no rales.  GI: Soft. There is no abdominal tenderness.  Musculoskeletal:        General: No tenderness or edema.  Lymphadenopathy:    He has no cervical adenopathy.  Neurological: He is alert and oriented to person, place, and time.  President--- "Gelene Mink" [id number] Recall 3/3  Slight decreased sensation in feet  Skin:  No skin lesions A few small ecchymotic areas--abd, arms  Psychiatric: He has a normal mood and affect. His behavior is normal.           Assessment & Plan:

## 2018-11-27 NOTE — Assessment & Plan Note (Signed)
Due to cirrhosis Will recheck

## 2018-11-27 NOTE — Assessment & Plan Note (Signed)
Control is fine Mild tingling and numbness---no Rx for now

## 2018-11-27 NOTE — Assessment & Plan Note (Signed)
I have personally reviewed the Medicare Annual Wellness questionnaire and have noted 1. The patient's medical and social history 2. Their use of alcohol, tobacco or illicit drugs 3. Their current medications and supplements 4. The patient's functional ability including ADL's, fall risks, home safety risks and hearing or visual             impairment. 5. Diet and physical activities 6. Evidence for depression or mood disorders  The patients weight, height, BMI and visual acuity have been recorded in the chart I have made referrals, counseling and provided education to the patient based review of the above and I have provided the pt with a written personalized care plan for preventive services.  I have provided you with a copy of your personalized plan for preventive services. Please take the time to review along with your updated medication list.  Recent colonoscopy Yearly flu vaccine Discussed PSA--will check Tries to walk regularly

## 2018-11-27 NOTE — Progress Notes (Signed)
Hearing Screening   Method: Audiometry   125Hz 250Hz 500Hz 1000Hz 2000Hz 3000Hz 4000Hz 6000Hz 8000Hz  Right ear:   20 20 20  20    Left ear:   20 20 20  20    Vision Screening Comments: October 2019   

## 2018-11-27 NOTE — Assessment & Plan Note (Signed)
Partial remission still Will continue the medication

## 2018-11-27 NOTE — Assessment & Plan Note (Signed)
See social history 

## 2018-12-18 ENCOUNTER — Encounter: Payer: Self-pay | Admitting: Internal Medicine

## 2019-01-15 DIAGNOSIS — M180 Bilateral primary osteoarthritis of first carpometacarpal joints: Secondary | ICD-10-CM | POA: Diagnosis not present

## 2019-01-23 DIAGNOSIS — Z0120 Encounter for dental examination and cleaning without abnormal findings: Secondary | ICD-10-CM | POA: Diagnosis not present

## 2019-02-06 ENCOUNTER — Ambulatory Visit: Payer: Medicare Other | Admitting: Internal Medicine

## 2019-02-06 ENCOUNTER — Other Ambulatory Visit: Payer: Self-pay

## 2019-02-06 ENCOUNTER — Encounter: Payer: Self-pay | Admitting: Internal Medicine

## 2019-02-06 VITALS — BP 128/76 | HR 69 | Ht 68.75 in | Wt 187.0 lb

## 2019-02-06 DIAGNOSIS — E1165 Type 2 diabetes mellitus with hyperglycemia: Secondary | ICD-10-CM | POA: Diagnosis not present

## 2019-02-06 DIAGNOSIS — E663 Overweight: Secondary | ICD-10-CM

## 2019-02-06 DIAGNOSIS — E785 Hyperlipidemia, unspecified: Secondary | ICD-10-CM

## 2019-02-06 DIAGNOSIS — E1159 Type 2 diabetes mellitus with other circulatory complications: Secondary | ICD-10-CM

## 2019-02-06 LAB — POCT GLYCOSYLATED HEMOGLOBIN (HGB A1C): Hemoglobin A1C: 7.4 % — AB (ref 4.0–5.6)

## 2019-02-06 MED ORDER — SEMAGLUTIDE(0.25 OR 0.5MG/DOS) 2 MG/1.5ML ~~LOC~~ SOPN: 0.5000 mg | PEN_INJECTOR | SUBCUTANEOUS | 5 refills | Status: DC

## 2019-02-06 NOTE — Patient Instructions (Addendum)
Please continue: - Metformin 1000 mg 2x a day, with meals - Glipizide 5 mg 2x a day before meals - Lantus 30 units at bedtime  Please start Ozempic 0.25 mg weekly in a.m. (for example on Sunday morning) x 4 weeks, then increase to 0.5 mg weekly in a.m. if no nausea or hypoglycemia.  If you are able to start Ozempic, after 1 week stop Glipizide.  Let me know if you canno start Ozempic, as we may need mealtime insulin.  Please return in 3-4 months with your sugar log.

## 2019-02-06 NOTE — Addendum Note (Signed)
Addended by: Cardell Peach I on: 02/06/2019 03:46 PM   Modules accepted: Orders

## 2019-02-06 NOTE — Progress Notes (Signed)
Patient ID: David [name], male   DOB: 08-Mar-1951, 68 y.o.   MRN: [id number]   HPI: David [name] is a 68 y.o.-year-old male, returning for f/u for DM2, dx in ~2008, insulin-dependent since 09/2015, uncontrolled, with complications (CAD - s/p stents in 2013, PN). Last visit 4 months ago.  Last hemoglobin A1c was: Lab Results  Component Value Date   HGBA1C 7.0 (H) 08/28/2018   HGBA1C 5.8 (A) 06/12/2018   HGBA1C 6.8 03/13/2018  03/05/2016: HbA1c 9.1%  Pt is on a regimen of: - Metformin 1000 mg 2x a day, with meals - Glipizide 10 >> 5 mg 2x a day before meals - Lantus 34 >> 30 units at bedtime - ran out 2 days ago! We tried Ozempic 0.5 mg weekly - rec'd 02/2018 >> not covered He was on Bydureon 2 mg weekly - started 01/30/2017 >> stopped 04/2017 b/c price - could not restart He had to stop Bydureon in 2017 b/c in donut hole.  Pt checks his sugars 2-3 times a day: - am:   45-178, 225 >> 80-185, 200-291 if skips Lantus >> 92, 107-247, 280 - 2h after b'fast: n/c  - before lunch:141-233 >> 48-197 >> 118-210 >> 85-187, 201 - 2h after lunch: n/c - before dinner: 46-133, 227, 256 >> 110-253 >> 68, 119-265 - 2h after dinner: n/c - bedtime:  64, 114-198, 125-276 >> 126-209, 225-240 (candy), 261 >> 160-215 - nighttime: n/c Lowest sugar was 88 >> 45 >> 80 >> 68; it is unclear at which level he has hypoglycemia awareness. Highest sugar was 234 >> 276 >> 291 >> 280.  Glucometer: One Touch  Pt's meals are: - Breakfast: protein shake - Lunch: n/a - Dinner: meat + veggies /potatoes or salad - Snacks: no Diet sodas, ice tea with splenda.  Eats many sweets.  -No CKD, last BUN/creatinine:  Lab Results  Component Value Date   BUN 8 08/31/2018   CREATININE 0.94 08/31/2018  On losartan. -+ HL; last set of lipids: Lab Results  Component Value Date   CHOL 143 11/27/2018   HDL 60.90 11/27/2018   LDLCALC 64 11/27/2018   LDLDIRECT 103.9 11/05/2013   TRIG 91.0 11/27/2018   CHOLHDL 2 11/27/2018   On Crestor. - last eye exam was in 09/2018: No DR.  Has previous history of cataract surgery. - + Numbness and tingling in his feet  He also has HTN, depression. He has a history of anabolic steroid use for muscle building.  He had a cardiac cath 08/2018 for presyncope: Stable coronary anatomy without high-grade coronary stenosis. Medical therapy for CAD.   He was found to be anemic (Hb 8.1) >> EGD:  - Non-bleeding grade II esophageal varices. - Non-bleeding gastric ulcers with a clean ulcer base (Forrest Class III). Biopsied. - Erythematous duodenopathy. - Normal second portion of the duodenum.  ROS: Constitutional: no weight gain/no weight loss, no fatigue, no subjective hyperthermia, no subjective hypothermia Eyes: no blurry vision, no xerophthalmia ENT: no sore throat, no nodules palpated in neck, no dysphagia, no odynophagia, no hoarseness Cardiovascular: no CP/no SOB/no palpitations/no leg swelling Respiratory: no cough/no SOB/no wheezing Gastrointestinal: no N/no V/no D/no C/no acid reflux Musculoskeletal: no muscle aches/no joint aches Skin: no rashes, no hair loss Neurological: no tremors/+ numbness/+ tingling/no dizziness  I reviewed pt's medications, allergies, PMH, social hx, family hx, and changes were documented in the history of present illness. Otherwise, unchanged from my initial visit note.   Past Medical History:  Diagnosis Date  . Allergy   .  Arthritis   . CAD (coronary artery disease)    s/p bifurcation stenting (DES x2) and stenting bifurcation lesion distal RCA (1 DES)  . Cataract   . Depression   . Diabetes mellitus   . GERD (gastroesophageal reflux disease)   . Hyperlipemia   . Hypertension   . Myocardial infarction ([place])   . Sleep apnea    CPAP   Past Surgical History:  Procedure Laterality Date  . BIOPSY  09/01/2018   Procedure: BIOPSY;  Surgeon: David Pole, MD;  Location: [facility];  Service: Endoscopy;;  . CATARACT EXTRACTION  Bilateral   . CORONARY ANGIOPLASTY WITH STENT PLACEMENT   March 2010   Dr. Olevia [name]  . ESOPHAGOGASTRODUODENOSCOPY (EGD) WITH PROPOFOL N/A 09/01/2018   Procedure: ESOPHAGOGASTRODUODENOSCOPY (EGD) WITH PROPOFOL;  Surgeon: David Pole, MD;  Location: [facility];  Service: Endoscopy;  Laterality: N/A;  . LEFT HEART CATH AND CORONARY ANGIOGRAPHY N/A 08/31/2018   Procedure: LEFT HEART CATH AND CORONARY ANGIOGRAPHY;  Surgeon: David Mocha, MD;  Location: [facility];  Service: Cardiovascular;  Laterality: N/A;  . TONSILLECTOMY  1959   Social History   Social History  . Marital Status: Married    Spouse Name: N/A  . Number of Children: 2   Occupational History  . 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 Smoking status: Former Smoker    Types: Cigarettes    Quit date: 11/30/1983  . Smokeless tobacco: Never Used  . Alcohol Use:  beer, 3-4 times a week, 6-8 drinks at the time       . Drug Use: No   Social History Narrative   No living will   Would want wife, then son David [name], as health care POA   Would accept resuscitation   Not sure about tube feeds   Current Outpatient Medications on File Prior to Visit  Medication Sig Dispense Refill  . aspirin 81 MG tablet Take 81 mg by mouth daily.      [name] glipiZIDE (GLUCOTROL) 5 MG tablet Take 1 tablet (5 mg total) by mouth 2 (two) times daily before a meal. 180 tablet 3  . glucose blood (ONE TOUCH ULTRA TEST) test strip Use to test blood sugar 2 times daily as instructed. Dx: E11.59, E11.65 100 each 5  . Insulin Glargine (LANTUS SOLOSTAR) 100 UNIT/ML Solostar Pen INJECT 38 UNITS SUBCUTANEOUSLY ONCE DAILY AT  10PM 15 pen 5  . losartan (COZAAR) 50 MG tablet Take 1.5 tablets (75 mg total) by mouth daily. 135 tablet 3  . metFORMIN (GLUCOPHAGE) 1000 MG tablet Take 1 tablet (1,000 mg total) by mouth 2 (two) times daily with a meal. 180 tablet 3  . Multiple Vitamin (MULTIVITAMIN  WITH MINERALS) TABS tablet Take 1 tablet by mouth at bedtime.    David [name] DELICA LANCETS FINE MISC Use to test blood sugar 2 times daily as instructed. Dx: E11.59, E11.65 100 each 5  . pantoprazole (PROTONIX) 40 MG tablet Take 1 tablet (40 mg total) by mouth 2 (two) times daily. 180 tablet 3  . PARoxetine (PAXIL) 20 MG tablet TAKE 2 TABLETS BY MOUTH ONCE DAILY FOR DEPRESSION 180 tablet 3  . propranolol (INDERAL) 10 MG tablet Take 1 tablet (10 mg total) by mouth 3 (three) times daily. 90 tablet 5  . rosuvastatin (CRESTOR) 10 MG tablet Take 1 tablet (10 mg total) by mouth daily. 90 tablet 2  . sildenafil (  REVATIO) 20 MG tablet Take 3-5 tablets (60-100 mg total) by mouth daily as needed (for ED). 50 tablet 11  . thiamine 100 MG tablet Take 1 tablet (100 mg total) by mouth daily. 30 tablet 0  . triamcinolone cream (KENALOG) 0.1 % Apply 1 application topically 2 (two) times daily as needed (dry scalp).     No current facility-administered medications on file prior to visit.    Allergies  Allergen Reactions  . Venlafaxine     Slowed his urine stream   Family History  Problem Relation Age of Onset  . Heart failure Father        Deceased 27 y/o  . Diabetes Father   . Cancer Father        Prostate  . Heart failure Mother        Deceased 18 y/o  . Alcohol abuse Mother   . Schizophrenia Son        Paranoid  . Uveitis Son   . Cancer Brother        prostate  . Colon cancer Neg Hx   . Esophageal cancer Neg Hx   . Stomach cancer Neg Hx   . Liver cancer Neg Hx   . Pancreatic cancer Neg Hx   . Rectal cancer Neg Hx    PE: BP 128/76   Pulse 69   Ht 5' 8.75" (1.746 m)   Wt 187 lb (84.8 kg)   SpO2 97%   BMI 27.82 kg/m  Body mass index is 27.82 kg/m. Wt Readings from Last 3 Encounters:  02/06/19 187 lb (84.8 kg)  11/27/18 191 lb (86.6 kg)  10/02/18 181 lb (82.1 kg)   Constitutional: overweight, in NAD Eyes: PERRLA, EOMI, no exophthalmos ENT: moist mucous membranes, no thyromegaly,  no cervical lymphadenopathy Cardiovascular: RRR, No MRG Respiratory: CTA B Gastrointestinal: abdomen soft, NT, ND, BS+ Musculoskeletal: no deformities, strength intact in all 4 Skin: moist, warm, no rashes Neurological: no tremor with outstretched hands, DTR normal in all 4  ASSESSMENT: 1. DM2, insulin-dependent, uncontrolled, with complications - CAD - s/p 4 stents 2013 - seeing Dr Burt Knack - PN  Component     Latest Ref Rng 06/09/2016  C-Peptide     0.80-3.85 ng/mL 1.78  Glucose, Fasting     65 - 99 mg/dL 184 (H)  Glutamic Acid Decarb Ab     <5 IU/mL <5  Pancreatic Islet Cell Antibody     < 5 JDF Units <5  Slight insulin deficiency, but no clear type 1 diabetes.  2. HL  3. Overweight  PLAN:  1. Patient with longstanding, uncontrolled, type 2 diabetes, with increased variability of his blood sugars.  He is on oral antidiabetic regimen and long-acting insulin.  Will start GLP-1 receptor agonist due to insurance.  We had to stop glipizide due to low blood sugars.  At last visit, HbA1c was 7%, higher. -At this visit, sugars are still high, but quite variable, without consistent patterns.  He has mostly high blood sugars in the morning, better sugars before lunch, and again higher blood sugars before dinner.  Sugars after dinner are not very high, but he is not checking consistently at that time. -At this visit, we discussed again about the need to start the GLP-1 receptor agonist.  He does have cardiovascular disease so he would greatly benefit from this.  In the new year, I will again try to send a prescription for Ozempic to his pharmacy.  I also gave him the number for the  [facility] pharmacy as he may be able to obtain help through them.  If he is able to start Ozempic, will stop glipizide.  If not, we will probably need mealtime insulin. - I suggested to:  Patient Instructions  Please continue: - Metformin 1000 mg 2x a day, with meals - Glipizide 5 mg 2x a day before meals - Lantus 30  units at bedtime  Please start Ozempic 0.25 mg weekly in a.m. (for example on Sunday morning) x 4 weeks, then increase to 0.5 mg weekly in a.m. if no nausea or hypoglycemia.  If you are able to start Ozempic, after 1 week stop Glipizide.  Let me know if you canno start Ozempic, as we may need mealtime insulin.  Please return in 3-4 months with your sugar log.   - today, HbA1c is 7.4% (higher) - continue checking sugars at different times of the day - check 4x a day, rotating checks - advised for yearly eye exams >> he is UTD - Return to clinic in 3-4 mo with sugar log        2. HL - Reviewed latest lipid panel from 10/2018: All fractions at goal Lab Results  Component Value Date   CHOL 143 11/27/2018   HDL 60.90 11/27/2018   LDLCALC 64 11/27/2018   LDLDIRECT 103.9 11/05/2013   TRIG 91.0 11/27/2018   CHOLHDL 2 11/27/2018  - Continues Crestor without side effects.  3. Overweight -Lost 4 pounds recently -We will start a GLP-1 receptor agonist which should also help with weight loss.  Philemon Kingdom, MD PhD [facility] Endocrinology

## 2019-02-12 DIAGNOSIS — M180 Bilateral primary osteoarthritis of first carpometacarpal joints: Secondary | ICD-10-CM | POA: Diagnosis not present

## 2019-02-13 ENCOUNTER — Encounter: Payer: Self-pay | Admitting: Internal Medicine

## 2019-03-20 ENCOUNTER — Telehealth: Payer: Self-pay | Admitting: Physician Assistant

## 2019-03-20 NOTE — Telephone Encounter (Signed)
°  4/21 :  Left VM for patient to return call regarding visit on 4/27.

## 2019-03-20 NOTE — Telephone Encounter (Signed)
Follow up  ° ° °Patient is returning your call. °

## 2019-03-21 NOTE — Telephone Encounter (Signed)
° ° °  NEW MESSAGE  - 4/22 :   Spoke with patient.     MyChart App/VIDEO visit on 03/26/2019    Consent sent via MyChart   -   03/21/2019

## 2019-03-25 NOTE — Progress Notes (Deleted)
{Choose 1 Note Type (Telehealth Visit or Telephone Visit):[phone]}   Evaluation Performed:  Follow-up visit  Date:  03/25/2019   ID:  David [name], DOB 03-27-1951, MRN [id number]  {Patient Location:[phone]::"Home"} {Provider Location:[phone]}  PCP:  [name], MD  Cardiologist:  Sherren Mocha, MD *** Electrophysiologist:  None  GI:  Dr. Ardis Hughs  Chief Complaint:  ***  History of Present Illness:    David [name] is a 68 y.o. male with coronary artery disease s/p complex 2 vessel PCI in 2010, hypertension, diabetes, hyperlipidemia, alcohol abuse, hepatic cirrhosis with esophageal varices, prior UGI bleed (gastric ulcer 2/2 NSAID use) in 08/2018 requiring transfusion with PRBCs.  Cardiac Catheterization in 10/19 demonstrated patent stents in the LCx and RCA.  He was last seen in 08/2018.  ***  The patient {does/does not:200015} have symptoms concerning for COVID-19 infection (fever, chills, cough, or new shortness of breath).    Past Medical History:  Diagnosis Date  . Allergy   . Arthritis   . CAD (coronary artery disease)    s/p bifurcation stenting (DES x2) and stenting bifurcation lesion distal RCA (1 DES)  . Cataract   . Depression   . Diabetes mellitus   . GERD (gastroesophageal reflux disease)   . Hyperlipemia   . Hypertension   . Myocardial infarction ([place])   . Sleep apnea    CPAP   Past Surgical History:  Procedure Laterality Date  . BIOPSY  09/01/2018   Procedure: BIOPSY;  Surgeon: Mauri Pole, MD;  Location: [facility];  Service: Endoscopy;;  . CATARACT EXTRACTION Bilateral   . CORONARY ANGIOPLASTY WITH STENT PLACEMENT   March 2010   Dr. Olevia Perches  . ESOPHAGOGASTRODUODENOSCOPY (EGD) WITH PROPOFOL N/A 09/01/2018   Procedure: ESOPHAGOGASTRODUODENOSCOPY (EGD) WITH PROPOFOL;  Surgeon: Mauri Pole, MD;  Location: [facility];  Service: Endoscopy;  Laterality: N/A;  . LEFT HEART CATH AND CORONARY ANGIOGRAPHY N/A 08/31/2018   Procedure:  LEFT HEART CATH AND CORONARY ANGIOGRAPHY;  Surgeon: Sherren Mocha, MD;  Location: [place] [facility];  Service: Cardiovascular;  Laterality: N/A;  . TONSILLECTOMY  1959     No outpatient medications have been marked as taking for the 03/26/19 encounter (Appointment) with Richardson Dopp T, PA-C.     Allergies:   Venlafaxine   Social History   Tobacco Use  . Smoking status: Former Smoker    Types: Cigarettes    Last attempt to quit: 11/30/1983    Years since quitting: 35.3  . Smokeless tobacco: Never Used  Substance Use Topics  . Alcohol use: Yes    Alcohol/week: 20.0 standard drinks    Types: 20 Standard drinks or equivalent per week    Comment: beer  . Drug use: No     Family Hx: The patient's family history includes Alcohol abuse in his mother; Cancer in his brother and father; Diabetes in his father; Heart failure in his father and mother; Schizophrenia in his son; Uveitis in his son. There is no history of Colon cancer, Esophageal cancer, Stomach cancer, Liver cancer, Pancreatic cancer, or Rectal cancer.  ROS:   Please see the history of present illness.    *** All other systems reviewed and are negative.   Prior CV studies:   The following studies were reviewed today:  Cardiac Catheterization 08/31/18 LM irregs LAD mild diffuse disease LCx prox stent patent, mid stent patent with 10 ISR; OM2 stent patent with 25 ISR RCA prox 40; RPDA ost stent patent with 40 ISR; RPAVB 50 1. Patent  stents in the left circumflex with mild restenosis 2. Patent stents in the RCA with mild in stent restenosis 3. Widely patent left main and LAD 4. Mildly elevated LVEDP Stable coronary anatomy without high-grade coronary stenosis. Medical therapy for CAD. OK to proceed with GI evaluation as needed.    Echo 08/28/18 EF 60-65, no RWMA, mild dilation of ascending aorta (40 mm), trivial MR, mod LAE, mild RAE, trivial TR, PASP 32   Nuclear stress test 08/28/18 Normal perfusion images. LVEF 64%  with normal wall motion. However, poor exercise tolerance with chest pain, pallor and 3 mm lateral ST segment depression at exercise. Clinical findings suggestive of ischemia. Clinical correlation is strongly advised.   Labs/Other Tests and Data Reviewed:    EKG:  {EKG/Telemetry Strips Reviewed:[phone]}  Recent Labs: 08/31/2018: ALT 37; BUN 8; Creatinine, Ser 0.94; Potassium 4.0; Sodium 141 11/27/2018: Hemoglobin 9.4; Platelets 117.0   Recent Lipid Panel Lab Results  Component Value Date/Time   CHOL 143 11/27/2018 12:38 PM   TRIG 91.0 11/27/2018 12:38 PM   HDL 60.90 11/27/2018 12:38 PM   CHOLHDL 2 11/27/2018 12:38 PM   LDLCALC 64 11/27/2018 12:38 PM   LDLDIRECT 103.9 11/05/2013 12:49 PM    Wt Readings from Last 3 Encounters:  02/06/19 187 lb (84.8 kg)  11/27/18 191 lb (86.6 kg)  10/02/18 181 lb (82.1 kg)     Objective:    Vital Signs:  There were no vitals taken for this visit.   {[facility] Virtual Exam (Optional):[phone]::"VITAL SIGNS:  reviewed"}  ASSESSMENT & PLAN:    1. *** Coronary artery disease involving native coronary artery of native heart without angina pectoris Hx of complex 2 vessel PCI in 2010.  Recent Cardiac Catheterization with patent stents in the LCx and patent stents in the RCA.  The LM and LAD were widely patent.  He was likely having symptoms related to demand ischemia in the setting of profound blood loss anemia.  He is feeling better now without anginal symptoms.  Continue ASA, beta-blocker, statin.    Essential hypertension The patient's blood pressure is controlled on his current regimen.  Continue current therapy.     Hyperlipidemia LDL goal <70 Continue statin Rx.   Alcoholic cirrhosis of liver without ascites ([place]) Continue follow up with GI.  He did have evidence of esophageal varices on EGD.  He is currently on Propranolol.   Gastric ulcer due to nonsteroidal antiinflammatory drug (NSAID) therapy   He is on proton pump inhibitor  therapy.  He is tolerating ASA without further bleeding.  COVID-19 Education: The signs and symptoms of COVID-19 were discussed with the patient and how to seek care for testing (follow up with PCP or arrange E-visit).  ***The importance of social distancing was discussed today.  Time:   Today, I have spent *** minutes with the patient with telehealth technology discussing the above problems.     Medication Adjustments/Labs and Tests Ordered: Current medicines are reviewed at length with the patient today.  Concerns regarding medicines are outlined above.   Tests Ordered: No orders of the defined types were placed in this encounter.   Medication Changes: No orders of the defined types were placed in this encounter.   Disposition:  Follow up {follow up:15908}  Signed, Richardson Dopp, PA-C  03/25/2019 9:29 PM     [facility]

## 2019-03-26 ENCOUNTER — Ambulatory Visit: Payer: Self-pay | Admitting: Physician Assistant

## 2019-03-26 ENCOUNTER — Telehealth: Payer: Self-pay | Admitting: Physician Assistant

## 2019-03-26 NOTE — Telephone Encounter (Signed)
See message from patient. He wants to cancel today's visit. Please contact him to reschedule. I have not taken him off of the schedule yet for today just in case. Richardson Dopp, PA-C    03/26/2019 8:50 AM

## 2019-03-31 ENCOUNTER — Other Ambulatory Visit: Payer: Self-pay

## 2019-03-31 MED ORDER — TRIAMCINOLONE ACETONIDE 0.1 % EX CREA: 1.0000 "application " | TOPICAL_CREAM | Freq: Two times a day (BID) | CUTANEOUS | 0 refills | Status: DC | PRN

## 2019-03-31 NOTE — Telephone Encounter (Signed)
Rx sent electronically.  

## 2019-04-08 ENCOUNTER — Other Ambulatory Visit: Payer: Self-pay | Admitting: Gastroenterology

## 2019-04-09 NOTE — Telephone Encounter (Signed)
LM for pt that he needs an Virtual visit with Dr. Ardis Hughs (zoom)

## 2019-04-15 ENCOUNTER — Other Ambulatory Visit: Payer: Self-pay | Admitting: Gastroenterology

## 2019-04-15 ENCOUNTER — Encounter: Payer: Self-pay | Admitting: Internal Medicine

## 2019-05-07 ENCOUNTER — Other Ambulatory Visit: Payer: Self-pay

## 2019-05-07 ENCOUNTER — Encounter: Payer: Self-pay | Admitting: Podiatry

## 2019-05-07 ENCOUNTER — Other Ambulatory Visit: Payer: Self-pay | Admitting: Internal Medicine

## 2019-05-07 ENCOUNTER — Ambulatory Visit: Payer: Medicare Other | Admitting: Podiatry

## 2019-05-07 VITALS — Temp 98.2°F

## 2019-05-07 DIAGNOSIS — M779 Enthesopathy, unspecified: Secondary | ICD-10-CM | POA: Diagnosis not present

## 2019-05-07 MED ORDER — TRIAMCINOLONE ACETONIDE 10 MG/ML IJ SUSP
10.0000 mg | Freq: Once | INTRAMUSCULAR | Status: AC
  Administered 2019-05-07: 10 mg

## 2019-05-07 NOTE — Progress Notes (Signed)
Subjective:   Patient ID: David [name], male   DOB: 68 y.o.   MRN: [id number]   HPI Patient presents stating the top of my foot has started to hurt again and I was better for around 5 months   ROS      Objective:  Physical Exam  Neurovascular status intact with patient found to have quite a bit of discomfort on the dorsal lateral aspect of the right foot around the extensor complex with inflammation pain associated with it     Assessment:  Dorsal lateral tendinitis with inflammation fluid buildup     Plan:  Sterile prep and injected the dorsal tendon complex 3 mg Kenalog 5 mg Xylocaine which was tolerated well and reappoint for routine care as needed

## 2019-06-04 NOTE — Telephone Encounter (Signed)
Please see his original message at the top. I had asked him to describe the stool and if he had taken Pepto.

## 2019-06-07 ENCOUNTER — Other Ambulatory Visit: Payer: Self-pay

## 2019-06-11 ENCOUNTER — Encounter: Payer: Self-pay | Admitting: Internal Medicine

## 2019-06-11 ENCOUNTER — Ambulatory Visit: Payer: Medicare Other | Admitting: Internal Medicine

## 2019-06-11 ENCOUNTER — Other Ambulatory Visit: Payer: Self-pay

## 2019-06-11 VITALS — BP 118/70 | HR 98 | Ht 68.25 in | Wt 184.0 lb

## 2019-06-11 DIAGNOSIS — E1165 Type 2 diabetes mellitus with hyperglycemia: Secondary | ICD-10-CM

## 2019-06-11 DIAGNOSIS — E1159 Type 2 diabetes mellitus with other circulatory complications: Secondary | ICD-10-CM | POA: Diagnosis not present

## 2019-06-11 DIAGNOSIS — E663 Overweight: Secondary | ICD-10-CM | POA: Diagnosis not present

## 2019-06-11 DIAGNOSIS — E785 Hyperlipidemia, unspecified: Secondary | ICD-10-CM

## 2019-06-11 LAB — POCT GLYCOSYLATED HEMOGLOBIN (HGB A1C): Hemoglobin A1C: 9.9 % — AB (ref 4.0–5.6)

## 2019-06-11 MED ORDER — INSULIN PEN NEEDLE 32G X 4 MM MISC: 3 refills | Status: DC

## 2019-06-11 MED ORDER — HUMALOG KWIKPEN 200 UNIT/ML ~~LOC~~ SOPN: 8.0000 [IU] | PEN_INJECTOR | Freq: Three times a day (TID) | SUBCUTANEOUS | 5 refills | Status: DC

## 2019-06-11 NOTE — Patient Instructions (Addendum)
Please continue: - Metformin 1000 mg 2x a day, with meals - Lantus 40 units at bedtime - Ozempic 0.5 mg weekly  Please start: - Humalog 8-12 units 15 min before a meal  Please return in 3 months with your sugar log.

## 2019-06-11 NOTE — Addendum Note (Signed)
Addended by: Cardell Peach I on: 06/11/2019 10:33 AM   Modules accepted: Orders

## 2019-06-11 NOTE — Progress Notes (Signed)
Patient ID: David [name], male   DOB: 1951-11-27, 68 y.o.   MRN: [id number]   HPI: David [name] is a 68 y.o.-year-old male, returning for f/u for DM2, dx in ~2008, insulin-dependent since 09/2015, uncontrolled, with complications (CAD - s/p stents in 2013, PN). Last visit 4 months ago.  At this visit, sugars are much worse despite starting Ozempic at last visit.  He tells me that his diet is worse and he is more stressed after he moved out of his house.  Last hemoglobin A1c was: Lab Results  Component Value Date   HGBA1C 7.4 (A) 02/06/2019   HGBA1C 7.0 (H) 08/28/2018   HGBA1C 5.8 (A) 06/12/2018  03/05/2016: HbA1c 9.1%  Pt is on a regimen of: - Metformin 1000 mg 2x a day, with meals  - Lantus 30 >> 40 units at bedtime - Ozempic 0.5 mg weekly - started 01/2019 We tried Ozempic 0.5 mg weekly - rec'd 02/2018 >> not covered He was on Bydureon 2 mg weekly - started 01/30/2017 >> stopped 04/2017 b/c price - could not restart He had to stop Bydureon in 2017 b/c in donut hole.  Pt checks his sugars 2-3 times a day: - am:   80-185, 200-291 if skips Lantus >> 92, 107-247, 280 >> 157-322, 363 - 2h after b'fast: n/c  - before lunch:141-233 >> 48-197 >> 118-210 >> 85-187, 201 >> 158, 327 - 2h after lunch: n/c - before dinner: 46-133, 227, 256 >> 110-253 >> 68, 119-265 >> 200-360 - 2h after dinner: n/c - bedtime:  126-209, 225-240 (candy), 261 >> 160-215 >> n/c - nighttime: n/c Lowest sugar was 68 >> 157; it is unclear at which level he has hypoglycemia awareness. Highest sugar was 280 >> 363.  Glucometer: One Touch  Pt's meals are: - Breakfast: protein shake - Lunch: n/a - Dinner: meat + veggies /potatoes or salad - Snacks: no Diet sodas, ice tea with splenda.  Eats many sweets.  -No CKD, last BUN/creatinine:  Lab Results  Component Value Date   BUN 8 08/31/2018   CREATININE 0.94 08/31/2018  On losartan. -+ HL; last set of lipids: Lab Results  Component Value Date   CHOL 143  11/27/2018   HDL 60.90 11/27/2018   LDLCALC 64 11/27/2018   LDLDIRECT 103.9 11/05/2013   TRIG 91.0 11/27/2018   CHOLHDL 2 11/27/2018  On Crestor. - last eye exam was in 09/2018: No DR.  Has previous history of cataract surgery. - + Numbness and tingling in his feet  He also has HTN, depression.  He has a history of anabolic steroid use for muscle building.  He had a cardiac cath 08/2018 for presyncope: Stable coronary anatomy without high-grade coronary stenosis.  He is on medical therapy for CAD.  He was found to be anemic (Hb 8.1) >> EGD report reviewed:  - Non-bleeding grade II esophageal varices. - Non-bleeding gastric ulcers with a clean ulcer base (Forrest Class III). Biopsied. - Erythematous duodenopathy. - Normal second portion of the duodenum.  ROS: Constitutional: no weight gain/no weight loss, no fatigue, no subjective hyperthermia, no subjective hypothermia Eyes: no blurry vision, no xerophthalmia ENT: no sore throat, no nodules palpated in neck, no dysphagia, no odynophagia, no hoarseness Cardiovascular: no CP/no SOB/no palpitations/no leg swelling Respiratory: no cough/no SOB/no wheezing Gastrointestinal: no N/no V/no D/no C/no acid reflux Musculoskeletal: no muscle aches/no joint aches Skin: no rashes, no hair loss Neurological: no tremors/+ numbness/+ tingling/no dizziness  I reviewed pt's medications, allergies, PMH, social hx, family hx, and  changes were documented in the history of present illness. Otherwise, unchanged from my initial visit note.   Past Medical History:  Diagnosis Date  . Allergy   . Arthritis   . CAD (coronary artery disease)    s/p bifurcation stenting (DES x2) and stenting bifurcation lesion distal RCA (1 DES)  . Cataract   . Depression   . Diabetes mellitus   . GERD (gastroesophageal reflux disease)   . Hyperlipemia   . Hypertension   . Myocardial infarction ([place])   . Sleep apnea    CPAP   Past Surgical History:  Procedure  Laterality Date  . BIOPSY  09/01/2018   Procedure: BIOPSY;  Surgeon: Mauri Pole, MD;  Location: [place];  Service: Endoscopy;;  . CATARACT EXTRACTION Bilateral   . CORONARY ANGIOPLASTY WITH STENT PLACEMENT   March 2010   Dr. Olevia Perches  . ESOPHAGOGASTRODUODENOSCOPY (EGD) WITH PROPOFOL N/A 09/01/2018   Procedure: ESOPHAGOGASTRODUODENOSCOPY (EGD) WITH PROPOFOL;  Surgeon: Mauri Pole, MD;  Location: [facility];  Service: Endoscopy;  Laterality: N/A;  . LEFT HEART CATH AND CORONARY ANGIOGRAPHY N/A 08/31/2018   Procedure: LEFT HEART CATH AND CORONARY ANGIOGRAPHY;  Surgeon: Sherren Mocha, MD;  Location: [facility];  Service: Cardiovascular;  Laterality: N/A;  . TONSILLECTOMY  1959   Social History   Social History  . Marital Status: Married    Spouse Name: N/A  . Number of Children: 2   Occupational History  . 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 Smoking status: Former Smoker    Types: Cigarettes    Quit date: 11/30/1983  . Smokeless tobacco: Never Used  . Alcohol Use:  beer, 3-4 times a week, 6-8 drinks at the time       . Drug Use: No   Social History Narrative   No living will   Would want wife, then son David [name], as health care POA   Would accept resuscitation   Not sure about tube feeds   Current Outpatient Medications on File Prior to Visit  Medication Sig Dispense Refill  . glipiZIDE (GLUCOTROL) 5 MG tablet Take 1 tablet (5 mg total) by mouth 2 (two) times daily before a meal. 180 tablet 3  . glucose blood (ONE TOUCH ULTRA TEST) test strip Use to test blood sugar 2 times daily as instructed. Dx: E11.59, E11.65 100 each 5  . Insulin Glargine (LANTUS SOLOSTAR) 100 UNIT/ML Solostar Pen INJECT 38 UNITS SUBCUTANEOUSLY ONCE DAILY AT  10PM 15 pen 5  . losartan (COZAAR) 50 MG tablet Take 1.5 tablets (75 mg total) by mouth daily. 135 tablet 3  . metFORMIN (GLUCOPHAGE) 1000 MG tablet Take 1  tablet (1,000 mg total) by mouth 2 (two) times daily with a meal. 180 tablet 3  . Multiple Vitamin (MULTIVITAMIN WITH MINERALS) TABS tablet Take 1 tablet by mouth at bedtime.    David [name] DELICA LANCETS FINE MISC Use to test blood sugar 2 times daily as instructed. Dx: E11.59, E11.65 100 each 5  . pantoprazole (PROTONIX) 40 MG tablet Take 1 tablet (40 mg total) by mouth 2 (two) times daily. 180 tablet 3  . PARoxetine (PAXIL) 20 MG tablet TAKE 2 TABLETS BY MOUTH ONCE DAILY FOR DEPRESSION 180 tablet 3  . propranolol (INDERAL) 10 MG tablet Take 1 tablet (10 mg total) by mouth 3 (three) times daily. 90 tablet 5  . rosuvastatin (CRESTOR) 10 MG tablet Take  1 tablet by mouth once daily 90 tablet 0  . Semaglutide,0.25 or 0.5MG /DOS, (OZEMPIC, 0.25 OR 0.5 MG/DOSE,) 2 MG/1.5ML SOPN Inject 0.5 mg into the skin once a week. 2 pen 5  . sildenafil (REVATIO) 20 MG tablet Take 3-5 tablets (60-100 mg total) by mouth daily as needed (for ED). 50 tablet 11  . thiamine 100 MG tablet Take 1 tablet (100 mg total) by mouth daily. 30 tablet 0  . triamcinolone cream (KENALOG) 0.1 % Apply 1 application topically 2 (two) times daily as needed (dry scalp). 30 g 0   No current facility-administered medications on file prior to visit.    Allergies  Allergen Reactions  . Venlafaxine     Slowed his urine stream   Family History  Problem Relation Age of Onset  . Heart failure Father        Deceased 63 y/o  . Diabetes Father   . Cancer Father        Prostate  . Heart failure Mother        Deceased 15 y/o  . Alcohol abuse Mother   . Schizophrenia Son        Paranoid  . Uveitis Son   . Cancer Brother        prostate  . Colon cancer Neg Hx   . Esophageal cancer Neg Hx   . Stomach cancer Neg Hx   . Liver cancer Neg Hx   . Pancreatic cancer Neg Hx   . Rectal cancer Neg Hx    PE: BP 118/70   Pulse 98   Ht 5' 8.25" (1.734 m) Comment: measured without shoes  Wt 184 lb (83.5 kg)   BMI 27.77 kg/m  Body mass index  is 27.77 kg/m. Wt Readings from Last 3 Encounters:  06/11/19 184 lb (83.5 kg)  02/06/19 187 lb (84.8 kg)  11/27/18 191 lb (86.6 kg)   Constitutional: overweight, in NAD Eyes: PERRLA, EOMI, no exophthalmos ENT: moist mucous membranes, no thyromegaly, no cervical lymphadenopathy Cardiovascular: tachycardia, RR, No MRG Respiratory: CTA B Gastrointestinal: abdomen soft, NT, ND, BS+ Musculoskeletal: no deformities, strength intact in all 4 Skin: moist, warm, no rashes Neurological: no tremor with outstretched hands, DTR normal in all 4  ASSESSMENT: 1. DM2, insulin-dependent, uncontrolled, with complications - CAD - s/p 4 stents 2013 - seeing Dr Burt Knack - PN  Component     Latest Ref Rng 06/09/2016  C-Peptide     0.80-3.85 ng/mL 1.78  Glucose, Fasting     65 - 99 mg/dL 184 (H)  Glutamic Acid Decarb Ab     <5 IU/mL <5  Pancreatic Islet Cell Antibody     < 5 JDF Units <5  Slight insulin deficiency, but no clear type 1 diabetes.  2. HL  3. Overweight  PLAN:  1. Patient with longstanding, uncontrolled, type 2 diabetes, with increased variability of his blood sugars.  He is on oral antidiabetic regimen, long-acting insulin, and now weekly GLP-1 receptor agonist added at last visit.  At that time, sugars are very variable, without consistent patterns but mostly high blood sugars in the morning and before dinner. -At this visit, reviewing his log, his sugars are worse than before, whenever he checks.  This is most likely related to stress and poor diet.  We discussed about the absolute need to improve his diet to get to the previous HbA1c levels from last year.  He already increase Lantus from 30 to 40 units and is at the higher dose of Ozempic.  He did stop glipizide as advised at last visit.  At this visit, we discussed about adding mealtime insulin.  However, I plan to stop this if his sugars start to improve in the near future. - I suggested to:  Patient Instructions  Please  continue: - Metformin 1000 mg 2x a day, with meals - Lantus 40 units at bedtime - Ozempic 0.5 mg weekly  Please start: - Humalog 8-12 units 15 min before a meal  Please return in 3 months with your sugar log.   - we checked his HbA1c: 9.9% (much higher) - advised to check sugars at different times of the day - 3x a day, rotating check times - advised for yearly eye exams >> he is UTD - return to clinic in 3 months        2. HL - Reviewed latest lipid panel from 10/2018: All fractions at goal Lab Results  Component Value Date   CHOL 143 11/27/2018   HDL 60.90 11/27/2018   LDLCALC 64 11/27/2018   LDLDIRECT 103.9 11/05/2013   TRIG 91.0 11/27/2018   CHOLHDL 2 11/27/2018  - Continues Crestor without side effects.  3. Overweight -Continue Ozempic which should also help with weight loss -He did lose 3 pounds since last visit but I suspect that this may be due to poor diabetes control.  Philemon Kingdom, MD PhD [facility] Endocrinology

## 2019-06-12 ENCOUNTER — Encounter: Payer: Self-pay | Admitting: Internal Medicine

## 2019-07-22 ENCOUNTER — Other Ambulatory Visit: Payer: Self-pay | Admitting: Internal Medicine

## 2019-08-07 ENCOUNTER — Ambulatory Visit (INDEPENDENT_AMBULATORY_CARE_PROVIDER_SITE_OTHER): Payer: Medicare Other | Admitting: Internal Medicine

## 2019-08-07 ENCOUNTER — Encounter: Payer: Self-pay | Admitting: Internal Medicine

## 2019-08-07 ENCOUNTER — Other Ambulatory Visit: Payer: Self-pay

## 2019-08-07 VITALS — BP 118/74 | HR 90 | Temp 98.0°F | Ht 68.25 in | Wt 184.8 lb

## 2019-08-07 DIAGNOSIS — M25551 Pain in right hip: Secondary | ICD-10-CM | POA: Diagnosis not present

## 2019-08-07 DIAGNOSIS — Z23 Encounter for immunization: Secondary | ICD-10-CM

## 2019-08-07 DIAGNOSIS — M25552 Pain in left hip: Secondary | ICD-10-CM | POA: Diagnosis not present

## 2019-08-07 DIAGNOSIS — M25559 Pain in unspecified hip: Secondary | ICD-10-CM | POA: Insufficient documentation

## 2019-08-07 NOTE — Progress Notes (Signed)
Subjective:    Patient ID: David [name], male    DOB: 1951/06/12, 68 y.o.   MRN: [id number]  HPI Here due to hip pain  Having pain in both hips Progressive worsening--goes back at least 4 years Is on his feet all day on concrete floors---can be hard to even walk due to pain Uses a TENS unit on calf--this helps a little (back and hips) May have to sit out in car for 20 minutes--and this break will help  Feels it when sitting at times--especially if crossing his legs Does do some stretching for his hips  Not trying any medications Feels like it is hip related  Current Outpatient Medications on File Prior to Visit  Medication Sig Dispense Refill  . glucose blood (ONE TOUCH ULTRA TEST) test strip Use to test blood sugar 2 times daily as instructed. Dx: E11.59, E11.65 100 each 5  . Insulin Glargine (LANTUS SOLOSTAR) 100 UNIT/ML Solostar Pen INJECT 38 UNITS SUBCUTANEOUSLY ONCE DAILY AT  10PM 15 pen 5  . Insulin Lispro (HUMALOG KWIKPEN) 200 UNIT/ML SOPN Inject 8-12 Units into the skin 3 (three) times daily before meals. 5 pen 5  . Insulin Pen Needle 32G X 4 MM MISC Use 4x a day - with insulin 200 each 3  . losartan (COZAAR) 50 MG tablet Take 1.5 tablets (75 mg total) by mouth daily. 135 tablet 3  . metFORMIN (GLUCOPHAGE) 1000 MG tablet TAKE 1 TABLET BY MOUTH TWICE DAILY WITH MEALS 180 tablet 0  . Multiple Vitamin (MULTIVITAMIN WITH MINERALS) TABS tablet Take 1 tablet by mouth at bedtime.    Glory Rosebush DELICA LANCETS FINE MISC Use to test blood sugar 2 times daily as instructed. Dx: E11.59, E11.65 100 each 5  . pantoprazole (PROTONIX) 40 MG tablet Take 1 tablet (40 mg total) by mouth 2 (two) times daily. 180 tablet 3  . PARoxetine (PAXIL) 20 MG tablet TAKE 2 TABLETS BY MOUTH ONCE DAILY FOR DEPRESSION 180 tablet 3  . propranolol (INDERAL) 10 MG tablet Take 1 tablet (10 mg total) by mouth 3 (three) times daily. 90 tablet 5  . rosuvastatin (CRESTOR) 10 MG tablet Take 1 tablet by mouth once  daily 90 tablet 0  . Semaglutide,0.25 or 0.5MG /DOS, (OZEMPIC, 0.25 OR 0.5 MG/DOSE,) 2 MG/1.5ML SOPN Inject 0.5 mg into the skin once a week. 2 pen 5  . sildenafil (REVATIO) 20 MG tablet Take 3-5 tablets (60-100 mg total) by mouth daily as needed (for ED). 50 tablet 11  . thiamine 100 MG tablet Take 1 tablet (100 mg total) by mouth daily. 30 tablet 0  . triamcinolone cream (KENALOG) 0.1 % Apply 1 application topically 2 (two) times daily as needed (dry scalp). 30 g 0   No current facility-administered medications on file prior to visit.     Allergies  Allergen Reactions  . Venlafaxine     Slowed his urine stream    Past Medical History:  Diagnosis Date  . Allergy   . Arthritis   . CAD (coronary artery disease)    s/p bifurcation stenting (DES x2) and stenting bifurcation lesion distal RCA (1 DES)  . Cataract   . Depression   . Diabetes mellitus   . GERD (gastroesophageal reflux disease)   . Hyperlipemia   . Hypertension   . Myocardial infarction ([place])   . Sleep apnea    CPAP    Past Surgical History:  Procedure Laterality Date  . BIOPSY  09/01/2018   Procedure: BIOPSY;  Surgeon: Silverio Decamp,  Venia Minks, MD;  Location: [place];  Service: Endoscopy;;  . CATARACT EXTRACTION Bilateral   . CORONARY ANGIOPLASTY WITH STENT PLACEMENT   March 2010   Dr. Olevia Perches  . ESOPHAGOGASTRODUODENOSCOPY (EGD) WITH PROPOFOL N/A 09/01/2018   Procedure: ESOPHAGOGASTRODUODENOSCOPY (EGD) WITH PROPOFOL;  Surgeon: Mauri Pole, MD;  Location: [facility];  Service: Endoscopy;  Laterality: N/A;  . LEFT HEART CATH AND CORONARY ANGIOGRAPHY N/A 08/31/2018   Procedure: LEFT HEART CATH AND CORONARY ANGIOGRAPHY;  Surgeon: Sherren Mocha, MD;  Location: [place];  Service: Cardiovascular;  Laterality: N/A;  . TONSILLECTOMY  1959    Family History  Problem Relation Age of Onset  . Heart failure Father        Deceased 53 y/o  . Diabetes Father   . Cancer Father        Prostate  . Heart  failure Mother        Deceased 36 y/o  . Alcohol abuse Mother   . Schizophrenia Son        Paranoid  . Uveitis Son   . Cancer Brother        prostate  . Colon cancer Neg Hx   . Esophageal cancer Neg Hx   . Stomach cancer Neg Hx   . Liver cancer Neg Hx   . Pancreatic cancer Neg Hx   . Rectal cancer Neg Hx     Social History   Socioeconomic History  . Marital status: Married    Spouse name: Not on file  . Number of children: Not on file  . Years of education: Not on file  . Highest education level: Not on file  Occupational History  . Occupation: Patent examiner buildings    Comment: site closed  . Occupation: Engineering geologist    Comment: part time  Social Needs  . Financial resource strain: Not on file  . Food insecurity    Worry: Not on file    Inability: Not on file  . Transportation needs    Medical: Not on file    Non-medical: Not on file  Tobacco Use  . Smoking status: Former Smoker    Types: Cigarettes    Quit date: 11/30/1983    Years since quitting: 35.7  . Smokeless tobacco: Never Used  Substance and Sexual Activity  . Alcohol use: Yes    Alcohol/week: 20.0 standard drinks    Types: 20 Standard drinks or equivalent per week    Comment: beer  . Drug use: No  . Sexual activity: Not on file  Lifestyle  . Physical activity    Days per week: Not on file    Minutes per session: Not on file  . Stress: Not on file  Relationships  . Social Herbalist on phone: Not on file    Gets together: Not on file    Attends religious service: Not on file    Active member of club or organization: Not on file    Attends meetings of clubs or organizations: Not on file    Relationship status: Not on file  . Intimate partner violence    Fear of current or ex partner: Not on file    Emotionally abused: Not on file    Physically abused: Not on file    Forced sexual activity: Not on file  Other Topics Concern  . Not on file  Social History Narrative    No living will   Would want wife, then son [name],  as health care POA   Would accept resuscitation   Not sure about tube feeds--but doesn't want if cognitively unaware   Review of Systems No fever No joint swelling    Objective:   Physical Exam  Constitutional: He appears well-developed. No distress.  Cardiovascular: Intact distal pulses.  Musculoskeletal:     Comments: No spine tenderness ---or back at all SLR negative ROM in hips is fairly normal  Neurological:  Normal gait for short distances here No leg weakness           Assessment & Plan:

## 2019-08-07 NOTE — Assessment & Plan Note (Signed)
I suspect this is from lumbar spinal stenosis--not hip arthritis No evidence of vascular compromise Discussed options---will set up with ortho to evaluate (may need MRI if they agree it isn't his hips)

## 2019-08-07 NOTE — Addendum Note (Signed)
Addended by: Virl Cagey on: 08/07/2019 08:06 AM   Modules accepted: Orders

## 2019-08-13 ENCOUNTER — Other Ambulatory Visit: Payer: Self-pay | Admitting: Internal Medicine

## 2019-08-14 ENCOUNTER — Other Ambulatory Visit: Payer: Self-pay | Admitting: Internal Medicine

## 2019-08-20 ENCOUNTER — Ambulatory Visit: Payer: Self-pay

## 2019-08-20 ENCOUNTER — Ambulatory Visit (INDEPENDENT_AMBULATORY_CARE_PROVIDER_SITE_OTHER): Payer: Medicare Other | Admitting: Orthopaedic Surgery

## 2019-08-20 ENCOUNTER — Encounter: Payer: Self-pay | Admitting: Orthopaedic Surgery

## 2019-08-20 DIAGNOSIS — M79605 Pain in left leg: Secondary | ICD-10-CM

## 2019-08-20 DIAGNOSIS — M79604 Pain in right leg: Secondary | ICD-10-CM

## 2019-08-20 MED ORDER — GABAPENTIN 100 MG PO CAPS: 100.0000 mg | ORAL_CAPSULE | Freq: Three times a day (TID) | ORAL | 2 refills | Status: DC

## 2019-08-20 MED ORDER — TIZANIDINE HCL 4 MG PO TABS: 4.0000 mg | ORAL_TABLET | Freq: Two times a day (BID) | ORAL | 1 refills | Status: DC | PRN

## 2019-08-20 NOTE — Addendum Note (Signed)
Addended byLaurann Montana on: 08/20/2019 11:09 AM   Modules accepted: Orders

## 2019-08-20 NOTE — Progress Notes (Signed)
                                                                                                                                                                                                                                                                                                                                                                                                                                                                                                                                                                               +++++++++                                                                                                                                                                                           Office Visit Note   Patient: David [name]           Date of Birth: 08/08/1951           MRN: [id number] Visit Date: 08/20/2019              Requested by: [name], MD [place],  [place] [address] PCP: [name], MD   Assessment & Plan: Visit Diagnoses:  1. Bilateral leg pain     Plan: The patient has quite severe degenerative changes on plain film of the lumbar spine to the point that an MRI is  warranted to assess for nerve compression.  He understands that he cannot get a steroid injection anytime soon until his blood glucose is under better control.  I will try Neurontin and Zanaflex and see how this helps with his symptoms.  We will see him back after the MRI which will hopefully help ascertain the degree of stenosis and nerve compression so as to help come up with treatment options.  All question concerns were answered and addressed.  Follow-Up Instructions: Follow-up after the MRI.  Orders:  Orders Placed This Encounter  Procedures  . XR Lumbar Spine 2-3 Views  . XR Pelvis 1-2 Views   Meds ordered this encounter  Medications  . gabapentin (NEURONTIN) 100 MG capsule    Sig: Take 1 capsule (100 mg total) by mouth 3 (three) times daily.    Dispense:  60 capsule    Refill:  2  . tiZANidine (ZANAFLEX) 4 MG tablet    Sig: Take 1 tablet (4 mg total) by mouth 2 (two) times daily as needed for muscle spasms.    Dispense:  40 tablet    Refill:  1      Procedures: No procedures performed   Clinical Data: No additional findings.   Subjective: Chief Complaint  Patient presents with  . Leg Pain  The patient is a very pleasant 68 year old gentleman who comes in with what he reports bilateral hip pain the right worse than left he points to his sciatic area in the lower back a source of his pain.  Is been getting worse for 3 to 4 years now.  He is on concrete standing all day long.  He denies any groin pain but he does report numbness and tingling going down both legs.  He denies any change in bowel bladder function.  He is tried some stretching exercises before but now that does not help.  He does report being a diabetic and actually reports that he is not had good diabetic control with his hemoglobin A1c of over 9 and his daily blood sugars running over 200.  He denies any groin pain bilaterally.  HPI  Review of Systems He currently denies any headache, chest pain, short of  breath, fever, chills, nausea, vomiting  Objective: Vital Signs: There were no vitals taken for this visit.  Physical Exam He is alert and orient x3 and in no acute distress Ortho Exam Examination of both hips show the move fluidly and smoothly with no pain in the groin at all.  He has significant limitations of flexion extension of the lumbar spine and significant stiffness.  He has pain to palpation in the lower lumbar spinal elements around the paraspinal muscles and the facet joints.  He has pain in the sciatic region on both sides with the right worse than left. Specialty Comments:  No specialty comments available.  Imaging: Xr Lumbar  Spine 2-3 Views  Result Date: 08/20/2019 2 views of the lumbar spine shows severe degenerative changes at multiple levels.  There is complete loss of lumbar lordosis and severe stenosis at multiple levels.  Xr Pelvis 1-2 Views  Result Date: 08/20/2019 An AP pelvis shows no significant arthritic changes in either hip and the joint space is well-maintained.  There is otherwise no acute findings.    PMFS History: Patient Active Problem List   Diagnosis Date Noted  . Hip pain 08/07/2019  . History of esophageal varices 09/15/2018  . Alcoholic cirrhosis of liver without ascites ([place])   . Secondary esophageal varices without bleeding ([place])   . Anemia 08/31/2018  . Overweight 09/13/2017  . Poorly controlled type 2 diabetes mellitus with circulatory disorder ([place]) 01/18/2017  . Elevated liver function tests 07/13/2016  . Thrombocytopenia ([place]) 07/13/2016  . Advance directive discussed with patient 05/16/2015  . Routine general medical examination at a health care facility 02/16/2011  . Erectile dysfunction 02/16/2011  . Essential hypertension 12/21/2010  . Hyperlipidemia LDL goal <70 06/24/2009  . Osteoarthritis, multiple sites 04/16/2009  . CAD (coronary artery disease) 02/04/2009  . Major depressive disorder, recurrent episode ([place]) 08/21/2008   . ALLERGIC RHINITIS 08/21/2008  . SLEEP APNEA 08/21/2008   Past Medical History:  Diagnosis Date  . Allergy   . Arthritis   . CAD (coronary artery disease)    s/p bifurcation stenting (DES x2) and stenting bifurcation lesion distal RCA (1 DES)  . Cataract   . Depression   . Diabetes mellitus   . GERD (gastroesophageal reflux disease)   . Hyperlipemia   . Hypertension   . Myocardial infarction ([place])   . Sleep apnea    CPAP    Family History  Problem Relation Age of Onset  . Heart failure Father        Deceased 46 y/o  . Diabetes Father   . Cancer Father        Prostate  . Heart failure Mother        Deceased 15 y/o  . Alcohol abuse Mother   . Schizophrenia Son        Paranoid  . Uveitis Son   . Cancer Brother        prostate  . Colon cancer Neg Hx   . Esophageal cancer Neg Hx   . Stomach cancer Neg Hx   . Liver cancer Neg Hx   . Pancreatic cancer Neg Hx   . Rectal cancer Neg Hx     Past Surgical History:  Procedure Laterality Date  . BIOPSY  09/01/2018   Procedure: BIOPSY;  Surgeon: Mauri Pole, MD;  Location: [facility];  Service: Endoscopy;;  . CATARACT EXTRACTION Bilateral   . CORONARY ANGIOPLASTY WITH STENT PLACEMENT   March 2010   Dr. Olevia Perches  . ESOPHAGOGASTRODUODENOSCOPY (EGD) WITH PROPOFOL N/A 09/01/2018   Procedure: ESOPHAGOGASTRODUODENOSCOPY (EGD) WITH PROPOFOL;  Surgeon: Mauri Pole, MD;  Location: [facility];  Service: Endoscopy;  Laterality: N/A;  . LEFT HEART CATH AND CORONARY ANGIOGRAPHY N/A 08/31/2018   Procedure: LEFT HEART CATH AND CORONARY ANGIOGRAPHY;  Surgeon: Sherren Mocha, MD;  Location: [facility];  Service: Cardiovascular;  Laterality: N/A;  . TONSILLECTOMY  1959   Social History   Occupational History  . Occupation: Patent examiner buildings    Comment: site closed  . Occupation: Engineering geologist    Comment: part time  Tobacco Use  . Smoking status: Former Smoker    Types:  Cigarettes    Quit date:  11/30/1983    Years since quitting: 35.7  . Smokeless tobacco: Never Used  Substance and Sexual Activity  . Alcohol use: Yes    Alcohol/week: 20.0 standard drinks    Types: 20 Standard drinks or equivalent per week    Comment: beer  . Drug use: No  . Sexual activity: Not on file

## 2019-08-22 ENCOUNTER — Encounter: Payer: Self-pay | Admitting: Orthopaedic Surgery

## 2019-09-03 ENCOUNTER — Other Ambulatory Visit: Payer: Self-pay | Admitting: Radiology

## 2019-09-03 ENCOUNTER — Telehealth: Payer: Self-pay | Admitting: Physical Therapy

## 2019-09-03 DIAGNOSIS — M79604 Pain in right leg: Secondary | ICD-10-CM

## 2019-09-03 NOTE — Telephone Encounter (Signed)
Attempted to contact patient regarding order for physical therapy received (to inquire if interested in "in person" vs. telehealth visits given Covid risk factors). Left voicemail to return call to clinic.

## 2019-09-06 ENCOUNTER — Encounter: Payer: Self-pay | Admitting: Orthopaedic Surgery

## 2019-09-14 ENCOUNTER — Other Ambulatory Visit: Payer: Self-pay

## 2019-09-14 ENCOUNTER — Other Ambulatory Visit: Payer: Self-pay | Admitting: Internal Medicine

## 2019-09-14 DIAGNOSIS — E1165 Type 2 diabetes mellitus with hyperglycemia: Secondary | ICD-10-CM

## 2019-09-14 DIAGNOSIS — E1159 Type 2 diabetes mellitus with other circulatory complications: Secondary | ICD-10-CM

## 2019-09-17 ENCOUNTER — Encounter: Payer: Self-pay | Admitting: Internal Medicine

## 2019-09-17 ENCOUNTER — Encounter: Payer: Self-pay | Admitting: Orthopaedic Surgery

## 2019-09-17 ENCOUNTER — Ambulatory Visit (INDEPENDENT_AMBULATORY_CARE_PROVIDER_SITE_OTHER): Payer: Medicare Other | Admitting: Internal Medicine

## 2019-09-17 ENCOUNTER — Other Ambulatory Visit: Payer: Self-pay

## 2019-09-17 DIAGNOSIS — E1159 Type 2 diabetes mellitus with other circulatory complications: Secondary | ICD-10-CM

## 2019-09-17 DIAGNOSIS — E1165 Type 2 diabetes mellitus with hyperglycemia: Secondary | ICD-10-CM

## 2019-09-17 LAB — POCT GLYCOSYLATED HEMOGLOBIN (HGB A1C): Hemoglobin A1C: 9.3 % — AB (ref 4.0–5.6)

## 2019-09-17 MED ORDER — HUMALOG KWIKPEN 200 UNIT/ML ~~LOC~~ SOPN: 14.0000 [IU] | PEN_INJECTOR | Freq: Three times a day (TID) | SUBCUTANEOUS | 5 refills | Status: DC

## 2019-09-17 MED ORDER — LANTUS SOLOSTAR 100 UNIT/ML ~~LOC~~ SOPN: PEN_INJECTOR | SUBCUTANEOUS | 3 refills | Status: DC

## 2019-09-17 NOTE — Progress Notes (Signed)
Patient ID: David [name], male   DOB: 1951/02/22, 68 y.o.   MRN: [id number]   HPI: David [name] is a 68 y.o.-year-old male, returning for f/u for DM2, dx in ~2008, insulin-dependent since 09/2015, uncontrolled, with complications (CAD - s/p stents in 2013, PN). Last visit 3 months ago.  Since last visit, his diet did not improve he mentions that he is still drinking a significant amount of alcohol.  Sugars are still high.  Moreover, he cannot afford Ozempic anymore-stopped 2 weeks ago.  Last hemoglobin A1c was: Lab Results  Component Value Date   HGBA1C 9.9 (A) 06/11/2019   HGBA1C 7.4 (A) 02/06/2019   HGBA1C 7.0 (H) 08/28/2018  03/05/2016: HbA1c 9.1%  He is on: - Metformin 1000 mg 2x a day, with meals - Lantus 40 units at bedtime - - started 01/2019, stopped 08/2019 ($$$) - Humalog 8-12 units 15 min before a meal - started 05/2019 He was on Bydureon 2 mg weekly - started 01/30/2017 >> stopped 04/2017 b/c price - could not restart  Pt checks his sugars 2 times a day per review of his log: - am:   92, 107-247, 280 >> 157-322, 363 >> 208-287, 300, 329 - 2h after b'fast: n/c  - before lunch: 118-210 >> 85-187, 201 >> 158, 327 >> n/c - 2h after lunch: n/c - before dinner: 110-253 >> 68, 119-265 >> 200-360 >> 164-280, 300 - 2h after dinner: n/c - bedtime:  126-209, 225-240 (candy), 261 >> 160-215 >> n/c - nighttime: n/c Lowest sugar was 68 >> 157 >> 164; it is unclear at which CBG level she has hypoglycemia awareness.. Highest sugar was 280 >> 363 >> 329.  Glucometer: One Touch  Pt's meals are: - Breakfast: protein shake - Lunch: n/a - Dinner: meat + veggies /potatoes or salad - Snacks: no Diet sodas, ice tea with splenda.  He still eats many sweets. Drinks at least 5 beers a day.  -No CKD, last BUN/creatinine:  Lab Results  Component Value Date   BUN 8 08/31/2018   CREATININE 0.94 08/31/2018  On losartan. -+ HL; last set of lipids: Lab Results  Component Value Date   CHOL  143 11/27/2018   HDL 60.90 11/27/2018   LDLCALC 64 11/27/2018   LDLDIRECT 103.9 11/05/2013   TRIG 91.0 11/27/2018   CHOLHDL 2 11/27/2018  On Crestor. - last eye exam was in 09/2018: No DR.  Has previous history of cataract surgery. -He has numbness and tingling in his feet  He also has HTN, depression.  He has a history of anabolic steroid use for muscle building.  He had a cardiac cath 08/2018 for presyncope: Stable coronary anatomy without high-grade coronary stenosis.  He is on medical therapy for CAD.  He was found to be anemic (Hb 8.1) >> EGD report reviewed:  - Non-bleeding grade II esophageal varices. - Non-bleeding gastric ulcers with a clean ulcer base (Forrest Class III). Biopsied. - Erythematous duodenopathy. - Normal second portion of the duodenum.  ROS: Constitutional: + weight gain/no weight loss, no fatigue, no subjective hyperthermia, no subjective hypothermia Eyes: no blurry vision, no xerophthalmia ENT: no sore throat, no nodules palpated in neck, no dysphagia, no odynophagia, no hoarseness Cardiovascular: no CP/no SOB/no palpitations/no leg swelling Respiratory: no cough/no SOB/no wheezing Gastrointestinal: no N/no V/no D/no C/no acid reflux Musculoskeletal: no muscle aches/no joint aches Skin: no rashes, no hair loss Neurological: no tremors/+ numbness/+ tingling/no dizziness  I reviewed pt's medications, allergies, PMH, social hx, family hx, and changes were  documented in the history of present illness. Otherwise, unchanged from my initial visit note.   Past Medical History:  Diagnosis Date  . Allergy   . Arthritis   . CAD (coronary artery disease)    s/p bifurcation stenting (DES x2) and stenting bifurcation lesion distal RCA (1 DES)  . Cataract   . Depression   . Diabetes mellitus   . GERD (gastroesophageal reflux disease)   . Hyperlipemia   . Hypertension   . Myocardial infarction ([place])   . Sleep apnea    CPAP   Past Surgical History:   Procedure Laterality Date  . BIOPSY  09/01/2018   Procedure: BIOPSY;  Surgeon: Mauri Pole, MD;  Location: [facility];  Service: Endoscopy;;  . CATARACT EXTRACTION Bilateral   . CORONARY ANGIOPLASTY WITH STENT PLACEMENT   March 2010   Dr. Olevia Perches  . ESOPHAGOGASTRODUODENOSCOPY (EGD) WITH PROPOFOL N/A 09/01/2018   Procedure: ESOPHAGOGASTRODUODENOSCOPY (EGD) WITH PROPOFOL;  Surgeon: Mauri Pole, MD;  Location: [facility];  Service: Endoscopy;  Laterality: N/A;  . LEFT HEART CATH AND CORONARY ANGIOGRAPHY N/A 08/31/2018   Procedure: LEFT HEART CATH AND CORONARY ANGIOGRAPHY;  Surgeon: Sherren Mocha, MD;  Location: [facility];  Service: Cardiovascular;  Laterality: N/A;  . TONSILLECTOMY  1959   Social History   Social History  . Marital Status: Married    Spouse Name: N/A  . Number of Children: 2   Occupational History  . 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 Smoking status: Former Smoker    Types: Cigarettes    Quit date: 11/30/1983  . Smokeless tobacco: Never Used  . Alcohol Use:  beer, 3-4 times a week, 6-8 drinks at the time       . Drug Use: No   Social History Narrative   No living will   Would want wife, then son [name], as health care POA   Would accept resuscitation   Not sure about tube feeds   Current Outpatient Medications on File Prior to Visit  Medication Sig Dispense Refill  . gabapentin (NEURONTIN) 100 MG capsule Take 1 capsule (100 mg total) by mouth 3 (three) times daily. 60 capsule 2  . glucose blood (ONE TOUCH ULTRA TEST) test strip Use to test blood sugar 2 times daily as instructed. Dx: E11.59, E11.65 100 each 5  . Insulin Glargine (LANTUS SOLOSTAR) 100 UNIT/ML Solostar Pen INJECT 34 UNITS SUBCUTANEOUSLY ONCE DAILY AT 10 PM. 15 mL 0  . Insulin Lispro (HUMALOG KWIKPEN) 200 UNIT/ML SOPN Inject 8-12 Units into the skin 3 (three) times daily before meals. 5 pen 5  . Insulin  Pen Needle 32G X 4 MM MISC Use 4x a day - with insulin 200 each 3  . losartan (COZAAR) 50 MG tablet Take 1.5 tablets (75 mg total) by mouth daily. 135 tablet 3  . metFORMIN (GLUCOPHAGE) 1000 MG tablet TAKE 1 TABLET BY MOUTH TWICE DAILY WITH MEALS 180 tablet 0  . Multiple Vitamin (MULTIVITAMIN WITH MINERALS) TABS tablet Take 1 tablet by mouth at bedtime.    Glory Rosebush DELICA LANCETS FINE MISC Use to test blood sugar 2 times daily as instructed. Dx: E11.59, E11.65 100 each 5  . pantoprazole (PROTONIX) 40 MG tablet Take 1 tablet (40 mg total) by mouth 2 (two) times daily. 180 tablet 3  . PARoxetine (PAXIL) 20 MG tablet TAKE 2 TABLETS BY MOUTH ONCE DAILY FOR DEPRESSION  180 tablet 3  . propranolol (INDERAL) 10 MG tablet Take 1 tablet (10 mg total) by mouth 3 (three) times daily. 90 tablet 5  . rosuvastatin (CRESTOR) 10 MG tablet Take 1 tablet by mouth once daily 90 tablet 3  . Semaglutide,0.25 or 0.5MG /DOS, (OZEMPIC, 0.25 OR 0.5 MG/DOSE,) 2 MG/1.5ML SOPN Inject 0.5 mg into the skin once a week. 2 pen 5  . sildenafil (REVATIO) 20 MG tablet Take 3-5 tablets (60-100 mg total) by mouth daily as needed (for ED). 50 tablet 11  . thiamine 100 MG tablet Take 1 tablet (100 mg total) by mouth daily. 30 tablet 0  . tiZANidine (ZANAFLEX) 4 MG tablet Take 1 tablet (4 mg total) by mouth 2 (two) times daily as needed for muscle spasms. 40 tablet 1  . triamcinolone cream (KENALOG) 0.1 % APPLY TOPICALLY TO SCALP TWO TIMES DAILY AS NEEDED 30 g 0   No current facility-administered medications on file prior to visit.    Allergies  Allergen Reactions  . Venlafaxine     Slowed his urine stream   Family History  Problem Relation Age of Onset  . Heart failure Father        Deceased 67 y/o  . Diabetes Father   . Cancer Father        Prostate  . Heart failure Mother        Deceased 16 y/o  . Alcohol abuse Mother   . Schizophrenia Son        Paranoid  . Uveitis Son   . Cancer Brother        prostate  . Colon  cancer Neg Hx   . Esophageal cancer Neg Hx   . Stomach cancer Neg Hx   . Liver cancer Neg Hx   . Pancreatic cancer Neg Hx   . Rectal cancer Neg Hx    PE: BP (!) 150/80   Pulse 88   Ht 5' 8.25" (1.734 m)   Wt 190 lb (86.2 kg)   SpO2 99%   BMI 28.68 kg/m  Body mass index is 27.89 kg/m. Wt Readings from Last 3 Encounters:  09/17/19 190 lb (86.2 kg)  08/07/19 184 lb 12.8 oz (83.8 kg)  06/11/19 184 lb (83.5 kg)   Constitutional: overweight, in NAD Eyes: PERRLA, EOMI, no exophthalmos ENT: moist mucous membranes, no thyromegaly, no cervical lymphadenopathy Cardiovascular: RRR, No MRG Respiratory: CTA B Gastrointestinal: abdomen soft, NT, ND, BS+ Musculoskeletal: no deformities, strength intact in all 4 Skin: moist, warm, no rashes Neurological: no tremor with outstretched hands, DTR normal in all 4  ASSESSMENT: 1. DM2, insulin-dependent, uncontrolled, with complications - CAD - s/p 4 stents 2013 - seeing Dr Burt Knack - PN  Component     Latest Ref Rng 06/09/2016  C-Peptide     0.80-3.85 ng/mL 1.78  Glucose, Fasting     65 - 99 mg/dL 184 (H)  Glutamic Acid Decarb Ab     <5 IU/mL <5  Pancreatic Islet Cell Antibody     < 5 JDF Units <5  Slight insulin deficiency, but no clear type 1 diabetes.  2. HL  3. Overweight  PLAN:  1. Patient with longstanding, uncontrolled, type 2 diabetes, with increased variability of his blood sugars.  He is on long-acting insulin, weekly GLP-1 receptor agonist, Metformin, and at last visit we had to add mealtime insulin.  He sugars at that time were worse than before due to stress and poor diet.  We discussed about the absolute need to improve  his diet. -At this visit, sugars are still high and quite fluctuating.  He is off Ozempic due to price.  He had to stop 2 weeks ago.  Therefore, for now, we will need to stay on the on the basal-bolus insulin regimen and Metformin.  Since all his sugars are above target, will increase Lantus and Humalog.   Ultimately, he needs to improve his diet and stop drinking alcohol. - I suggested to:  Patient Instructions  Please continue: - Metformin 1000 mg 2x a day, with meals  Please increase: - Lantus 48 units at bedtime - Humalog 14-20 units 15 min before a meal  Let me know when we can add Ozempic back.  Please return in 3 months with your sugar log.   - we checked his HbA1c: 9.3% (better) - advised to check sugars at different times of the day - 3-4x a day, rotating check times - advised for yearly eye exams >> he is UTD - UTD with flu shot - return to clinic in 3 months       2. HL -Reviewed latest lipid panel from 10/2018: All fractions at goal Lab Results  Component Value Date   CHOL 143 11/27/2018   HDL 60.90 11/27/2018   LDLCALC 64 11/27/2018   LDLDIRECT 103.9 11/05/2013   TRIG 91.0 11/27/2018   CHOLHDL 2 11/27/2018  -Continues Crestor without side effects  3. Overweight -She gained 6 pounds since last visit -Unfortunately, he came off Ozempic since this was too expensive.  This was helping with weight loss, also and also to suppress his appetite.  It is possible that he may be able to restart this after the first of the year -For now, we discussed about the importance of improving his diet by cutting out sweets and especially cutting out alcohol  Philemon Kingdom, MD PhD [facility]

## 2019-09-17 NOTE — Patient Instructions (Addendum)
Please continue: - Metformin 1000 mg 2x a day, with meals  Please increase: - Lantus 48 units at bedtime - Humalog 14-20 units 15 min before a meal  Let me know when we can add Ozempic back.  Please return in 3 months with your sugar log.

## 2019-09-17 NOTE — Addendum Note (Signed)
Addended by: Cardell Peach I on: 09/17/2019 10:18 AM   Modules accepted: Orders

## 2019-09-18 ENCOUNTER — Other Ambulatory Visit: Payer: Medicare Other

## 2019-09-20 ENCOUNTER — Encounter: Payer: Self-pay | Admitting: Orthopaedic Surgery

## 2019-09-25 ENCOUNTER — Ambulatory Visit: Payer: Medicare Other | Admitting: Orthopaedic Surgery

## 2019-09-30 ENCOUNTER — Other Ambulatory Visit: Payer: Self-pay | Admitting: Internal Medicine

## 2019-10-08 ENCOUNTER — Ambulatory Visit: Payer: Medicare Other | Attending: Orthopaedic Surgery

## 2019-10-08 ENCOUNTER — Other Ambulatory Visit: Payer: Self-pay

## 2019-10-08 DIAGNOSIS — M48061 Spinal stenosis, lumbar region without neurogenic claudication: Secondary | ICD-10-CM | POA: Diagnosis not present

## 2019-10-08 DIAGNOSIS — M62830 Muscle spasm of back: Secondary | ICD-10-CM

## 2019-10-08 DIAGNOSIS — R293 Abnormal posture: Secondary | ICD-10-CM | POA: Diagnosis not present

## 2019-10-08 NOTE — Therapy (Signed)
[place], [place], [address] Phone: [phone]   Fax:  [phone]  Physical Therapy Evaluation  Patient Details  Name: David [name] MRN: [id number] Date of Birth: 1951/03/17 Referring Provider (PT): Jean Rosenthal, MD    Encounter Date: 10/08/2019  PT End of Session - 10/08/19 1209    Visit Number  1    Number of Visits  12    Date for PT Re-Evaluation  11/16/19    Authorization Type  BCBS  MCR    Authorization Time Period  progress at visit 10  KX at visit 15    PT Start Time  1215    PT Stop Time  1300    PT Time Calculation (min)  45 min    Activity Tolerance  Patient tolerated treatment well    Behavior During Therapy  [facility] for tasks assessed/performed       Past Medical History:  Diagnosis Date  . Allergy   . Arthritis   . CAD (coronary artery disease)    s/p bifurcation stenting (DES x2) and stenting bifurcation lesion distal RCA (1 DES)  . Cataract   . Depression   . Diabetes mellitus   . GERD (gastroesophageal reflux disease)   . Hyperlipemia   . Hypertension   . Myocardial infarction ([place])   . Sleep apnea    CPAP    Past Surgical History:  Procedure Laterality Date  . BIOPSY  09/01/2018   Procedure: BIOPSY;  Surgeon: Mauri Pole, MD;  Location: [place];  Service: Endoscopy;;  . CATARACT EXTRACTION Bilateral   . CORONARY ANGIOPLASTY WITH STENT PLACEMENT   March 2010   Dr. Olevia Perches  . ESOPHAGOGASTRODUODENOSCOPY (EGD) WITH PROPOFOL N/A 09/01/2018   Procedure: ESOPHAGOGASTRODUODENOSCOPY (EGD) WITH PROPOFOL;  Surgeon: Mauri Pole, MD;  Location: [place] [facility];  Service: Endoscopy;  Laterality: N/A;  . LEFT HEART CATH AND CORONARY ANGIOGRAPHY N/A 08/31/2018   Procedure: LEFT HEART CATH AND CORONARY ANGIOGRAPHY;  Surgeon: Sherren Mocha, MD;  Location: [place] [facility];  Service: Cardiovascular;  Laterality: N/A;  . TONSILLECTOMY  1959    There were no  vitals filed for this visit.   Subjective Assessment - 10/08/19 1219    Subjective  He reports issues in lower back relating to back pain and bilateral buttock with mild to severe intensity.   Onset 5 years ago. 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    Limitations  Standing;Walking   bend and stoop   Diagnostic tests  Xray: DDD, stennosis    Patient Stated Goals  Hope to get some releif from pain    Currently in Pain?  No/denies    Pain Location  Back   and buttock /hip   Pain Orientation  Right;Left;Lower   more on RT.   Pain Descriptors / Indicators  Aching    Pain Type  Chronic pain    Pain Radiating Towards  buttocks    Pain Onset  More than a month ago    Pain Frequency  Intermittent    Aggravating Factors   work bending and stooping    Pain Relieving Factors  rest off feet.         [facility] PT Assessment - 10/08/19 0001      Assessment   Medical Diagnosis  spinal stenosis  Referring Provider (PT)  Jean Rosenthal, MD     Onset Date/Surgical Date  --   5 yeears ago   Next MD Visit  PRN    Prior Therapy  no      Precautions   Precautions  None      Restrictions   Weight Bearing Restrictions  No      Balance Screen   Has the patient fallen in the past 6 months  Yes    How many times?  3   Can't remenber but occasional LOB , slipped parking lot rain   Has the patient had a decrease in activity level because of a fear of falling?   No    Is the patient reluctant to leave their home because of a fear of falling?   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 RT leg postured in ER  >LT       ROM  / Strength   AROM / PROM / Strength  AROM;PROM;Strength      AROM   Overall AROM Comments  LTR WFL.     AROM Assessment Site  Lumbar    Lumbar Flexion  65    Lumbar Extension  25    Lumbar - Right Side Bend  12    Lumbar - Left Side Bend  15      PROM   Overall PROM Comments  RT hip IR 1/2 of LT hip IR         Strength   Overall Strength Comments  Normal LE strength and good abdominal strength      Flexibility   Soft Tissue Assessment /Muscle Length  yes    Hamstrings  SLR 65-70 degrees        Palpation   Palpation comment  Supine LT leg short.  Tender Ql and SI area and gluteals       Ambulation/Gait   Gait Comments  WNL                Objective measurements completed on examination: See above findings.              PT Education - 10/08/19 1316    Education Details  POC , HEP    Person(s) Educated  Patient    Methods  Explanation;Tactile cues;Verbal cues;Handout    Comprehension  Returned demonstration;Verbalized understanding       PT Short Term Goals - 10/08/19 1318      PT SHORT TERM GOAL #1   Title  He will be indpenent with initial hEP issued    Time  3    Period  Weeks    Status  New      PT SHORT TERM GOAL #2   Title  He will report 20% decrease pain with work activity.    Time  3    Period  Weeks    Status  New        PT Long Term Goals - 10/08/19 1319      PT LONG TERM GOAL #1   Title  He will be indpendent with all hEP issued.    Time  6    Period  Weeks    Status  New      PT LONG TERM GOAL #2   Title  He will report pain decreased 50% or more in back and hips  with work.    Time  6    Period  Weeks    Status  New      PT LONG TERM GOAL #3   Title  FOTO score with improve to  36 % limited    Time  6    Period  Weeks    Status  New             Plan - 10/08/19 1210    Clinical Impression Statement  Mr Neufer presents with intermitant LBP and pain into both buttock.   He is tender along both SI joints.    His strength was normal but spne ROM decreased.  He should be able to show some improvement with skilled PT and consistent HEP.   His job tasks are a major impediment to progress so will also wotrk on mechanics to decrease bending and twisting of spine of spine.    Personal Factors and Comorbidities  Age;Time since onset of injury/illness/exacerbation    Examination-Activity Limitations  Locomotion Level;Squat;Stand;Sit    Examination-Participation Restrictions  --   work   Stability/Clinical Decision Making  Stable/Uncomplicated    Clinical Decision Making  Low    Rehab Potential  Good    PT Frequency  2x / week    PT Duration  6 weeks    PT Treatment/Interventions  Traction;Moist Heat;Electrical Stimulation;Functional mobility training;Therapeutic activities;Therapeutic exercise;Patient/family education;Manual techniques;Taping;Passive range of motion;Dry needling       Patient will benefit from skilled therapeutic intervention in order to improve the following deficits and impairments:  Pain, Decreased activity tolerance, Postural dysfunction, Increased muscle spasms, Decreased range of motion  Visit Diagnosis: Spinal stenosis of lumbar region without neurogenic claudication  Abnormal posture  Muscle spasm of back     Problem List Patient Active Problem List   Diagnosis Date Noted  . Hip pain 08/07/2019  . History of esophageal varices 09/15/2018  . Alcoholic cirrhosis of liver without ascites ([place])   . Secondary esophageal varices without bleeding ([place])   . Anemia 08/31/2018  . Overweight 09/13/2017  . Poorly controlled type 2 diabetes mellitus with circulatory disorder ([place]) 01/18/2017  . Elevated liver function tests 07/13/2016  . Thrombocytopenia ([place]) 07/13/2016  . Advance directive discussed with patient 05/16/2015  . Routine general medical examination at a health care facility 02/16/2011  . Erectile dysfunction 02/16/2011  . Essential hypertension 12/21/2010  .  Hyperlipidemia LDL goal <70 06/24/2009  . Osteoarthritis, multiple sites 04/16/2009  . CAD (coronary artery disease) 02/04/2009  . Major depressive disorder, recurrent episode ([place]) 08/21/2008  . ALLERGIC RHINITIS 08/21/2008  . SLEEP APNEA 08/21/2008    [name]  PT 10/08/2019, 1:23 PM  [facility] [address] [place], [place], [address] Phone: [phone]   Fax:  [phone]  Name: Jaidev Sudberry MRN: [id number] Date of Birth: [birth year]

## 2019-10-08 NOTE — Patient Instructions (Signed)
Knee to chest, LTR, PPT   5-10 reps 5 sec hold 1-2x/day   RT/LT

## 2019-10-15 ENCOUNTER — Ambulatory Visit: Payer: Medicare Other | Admitting: Podiatry

## 2019-10-15 ENCOUNTER — Ambulatory Visit (INDEPENDENT_AMBULATORY_CARE_PROVIDER_SITE_OTHER): Payer: Medicare Other

## 2019-10-15 ENCOUNTER — Other Ambulatory Visit: Payer: Self-pay

## 2019-10-15 ENCOUNTER — Encounter: Payer: Self-pay | Admitting: Podiatry

## 2019-10-15 DIAGNOSIS — M779 Enthesopathy, unspecified: Secondary | ICD-10-CM

## 2019-10-16 ENCOUNTER — Ambulatory Visit: Payer: Medicare Other

## 2019-10-16 DIAGNOSIS — M48061 Spinal stenosis, lumbar region without neurogenic claudication: Secondary | ICD-10-CM

## 2019-10-16 DIAGNOSIS — M62830 Muscle spasm of back: Secondary | ICD-10-CM

## 2019-10-16 DIAGNOSIS — R293 Abnormal posture: Secondary | ICD-10-CM | POA: Diagnosis not present

## 2019-10-16 NOTE — Therapy (Addendum)
[place], [place], [address] Phone: [phone]   Fax:  [phone]  Physical Therapy Treatment  Patient Details  Name: David [name] MRN: [id number] Date of Birth: Aug 20, 1951 Referring Provider (PT): Jean Rosenthal, MD    Encounter Date: 10/16/2019  PT End of Session - 10/16/19 1043    Visit Number  2    Number of Visits  12    Date for PT Re-Evaluation  11/16/19    Authorization Type  BCBS  MCR    Authorization Time Period  progress at visit 10  KX at visit 15    PT Start Time  [id number]    PT Stop Time  1130    PT Time Calculation (min)  46 min    Activity Tolerance  Patient tolerated treatment well    Behavior During Therapy  [facility] for tasks assessed/performed       Past Medical History:  Diagnosis Date  . Allergy   . Arthritis   . CAD (coronary artery disease)    s/p bifurcation stenting (DES x2) and stenting bifurcation lesion distal RCA (1 DES)  . Cataract   . Depression   . Diabetes mellitus   . GERD (gastroesophageal reflux disease)   . Hyperlipemia   . Hypertension   . Myocardial infarction ([place])   . Sleep apnea    CPAP    Past Surgical History:  Procedure Laterality Date  . BIOPSY  09/01/2018   Procedure: BIOPSY;  Surgeon: Mauri Pole, MD;  Location: [place];  Service: Endoscopy;;  . CATARACT EXTRACTION Bilateral   . CORONARY ANGIOPLASTY WITH STENT PLACEMENT   March 2010   Dr. Olevia Perches  . ESOPHAGOGASTRODUODENOSCOPY (EGD) WITH PROPOFOL N/A 09/01/2018   Procedure: ESOPHAGOGASTRODUODENOSCOPY (EGD) WITH PROPOFOL;  Surgeon: Mauri Pole, MD;  Location: [place] [facility];  Service: Endoscopy;  Laterality: N/A;  . LEFT HEART CATH AND CORONARY ANGIOGRAPHY N/A 08/31/2018   Procedure: LEFT HEART CATH AND CORONARY ANGIOGRAPHY;  Surgeon: Sherren Mocha, MD;  Location: [place] [facility];  Service: Cardiovascular;  Laterality: N/A;  . TONSILLECTOMY  1959    There were no  vitals filed for this visit.  Subjective Assessment - 10/16/19 1131    Subjective  No changes still pain post work. LTR gives the most pull with stretching    Currently in Pain?  Yes    Pain Score  2     Pain Location  Back    Pain Descriptors / Indicators  Aching    Pain Type  Chronic pain    Pain Radiating Towards  buttocks    Pain Onset  More than a month ago    Pain Frequency  Intermittent    Aggravating Factors   bend and stooping    Pain Relieving Factors  rest                       [facility] Adult PT Treatment/Exercise - 10/16/19 0001      Exercises   Exercises  Lumbar      Lumbar Exercises: Stretches   Single Knee to Chest Stretch  Right;Left;2 reps;30 seconds    Lower Trunk Rotation  5 reps;10 seconds    Lower Trunk Rotation Limitations  RT and LT     Pelvic Tilt  10 reps;5 seconds      Lumbar Exercises: Aerobic   Nustep  L4 5 min LE       Modalities   Modalities  Moist  Heat;Electrical Stimulation      Moist Heat Therapy   Number Minutes Moist Heat  15 Minutes    Moist Heat Location  Lumbar Spine      Electrical Stimulation   Electrical Stimulation Location  lower back    Electrical Stimulation Action  IFC    Electrical Stimulation Parameters  to tolerance 2 channels crossed    Electrical Stimulation Goals  Pain      Manual Therapy   Manual Therapy  Soft tissue mobilization;Joint mobilization;Manual Traction;Myofascial release    Joint Mobilization  PA Gr 2 to thoracic and lumbar spines    Soft tissue mobilization  to paraspinalsand QL bilaterally    Myofascial Release  gluteals with hip rotation    Manual Traction  long axis leg pulls gr3              PT Education - 10/16/19 1128    Education Details  part sit up pilates ab prep10-20 reps daily    Person(s) Educated  Patient    Methods  Explanation;Tactile cues;Verbal cues;Handout    Comprehension  Returned demonstration;Verbalized understanding       PT Short Term Goals -  10/08/19 1318      PT SHORT TERM GOAL #1   Title  He will be indpenent with initial hEP issued    Time  3    Period  Weeks    Status  New      PT SHORT TERM GOAL #2   Title  He will report 20% decrease pain with work activity.    Time  3    Period  Weeks    Status  New        PT Long Term Goals - 10/08/19 1319      PT LONG TERM GOAL #1   Title  He will be indpendent with all hEP issued.    Time  6    Period  Weeks    Status  New      PT LONG TERM GOAL #2   Title  He will report pain decreased 50% or more in back and hips  with work.    Time  6    Period  Weeks    Status  New      PT LONG TERM GOAL #3   Title  FOTO score with improve to  36 % limited    Time  6    Period  Weeks    Status  New            Plan - 10/16/19 1044    Clinical Impression Statement  No changes with pain . tilerated all exercises without incr pain. Tolerated manual well . Pain eased with HMP    PT Treatment/Interventions  Traction;Moist Heat;Electrical Stimulation;Functional mobility training;Therapeutic activities;Therapeutic exercise;Patient/family education;Manual techniques;Taping;Passive range of motion;Dry needling    PT Next Visit Plan  continue stretching , manual to lower back , core strength, heat/ES   ? traction    PT Home Exercise Plan  knee to chest,  LTR,  piriformis stretches, PPT, Pilates ab prep    Consulted and Agree with Plan of Care  Patient       Patient will benefit from skilled therapeutic intervention in order to improve the following deficits and impairments:  Pain, Decreased activity tolerance, Postural dysfunction, Increased muscle spasms, Decreased range of motion  Visit Diagnosis: Spinal stenosis of lumbar region without neurogenic claudication  Abnormal posture  Muscle spasm of back  Problem List Patient Active Problem List   Diagnosis Date Noted  . Hip pain 08/07/2019  . History of esophageal varices 09/15/2018  . Alcoholic cirrhosis of  liver without ascites ([place])   . Secondary esophageal varices without bleeding ([place])   . Anemia 08/31/2018  . Overweight 09/13/2017  . Poorly controlled type 2 diabetes mellitus with circulatory disorder ([place]) 01/18/2017  . Medial epicondylitis of elbow, left 08/03/2016  . Elevated liver function tests 07/13/2016  . Thrombocytopenia ([place]) 07/13/2016  . Trigger middle finger of left hand 03/02/2016  . Bilateral hand pain 12/02/2015  . Primary osteoarthritis of both first carpometacarpal joints 12/02/2015  . Advance directive discussed with patient 05/16/2015  . Routine general medical examination at a health care facility 02/16/2011  . Erectile dysfunction 02/16/2011  . Essential hypertension 12/21/2010  . Hyperlipidemia LDL goal <70 06/24/2009  . Osteoarthritis, multiple sites 04/16/2009  . CAD (coronary artery disease) 02/04/2009  . Major depressive disorder, recurrent episode ([place]) 08/21/2008  . ALLERGIC RHINITIS 08/21/2008  . SLEEP APNEA 08/21/2008    [name]  PT 10/16/2019, 11:52 AM  [facility] [address] [place], [place], [address] Phone: [phone]   Fax:  [phone]  Name: David [name] MRN: [id number] Date of Birth: [birth year]

## 2019-10-17 NOTE — Progress Notes (Signed)
Subjective:   Patient ID: David [name], male   DOB: 68 y.o.   MRN: [id number]   HPI Patient presents stating he has been doing well but has developed a flare in the dorsal lateral aspect of the right foot that he states is tender and he wants it treated   ROS      Objective:  Physical Exam  Neurovascular status intact with inflammation pain of the dorsal lateral right foot around the peroneal tertius complex and extensor tendon complex     Assessment:  Acute tendinitis dorsal lateral aspect right foot     Plan:  Reviewed condition and recommended injection treatment and went ahead today and did sterile prep and injected the dorsal tendon complex 3 mg Kenalog 5 mg Xylocaine advised on continued supportive therapy and reappoint to recheck

## 2019-10-20 ENCOUNTER — Other Ambulatory Visit: Payer: Self-pay | Admitting: Cardiovascular Disease

## 2019-10-22 ENCOUNTER — Encounter: Payer: Self-pay | Admitting: Internal Medicine

## 2019-10-22 ENCOUNTER — Telehealth: Payer: Self-pay

## 2019-10-22 ENCOUNTER — Ambulatory Visit: Payer: Medicare Other

## 2019-10-22 ENCOUNTER — Other Ambulatory Visit: Payer: Self-pay | Admitting: Internal Medicine

## 2019-10-22 ENCOUNTER — Other Ambulatory Visit: Payer: Self-pay

## 2019-10-22 DIAGNOSIS — R293 Abnormal posture: Secondary | ICD-10-CM | POA: Diagnosis not present

## 2019-10-22 DIAGNOSIS — M48061 Spinal stenosis, lumbar region without neurogenic claudication: Secondary | ICD-10-CM | POA: Diagnosis not present

## 2019-10-22 DIAGNOSIS — M62830 Muscle spasm of back: Secondary | ICD-10-CM | POA: Diagnosis not present

## 2019-10-22 DIAGNOSIS — E1165 Type 2 diabetes mellitus with hyperglycemia: Secondary | ICD-10-CM

## 2019-10-22 DIAGNOSIS — E1159 Type 2 diabetes mellitus with other circulatory complications: Secondary | ICD-10-CM

## 2019-10-22 NOTE — Therapy (Signed)
[place], [place], [address] Phone: [phone]   Fax:  [phone]  Physical Therapy Treatment  Patient Details  Name: David [name] MRN: [id number] Date of Birth: [birth year] Referring Provider (PT): Jean Rosenthal, MD    Encounter Date: 10/22/2019  PT End of Session - 10/22/19 1339    Visit Number  3    Number of Visits  12    Date for PT Re-Evaluation  11/16/19    Authorization Type  BCBS  MCR    Authorization Time Period  progress at visit 10  KX at visit 15    PT Start Time  1045    PT Stop Time  1130    PT Time Calculation (min)  45 min    Activity Tolerance  Patient tolerated treatment well    Behavior During Therapy  [facility] for tasks assessed/performed       Past Medical History:  Diagnosis Date  . Allergy   . Arthritis   . CAD (coronary artery disease)    s/p bifurcation stenting (DES x2) and stenting bifurcation lesion distal RCA (1 DES)  . Cataract   . Depression   . Diabetes mellitus   . GERD (gastroesophageal reflux disease)   . Hyperlipemia   . Hypertension   . Myocardial infarction ([place])   . Sleep apnea    CPAP    Past Surgical History:  Procedure Laterality Date  . BIOPSY  09/01/2018   Procedure: BIOPSY;  Surgeon: Mauri Pole, MD;  Location: [place];  Service: Endoscopy;;  . CATARACT EXTRACTION Bilateral   . CORONARY ANGIOPLASTY WITH STENT PLACEMENT   March 2010   Dr. Olevia Perches  . ESOPHAGOGASTRODUODENOSCOPY (EGD) WITH PROPOFOL N/A 09/01/2018   Procedure: ESOPHAGOGASTRODUODENOSCOPY (EGD) WITH PROPOFOL;  Surgeon: Mauri Pole, MD;  Location: [place] [facility];  Service: Endoscopy;  Laterality: N/A;  . LEFT HEART CATH AND CORONARY ANGIOGRAPHY N/A 08/31/2018   Procedure: LEFT HEART CATH AND CORONARY ANGIOGRAPHY;  Surgeon: Sherren Mocha, MD;  Location: [place] [facility];  Service: Cardiovascular;  Laterality: N/A;  . TONSILLECTOMY  1959    There were no  vitals filed for this visit.  Subjective Assessment - 10/22/19 1051    Subjective  RT buttock pain mild but feeling better generally.    Pain Score  2     Pain Location  Buttocks    Pain Orientation  Right    Pain Descriptors / Indicators  Aching    Pain Type  Chronic pain    Pain Onset  More than a month ago    Pain Frequency  Intermittent                       [facility] Adult PT Treatment/Exercise - 10/22/19 0001      Lumbar Exercises: Stretches   Single Knee to Chest Stretch  Right;Left;2 reps;30 seconds    Lower Trunk Rotation  2 reps    Lower Trunk Rotation Limitations  RT and LT     Hip Flexor Stretch  30 seconds    Pelvic Tilt  10 reps;10 seconds      Lumbar Exercises: Supine   Pelvic Tilt  10 reps    Pelvic Tilt Limitations  with pelvis lift attempting to lift vertically      Modalities   Modalities  Ultrasound      Moist Heat Therapy   Number Minutes Moist Heat  --    Moist Heat Location  --  Clinical cytogeneticist Parameters  --    Printmaker Goals  --      Ultrasound   Ultrasound Location  RT SI/piriformis area    Ultrasound Parameters  100% 1.7Wcm2, 1MHZ    Ultrasound Goals  Pain      Manual Therapy   Joint Mobilization  PA Gr 2 to thoracic and lumbar spines    Soft tissue mobilization  to paraspinalsand QL bilaterally    Myofascial Release  gluteals with hip rotation    Manual Traction  long axis leg pulls gr3                PT Short Term Goals - 10/22/19 1133      PT SHORT TERM GOAL #1   Title  He will be indpenent with initial hEP issued    Status  Achieved      PT SHORT TERM GOAL #2   Title  He will report 20% decrease pain with work activity.    Status  On-going        PT Long Term Goals - 10/08/19 1319      PT LONG TERM GOAL #1   Title  He will be indpendent with all hEP issued.    Time  6     Period  Weeks    Status  New      PT LONG TERM GOAL #2   Title  He will report pain decreased 50% or more in back and hips  with work.    Time  6    Period  Weeks    Status  New      PT LONG TERM GOAL #3   Title  FOTO score with improve to  36 % limited    Time  6    Period  Weeks    Status  New            Plan - 10/22/19 1132    Clinical Impression Statement  He reports feeling good at end.  Discused use of stool for work to decrease dthe amout of bending with full body load to back and hip. He said he would look into this    PT Treatment/Interventions  Traction;Moist Heat;Electrical Stimulation;Functional mobility training;Therapeutic activities;Therapeutic exercise;Patient/family education;Manual techniques;Taping;Passive range of motion;Dry needling    PT Next Visit Plan  continue stretching , manual to lower back , core strength, heat/ES   ? traction    PT Home Exercise Plan  knee to chest,  LTR,  piriformis stretches, PPT, Pilates ab prep    Consulted and Agree with Plan of Care  Patient       Patient will benefit from skilled therapeutic intervention in order to improve the following deficits and impairments:  Pain, Decreased activity tolerance, Postural dysfunction, Increased muscle spasms, Decreased range of motion  Visit Diagnosis: Spinal stenosis of lumbar region without neurogenic claudication  Abnormal posture  Muscle spasm of back     Problem List Patient Active Problem List   Diagnosis Date Noted  . Hip pain 08/07/2019  . History of esophageal varices 09/15/2018  . Alcoholic cirrhosis of liver without ascites ([place])   . Secondary esophageal varices without bleeding ([place])   . Anemia 08/31/2018  . Overweight 09/13/2017  . Poorly controlled type 2 diabetes mellitus with circulatory disorder ([place]) 01/18/2017  . Medial epicondylitis of elbow, left 08/03/2016  . Elevated  liver function tests 07/13/2016  . Thrombocytopenia ([place]) 07/13/2016  . Trigger  middle finger of left hand 03/02/2016  . Bilateral hand pain 12/02/2015  . Primary osteoarthritis of both first carpometacarpal joints 12/02/2015  . Advance directive discussed with patient 05/16/2015  . Routine general medical examination at a health care facility 02/16/2011  . Erectile dysfunction 02/16/2011  . Essential hypertension 12/21/2010  . Hyperlipidemia LDL goal <70 06/24/2009  . Osteoarthritis, multiple sites 04/16/2009  . CAD (coronary artery disease) 02/04/2009  . Major depressive disorder, recurrent episode ([place]) 08/21/2008  . ALLERGIC RHINITIS 08/21/2008  . SLEEP APNEA 08/21/2008    [name] PT 10/22/2019, 1:39 PM  [facility] [address] [place], [place], [address] Phone: [phone]   Fax:  [phone]  Name: David [name] MRN: [id number] Date of Birth: 01-25-1951

## 2019-10-22 NOTE — Telephone Encounter (Signed)
This medication was prescribed by another provider. Would Dr. Burt Knack like to refill this medication? Please address

## 2019-10-22 NOTE — Telephone Encounter (Signed)
Received refill request for Coreg 3.125 mg BID for patient. At the last OV, the patient was taking propranolol 10 mg TID. Confirmed with the patient he is NOT taking both medications. Reviewed medication list in detail. Updates made and med list now correct. He does not regularly check his BP or HR, but when he goes to the drug store his BP runs 120-150/70-80 and HR in the 70s.   Scheduled the patient for overdue visit tomorrow to discuss medications and call in appropriate meds.   The patient was grateful for call and agrees with treatment plan.  Consent obtained for video visit. He is prepared. Allergies and medications reviewed.   Virtual Visit Pre-Appointment Phone Call Confirm consent - "In the setting of the current Covid19 crisis, you are scheduled for a (phone or video) visit with your provider on (date) at (time).  Just as we do with many in-office visits, in order for you to participate in this visit, we must obtain consent.  If you'd like, I can send this to your mychart (if signed up) or email for you to review.  Otherwise, I can obtain your verbal consent now.  All virtual visits are billed to your insurance company just like a normal visit would be.  By agreeing to a virtual visit, we'd like you to understand that the technology does not allow for your provider to perform an examination, and thus may limit your provider's ability to fully assess your condition. If your provider identifies any concerns that need to be evaluated in person, we will make arrangements to do so.  Finally, though the technology is pretty good, we cannot assure that it will always work on either your or our end, and in the setting of a video visit, we may have to convert it to a phone-only visit.  In either situation, we cannot ensure that we have a secure connection.  Are you willing to proceed?" STAFF: Did the patient verbally acknowledge consent to telehealth visit? Document YES/NO here: YES  TELEPHONE CALL  NOTE  David [name] has been deemed a candidate for a follow-up tele-health visit to limit community exposure during the Covid-19 pandemic. I spoke with the patient via phone to ensure availability of phone/video source, confirm preferred email & phone number, and discuss instructions and expectations.  I reminded David [name] to be prepared with any vital sign and/or heart rhythm information that could potentially be obtained via home monitoring, at the time of his visit. I reminded David [name] to expect a phone call prior to his visit.

## 2019-10-23 ENCOUNTER — Ambulatory Visit: Payer: Medicare Other

## 2019-10-23 ENCOUNTER — Other Ambulatory Visit: Payer: Self-pay | Admitting: Internal Medicine

## 2019-10-23 ENCOUNTER — Telehealth (INDEPENDENT_AMBULATORY_CARE_PROVIDER_SITE_OTHER): Payer: Medicare Other | Admitting: Cardiology

## 2019-10-23 VITALS — Ht 69.0 in | Wt 190.0 lb

## 2019-10-23 DIAGNOSIS — I2510 Atherosclerotic heart disease of native coronary artery without angina pectoris: Secondary | ICD-10-CM

## 2019-10-23 DIAGNOSIS — E119 Type 2 diabetes mellitus without complications: Secondary | ICD-10-CM | POA: Diagnosis not present

## 2019-10-23 DIAGNOSIS — K259 Gastric ulcer, unspecified as acute or chronic, without hemorrhage or perforation: Secondary | ICD-10-CM

## 2019-10-23 DIAGNOSIS — I10 Essential (primary) hypertension: Secondary | ICD-10-CM | POA: Diagnosis not present

## 2019-10-23 DIAGNOSIS — Z87891 Personal history of nicotine dependence: Secondary | ICD-10-CM

## 2019-10-23 DIAGNOSIS — I8510 Secondary esophageal varices without bleeding: Secondary | ICD-10-CM

## 2019-10-23 DIAGNOSIS — E785 Hyperlipidemia, unspecified: Secondary | ICD-10-CM | POA: Diagnosis not present

## 2019-10-23 DIAGNOSIS — F1010 Alcohol abuse, uncomplicated: Secondary | ICD-10-CM

## 2019-10-23 DIAGNOSIS — K7030 Alcoholic cirrhosis of liver without ascites: Secondary | ICD-10-CM

## 2019-10-23 DIAGNOSIS — Z955 Presence of coronary angioplasty implant and graft: Secondary | ICD-10-CM

## 2019-10-23 MED ORDER — INSULIN NPH (HUMAN) (ISOPHANE) 100 UNIT/ML ~~LOC~~ SUSP: SUBCUTANEOUS | 11 refills | Status: DC

## 2019-10-23 MED ORDER — "RELION INSULIN SYRINGE 31G X 15/64"" 0.5 ML MISC": 3 refills | Status: DC

## 2019-10-23 MED ORDER — PROPRANOLOL HCL 20 MG PO TABS: 20.0000 mg | ORAL_TABLET | Freq: Two times a day (BID) | ORAL | 1 refills | Status: DC

## 2019-10-23 NOTE — Progress Notes (Signed)
Virtual Visit via Telephone Note   This visit type was conducted due to national recommendations for restrictions regarding the COVID-19 Pandemic (e.g. social distancing) in an effort to limit this patient's exposure and mitigate transmission in our community.  Due to his co-morbid illnesses, this patient is at least at moderate risk for complications without adequate follow up.  This format is felt to be most appropriate for this patient at this time.  The patient did not have access to video technology/had technical difficulties with video requiring transitioning to audio format only (telephone).  All issues noted in this document were discussed and addressed.  No physical exam could be performed with this format.  Please refer to the patient's chart for his  consent to telehealth for [facility].   Date:  10/23/2019   ID:  David [name], DOB August 04, 1951, MRN [id number]  Patient Location: Home Provider Location: Home  PCP:  [name], MD  Cardiologist:  Sherren Mocha, MD  Electrophysiologist:  None   Evaluation Performed:  Follow-Up Visit  Chief Complaint: Overdue follow-up, seen for Dr. Burt Knack  History of Present Illness:    David [name] is a 68 y.o. male with CAD s/p complex 2 vessel PCI in 2010, hypertension, diabetes, hyperlipidemia and alcohol abuse. Seen in clinic 2019 for shortness of breath and L sided chest pain in which a nuclear stress test was performed and was noted to be markedly abnormal with chest pain in stage 1 and 1-2 mm horizontal inferolateral ST depression. He was admitted for [facility]. Unfortunately, his hemoglobin was 5.6 on admission.  An abdominal ultrasound suggested cirrhosis.  An EGD demonstrated Gr 2 esophageal varices, nonbleeding gastric ulcers and erythematous duodenopathy.  He was asked to discontinue NSAID use and was transfused 3 units of PRBCs.  Metoprolol was changed to Nadolol upon recommendations by GI.  Cath 08/31/2018 demonstrated patent stents  in the LCx and RCA with mild restenosis. Medical Rx was recommended for his coronary artery disease.     He was last seen on 09/25/2018 for post hospital follow-up by Richardson Dopp, PA-C.  He then called our office on 10/22/2019 with refill request for carvedilol 3.125 mg twice daily.  Per chart review, at last office visit the patient was taking propanolol 10 mg 3 times daily. Patient confirmed during telephone encounter that he was no longer taking propanolol and had since been prescribed carvedilol 3.125 mg but was not taking both beta-blocker therapies.    Since he was last seen he has been doing very well from a cardiac perspective.  Denies chest pain, shortness of breath, LE swelling, orthopnea, dizziness, diaphoresis, presyncopal or syncopal episodes.  He has been staying active without cardiac complaints.  Has been relatively lost to follow-up and therefore was asked to be seen prior to prescription refill.  Given his history of significant esophageal varices, discussed carvedilol versus propanolol therapies with Pharm.D. who recommends restarting propanolol therapy to help prevent recurrent varices.  Patient states he has not had any further issues with this. Follows by Dr. Silvio Pate for PCP care.  Has also been following with endocrinology for DM2 care. They are currently adjusting his therapies for more acceptable blood sugar/hemoglobin A1c levels. Discussed the reason for carvedilol versus propanolol and patient understands and agrees.  Has no other specific complaints today.  Has been staying safe from Covid including masking, handwashing and social distancing.  The patient does not have symptoms concerning for COVID-19 infection (fever, chills, cough, or new shortness of breath).  Past Medical History:  Diagnosis Date   Allergy    Arthritis    CAD (coronary artery disease)    s/p bifurcation stenting (DES x2) and stenting bifurcation lesion distal RCA (1 DES)   Cataract     Depression    Diabetes mellitus    GERD (gastroesophageal reflux disease)    Hyperlipemia    Hypertension    Myocardial infarction [facility])    Sleep apnea    CPAP   Past Surgical History:  Procedure Laterality Date   BIOPSY  09/01/2018   Procedure: BIOPSY;  Surgeon: Mauri Pole, MD;  Location: [place];  Service: Endoscopy;;   CATARACT EXTRACTION Bilateral    CORONARY ANGIOPLASTY WITH STENT PLACEMENT   March 2010   Dr. Olevia Perches   ESOPHAGOGASTRODUODENOSCOPY (EGD) WITH PROPOFOL N/A 09/01/2018   Procedure: ESOPHAGOGASTRODUODENOSCOPY (EGD) WITH PROPOFOL;  Surgeon: Mauri Pole, MD;  Location[facility];  Service: Endoscopy;  Laterality: N/A;   LEFT HEART CATH AND CORONARY ANGIOGRAPHY N/A 08/31/2018   Procedure: LEFT HEART CATH AND CORONARY ANGIOGRAPHY;  Surgeon: Sherren Mocha, MD;  Location: [place] CV [facility];  Service: Cardiovascular;  Laterality: N/A;   TONSILLECTOMY  1959     No outpatient medications have been marked as taking for the 10/23/19 encounter (Appointment) with Tommie Raymond, NP.     Allergies:   Venlafaxine   Social History   Tobacco Use   Smoking status: Former Smoker    Types: Cigarettes    Quit date: 11/30/1983    Years since quitting: 35.9   Smokeless tobacco: Never Used  Substance Use Topics   Alcohol use: Yes    Alcohol/week: 20.0 standard drinks    Types: 20 Standard drinks or equivalent per week    Comment: beer   Drug use: No    Family Hx: The patient's family history includes Alcohol abuse in his mother; Cancer in his brother and father; Diabetes in his father; Heart failure in his father and mother; Schizophrenia in his son; Uveitis in his son. There is no history of Colon cancer, Esophageal cancer, Stomach cancer, Liver cancer, Pancreatic cancer, or Rectal cancer.  ROS:   Please see the history of present illness.     All other systems reviewed and are negative.  Prior CV studies:   The following studies  were reviewed today:  Cardiac Catheterization 08/31/18 LM irregs LAD mild diffuse disease LCx prox stent patent, mid stent patent with 10 ISR; OM2 stent patent with 25 ISR RCA prox 40; RPDA ost stent patent with 40 ISR; RPAVB 50 1. Patent stents in the left circumflex with mild restenosis 2. Patent stents in the RCA with mild in stent restenosis 3. Widely patent left main and LAD 4. Mildly elevated LVEDP Stable coronary anatomy without high-grade coronary stenosis. Medical therapy for CAD. OK to proceed with GI evaluation as needed.   Echo 08/28/18 EF 60-65, no RWMA, mild dilation of ascending aorta (40 mm), trivial MR, mod LAE, mild RAE, trivial TR, PASP 32  Nuclear stress test 08/28/18 Normal perfusion images. LVEF 64% with normal wall motion. However, poor exercise tolerance with chest pain, pallor and 3 mm lateral ST segment depression at exercise. Clinical findings suggestive of ischemia. Clinical correlation is strongly advised.  Labs/Other Tests and Data Reviewed:    EKG:  No ECG reviewed.  Recent Labs: 11/27/2018: Hemoglobin 9.4; Platelets 117.0   Recent Lipid Panel Lab Results  Component Value Date/Time   CHOL 143 11/27/2018 12:38 PM   TRIG 91.0  11/27/2018 12:38 PM   HDL 60.90 11/27/2018 12:38 PM   CHOLHDL 2 11/27/2018 12:38 PM   LDLCALC 64 11/27/2018 12:38 PM   LDLDIRECT 103.9 11/05/2013 12:49 PM    Wt Readings from Last 3 Encounters:  09/17/19 190 lb (86.2 kg)  08/07/19 184 lb 12.8 oz (83.8 kg)  06/11/19 184 lb (83.5 kg)     Objective:    Vital Signs:  There were no vitals taken for this visit.   VITAL SIGNS:  reviewed GEN:  no acute distress NEURO:  alert and oriented x 3, no obvious focal deficit PSYCH:  normal affect  ASSESSMENT & PLAN:    1. Coronary artery disease without angina pectoris: -Patient with a history of complex two-vessel PCI in 2010 and most recent LHC with patent stenting in the LCx and RCA 08/2018>> thought likely to be having  symptoms secondary to profound anemia found to have esophageal varices during hospitalization>> initially placed on nadolol per GI request which was then transitioned to propanolol secondary to insurance coverage.  It is unclear when carvedilol was added to his regimen however in discussion with Pharm.D. and in the absence of LV dysfunction, will restart propanolol at this time -Denies anginal symptoms  -Continue ASA, beta-blocker, statin.   2. Essential hypertension: -Unable to obtain home BP however patient states SBP is typically run in the 130 ~140 range -Continue current therapy.    3. Hyperlipidemia with a LDL goal <70: -Continue statin Rx -Labs per PCP in 1 month during routine follow-up  4. Alcoholic cirrhosis/Gastric ulcer: -Evidence of esophageal varices on EGD 08/2018 -Continue to avoid NSAIDs -BB therapy with Propranolol   COVID-19 Education: The signs and symptoms of COVID-19 were discussed with the patient and how to seek care for testing (follow up with PCP or arrange E-visit).  The importance of social distancing was discussed today.  Time:   Today, I have spent 20 minutes with the patient with telehealth technology discussing the above problems.     Medication Adjustments/Labs and Tests Ordered: Current medicines are reviewed at length with the patient today.  Concerns regarding medicines are outlined above.   Tests Ordered: No orders of the defined types were placed in this encounter.   Medication Changes: No orders of the defined types were placed in this encounter.   Follow Up:  In Person With Dr. Burt Knack in 4 to 6 months  Signed, Kathyrn Drown, NP  10/23/2019 11:41 AM    [place]

## 2019-10-23 NOTE — Patient Instructions (Signed)
Medication Instructions:   Your physician has recommended you make the following change in your medication:   1) Stop Carvedilol 3.125MG  2) Re-start Propranolol 20MG , 1 tablet by mouth twice a day  *If you need a refill on your cardiac medications before your next appointment, please call your pharmacy*  Lab Work:  To be drawn by primary care physician in January  Testing/Procedures:  None ordered today  Follow-Up: At [facility], you and your health needs are our priority.  As part of our continuing mission to provide you with exceptional heart care, we have created designated Provider Care Teams.  These Care Teams include your primary Cardiologist (physician) and Advanced Practice Providers (APPs -  Physician Assistants and Nurse Practitioners) who all work together to provide you with the care you need, when you need it.  Your next appointment:   6 month(s)  The format for your next appointment:   In Person  Provider:   You may see Sherren Mocha, MD or one of the following Advanced Practice Providers on your designated Care Team:    Richardson Dopp, PA-C  Vin Gerster, [place]  Daune Perch, [place]

## 2019-10-28 ENCOUNTER — Encounter: Payer: Self-pay | Admitting: Internal Medicine

## 2019-10-29 ENCOUNTER — Ambulatory Visit: Payer: Medicare Other

## 2019-10-29 ENCOUNTER — Other Ambulatory Visit: Payer: Self-pay

## 2019-10-29 DIAGNOSIS — M48061 Spinal stenosis, lumbar region without neurogenic claudication: Secondary | ICD-10-CM

## 2019-10-29 DIAGNOSIS — R293 Abnormal posture: Secondary | ICD-10-CM

## 2019-10-29 DIAGNOSIS — M62830 Muscle spasm of back: Secondary | ICD-10-CM | POA: Diagnosis not present

## 2019-10-29 NOTE — Therapy (Signed)
[place], [place], [address] Phone: [phone]   Fax:  [phone]  Physical Therapy Treatment  Patient Details  Name: David [name] MRN: [id number] Date of Birth: March 26, 1951 Referring Provider (PT): Jean Rosenthal, MD    Encounter Date: 10/29/2019  PT End of Session - 10/29/19 1129    Visit Number  4    Number of Visits  12    Date for PT Re-Evaluation  11/16/19    Authorization Type  BCBS  MCR    Authorization Time Period  progress at visit 10  KX at visit 15    PT Start Time  1130    PT Stop Time  1215    PT Time Calculation (min)  45 min    Activity Tolerance  Patient tolerated treatment well    Behavior During Therapy  [facility] for tasks assessed/performed       Past Medical History:  Diagnosis Date  . Allergy   . Arthritis   . CAD (coronary artery disease)    s/p bifurcation stenting (DES x2) and stenting bifurcation lesion distal RCA (1 DES)  . Cataract   . Depression   . Diabetes mellitus   . GERD (gastroesophageal reflux disease)   . Hyperlipemia   . Hypertension   . Myocardial infarction ([place])   . Sleep apnea    CPAP    Past Surgical History:  Procedure Laterality Date  . BIOPSY  09/01/2018   Procedure: BIOPSY;  Surgeon: Mauri Pole, MD;  Location: [place];  Service: Endoscopy;;  . CATARACT EXTRACTION Bilateral   . CORONARY ANGIOPLASTY WITH STENT PLACEMENT   March 2010   Dr. Olevia Perches  . ESOPHAGOGASTRODUODENOSCOPY (EGD) WITH PROPOFOL N/A 09/01/2018   Procedure: ESOPHAGOGASTRODUODENOSCOPY (EGD) WITH PROPOFOL;  Surgeon: Mauri Pole, MD;  Location: [place] ENDOS[facility];  Service: Endoscopy;  Laterality: N/A;  . LEFT HEART CATH AND CORONARY ANGIOGRAPHY N/A 08/31/2018   Procedure: LEFT HEART CATH AND CORONARY ANGIOGRAPHY;  Surgeon: Sherren Mocha, MD;  Location: [place] CV [facility];  Service: Cardiovascular;  Laterality: N/A;  . TONSILLECTOMY  1959    There were no  vitals filed for this visit.  Subjective Assessment - 10/29/19 1132    Subjective  RT buttock pain mild but feeling better generally.    Pain Score  2     Pain Location  Buttocks    Pain Orientation  Right    Pain Descriptors / Indicators  Aching    Pain Type  Chronic pain    Pain Onset  More than a month ago    Pain Frequency  Intermittent    Aggravating Factors   bending and stooping    Pain Relieving Factors  rest                       [facility] Adult PT Treatment/Exercise - 10/29/19 0001      Lumbar Exercises: Stretches   Single Knee to Chest Stretch  Right;Left;2 reps;30 seconds    Lower Trunk Rotation  2 reps    Lower Trunk Rotation Limitations  RT     Hip Flexor Stretch  30 seconds    Pelvic Tilt  10 reps;10 seconds      Modalities   Modalities  Traction      Traction   Type of Traction  Lumbar    Min (lbs)  20    Max (lbs)  60    Hold Time  45  Rest Time  15    Time  15      Manual Therapy   Joint Mobilization  PA Gr 2 -3  to thoracic and lumbar spines    Soft tissue mobilization  to paraspinalsand QL bilaterally    Manual Traction  long axis leg pulls gr3 RT               PT Short Term Goals - 10/22/19 1133      PT SHORT TERM GOAL #1   Title  He will be indpenent with initial hEP issued    Status  Achieved      PT SHORT TERM GOAL #2   Title  He will report 20% decrease pain with work activity.    Status  On-going        PT Long Term Goals - 10/08/19 1319      PT LONG TERM GOAL #1   Title  He will be indpendent with all hEP issued.    Time  6    Period  Weeks    Status  New      PT LONG TERM GOAL #2   Title  He will report pain decreased 50% or more in back and hips  with work.    Time  6    Period  Weeks    Status  New      PT LONG TERM GOAL #3   Title  FOTO score with improve to  36 % limited    Time  6    Period  Weeks    Status  New            Plan - 10/29/19 1129    Clinical Impression Statement   Contniues doing well but still incr pain with work  No increased pain post traction. He said his pain was less and felt pretty good.    PT Treatment/Interventions  Traction;Moist Heat;Electrical Stimulation;Functional mobility training;Therapeutic activities;Therapeutic exercise;Patient/family education;Manual techniques;Taping;Passive range of motion;Dry needling    PT Next Visit Plan  continue stretching , manual to lower back , core strength, heat/ES   ? traction  70-80 # or table unlocked    PT Home Exercise Plan  knee to chest,  LTR,  piriformis stretches, PPT, Pilates ab prep    Consulted and Agree with Plan of Care  Patient       Patient will benefit from skilled therapeutic intervention in order to improve the following deficits and impairments:  Pain, Decreased activity tolerance, Postural dysfunction, Increased muscle spasms, Decreased range of motion  Visit Diagnosis: Spinal stenosis of lumbar region without neurogenic claudication  Abnormal posture  Muscle spasm of back     Problem List Patient Active Problem List   Diagnosis Date Noted  . Hip pain 08/07/2019  . History of esophageal varices 09/15/2018  . Alcoholic cirrhosis of liver without ascites ([place])   . Secondary esophageal varices without bleeding ([place])   . Anemia 08/31/2018  . Overweight 09/13/2017  . Poorly controlled type 2 diabetes mellitus with circulatory disorder ([place]) 01/18/2017  . Medial epicondylitis of elbow, left 08/03/2016  . Elevated liver function tests 07/13/2016  . Thrombocytopenia ([place]) 07/13/2016  . Trigger middle finger of left hand 03/02/2016  . Bilateral hand pain 12/02/2015  . Primary osteoarthritis of both first carpometacarpal joints 12/02/2015  . Advance directive discussed with patient 05/16/2015  . Routine general medical examination at a health care facility 02/16/2011  . Erectile dysfunction 02/16/2011  . Essential hypertension 12/21/2010  .  Hyperlipidemia LDL goal <70  06/24/2009  . Osteoarthritis, multiple sites 04/16/2009  . CAD (coronary artery disease) 02/04/2009  . Major depressive disorder, recurrent episode ([place]) 08/21/2008  . ALLERGIC RHINITIS 08/21/2008  . SLEEP APNEA 08/21/2008    [name]  PT 10/29/2019, 12:20 PM  [facility] [address] [place], [place], [address] Phone: [phone]   Fax:  [phone]  Name: David [name] MRN: [id number] Date of Birth: 23-Jun-1951

## 2019-11-05 ENCOUNTER — Ambulatory Visit: Payer: Medicare Other

## 2019-11-09 ENCOUNTER — Other Ambulatory Visit: Payer: Self-pay | Admitting: Physician Assistant

## 2019-11-12 ENCOUNTER — Other Ambulatory Visit: Payer: Self-pay

## 2019-11-12 ENCOUNTER — Ambulatory Visit: Payer: Medicare Other | Attending: Orthopaedic Surgery

## 2019-11-12 DIAGNOSIS — R293 Abnormal posture: Secondary | ICD-10-CM

## 2019-11-12 DIAGNOSIS — M48061 Spinal stenosis, lumbar region without neurogenic claudication: Secondary | ICD-10-CM | POA: Diagnosis not present

## 2019-11-12 DIAGNOSIS — M62830 Muscle spasm of back: Secondary | ICD-10-CM | POA: Insufficient documentation

## 2019-11-12 NOTE — Therapy (Signed)
[place], [place], [address] Phone: [phone]   Fax:  [phone]  Physical Therapy Treatment  Patient Details  Name: David [name] MRN: [id number] Date of Birth: [birth year] Referring Provider (PT): Jean Rosenthal, MD    Encounter Date: 11/12/2019  PT End of Session - 11/12/19 1221    Visit Number  5    Number of Visits  12    Date for PT Re-Evaluation  11/16/19    Authorization Type  BCBS  MCR    Authorization Time Period  progress at visit 10  KX at visit 15    PT Start Time  1215    PT Stop Time  1257    PT Time Calculation (min)  42 min    Activity Tolerance  Patient tolerated treatment well    Behavior During Therapy  [facility] for tasks assessed/performed       Past Medical History:  Diagnosis Date  . Allergy   . Arthritis   . CAD (coronary artery disease)    s/p bifurcation stenting (DES x2) and stenting bifurcation lesion distal RCA (1 DES)  . Cataract   . Depression   . Diabetes mellitus   . GERD (gastroesophageal reflux disease)   . Hyperlipemia   . Hypertension   . Myocardial infarction ([place])   . Sleep apnea    CPAP    Past Surgical History:  Procedure Laterality Date  . BIOPSY  09/01/2018   Procedure: BIOPSY;  Surgeon: Mauri Pole, MD;  Location: [place];  Service: Endoscopy;;  . CATARACT EXTRACTION Bilateral   . CORONARY ANGIOPLASTY WITH STENT PLACEMENT   March 2010   Dr. Olevia Perches  . ESOPHAGOGASTRODUODENOSCOPY (EGD) WITH PROPOFOL N/A 09/01/2018   Procedure: ESOPHAGOGASTRODUODENOSCOPY (EGD) WITH PROPOFOL;  Surgeon: Mauri Pole, MD;  Location: [facility];  Service: Endoscopy;  Laterality: N/A;  . LEFT HEART CATH AND CORONARY ANGIOGRAPHY N/A 08/31/2018   Procedure: LEFT HEART CATH AND CORONARY ANGIOGRAPHY;  Surgeon: Sherren Mocha, MD;  Location: [place];  Service: Cardiovascular;  Laterality: N/A;  . TONSILLECTOMY  1959    There were no  vitals filed for this visit.  Subjective Assessment - 11/12/19 1220    Subjective  PAion mainly in lower back . Little buttock pain in past week.    Currently in Pain?  No/denies                       [facility] Adult PT Treatment/Exercise - 11/12/19 0001      Lumbar Exercises: Stretches   Single Knee to Chest Stretch  Right;Left;2 reps;30 seconds    Lower Trunk Rotation  2 reps    Lower Trunk Rotation Limitations  RT /LT    Pelvic Tilt  10 reps;10 seconds      Traction   Type of Traction  Lumbar    Min (lbs)  20    Max (lbs)  65    Hold Time  90    Rest Time  15    Time  15      Manual Therapy   Joint Mobilization  PA Gr -3-4  to thoracic and lumbar spines    Soft tissue mobilization  to paraspinalsand QL bilaterally               PT Short Term Goals - 11/12/19 1222      PT SHORT TERM GOAL #1   Title  He will be indpenent with  initial hEP issued    Status  Achieved      PT SHORT TERM GOAL #2   Title  He will report 20% decrease pain with work activity.    Baseline  mainly in buttock    Status  Achieved        PT Long Term Goals - 11/12/19 1222      PT LONG TERM GOAL #1   Title  He will be indpendent with all hEP issued.    Status  On-going      PT LONG TERM GOAL #2   Title  He will report pain decreased 50% or more in back and hips  with work.    Status  On-going      PT LONG TERM GOAL #3   Title  FOTO score with improve to  36 % limited    Status  On-going            Plan - 11/12/19 1221    Clinical Impression Statement  Work contnues to limit due to pain. He is able to do the work now with less buttock pain.  Overall he is better but painlimits and benefit is not sustained though less pain for 2-3 days posts PT session. He is still interested in MRI. Will finish the next 2-3 sessions and have him contact MD    PT Treatment/Interventions  Traction;Moist Heat;Electrical Stimulation;Functional mobility training;Therapeutic  activities;Therapeutic exercise;Patient/family education;Manual techniques;Taping;Passive range of motion;Dry needling    PT Next Visit Plan  continue stretching , manual to lower back , core strength, heat/ES   ? traction  70-80 # or table unlocked    PT Home Exercise Plan  knee to chest,  LTR,  piriformis stretches, PPT, Pilates ab prep    Consulted and Agree with Plan of Care  Patient       Patient will benefit from skilled therapeutic intervention in order to improve the following deficits and impairments:  Pain, Decreased activity tolerance, Postural dysfunction, Increased muscle spasms, Decreased range of motion  Visit Diagnosis: Spinal stenosis of lumbar region without neurogenic claudication  Abnormal posture  Muscle spasm of back     Problem List Patient Active Problem List   Diagnosis Date Noted  . Hip pain 08/07/2019  . History of esophageal varices 09/15/2018  . Alcoholic cirrhosis of liver without ascites ([place])   . Secondary esophageal varices without bleeding ([place])   . Anemia 08/31/2018  . Overweight 09/13/2017  . Poorly controlled type 2 diabetes mellitus with circulatory disorder ([place]) 01/18/2017  . Medial epicondylitis of elbow, left 08/03/2016  . Elevated liver function tests 07/13/2016  . Thrombocytopenia ([place]) 07/13/2016  . Trigger middle finger of left hand 03/02/2016  . Bilateral hand pain 12/02/2015  . Primary osteoarthritis of both first carpometacarpal joints 12/02/2015  . Advance directive discussed with patient 05/16/2015  . Routine general medical examination at a health care facility 02/16/2011  . Erectile dysfunction 02/16/2011  . Essential hypertension 12/21/2010  . Hyperlipidemia LDL goal <70 06/24/2009  . Osteoarthritis, multiple sites 04/16/2009  . CAD (coronary artery disease) 02/04/2009  . Major depressive disorder, recurrent episode ([place]) 08/21/2008  . ALLERGIC RHINITIS 08/21/2008  . SLEEP APNEA 08/21/2008    [name]   PT 11/12/2019, 12:58 PM  [facility] [address] [place], [place], [address] Phone: [phone]   Fax:  [phone]  Name: David [name] MRN: [id number] Date of Birth: 04-23-1951

## 2019-11-27 ENCOUNTER — Ambulatory Visit: Payer: Medicare Other

## 2019-11-27 ENCOUNTER — Other Ambulatory Visit: Payer: Self-pay

## 2019-11-27 DIAGNOSIS — M48061 Spinal stenosis, lumbar region without neurogenic claudication: Secondary | ICD-10-CM | POA: Diagnosis not present

## 2019-11-27 DIAGNOSIS — R293 Abnormal posture: Secondary | ICD-10-CM | POA: Diagnosis not present

## 2019-11-27 DIAGNOSIS — M62830 Muscle spasm of back: Secondary | ICD-10-CM | POA: Diagnosis not present

## 2019-11-27 NOTE — Therapy (Addendum)
[place], [place], [address] Phone: [phone]   Fax:  [phone]  Physical Therapy Treatment/Discharge  Patient Details  Name: David [name] [name] MRN: [id number] Date of Birth: [birth year] Referring Provider (PT): David Rosenthal, MD    Encounter Date: 11/27/2019  PT End of Session - 11/27/19 1220    Visit Number  6    Number of Visits  12    Date for PT Re-Evaluation  11/16/19    Authorization Type  BCBS  MCR    Authorization Time Period  progress at visit 10  KX at visit 15    PT Start Time  1215    PT Stop Time  1250    PT Time Calculation (min)  35 min    Activity Tolerance  Patient tolerated treatment well    Behavior During Therapy  [facility] for tasks assessed/performed       Past Medical History:  Diagnosis Date  . Allergy   . Arthritis   . CAD (coronary artery disease)    s/p bifurcation stenting (DES x2) and stenting bifurcation lesion distal RCA (1 DES)  . Cataract   . Depression   . Diabetes mellitus   . GERD (gastroesophageal reflux disease)   . Hyperlipemia   . Hypertension   . Myocardial infarction ([place])   . Sleep apnea    CPAP    Past Surgical History:  Procedure Laterality Date  . BIOPSY  09/01/2018   Procedure: BIOPSY;  Surgeon: David [name] Pole, MD;  Location: [facility];  Service: Endoscopy;;  . CATARACT EXTRACTION Bilateral   . CORONARY ANGIOPLASTY WITH STENT PLACEMENT   March 2010   Dr. Olevia [name]  . ESOPHAGOGASTRODUODENOSCOPY (EGD) WITH PROPOFOL N/A 09/01/2018   Procedure: ESOPHAGOGASTRODUODENOSCOPY (EGD) WITH PROPOFOL;  Surgeon: David [name] Pole, MD;  Location: [facility];  Service: Endoscopy;  Laterality: N/A;  . LEFT HEART CATH AND CORONARY ANGIOGRAPHY N/A 08/31/2018   Procedure: LEFT HEART CATH AND CORONARY ANGIOGRAPHY;  Surgeon: David [name] Mocha, MD;  Location: [facility] [facility];  Service: Cardiovascular;  Laterality: N/A;  . TONSILLECTOMY  1959    There  were no vitals filed for this visit.  Subjective Assessment - 11/27/19 1304    Subjective  LBP. PT has helped but I want to talk to MD about MRI as pain not resolving. Work still inc pain    Pain Score  2     Pain Location  Buttocks    Pain Orientation  Right    Pain Descriptors / Indicators  Aching    Pain Type  Chronic pain    Pain Onset  More than a month ago    Pain Frequency  Intermittent                       [facility] Adult PT Treatment/Exercise - 11/27/19 0001      Lumbar Exercises: Aerobic   Nustep  UE/LE L5 8 min       Traction   Type of Traction  Lumbar    Min (lbs)  20    Max (lbs)  75    Hold Time  90    Rest Time  15    Time  15               PT Short Term Goals - 11/12/19 1222      PT SHORT TERM GOAL #1   Title  He will be indpenent with initial hEP issued  Status  Achieved      PT SHORT TERM GOAL #2   Title  He will report 20% decrease pain with work activity.    Baseline  mainly in buttock    Status  Achieved        PT Long Term Goals - 11/27/19 1302      PT LONG TERM GOAL #1   Title  He will be indpendent with all hEP issued.    Status  Achieved      PT LONG TERM GOAL #2   Title  He will report pain decreased 50% or more in back and hips  with work.    Baseline  gemerally 50% but with work can be higher    Status  Partially Met      PT LONG TERM GOAL #3   Title  FOTO score with improve to  36 % limited    Status  Unable to assess            Plan - 11/27/19 1220    Clinical Impression Statement  David [name] [name] reports he is better but  feels he may  need the MRI to assess his problem and he will contact Dr David [name] [name]. I will keep him on hold until he sees Dr David [name] [name] and a decision is made  on next step for care.  If he returns will do another assessment  , progress HEP and contiue manual and traction    PT Treatment/Interventions  Traction;Moist Heat;Electrical Stimulation;Functional mobility training;Therapeutic  activities;Therapeutic exercise;Patient/family education;Manual techniques;Taping;Passive range of motion;Dry needling    PT Next Visit Plan  continue stretching , manual to lower back , core strength, heat/ES   ? traction  70-80 # or table unlocked    PT Home Exercise Plan  knee to chest,  LTR,  piriformis stretches, PPT, Pilates ab prep    Consulted and Agree with Plan of Care  Patient       Patient will benefit from skilled therapeutic intervention in order to improve the following deficits and impairments:  Pain, Decreased activity tolerance, Postural dysfunction, Increased muscle spasms, Decreased range of motion  Visit Diagnosis: Spinal stenosis of lumbar region without neurogenic claudication  Abnormal posture  Muscle spasm of back     Problem List Patient Active Problem List   Diagnosis Date Noted  . Hip pain 08/07/2019  . History of esophageal varices 09/15/2018  . Alcoholic cirrhosis of liver without ascites ([place])   . Secondary esophageal varices without bleeding ([place])   . Anemia 08/31/2018  . Overweight 09/13/2017  . Poorly controlled type 2 diabetes mellitus with circulatory disorder ([place]) 01/18/2017  . Medial epicondylitis of elbow, left 08/03/2016  . Elevated liver function tests 07/13/2016  . Thrombocytopenia ([place]) 07/13/2016  . Trigger middle finger of left hand 03/02/2016  . Bilateral hand pain 12/02/2015  . Primary osteoarthritis of both first carpometacarpal joints 12/02/2015  . Advance directive discussed with patient 05/16/2015  . Routine general medical examination at a health care facility 02/16/2011  . Erectile dysfunction 02/16/2011  . Essential hypertension 12/21/2010  . Hyperlipidemia LDL goal <70 06/24/2009  . Osteoarthritis, multiple sites 04/16/2009  . CAD (coronary artery disease) 02/04/2009  . Major depressive disorder, recurrent episode ([place]) 08/21/2008  . ALLERGIC RHINITIS 08/21/2008  . SLEEP APNEA 08/21/2008    David [name] [name]  PT 11/27/2019, 1:26 PM  [facility] [address] [place], [place], [address] Phone: [phone]   Fax:  [phone]  Name: David [name]  [name] MRN: [id number] Date of Birth: [birth year] PHYSICAL THERAPY DISCHARGE SUMMARY  Visits from Start of Care: 6  Current functional level related to goals / functional outcomes: Unknown . See above . He has not returned so will discharge   Remaining deficits: Unknown   Education / Equipment: HEP Plan:                                                    Patient goals were not met. Patient is being discharged due to not returning since the last visit.  ?????   Pearson Forster   PT   01/08/20

## 2019-12-11 ENCOUNTER — Encounter: Payer: Self-pay | Admitting: Internal Medicine

## 2019-12-11 ENCOUNTER — Telehealth: Payer: Self-pay

## 2019-12-11 ENCOUNTER — Ambulatory Visit (INDEPENDENT_AMBULATORY_CARE_PROVIDER_SITE_OTHER): Payer: Medicare Other | Admitting: Internal Medicine

## 2019-12-11 ENCOUNTER — Other Ambulatory Visit: Payer: Self-pay

## 2019-12-11 VITALS — BP 124/70 | HR 71 | Temp 97.4°F | Ht 68.5 in | Wt 200.0 lb

## 2019-12-11 DIAGNOSIS — I10 Essential (primary) hypertension: Secondary | ICD-10-CM

## 2019-12-11 DIAGNOSIS — Z0000 Encounter for general adult medical examination without abnormal findings: Secondary | ICD-10-CM

## 2019-12-11 DIAGNOSIS — E1165 Type 2 diabetes mellitus with hyperglycemia: Secondary | ICD-10-CM

## 2019-12-11 DIAGNOSIS — Z7189 Other specified counseling: Secondary | ICD-10-CM

## 2019-12-11 DIAGNOSIS — E1159 Type 2 diabetes mellitus with other circulatory complications: Secondary | ICD-10-CM

## 2019-12-11 DIAGNOSIS — I8510 Secondary esophageal varices without bleeding: Secondary | ICD-10-CM

## 2019-12-11 DIAGNOSIS — F1099 Alcohol use, unspecified with unspecified alcohol-induced disorder: Secondary | ICD-10-CM | POA: Diagnosis not present

## 2019-12-11 DIAGNOSIS — K7030 Alcoholic cirrhosis of liver without ascites: Secondary | ICD-10-CM

## 2019-12-11 DIAGNOSIS — D696 Thrombocytopenia, unspecified: Secondary | ICD-10-CM

## 2019-12-11 DIAGNOSIS — F330 Major depressive disorder, recurrent, mild: Secondary | ICD-10-CM

## 2019-12-11 LAB — RENAL FUNCTION PANEL
Albumin: 3.5 g/dL (ref 3.5–5.2)
BUN: 13 mg/dL (ref 6–23)
CO2: 27 mEq/L (ref 19–32)
Calcium: 8.8 mg/dL (ref 8.4–10.5)
Chloride: 104 mEq/L (ref 96–112)
Creatinine, Ser: 0.94 mg/dL (ref 0.40–1.50)
GFR: 79.17 mL/min (ref >60.00)
Glucose, Bld: 241 mg/dL — ABNORMAL HIGH (ref 70–99)
Phosphorus: 3.3 mg/dL (ref 2.3–4.6)
Potassium: 5.3 mEq/L — ABNORMAL HIGH (ref 3.5–5.1)
Sodium: 136 mEq/L (ref 135–145)

## 2019-12-11 LAB — LIPID PANEL
Cholesterol: 112 mg/dL (ref 0–200)
HDL: 46.6 mg/dL (ref >39.00)
LDL Cholesterol: 52 mg/dL (ref 0–99)
NonHDL: 65.09
Total CHOL/HDL Ratio: 2
Triglycerides: 65 mg/dL (ref 0.0–149.0)
VLDL: 13 mg/dL (ref 0.0–40.0)

## 2019-12-11 LAB — HEMOGLOBIN A1C: Hgb A1c MFr Bld: 8.9 % — ABNORMAL HIGH (ref 4.6–6.5)

## 2019-12-11 LAB — HEPATIC FUNCTION PANEL
ALT: 28 U/L (ref 0–53)
AST: 49 U/L — ABNORMAL HIGH (ref 0–37)
Albumin: 3.5 g/dL (ref 3.5–5.2)
Alkaline Phosphatase: 148 U/L — ABNORMAL HIGH (ref 39–117)
Bilirubin, Direct: 0.4 mg/dL — ABNORMAL HIGH (ref 0.0–0.3)
Total Bilirubin: 0.7 mg/dL (ref 0.2–1.2)
Total Protein: 7.7 g/dL (ref 6.0–8.3)

## 2019-12-11 LAB — CBC
HCT: 26.6 % — ABNORMAL LOW (ref 39.0–52.0)
Hemoglobin: 8.3 g/dL — ABNORMAL LOW (ref 13.0–17.0)
MCHC: 31.3 g/dL (ref 30.0–36.0)
MCV: 86.8 fl (ref 78.0–100.0)
Platelets: 120 10*3/uL — ABNORMAL LOW (ref 150.0–400.0)
RBC: 3.06 Mil/uL — ABNORMAL LOW (ref 4.22–5.81)
RDW: 21 % — ABNORMAL HIGH (ref 11.5–15.5)
WBC: 6.5 10*3/uL (ref 4.0–10.5)

## 2019-12-11 NOTE — Telephone Encounter (Signed)
-----   Message from Milus Banister, MD sent at 12/11/2019 12:32 PM EST ----- Dr. Silvio Pate, Seems like he was lost to follow-up after an office visit in 2019.  We will reach out to him, and get him back into the office quickly.  Thank you   Sheccid Lahmann, He needs my first available office appointment for cirrhosis follow-up.  Thank you ----- Message ----- From: Mauri Pole, MD Sent: 12/11/2019  12:17 PM EST To: Milus Banister, MD, [name], MD, #  Dr Silvio Pate,  He is followed by Dr Ardis Hughs, I am ccing him and Janett Billow so either one of them can get him in for a follow up visit soon.  Thanks Margarette Asal  ----- Message ----- From: [name], MD Sent: 12/11/2019  11:05 AM EST To: Mauri Pole, MD  He really needs to see you again. I did order an abdominal ultrasound already [employer]

## 2019-12-11 NOTE — Assessment & Plan Note (Signed)
Urged him to stop drinking beer Discussed even trying non alcoholic beer

## 2019-12-11 NOTE — Assessment & Plan Note (Signed)
I have personally reviewed the Medicare Annual Wellness questionnaire and have noted 1. The patient's medical and social history 2. Their use of alcohol, tobacco or illicit drugs 3. Their current medications and supplements 4. The patient's functional ability including ADL's, fall risks, home safety risks and hearing or visual             impairment. 5. Diet and physical activities 6. Evidence for depression or mood disorders  The patients weight, height, BMI and visual acuity have been recorded in the chart I have made referrals, counseling and provided education to the patient based review of the above and I have provided the pt with a written personalized care plan for preventive services.  I have provided you with a copy of your personalized plan for preventive services. Please take the time to review along with your updated medication list.  Yearly flu vaccine Normal colon 2 years ago Defer PSA due to age COVID vaccine when available---he is unsure Discussed fitness

## 2019-12-11 NOTE — Assessment & Plan Note (Signed)
See social history 

## 2019-12-11 NOTE — Telephone Encounter (Signed)
Left message on machine to call back   Follow up appt on 01/08/20 950 am

## 2019-12-11 NOTE — Patient Instructions (Signed)
Please stop all added salt--that will help your leg swelling.

## 2019-12-11 NOTE — Assessment & Plan Note (Signed)
Nothing to suggest active bleeding On beta blocker Will check labs

## 2019-12-11 NOTE — Assessment & Plan Note (Signed)
Stress with separation No suicidal ideation Not anhedonic--does okay with work. Not much else to do right now Okay on the paroxetine

## 2019-12-11 NOTE — Assessment & Plan Note (Signed)
Thinks his numbers are better Goes to Dr Cruzita Lederer next week Will check labs Ongoing neuropathy and vascular disease

## 2019-12-11 NOTE — Assessment & Plan Note (Signed)
I am concerned now about possible ascites He is drinking more--discussed Will check ultrasound

## 2019-12-11 NOTE — Assessment & Plan Note (Signed)
Chronic from cirrhosis Will recheck

## 2019-12-11 NOTE — Assessment & Plan Note (Signed)
BP Readings from Last 3 Encounters:  12/11/19 124/70  09/17/19 (!) 150/80  08/07/19 118/74   Control is okay Due for labs

## 2019-12-11 NOTE — Progress Notes (Signed)
Hearing Screening   Method: Audiometry   125Hz  250Hz  500Hz  1000Hz  2000Hz  3000Hz  4000Hz  6000Hz  8000Hz   Right ear:   20 20 40  40    Left ear:   20 20 20  20     Vision Screening Comments: Late January 2020. Has appt 12-24-19.

## 2019-12-11 NOTE — Progress Notes (Signed)
Subjective:    Patient ID: David [name], male    DOB: 22-Mar-1951, 69 y.o.   MRN: [id number]  HPI Here for Medicare wellness visit and follow up of chronic health conditions  This visit occurred during the SARS-CoV-2 public health emergency.  Safety protocols were in place, including screening questions prior to the visit, additional usage of staff PPE, and extensive cleaning of exam room while observing appropriate contact time as indicated for disinfecting solutions.   Reviewed form and advanced directives Reviewed other doctors Increased beers to 3-4 per day now--discussed No tobacco No exercise--but is "on my feet" all day Vision is okay Mild hearing loss---discussed audiology evaluation Did have a couple of falls---?lightheadedness. No sig injury Independent with instrumental ADLs No worrisome memory issues  Having more swelling in ankles and feet Shoes aren't fitting Weight is up ---probably due to this Has increased his beer  No SOB, no orthopnea or PND (sleeps in bed) No chest pain No blood in stools or black stool Stomach bigger----he thinks it is just gained weight  Sugars have been up Now on new insulin--numbers are better Feet are still numb and painful (now also has tendonitis and arthritis in feet per podiatrist)  Known low platelets Due to cirrhosis Still bruises easily--nothing new Continues on PPI  Depression flares at times Chronic and intermittent Continues on the med Split with wife--that is stressful. He moved out  Current Outpatient Medications on File Prior to Visit  Medication Sig Dispense Refill  . ALPHA LIPOIC ACID PO Take 1 tablet by mouth daily.    . Ascorbic Acid (VITAMIN C) 1000 MG tablet Take 1,000 mg by mouth daily.    [name] b complex vitamins tablet Take 1 tablet by mouth daily.    . Cholecalciferol (D3 VITAMIN PO) Take 1 tablet by mouth daily.    . CHROMIUM PO Take 1 tablet by mouth daily.    . Coenzyme Q10 (CO Q 10) 100 MG CAPS Take 1  capsule by mouth daily.    [name] glucose blood (ONE TOUCH ULTRA TEST) test strip Use to test blood sugar 2 times daily as instructed. Dx: E11.59, E11.65 100 each 5  . Insulin Lispro (HUMALOG KWIKPEN) 200 UNIT/ML SOPN Inject 14-20 Units into the skin 3 (three) times daily before meals. 5 pen 5  . insulin NPH Human (NOVOLIN N RELION) 100 UNIT/ML injection Inject under skin 30 units in a.m. and 25 to 30 units at bedtime 20 mL 11  . Insulin Pen Needle 32G X 4 MM MISC Use 4x a day - with insulin 200 each 3  . Insulin Syringe-Needle U-100 (RELION INSULIN SYRINGE) 31G X 15/64" 0.5 ML MISC Inject twice a day 200 each 3  . losartan (COZAAR) 50 MG tablet TAKE 1 & 1/2 (ONE & ONE-HALF) TABLETS BY MOUTH ONCE DAILY 135 tablet 3  . metFORMIN (GLUCOPHAGE) 1000 MG tablet TAKE 1 TABLET BY MOUTH TWICE DAILY WITH MEALS 180 tablet 0  . Methylsulfonylmethane (MSM PO) Take 1 tablet by mouth daily.    . Multiple Vitamin (MULTIVITAMIN WITH MINERALS) TABS tablet Take 1 tablet by mouth at bedtime.    Glory Rosebush DELICA LANCETS FINE MISC Use to test blood sugar 2 times daily as instructed. Dx: E11.59, E11.65 100 each 5  . pantoprazole (PROTONIX) 40 MG tablet Take 1 tablet (40 mg total) by mouth 2 (two) times daily. 180 tablet 3  . PARoxetine (PAXIL) 20 MG tablet TAKE 2 TABLETS BY MOUTH ONCE DAILY FOR  DEPRESSION 180  tablet 0  . propranolol (INDERAL) 20 MG tablet Take 1 tablet (20 mg total) by mouth 2 (two) times daily. 180 tablet 1  . rosuvastatin (CRESTOR) 10 MG tablet Take 1 tablet by mouth once daily 90 tablet 3  . sildenafil (REVATIO) 20 MG tablet Take 3-5 tablets (60-100 mg total) by mouth daily as needed (for ED). 50 tablet 11  . triamcinolone cream (KENALOG) 0.1 % APPLY TOPICALLY TO SCALP TWO TIMES DAILY AS NEEDED 30 g 0   No current facility-administered medications on file prior to visit.    Allergies  Allergen Reactions  . Venlafaxine     Slowed his urine stream    Past Medical History:  Diagnosis Date  .  Allergy   . Arthritis   . CAD (coronary artery disease)    s/p bifurcation stenting (DES x2) and stenting bifurcation lesion distal RCA (1 DES)  . Cataract   . Depression   . Diabetes mellitus   . GERD (gastroesophageal reflux disease)   . Hyperlipemia   . Hypertension   . Myocardial infarction ([place])   . Sleep apnea    CPAP    Past Surgical History:  Procedure Laterality Date  . BIOPSY  09/01/2018   Procedure: BIOPSY;  Surgeon: Mauri Pole, MD;  Location: [facility];  Service: Endoscopy;;  . CATARACT EXTRACTION Bilateral   . CORONARY ANGIOPLASTY WITH STENT PLACEMENT   March 2010   Dr. Olevia Perches  . ESOPHAGOGASTRODUODENOSCOPY (EGD) WITH PROPOFOL N/A 09/01/2018   Procedure: ESOPHAGOGASTRODUODENOSCOPY (EGD) WITH PROPOFOL;  Surgeon: Mauri Pole, MD;  Location: [facility];  Service: Endoscopy;  Laterality: N/A;  . LEFT HEART CATH AND CORONARY ANGIOGRAPHY N/A 08/31/2018   Procedure: LEFT HEART CATH AND CORONARY ANGIOGRAPHY;  Surgeon: Sherren Mocha, MD;  Location: [facility];  Service: Cardiovascular;  Laterality: N/A;  . TONSILLECTOMY  1959    Family History  Problem Relation Age of Onset  . Heart failure Father        Deceased 43 y/o  . Diabetes Father   . Cancer Father        Prostate  . Heart failure Mother        Deceased 108 y/o  . Alcohol abuse Mother   . Schizophrenia Son        Paranoid  . Uveitis Son   . Cancer Brother        prostate  . Colon cancer Neg Hx   . Esophageal cancer Neg Hx   . Stomach cancer Neg Hx   . Liver cancer Neg Hx   . Pancreatic cancer Neg Hx   . Rectal cancer Neg Hx     Social History   Socioeconomic History  . Marital status: Legally Separated    Spouse name: Not on file  . Number of children: 2  . Years of education: Not on file  . Highest education level: Not on file  Occupational History  . Occupation: Patent examiner buildings    Comment: site closed  . Occupation: Engineering geologist    Comment:  part time  Tobacco Use  . Smoking status: Former Smoker    Types: Cigarettes    Quit date: 11/30/1983    Years since quitting: 36.0  . Smokeless tobacco: Never Used  Substance and Sexual Activity  . Alcohol use: Yes    Alcohol/week: 20.0 standard drinks    Types: 20 Standard drinks or equivalent per week    Comment: beer  . Drug use: No  .  Sexual activity: Not on file  Other Topics Concern  . Not on file  Social History Narrative   No living will   Would want want son [name], as health care POA   Would accept resuscitation   Not sure about tube feeds--but doesn't want if cognitively unaware   Social Determinants of Health   Financial Resource Strain:   . Difficulty of Paying Living Expenses: Not on file  Food Insecurity:   . Worried About Charity fundraiser in the Last Year: Not on file  . Ran Out of Food in the Last Year: Not on file  Transportation Needs:   . Lack of Transportation (Medical): Not on file  . Lack of Transportation (Non-Medical): Not on file  Physical Activity:   . Days of Exercise per Week: Not on file  . Minutes of Exercise per Session: Not on file  Stress:   . Feeling of Stress : Not on file  Social Connections:   . Frequency of Communication with Friends and Family: Not on file  . Frequency of Social Gatherings with Friends and Family: Not on file  . Attends Religious Services: Not on file  . Active Member of Clubs or Organizations: Not on file  . Attends Archivist Meetings: Not on file  . Marital Status: Not on file  Intimate Partner Violence:   . Fear of Current or Ex-Partner: Not on file  . Emotionally Abused: Not on file  . Physically Abused: Not on file  . Sexually Abused: Not on file   Review of Systems Nocturia x 5-6 times--still sleeps okay though Appetite is fine Wears seat belt Overdue for dentist No suspicious skin lesions Bowels are fine--no blood Ongoing back pain--goes into buttocks. Did see Ninfa Linden for this. Did do  some physical therapy    Objective:   Physical Exam  Constitutional: He is oriented to person, place, and time. He appears well-developed. No distress.  HENT:  Mouth/Throat: Oropharynx is clear and moist. No oropharyngeal exudate.  Neck: No thyromegaly present.  Cardiovascular: Normal rate, regular rhythm, normal heart sounds and intact distal pulses. Exam reveals no gallop.  No murmur heard. Respiratory: Effort normal and breath sounds normal. No respiratory distress. He has no wheezes. He has no rales.  GI: Soft. He exhibits distension. There is no abdominal tenderness. There is no rebound and no guarding.  Seems to have some ascites  Musculoskeletal:     Comments: 1+ pitting edema in feet  Lymphadenopathy:    He has no cervical adenopathy.  Neurological: He is alert and oriented to person, place, and time.  President--"Biden, Trump right now, Quay Burow" [id number] Recall 3/3  Decreased sensation in feet  Skin: No rash noted.  No foot lesions  Psychiatric: He has a normal mood and affect. His behavior is normal.           Assessment & Plan:

## 2019-12-11 NOTE — Telephone Encounter (Signed)
The pt has been advised of the appt information.  The pt has been advised of the information and verbalized understanding.

## 2019-12-14 ENCOUNTER — Other Ambulatory Visit: Payer: Self-pay | Admitting: Internal Medicine

## 2019-12-18 ENCOUNTER — Encounter: Payer: Self-pay | Admitting: Internal Medicine

## 2019-12-18 ENCOUNTER — Ambulatory Visit: Payer: Medicare Other | Admitting: Internal Medicine

## 2019-12-18 ENCOUNTER — Other Ambulatory Visit: Payer: Self-pay

## 2019-12-18 VITALS — BP 122/70 | HR 56 | Ht 68.5 in | Wt 196.0 lb

## 2019-12-18 DIAGNOSIS — E663 Overweight: Secondary | ICD-10-CM

## 2019-12-18 DIAGNOSIS — E1165 Type 2 diabetes mellitus with hyperglycemia: Secondary | ICD-10-CM

## 2019-12-18 DIAGNOSIS — E1159 Type 2 diabetes mellitus with other circulatory complications: Secondary | ICD-10-CM

## 2019-12-18 DIAGNOSIS — E785 Hyperlipidemia, unspecified: Secondary | ICD-10-CM | POA: Diagnosis not present

## 2019-12-18 NOTE — Progress Notes (Signed)
Patient ID: David [name], male   DOB: Jan 12, 1951, 69 y.o.   MRN: [id number]   This visit occurred during the SARS-CoV-2 public health emergency.  Safety protocols were in place, including screening questions prior to the visit, additional usage of staff PPE, and extensive cleaning of exam room while observing appropriate contact time as indicated for disinfecting solutions.   HPI: David [name] is a 69 y.o.-year-old male, returning for f/u for DM2, dx in ~2008, insulin-dependent since 09/2015, uncontrolled, with complications (CAD - s/p stents in 2013, PN). Last visit 3 months ago.  He and his wife for 40 years explained and he is now living outside of his home.  He is eating habits are not ideal (last week) and he continues to drink alcohol.  Reviewed HbA1c levels: Lab Results  Component Value Date   HGBA1C 8.9 (H) 12/11/2019   HGBA1C 9.3 (A) 09/17/2019   HGBA1C 9.9 (A) 06/11/2019  03/05/2016: HbA1c 9.1%  He is on: - Metformin 1000 mg 2x a day, with meals - Lantus 40 >> 48 units at bedtime >> NPH 30 in am and 35-40 at night - Humalog 8-12 >> 14-20 (using 16 units) 15 min before a meal - started 05/2019 >> ran out for 2 weeks >> restarted He was on Ozempic from 01/2019 to 08/2019 but had to come off due to price. He was on Bydureon 2 mg weekly - started 01/30/2017 >> stopped 04/2017 b/c price - could not restart  Pt checks his sugars twice a day per review of his log: - am: 157-322, 363 >> 208-287, 300, 329 >> 95-240, 261, 300 - 2h after b'fast: n/c  - before lunch: 118-210 >> 85-187, 201 >> 158, 327 >> n/c - 2h after lunch: n/c - before dinner:68, 119-265 >> 200-360 >> 164-280, 300 >> 79-206, 235 - 2h after dinner: n/c - bedtime:  126-209, 225-240 (candy), 261 >> 160-215 >> n/c - nighttime: n/c Lowest sugar was 68 >> 157 >> 164 >> 79; it is unclear at which CBG level he has hypoglycemia awareness. Highest sugar was 280 >> 363 >> 329 >> 261.  Glucometer: One Touch  Pt's meals  are: - Breakfast: protein shake - Lunch: n/a - Dinner: meat + veggies /potatoes or salad - Snacks: no Diet sodas, ice tea with splenda.  He still eats sweets. Still drinking at least 4-5 beers a day.  -No CKD, last BUN/creatinine:  Lab Results  Component Value Date   BUN 13 12/11/2019   CREATININE 0.94 12/11/2019  On losartan. -+ HL; last set of lipids: Lab Results  Component Value Date   CHOL 112 12/11/2019   HDL 46.60 12/11/2019   LDLCALC 52 12/11/2019   LDLDIRECT 103.9 11/05/2013   TRIG 65.0 12/11/2019   CHOLHDL 2 12/11/2019  On Crestor. - last eye exam was in 09/2018: No DR.  Has previous history of cataract surgery. Coming up next week. -+ Numbness and tingling in his feet  He also has HTN, depression.  He has a history of anabolic steroid use for muscle building.  He had a cardiac cath 08/2018 for presyncope: Stable coronary anatomy without high-grade coronary stenosis.  He is on medical therapy for CAD.  He was found to be anemic (Hb 8.1) >> EGD report reviewed:  - Non-bleeding grade II esophageal varices. - Non-bleeding gastric ulcers with a clean ulcer base (Forrest Class III). Biopsied. - Erythematous duodenopathy. - Normal second portion of the duodenum.  He will have a liver ultrasound soon.  ROS: Constitutional: +  weight gain/no weight loss, no fatigue, no subjective hyperthermia, no subjective hypothermia Eyes: no blurry vision, no xerophthalmia ENT: no sore throat, no nodules palpated in neck, no dysphagia, no odynophagia, no hoarseness Cardiovascular: no CP/no SOB/no palpitations/+ leg swelling Respiratory: no cough/no SOB/no wheezing Gastrointestinal: no N/no V/no D/no C/no acid reflux Musculoskeletal: no muscle aches/no joint aches Skin: no rashes, no hair loss Neurological: no tremors/+ numbness/+ tingling/no dizziness  I reviewed pt's medications, allergies, PMH, social hx, family hx, and changes were documented in the history of present illness.  Otherwise, unchanged from my initial visit note.   Past Medical History:  Diagnosis Date  . Allergy   . Arthritis   . CAD (coronary artery disease)    s/p bifurcation stenting (DES x2) and stenting bifurcation lesion distal RCA (1 DES)  . Cataract   . Depression   . Diabetes mellitus   . GERD (gastroesophageal reflux disease)   . Hyperlipemia   . Hypertension   . Myocardial infarction ([place])   . Sleep apnea    CPAP   Past Surgical History:  Procedure Laterality Date  . BIOPSY  09/01/2018   Procedure: BIOPSY;  Surgeon: Mauri Pole, MD;  Location: [facility];  Service: Endoscopy;;  . CATARACT EXTRACTION Bilateral   . CORONARY ANGIOPLASTY WITH STENT PLACEMENT   March 2010   Dr. Olevia Perches  . ESOPHAGOGASTRODUODENOSCOPY (EGD) WITH PROPOFOL N/A 09/01/2018   Procedure: ESOPHAGOGASTRODUODENOSCOPY (EGD) WITH PROPOFOL;  Surgeon: Mauri Pole, MD;  Location: [facility];  Service: Endoscopy;  Laterality: N/A;  . LEFT HEART CATH AND CORONARY ANGIOGRAPHY N/A 08/31/2018   Procedure: LEFT HEART CATH AND CORONARY ANGIOGRAPHY;  Surgeon: Sherren Mocha, MD;  Location: [facility];  Service: Cardiovascular;  Laterality: N/A;  . TONSILLECTOMY  1959   Social History   Social History  . Marital Status: Married    Spouse Name: N/A  . Number of Children: 2   Occupational History  . 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 Smoking status: Former Smoker    Types: Cigarettes    Quit date: 11/30/1983  . Smokeless tobacco: Never Used  . Alcohol Use:  beer, 3-4 times a week, 6-8 drinks at the time       . Drug Use: No   Social History Narrative   No living will   Would want wife, then son [name], as health care POA   Would accept resuscitation   Not sure about tube feeds   Current Outpatient Medications on File Prior to Visit  Medication Sig Dispense Refill  . ALPHA LIPOIC ACID PO Take 1 tablet by mouth daily.     . Ascorbic Acid (VITAMIN C) 1000 MG tablet Take 1,000 mg by mouth daily.    [name] b complex vitamins tablet Take 1 tablet by mouth daily.    . Cholecalciferol (D3 VITAMIN PO) Take 1 tablet by mouth daily.    . CHROMIUM PO Take 1 tablet by mouth daily.    . Coenzyme Q10 (CO Q 10) 100 MG CAPS Take 1 capsule by mouth daily.    [name] glucose blood (ONE TOUCH ULTRA TEST) test strip Use to test blood sugar 2 times daily as instructed. Dx: E11.59, E11.65 100 each 5  . Insulin Lispro (HUMALOG KWIKPEN) 200 UNIT/ML SOPN Inject 14-20 Units into the skin 3 (three) times daily before meals. 5 pen 5  . insulin NPH Human (NOVOLIN N  RELION) 100 UNIT/ML injection Inject under skin 30 units in a.m. and 25 to 30 units at bedtime 20 mL 11  . Insulin Pen Needle 32G X 4 MM MISC Use 4x a day - with insulin 200 each 3  . Insulin Syringe-Needle U-100 (RELION INSULIN SYRINGE) 31G X 15/64" 0.5 ML MISC Inject twice a day 200 each 3  . losartan (COZAAR) 50 MG tablet TAKE 1 & 1/2 (ONE & ONE-HALF) TABLETS BY MOUTH ONCE DAILY 135 tablet 3  . metFORMIN (GLUCOPHAGE) 1000 MG tablet TAKE 1 TABLET BY MOUTH TWICE DAILY WITH MEALS 180 tablet 0  . Methylsulfonylmethane (MSM PO) Take 1 tablet by mouth daily.    . Multiple Vitamin (MULTIVITAMIN WITH MINERALS) TABS tablet Take 1 tablet by mouth at bedtime.    Glory Rosebush DELICA LANCETS FINE MISC Use to test blood sugar 2 times daily as instructed. Dx: E11.59, E11.65 100 each 5  . pantoprazole (PROTONIX) 40 MG tablet Take 1 tablet by mouth twice daily 180 tablet 1  . PARoxetine (PAXIL) 20 MG tablet TAKE 2 TABLETS BY MOUTH ONCE DAILY FOR  DEPRESSION 180 tablet 0  . propranolol (INDERAL) 20 MG tablet Take 1 tablet (20 mg total) by mouth 2 (two) times daily. 180 tablet 1  . rosuvastatin (CRESTOR) 10 MG tablet Take 1 tablet by mouth once daily 90 tablet 3  . sildenafil (REVATIO) 20 MG tablet Take 3-5 tablets (60-100 mg total) by mouth daily as needed (for ED). 50 tablet 11  . triamcinolone cream  (KENALOG) 0.1 % APPLY TOPICALLY TO SCALP TWO TIMES DAILY AS NEEDED 30 g 0   No current facility-administered medications on file prior to visit.   Allergies  Allergen Reactions  . Venlafaxine     Slowed his urine stream   Family History  Problem Relation Age of Onset  . Heart failure Father        Deceased 39 y/o  . Diabetes Father   . Cancer Father        Prostate  . Heart failure Mother        Deceased 4 y/o  . Alcohol abuse Mother   . Schizophrenia Son        Paranoid  . Uveitis Son   . Cancer Brother        prostate  . Colon cancer Neg Hx   . Esophageal cancer Neg Hx   . Stomach cancer Neg Hx   . Liver cancer Neg Hx   . Pancreatic cancer Neg Hx   . Rectal cancer Neg Hx    PE: BP 122/70   Pulse (!) 56   Ht 5' 8.5" (1.74 m)   Wt 196 lb (88.9 kg)   SpO2 95%   BMI 29.37 kg/m  Body mass index is 29.37 kg/m. Wt Readings from Last 3 Encounters:  12/18/19 196 lb (88.9 kg)  12/11/19 200 lb (90.7 kg)  10/23/19 190 lb (86.2 kg)   Constitutional: overweight, in NAD Eyes: PERRLA, EOMI, no exophthalmos ENT: moist mucous membranes, no thyromegaly, no cervical lymphadenopathy Cardiovascular: RRR, No MRG, + pitting edema BLE up to knees Respiratory: CTA B Gastrointestinal: abdomen soft, NT, ND, BS+ Musculoskeletal: no deformities, strength intact in all 4 Skin: moist, warm, no rashes Neurological: + tremor with outstretched hands, DTR normal in all 4  ASSESSMENT: 1. DM2, insulin-dependent, uncontrolled, with complications - CAD - s/p 4 stents 2013 - seeing Dr Burt Knack - PN  Component     Latest Ref Rng 06/09/2016  C-Peptide  0.80-3.85 ng/mL 1.78  Glucose, Fasting     65 - 99 mg/dL 184 (H)  Glutamic Acid Decarb Ab     <5 IU/mL <5  Pancreatic Islet Cell Antibody     < 5 JDF Units <5  Slight insulin deficiency, but no clear type 1 diabetes.  2. HL  3. Overweight  PLAN:  1. Patient with longstanding, uncontrolled, type 2 diabetes, with increased  variability of his blood sugars.  We checked him for type 1 diabetes but the investigation was negative. -Before last visit he had to come off of Ozempic due to price.  We increased his insulin doses at that time and continued Metformin.  We also discussed about improving his diet and stopping alcohol. -Since then, since sugars were high, we switch from Lantus to NPH insulin.  Sugars improved afterwards.  Unfortunately, however, he came off Humalog for 2 weeks and he just restarted it back.  I strongly advised him to -At this visit, we reviewed together his latest HbA1c from few days ago and this has improved to 8.9%. -Reviewed his blood sugars, they are still variable, but improved on intermediate acting insulin so we will continue this.  At this visit, I advised him to vary the dose of Humalog depending on the size of his meals, since now he is only using the 16 unit dose.  We will not make any other changes for now but I did advise him to discuss with his insurance to see if Ozempic is covered.  He would greatly benefit from having his diabetes regimen especially in the context of his heart disease - I suggested to:  Patient Instructions  Please continue: - Metformin 1000 mg 2x a day, with meals - NPH 30 units in am and 35-40 units at bedtime  Use: - Humalog 14-20 units 15 min before a meal  Check with your insurance if they cover [place].  Please return in 3 months with your sugar log.   - advised to check sugars at different times of the day - 3x a day, rotating check times - advised for yearly eye exams >> he is not UTD - return to clinic in 3 months      2. HL -Reviewed latest lipid panel from a week ago: All fractions at goal, LDL excellent Lab Results  Component Value Date   CHOL 112 12/11/2019   HDL 46.60 12/11/2019   LDLCALC 52 12/11/2019   LDLDIRECT 103.9 11/05/2013   TRIG 65.0 12/11/2019   CHOLHDL 2 12/11/2019  -Continues Crestor without side effects  3.  Overweight -Before last visit, he gained 6 pounds, since then, he gained 6 more. -He had to come off Ozempic before last visit since this was too expensive.  We are hoping to restart  this if  At thatcovered by his insurance. -At this visit, I again advised him to cut out sweets and alcohol.  However, he is under a lot of stress and not living at home and relying mostly on fast food.  He continues to drink alcohol but tries to cut down.  Philemon Kingdom, MD PhD [facility] Endocrinology

## 2019-12-18 NOTE — Patient Instructions (Addendum)
Please continue: - Metformin 1000 mg 2x a day, with meals - NPH 30 units in am and 35-40 units at bedtime  Use: - Humalog 14-20 units 15 min before a meal  Check with your insurance if they cover Ozempic.  Please return in 3 months with your sugar log.

## 2019-12-24 ENCOUNTER — Ambulatory Visit
Admission: RE | Admit: 2019-12-24 | Discharge: 2019-12-24 | Disposition: A | Discharge status: Home / Self Care | Payer: Medicare Other | Source: Ambulatory Visit | Attending: Internal Medicine | Admitting: Internal Medicine

## 2019-12-24 DIAGNOSIS — H40053 Ocular hypertension, bilateral: Secondary | ICD-10-CM | POA: Diagnosis not present

## 2019-12-24 DIAGNOSIS — H0288B Meibomian gland dysfunction left eye, upper and lower eyelids: Secondary | ICD-10-CM | POA: Diagnosis not present

## 2019-12-24 DIAGNOSIS — E119 Type 2 diabetes mellitus without complications: Secondary | ICD-10-CM | POA: Diagnosis not present

## 2019-12-24 DIAGNOSIS — K7460 Unspecified cirrhosis of liver: Secondary | ICD-10-CM | POA: Diagnosis not present

## 2019-12-24 DIAGNOSIS — H0288A Meibomian gland dysfunction right eye, upper and lower eyelids: Secondary | ICD-10-CM | POA: Diagnosis not present

## 2019-12-24 DIAGNOSIS — K7030 Alcoholic cirrhosis of liver without ascites: Secondary | ICD-10-CM

## 2019-12-24 LAB — HM DIABETES EYE EXAM

## 2019-12-24 MED ORDER — SILDENAFIL CITRATE 100 MG PO TABS: 100.0000 mg | ORAL_TABLET | Freq: Every day | ORAL | 5 refills | Status: DC | PRN

## 2019-12-28 ENCOUNTER — Encounter: Payer: Self-pay | Admitting: Podiatry

## 2019-12-29 ENCOUNTER — Other Ambulatory Visit: Payer: Self-pay | Admitting: Internal Medicine

## 2020-01-08 ENCOUNTER — Other Ambulatory Visit: Payer: Self-pay

## 2020-01-08 ENCOUNTER — Other Ambulatory Visit (INDEPENDENT_AMBULATORY_CARE_PROVIDER_SITE_OTHER): Payer: Medicare Other

## 2020-01-08 ENCOUNTER — Encounter: Payer: Self-pay | Admitting: Gastroenterology

## 2020-01-08 ENCOUNTER — Ambulatory Visit: Payer: Medicare Other | Admitting: Gastroenterology

## 2020-01-08 VITALS — BP 134/68 | HR 64 | Temp 98.6°F | Ht 68.5 in | Wt 203.1 lb

## 2020-01-08 DIAGNOSIS — K7030 Alcoholic cirrhosis of liver without ascites: Secondary | ICD-10-CM

## 2020-01-08 LAB — CBC WITH DIFFERENTIAL/PLATELET
Basophils Absolute: 0 10*3/uL (ref 0.0–0.1)
Basophils Relative: 0.8 % (ref 0.0–3.0)
Eosinophils Absolute: 0.4 10*3/uL (ref 0.0–0.7)
Eosinophils Relative: 5.7 % — ABNORMAL HIGH (ref 0.0–5.0)
HCT: 27.8 % — ABNORMAL LOW (ref 39.0–52.0)
Hemoglobin: 8.7 g/dL — ABNORMAL LOW (ref 13.0–17.0)
Lymphocytes Relative: 18.1 % (ref 12.0–46.0)
Lymphs Abs: 1.1 10*3/uL (ref 0.7–4.0)
MCHC: 31.3 g/dL (ref 30.0–36.0)
MCV: 86.4 fl (ref 78.0–100.0)
Monocytes Absolute: 1 10*3/uL (ref 0.1–1.0)
Monocytes Relative: 16.7 % — ABNORMAL HIGH (ref 3.0–12.0)
Neutro Abs: 3.7 10*3/uL (ref 1.4–7.7)
Neutrophils Relative %: 58.7 % (ref 43.0–77.0)
Platelets: 132 10*3/uL — ABNORMAL LOW (ref 150.0–400.0)
RBC: 3.22 Mil/uL — ABNORMAL LOW (ref 4.22–5.81)
RDW: 19.6 % — ABNORMAL HIGH (ref 11.5–15.5)
WBC: 6.2 10*3/uL (ref 4.0–10.5)

## 2020-01-08 LAB — COMPREHENSIVE METABOLIC PANEL
ALT: 34 U/L (ref 0–53)
AST: 57 U/L — ABNORMAL HIGH (ref 0–37)
Albumin: 3.6 g/dL (ref 3.5–5.2)
Alkaline Phosphatase: 124 U/L — ABNORMAL HIGH (ref 39–117)
BUN: 11 mg/dL (ref 6–23)
CO2: 27 mEq/L (ref 19–32)
Calcium: 9.1 mg/dL (ref 8.4–10.5)
Chloride: 105 mEq/L (ref 96–112)
Creatinine, Ser: 0.9 mg/dL (ref 0.40–1.50)
GFR: 83.23 mL/min (ref >60.00)
Glucose, Bld: 114 mg/dL — ABNORMAL HIGH (ref 70–99)
Potassium: 4.7 mEq/L (ref 3.5–5.1)
Sodium: 137 mEq/L (ref 135–145)
Total Bilirubin: 0.9 mg/dL (ref 0.2–1.2)
Total Protein: 7.9 g/dL (ref 6.0–8.3)

## 2020-01-08 LAB — PROTIME-INR
INR: 1.2 ratio — ABNORMAL HIGH (ref 0.8–1.0)
Prothrombin Time: 13.1 s (ref 9.6–13.1)

## 2020-01-08 MED ORDER — SPIRONOLACTONE 50 MG PO TABS: 50.0000 mg | ORAL_TABLET | Freq: Every day | ORAL | 6 refills | Status: DC

## 2020-01-08 MED ORDER — FUROSEMIDE 20 MG PO TABS: 20.0000 mg | ORAL_TABLET | Freq: Every day | ORAL | 6 refills | Status: DC

## 2020-01-08 NOTE — Patient Instructions (Signed)
If you are age 71 or older, your body mass index should be between 23-30. Your Body mass index is 30.44 kg/m. If this is out of the aforementioned range listed, please consider follow up with your Primary Care Provider.  If you are age 66 or younger, your body mass index should be between 19-25. Your Body mass index is 30.44 kg/m. If this is out of the aformentioned range listed, please consider follow up with your Primary Care Provider.   Your provider has requested that you go to the basement level for lab work before leaving today. Press "B" on the elevator. The lab is located at the first door on the left as you exit the elevator.  Please return to the lab in 2 weeks (01-22-20) for additional lab work: BMET  We have sent the following medications to your pharmacy for you to pick up at your convenience:  START: Aldactone 50mg  one tablet daily START: Lasix 20mg  one tablet daily  Please follow low sodium diet.  Please continue to reduce alcohol consumption.  Thank you, Dr Ardis Hughs

## 2020-01-08 NOTE — Progress Notes (Signed)
Review of pertinent gastrointestinal problems: 1. Cirrhosis, etoh related, diagnosed 2019.  Long history of alcohol abuse: beer, bourbon. Workup 2019 labs ANA negative, normal iron studies, Hep C Ab negative, Hep B Surface Ab negative, Hep B Surface Ag negative, Hep A total Ab negative.   Imaging: [place] October 2019 showed nodule lower liver.  No focal lesions.  Ultrasound January 2021 no focal liver lesion.  + Mild to moderate ascites.  EGD 08/2018: Grade II distal esophagus varices, several small distal gastric ulcers (path H. Pylori negative). Started on nadolol 20mg  daily, changed to propranolol 20 BID at some point. 2.  History of adenomatous colon polyps.  Colonoscopy 2012 2 subcentimeter adenomas removed.  Colonoscopy 12/2017 normal examination.  Recall recommended 10 years.  HPI: This is a very pleasant 69 yo man  He has been drinking heavily for at least 10 years.  He does not consider him self and alcoholic however he does know he over drinks.  When he has been drinking a lot it is about 8 beers a day, every day.  Lately he has been trying to cut back.  He does not drink every day and when he does he probably drinks 5 or 6 beers a day.  He has a lot of extra salt to his food  He has had no overt GI bleeding.  He has been gaining weight in his abdomen and also in his legs over the past 2 months.  No overt encephalopathy  Personal history of adenomatous colon polyps  Alcoholic related cirrhosis   Abdominal ultrasound January 2021 showed "mild to moderate ascites".  Nodular liver without focal lesions.  Blood work January 2021 platelets 120, hemoglobin 8.3, basic metabolic profile normal except for potassium 5.3, AST 49, ALT 28, alkaline phosphatase 148.  Hemoglobin A1c 8.9  He was last here in our office October 2019.  He saw Janett Billow.  This was shortly after a hospitalization for acute MI and GI bleed I believe.  We had asked that he follow-up with me in 3 months.  We have not heard  from him since.   ROS: complete GI ROS as described in HPI, all other review negative.  Constitutional:  No unintentional weight loss   Past Medical History:  Diagnosis Date  . Allergy   . Arthritis   . CAD (coronary artery disease)    s/p bifurcation stenting (DES x2) and stenting bifurcation lesion distal RCA (1 DES)  . Cataract   . Depression   . Diabetes mellitus   . GERD (gastroesophageal reflux disease)   . Hyperlipemia   . Hypertension   . Myocardial infarction ([place])   . Sleep apnea    CPAP    Past Surgical History:  Procedure Laterality Date  . BIOPSY  09/01/2018   Procedure: BIOPSY;  Surgeon: Mauri Pole, MD;  Location: [facility];  Service: Endoscopy;;  . CATARACT EXTRACTION Bilateral   . CORONARY ANGIOPLASTY WITH STENT PLACEMENT   March 2010   Dr. Olevia Perches  . ESOPHAGOGASTRODUODENOSCOPY (EGD) WITH PROPOFOL N/A 09/01/2018   Procedure: ESOPHAGOGASTRODUODENOSCOPY (EGD) WITH PROPOFOL;  Surgeon: Mauri Pole, MD;  Location: [facility];  Service: Endoscopy;  Laterality: N/A;  . LEFT HEART CATH AND CORONARY ANGIOGRAPHY N/A 08/31/2018   Procedure: LEFT HEART CATH AND CORONARY ANGIOGRAPHY;  Surgeon: Sherren Mocha, MD;  Location: [facility];  Service: Cardiovascular;  Laterality: N/A;  . TONSILLECTOMY  1959    Current Outpatient Medications  Medication Sig Dispense Refill  . ALPHA LIPOIC  ACID PO Take 1 tablet by mouth daily.    . Ascorbic Acid (VITAMIN C) 1000 MG tablet Take 1,000 mg by mouth daily.    [name] b complex vitamins tablet Take 1 tablet by mouth daily.    . Cholecalciferol (D3 VITAMIN PO) Take 1 tablet by mouth daily.    . CHROMIUM PO Take 1 tablet by mouth daily.    . Coenzyme Q10 (CO Q 10) 100 MG CAPS Take 1 capsule by mouth daily.    [name] glucose blood (ONE TOUCH ULTRA TEST) test strip Use to test blood sugar 2 times daily as instructed. Dx: E11.59, E11.65 100 each 5  . Insulin Lispro (HUMALOG KWIKPEN) 200 UNIT/ML SOPN Inject 14-20 Units  into the skin 3 (three) times daily before meals. 5 pen 5  . insulin NPH Human (NOVOLIN N RELION) 100 UNIT/ML injection Inject under skin 30 units in a.m. and 25 to 30 units at bedtime 20 mL 11  . Insulin Pen Needle 32G X 4 MM MISC Use 4x a day - with insulin 200 each 3  . Insulin Syringe-Needle U-100 (RELION INSULIN SYRINGE) 31G X 15/64" 0.5 ML MISC Inject twice a day 200 each 3  . losartan (COZAAR) 50 MG tablet TAKE 1 & 1/2 (ONE & ONE-HALF) TABLETS BY MOUTH ONCE DAILY 135 tablet 3  . metFORMIN (GLUCOPHAGE) 1000 MG tablet TAKE 1 TABLET BY MOUTH TWICE DAILY WITH MEALS 180 tablet 0  . Methylsulfonylmethane (MSM PO) Take 1 tablet by mouth daily.    . Multiple Vitamin (MULTIVITAMIN WITH MINERALS) TABS tablet Take 1 tablet by mouth at bedtime.    Glory Rosebush DELICA LANCETS FINE MISC Use to test blood sugar 2 times daily as instructed. Dx: E11.59, E11.65 100 each 5  . pantoprazole (PROTONIX) 40 MG tablet Take 1 tablet by mouth twice daily 180 tablet 1  . PARoxetine (PAXIL) 20 MG tablet TAKE 2 TABLETS BY MOUTH ONCE DAILY FOR DEPRESSION 180 tablet 0  . propranolol (INDERAL) 20 MG tablet Take 1 tablet (20 mg total) by mouth 2 (two) times daily. 180 tablet 1  . rosuvastatin (CRESTOR) 10 MG tablet Take 1 tablet by mouth once daily 90 tablet 3  . sildenafil (VIAGRA) 100 MG tablet Take 1 tablet (100 mg total) by mouth daily as needed for erectile dysfunction. 10 tablet 5  . triamcinolone cream (KENALOG) 0.1 % APPLY TOPICALLY TO SCALP TWO TIMES DAILY AS NEEDED 30 g 0   No current facility-administered medications for this visit.    Allergies as of 01/08/2020 - Review Complete 01/08/2020  Allergen Reaction Noted  . Venlafaxine  07/09/2014    Family History  Problem Relation Age of Onset  . Heart failure Father        Deceased 66 y/o  . Diabetes Father   . Cancer Father        Prostate  . Heart failure Mother        Deceased 38 y/o  . Alcohol abuse Mother   . Schizophrenia Son        Paranoid  .  Uveitis Son   . Cancer Brother        prostate  . Colon cancer Neg Hx   . Esophageal cancer Neg Hx   . Stomach cancer Neg Hx   . Liver cancer Neg Hx   . Pancreatic cancer Neg Hx   . Rectal cancer Neg Hx     Social History   Socioeconomic History  . Marital status: Legally Separated  Spouse name: Not on file  . Number of children: 2  . Years of education: Not on file  . Highest education level: Not on file  Occupational History  . Occupation: Patent examiner buildings    Comment: site closed  . Occupation: Engineering geologist    Comment: part time  Tobacco Use  . Smoking status: Former Smoker    Types: Cigarettes    Quit date: 11/30/1983    Years since quitting: 36.1  . Smokeless tobacco: Never Used  Substance and Sexual Activity  . Alcohol use: Yes    Alcohol/week: 20.0 standard drinks    Types: 20 Standard drinks or equivalent per week    Comment: beer  . Drug use: No  . Sexual activity: Not on file  Other Topics Concern  . Not on file  Social History Narrative   No living will   Would want want son [name], as health care POA   Would accept resuscitation   Not sure about tube feeds--but doesn't want if cognitively unaware   Social Determinants of Health   Financial Resource Strain:   . Difficulty of Paying Living Expenses: Not on file  Food Insecurity:   . Worried About Charity fundraiser in the Last Year: Not on file  . Ran Out of Food in the Last Year: Not on file  Transportation Needs:   . Lack of Transportation (Medical): Not on file  . Lack of Transportation (Non-Medical): Not on file  Physical Activity:   . Days of Exercise per Week: Not on file  . Minutes of Exercise per Session: Not on file  Stress:   . Feeling of Stress : Not on file  Social Connections:   . Frequency of Communication with Friends and Family: Not on file  . Frequency of Social Gatherings with Friends and Family: Not on file  . Attends Religious Services: Not on file  . Active  Member of Clubs or Organizations: Not on file  . Attends Archivist Meetings: Not on file  . Marital Status: Not on file  Intimate Partner Violence:   . Fear of Current or Ex-Partner: Not on file  . Emotionally Abused: Not on file  . Physically Abused: Not on file  . Sexually Abused: Not on file     Physical Exam: BP 134/68   Pulse 64   Temp 98.6 F (37 C)   Ht 5' 8.5" (1.74 m)   Wt 203 lb 2 oz (92.1 kg)   BMI 30.44 kg/m  Constitutional: generally well-appearing Psychiatric: alert and oriented x3 Abdomen: soft, nontender, nondistended, mild obvious ascites, no peritoneal signs, normal bowel sounds 1-2+ pitting edema in ankles bilaterally  Assessment and plan: 69 y.o. male with alcoholic liver disease, cirrhosis  We had a nice discussion about cirrhosis today.  He knows he should continue trying to cut back his alcohol consumption and he will work on that.  He knows that he should be trying to follow a low-salt diet.  I offered him a dietary referral however he declined.  I outlined canned foods, frozen foods, restaurant foods, junk foods as notorious sources of too much sodium.  Biggest symptom of cirrhosis right now is fluid overload.  He has 1-2+ pitting edema in his ankles and obvious ascites on exam and also by ultrasound.  He knows cutting back on his salt intake and also alcohol intake will likely help control he edema.  I am going to start him on low-dose diuretics today, Aldactone  50 mg once daily, Lasix 20 mg once daily.  He will get a basic set of labs today to calculate meld score and he will have a basic metabolic profile checked in 2 weeks.  I want to see him back in the office in 2 months. Will need to start Hep A/B vaccination series. Will have to consider repeat EGD to follow up the H. Pylori negative gastric ulcers noted 2019 as well.  Probably will round out his cirrhosis etiology workup with AMA, ASMA testing however Etoh abuse is almost certainly the  culprit here.  Please see the "Patient Instructions" section for addition details about the plan.  Owens Loffler, MD [facility] 01/08/2020, 10:05 AM   Total time on date of encounter was 31 minutes (this included time spent preparing to see the patient reviewing records; obtaining and/or reviewing separately obtained history; performing a medically appropriate exam and/or evaluation; counseling and educating the patient and family if present; ordering medications, tests or procedures if applicable; and documenting clinical information in the health record).

## 2020-01-09 LAB — AFP TUMOR MARKER: AFP-Tumor Marker: 2.3 ng/mL (ref <6.1)

## 2020-01-13 ENCOUNTER — Other Ambulatory Visit: Payer: Self-pay | Admitting: Internal Medicine

## 2020-01-18 ENCOUNTER — Telehealth: Payer: Self-pay | Admitting: Gastroenterology

## 2020-01-18 ENCOUNTER — Other Ambulatory Visit: Payer: Self-pay

## 2020-01-18 DIAGNOSIS — K7030 Alcoholic cirrhosis of liver without ascites: Secondary | ICD-10-CM

## 2020-01-18 NOTE — Telephone Encounter (Signed)
Patient called said he has one more question which is the following:  He said Dr. Ardis Hughs advised him to start taking iron pills but he does not know how much to take on a daily basis. You can leave patient detailed message with response.

## 2020-01-18 NOTE — Telephone Encounter (Signed)
The pt has been advised to take OTC iron 324 mg daily.  The pt has been advised of the information and verbalized understanding.

## 2020-02-05 DIAGNOSIS — Z0120 Encounter for dental examination and cleaning without abnormal findings: Secondary | ICD-10-CM | POA: Diagnosis not present

## 2020-02-08 ENCOUNTER — Telehealth: Payer: Self-pay | Admitting: Internal Medicine

## 2020-02-08 NOTE — Progress Notes (Signed)
  Chronic Care Management   Outreach Note  02/08/2020 Name: David [name] MRN: [id number] DOB: 1951-03-25  Referred by: [name], MD Reason for referral : No chief complaint on file.   An unsuccessful telephone outreach was attempted today. The patient was referred to the pharmacist for assistance with care management and care coordination.   Follow Up Plan:   Raynicia Dukes [employer] Scheduler

## 2020-02-08 NOTE — Chronic Care Management (AMB) (Signed)
  Chronic Care Management   Note  02/08/2020 Name: David [name] MRN: [id number] DOB: 1951/01/31  [name] is a 69 y.o. year old male who is a primary care patient of [name], MD. I reached out to [name] by phone today in response to a referral sent by [name] PCP, [name], MD.   [name] was given information about Chronic Care Management services today including:  1. CCM service includes personalized support from designated clinical staff supervised by his physician, including individualized plan of care and coordination with other care providers 2. 24/7 contact phone numbers for assistance for urgent and routine care needs. 3. Service will only be billed when office clinical staff spend 20 minutes or more in a month to coordinate care. 4. Only one practitioner may furnish and bill the service in a calendar month. 5. The patient may stop CCM services at any time (effective at the end of the month) by phone call to the office staff.   Patient agreed to services and verbal consent obtained.   Follow up plan:   Raynicia Dukes [employer] Scheduler

## 2020-02-24 ENCOUNTER — Other Ambulatory Visit: Payer: Self-pay | Admitting: Internal Medicine

## 2020-02-25 ENCOUNTER — Encounter: Payer: Self-pay | Admitting: Internal Medicine

## 2020-02-25 ENCOUNTER — Other Ambulatory Visit (INDEPENDENT_AMBULATORY_CARE_PROVIDER_SITE_OTHER): Payer: Medicare Other

## 2020-02-25 DIAGNOSIS — K7030 Alcoholic cirrhosis of liver without ascites: Secondary | ICD-10-CM | POA: Diagnosis not present

## 2020-02-25 LAB — CBC WITH DIFFERENTIAL/PLATELET
Basophils Absolute: 0 10*3/uL (ref 0.0–0.1)
Basophils Relative: 0.5 % (ref 0.0–3.0)
Eosinophils Absolute: 0.3 10*3/uL (ref 0.0–0.7)
Eosinophils Relative: 5.6 % — ABNORMAL HIGH (ref 0.0–5.0)
HCT: 30.3 % — ABNORMAL LOW (ref 39.0–52.0)
Hemoglobin: 10.1 g/dL — ABNORMAL LOW (ref 13.0–17.0)
Lymphocytes Relative: 19.5 % (ref 12.0–46.0)
Lymphs Abs: 1.2 10*3/uL (ref 0.7–4.0)
MCHC: 33.3 g/dL (ref 30.0–36.0)
MCV: 94.8 fl (ref 78.0–100.0)
Monocytes Absolute: 0.8 10*3/uL (ref 0.1–1.0)
Monocytes Relative: 14.1 % — ABNORMAL HIGH (ref 3.0–12.0)
Neutro Abs: 3.6 10*3/uL (ref 1.4–7.7)
Neutrophils Relative %: 60.3 % (ref 43.0–77.0)
Platelets: 106 10*3/uL — ABNORMAL LOW (ref 150.0–400.0)
RBC: 3.2 Mil/uL — ABNORMAL LOW (ref 4.22–5.81)
RDW: 25.4 % — ABNORMAL HIGH (ref 11.5–15.5)
WBC: 5.9 10*3/uL (ref 4.0–10.5)

## 2020-02-25 LAB — COMPREHENSIVE METABOLIC PANEL
ALT: 44 U/L (ref 0–53)
AST: 57 U/L — ABNORMAL HIGH (ref 0–37)
Albumin: 4.1 g/dL (ref 3.5–5.2)
Alkaline Phosphatase: 102 U/L (ref 39–117)
BUN: 22 mg/dL (ref 6–23)
CO2: 25 mEq/L (ref 19–32)
Calcium: 10.1 mg/dL (ref 8.4–10.5)
Chloride: 102 mEq/L (ref 96–112)
Creatinine, Ser: 1.22 mg/dL (ref 0.40–1.50)
GFR: 58.57 mL/min — ABNORMAL LOW (ref >60.00)
Glucose, Bld: 230 mg/dL — ABNORMAL HIGH (ref 70–99)
Potassium: 5 mEq/L (ref 3.5–5.1)
Sodium: 134 mEq/L — ABNORMAL LOW (ref 135–145)
Total Bilirubin: 0.9 mg/dL (ref 0.2–1.2)
Total Protein: 8.1 g/dL (ref 6.0–8.3)

## 2020-02-25 LAB — PROTIME-INR
INR: 1.1 ratio — ABNORMAL HIGH (ref 0.8–1.0)
Prothrombin Time: 12.5 s (ref 9.6–13.1)

## 2020-02-26 ENCOUNTER — Telehealth: Payer: Self-pay

## 2020-02-26 DIAGNOSIS — I2510 Atherosclerotic heart disease of native coronary artery without angina pectoris: Secondary | ICD-10-CM

## 2020-02-26 DIAGNOSIS — I10 Essential (primary) hypertension: Secondary | ICD-10-CM

## 2020-02-26 MED ORDER — OZEMPIC (0.25 OR 0.5 MG/DOSE) 2 MG/1.5ML ~~LOC~~ SOPN: 0.5000 mg | PEN_INJECTOR | SUBCUTANEOUS | 3 refills | Status: DC

## 2020-02-26 NOTE — Telephone Encounter (Signed)
Referral created.

## 2020-02-26 NOTE — Telephone Encounter (Signed)
I would like to request a referral for David [name] to chronic care management pharmacy services for the following conditions:   Essential hypertension, benign  [I10]  CAD (coronary artery disease) [I25.10]  Debbora Dus, PharmD Clinical Pharmacist [facility] [phone]

## 2020-02-28 ENCOUNTER — Telehealth: Payer: Medicare Other

## 2020-02-29 ENCOUNTER — Ambulatory Visit: Payer: Medicare Other

## 2020-02-29 ENCOUNTER — Other Ambulatory Visit: Payer: Self-pay

## 2020-02-29 DIAGNOSIS — E1165 Type 2 diabetes mellitus with hyperglycemia: Secondary | ICD-10-CM

## 2020-02-29 DIAGNOSIS — I2510 Atherosclerotic heart disease of native coronary artery without angina pectoris: Secondary | ICD-10-CM

## 2020-02-29 DIAGNOSIS — I10 Essential (primary) hypertension: Secondary | ICD-10-CM

## 2020-02-29 DIAGNOSIS — G4730 Sleep apnea, unspecified: Secondary | ICD-10-CM

## 2020-02-29 DIAGNOSIS — E785 Hyperlipidemia, unspecified: Secondary | ICD-10-CM

## 2020-02-29 DIAGNOSIS — F330 Major depressive disorder, recurrent, mild: Secondary | ICD-10-CM

## 2020-02-29 DIAGNOSIS — M159 Polyosteoarthritis, unspecified: Secondary | ICD-10-CM

## 2020-02-29 DIAGNOSIS — K7030 Alcoholic cirrhosis of liver without ascites: Secondary | ICD-10-CM

## 2020-02-29 DIAGNOSIS — N529 Male erectile dysfunction, unspecified: Secondary | ICD-10-CM

## 2020-02-29 DIAGNOSIS — E1159 Type 2 diabetes mellitus with other circulatory complications: Secondary | ICD-10-CM

## 2020-02-29 DIAGNOSIS — D649 Anemia, unspecified: Secondary | ICD-10-CM

## 2020-02-29 DIAGNOSIS — M150 Primary generalized (osteo)arthritis: Secondary | ICD-10-CM

## 2020-02-29 NOTE — Chronic Care Management (AMB) (Signed)
Chronic Care Management Pharmacy  Name: David [name]  MRN: [id number] DOB: 13-Jan-1951   Chief Complaint/ HPI  David [name],  69 y.o., male presents for their Initial CCM visit with the clinical pharmacist via telephone.  PCP : [name], MD   Their chronic conditions include: hypertension, hyperlipidemia, depressive disorder, CAD, allergic rhinitis, sleep apnea, type 2 diabetes, cirrhosis, osteoarthritis, erectile dysfunction, anemia, esophageal varices   Patient concerns: denies medication concerns  Office Visits:  12/11/19: Silvio Pate - reduce added salt  Consult Visit:  01/08/20: Gastroenterology/cirrhosis - start aldactone 50 mg daily, lasix 20 mg daily, start hepA/B series  12/18/19: Cruzita Lederer - continue current medications, resume Humalog  Allergies  Allergen Reactions  . Venlafaxine     Slowed his urine stream   Medications: Outpatient Encounter Medications as of 02/29/2020  Medication Sig  . ALPHA LIPOIC ACID PO Take 1 tablet by mouth daily.  . Ascorbic Acid (VITAMIN C) 1000 MG tablet Take 1,000 mg by mouth daily.  [name] b complex vitamins tablet Take 1 tablet by mouth daily.  . Cholecalciferol (D3 VITAMIN PO) Take 1 tablet by mouth daily.  . CHROMIUM PO Take 1 tablet by mouth daily.  . Coenzyme Q10 (CO Q 10) 100 MG CAPS Take 1 capsule by mouth daily.  . furosemide (LASIX) 20 MG tablet Take 1 tablet (20 mg total) by mouth daily.  [name] glucose blood (ONE TOUCH ULTRA TEST) test strip Use to test blood sugar 2 times daily as instructed. Dx: E11.59, E11.65  . Insulin Lispro (HUMALOG KWIKPEN) 200 UNIT/ML SOPN Inject 14-20 Units into the skin 3 (three) times daily before meals.  . insulin NPH Human (NOVOLIN N RELION) 100 UNIT/ML injection Inject under skin 30 units in a.m. and 25 to 30 units at bedtime  . Insulin Pen Needle 32G X 4 MM MISC Use 4x a day - with insulin  . Insulin Syringe-Needle U-100 (RELION INSULIN SYRINGE) 31G X 15/64" 0.5 ML MISC Inject twice a day  . losartan  (COZAAR) 50 MG tablet TAKE 1 & 1/2 (ONE & ONE-HALF) TABLETS BY MOUTH ONCE DAILY  . metFORMIN (GLUCOPHAGE) 1000 MG tablet TAKE 1 TABLET BY MOUTH TWICE DAILY WITH MEALS  . Methylsulfonylmethane (MSM PO) Take 1 tablet by mouth daily.  . Multiple Vitamin (MULTIVITAMIN WITH MINERALS) TABS tablet Take 1 tablet by mouth at bedtime.  Glory Rosebush DELICA LANCETS FINE MISC Use to test blood sugar 2 times daily as instructed. Dx: E11.59, E11.65  . pantoprazole (PROTONIX) 40 MG tablet Take 1 tablet by mouth twice daily  . PARoxetine (PAXIL) 20 MG tablet TAKE 2 TABLETS BY MOUTH ONCE DAILY FOR DEPRESSION  . propranolol (INDERAL) 20 MG tablet Take 1 tablet (20 mg total) by mouth 2 (two) times daily.  . rosuvastatin (CRESTOR) 10 MG tablet Take 1 tablet by mouth once daily  . Semaglutide,0.25 or 0.5MG /DOS, (OZEMPIC, 0.25 OR 0.5 MG/DOSE,) 2 MG/1.5ML SOPN Inject 0.5 mg into the skin once a week.  . sildenafil (VIAGRA) 100 MG tablet Take 1 tablet (100 mg total) by mouth daily as needed for erectile dysfunction.  [name] spironolactone (ALDACTONE) 50 MG tablet Take 1 tablet (50 mg total) by mouth daily.  [name] triamcinolone cream (KENALOG) 0.1 % APPLY TOPICALLY TO SCALP TWO TIMES DAILY AS NEEDED   No facility-administered encounter medications on file as of 02/29/2020.   Current Diagnosis/Assessment: Goals    . Pharmacy Care Plan     CARE PLAN ENTRY  Current Barriers:  . Chronic Disease Management support,  education, and care coordination needs related to hypertension, hyperlipidemia, depressive disorder, CAD, allergic rhinitis, sleep apnea, type 2 diabetes, cirrhosis, osteoarthritis, erectile dysfunction, anemia, esophageal varice  Pharmacist Clinical Goal(s):  [name] Improve diabetes control with goal of A1c less than 7%, fasting blood sugars 80-130 and 1-2 hours after meals less than 180. Continue to make healthy dietary choices. Choose frozen meals with at least 15 grams of protein and avoid breaded meats, rice, and pasta  based-options. Look for meals with the highest fiber and lowest sodium. Recommend setting phone alarm to help remember taking Humalog before meals.  . Remain up to date on vaccinations. Recommend shingles 2-dose series (Shingrix) from your pharmacy.  . Maintain blood pressure less than 140/90 mmHg. Recommend checking blood pressure once weekly. If elevated, please call.   Interventions: . Comprehensive medication review performed  Patient Self Care Activities:  . Self administers medications as prescribed . Self-monitors blood glucose  Initial goal documentation      Diabetes   Recent Relevant Labs: Lab Results  Component Value Date/Time   HGBA1C 8.9 (H) 12/11/2019 11:05 AM   HGBA1C 9.3 (A) 09/17/2019 10:17 AM   HGBA1C 9.9 (A) 06/11/2019 10:33 AM   HGBA1C 7.0 (H) 08/28/2018 06:45 PM   MICROALBUR 9.1 (H) 02/11/2010 10:11 AM   MICROALBUR 1.8 04/16/2009 09:52 AM    Checking BG: 2x per Day (before breakfast, before dinner)  Recent FBG Readings: low 200s throughout the day/very similar to last visit with endocrinologist per patient, did not have log with him  Patient has tried these meds in past: Ozempic (cost) 03/20-10/20 Patient is currently uncontrolled on the following medications:  Metformin 1000 mg - 1 tablet BID with meals  Insulin NPH - Inject 30 units in am and 35-40 at night (usually takes 35)   Humalog - Inject 14-20 units 15 min before a meal (usually take 14-16, if heavy meal will take 20 units)  Last diabetic eye exam:  Lab Results  Component Value Date/Time   HMDIABEYEEXA No Retinopathy 10/17/2018 12:00 AM    Last diabetic foot exam:  Lab Results  Component Value Date/Time   HMDIABFOOTEX done 11/27/2018 12:00 AM    We discussed: reports he plans to start back on Ozempic this Sunday, was able to get 30 day supply for $38 --> educated on patient assistance and pt plans to look into it; reports he occasionally forgets Humalog before meals, will take up to 30  minutes after meal if he remembers in time  Diet: reports not doing as well as he should, eating a lot of processed foods, canned foods, microwave frozen meals, as well as continuing 4-5 beers nightly   Plan: Continue current medications; Follow up with endocrinology. Plans to restart Ozempic this week. Continue to work on making healthy dietary choices. Recommend setting phone alarm to remind of Humalog before meals. Referred to patient assistance website.   Hypertension   CMP Latest Ref Rng & Units 02/25/2020 01/08/2020 12/11/2019  Glucose 70 - 99 mg/dL 230(H) 114(H) 241(H)  BUN 6 - 23 mg/dL 22 11 13   Creatinine 0.40 - 1.50 mg/dL 1.22 0.90 0.94  Sodium 135 - 145 mEq/L 134(L) 137 136  Potassium 3.5 - 5.1 mEq/L 5.0 4.7 5.3(H)  Chloride 96 - 112 mEq/L 102 105 104  CO2 19 - 32 mEq/L 25 27 27   Calcium 8.4 - 10.5 mg/dL 10.1 9.1 8.8  Total Protein 6.0 - 8.3 g/dL 8.1 7.9 7.7  Total Bilirubin 0.2 - 1.2 mg/dL 0.9 0.9 0.7  Alkaline Phos 39 - 117 U/L 102 124(H) 148(H)  AST 0 - 37 U/L 57(H) 57(H) 49(H)  ALT 0 - 53 U/L 44 34 28   Office blood pressures are: BP Readings from Last 3 Encounters:  01/08/20 134/68  12/18/19 122/70  12/11/19 124/70   Patient has failed these meds in the past: none Patient checks BP at home: infrequently, checks at grocery store Patient home BP readings are ranging: none reported, but states usually good   Patient is currently controlled on the following medications:   Losartan 50 mg - 1 and 1/2 tablet daily  Furosemide 20 mg - 1 tablet daily (per GI for swelling)  Spironolactone 50 mg - 1 tablet daily  (per GI for swelling)  Plan: Continue current medications; Recommend checking blood pressure once weekly.   Hyperlipidemia   Lipid Panel     Component Value Date/Time   CHOL 112 12/11/2019 1105   TRIG 65.0 12/11/2019 1105   HDL 46.60 12/11/2019 1105   CHOLHDL 2 12/11/2019 1105   VLDL 13.0 12/11/2019 1105   LDLCALC 52 12/11/2019 1105   LDLDIRECT 103.9  11/05/2013 1249     LDL goal < 70 (CAD) Patient has failed these meds in past: none Patient is currently controlled on the following medications:   Rosuvastatin 10 mg - 1 tablet daily  We discussed: confirms adherence, denies adverse effects  Plan: Continue current medications  Cirrhosis of liver   Followed by Owens Loffler Patient has tried these meds in past: Nadolol (unknown) Patient is currently controlled on the following medications:   Spironolactone 50 mg - 1 tablet daily  Propranolol 20 mg - 1 tablet BID  Furosemide 20 mg - 1 tablet daily  We discussed: kidney function has declined since starting diuretics, denies any swelling; reports follow up soon with GI  Plan: Continue current medications   Depression   Patient has failed these meds in past: none  Patient is currently controlled on the following medications:   Paroxetine 20 mg - 2 tablets daily (AM)  We discussed: working well  Plan: Continue current medications   Erectile Dysfunction   Patient has failed these meds in past: none reported Patient is currently controlled on the following medications:   Sildenafil 100 mg - 1 tablet PRN  We discussed: confirms still taking, sometimes doesn't work as well as others, takes on empty stomach, takes 1 tablet as needed  Plan: Continue current medications  Vaccines   Reviewed and discussed patient's vaccination history.    Immunization History  Administered Date(s) Administered  . Fluad Quad(high Dose 65+) 08/07/2019  . Influenza Split 08/30/2011, 09/18/2012  . Influenza Whole 08/21/2008, 10/02/2009, 08/18/2010  . Influenza, High Dose Seasonal PF 08/29/2018  . Influenza,inj,Quad PF,6+ Mos 11/05/2013, 09/12/2015, 08/27/2016, 09/02/2017  . Pneumococcal Conjugate-13 10/09/2014  . Pneumococcal Polysaccharide-23 02/11/2010, 01/18/2017  . Td 11/29/2002  . Tdap 11/05/2013   Plan: Recommend patient receive Shingrix 2-dose series.    Medication  Management  Misc ( no diagnosis): pantoprazole 40 mg - BID  OTCs: OTC iron 324 mg daily - denies constipation, vitamin C 1000 mg, alpha lipoic acid - antioxidant, b complex, vitamin D3, chromium - blood sugar, coq10 100 mg, MSM - joints, triamcinolone cream - PRN psoriasis, multivitamin daily  Pharmacy/Benefits: [employer]/[facility]   Adherence: some difficulty remembering Humalog dose with meals  Affordability: may be interested in PAP for Ozempic  CCM Follow Up: 05/22/20 at 11:00 AM (telephone)  Debbora Dus, PharmD Clinical Pharmacist [facility] at  [place] [phone]

## 2020-02-29 NOTE — Patient Instructions (Addendum)
February 29, 2020  Dear David [name],  It was a pleasure meeting you during our initial appointment on February 29, 2020. Below is a summary of the goals we discussed and components of chronic care management. Please contact me anytime with questions or concerns.   Visit Information  Goals    . Pharmacy Care Plan     CARE PLAN ENTRY  Current Barriers:  . Chronic Disease Management support, education, and care coordination needs related to hypertension, hyperlipidemia, depressive disorder, CAD, allergic rhinitis, sleep apnea, type 2 diabetes, cirrhosis, osteoarthritis, erectile dysfunction, anemia, esophageal varice  Pharmacist Clinical Goal(s):  [name] Improve diabetes control with goal of A1c less than 7%, fasting blood sugars 80-130 and 1-2 hours after meals less than 180. Continue to make healthy dietary choices. Choose frozen meals with at least 15 grams of protein and avoid breaded meats, rice, and pasta based-options. Look for meals with the highest fiber and lowest sodium. Recommend setting phone alarm to help remember taking Humalog before meals.  . Reduce medication cost burden. Call [phone] or visit www.novocare.com/ozempic/let-us-help/pap.html for information regarding patient assistance. Please call if you would like help with the application process.  . Remain up to date on vaccinations. Recommend shingles 2-dose series (Shingrix) from your pharmacy.  . Maintain blood pressure less than 140/90 mmHg. Recommend checking blood pressure once weekly. If elevated, please call.   Interventions: . Comprehensive medication review performed  Patient Self Care Activities:  . Self administers medications as prescribed . Self-monitors blood glucose  Initial goal documentation       David [name] was given information about Chronic Care Management services today including:  1. CCM service includes personalized support from designated clinical staff supervised by his physician, including  individualized plan of care and coordination with other care providers 2. 24/7 contact phone numbers for assistance for urgent and routine care needs. 3. Standard insurance, coinsurance, copays and deductibles apply for chronic care management only during months in which we provide at least 20 minutes of these services. Most insurances cover these services at 100%, however patients may be responsible for any copay, coinsurance and/or deductible if applicable. This service may help you avoid the need for more expensive face-to-face services. 4. Only one practitioner may furnish and bill the service in a calendar month. 5. The patient may stop CCM services at any time (effective at the end of the month) by phone call to the office staff.  Patient agreed to services and verbal consent obtained.   The patient verbalized understanding of instructions provided today and agreed to receive a mailed copy of patient instruction and/or educational materials. Telephone follow up appointment with pharmacy team member scheduled for: 05/22/20 at 11:00 AM (telephone - to assess diabetes control/diet); Please call my number below if this date and time does not work for you.  Debbora Dus, PharmD Clinical Pharmacist [facility] [phone]   Diabetes Mellitus and Nutrition, Adult When you have diabetes (diabetes mellitus), it is very important to have healthy eating habits because your blood sugar (glucose) levels are greatly affected by what you eat and drink. Eating healthy foods in the appropriate amounts, at about the same times every day, can help you:  Control your blood glucose.  Lower your risk of heart disease.  Improve your blood pressure.  Reach or maintain a healthy weight. Every person with diabetes is different, and each person has different needs for a meal plan. Your health care provider may recommend that you  work with a diet and nutrition specialist (dietitian)  to make a meal plan that is best for you. Your meal plan may vary depending on factors such as:  The calories you need.  The medicines you take.  Your weight.  Your blood glucose, blood pressure, and cholesterol levels.  Your activity level.  Other health conditions you have, such as heart or kidney disease. How do carbohydrates affect me? Carbohydrates, also called carbs, affect your blood glucose level more than any other type of food. Eating carbs naturally raises the amount of glucose in your blood. Carb counting is a method for keeping track of how many carbs you eat. Counting carbs is important to keep your blood glucose at a healthy level, especially if you use insulin or take certain oral diabetes medicines. It is important to know how many carbs you can safely have in each meal. This is different for every person. Your dietitian can help you calculate how many carbs you should have at each meal and for each snack. Foods that contain carbs include:  Bread, cereal, rice, pasta, and crackers.  Potatoes and corn.  Peas, beans, and lentils.  Milk and yogurt.  Fruit and juice.  Desserts, such as cakes, cookies, ice cream, and candy. How does alcohol affect me? Alcohol can cause a sudden decrease in blood glucose (hypoglycemia), especially if you use insulin or take certain oral diabetes medicines. Hypoglycemia can be a life-threatening condition. Symptoms of hypoglycemia (sleepiness, dizziness, and confusion) are similar to symptoms of having too much alcohol. If your health care provider says that alcohol is safe for you, follow these guidelines:  Limit alcohol intake to no more than 1 drink per day for nonpregnant women and 2 drinks per day for men. One drink equals 12 oz of beer, 5 oz of wine, or 1 oz of hard liquor.  Do not drink on an empty stomach.  Keep yourself hydrated with water, diet soda, or unsweetened iced tea.  Keep in mind that regular soda, juice, and other  mixers may contain a lot of sugar and must be counted as carbs. What are tips for following this plan?  Reading food labels  Start by checking the serving size on the "Nutrition Facts" label of packaged foods and drinks. The amount of calories, carbs, fats, and other nutrients listed on the label is based on one serving of the item. Many items contain more than one serving per package.  Check the total grams (g) of carbs in one serving. You can calculate the number of servings of carbs in one serving by dividing the total carbs by 15. For example, if a food has 30 g of total carbs, it would be equal to 2 servings of carbs.  Check the number of grams (g) of saturated and trans fats in one serving. Choose foods that have low or no amount of these fats.  Check the number of milligrams (mg) of salt (sodium) in one serving. Most people should limit total sodium intake to less than 2,300 mg per day.  Always check the nutrition information of foods labeled as "low-fat" or "nonfat". These foods may be higher in added sugar or refined carbs and should be avoided.  Talk to your dietitian to identify your daily goals for nutrients listed on the label. Shopping  Avoid buying canned, premade, or processed foods. These foods tend to be high in fat, sodium, and added sugar.  Shop around the outside edge of the grocery store. This includes  fresh fruits and vegetables, bulk grains, fresh meats, and fresh dairy. Cooking  Use low-heat cooking methods, such as baking, instead of high-heat cooking methods like deep frying.  Cook using healthy oils, such as olive, canola, or sunflower oil.  Avoid cooking with butter, cream, or high-fat meats. Meal planning  Eat meals and snacks regularly, preferably at the same times every day. Avoid going long periods of time without eating.  Eat foods high in fiber, such as fresh fruits, vegetables, beans, and whole grains. Talk to your dietitian about how many servings  of carbs you can eat at each meal.  Eat 4-6 ounces (oz) of lean protein each day, such as lean meat, chicken, fish, eggs, or tofu. One oz of lean protein is equal to: ? 1 oz of meat, chicken, or fish. ? 1 egg. ?  cup of tofu.  Eat some foods each day that contain healthy fats, such as avocado, nuts, seeds, and fish. Lifestyle  Check your blood glucose regularly.  Exercise regularly as told by your health care provider. This may include: ? 150 minutes of moderate-intensity or vigorous-intensity exercise each week. This could be brisk walking, biking, or water aerobics. ? Stretching and doing strength exercises, such as yoga or weightlifting, at least 2 times a week.  Take medicines as told by your health care provider.  Do not use any products that contain nicotine or tobacco, such as cigarettes and e-cigarettes. If you need help quitting, ask your health care provider.  Work with a Social worker or diabetes educator to identify strategies to manage stress and any emotional and social challenges. Questions to ask a health care provider  Do I need to meet with a diabetes educator?  Do I need to meet with a dietitian?  What number can I call if I have questions?  When are the best times to check my blood glucose? Where to find more information:  American Diabetes Association: diabetes.org  Academy of Nutrition and Dietetics: www.eatright.[employer] of Diabetes and Digestive and Kidney Diseases (NIH): [employer] Summary  A healthy meal plan will help you control your blood glucose and maintain a healthy lifestyle.  Working with a diet and nutrition specialist (dietitian) can help you make a meal plan that is best for you.  Keep in mind that carbohydrates (carbs) and alcohol have immediate effects on your blood glucose levels. It is important to count carbs and to use alcohol carefully. This information is not intended to replace advice given to you by your  health care provider. Make sure you discuss any questions you have with your health care provider. Document Revised: 10/28/2017 Document Reviewed: 12/20/2016 Elsevier Patient Education  2020 [employer].

## 2020-03-04 ENCOUNTER — Ambulatory Visit: Payer: Medicare Other | Admitting: Gastroenterology

## 2020-03-04 ENCOUNTER — Encounter: Payer: Self-pay | Admitting: Gastroenterology

## 2020-03-04 VITALS — BP 110/70 | HR 70 | Temp 98.6°F | Ht 69.0 in | Wt 186.0 lb

## 2020-03-04 DIAGNOSIS — K259 Gastric ulcer, unspecified as acute or chronic, without hemorrhage or perforation: Secondary | ICD-10-CM

## 2020-03-04 DIAGNOSIS — K7030 Alcoholic cirrhosis of liver without ascites: Secondary | ICD-10-CM

## 2020-03-04 NOTE — Progress Notes (Signed)
Review of pertinent gastrointestinal problems: 1. Cirrhosis, etoh related, diagnosed 2019.  Long history of alcohol abuse: beer, bourbon. Workup 2019 labs ANA negative, normal iron studies, Hep C Ab negative, Hep B Surface Ab negative, Hep B Surface Ag negative, Hep A total Ab negative.   Imaging: [place] October 2019 showed nodule lower liver.  No focal lesions.  Ultrasound January 2021 no focal liver lesion.  + Mild to moderate ascites.  EGD 08/2018: Grade II distal esophagus varices, several small distal gastric ulcers (path H. Pylori negative). Started on nadolol 20mg  daily, changed to propranolol 20 BID at some point.  Still drinking February 2021  Meld score 8 based on February 2021 labs 2.  History of adenomatous colon polyps.  Colonoscopy 2012 2 subcentimeter adenomas removed.  Colonoscopy 12/2017 normal examination.  Recall recommended 10 years.   HPI: This is a very pleasant 69 year old man   I saw him about 2 months ago.  His weight was 203 pounds.  That was the first visit in about a year and a half or so.  He was still continuing to drink although a bit less than his usual.  His biggest symptom was fluid overload, he had 1-2+ pitting edema in his ankles and obvious ascites on exam and also by ultrasound.  I recommended cutting back his salt intake and I started him on low-dose diuretics.  He had a repeat set of labs February 2021, calculated meld score of 8 based on those labs.  His hemoglobin was a bit worse than his usual and so I recommended he start taking an iron supplement once daily as well as multivitamin once daily.  Repeat labs last week showed a nice improvement of his hemoglobin to 10.1.  Sodium 134, potassium 5.0, creatinine 1.2.  He needs upper endoscopy to follow-up his several small distal gastric ulcers H. pylori negative that were noted by EGD October 2019.  He also needs to start hepatitis A and B immunization series.  Repeat BMET in 2 months, OV in 4 months.  He is  down 17 pounds since his visit 2 months ago based on same scale here in our office.  He is doing overall very well.  He has been taking an iron supplement once daily, he started his low-dose diuretics 2 months ago.  He is very happy with the decrease in his abdominal girth and lower extremity edema.  He also cut back on salty foods.  He still does drink about a sixpack of beer 4 days/week.  He is working on this and knows it is important that he cut back even further.  He has made quite a lot of progress from what he used to be drinking.  I think the summertime might be harder for him since he likes cold beers on hot days.   ROS: complete GI ROS as described in HPI, all other review negative.  Constitutional:  No unintentional weight loss   Past Medical History:  Diagnosis Date  . Allergy   . Anemia   . Arthritis   . CAD (coronary artery disease)    s/p bifurcation stenting (DES x2) and stenting bifurcation lesion distal RCA (1 DES)  . Cataract   . Depression   . Diabetes mellitus   . GERD (gastroesophageal reflux disease)   . Hyperlipemia   . Hypertension   . Myocardial infarction ([place])   . Sleep apnea    CPAP    Past Surgical History:  Procedure Laterality Date  . BIOPSY  09/01/2018   Procedure: BIOPSY;  Surgeon: Mauri Pole, MD;  Location: [facility];  Service: Endoscopy;;  . CATARACT EXTRACTION Bilateral   . CORONARY ANGIOPLASTY WITH STENT PLACEMENT   March 2010   Dr. Olevia Perches  . ESOPHAGOGASTRODUODENOSCOPY (EGD) WITH PROPOFOL N/A 09/01/2018   Procedure: ESOPHAGOGASTRODUODENOSCOPY (EGD) WITH PROPOFOL;  Surgeon: Mauri Pole, MD;  Location: [facility];  Service: Endoscopy;  Laterality: N/A;  . LEFT HEART CATH AND CORONARY ANGIOGRAPHY N/A 08/31/2018   Procedure: LEFT HEART CATH AND CORONARY ANGIOGRAPHY;  Surgeon: Sherren Mocha, MD;  Location: [facility];  Service: Cardiovascular;  Laterality: N/A;  . TONSILLECTOMY  1959    Current Outpatient  Medications  Medication Sig Dispense Refill  . ALPHA LIPOIC ACID PO Take 1 tablet by mouth daily.    . Ascorbic Acid (VITAMIN C) 1000 MG tablet Take 1,000 mg by mouth daily.    [name] b complex vitamins tablet Take 1 tablet by mouth daily.    . Cholecalciferol (D3 VITAMIN PO) Take 1 tablet by mouth daily.    . CHROMIUM PO Take 1 tablet by mouth daily.    . Coenzyme Q10 (CO Q 10) 100 MG CAPS Take 1 capsule by mouth daily.    . ferrous sulfate 325 (65 FE) MG tablet Take 325 mg by mouth daily with breakfast.    . furosemide (LASIX) 20 MG tablet Take 1 tablet (20 mg total) by mouth daily. 30 tablet 6  . glucose blood (ONE TOUCH ULTRA TEST) test strip Use to test blood sugar 2 times daily as instructed. Dx: E11.59, E11.65 100 each 5  . Insulin Lispro (HUMALOG KWIKPEN) 200 UNIT/ML SOPN Inject 14-20 Units into the skin 3 (three) times daily before meals. 5 pen 5  . insulin NPH Human (NOVOLIN N RELION) 100 UNIT/ML injection Inject under skin 30 units in a.m. and 25 to 30 units at bedtime 20 mL 11  . Insulin Pen Needle 32G X 4 MM MISC Use 4x a day - with insulin 200 each 3  . Insulin Syringe-Needle U-100 (RELION INSULIN SYRINGE) 31G X 15/64" 0.5 ML MISC Inject twice a day 200 each 3  . losartan (COZAAR) 50 MG tablet TAKE 1 & 1/2 (ONE & ONE-HALF) TABLETS BY MOUTH ONCE DAILY 135 tablet 3  . metFORMIN (GLUCOPHAGE) 1000 MG tablet TAKE 1 TABLET BY MOUTH TWICE DAILY WITH MEALS 180 tablet 0  . Methylsulfonylmethane (MSM PO) Take 1 tablet by mouth daily.    . Multiple Vitamin (MULTIVITAMIN WITH MINERALS) TABS tablet Take 1 tablet by mouth at bedtime.    Glory Rosebush DELICA LANCETS FINE MISC Use to test blood sugar 2 times daily as instructed. Dx: E11.59, E11.65 100 each 5  . pantoprazole (PROTONIX) 40 MG tablet Take 1 tablet by mouth twice daily 180 tablet 1  . PARoxetine (PAXIL) 20 MG tablet TAKE 2 TABLETS BY MOUTH ONCE DAILY FOR DEPRESSION 180 tablet 0  . propranolol (INDERAL) 20 MG tablet Take 1 tablet (20 mg  total) by mouth 2 (two) times daily. 180 tablet 1  . rosuvastatin (CRESTOR) 10 MG tablet Take 1 tablet by mouth once daily 90 tablet 3  . Semaglutide,0.25 or 0.5MG /DOS, (OZEMPIC, 0.25 OR 0.5 MG/DOSE,) 2 MG/1.5ML SOPN Inject 0.5 mg into the skin once a week. 1 pen 3  . sildenafil (VIAGRA) 100 MG tablet Take 1 tablet (100 mg total) by mouth daily as needed for erectile dysfunction. 10 tablet 5  . spironolactone (ALDACTONE) 50 MG tablet Take 1 tablet (50  mg total) by mouth daily. 30 tablet 6  . triamcinolone cream (KENALOG) 0.1 % APPLY TOPICALLY TO SCALP TWO TIMES DAILY AS NEEDED 30 g 0   No current facility-administered medications for this visit.    Allergies as of 03/04/2020 - Review Complete 03/04/2020  Allergen Reaction Noted  . Venlafaxine  07/09/2014    Family History  Problem Relation Age of Onset  . Heart failure Father        Deceased 40 y/o  . Diabetes Father   . Cancer Father        Prostate  . Heart failure Mother        Deceased 4 y/o  . Alcohol abuse Mother   . Schizophrenia Son        Paranoid  . Uveitis Son   . Cancer Brother        prostate  . Colon cancer Neg Hx   . Esophageal cancer Neg Hx   . Stomach cancer Neg Hx   . Liver cancer Neg Hx   . Pancreatic cancer Neg Hx   . Rectal cancer Neg Hx     Social History   Socioeconomic History  . Marital status: Legally Separated    Spouse name: Not on file  . Number of children: 2  . Years of education: Not on file  . Highest education level: Not on file  Occupational History  . Occupation: Patent examiner buildings    Comment: site closed  . Occupation: Engineering geologist    Comment: part time  Tobacco Use  . Smoking status: Former Smoker    Types: Cigarettes    Quit date: 11/30/1983    Years since quitting: 36.2  . Smokeless tobacco: Never Used  Substance and Sexual Activity  . Alcohol use: Yes    Alcohol/week: 20.0 standard drinks    Types: 20 Standard drinks or equivalent per week     Comment: beer  . Drug use: No  . Sexual activity: Not on file  Other Topics Concern  . Not on file  Social History Narrative   No living will   Would want want son [name], as health care POA   Would accept resuscitation   Not sure about tube feeds--but doesn't want if cognitively unaware   Social Determinants of Health   Financial Resource Strain:   . Difficulty of Paying Living Expenses:   Food Insecurity:   . Worried About Charity fundraiser in the Last Year:   . Arboriculturist in the Last Year:   Transportation Needs:   . Film/video editor (Medical):   [name] Lack of Transportation (Non-Medical):   Physical Activity:   . Days of Exercise per Week:   . Minutes of Exercise per Session:   Stress:   . Feeling of Stress :   Social Connections:   . Frequency of Communication with Friends and Family:   . Frequency of Social Gatherings with Friends and Family:   . Attends Religious Services:   . Active Member of Clubs or Organizations:   . Attends Archivist Meetings:   [name] Marital Status:   Intimate Partner Violence:   . Fear of Current or Ex-Partner:   . Emotionally Abused:   [name] Physically Abused:   . Sexually Abused:      Physical Exam: BP 110/70   Pulse 70   Temp 98.6 F (37 C)   Ht 5\' 9"  (1.753 m)   Wt 186 lb (84.4 kg)  BMI 27.47 kg/m  Constitutional: generally well-appearing Psychiatric: alert and oriented x3 Abdomen: soft, nontender, nondistended, no obvious ascites, no peritoneal signs, normal bowel sounds No peripheral edema noted in lower extremities  Assessment and plan: 69 y.o. male with alcoholic cirrhosis  First he has responded very well to the low-dose diuretics which were started 2 months ago.  His weight is down 17 pounds.  His edema is gone in his ankles and in his abdominal girth.  We will continue those medicines for now.  He will have a repeat basic metabolic profile as well as a CBC in about 2 months time.  He needs to be immunized  for hepatitis A and B.  We will do that series starting in 1 month from now since he is getting his second Covid vaccination today.  He needs follow-up for small gastric ulcers noted 2019 by upper endoscopy with a repeat EGD.  We will arrange for that to be done in about 1 month now as well.  He return to see me in 4 months and sooner if any issues.  Please see the "Patient Instructions" section for addition details about the plan.  Owens Loffler, MD [facility] 03/04/2020, 2:23 PM   Total time on date of encounter was 30 minutes (this included time spent preparing to see the patient reviewing records; obtaining and/or reviewing separately obtained history; performing a medically appropriate exam and/or evaluation; counseling and educating the patient and family if present; ordering medications, tests or procedures if applicable; and documenting clinical information in the health record).

## 2020-03-04 NOTE — Patient Instructions (Addendum)
If you are age 71 or older, your body mass index should be between 23-30. Your Body mass index is 27.47 kg/m. If this is out of the aforementioned range listed, please consider follow up with your Primary Care Provider.  If you are age 59 or younger, your body mass index should be between 19-25. Your Body mass index is 27.47 kg/m. If this is out of the aformentioned range listed, please consider follow up with your Primary Care Provider.   You have been scheduled for an endoscopy. Please follow written instructions given to you at your visit today. If you use inhalers (even only as needed), please bring them with you on the day of your procedure.   Your provider has requested that you go to the basement level for lab work in 2 months (June 2021). Press "B" on the elevator. The lab is located at the first door on the left as you exit the elevator.  You will return to office for Hep A and Hep B vaccine (Twinrix) on 04-02-20 at 9:00am.  You will return to office for follow up appointment in August 2021.  Thank you for entrusting me with your care and choosing [facility].  Dr Ardis Hughs

## 2020-03-14 ENCOUNTER — Other Ambulatory Visit: Payer: Self-pay

## 2020-03-18 ENCOUNTER — Encounter: Payer: Self-pay | Admitting: Internal Medicine

## 2020-03-18 ENCOUNTER — Ambulatory Visit: Payer: Medicare Other | Admitting: Internal Medicine

## 2020-03-18 ENCOUNTER — Other Ambulatory Visit: Payer: Self-pay

## 2020-03-18 VITALS — BP 140/78 | HR 76 | Ht 68.5 in | Wt 186.0 lb

## 2020-03-18 DIAGNOSIS — E1165 Type 2 diabetes mellitus with hyperglycemia: Secondary | ICD-10-CM | POA: Diagnosis not present

## 2020-03-18 DIAGNOSIS — E1159 Type 2 diabetes mellitus with other circulatory complications: Secondary | ICD-10-CM | POA: Diagnosis not present

## 2020-03-18 DIAGNOSIS — E785 Hyperlipidemia, unspecified: Secondary | ICD-10-CM

## 2020-03-18 DIAGNOSIS — E663 Overweight: Secondary | ICD-10-CM

## 2020-03-18 LAB — POCT GLYCOSYLATED HEMOGLOBIN (HGB A1C): Hemoglobin A1C: 7.3 % — AB (ref 4.0–5.6)

## 2020-03-18 NOTE — Addendum Note (Signed)
Addended by: Cardell Peach I on: 03/18/2020 11:15 AM   Modules accepted: Orders

## 2020-03-18 NOTE — Progress Notes (Signed)
Patient ID: David [name], male   DOB: [birth year], 69 y.o.   MRN: [id number]   This visit occurred during the SARS-CoV-2 public health emergency.  Safety protocols were in place, including screening questions prior to the visit, additional usage of staff PPE, and extensive cleaning of exam room while observing appropriate contact time as indicated for disinfecting solutions.   HPI: David [name] is a 69 y.o.-year-old male, returning for f/u for DM2, dx in ~2008, insulin-dependent since 09/2015, uncontrolled, with complications (CAD - s/p stents in 2013, PN). Last visit 3 months ago.  Since last visit, he was diagnosed with cirrhosis.  He has anemia and decreased platelets.  He and his wife for 40 years separated and at last visit he was living outside of his home.  His eating habits were poor and he continues to drink alcohol.  Sugars were still high.    Reviewed HbA1c levels: Lab Results  Component Value Date   HGBA1C 8.9 (H) 12/11/2019   HGBA1C 9.3 (A) 09/17/2019   HGBA1C 9.9 (A) 06/11/2019  03/05/2016: HbA1c 9.1%  He is on: - Metformin 1000 mg 2x a day, with meals - Lantus 40 >> 48 units at bedtime >> NPH 30 units in a.m. and 35 to 40 units at night - Humalog 8-12 >> 16 >> 14-20 15 min before a meal - 14 with B + 0 units with L + 16 units with B - Ozempic 0.5 mg weekly He was on Ozempic from 01/2019 to 08/2019 but had to come off due to price.  We restarted this in 01/2020. He was on Bydureon 2 mg weekly - started 01/30/2017 >> stopped 04/2017 b/c price - could not restart  Pt checks his sugars 2x a day for review of his log: - am: 157-322, 363 >> 208-287, 300, 329 >> 95-240, 261, 300 >> 138-253 - 2h after b'fast: n/c  - before lunch: 118-210 >> 85-187, 201 >> 158, 327 >> n/c >> n/c - 2h after lunch: n/c - before dinner:68, 119-265 >> 200-360 >> 164-280, 300 >> 79-206, 235 >> 121-185, 229 - 2h after dinner: n/c - bedtime:  126-209, 225-240 (candy), 261 >> 160-215 >> n/c - nighttime:  n/c Lowest sugar was 164 >> 79 >> 90; it is unclear at which level he has hypoglycemia awareness. Highest sugar was 329 >> 261 >> 253.  Glucometer: One Touch  Pt's meals are: - Breakfast: protein shake - Lunch: n/a - Dinner: meat + veggies /potatoes or salad - Snacks: no Diet sodas, ice tea with splenda.  -No, last BUN/creatinine:  Lab Results  Component Value Date   BUN 22 02/25/2020   CREATININE 1.22 02/25/2020  On losartan. -+ HL; last set of lipids: Lab Results  Component Value Date   CHOL 112 12/11/2019   HDL 46.60 12/11/2019   LDLCALC 52 12/11/2019   LDLDIRECT 103.9 11/05/2013   TRIG 65.0 12/11/2019   CHOLHDL 2 12/11/2019  On Crestor. - last eye exam was in 11/2019: No DR.  Has previous history of cataract surgery. -He has numbness and tingling in his feet  He also has HTN, depression.  He has a history of anabolic steroid use for muscle building.  He had a cardiac cath 08/2018 for presyncope: Stable coronary anatomy without high-grade coronary stenosis.  He is on medical therapy for CAD.  He was found to be anemic (Hb 8.1) >> EGD report reviewed:  - Non-bleeding grade II esophageal varices. - Non-bleeding gastric ulcers with a clean ulcer base (Forrest Class  III). Biopsied. - Erythematous duodenopathy. - Normal second portion of the duodenum.  ROS: Constitutional: no weight gain/+ weight loss, no fatigue, no subjective hyperthermia, no subjective hypothermia Eyes: no blurry vision, no xerophthalmia ENT: no sore throat, no nodules palpated in neck, no dysphagia, no odynophagia, no hoarseness Cardiovascular: no CP/no SOB/no palpitations/no leg swelling Respiratory: no cough/no SOB/no wheezing Gastrointestinal: no N/no V/no D/no C/no acid reflux Musculoskeletal: no muscle aches/no joint aches Skin: no rashes, no hair loss Neurological: + tremors/+ numbness/+ tingling/no dizziness  I reviewed pt's medications, allergies, PMH, social hx, family hx, and changes  were documented in the history of present illness. Otherwise, unchanged from my initial visit note.   Past Medical History:  Diagnosis Date  . Allergy   . Anemia   . Arthritis   . CAD (coronary artery disease)    s/p bifurcation stenting (DES x2) and stenting bifurcation lesion distal RCA (1 DES)  . Cataract   . Depression   . Diabetes mellitus   . GERD (gastroesophageal reflux disease)   . Hyperlipemia   . Hypertension   . Myocardial infarction ([place])   . Sleep apnea    CPAP   Past Surgical History:  Procedure Laterality Date  . BIOPSY  09/01/2018   Procedure: BIOPSY;  Surgeon: Mauri Pole, MD;  Location: [facility];  Service: Endoscopy;;  . CATARACT EXTRACTION Bilateral   . CORONARY ANGIOPLASTY WITH STENT PLACEMENT   March 2010   Dr. Olevia Perches  . ESOPHAGOGASTRODUODENOSCOPY (EGD) WITH PROPOFOL N/A 09/01/2018   Procedure: ESOPHAGOGASTRODUODENOSCOPY (EGD) WITH PROPOFOL;  Surgeon: Mauri Pole, MD;  Location: [facility];  Service: Endoscopy;  Laterality: N/A;  . LEFT HEART CATH AND CORONARY ANGIOGRAPHY N/A 08/31/2018   Procedure: LEFT HEART CATH AND CORONARY ANGIOGRAPHY;  Surgeon: Sherren Mocha, MD;  Location: [facility];  Service: Cardiovascular;  Laterality: N/A;  . TONSILLECTOMY  1959   Social History   Social History  . Marital Status: Married    Spouse Name: N/A  . Number of Children: 2   Occupational History  . 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 Smoking status: Former Smoker    Types: Cigarettes    Quit date: 11/30/1983  . Smokeless tobacco: Never Used  . Alcohol Use:  beer, 3-4 times a week, 6-8 drinks at the time       . Drug Use: No   Social History Narrative   No living will   Would want wife, then son David [name], as health care POA   Would accept resuscitation   Not sure about tube feeds   Current Outpatient Medications on File Prior to Visit  Medication Sig Dispense  Refill  . ALPHA LIPOIC ACID PO Take 1 tablet by mouth daily.    . Ascorbic Acid (VITAMIN C) 1000 MG tablet Take 1,000 mg by mouth daily.    [name] b complex vitamins tablet Take 1 tablet by mouth daily.    . Cholecalciferol (D3 VITAMIN PO) Take 1 tablet by mouth daily.    . CHROMIUM PO Take 1 tablet by mouth daily.    . Coenzyme Q10 (CO Q 10) 100 MG CAPS Take 1 capsule by mouth daily.    . ferrous sulfate 325 (65 FE) MG tablet Take 325 mg by mouth daily with breakfast.    . furosemide (LASIX) 20 MG tablet Take 1 tablet (20 mg total) by mouth daily. 30 tablet  6  . glucose blood (ONE TOUCH ULTRA TEST) test strip Use to test blood sugar 2 times daily as instructed. Dx: E11.59, E11.65 100 each 5  . Insulin Lispro (HUMALOG KWIKPEN) 200 UNIT/ML SOPN Inject 14-20 Units into the skin 3 (three) times daily before meals. 5 pen 5  . insulin NPH Human (NOVOLIN N RELION) 100 UNIT/ML injection Inject under skin 30 units in a.m. and 25 to 30 units at bedtime 20 mL 11  . Insulin Pen Needle 32G X 4 MM MISC Use 4x a day - with insulin 200 each 3  . Insulin Syringe-Needle U-100 (RELION INSULIN SYRINGE) 31G X 15/64" 0.5 ML MISC Inject twice a day 200 each 3  . losartan (COZAAR) 50 MG tablet TAKE 1 & 1/2 (ONE & ONE-HALF) TABLETS BY MOUTH ONCE DAILY 135 tablet 3  . metFORMIN (GLUCOPHAGE) 1000 MG tablet TAKE 1 TABLET BY MOUTH TWICE DAILY WITH MEALS 180 tablet 0  . Methylsulfonylmethane (MSM PO) Take 1 tablet by mouth daily.    . Multiple Vitamin (MULTIVITAMIN WITH MINERALS) TABS tablet Take 1 tablet by mouth at bedtime.    Glory Rosebush DELICA LANCETS FINE MISC Use to test blood sugar 2 times daily as instructed. Dx: E11.59, E11.65 100 each 5  . pantoprazole (PROTONIX) 40 MG tablet Take 1 tablet by mouth twice daily 180 tablet 1  . PARoxetine (PAXIL) 20 MG tablet TAKE 2 TABLETS BY MOUTH ONCE DAILY FOR DEPRESSION 180 tablet 0  . propranolol (INDERAL) 20 MG tablet Take 1 tablet (20 mg total) by mouth 2 (two) times daily. 180  tablet 1  . rosuvastatin (CRESTOR) 10 MG tablet Take 1 tablet by mouth once daily 90 tablet 3  . Semaglutide,0.25 or 0.5MG /DOS, (OZEMPIC, 0.25 OR 0.5 MG/DOSE,) 2 MG/1.5ML SOPN Inject 0.5 mg into the skin once a week. 1 pen 3  . sildenafil (VIAGRA) 100 MG tablet Take 1 tablet (100 mg total) by mouth daily as needed for erectile dysfunction. 10 tablet 5  . spironolactone (ALDACTONE) 50 MG tablet Take 1 tablet (50 mg total) by mouth daily. 30 tablet 6  . triamcinolone cream (KENALOG) 0.1 % APPLY TOPICALLY TO SCALP TWO TIMES DAILY AS NEEDED 30 g 0   No current facility-administered medications on file prior to visit.   Allergies  Allergen Reactions  . Venlafaxine     Slowed his urine stream   Family History  Problem Relation Age of Onset  . Heart failure Father        Deceased 51 y/o  . Diabetes Father   . Cancer Father        Prostate  . Heart failure Mother        Deceased 51 y/o  . Alcohol abuse Mother   . Schizophrenia Son        Paranoid  . Uveitis Son   . Cancer Brother        prostate  . Colon cancer Neg Hx   . Esophageal cancer Neg Hx   . Stomach cancer Neg Hx   . Liver cancer Neg Hx   . Pancreatic cancer Neg Hx   . Rectal cancer Neg Hx    PE: BP 140/78   Pulse 76   Ht 5' 8.5" (1.74 m)   Wt 186 lb (84.4 kg)   SpO2 96%   BMI 27.87 kg/m  Body mass index is 27.87 kg/m. Wt Readings from Last 3 Encounters:  03/18/20 186 lb (84.4 kg)  03/04/20 186 lb (84.4 kg)  01/08/20 203  lb 2 oz (92.1 kg)   Constitutional: overweight, in NAD Eyes: PERRLA, EOMI, no exophthalmos ENT: moist mucous membranes, no thyromegaly, no cervical lymphadenopathy Cardiovascular: RRR, No MRG, + pitting edema B up to knees Respiratory: CTA B Gastrointestinal: abdomen soft, NT, ND, BS+ Musculoskeletal: no deformities, strength intact in all 4 Skin: moist, warm, no rashes Neurological: + tremor with outstretched hands, DTR normal in all 4  ASSESSMENT: 1. DM2, insulin-dependent,  uncontrolled, with complications - CAD - s/p 4 stents 2013 - seeing Dr Burt Knack - PN  Component     Latest Ref Rng 06/09/2016  C-Peptide     0.80-3.85 ng/mL 1.78  Glucose, Fasting     65 - 99 mg/dL 184 (H)  Glutamic Acid Decarb Ab     <5 IU/mL <5  Pancreatic Islet Cell Antibody     < 5 JDF Units <5  Slight insulin deficiency, but no clear type 1 diabetes.  2. HL  3. Overweight  PLAN:  1. Patient with longstanding, uncontrolled, type 2 diabetes, with increased variability of his blood sugars.  We checked him for type 1 diabetes but the investigation was negative.  Last year he had to come off Ozempic due to price.  He gained weight and sugars were worse, so we discussed about improving his diet and stopping alcohol and we also increased his insulin doses.  Since his sugars are higher, we had to switch from Lantus to NPH and sugars improved afterwards.  However, he came off Humalog for 2 weeks before last visit and sugars were higher.  We restarted the rapid acting insulin.  At last visit, we discussed about guiding the Humalog dose depending on the size of his meals, states he was only using a fixed dose of 16 units.  We also discussed about possibly starting Ozempic in the new year.  I advised him to check with his insurance whether this was covered.  He was able to start this back in 01/2020. -At this visit, sugars are better after he started Ozempic 2.5 weeks ago.  There is a clear trend towards improvement, although, sugars are still slightly above target.  For now, we discussed about continuing Ozempic at the same dose of 0.5 mg weekly. -His sugars before dinner are still above target, some in the 200s.  We discussed that he may need insulin with lunch, also.  He is having a small lunch so I advised him to take less Humalog, in case he eats more than 15 g with this meal. - I suggested to:  Patient Instructions  Please continue: - Metformin 1000 mg 2x a day, with meals - NPH 30 units in  am and 35-40 units at bedtime - Humalog 14-20 units 15 min before a meal - Ozempic 0.5 mg weekly  Please return in 3-4 months with your sugar log.   - we checked his HbA1c: 7.3% (much better) - advised to check sugars at different times of the day - 3x a day, rotating check times - advised for yearly eye exams >> he is UTD - return to clinic in 3-4 months     2. HL -Reviewed latest lipid panel from 11/2019: LDL excellent, at goal, as are the rest of his lipid fractions: Lab Results  Component Value Date   CHOL 112 12/11/2019   HDL 46.60 12/11/2019   LDLCALC 52 12/11/2019   LDLDIRECT 103.9 11/05/2013   TRIG 65.0 12/11/2019   CHOLHDL 2 12/11/2019  -Continues Crestor without side effects  3. Overweight -  Unfortunately, he gained weight after he came off Ozempic and I advised him to check with his insurance whether they would cover Ozempic again in the new year.  He was able to start in 01/2020. -At last visit, I also advised him to cut out sweets and alcohol. -He lost a net 10 lbs since last OV  Philemon Kingdom, MD PhD [facility] Endocrinology

## 2020-03-18 NOTE — Patient Instructions (Signed)
Please continue: - Metformin 1000 mg 2x a day, with meals - NPH 30 units in am and 35-40 units at bedtime - Humalog 14-20 units 15 min before a meal. You may need 6-8 units of Humalog with a lunch containing more than 15 g carbs. - Ozempic 0.5 mg weekly  Please return in 3-4 months with your sugar log.

## 2020-03-28 ENCOUNTER — Other Ambulatory Visit: Payer: Self-pay | Admitting: Internal Medicine

## 2020-04-02 ENCOUNTER — Ambulatory Visit (INDEPENDENT_AMBULATORY_CARE_PROVIDER_SITE_OTHER): Payer: Medicare Other | Admitting: Gastroenterology

## 2020-04-02 DIAGNOSIS — Z23 Encounter for immunization: Secondary | ICD-10-CM

## 2020-04-04 ENCOUNTER — Telehealth: Payer: Self-pay

## 2020-04-04 NOTE — Telephone Encounter (Signed)
Received a PA request for paroxetine 20mg  2 a day. It is a quantity issue. I spoke with Dr Silvio Pate, if he is taking 2 a day, I need to change it to 40mg  1 a day. Left message for pt to cal me back.

## 2020-04-04 NOTE — Telephone Encounter (Signed)
Patient states he is taking Paroxetine 20mg  - 2 a day. Patient states he is okay with taking 1 of the 40mg  tablets.

## 2020-04-07 ENCOUNTER — Telehealth: Payer: Self-pay

## 2020-04-07 NOTE — Telephone Encounter (Signed)
Started the PA to see what happens. Will change to 40mg  if they do not cover the 20mg  2 a day.

## 2020-04-07 NOTE — Telephone Encounter (Signed)
Pt is scheduled for an EGD tomorrow.  No record of COVID test or COVID vaccine found in pt's chart.  Called pt to confirm.  No answer.  LMTCB.

## 2020-04-07 NOTE — Telephone Encounter (Signed)
Received a phone call from [facility] stating that PA has been approved for 1 year.

## 2020-04-07 NOTE — Telephone Encounter (Signed)
Awesome! Thanks

## 2020-04-08 ENCOUNTER — Encounter: Payer: Self-pay | Admitting: Gastroenterology

## 2020-04-08 ENCOUNTER — Other Ambulatory Visit: Payer: Self-pay

## 2020-04-08 ENCOUNTER — Ambulatory Visit (AMBULATORY_SURGERY_CENTER): Payer: Medicare Other | Admitting: Gastroenterology

## 2020-04-08 VITALS — BP 113/65 | HR 67 | Temp 96.9°F | Resp 21 | Ht 69.0 in | Wt 186.0 lb

## 2020-04-08 DIAGNOSIS — I10 Essential (primary) hypertension: Secondary | ICD-10-CM | POA: Diagnosis not present

## 2020-04-08 DIAGNOSIS — I2510 Atherosclerotic heart disease of native coronary artery without angina pectoris: Secondary | ICD-10-CM | POA: Diagnosis not present

## 2020-04-08 DIAGNOSIS — E119 Type 2 diabetes mellitus without complications: Secondary | ICD-10-CM | POA: Diagnosis not present

## 2020-04-08 DIAGNOSIS — I8500 Esophageal varices without bleeding: Secondary | ICD-10-CM | POA: Diagnosis not present

## 2020-04-08 DIAGNOSIS — K3189 Other diseases of stomach and duodenum: Secondary | ICD-10-CM | POA: Diagnosis not present

## 2020-04-08 DIAGNOSIS — K259 Gastric ulcer, unspecified as acute or chronic, without hemorrhage or perforation: Secondary | ICD-10-CM | POA: Diagnosis not present

## 2020-04-08 DIAGNOSIS — K766 Portal hypertension: Secondary | ICD-10-CM | POA: Diagnosis not present

## 2020-04-08 MED ORDER — PROPRANOLOL HCL 40 MG PO TABS: 40.0000 mg | ORAL_TABLET | Freq: Two times a day (BID) | ORAL | 11 refills | Status: DC

## 2020-04-08 MED ORDER — SODIUM CHLORIDE 0.9 % IV SOLN: 500.0000 mL | Freq: Once | INTRAVENOUS | Status: DC

## 2020-04-08 NOTE — Op Note (Signed)
[facility] Patient Name: David [name] Procedure Date: 04/08/2020 10:17 AM MRN: [id number] Endoscopist: Milus Banister , MD Age: 69 Referring MD:  Date of Birth: 10/10/1951 Gender: Male Account #: [id number] Procedure:                Upper GI endoscopy Indications:              Cirrhosis, previous gastric ulcers: EGD                            08/2018:Grade II distal esophagus varices, several                            small distal gastric ulcers (path H. Pylori                            negative). Started on nadolol 20mg  daily, changed                            to propranolol 20 BID at some point. Medicines:                Monitored Anesthesia Care Procedure:                Pre-Anesthesia Assessment:                           - Prior to the procedure, a History and Physical                            was performed, and patient medications and                            allergies were reviewed. The patient's tolerance of                            previous anesthesia was also reviewed. The risks                            and benefits of the procedure and the sedation                            options and risks were discussed with the patient.                            All questions were answered, and informed consent                            was obtained. Prior Anticoagulants: The patient has                            taken no previous anticoagulant or antiplatelet                            agents. ASA Grade Assessment: III - A patient with  severe systemic disease. After reviewing the risks                            and benefits, the patient was deemed in                            satisfactory condition to undergo the procedure.                           After obtaining informed consent, the endoscope was                            passed under direct vision. Throughout the                            procedure, the patient's blood  pressure, pulse, and                            oxygen saturations were monitored continuously. The                            Endoscope was introduced through the mouth, and                            advanced to the second part of duodenum. The upper                            GI endoscopy was accomplished without difficulty.                            The patient tolerated the procedure well. Scope In: Scope Out: Findings:                 Several trunks of large (> 5 mm) varices were found                            in the lower third of the esophagus. None with                            signs of recent or impending bleeding.                           Moderate to severe changes of portal gastropathy.                           No gastric varices.                           The previously noted gastic ulcers have healed.                           The exam was otherwise without abnormality. Complications:            No immediate complications. Estimated blood loss:  None. Estimated Blood Loss:     Estimated blood loss: none. Impression:               Several trunks of large (> 5 mm) varices were found                            in the lower third of the esophagus. None with                            signs of recent or impending bleeding.                           Moderate to severe changes of portal gastropathy.                           No gastric varices.                           The previously noted gastic ulcers have healed. Recommendation:           - Patient has a contact number available for                            emergencies. The signs and symptoms of potential                            delayed complications were discussed with the                            patient. Return to normal activities tomorrow.                            Written discharge instructions were provided to the                            patient.                           -  Resume previous diet.                           - Continue present medications. New prescription                            written today to increase your propranolol (from                            20mg  BID). Propranolol 40mg , BID, disp one month                            with 11 refills.                           - CMA visit in Dr. Ardis Hughs' office in 10 days for HR  and BP check.                           - OV with Dr. Ardis Hughs in 2 months. Milus Banister, MD 04/08/2020 10:43:00 AM This report has been signed electronically.

## 2020-04-08 NOTE — Progress Notes (Signed)
Pt's states no medical or surgical changes since previsit or office visit. 

## 2020-04-08 NOTE — Progress Notes (Signed)
Report given to PACU, vss 

## 2020-04-08 NOTE — Patient Instructions (Signed)
You may resume your previous diet and medication schedule.  A prescription for Propranolol 40mg  twice a day has been sent to your pharmacy.  Thank you for allowing [place] to care for you today!!!   YOU HAD AN ENDOSCOPIC PROCEDURE TODAY AT [facility]:   Refer to the procedure report that was given to you for any specific questions about what was found during the examination.  If the procedure report does not answer your questions, please call your gastroenterologist to clarify.  If you requested that your care partner not be given the details of your procedure findings, then the procedure report has been included in a sealed envelope for you to review at your convenience later.  YOU SHOULD EXPECT: Some feelings of bloating in the abdomen. Passage of more gas than usual.  Walking can help get rid of the air that was put into your GI tract during the procedure and reduce the bloating.   Please Note:  You might notice some irritation and congestion in your nose or some drainage.  This is from the oxygen used during your procedure.  There is no need for concern and it should clear up in a day or so.  SYMPTOMS TO REPORT IMMEDIATELY:   Following upper endoscopy (EGD)  Vomiting of blood or coffee ground material  New chest pain or pain under the shoulder blades  Painful or persistently difficult swallowing  New shortness of breath  Fever of 100F or higher  Black, tarry-looking stools  For urgent or emergent issues, a gastroenterologist can be reached at any hour by calling [phone]. Do not use MyChart messaging for urgent concerns.    DIET:  We do recommend a small meal at first, but then you may proceed to your regular diet.  Drink plenty of fluids but you should avoid alcoholic beverages for 24 hours.  ACTIVITY:  You should plan to take it easy for the rest of today and you should NOT DRIVE or use heavy machinery until tomorrow (because of the sedation medicines used during  the test).    FOLLOW UP: Our staff will call the number listed on your records 48-72 hours following your procedure to check on you and address any questions or concerns that you may have regarding the information given to you following your procedure. If we do not reach you, we will leave a message.  We will attempt to reach you two times.  During this call, we will ask if you have developed any symptoms of COVID 19. If you develop any symptoms (ie: fever, flu-like symptoms, shortness of breath, cough etc.) before then, please call [phone].  If you test positive for Covid 19 in the 2 weeks post procedure, please call and report this information to [place].    If any biopsies were taken you will be contacted by phone or by letter within the next 1-3 weeks.  Please call us at [phone] if you have not heard about the biopsies in 3 weeks.    SIGNATURES/CONFIDENTIALITY: You and/or your care partner have signed paperwork which will be entered into your electronic medical record.  These signatures attest to the fact that that the information above on your After Visit Summary has been reviewed and is understood.  Full responsibility of the confidentiality of this discharge information lies with you and/or your care-partner.

## 2020-04-08 NOTE — Telephone Encounter (Signed)
Spoke with pt this morning.  Pt confirmed that he received the 2nd dose of his COVID vaccine on 03/04/20 at the [facility] site at [facility].

## 2020-04-09 ENCOUNTER — Ambulatory Visit (INDEPENDENT_AMBULATORY_CARE_PROVIDER_SITE_OTHER): Payer: Medicare Other | Admitting: Gastroenterology

## 2020-04-09 DIAGNOSIS — Z23 Encounter for immunization: Secondary | ICD-10-CM

## 2020-04-09 DIAGNOSIS — K7030 Alcoholic cirrhosis of liver without ascites: Secondary | ICD-10-CM

## 2020-04-10 ENCOUNTER — Telehealth: Payer: Self-pay | Admitting: *Deleted

## 2020-04-10 NOTE — Telephone Encounter (Signed)
  Follow up Call-  Call back number 04/08/2020 01/23/2018 01/09/2018  Post procedure Call Back phone  # [phone]  Permission to leave phone message Yes Yes Yes  Some recent data might be hidden     Patient questions:  Do you have a fever, pain , or abdominal swelling? No. Pain Score  0 *  Have you tolerated food without any problems? Yes.    Have you been able to return to your normal activities? Yes.    Do you have any questions about your discharge instructions: Diet   No. Medications  No. Follow up visit  No.  Do you have questions or concerns about your Care? No.  Actions: * If pain score is 4 or above: No action needed, pain <4.  1. Have you developed a fever since your procedure? no  2.   Have you had an respiratory symptoms (SOB or cough) since your procedure? no  3.   Have you tested positive for COVID 19 since your procedure no  4.   Have you had any family members/close contacts diagnosed with the COVID 19 since your procedure?  no   If yes to any of these questions please route to Joylene John, RN and Erenest Rasher, RN

## 2020-04-21 ENCOUNTER — Ambulatory Visit (INDEPENDENT_AMBULATORY_CARE_PROVIDER_SITE_OTHER): Payer: Self-pay | Admitting: Gastroenterology

## 2020-04-21 VITALS — BP 144/64 | HR 73

## 2020-04-21 DIAGNOSIS — Z4800 Encounter for change or removal of nonsurgical wound dressing: Secondary | ICD-10-CM

## 2020-04-21 DIAGNOSIS — Z0130 Encounter for examination of blood pressure without abnormal findings: Secondary | ICD-10-CM

## 2020-04-21 NOTE — Progress Notes (Signed)
Patient came into the office today for a blood pressure check.

## 2020-05-01 ENCOUNTER — Ambulatory Visit (INDEPENDENT_AMBULATORY_CARE_PROVIDER_SITE_OTHER): Payer: Medicare Other | Admitting: Gastroenterology

## 2020-05-01 DIAGNOSIS — Z23 Encounter for immunization: Secondary | ICD-10-CM

## 2020-05-04 ENCOUNTER — Other Ambulatory Visit: Payer: Self-pay | Admitting: Cardiology

## 2020-05-06 NOTE — Telephone Encounter (Signed)
propranolol (INDERAL) 40 MG tablet Medication Date: 04/08/2020 Department: [facility] Ordering/Authorizing: Milus Banister, MD  Order Providers  Prescribing Provider Encounter Provider  Milus Banister, MD Milus Banister, MD  Outpatient Medication Detail   Disp Refills Start End   propranolol (INDERAL) 40 MG tablet 30 tablet 11 04/08/2020    Sig - Route: Take 1 tablet (40 mg total) by mouth 2 (two) times daily. - Oral   Sent to pharmacy as: propranolol (INDERAL) 40 MG tablet   E-Prescribing Status: Receipt confirmed by pharmacy (04/08/2020 10:50 AM EDT)   Associated Diagnoses  Gastric ulcer, unspecified chronicity, unspecified whether gastric ulcer hemorrhage or perforation present - [place], [place]

## 2020-05-08 ENCOUNTER — Other Ambulatory Visit: Payer: Self-pay | Admitting: Cardiology

## 2020-05-13 DIAGNOSIS — H34812 Central retinal vein occlusion, left eye, with macular edema: Secondary | ICD-10-CM | POA: Diagnosis not present

## 2020-05-22 ENCOUNTER — Telehealth: Payer: Medicare Other

## 2020-05-22 ENCOUNTER — Telehealth: Payer: Self-pay

## 2020-05-22 NOTE — Telephone Encounter (Signed)
  Chronic Care Management   Outreach Note  05/22/2020 Name: David [name] MRN: [id number] DOB: [birth year]  PCP: [name], MD  CCM Unsuccessful Outreach  Attempted to reach patient by telephone for follow up CCM visit 05/22/20 at 11:00 AM. Left message with contact information for rescheduling.  Debbora Dus, PharmD Clinical Pharmacist [facility] at [facility] [phone]

## 2020-05-22 NOTE — Chronic Care Management (AMB) (Deleted)
Chronic Care Management Pharmacy  Name: David [name]  MRN: [id number] DOB: 1951/06/22   Chief Complaint/ HPI  David [name],  69 y.o., male presents for their Initial CCM visit with the clinical pharmacist via telephone.  PCP : [name], MD   Their chronic conditions include: hypertension, hyperlipidemia, depressive disorder, CAD, allergic rhinitis, sleep apnea, type 2 diabetes, cirrhosis, osteoarthritis, erectile dysfunction, anemia, esophageal varices   Patient concerns: denies medication concerns  Office Visits:  12/11/19: Silvio Pate - reduce added salt  Consult Visit:  04/08/20: GI/Endoscopy - increase propranolol to  mg BID   01/08/20: Gastroenterology/cirrhosis - start aldactone 50 mg daily, lasix 20 mg daily, start hepA/B series  12/18/19: Cruzita Lederer - continue current medications, resume Humalog  Allergies  Allergen Reactions  . Venlafaxine     Slowed his urine stream   Medications: Outpatient Encounter Medications as of 05/22/2020  Medication Sig  . ALPHA LIPOIC ACID PO Take 1 tablet by mouth daily.  . Ascorbic Acid (VITAMIN C) 1000 MG tablet Take 1,000 mg by mouth daily.  [name] b complex vitamins tablet Take 1 tablet by mouth daily.  . Cholecalciferol (D3 VITAMIN PO) Take 1 tablet by mouth daily.  . CHROMIUM PO Take 1 tablet by mouth daily.  . Coenzyme Q10 (CO Q 10) 100 MG CAPS Take 1 capsule by mouth daily.  . ferrous sulfate 325 (65 FE) MG tablet Take 325 mg by mouth daily with breakfast.  . furosemide (LASIX) 20 MG tablet Take 1 tablet (20 mg total) by mouth daily.  [name] glucose blood (ONE TOUCH ULTRA TEST) test strip Use to test blood sugar 2 times daily as instructed. Dx: E11.59, E11.65  . Insulin Lispro (HUMALOG KWIKPEN) 200 UNIT/ML SOPN Inject 14-20 Units into the skin 3 (three) times daily before meals.  . insulin NPH Human (NOVOLIN N RELION) 100 UNIT/ML injection Inject under skin 30 units in a.m. and 25 to 30 units at bedtime  . Insulin Pen Needle 32G X 4 MM  MISC Use 4x a day - with insulin  . Insulin Syringe-Needle U-100 (RELION INSULIN SYRINGE) 31G X 15/64" 0.5 ML MISC Inject twice a day  . losartan (COZAAR) 50 MG tablet TAKE 1 & 1/2 (ONE & ONE-HALF) TABLETS BY MOUTH ONCE DAILY  . metFORMIN (GLUCOPHAGE) 1000 MG tablet TAKE 1 TABLET BY MOUTH TWICE DAILY WITH MEALS  . Methylsulfonylmethane (MSM PO) Take 1 tablet by mouth daily.  . Multiple Vitamin (MULTIVITAMIN WITH MINERALS) TABS tablet Take 1 tablet by mouth at bedtime.  Glory Rosebush DELICA LANCETS FINE MISC Use to test blood sugar 2 times daily as instructed. Dx: E11.59, E11.65  . pantoprazole (PROTONIX) 40 MG tablet Take 1 tablet by mouth twice daily  . PARoxetine (PAXIL) 20 MG tablet TAKE 2 TABLETS BY MOUTH ONCE DAILY FOR DEPRESSION  . propranolol (INDERAL) 40 MG tablet Take 1 tablet (40 mg total) by mouth 2 (two) times daily.  . rosuvastatin (CRESTOR) 10 MG tablet Take 1 tablet by mouth once daily  . Semaglutide,0.25 or 0.5MG /DOS, (OZEMPIC, 0.25 OR 0.5 MG/DOSE,) 2 MG/1.5ML SOPN Inject 0.5 mg into the skin once a week.  . sildenafil (VIAGRA) 100 MG tablet Take 1 tablet (100 mg total) by mouth daily as needed for erectile dysfunction.  [name] spironolactone (ALDACTONE) 50 MG tablet Take 1 tablet (50 mg total) by mouth daily.  [name] triamcinolone cream (KENALOG) 0.1 % APPLY TOPICALLY TO SCALP TWO TIMES DAILY AS NEEDED   No facility-administered encounter medications on file as of 05/22/2020.  Current Diagnosis/Assessment: Goals    . Pharmacy Care Plan     CARE PLAN ENTRY  Current Barriers:  . Chronic Disease Management support, education, and care coordination needs related to hypertension, hyperlipidemia, depressive disorder, CAD, allergic rhinitis, sleep apnea, type 2 diabetes, cirrhosis, osteoarthritis, erectile dysfunction, anemia, esophageal varice  Pharmacist Clinical Goal(s):  [name] Improve diabetes control with goal of A1c less than 7%, fasting blood sugars 80-130 and 1-2 hours after meals less  than 180. Continue to make healthy dietary choices. Choose frozen meals with at least 15 grams of protein and avoid breaded meats, rice, and pasta based-options. Look for meals with the highest fiber and lowest sodium. Recommend setting phone alarm to help remember taking Humalog before meals.  . Reduce medication cost burden. Call [phone] or visit [email or web address]/ozempic/let-[email or web address]-help/pap.[email or web address] for information regarding patient assistance. Please call if you would like help with the application process.  . Remain up to date on vaccinations. Recommend shingles 2-dose series (Shingrix) from your pharmacy.  . Maintain blood pressure less than 140/90 mmHg. Recommend checking blood pressure once weekly. If elevated, please call.   Interventions: . Comprehensive medication review performed  Patient Self Care Activities:  . Self administers medications as prescribed . Self-monitors blood glucose  Initial goal documentation      Diabetes   Recent Relevant Labs: Lab Results  Component Value Date/Time   HGBA1C 7.3 (A) 03/18/2020 11:15 AM   HGBA1C 8.9 (H) 12/11/2019 11:05 AM   HGBA1C 9.3 (A) 09/17/2019 10:17 AM   HGBA1C 7.0 (H) 08/28/2018 06:45 PM   MICROALBUR 9.1 (H) 02/11/2010 10:11 AM   MICROALBUR 1.8 04/16/2009 09:52 AM    Checking BG: 2x per Day (before breakfast, before dinner)  Recent FBG Readings: low 200s throughout the day/very similar to last visit with endocrinologist per patient, did not have log with him  Patient has tried these meds in past: Ozempic (cost) 03/20-10/20 Patient is currently uncontrolled on the following medications:  Metformin 1000 mg - 1 tablet BID with meals  Insulin NPH - Inject 30 units in am and 35-40 at night (usually takes 35)   Humalog - Inject 14-20 units 15 min before a meal (usually take 14-16, if heavy meal will take 20 units)  Last diabetic eye exam:  Lab Results  Component Value Date/Time   HMDIABEYEEXA No Retinopathy 12/24/2019 12:00  AM    Last diabetic foot exam:  Lab Results  Component Value Date/Time   HMDIABFOOTEX done 11/27/2018 12:00 AM    We discussed: reports he plans to start back on Ozempic this Sunday, was able to get 30 day supply for $38 --> educated on patient assistance and pt plans to look into it; reports he occasionally forgets Humalog before meals, will take up to 30 minutes after meal if he remembers in time  Diet: reports not doing as well as he should, eating a lot of processed foods, canned foods, microwave frozen meals, as well as continuing 4-5 beers nightly   Plan: Continue current medications; Follow up with endocrinology. Plans to restart Ozempic this week. Continue to work on making healthy dietary choices. Recommend setting phone alarm to remind of Humalog before meals. Referred to patient assistance website.   Hypertension   CMP Latest Ref Rng & Units 02/25/2020 01/08/2020 12/11/2019  Glucose 70 - 99 mg/dL 230(H) 114(H) 241(H)  BUN 6 - 23 mg/dL 22 11 13   Creatinine 0.40 - 1.50 mg/dL 1.22 0.90 0.94  Sodium 135 - 145 mEq/L 134(L) 137 136  Potassium 3.5 -  5.1 mEq/L 5.0 4.7 5.3(H)  Chloride 96 - 112 mEq/L 102 105 104  CO2 19 - 32 mEq/L 25 27 27   Calcium 8.4 - 10.5 mg/dL 10.1 9.1 8.8  Total Protein 6.0 - 8.3 g/dL 8.1 7.9 7.7  Total Bilirubin 0.2 - 1.2 mg/dL 0.9 0.9 0.7  Alkaline Phos 39 - 117 U/L 102 124(H) 148(H)  AST 0 - 37 U/L 57(H) 57(H) 49(H)  ALT 0 - 53 U/L 44 34 28   Office blood pressures are: BP Readings from Last 3 Encounters:  04/21/20 (!) 144/64  04/08/20 113/65  03/18/20 140/78   Patient has failed these meds in the past: none Patient checks BP at home: infrequently, checks at grocery store Patient home BP readings are ranging: none reported, but states usually good   Patient is currently controlled on the following medications:   Losartan 50 mg - 1 and 1/2 tablet daily  Furosemide 20 mg - 1 tablet daily (per GI for swelling)  Spironolactone 50 mg - 1 tablet daily   (per GI for swelling)  Plan: Continue current medications; Recommend checking blood pressure once weekly.   Hyperlipidemia   Lipid Panel     Component Value Date/Time   CHOL 112 12/11/2019 1105   TRIG 65.0 12/11/2019 1105   HDL 46.60 12/11/2019 1105   CHOLHDL 2 12/11/2019 1105   VLDL 13.0 12/11/2019 1105   LDLCALC 52 12/11/2019 1105   LDLDIRECT 103.9 11/05/2013 1249     LDL goal < 70 (CAD) Patient has failed these meds in past: none Patient is currently controlled on the following medications:   Rosuvastatin 10 mg - 1 tablet daily  We discussed: confirms adherence, denies adverse effects  Plan: Continue current medications  Cirrhosis of liver   Followed by Owens Loffler Patient has tried these meds in past: Nadolol (unknown) Patient is currently controlled on the following medications:   Spironolactone 50 mg - 1 tablet daily  Propranolol 20 mg - 1 tablet BID  Furosemide 20 mg - 1 tablet daily  We discussed: kidney function has declined since starting diuretics, denies any swelling; reports follow up soon with GI  Plan: Continue current medications   Depression   Patient has failed these meds in past: none  Patient is currently controlled on the following medications:   Paroxetine 20 mg - 2 tablets daily (AM)  We discussed: working well  Plan: Continue current medications   Erectile Dysfunction   Patient has failed these meds in past: none reported Patient is currently controlled on the following medications:   Sildenafil 100 mg - 1 tablet PRN  We discussed: confirms still taking, sometimes doesn't work as well as others, takes on empty stomach, takes 1 tablet as needed  Plan: Continue current medications  Vaccines   Reviewed and discussed patient's vaccination history.    Immunization History  Administered Date(s) Administered  . Fluad Quad(high Dose 65+) 08/07/2019  . Hep A / Hep B 04/02/2020, 04/09/2020, 05/01/2020  . Influenza Split  08/30/2011, 09/18/2012  . Influenza Whole 08/21/2008, 10/02/2009, 08/18/2010  . Influenza, High Dose Seasonal PF 08/29/2018  . Influenza,inj,Quad PF,6+ Mos 11/05/2013, 09/12/2015, 08/27/2016, 09/02/2017  . Pneumococcal Conjugate-13 10/09/2014  . Pneumococcal Polysaccharide-23 02/11/2010, 01/18/2017  . Td 11/29/2002  . Tdap 11/05/2013   Plan: Recommend patient receive Shingrix 2-dose series.    Medication Management  Misc ( no diagnosis): pantoprazole 40 mg - BID  OTCs: OTC iron 324 mg daily - denies constipation, vitamin C 1000 mg, alpha lipoic acid -  antioxidant, b complex, vitamin D3, chromium - blood sugar, coq10 100 mg, MSM - joints, triamcinolone cream - PRN psoriasis, multivitamin daily  Pharmacy/Benefits: [employer]/[facility]   Adherence: some difficulty remembering Humalog dose with meals  Affordability: may be interested in PAP for Ozempic  CCM Follow Up: 05/22/20 at 11:00 AM (telephone)  Debbora Dus, PharmD Clinical Pharmacist [facility] at [facility] [phone]

## 2020-05-26 DIAGNOSIS — H40003 Preglaucoma, unspecified, bilateral: Secondary | ICD-10-CM | POA: Diagnosis not present

## 2020-05-26 DIAGNOSIS — H43811 Vitreous degeneration, right eye: Secondary | ICD-10-CM | POA: Diagnosis not present

## 2020-05-26 DIAGNOSIS — H35033 Hypertensive retinopathy, bilateral: Secondary | ICD-10-CM | POA: Diagnosis not present

## 2020-05-26 DIAGNOSIS — H34812 Central retinal vein occlusion, left eye, with macular edema: Secondary | ICD-10-CM | POA: Diagnosis not present

## 2020-06-10 ENCOUNTER — Other Ambulatory Visit: Payer: Self-pay

## 2020-06-10 ENCOUNTER — Encounter: Payer: Self-pay | Admitting: Internal Medicine

## 2020-06-10 ENCOUNTER — Ambulatory Visit (INDEPENDENT_AMBULATORY_CARE_PROVIDER_SITE_OTHER): Payer: Medicare Other | Admitting: Internal Medicine

## 2020-06-10 VITALS — BP 102/60 | HR 79 | Temp 97.3°F | Ht 69.0 in | Wt 187.0 lb

## 2020-06-10 DIAGNOSIS — K7031 Alcoholic cirrhosis of liver with ascites: Secondary | ICD-10-CM | POA: Diagnosis not present

## 2020-06-10 DIAGNOSIS — F1099 Alcohol use, unspecified with unspecified alcohol-induced disorder: Secondary | ICD-10-CM | POA: Diagnosis not present

## 2020-06-10 DIAGNOSIS — R351 Nocturia: Secondary | ICD-10-CM | POA: Insufficient documentation

## 2020-06-10 DIAGNOSIS — N401 Enlarged prostate with lower urinary tract symptoms: Secondary | ICD-10-CM | POA: Diagnosis not present

## 2020-06-10 DIAGNOSIS — I8510 Secondary esophageal varices without bleeding: Secondary | ICD-10-CM

## 2020-06-10 LAB — RENAL FUNCTION PANEL
Albumin: 4.1 g/dL (ref 3.5–5.2)
BUN: 34 mg/dL — ABNORMAL HIGH (ref 6–23)
CO2: 23 mEq/L (ref 19–32)
Calcium: 10 mg/dL (ref 8.4–10.5)
Chloride: 104 mEq/L (ref 96–112)
Creatinine, Ser: 1.19 mg/dL (ref 0.40–1.50)
GFR: 60.22 mL/min (ref >60.00)
Glucose, Bld: 219 mg/dL — ABNORMAL HIGH (ref 70–99)
Phosphorus: 3.4 mg/dL (ref 2.3–4.6)
Potassium: 5.2 mEq/L — ABNORMAL HIGH (ref 3.5–5.1)
Sodium: 134 mEq/L — ABNORMAL LOW (ref 135–145)

## 2020-06-10 LAB — CBC
HCT: 31.8 % — ABNORMAL LOW (ref 39.0–52.0)
Hemoglobin: 10.8 g/dL — ABNORMAL LOW (ref 13.0–17.0)
MCHC: 34 g/dL (ref 30.0–36.0)
MCV: 106.4 fl — ABNORMAL HIGH (ref 78.0–100.0)
Platelets: 99 10*3/uL — ABNORMAL LOW (ref 150.0–400.0)
RBC: 2.99 Mil/uL — ABNORMAL LOW (ref 4.22–5.81)
RDW: 15.1 % (ref 11.5–15.5)
WBC: 7.3 10*3/uL (ref 4.0–10.5)

## 2020-06-10 MED ORDER — ALFUZOSIN HCL ER 10 MG PO TB24: 10.0000 mg | ORAL_TABLET | Freq: Every day | ORAL | 11 refills | Status: DC

## 2020-06-10 NOTE — Assessment & Plan Note (Signed)
Trouble some now Will start medication

## 2020-06-10 NOTE — Assessment & Plan Note (Signed)
No apparent recent bleeding Will check CBC

## 2020-06-10 NOTE — Progress Notes (Signed)
Subjective:    Patient ID: David [name], male    DOB: [birth year], 69 y.o.   MRN: [id number]  HPI Here for follow up of multiple medical issues This visit occurred during the SARS-CoV-2 public health emergency.  Safety protocols were in place, including screening questions prior to the visit, additional usage of staff PPE, and extensive cleaning of exam room while observing appropriate contact time as indicated for disinfecting solutions.   He feels well Has lost abdominal girth and edema with diuretics No SOB No chest pain No dizziness or syncope  Sugars are better Last A1c 7.3% Checks daily No sig hypoglycemic reactions  Still drinking ---but not every day Has cut back some  Having more nocturia Slow stream--- trouble initiating and weak stream Up every 45 minutes at times Not much trouble at night  Also with places on back he wants checked ?warts?moles  Current Outpatient Medications on File Prior to Visit  Medication Sig Dispense Refill  . ALPHA LIPOIC ACID PO Take 1 tablet by mouth daily.    . Ascorbic Acid (VITAMIN C) 1000 MG tablet Take 1,000 mg by mouth daily.    [name] b complex vitamins tablet Take 1 tablet by mouth daily.    . Cholecalciferol (D3 VITAMIN PO) Take 1 tablet by mouth daily.    . CHROMIUM PO Take 1 tablet by mouth daily.    . Coenzyme Q10 (CO Q 10) 100 MG CAPS Take 1 capsule by mouth daily.    . ferrous sulfate 325 (65 FE) MG tablet Take 325 mg by mouth daily with breakfast.    . furosemide (LASIX) 20 MG tablet Take 1 tablet (20 mg total) by mouth daily. 30 tablet 6  . glucose blood (ONE TOUCH ULTRA TEST) test strip Use to test blood sugar 2 times daily as instructed. Dx: E11.59, E11.65 100 each 5  . Insulin Lispro (HUMALOG KWIKPEN) 200 UNIT/ML SOPN Inject 14-20 Units into the skin 3 (three) times daily before meals. 5 pen 5  . insulin NPH Human (NOVOLIN N RELION) 100 UNIT/ML injection Inject under skin 30 units in a.m. and 25 to 30 units at bedtime 20 mL  11  . Insulin Pen Needle 32G X 4 MM MISC Use 4x a day - with insulin 200 each 3  . Insulin Syringe-Needle U-100 (RELION INSULIN SYRINGE) 31G X 15/64" 0.5 ML MISC Inject twice a day 200 each 3  . losartan (COZAAR) 50 MG tablet TAKE 1 & 1/2 (ONE & ONE-HALF) TABLETS BY MOUTH ONCE DAILY 135 tablet 3  . metFORMIN (GLUCOPHAGE) 1000 MG tablet TAKE 1 TABLET BY MOUTH TWICE DAILY WITH MEALS 180 tablet 0  . Methylsulfonylmethane (MSM PO) Take 1 tablet by mouth daily.    . Multiple Vitamin (MULTIVITAMIN WITH MINERALS) TABS tablet Take 1 tablet by mouth at bedtime.    Glory Rosebush DELICA LANCETS FINE MISC Use to test blood sugar 2 times daily as instructed. Dx: E11.59, E11.65 100 each 5  . pantoprazole (PROTONIX) 40 MG tablet Take 1 tablet by mouth twice daily 180 tablet 1  . PARoxetine (PAXIL) 20 MG tablet TAKE 2 TABLETS BY MOUTH ONCE DAILY FOR DEPRESSION 180 tablet 1  . propranolol (INDERAL) 40 MG tablet Take 1 tablet (40 mg total) by mouth 2 (two) times daily. 30 tablet 11  . rosuvastatin (CRESTOR) 10 MG tablet Take 1 tablet by mouth once daily 90 tablet 3  . Semaglutide,0.25 or 0.5MG /DOS, (OZEMPIC, 0.25 OR 0.5 MG/DOSE,) 2 MG/1.5ML SOPN Inject 0.5 mg into  the skin once a week. 1 pen 3  . sildenafil (VIAGRA) 100 MG tablet Take 1 tablet (100 mg total) by mouth daily as needed for erectile dysfunction. 10 tablet 5  . spironolactone (ALDACTONE) 50 MG tablet Take 1 tablet (50 mg total) by mouth daily. 30 tablet 6  . triamcinolone cream (KENALOG) 0.1 % APPLY TOPICALLY TO SCALP TWO TIMES DAILY AS NEEDED 30 g 0   No current facility-administered medications on file prior to visit.    Allergies  Allergen Reactions  . Venlafaxine     Slowed his urine stream    Past Medical History:  Diagnosis Date  . Allergy   . Anemia   . Arthritis   . CAD (coronary artery disease)    s/p bifurcation stenting (DES x2) and stenting bifurcation lesion distal RCA (1 DES)  . Cataract   . Depression   . Diabetes mellitus     . GERD (gastroesophageal reflux disease)   . Hyperlipemia   . Hypertension   . Myocardial infarction ([place])   . Sleep apnea    CPAP    Past Surgical History:  Procedure Laterality Date  . BIOPSY  09/01/2018   Procedure: BIOPSY;  Surgeon: Mauri Pole, MD;  Location: [place];  Service: Endoscopy;;  . CATARACT EXTRACTION Bilateral   . CORONARY ANGIOPLASTY WITH STENT PLACEMENT   March 2010   Dr. Olevia Perches  . ESOPHAGOGASTRODUODENOSCOPY (EGD) WITH PROPOFOL N/A 09/01/2018   Procedure: ESOPHAGOGASTRODUODENOSCOPY (EGD) WITH PROPOFOL;  Surgeon: Mauri Pole, MD;  Location: [place] [facility];  Service: Endoscopy;  Laterality: N/A;  . LEFT HEART CATH AND CORONARY ANGIOGRAPHY N/A 08/31/2018   Procedure: LEFT HEART CATH AND CORONARY ANGIOGRAPHY;  Surgeon: Sherren Mocha, MD;  Location: [facility];  Service: Cardiovascular;  Laterality: N/A;  . TONSILLECTOMY  1959    Family History  Problem Relation Age of Onset  . Heart failure Father        Deceased 16 y/o  . Diabetes Father   . Cancer Father        Prostate  . Heart failure Mother        Deceased 107 y/o  . Alcohol abuse Mother   . Schizophrenia Son        Paranoid  . Uveitis Son   . Cancer Brother        prostate  . Colon cancer Neg Hx   . Esophageal cancer Neg Hx   . Stomach cancer Neg Hx   . Liver cancer Neg Hx   . Pancreatic cancer Neg Hx   . Rectal cancer Neg Hx     Social History   Socioeconomic History  . Marital status: Legally Separated    Spouse name: Not on file  . Number of children: 2  . Years of education: Not on file  . Highest education level: Not on file  Occupational History  . Occupation: Patent examiner buildings    Comment: site closed  . Occupation: Engineering geologist    Comment: part time  Tobacco Use  . Smoking status: Former Smoker    Types: Cigarettes    Quit date: 11/30/1983    Years since quitting: 36.5  . Smokeless tobacco: Never Used  Vaping Use  . Vaping Use:  Never used  Substance and Sexual Activity  . Alcohol use: Yes    Alcohol/week: 20.0 standard drinks    Types: 20 Standard drinks or equivalent per week    Comment: beer  . Drug use: No  .  Sexual activity: Not on file  Other Topics Concern  . Not on file  Social History Narrative   No living will   Would want want son [name], as health care POA   Would accept resuscitation   Not sure about tube feeds--but doesn't want if cognitively unaware   Social Determinants of Health   Financial Resource Strain:   . Difficulty of Paying Living Expenses:   Food Insecurity:   . Worried About Charity fundraiser in the Last Year:   . Arboriculturist in the Last Year:   Transportation Needs:   . Film/video editor (Medical):   [name] Lack of Transportation (Non-Medical):   Physical Activity:   . Days of Exercise per Week:   . Minutes of Exercise per Session:   Stress:   . Feeling of Stress :   Social Connections:   . Frequency of Communication with Friends and Family:   . Frequency of Social Gatherings with Friends and Family:   . Attends Religious Services:   . Active Member of Clubs or Organizations:   . Attends Archivist Meetings:   [name] Marital Status:   Intimate Partner Violence:   . Fear of Current or Ex-Partner:   . Emotionally Abused:   [name] Physically Abused:   . Sexually Abused:    Review of Systems Weight is still down Sleeps okay    Objective:   Physical Exam Constitutional:      Appearance: Normal appearance.  Cardiovascular:     Rate and Rhythm: Normal rate and regular rhythm.     Pulses: Normal pulses.     Heart sounds: No murmur heard.  No gallop.   Pulmonary:     Effort: Pulmonary effort is normal.     Breath sounds: Normal breath sounds. No wheezing or rales.  Abdominal:     General: There is no distension.     Palpations: Abdomen is soft.     Tenderness: There is no abdominal tenderness.  Musculoskeletal:     Right lower leg: No edema.     Left  lower leg: No edema.     Comments: Superficial varicosities  Skin:    Comments: 2 seb keratoses on back--reassured  Neurological:     Mental Status: He is alert.  Psychiatric:        Mood and Affect: Mood normal.        Behavior: Behavior normal.            Assessment & Plan:

## 2020-06-10 NOTE — Assessment & Plan Note (Signed)
Urged him to stop alcohol completely (but he did not indicate he was ready) At least as few as possible

## 2020-06-10 NOTE — Assessment & Plan Note (Signed)
Much better on the diuretics Will recheck labs

## 2020-06-18 ENCOUNTER — Ambulatory Visit: Payer: Medicare Other | Admitting: Gastroenterology

## 2020-06-21 ENCOUNTER — Other Ambulatory Visit: Payer: Self-pay | Admitting: Internal Medicine

## 2020-06-23 ENCOUNTER — Other Ambulatory Visit (INDEPENDENT_AMBULATORY_CARE_PROVIDER_SITE_OTHER): Payer: Medicare Other

## 2020-06-23 ENCOUNTER — Ambulatory Visit: Payer: Medicare Other | Admitting: Gastroenterology

## 2020-06-23 ENCOUNTER — Encounter: Payer: Self-pay | Admitting: Gastroenterology

## 2020-06-23 DIAGNOSIS — K259 Gastric ulcer, unspecified as acute or chronic, without hemorrhage or perforation: Secondary | ICD-10-CM

## 2020-06-23 DIAGNOSIS — K7030 Alcoholic cirrhosis of liver without ascites: Secondary | ICD-10-CM

## 2020-06-23 LAB — COMPREHENSIVE METABOLIC PANEL
ALT: 44 U/L (ref 0–53)
AST: 47 U/L — ABNORMAL HIGH (ref 0–37)
Albumin: 4.2 g/dL (ref 3.5–5.2)
Alkaline Phosphatase: 84 U/L (ref 39–117)
BUN: 25 mg/dL — ABNORMAL HIGH (ref 6–23)
CO2: 23 mEq/L (ref 19–32)
Calcium: 10.4 mg/dL (ref 8.4–10.5)
Chloride: 108 mEq/L (ref 96–112)
Creatinine, Ser: 1.2 mg/dL (ref 0.40–1.50)
GFR: 59.64 mL/min — ABNORMAL LOW (ref >60.00)
Glucose, Bld: 162 mg/dL — ABNORMAL HIGH (ref 70–99)
Potassium: 4.8 mEq/L (ref 3.5–5.1)
Sodium: 137 mEq/L (ref 135–145)
Total Bilirubin: 0.6 mg/dL (ref 0.2–1.2)
Total Protein: 7.9 g/dL (ref 6.0–8.3)

## 2020-06-23 LAB — CBC
HCT: 29.8 % — ABNORMAL LOW (ref 39.0–52.0)
Hemoglobin: 10.2 g/dL — ABNORMAL LOW (ref 13.0–17.0)
MCHC: 34.3 g/dL (ref 30.0–36.0)
MCV: 105.2 fl — ABNORMAL HIGH (ref 78.0–100.0)
Platelets: 95 10*3/uL — ABNORMAL LOW (ref 150.0–400.0)
RBC: 2.84 Mil/uL — ABNORMAL LOW (ref 4.22–5.81)
RDW: 15.4 % (ref 11.5–15.5)
WBC: 7 10*3/uL (ref 4.0–10.5)

## 2020-06-23 LAB — PROTIME-INR
INR: 1.1 ratio — ABNORMAL HIGH (ref 0.8–1.0)
Prothrombin Time: 12.7 s (ref 9.6–13.1)

## 2020-06-23 MED ORDER — PROPRANOLOL HCL 60 MG PO TABS: 60.0000 mg | ORAL_TABLET | Freq: Two times a day (BID) | ORAL | 6 refills | Status: DC

## 2020-06-23 NOTE — Patient Instructions (Addendum)
If you are age 71 or older, your body mass index should be between 23-30. Your Body mass index is 27.93 kg/m. If this is out of the aforementioned range listed, please consider follow up with your Primary Care Provider.  If you are age 24 or younger, your body mass index should be between 19-25. Your Body mass index is 27.93 kg/m. If this is out of the aformentioned range listed, please consider follow up with your Primary Care Provider.   Your provider has requested that you go to the basement level for lab work before leaving today. Press "B" on the elevator. The lab is located at the first door on the left as you exit the elevator.  Due to recent changes in healthcare laws, you may see the results of your imaging and laboratory studies on MyChart before your provider has had a chance to review them.  We understand that in some cases there may be results that are confusing or concerning to you. Not all laboratory results come back in the same time frame and the provider may be waiting for multiple results in order to interpret others.  Please give [place] 48 hours in order for your provider to thoroughly review all the results before contacting the office for clarification of your results.    We have sent the following medications to your pharmacy for you to pick up at your convenience:  INCREASE: propranolol to 60 mg one tablet twice daily.  You have been scheduled for an abdominal ultrasound at [facility] (1st floor of hospital) on 08-28-20 at 9:30am. Please arrive 15 minutes prior to your appointment for registration. Make certain not to have anything to eat or drink after midnight the night prior to your appointment. Should you need to reschedule your appointment, please contact radiology at [phone]. This test typically takes about 30 minutes to perform.  You will return to our office on 07-07-20 at 8:40am for blood pressure and heart rate check.  You will be seen in follow up in 6  months (Jan 2022).  Thank you for entrusting me with your care and choosing [facility].  Dr Ardis Hughs

## 2020-06-23 NOTE — Progress Notes (Signed)
Review of pertinent gastrointestinal problems: 1.Cirrhosis, etoh related, diagnosed 2019.Long history of alcohol abuse:beer, bourbon. Workup 2019 labs ANA negative, normal iron studies, Hep C Ab negative, Hep B Surface Ab negative, Hep B Surface Ag negative, Hep A total Ab negative.   Imaging:US October 2019 showed nodule lower liver. No focal lesions. Ultrasound January 2021 no focal liverlesion. +Mild to moderate ascites.  EGD 08/2018:Grade II distal esophagus varices, several small distal gastric ulcers (path H. Pylori negative). Started on nadolol 20mg  daily, changed to propranolol 20 BID at some point.  EGD May 2021 showed several trunks of large lower esophageal varices.  Also moderate severe changes of portal gastropathy, no gastric varices.  His propranolol was increased to 40 twice daily.  Still drinking July 2021  Meld score 8 based on February 2021 labs 2.History of adenomatous colon polyps.Colonoscopy 2012 2 subcentimeter adenomas removed. Colonoscopy 2/2019normal examination. Recall recommended 10 years   HPI: This is a very pleasant 69 year old man whom I last saw the time of an upper endoscopy.  See those results summarized above  He struggles with his alcoholism, he is still drinking 4-6 beers every other day and sometimes 1 or 2 in between those days.  He is otherwise feeling quite well.  He has no overt GI bleeding.  He confirmed that he is taking his propranolol correctly, 40 mg twice daily.  He is also on very low-dose diuretics he confirmed those doses as well.  Spironolactone 50 mg once daily, Lasix 20 mg once daily.   ROS: complete GI ROS as described in HPI, all other review negative.  Constitutional:  No unintentional weight loss   Past Medical History:  Diagnosis Date  . Allergy   . Anemia   . Arthritis   . CAD (coronary artery disease)    s/p bifurcation stenting (DES x2) and stenting bifurcation lesion distal RCA (1 DES)  . Cataract    . Cirrhosis ([place])   . Depression   . Diabetes mellitus   . Esophageal varices ([place])   . GERD (gastroesophageal reflux disease)   . Hyperlipemia   . Hypertension   . Myocardial infarction ([place])   . Sleep apnea    CPAP    Past Surgical History:  Procedure Laterality Date  . BIOPSY  09/01/2018   Procedure: BIOPSY;  Surgeon: Mauri Pole, MD;  Location: [facility];  Service: Endoscopy;;  . CATARACT EXTRACTION Bilateral   . CORONARY ANGIOPLASTY WITH STENT PLACEMENT   March 2010   Dr. Olevia Perches  . ESOPHAGOGASTRODUODENOSCOPY (EGD) WITH PROPOFOL N/A 09/01/2018   Procedure: ESOPHAGOGASTRODUODENOSCOPY (EGD) WITH PROPOFOL;  Surgeon: Mauri Pole, MD;  Location: [facility];  Service: Endoscopy;  Laterality: N/A;  . LEFT HEART CATH AND CORONARY ANGIOGRAPHY N/A 08/31/2018   Procedure: LEFT HEART CATH AND CORONARY ANGIOGRAPHY;  Surgeon: Sherren Mocha, MD;  Location: [facility];  Service: Cardiovascular;  Laterality: N/A;  . TONSILLECTOMY  1959    Current Outpatient Medications  Medication Sig Dispense Refill  . alfuzosin (UROXATRAL) 10 MG 24 hr tablet Take 1 tablet (10 mg total) by mouth daily with breakfast. 30 tablet 11  . ALPHA LIPOIC ACID PO Take 1 tablet by mouth daily.    . Ascorbic Acid (VITAMIN C) 1000 MG tablet Take 1,000 mg by mouth daily.    [name] b complex vitamins tablet Take 1 tablet by mouth daily.    . Cholecalciferol (D3 VITAMIN PO) Take 1 tablet by mouth daily.    . CHROMIUM PO Take 1  tablet by mouth daily.    . Coenzyme Q10 (CO Q 10) 100 MG CAPS Take 1 capsule by mouth daily.    . ferrous sulfate 325 (65 FE) MG tablet Take 325 mg by mouth daily with breakfast.    . furosemide (LASIX) 20 MG tablet Take 1 tablet (20 mg total) by mouth daily. 30 tablet 6  . glucose blood (ONE TOUCH ULTRA TEST) test strip Use to test blood sugar 2 times daily as instructed. Dx: E11.59, E11.65 100 each 5  . Insulin Lispro (HUMALOG KWIKPEN) 200 UNIT/ML SOPN Inject 14-20 Units  into the skin 3 (three) times daily before meals. 5 pen 5  . insulin NPH Human (NOVOLIN N RELION) 100 UNIT/ML injection Inject under skin 30 units in a.m. and 25 to 30 units at bedtime 20 mL 11  . Insulin Pen Needle 32G X 4 MM MISC Use 4x a day - with insulin 200 each 3  . Insulin Syringe-Needle U-100 (RELION INSULIN SYRINGE) 31G X 15/64" 0.5 ML MISC Inject twice a day 200 each 3  . losartan (COZAAR) 50 MG tablet TAKE 1 & 1/2 (ONE & ONE-HALF) TABLETS BY MOUTH ONCE DAILY 135 tablet 3  . metFORMIN (GLUCOPHAGE) 1000 MG tablet TAKE 1 TABLET BY MOUTH TWICE DAILY WITH MEALS 180 tablet 0  . Methylsulfonylmethane (MSM PO) Take 1 tablet by mouth daily.    . Multiple Vitamin (MULTIVITAMIN WITH MINERALS) TABS tablet Take 1 tablet by mouth at bedtime.    Glory Rosebush DELICA LANCETS FINE MISC Use to test blood sugar 2 times daily as instructed. Dx: E11.59, E11.65 100 each 5  . pantoprazole (PROTONIX) 40 MG tablet Take 1 tablet by mouth twice daily 180 tablet 3  . PARoxetine (PAXIL) 20 MG tablet TAKE 2 TABLETS BY MOUTH ONCE DAILY FOR DEPRESSION 180 tablet 1  . propranolol (INDERAL) 40 MG tablet Take 1 tablet (40 mg total) by mouth 2 (two) times daily. 30 tablet 11  . rosuvastatin (CRESTOR) 10 MG tablet Take 1 tablet by mouth once daily 90 tablet 3  . Semaglutide,0.25 or 0.5MG /DOS, (OZEMPIC, 0.25 OR 0.5 MG/DOSE,) 2 MG/1.5ML SOPN Inject 0.5 mg into the skin once a week. 1 pen 3  . sildenafil (VIAGRA) 100 MG tablet Take 1 tablet (100 mg total) by mouth daily as needed for erectile dysfunction. 10 tablet 5  . spironolactone (ALDACTONE) 50 MG tablet Take 1 tablet (50 mg total) by mouth daily. 30 tablet 6  . triamcinolone cream (KENALOG) 0.1 % APPLY TOPICALLY TO SCALP TWO TIMES DAILY AS NEEDED 30 g 0   No current facility-administered medications for this visit.    Allergies as of 06/23/2020 - Review Complete 06/23/2020  Allergen Reaction Noted  . Venlafaxine  07/09/2014    Family History  Problem Relation Age  of Onset  . Heart failure Father        Deceased 5 y/o  . Diabetes Father   . Cancer Father        Prostate  . Heart failure Mother        Deceased 33 y/o  . Alcohol abuse Mother   . Schizophrenia Son        Paranoid  . Uveitis Son   . Cancer Brother        prostate  . Colon cancer Neg Hx   . Esophageal cancer Neg Hx   . Stomach cancer Neg Hx   . Liver cancer Neg Hx   . Pancreatic cancer Neg Hx   . Rectal  cancer Neg Hx     Social History   Socioeconomic History  . Marital status: Legally Separated    Spouse name: Not on file  . Number of children: 2  . Years of education: Not on file  . Highest education level: Not on file  Occupational History  . Occupation: Patent examiner buildings    Comment: site closed  . Occupation: Engineering geologist    Comment: part time  Tobacco Use  . Smoking status: Former Smoker    Types: Cigarettes    Quit date: 11/30/1983    Years since quitting: 36.5  . Smokeless tobacco: Never Used  Vaping Use  . Vaping Use: Never used  Substance and Sexual Activity  . Alcohol use: Yes    Alcohol/week: 20.0 standard drinks    Types: 20 Standard drinks or equivalent per week    Comment: beer  . Drug use: No  . Sexual activity: Not on file  Other Topics Concern  . Not on file  Social History Narrative   No living will   Would want want son [name], as health care POA   Would accept resuscitation   Not sure about tube feeds--but doesn't want if cognitively unaware   Social Determinants of Health   Financial Resource Strain:   . Difficulty of Paying Living Expenses:   Food Insecurity:   . Worried About Charity fundraiser in the Last Year:   . Arboriculturist in the Last Year:   Transportation Needs:   . Film/video editor (Medical):   [name] Lack of Transportation (Non-Medical):   Physical Activity:   . Days of Exercise per Week:   . Minutes of Exercise per Session:   Stress:   . Feeling of Stress :   Social Connections:   .  Frequency of Communication with Friends and Family:   . Frequency of Social Gatherings with Friends and Family:   . Attends Religious Services:   . Active Member of Clubs or Organizations:   . Attends Archivist Meetings:   [name] Marital Status:   Intimate Partner Violence:   . Fear of Current or Ex-Partner:   . Emotionally Abused:   [name] Physically Abused:   . Sexually Abused:      Physical Exam: BP (!) 110/62   Pulse 76   Ht 5' 8.5" (1.74 m)   Wt 186 lb 6.4 oz (84.6 kg)   BMI 27.93 kg/m  Constitutional: generally well-appearing Psychiatric: alert and oriented x3 Abdomen: soft, nontender, nondistended, no obvious ascites, no peritoneal signs, normal bowel sounds No peripheral edema noted in lower extremities  Assessment and plan: 69 y.o. male with alcoholic cirrhosis  First he understands that most important is that he continue to try to stop drinking.  He had a large distal esophageal varices at his EGD 2 months ago.  I had increased his propranolol up to 40 mg twice daily and based on his blood pressure and heart rate today as well as during a nurse visit several weeks ago it looks like it is safe to increase him further.  He is going to start taking 60 mg twice daily, we are giving him a new prescription for that as well.  He will return for a nurse visit in 10 days to 2 weeks for heart rate and blood pressure check again.  He needs basic labs to check his overall synthetic function, calculated meld score with CBC, complete metabolic profile and coags.  He needs  hepatoma screening with ultrasound, his last imaging was by ultrasound 6 months ago.  He will return to see me again in 6 months and sooner if any problems.  Please see the "Patient Instructions" section for addition details about the plan.  Owens Loffler, MD [facility] 06/23/2020, 2:13 PM   Total time on date of encounter was 32 minutes (this included time spent preparing to see the patient  reviewing records; obtaining and/or reviewing separately obtained history; performing a medically appropriate exam and/or evaluation; counseling and educating the patient and family if present; ordering medications, tests or procedures if applicable; and documenting clinical information in the health record).

## 2020-06-24 ENCOUNTER — Other Ambulatory Visit: Payer: Self-pay | Admitting: Internal Medicine

## 2020-06-27 ENCOUNTER — Ambulatory Visit (HOSPITAL_COMMUNITY): Admission: RE | Admit: 2020-06-27 | Payer: Medicare Other | Source: Ambulatory Visit

## 2020-07-21 ENCOUNTER — Other Ambulatory Visit: Payer: Self-pay | Admitting: Internal Medicine

## 2020-07-21 DIAGNOSIS — H348122 Central retinal vein occlusion, left eye, stable: Secondary | ICD-10-CM | POA: Diagnosis not present

## 2020-07-21 DIAGNOSIS — H3562 Retinal hemorrhage, left eye: Secondary | ICD-10-CM | POA: Diagnosis not present

## 2020-07-21 DIAGNOSIS — H35033 Hypertensive retinopathy, bilateral: Secondary | ICD-10-CM | POA: Diagnosis not present

## 2020-07-21 DIAGNOSIS — H43811 Vitreous degeneration, right eye: Secondary | ICD-10-CM | POA: Diagnosis not present

## 2020-07-28 ENCOUNTER — Encounter: Payer: Self-pay | Admitting: Internal Medicine

## 2020-07-28 ENCOUNTER — Ambulatory Visit: Payer: Medicare Other | Admitting: Internal Medicine

## 2020-07-28 ENCOUNTER — Ambulatory Visit: Payer: Medicare Other | Admitting: Cardiovascular Disease

## 2020-07-28 ENCOUNTER — Other Ambulatory Visit: Payer: Self-pay

## 2020-07-28 VITALS — BP 140/70 | HR 74 | Ht 68.5 in | Wt 190.0 lb

## 2020-07-28 DIAGNOSIS — E1159 Type 2 diabetes mellitus with other circulatory complications: Secondary | ICD-10-CM

## 2020-07-28 DIAGNOSIS — E785 Hyperlipidemia, unspecified: Secondary | ICD-10-CM

## 2020-07-28 DIAGNOSIS — E1165 Type 2 diabetes mellitus with hyperglycemia: Secondary | ICD-10-CM

## 2020-07-28 DIAGNOSIS — E663 Overweight: Secondary | ICD-10-CM

## 2020-07-28 LAB — POCT GLYCOSYLATED HEMOGLOBIN (HGB A1C): Hemoglobin A1C: 7.5 % — AB (ref 4.0–5.6)

## 2020-07-28 MED ORDER — OZEMPIC (1 MG/DOSE) 4 MG/3ML ~~LOC~~ SOPN
1.0000 mg | PEN_INJECTOR | SUBCUTANEOUS | 3 refills | Status: DC
Start: 2020-07-28 — End: 2020-08-26

## 2020-07-28 MED ORDER — INSULIN NPH (HUMAN) (ISOPHANE) 100 UNIT/ML ~~LOC~~ SUSP: SUBCUTANEOUS | 11 refills | Status: DC

## 2020-07-28 MED ORDER — METFORMIN HCL 1000 MG PO TABS: 1000.0000 mg | ORAL_TABLET | Freq: Two times a day (BID) | ORAL | 3 refills | Status: DC

## 2020-07-28 NOTE — Progress Notes (Signed)
Patient ID: David [name], male   DOB: 25-Jul-1951, 69 y.o.   MRN: [id number]   This visit occurred during the SARS-CoV-2 public health emergency.  Safety protocols were in place, including screening questions prior to the visit, additional usage of staff PPE, and extensive cleaning of exam room while observing appropriate contact time as indicated for disinfecting solutions.   HPI: David [name] is a 69 y.o.-year-old male, returning for f/u for DM2, dx in ~2008, insulin-dependent since 09/2015, uncontrolled, with complications (CAD - s/p stents in 2013, PN). Last visit 4 months ago.  He had a difficult year.  He and his wife of 40 years separated and at last visit he was living outside of his home.  His eating habits were poor and he continues to drink alcohol.  Sugars were still high but they started to improve before last visit.    This year he was also diagnosed with cirrhosis.  He has anemia and decreased platelets. He is on iron.  Reviewed HbA1c levels: Lab Results  Component Value Date   HGBA1C 7.3 (A) 03/18/2020   HGBA1C 8.9 (H) 12/11/2019   HGBA1C 9.3 (A) 09/17/2019  03/05/2016: HbA1c 9.1%  He is on: - Metformin 1000 mg 2x a day, with meals - Lantus 40 >> 48 units at bedtime >> NPH 30 units in a.m. and  (25-30 units) units at night - Humalog 8-12 >> 16 >> 14-20 15 min before a meal - 14 with B + 0 units with L + 16 units with B >> 16-20 units before a meal - Ozempic 0.5 mg weekly He was on Ozempic from 01/2019 to 08/2019 but had to come off due to price.  We restarted this in 01/2020. He was on Bydureon 2 mg weekly - started 01/30/2017 >> stopped 04/2017 b/c price - could not restart  Pt checks his sugars twice a day per review of his log: - am: 208-287, 300, 329 >> 95-240, 261, 300 >> 138-253 >> 115, 119, 177-255, 285 - 2h after b'fast: n/c  - before lunch: 118-210 >> 85-187, 201 >> 158, 327 >> n/c >> n/c - 2h after lunch: n/c - before dinner: 164-280, 300 >> 79-206, 235 >>  121-185, 229 >> 125-253 - 2h after dinner: n/c - bedtime:  126-209, 225-240 (candy), 261 >> 160-215 >> n/c - nighttime: n/c Lowest sugar was 164 >> 79 >> 90 >> 115; it is unclear at which level he has hypoglycemia awareness. Highest sugar was 329 >> 261 >> 253 >> 285.  Glucometer: One Touch  Pt's meals are: - Breakfast: protein shake - Lunch: n/a - Dinner: meat + veggies /potatoes or salad - Snacks: no Diet sodas, ice tea with splenda.  No regular sodas.  -No history of CKD, last BUN/creatinine:  Lab Results  Component Value Date   BUN 25 (H) 06/23/2020   CREATININE 1.20 06/23/2020  On losartan. -+ HL; last set of lipids: Lab Results  Component Value Date   CHOL 112 12/11/2019   HDL 46.60 12/11/2019   LDLCALC 52 12/11/2019   LDLDIRECT 103.9 11/05/2013   TRIG 65.0 12/11/2019   CHOLHDL 2 12/11/2019  On Crestor. - last eye exam was in 11/2019: No DR.  Has previous history of cataract surgery. -+ numbness and tingling in his feet  He also has HTN, depression.  He has a history of anabolic steroid use for muscle building.  He had a cardiac cath 08/2018 for presyncope: Stable coronary anatomy without high-grade coronary stenosis.  He is on  medical therapy for CAD.  He was found to be anemic (Hb 8.1) >> EGD report reviewed:  - Non-bleeding grade II esophageal varices. - Non-bleeding gastric ulcers with a clean ulcer base (Forrest Class III). Biopsied. - Erythematous duodenopathy. - Normal second portion of the duodenum.  ROS: Constitutional: no weight gain/no weight loss, no fatigue, no subjective hyperthermia, no subjective hypothermia Eyes: no blurry vision, no xerophthalmia ENT: no sore throat, no nodules palpated in neck, no dysphagia, no odynophagia, no hoarseness Cardiovascular: no CP/no SOB/no palpitations/no leg swelling Respiratory: no cough/no SOB/no wheezing Gastrointestinal: no N/no V/no D/no C/no acid reflux Musculoskeletal: no muscle aches/no joint  aches Skin: no rashes, no hair loss Neurological: + tremors/+ numbness/+ tingling/no dizziness  I reviewed pt's medications, allergies, PMH, social hx, family hx, and changes were documented in the history of present illness. Otherwise, unchanged from my initial visit note.    Past Medical History:  Diagnosis Date  . Allergy   . Anemia   . Arthritis   . CAD (coronary artery disease)    s/p bifurcation stenting (DES x2) and stenting bifurcation lesion distal RCA (1 DES)  . Cataract   . Cirrhosis ([place])   . Depression   . Diabetes mellitus   . Esophageal varices ([place])   . GERD (gastroesophageal reflux disease)   . Hyperlipemia   . Hypertension   . Myocardial infarction ([place])   . Sleep apnea    CPAP   Past Surgical History:  Procedure Laterality Date  . BIOPSY  09/01/2018   Procedure: BIOPSY;  Surgeon: Mauri Pole, MD;  Location: [facility];  Service: Endoscopy;;  . CATARACT EXTRACTION Bilateral   . CORONARY ANGIOPLASTY WITH STENT PLACEMENT   March 2010   Dr. Olevia Perches  . ESOPHAGOGASTRODUODENOSCOPY (EGD) WITH PROPOFOL N/A 09/01/2018   Procedure: ESOPHAGOGASTRODUODENOSCOPY (EGD) WITH PROPOFOL;  Surgeon: Mauri Pole, MD;  Location: [facility];  Service: Endoscopy;  Laterality: N/A;  . LEFT HEART CATH AND CORONARY ANGIOGRAPHY N/A 08/31/2018   Procedure: LEFT HEART CATH AND CORONARY ANGIOGRAPHY;  Surgeon: Sherren Mocha, MD;  Location: [facility];  Service: Cardiovascular;  Laterality: N/A;  . TONSILLECTOMY  1959   Social History   Social History  . Marital Status: Married    Spouse Name: N/A  . Number of Children: 2   Occupational History  . 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 Smoking status: Former Smoker    Types: Cigarettes    Quit date: 11/30/1983  . Smokeless tobacco: Never Used  . Alcohol Use:  beer, 3-4 times a week, 6-8 drinks at the time       . Drug Use: No   Social  History Narrative   No living will   Would want wife, then son David [name], as health care POA   Would accept resuscitation   Not sure about tube feeds   Current Outpatient Medications on File Prior to Visit  Medication Sig Dispense Refill  . alfuzosin (UROXATRAL) 10 MG 24 hr tablet Take 1 tablet (10 mg total) by mouth daily with breakfast. 30 tablet 11  . ALPHA LIPOIC ACID PO Take 1 tablet by mouth daily.    . Ascorbic Acid (VITAMIN C) 1000 MG tablet Take 1,000 mg by mouth daily.    [name] b complex vitamins tablet Take 1 tablet by mouth daily.    . Cholecalciferol (D3 VITAMIN PO) Take 1 tablet by mouth  daily.    . CHROMIUM PO Take 1 tablet by mouth daily.    . Coenzyme Q10 (CO Q 10) 100 MG CAPS Take 1 capsule by mouth daily.    . ferrous sulfate 325 (65 FE) MG tablet Take 325 mg by mouth daily with breakfast.    . furosemide (LASIX) 20 MG tablet Take 1 tablet (20 mg total) by mouth daily. 30 tablet 6  . glucose blood (ONE TOUCH ULTRA TEST) test strip Use to test blood sugar 2 times daily as instructed. Dx: E11.59, E11.65 100 each 5  . Insulin Lispro (HUMALOG KWIKPEN) 200 UNIT/ML SOPN Inject 14-20 Units into the skin 3 (three) times daily before meals. 5 pen 5  . insulin NPH Human (NOVOLIN N RELION) 100 UNIT/ML injection Inject under skin 30 units in a.m. and 25 to 30 units at bedtime 20 mL 11  . Insulin Pen Needle 32G X 4 MM MISC Use 4x a day - with insulin 200 each 3  . Insulin Syringe-Needle U-100 (RELION INSULIN SYRINGE) 31G X 15/64" 0.5 ML MISC Inject twice a day 200 each 3  . losartan (COZAAR) 50 MG tablet TAKE 1 & 1/2 (ONE & ONE-HALF) TABLETS BY MOUTH ONCE DAILY 135 tablet 3  . metFORMIN (GLUCOPHAGE) 1000 MG tablet TAKE 1 TABLET BY MOUTH TWICE DAILY WITH MEALS 180 tablet 0  . Methylsulfonylmethane (MSM PO) Take 1 tablet by mouth daily.    . Multiple Vitamin (MULTIVITAMIN WITH MINERALS) TABS tablet Take 1 tablet by mouth at bedtime.    Glory Rosebush DELICA LANCETS FINE MISC Use to test blood sugar 2  times daily as instructed. Dx: E11.59, E11.65 100 each 5  . OZEMPIC, 0.25 OR 0.5 MG/DOSE, 2 MG/1.5ML SOPN INJECT 0.5MG  INTO THE SKIN ONCE WEEKLY 2 mL 0  . pantoprazole (PROTONIX) 40 MG tablet Take 1 tablet by mouth twice daily 180 tablet 3  . PARoxetine (PAXIL) 20 MG tablet TAKE 2 TABLETS BY MOUTH ONCE DAILY FOR DEPRESSION 180 tablet 1  . propranolol (INDERAL) 60 MG tablet Take 1 tablet (60 mg total) by mouth 2 (two) times daily. 60 tablet 6  . rosuvastatin (CRESTOR) 10 MG tablet Take 1 tablet by mouth once daily 90 tablet 3  . sildenafil (VIAGRA) 100 MG tablet Take 1 tablet (100 mg total) by mouth daily as needed for erectile dysfunction. 10 tablet 5  . spironolactone (ALDACTONE) 50 MG tablet Take 1 tablet (50 mg total) by mouth daily. 30 tablet 6  . triamcinolone cream (KENALOG) 0.1 % APPLY TOPICALLY TO SCALP TWO TIMES DAILY AS NEEDED 30 g 0   No current facility-administered medications on file prior to visit.   Allergies  Allergen Reactions  . Venlafaxine     Slowed his urine stream   Family History  Problem Relation Age of Onset  . Heart failure Father        Deceased 88 y/o  . Diabetes Father   . Cancer Father        Prostate  . Heart failure Mother        Deceased 78 y/o  . Alcohol abuse Mother   . Schizophrenia Son        Paranoid  . Uveitis Son   . Cancer Brother        prostate  . Colon cancer Neg Hx   . Esophageal cancer Neg Hx   . Stomach cancer Neg Hx   . Liver cancer Neg Hx   . Pancreatic cancer Neg Hx   .  Rectal cancer Neg Hx    PE: BP 140/70   Pulse 74   Ht 5' 8.5" (1.74 m)   Wt 190 lb (86.2 kg)   SpO2 97%   BMI 28.47 kg/m  Body mass index is 28.47 kg/m. Wt Readings from Last 3 Encounters:  07/28/20 190 lb (86.2 kg)  06/23/20 186 lb 6.4 oz (84.6 kg)  06/10/20 187 lb (84.8 kg)   Constitutional: overweight, in NAD Eyes: PERRLA, EOMI, no exophthalmos ENT: moist mucous membranes, no thyromegaly, no cervical lymphadenopathy Cardiovascular: RRR, No  MRG, + pitting edema B, up to knees Respiratory: CTA B Gastrointestinal: abdomen soft, NT, ND, BS+ Musculoskeletal: no deformities, strength intact in all 4 Skin: moist, warm, no rashes Neurological: + tremor with outstretched hands, DTR normal in all 4  ASSESSMENT: 1. DM2, insulin-dependent, uncontrolled, with complications - CAD - s/p 4 stents 2013 - seeing Dr Burt Knack - PN  Component     Latest Ref Rng 06/09/2016  C-Peptide     0.80-3.85 ng/mL 1.78  Glucose, Fasting     65 - 99 mg/dL 184 (H)  Glutamic Acid Decarb Ab     <5 IU/mL <5  Pancreatic Islet Cell Antibody     < 5 JDF Units <5   2. HL  3. Overweight  PLAN:  1. Patient with longstanding, uncontrolled, type 2 diabetes, with increased variability of blood sugars.  We checked him for type 1 diabetes but the investigation was negative.  Last year he had to come off Ozempic due to price so we were able to restart this 2.5 weeks before last visit.  When I last saw him in clinic, there was a clear trend towards blood sugar improvement, although his sugars were still above target.  We did not increase the dose of Ozempic at that time but discussed that he may need insulin with lunch, also.  He was having a small lunch and I advised him to take some Humalog in case he eats more than 15 g of carbs with this meal.  At that time, HbA1c was improved to 7.3%. -At this visit, sugars are higher, mostly in the 200s, but with occasional sugars at home.  He is not sure why the sugars are higher than before, however, upon questioning, he is taking 10 units less of NPH at night than prescribed at last visit as he forgot that he was taking a higher dose before.  We will increase the NPH at this visit and, since sugars are uniformly elevated throughout the rest of the day, we will also increase the dose of Ozempic.  He is tolerating this well. - I suggested to:  Patient Instructions  Please continue: - Metformin 1000 mg 2x a day, with meals - NPH 30  units in am and increase 35-40 units at bedtime - Humalog 16-20 units 15 min before a meal  Please increase: - Ozempic 1 mg weekly  Please return in 3-4 months with your sugar log.   - we checked his HbA1c: 7.5% (I believe this is lower than expected due to his Fe therapy) - advised to check sugars at different times of the day - 3x a day, rotating check times - advised for yearly eye exams >> he is UTD - return to clinic in 3-4 months     2. HL -Reviewed latest lipid panel from 11/2019: LDL at goal, excellent, as are the rest of his fractions: Lab Results  Component Value Date   CHOL 112 12/11/2019  HDL 46.60 12/11/2019   LDLCALC 52 12/11/2019   LDLDIRECT 103.9 11/05/2013   TRIG 65.0 12/11/2019   CHOLHDL 2 12/11/2019  -Continues Crestor without side effects  3. Overweight -Fortunately, he was able to restart Ozempic in 01/2020, after coming off due to lack of insurance coverage.  This is still expensive for him.  However, he would like to continue.  We will increase the dose of Ozempic at this visit. -We also discussed in the past about cutting out sweets and alcohol. -Before last visit, he lost the next 10 pounds, but he gained 4 pounds since last visit  Philemon Kingdom, MD PhD [facility]

## 2020-07-28 NOTE — Patient Instructions (Signed)
Please continue: - Metformin 1000 mg 2x a day, with meals - NPH 30 units in am and increase 35-40 units at bedtime - Humalog 16-20 units 15 min before a meal  Please increase: - Ozempic 1 mg weekly  Please return in 3-4 months with your sugar log.

## 2020-08-01 ENCOUNTER — Other Ambulatory Visit: Payer: Self-pay | Admitting: Gastroenterology

## 2020-08-10 ENCOUNTER — Other Ambulatory Visit: Payer: Self-pay | Admitting: Gastroenterology

## 2020-08-10 ENCOUNTER — Other Ambulatory Visit: Payer: Self-pay | Admitting: Internal Medicine

## 2020-08-12 ENCOUNTER — Other Ambulatory Visit: Payer: Self-pay | Admitting: Internal Medicine

## 2020-08-12 DIAGNOSIS — Z0120 Encounter for dental examination and cleaning without abnormal findings: Secondary | ICD-10-CM | POA: Diagnosis not present

## 2020-08-17 ENCOUNTER — Other Ambulatory Visit: Payer: Self-pay | Admitting: Internal Medicine

## 2020-08-25 ENCOUNTER — Encounter: Payer: Self-pay | Admitting: Internal Medicine

## 2020-08-26 MED ORDER — OZEMPIC (1 MG/DOSE) 4 MG/3ML ~~LOC~~ SOPN: 1.0000 mg | PEN_INJECTOR | SUBCUTANEOUS | 3 refills | Status: DC

## 2020-08-27 ENCOUNTER — Other Ambulatory Visit: Payer: Self-pay | Admitting: Internal Medicine

## 2020-08-27 MED ORDER — OZEMPIC (0.25 OR 0.5 MG/DOSE) 2 MG/1.5ML ~~LOC~~ SOPN: 0.5000 mg | PEN_INJECTOR | SUBCUTANEOUS | 3 refills | Status: DC

## 2020-08-29 ENCOUNTER — Other Ambulatory Visit: Payer: Self-pay | Admitting: Internal Medicine

## 2020-08-29 MED ORDER — OZEMPIC (0.25 OR 0.5 MG/DOSE) 2 MG/1.5ML ~~LOC~~ SOPN
0.5000 mg | PEN_INJECTOR | SUBCUTANEOUS | 3 refills | Status: DC
Start: 2020-08-29 — End: 2020-08-29

## 2020-08-29 MED ORDER — OZEMPIC (0.25 OR 0.5 MG/DOSE) 2 MG/1.5ML ~~LOC~~ SOPN: 0.5000 mg | PEN_INJECTOR | SUBCUTANEOUS | 3 refills | Status: DC

## 2020-08-29 NOTE — Addendum Note (Signed)
Addended by: Amado Coe on: 08/29/2020 09:34 AM   Modules accepted: Orders

## 2020-09-01 ENCOUNTER — Other Ambulatory Visit: Payer: Self-pay | Admitting: Internal Medicine

## 2020-09-02 ENCOUNTER — Other Ambulatory Visit: Payer: Self-pay | Admitting: Internal Medicine

## 2020-09-02 MED ORDER — OZEMPIC (0.25 OR 0.5 MG/DOSE) 2 MG/1.5ML ~~LOC~~ SOPN
0.5000 mg | PEN_INJECTOR | SUBCUTANEOUS | 3 refills | Status: DC
Start: 2020-09-02 — End: 2021-09-06

## 2020-09-08 ENCOUNTER — Ambulatory Visit (INDEPENDENT_AMBULATORY_CARE_PROVIDER_SITE_OTHER): Payer: Medicare Other

## 2020-09-08 ENCOUNTER — Ambulatory Visit (INDEPENDENT_AMBULATORY_CARE_PROVIDER_SITE_OTHER): Payer: Medicare Other | Admitting: Podiatry

## 2020-09-08 ENCOUNTER — Encounter: Payer: Self-pay | Admitting: Podiatry

## 2020-09-08 ENCOUNTER — Ambulatory Visit: Payer: Medicare Other

## 2020-09-08 ENCOUNTER — Other Ambulatory Visit: Payer: Self-pay

## 2020-09-08 DIAGNOSIS — M7751 Other enthesopathy of right foot: Secondary | ICD-10-CM | POA: Diagnosis not present

## 2020-09-08 DIAGNOSIS — M7752 Other enthesopathy of left foot: Secondary | ICD-10-CM | POA: Diagnosis not present

## 2020-09-08 DIAGNOSIS — M779 Enthesopathy, unspecified: Secondary | ICD-10-CM

## 2020-09-08 DIAGNOSIS — M778 Other enthesopathies, not elsewhere classified: Secondary | ICD-10-CM

## 2020-09-10 NOTE — Progress Notes (Signed)
Subjective:   Patient ID: David [name], male   DOB: 69 y.o.   MRN: [id number]   HPI Patient presents stating he has developed pain on the top of both feet that is sore with inflammation associated with it.  It is localized to both feet and has been ongoing for a period of time and makes it hard to wear shoe gear at times.  Does not remember injury   ROS      Objective:  Physical Exam  Neurovascular status intact with extensor inflammation occurring bilateral with what appears to be midfoot arthritic changes     Assessment:  Extensor tendinitis bilateral     Plan:  Reviewed condition also arthritis and x-rays and today I did sterile prep and injected the extensor complex bilateral 3 mg Kenalog 5 mg Xylocaine advised on anti-inflammatories and topicals and reappoint to recheck  X-rays indicate there is some midtarsal joint arthritis bilateral with spurring consistent with the problem patient has

## 2020-09-22 ENCOUNTER — Other Ambulatory Visit: Payer: Self-pay

## 2020-09-22 ENCOUNTER — Ambulatory Visit: Payer: Medicare Other | Admitting: Cardiovascular Disease

## 2020-09-22 ENCOUNTER — Encounter: Payer: Self-pay | Admitting: Cardiovascular Disease

## 2020-09-22 VITALS — BP 100/74 | HR 71 | Ht 69.0 in | Wt 187.2 lb

## 2020-09-22 DIAGNOSIS — E782 Mixed hyperlipidemia: Secondary | ICD-10-CM | POA: Diagnosis not present

## 2020-09-22 DIAGNOSIS — I2510 Atherosclerotic heart disease of native coronary artery without angina pectoris: Secondary | ICD-10-CM | POA: Diagnosis not present

## 2020-09-22 DIAGNOSIS — I10 Essential (primary) hypertension: Secondary | ICD-10-CM | POA: Diagnosis not present

## 2020-09-22 NOTE — Progress Notes (Signed)
Cardiology Office Note:    Date:  09/22/2020   ID:  David [name], DOB 03-30-1951, MRN [id number]  PCP:  [name], MD  [place] Cardiologist:  Sherren Mocha, MD  [place] Electrophysiologist:  None   Referring MD: [name], MD   Chief Complaint  Patient presents with  . Coronary Artery Disease    History of Present Illness:    David [name] is a 69 y.o. male with a hx of coronary artery disease with history of two-vessel PCI in 2010.  Comorbid conditions have included hypertension, diabetes, and mixed hyperlipidemia.  In 2019 he presented with shortness of breath and chest discomfort and was found to have a markedly abnormal stress test.  He was brought in for heart catheterization but found to have a hemoglobin of 5.6.  He was ultimately diagnosed with cirrhosis, grade 2 esophageal varices, and nonbleeding gastric ulcers.  His beta-blocker was changed to nadolol.  He ultimately underwent heart catheterization that demonstrated patent stents in the circumflex and right coronary artery which with only mild restenosis.  Medical therapy was recommended.  He was last seen in our office in November 2020 by David Drown, NP.  The patient has been followed closely by Dr. Ardis Hughs over the past year for management of his cirrhosis.Medical therapy includes propranolol, spironolactone, and furosemide.  Patient is here alone today.  He is doing pretty well from a cardiac perspective.  He denies chest pain, chest pressure, shortness of breath.  His leg and abdominal swelling have resolved since he was started on diuretic therapy.  He denies orthopnea, PND, or heart palpitations.  He continues to drink alcohol struggles with the idea of quitting.  He understands that he needs to do that in the setting of his cirrhosis.  Past Medical History:  Diagnosis Date  . Allergy   . Anemia   . Arthritis   . CAD (coronary artery disease)    s/p bifurcation stenting (DES x2) and  stenting bifurcation lesion distal RCA (1 DES)  . Cataract   . Cirrhosis ([place])   . Depression   . Diabetes mellitus   . Esophageal varices ([place])   . GERD (gastroesophageal reflux disease)   . Hyperlipemia   . Hypertension   . Myocardial infarction ([place])   . Sleep apnea    CPAP    Past Surgical History:  Procedure Laterality Date  . BIOPSY  09/01/2018   Procedure: BIOPSY;  Surgeon: Mauri Pole, MD;  Location: [facility];  Service: Endoscopy;;  . CATARACT EXTRACTION Bilateral   . CORONARY ANGIOPLASTY WITH STENT PLACEMENT   March 2010   Dr. Olevia Perches  . ESOPHAGOGASTRODUODENOSCOPY (EGD) WITH PROPOFOL N/A 09/01/2018   Procedure: ESOPHAGOGASTRODUODENOSCOPY (EGD) WITH PROPOFOL;  Surgeon: Mauri Pole, MD;  Location: [facility];  Service: Endoscopy;  Laterality: N/A;  . LEFT HEART CATH AND CORONARY ANGIOGRAPHY N/A 08/31/2018   Procedure: LEFT HEART CATH AND CORONARY ANGIOGRAPHY;  Surgeon: Sherren Mocha, MD;  Location: [facility];  Service: Cardiovascular;  Laterality: N/A;  . TONSILLECTOMY  1959    Current Medications: Current Meds  Medication Sig  . ALPHA LIPOIC ACID PO Take 1 tablet by mouth daily.  . Ascorbic Acid (VITAMIN C) 1000 MG tablet Take 1,000 mg by mouth daily.  [name] b complex vitamins tablet Take 1 tablet by mouth daily.  . Cholecalciferol (D3 VITAMIN PO) Take 1 tablet by mouth daily.  . CHROMIUM PO Take 1 tablet by mouth daily.  . Coenzyme Q10 (  CO Q 10) 100 MG CAPS Take 1 capsule by mouth daily.  . ferrous sulfate 325 (65 FE) MG tablet Take 325 mg by mouth daily with breakfast.  . furosemide (LASIX) 20 MG tablet Take 1 tablet by mouth once daily  . glucose blood (ONE TOUCH ULTRA TEST) test strip Use to test blood sugar 2 times daily as instructed. Dx: E11.59, E11.65  . HUMALOG KWIKPEN 200 UNIT/ML KwikPen INJECT 8-12 UNITS INTO THE SKIN THREE TIMES DAILY BEFORE MEALS  . insulin NPH Human (NOVOLIN N RELION) 100 UNIT/ML injection Inject under skin 30  units in a.m. and 35-40 units at bedtime  . Insulin Pen Needle 32G X 4 MM MISC Use 4x a day - with insulin  . Insulin Syringe-Needle U-100 (RELION INSULIN SYRINGE) 31G X 15/64" 0.5 ML MISC Inject twice a day  . losartan (COZAAR) 50 MG tablet TAKE 1 & 1/2 (ONE & ONE-HALF) TABLETS BY MOUTH ONCE DAILY  . metFORMIN (GLUCOPHAGE) 1000 MG tablet Take 1 tablet (1,000 mg total) by mouth 2 (two) times daily with a meal.  . Methylsulfonylmethane (MSM PO) Take 1 tablet by mouth daily.  . Multiple Vitamin (MULTIVITAMIN WITH MINERALS) TABS tablet Take 1 tablet by mouth at bedtime.  Glory Rosebush DELICA LANCETS FINE MISC Use to test blood sugar 2 times daily as instructed. Dx: E11.59, E11.65  . pantoprazole (PROTONIX) 40 MG tablet Take 1 tablet by mouth twice daily  . PARoxetine (PAXIL) 20 MG tablet TAKE 2 TABLETS BY MOUTH ONCE DAILY FOR DEPRESSION  . propranolol (INDERAL) 60 MG tablet Take 1 tablet (60 mg total) by mouth 2 (two) times daily.  . rosuvastatin (CRESTOR) 10 MG tablet Take 1 tablet by mouth once daily  . Semaglutide,0.25 or 0.5MG /DOS, (OZEMPIC, 0.25 OR 0.5 MG/DOSE,) 2 MG/1.5ML SOPN Inject 0.5 mg into the skin once a week.  . sildenafil (VIAGRA) 100 MG tablet Take 1 tablet (100 mg total) by mouth daily as needed for erectile dysfunction.  [name] spironolactone (ALDACTONE) 50 MG tablet Take 1 tablet by mouth once daily  . triamcinolone cream (KENALOG) 0.1 % APPLY TOPICALLY TO SCALP TWO TIMES DAILY AS NEEDED     Allergies:   Venlafaxine   Social History   Socioeconomic History  . Marital status: Legally Separated    Spouse name: Not on file  . Number of children: 2  . Years of education: Not on file  . Highest education level: Not on file  Occupational History  . Occupation: Patent examiner buildings    Comment: site closed  . Occupation: Engineering geologist    Comment: part time  Tobacco Use  . Smoking status: Former Smoker    Types: Cigarettes    Quit date: 11/30/1983    Years since  quitting: 36.8  . Smokeless tobacco: Never Used  Vaping Use  . Vaping Use: Never used  Substance and Sexual Activity  . Alcohol use: Yes    Alcohol/week: 20.0 standard drinks    Types: 20 Standard drinks or equivalent per week    Comment: beer  . Drug use: No  . Sexual activity: Not on file  Other Topics Concern  . Not on file  Social History Narrative   No living will   Would want want son [name], as health care POA   Would accept resuscitation   Not sure about tube feeds--but doesn't want if cognitively unaware   Social Determinants of Health   Financial Resource Strain:   . Difficulty of Paying Living Expenses: Not  on file  Food Insecurity:   . Worried About Charity fundraiser in the Last Year: Not on file  . Ran Out of Food in the Last Year: Not on file  Transportation Needs:   . Lack of Transportation (Medical): Not on file  . Lack of Transportation (Non-Medical): Not on file  Physical Activity:   . Days of Exercise per Week: Not on file  . Minutes of Exercise per Session: Not on file  Stress:   . Feeling of Stress : Not on file  Social Connections:   . Frequency of Communication with Friends and Family: Not on file  . Frequency of Social Gatherings with Friends and Family: Not on file  . Attends Religious Services: Not on file  . Active Member of Clubs or Organizations: Not on file  . Attends Archivist Meetings: Not on file  . Marital Status: Not on file     Family History: The patient's family history includes Alcohol abuse in his mother; Cancer in his brother and father; Diabetes in his father; Heart failure in his father and mother; Schizophrenia in his son; Uveitis in his son. There is no history of Colon cancer, Esophageal cancer, Stomach cancer, Liver cancer, Pancreatic cancer, or Rectal cancer.  ROS:   Please see the history of present illness.    All other systems reviewed and are negative.  EKGs/Labs/Other Studies Reviewed:    The following  studies were reviewed today: Echo 08-28-2018: Study Conclusions   - Left ventricle: The cavity size was normal. Systolic function was  normal. The estimated ejection fraction was in the range of 60%  to 65%. Wall motion was normal; there were no regional wall  motion abnormalities.  - Aortic valve: There was no regurgitation.  - Aorta: Ascending aortic diameter: 40 mm (S).  - Ascending aorta: The ascending aorta was mildly dilated.  - Mitral valve: There was trivial regurgitation.  - Left atrium: The atrium was moderately dilated.  - Right ventricle: The cavity size was moderately dilated. Wall  thickness was normal. Systolic function was normal.  - Right atrium: The atrium was mildly dilated.  - Atrial septum: No defect or patent foramen ovale was identified.  - Tricuspid valve: There was trivial regurgitation.  - Pulmonic valve: There was no significant regurgitation.  - Pulmonary arteries: PA peak pressure: 32 mm Hg (S).   Impressions:   - Normal to hyperdynamic LV function, no regional wall motion  abnormalities. Mildly dilated ascending aorta. Moderately dilated  RV with normal to hyperdynamic function.   Cardiac Cath 08-31-2018: Conclusion    Prox RCA to Mid RCA lesion is 40% stenosed.  Ost RPDA to RPDA lesion is 40% stenosed.  Post Atrio lesion is 50% stenosed.  Ost 2nd Mrg to 2nd Mrg lesion is 25% stenosed.  Mid Cx to Dist Cx lesion is 10% stenosed.  Previously placed Prox Cx to Mid Cx stent (unknown type) is widely patent.   1. Patent stents in the left circumflex with mild restenosis 2. Patent stents in the RCA with mild in stent restenosis 3. Widely patent left main and LAD 4. Mildly elevated LVEDP  Stable coronary anatomy without high-grade coronary stenosis. Medical therapy for CAD. OK to proceed with GI evaluation as needed.   EKG:  EKG is ordered today.  The ekg ordered today demonstrates NSr 65 bpm, normal tracing  Recent  Labs: 06/23/2020: ALT 44; BUN 25; Creatinine, Ser 1.20; Hemoglobin 10.2; Platelets 95.0; Potassium 4.8; Sodium 137  Recent Lipid Panel    Component Value Date/Time   CHOL 112 12/11/2019 1105   TRIG 65.0 12/11/2019 1105   HDL 46.60 12/11/2019 1105   CHOLHDL 2 12/11/2019 1105   VLDL 13.0 12/11/2019 1105   LDLCALC 52 12/11/2019 1105   LDLDIRECT 103.9 11/05/2013 1249     Risk Assessment/Calculations:       Physical Exam:    VS:  BP 100/74   Pulse 71   Ht 5\' 9"  (1.753 m)   Wt 187 lb 3.2 oz (84.9 kg)   SpO2 100%   BMI 27.64 kg/m     Wt Readings from Last 3 Encounters:  09/22/20 187 lb 3.2 oz (84.9 kg)  07/28/20 190 lb (86.2 kg)  06/23/20 186 lb 6.4 oz (84.6 kg)     GEN:  Well nourished, well developed in no acute distress HEENT: Normal NECK: No JVD; No carotid bruits LYMPHATICS: No lymphadenopathy CARDIAC: RRR, no murmurs, rubs, gallops RESPIRATORY:  Clear to auscultation without rales, wheezing or rhonchi  ABDOMEN: Soft, non-tender, non-distended MUSCULOSKELETAL:  No edema; No deformity  SKIN: Warm and dry NEUROLOGIC:  Alert and oriented x 3 PSYCHIATRIC:  Normal affect   ASSESSMENT:    1. Coronary artery disease involving native coronary artery of native heart without angina pectoris   2. Essential hypertension   3. Mixed hyperlipidemia    PLAN:    In order of problems listed above:  1. Stable without symptoms of angina.  Considering the presence of cirrhosis, esophageal varices, and history of gastrointestinal bleeding, I think it is probably best for him to stay off of antiplatelet therapy.  We discussed this today.  He will stop taking aspirin.  Otherwise we will continue on a statin drug. 2. Blood pressure well controlled on current therapy. 3. Lipids reviewed with cholesterol 112, HDL 47, LDL 52.  No changes made.  Continues on rosuvastatin 10 mg daily.   Medication Adjustments/Labs and Tests Ordered: Current medicines are reviewed at length with the  patient today.  Concerns regarding medicines are outlined above.  Orders Placed This Encounter  Procedures  . EKG 12-Lead   No orders of the defined types were placed in this encounter.   Patient Instructions  Medication Instructions:  1) Make sure you are NOT taking aspirin *If you need a refill on your cardiac medications before your next appointment, please call your pharmacy*   Follow-Up: At [facility], you and your health needs are our priority.  As part of our continuing mission to provide you with exceptional heart care, we have created designated Provider Care Teams.  These Care Teams include your primary Cardiologist (physician) and Advanced Practice Providers (APPs -  Physician Assistants and Nurse Practitioners) who all work together to provide you with the care you need, when you need it. Your next appointment:   12 month(s) The format for your next appointment:   In Person Provider:   You may see Sherren Mocha, MD or one of the following Advanced Practice Providers on your designated Care Team:    Richardson Dopp, PA-C  Robbie Lis, [place]      Signed, Sherren Mocha, MD  09/22/2020 1:24 PM    [facility]

## 2020-09-22 NOTE — Patient Instructions (Signed)
Medication Instructions:  1) Make sure you are NOT taking aspirin *If you need a refill on your cardiac medications before your next appointment, please call your pharmacy*   Follow-Up: At [facility], you and your health needs are our priority.  As part of our continuing mission to provide you with exceptional heart care, we have created designated Provider Care Teams.  These Care Teams include your primary Cardiologist (physician) and Advanced Practice Providers (APPs -  Physician Assistants and Nurse Practitioners) who all work together to provide you with the care you need, when you need it. Your next appointment:   12 month(s) The format for your next appointment:   In Person Provider:   You may see Sherren Mocha, MD or one of the following Advanced Practice Providers on your designated Care Team:    Richardson Dopp, PA-C  Vin [place], [place]

## 2020-10-02 ENCOUNTER — Ambulatory Visit (INDEPENDENT_AMBULATORY_CARE_PROVIDER_SITE_OTHER): Payer: Medicare Other

## 2020-10-02 ENCOUNTER — Other Ambulatory Visit: Payer: Self-pay

## 2020-10-02 DIAGNOSIS — Z23 Encounter for immunization: Secondary | ICD-10-CM

## 2020-10-12 ENCOUNTER — Other Ambulatory Visit: Payer: Self-pay | Admitting: Internal Medicine

## 2020-10-27 ENCOUNTER — Telehealth: Payer: Self-pay

## 2020-10-27 NOTE — Telephone Encounter (Signed)
-----   Message from Stevan Born, [place] sent at 06/23/2020  2:48 PM EDT ----- Regarding: 6 months Pt needs 6 month appt with Dr Lenna Sciara in Jan 2022.  Alcoholic cirr

## 2020-10-27 NOTE — Telephone Encounter (Signed)
Patient returned call and spoke with Cory Roughen to schedule appointment on 12-30-2020 with Dr Ardis Hughs.

## 2020-10-27 NOTE — Telephone Encounter (Signed)
Left message on voicemail for patient to return call to office to schedule 6 month follow up appointment with Dr Ardis Hughs in Jan 2022.  Will continue efforts.

## 2020-10-28 ENCOUNTER — Encounter: Payer: Self-pay | Admitting: Orthopaedic Surgery

## 2020-10-28 ENCOUNTER — Ambulatory Visit: Payer: Medicare Other | Admitting: Orthopaedic Surgery

## 2020-10-28 ENCOUNTER — Ambulatory Visit: Payer: Self-pay

## 2020-10-28 ENCOUNTER — Other Ambulatory Visit: Payer: Self-pay

## 2020-10-28 DIAGNOSIS — M5441 Lumbago with sciatica, right side: Secondary | ICD-10-CM

## 2020-10-28 DIAGNOSIS — G8929 Other chronic pain: Secondary | ICD-10-CM

## 2020-10-28 DIAGNOSIS — M79605 Pain in left leg: Secondary | ICD-10-CM

## 2020-10-28 DIAGNOSIS — M5442 Lumbago with sciatica, left side: Secondary | ICD-10-CM | POA: Diagnosis not present

## 2020-10-28 DIAGNOSIS — M79604 Pain in right leg: Secondary | ICD-10-CM | POA: Diagnosis not present

## 2020-10-28 MED ORDER — HYDROCODONE-ACETAMINOPHEN 5-325 MG PO TABS
1.0000 | ORAL_TABLET | Freq: Four times a day (QID) | ORAL | 0 refills | Status: DC | PRN
Start: 2020-10-28 — End: 2020-11-13

## 2020-10-28 NOTE — Progress Notes (Signed)
Office Visit Note   Patient: David [name]           Date of Birth: 13-Sep-1951           MRN: [id number] Visit Date: 10/28/2020              Requested by: [name], MD [place],  [place] [id number] PCP: [name], MD   Assessment & Plan: Visit Diagnoses:  1. Chronic bilateral low back pain with bilateral sciatica   2. Bilateral leg pain     Plan: Due to the fact the patient is failed conservative treatment which include medications, time and formal physical therapy and continues to have worsening back pain and is awakening him recommend MRI of the lumbar spine to evaluate for nerve compression.  He is given Norco to take mainly at night for back pain.  He understands not to drive or operate heavy machinery while on the Norco.  See him back 1 week after the MRI to go over results discuss further treatment.  Follow-Up Instructions: Return After MRI.   Orders:  Orders Placed This Encounter  Procedures  . XR Lumbar Spine 2-3 Views   Meds ordered this encounter  Medications  . HYDROcodone-acetaminophen (NORCO) 5-325 MG tablet    Sig: Take 1 tablet by mouth every 6 (six) hours as needed for moderate pain.    Dispense:  20 tablet    Refill:  0      Procedures: No procedures performed   Clinical Data: No additional findings.   Subjective: Chief Complaint  Patient presents with  . Lower Back - Pain    HPI Mr. Cotten returns today due to continued low back pain.  We saw him back in 2020 at that time we had ordered an MRI however it was denied due to the fact the patient had no conservative treatment.  He has since then gone to physical therapy for 6-8 sessions he states that while in the session it helped a little bit with his back pain but his pain came back soon after leaving the physical therapy department.  He continues to have low back pain with radiation to the right buttocks.  Also achy pain into both thighs anteriorly and laterally.   He notes some numbness at the proximal inner thighs.  Pain in the low back does awaken him.  He has had no change in bowel bladder function.  He continues to do the home exercise program at home.  He states he is unable to stand for greater than 30 minutes due to the pain in both legs.  Currently taking nothing for the pain coming from his back.  Review of Systems Negative for fevers or chills.  Please see HPI otherwise negative or noncontributory.  Objective: Vital Signs: There were no vitals taken for this visit.  Physical Exam Constitutional:      Appearance: He is not ill-appearing or diaphoretic.  Pulmonary:     Effort: Pulmonary effort is normal.  Neurological:     Mental Status: He is alert and oriented to person, place, and time.  Psychiatric:        Mood and Affect: Mood normal.     Ortho Exam Bilateral hips excellent range of motion without pain.  Deep tendon reflexes are 2+ at the knees and ankles and equal symmetric.  Decreased sensation bilateral feet due to neuropathy right slightly worse than left.  Dorsal pedal pulses are 2+ bilaterally and equal symmetric.  5 out of 5 strength throughout the lower extremities against resistance.  Negative straight leg raise bilaterally.  Tight hamstrings bilaterally.  Forward flexion lumbar spine is limited coming within 6 to 8 inches of touching his toes.  He has also limited extension of the lumbar spine with pain. Specialty Comments:  No specialty comments available.  Imaging: XR Lumbar Spine 2-3 Views  Result Date: 10/28/2020 2 views lumbar spine: Show severe degenerative changes at multiple levels.  Scoliosis of the spine.  No acute fractures.  Loss of lordotic curvature.  Mild progression degenerative changes from previous films in 2020.    PMFS History: Patient Active Problem List   Diagnosis Date Noted  . BPH associated with nocturia 06/10/2020  . 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 Hip pain  08/07/2019  . History of esophageal varices 09/15/2018  . Alcoholic cirrhosis of liver with ascites ([place])   . Secondary esophageal varices without bleeding ([place])   . Anemia 08/31/2018  . Overweight 09/13/2017  . Poorly controlled type 2 diabetes mellitus with circulatory disorder ([place]) 01/18/2017  . Medial epicondylitis of elbow, left 08/03/2016  . Elevated liver function tests 07/13/2016  . Thrombocytopenia ([place]) 07/13/2016  . Trigger middle finger of left hand 03/02/2016  . Bilateral hand pain 12/02/2015  . Primary osteoarthritis of both first carpometacarpal joints 12/02/2015  . Advance directive discussed with patient 05/16/2015  . Routine general medical examination at a health care facility 02/16/2011  . Erectile dysfunction 02/16/2011  . Essential hypertension 12/21/2010  . Hyperlipidemia LDL goal <70 06/24/2009  . Osteoarthritis, multiple sites 04/16/2009  . CAD (coronary artery disease) 02/04/2009  . Major depressive disorder, recurrent episode ([place]) 08/21/2008  . ALLERGIC RHINITIS 08/21/2008  . Sleep apnea 08/21/2008   Past Medical History:  Diagnosis Date  . Allergy   . Anemia   . Arthritis   . CAD (coronary artery disease)    s/p bifurcation stenting (DES x2) and stenting bifurcation lesion distal RCA (1 DES)  . Cataract   . Cirrhosis ([place])   . Depression   . Diabetes mellitus   . Esophageal varices ([place])   . GERD (gastroesophageal reflux disease)   . Hyperlipemia   . Hypertension   . Myocardial infarction ([place])   . Sleep apnea    CPAP    Family History  Problem Relation Age of Onset  . Heart failure Father        Deceased 28 y/o  . Diabetes Father   . Cancer Father        Prostate  . Heart failure Mother        Deceased 7 y/o  . Alcohol abuse Mother   . Schizophrenia Son        Paranoid  . Uveitis Son   . Cancer Brother        prostate  . Colon cancer Neg Hx   . Esophageal cancer Neg Hx   . Stomach cancer Neg Hx   . Liver cancer Neg Hx   .  Pancreatic cancer Neg Hx   . Rectal cancer Neg Hx     Past Surgical History:  Procedure Laterality Date  . BIOPSY  09/01/2018   Procedure: BIOPSY;  Surgeon: Mauri Pole, MD;  Location: [place];  Service: Endoscopy;;  . CATARACT EXTRACTION Bilateral   . CORONARY ANGIOPLASTY WITH STENT PLACEMENT   March 2010   Dr. Olevia Perches  . ESOPHAGOGASTRODUODENOSCOPY (EGD) WITH PROPOFOL N/A 09/01/2018   Procedure: ESOPHAGOGASTRODUODENOSCOPY (EGD) WITH PROPOFOL;  Surgeon: Mauri Pole, MD;  [facility];  Service: Endoscopy;  Laterality: N/A;  . LEFT HEART CATH AND CORONARY ANGIOGRAPHY N/A 08/31/2018   Procedure: LEFT HEART CATH AND CORONARY ANGIOGRAPHY;  Surgeon: Sherren Mocha, MD;  Location: [place] [facility];  Service: Cardiovascular;  Laterality: N/A;  . TONSILLECTOMY  1959   Social History   Occupational History  . Occupation: Patent examiner buildings    Comment: site closed  . Occupation: Engineering geologist    Comment: part time  Tobacco Use  . Smoking status: Former Smoker    Types: Cigarettes    Quit date: 11/30/1983    Years since quitting: 36.9  . Smokeless tobacco: Never Used  Vaping Use  . Vaping Use: Never used  Substance and Sexual Activity  . Alcohol use: Yes    Alcohol/week: 20.0 standard drinks    Types: 20 Standard drinks or equivalent per week    Comment: beer  . Drug use: No  . Sexual activity: Not on file

## 2020-10-28 NOTE — Addendum Note (Signed)
Addended by: Robyne Peers on: 10/28/2020 10:21 AM   Modules accepted: Orders

## 2020-10-30 ENCOUNTER — Telehealth: Payer: Self-pay | Admitting: Orthopaedic Surgery

## 2020-10-30 NOTE — Telephone Encounter (Signed)
Called pt 1X left vm to call and set appt with Dr. Ninfa Linden after 12/21 for MRI review.

## 2020-10-31 ENCOUNTER — Telehealth: Payer: Self-pay | Admitting: Orthopedic Surgery

## 2020-10-31 NOTE — Telephone Encounter (Signed)
Called and left a 2nd vm for pt to call and set appt for MRI review after 12/21 with Marlou Sa.

## 2020-11-03 ENCOUNTER — Other Ambulatory Visit: Payer: Self-pay

## 2020-11-03 ENCOUNTER — Encounter: Payer: Self-pay | Admitting: Internal Medicine

## 2020-11-03 ENCOUNTER — Ambulatory Visit: Payer: Medicare Other | Admitting: Internal Medicine

## 2020-11-03 VITALS — BP 120/78 | HR 76 | Ht 69.0 in | Wt 190.0 lb

## 2020-11-03 DIAGNOSIS — E1159 Type 2 diabetes mellitus with other circulatory complications: Secondary | ICD-10-CM | POA: Diagnosis not present

## 2020-11-03 DIAGNOSIS — E663 Overweight: Secondary | ICD-10-CM | POA: Diagnosis not present

## 2020-11-03 DIAGNOSIS — E785 Hyperlipidemia, unspecified: Secondary | ICD-10-CM | POA: Diagnosis not present

## 2020-11-03 DIAGNOSIS — E1165 Type 2 diabetes mellitus with hyperglycemia: Secondary | ICD-10-CM

## 2020-11-03 LAB — POCT GLYCOSYLATED HEMOGLOBIN (HGB A1C): Hemoglobin A1C: 7.5 % — AB (ref 4.0–5.6)

## 2020-11-03 MED ORDER — HUMALOG KWIKPEN 200 UNIT/ML ~~LOC~~ SOPN
PEN_INJECTOR | SUBCUTANEOUS | 3 refills | Status: DC
Start: 2020-11-03 — End: 2021-09-06

## 2020-11-03 NOTE — Progress Notes (Signed)
Patient ID: David [name], male   DOB: 1951-05-13, 69 y.o.   MRN: [id number]   This visit occurred during the SARS-CoV-2 public health emergency.  Safety protocols were in place, including screening questions prior to the visit, additional usage of staff PPE, and extensive cleaning of exam room while observing appropriate contact time as indicated for disinfecting solutions.   HPI: David [name] is a 69 y.o.-year-old male, returning for f/u for DM2, dx in ~2008, insulin-dependent since 09/2015, uncontrolled, with complications (CAD - s/p stents in 2013, PN). Last visit 3 months ago.  Earlier in the year, he and his wife of 40 years separated and at last visit he was living outside of his home.   Earlier this year he was diagnosed with cirrhosis.  He does have a history of heavy alcohol use. he has anemia and decreased platelets.  He takes iron.  He admits he continues to drink alcohol.  Reviewed HbA1c levels: Lab Results  Component Value Date   HGBA1C 7.5 (A) 07/28/2020   HGBA1C 7.3 (A) 03/18/2020   HGBA1C 8.9 (H) 12/11/2019  03/05/2016: HbA1c 9.1%  He is on: - Metformin 1000 mg 2x a day, with meals - Lantus 40 >> 48 units at bedtime >> NPH 30 units in a.m. and  (25-30 units) units at night >> 30 units in a.m. and 35-40 units at night - Humalog 8-12 >> 16 >> 14-20 >> 16-20 >> 14-16 units before a meal -but he seldom takes this, approximately 2-3 times a week - Ozempic 0.5 >> 1 >> 0.5 mg weekly (cost) He was on Ozempic from 01/2019 to 08/2019 but had to come off due to price.  We restarted this in 01/2020. He was on Bydureon 2 mg weekly - started 01/30/2017 >> stopped 04/2017 b/c price - could not restart  Pt checks his sugars twice a day per review of his log: - am: 138-253 >> 115, 119, 177-255, 285 >> 115, 174-282 - 2h after b'fast: n/c  - before lunch: 85-187, 201 >> 158, 327 >> n/c >> n/c - 2h after lunch: n/c - before dinner:  121-185, 229 >> 125-253 >> 136, 148-222, 267, 295 - 2h  after dinner: n/c - bedtime:  126-209, 225-240 (candy), 261 >> 160-215 >> n/c - nighttime: n/c Lowest sugar was 164 >> 79 >> 90 >> 115 >> 115; it is unclear at which level he has hypoglycemia awareness. Highest sugar was 329 >> 261 >> 253 >> 285 >> 295.  Glucometer: One Touch  Pt's meals are: - Breakfast: protein shake - Lunch: n/a - Dinner: meat + veggies /potatoes or salad - Snacks: no Diet sodas, ice tea with splenda.  No regular sodas.  -+ CKD, last BUN/creatinine:  Lab Results  Component Value Date   BUN 25 (H) 06/23/2020   CREATININE 1.20 06/23/2020  On losartan. -+ HL; last set of lipids: Lab Results  Component Value Date   CHOL 112 12/11/2019   HDL 46.60 12/11/2019   LDLCALC 52 12/11/2019   LDLDIRECT 103.9 11/05/2013   TRIG 65.0 12/11/2019   CHOLHDL 2 12/11/2019  On Crestor 10. - last eye exam was in 11/2019: No DR.  Has previous history of cataract surgery. -he has numbness and tingling in his feet  He also has HTN, depression.  He has a history of anabolic steroid use for muscle building.  He had a cardiac cath 08/2018 for presyncope: Stable coronary anatomy without high-grade coronary stenosis.  He is on medical therapy for CAD.  He  was found to be anemic (Hb 8.1) >> EGD report reviewed:  - Non-bleeding grade II esophageal varices. - Non-bleeding gastric ulcers with a clean ulcer base (Forrest Class III). Biopsied. - Erythematous duodenopathy. - Normal second portion of the duodenum.  ROS: Constitutional: no weight gain/no weight loss, no fatigue, no subjective hyperthermia, no subjective hypothermia Eyes: no blurry vision, no xerophthalmia ENT: no sore throat, no nodules palpated in neck, no dysphagia, no odynophagia, no hoarseness Cardiovascular: no CP/no SOB/no palpitations/no leg swelling Respiratory: no cough/no SOB/no wheezing Gastrointestinal: no N/no V/no D/no C/no acid reflux Musculoskeletal: no muscle aches/no joint aches Skin: no rashes, no  hair loss Neurological: + tremors/+ numbness/+ tingling/no dizziness  I reviewed pt's medications, allergies, PMH, social hx, family hx, and changes were documented in the history of present illness. Otherwise, unchanged from my initial visit note.    Past Medical History:  Diagnosis Date  . Allergy   . Anemia   . Arthritis   . CAD (coronary artery disease)    s/p bifurcation stenting (DES x2) and stenting bifurcation lesion distal RCA (1 DES)  . Cataract   . Cirrhosis ([place])   . Depression   . Diabetes mellitus   . Esophageal varices ([place])   . GERD (gastroesophageal reflux disease)   . Hyperlipemia   . Hypertension   . Myocardial infarction ([place])   . Sleep apnea    CPAP   Past Surgical History:  Procedure Laterality Date  . BIOPSY  09/01/2018   Procedure: BIOPSY;  Surgeon: Mauri Pole, MD;  Location: [facility];  Service: Endoscopy;;  . CATARACT EXTRACTION Bilateral   . CORONARY ANGIOPLASTY WITH STENT PLACEMENT   March 2010   Dr. Olevia Perches  . ESOPHAGOGASTRODUODENOSCOPY (EGD) WITH PROPOFOL N/A 09/01/2018   Procedure: ESOPHAGOGASTRODUODENOSCOPY (EGD) WITH PROPOFOL;  Surgeon: Mauri Pole, MD;  Location: [facility];  Service: Endoscopy;  Laterality: N/A;  . LEFT HEART CATH AND CORONARY ANGIOGRAPHY N/A 08/31/2018   Procedure: LEFT HEART CATH AND CORONARY ANGIOGRAPHY;  Surgeon: Sherren Mocha, MD;  Location: [facility];  Service: Cardiovascular;  Laterality: N/A;  . TONSILLECTOMY  1959   Social History   Social History  . Marital Status: Married    Spouse Name: N/A  . Number of Children: 2   Occupational History  . 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 Smoking status: Former Smoker    Types: Cigarettes    Quit date: 11/30/1983  . Smokeless tobacco: Never Used  . Alcohol Use:  beer, 3-4 times a week, 6-8 drinks at the time       . Drug Use: No   Social History Narrative   No  living will   Would want wife, then son [name], as health care POA   Would accept resuscitation   Not sure about tube feeds   Current Outpatient Medications on File Prior to Visit  Medication Sig Dispense Refill  . ALPHA LIPOIC ACID PO Take 1 tablet by mouth daily.    . Ascorbic Acid (VITAMIN C) 1000 MG tablet Take 1,000 mg by mouth daily.    [name] b complex vitamins tablet Take 1 tablet by mouth daily.    . Cholecalciferol (D3 VITAMIN PO) Take 1 tablet by mouth daily.    . Coenzyme Q10 (CO Q 10) 100 MG CAPS Take 1 capsule by mouth daily.    . ferrous sulfate 325 (65 FE) MG  tablet Take 325 mg by mouth daily with breakfast.    . furosemide (LASIX) 20 MG tablet Take 1 tablet by mouth once daily 90 tablet 1  . glucose blood (ONE TOUCH ULTRA TEST) test strip Use to test blood sugar 2 times daily as instructed. Dx: E11.59, E11.65 100 each 5  . HUMALOG KWIKPEN 200 UNIT/ML KwikPen INJECT 8-12 UNITS INTO THE SKIN THREE TIMES DAILY BEFORE MEALS 12 mL 0  . HYDROcodone-acetaminophen (NORCO) 5-325 MG tablet Take 1 tablet by mouth every 6 (six) hours as needed for moderate pain. 20 tablet 0  . insulin NPH Human (NOVOLIN N RELION) 100 UNIT/ML injection Inject under skin 30 units in a.m. and 35-40 units at bedtime 20 mL 11  . Insulin Pen Needle 32G X 4 MM MISC Use 4x a day - with insulin 200 each 3  . Insulin Syringe-Needle U-100 (RELION INSULIN SYRINGE) 31G X 15/64" 0.5 ML MISC Inject twice a day 200 each 3  . losartan (COZAAR) 50 MG tablet TAKE 1 & 1/2 (ONE & ONE-HALF) TABLETS BY MOUTH ONCE DAILY 135 tablet 3  . metFORMIN (GLUCOPHAGE) 1000 MG tablet Take 1 tablet (1,000 mg total) by mouth 2 (two) times daily with a meal. 180 tablet 3  . Methylsulfonylmethane (MSM PO) Take 1 tablet by mouth daily.    . Multiple Vitamin (MULTIVITAMIN WITH MINERALS) TABS tablet Take 1 tablet by mouth at bedtime.    Glory Rosebush DELICA LANCETS FINE MISC Use to test blood sugar 2 times daily as instructed. Dx: E11.59, E11.65 100 each 5   . pantoprazole (PROTONIX) 40 MG tablet Take 1 tablet by mouth twice daily 180 tablet 3  . PARoxetine (PAXIL) 20 MG tablet TAKE 2 TABLETS BY MOUTH ONCE DAILY FOR DEPRESSION 180 tablet 1  . propranolol (INDERAL) 60 MG tablet Take 1 tablet (60 mg total) by mouth 2 (two) times daily. 60 tablet 6  . rosuvastatin (CRESTOR) 10 MG tablet Take 1 tablet by mouth once daily 90 tablet 1  . Semaglutide,0.25 or 0.5MG /DOS, (OZEMPIC, 0.25 OR 0.5 MG/DOSE,) 2 MG/1.5ML SOPN Inject 0.5 mg into the skin once a week. 4.5 mL 3  . sildenafil (VIAGRA) 100 MG tablet Take 1 tablet (100 mg total) by mouth daily as needed for erectile dysfunction. 10 tablet 5  . spironolactone (ALDACTONE) 50 MG tablet Take 1 tablet by mouth once daily 90 tablet 0  . triamcinolone cream (KENALOG) 0.1 % APPLY TOPICALLY TO SCALP TWO TIMES DAILY AS NEEDED 30 g 0   No current facility-administered medications on file prior to visit.   Allergies  Allergen Reactions  . Venlafaxine     Slowed his urine stream   Family History  Problem Relation Age of Onset  . Heart failure Father        Deceased 25 y/o  . Diabetes Father   . Cancer Father        Prostate  . Heart failure Mother        Deceased 54 y/o  . Alcohol abuse Mother   . Schizophrenia Son        Paranoid  . Uveitis Son   . Cancer Brother        prostate  . Colon cancer Neg Hx   . Esophageal cancer Neg Hx   . Stomach cancer Neg Hx   . Liver cancer Neg Hx   . Pancreatic cancer Neg Hx   . Rectal cancer Neg Hx    PE: BP 120/78   Pulse 76  Ht 5\' 9"  (1.753 m)   Wt 190 lb (86.2 kg)   SpO2 96%   BMI 28.06 kg/m  Body mass index is 28.06 kg/m. Wt Readings from Last 3 Encounters:  11/03/20 190 lb (86.2 kg)  09/22/20 187 lb 3.2 oz (84.9 kg)  07/28/20 190 lb (86.2 kg)   Constitutional: overweight, in NAD Eyes: PERRLA, EOMI, no exophthalmos ENT: moist mucous membranes, no thyromegaly, no cervical lymphadenopathy Cardiovascular: RRR, No MRG, + mild pitting edema in LE  bilaterally Respiratory: CTA B Gastrointestinal: abdomen soft, NT, ND, BS+ Musculoskeletal: no deformities, strength intact in all 4 Skin: moist, warm, no rashes Neurological: + tremor with outstretched hands, DTR normal in all 4  ASSESSMENT: 1. DM2, insulin-dependent, uncontrolled, with complications - CAD - s/p 4 stents 2013 - seeing Dr Burt Knack - PN  Component     Latest Ref Rng 06/09/2016  C-Peptide     0.80-3.85 ng/mL 1.78  Glucose, Fasting     65 - 99 mg/dL 184 (H)  Glutamic Acid Decarb Ab     <5 IU/mL <5  Pancreatic Islet Cell Antibody     < 5 JDF Units <5   2. HL  3. Overweight  PLAN:  1. Patient with longstanding, uncontrolled, type 2 diabetes, with increased variability of blood sugars.  He is on a complex medication regimen with Metformin, also basal/bolus insulin regimen, and weekly GLP-1 receptor agonist, increased at last visit HbA1c at that time was higher, increased from 7.3% to 7.6%.  Sugars are higher, mostly in the 200s with occasional sugars at goal.  Upon questioning, he was taking less NPH insulin than prescribed at bedtime.  We increased this dose along with the Ozempic dose.  We did not change his Humalog doses at that time. -At this visit, sugars remain very high, most of the time, with few sugars lower than 170s and only one at 115 in the last 2 months.  Unfortunately, he admits that he is still drinking alcohol we discussed about the fact that this is influencing his blood sugars.  However, upon questioning, he also admits that he is not taking the Humalog but approximately 2-3 times a week.  Discussed about the fact that this should be taken before every meal, especially before dinner.  He will start doing so.  For now, we will keep the dose at 14 to 16 units, as he is taking it now.  We will not change the dose of the rest of his regimen for now. -We are using a lower dose of Ozempic, 0.5 mg weekly, antichrist for now.  He was previously on 1 mg weekly. - I  suggested to:  Patient Instructions  Please continue: - Metformin 1000 mg 2x a day, with meals - NPH 30 units in am and 35-40 units at bedtime - Ozempic 0.5 mg weekly  Please change: - Humalog 14-16 units 15 min before each meal  Please return in 3-4 months with your sugar log.   - we checked his HbA1c: 7.5% (stable) - advised to check sugars at different times of the day - 3-4x a day, rotating check times - advised for yearly eye exams >> he is UTD - return to clinic in 3-4 months     2. HL -Reviewed latest lipid panel from 11/2019: All fractions at goal Lab Results  Component Value Date   CHOL 112 12/11/2019   HDL 46.60 12/11/2019   LDLCALC 52 12/11/2019   LDLDIRECT 103.9 11/05/2013   TRIG 65.0 12/11/2019  CHOLHDL 2 12/11/2019  -Continues Crestor 10 without side effects  3. Overweight -We were able to restart Ozempic 01/2020, after coming off due to lack of insurance coverage.  This is still expensive for him. -We discussed again about cutting out sweets and alcohol -Weight stable since last visit.  Philemon Kingdom, MD PhD [facility]

## 2020-11-03 NOTE — Addendum Note (Signed)
Addended by: Lauralyn Primes on: 11/03/2020 11:25 AM   Modules accepted: Orders

## 2020-11-03 NOTE — Patient Instructions (Addendum)
Please continue: - Metformin 1000 mg 2x a day, with meals - NPH 30 units in am and 35-40 units at bedtime - Ozempic 0.5 mg weekly  Please change: - Humalog 14-16 units 15 min before each meal  Please return in 3-4 months with your sugar log.

## 2020-11-07 ENCOUNTER — Other Ambulatory Visit: Payer: Self-pay | Admitting: Internal Medicine

## 2020-11-11 ENCOUNTER — Other Ambulatory Visit: Payer: Self-pay

## 2020-11-11 ENCOUNTER — Ambulatory Visit (HOSPITAL_COMMUNITY)
Admission: RE | Admit: 2020-11-11 | Discharge: 2020-11-11 | Disposition: A | Discharge status: Home / Self Care | Payer: Medicare Other | Source: Ambulatory Visit | Attending: Gastroenterology | Admitting: Gastroenterology

## 2020-11-11 DIAGNOSIS — K7030 Alcoholic cirrhosis of liver without ascites: Secondary | ICD-10-CM | POA: Diagnosis not present

## 2020-11-13 ENCOUNTER — Other Ambulatory Visit: Payer: Self-pay | Admitting: Orthopaedic Surgery

## 2020-11-13 ENCOUNTER — Encounter: Payer: Self-pay | Admitting: Orthopaedic Surgery

## 2020-11-13 MED ORDER — HYDROCODONE-ACETAMINOPHEN 5-325 MG PO TABS: 1.0000 | ORAL_TABLET | Freq: Four times a day (QID) | ORAL | 0 refills | Status: DC | PRN

## 2020-11-15 ENCOUNTER — Ambulatory Visit
Admission: RE | Admit: 2020-11-15 | Discharge: 2020-11-15 | Disposition: A | Discharge status: Home / Self Care | Payer: Medicare Other | Source: Ambulatory Visit | Attending: Physician Assistant | Admitting: Physician Assistant

## 2020-11-15 ENCOUNTER — Other Ambulatory Visit: Payer: Self-pay | Admitting: Gastroenterology

## 2020-11-15 ENCOUNTER — Other Ambulatory Visit: Payer: Self-pay

## 2020-11-15 DIAGNOSIS — M48061 Spinal stenosis, lumbar region without neurogenic claudication: Secondary | ICD-10-CM | POA: Diagnosis not present

## 2020-11-15 DIAGNOSIS — G8929 Other chronic pain: Secondary | ICD-10-CM

## 2020-11-15 DIAGNOSIS — M79604 Pain in right leg: Secondary | ICD-10-CM

## 2020-11-15 DIAGNOSIS — M545 Low back pain, unspecified: Secondary | ICD-10-CM | POA: Diagnosis not present

## 2020-11-17 DIAGNOSIS — H35033 Hypertensive retinopathy, bilateral: Secondary | ICD-10-CM | POA: Diagnosis not present

## 2020-11-17 DIAGNOSIS — H3562 Retinal hemorrhage, left eye: Secondary | ICD-10-CM | POA: Diagnosis not present

## 2020-11-17 DIAGNOSIS — H348122 Central retinal vein occlusion, left eye, stable: Secondary | ICD-10-CM | POA: Diagnosis not present

## 2020-11-17 DIAGNOSIS — E113291 Type 2 diabetes mellitus with mild nonproliferative diabetic retinopathy without macular edema, right eye: Secondary | ICD-10-CM | POA: Diagnosis not present

## 2020-11-18 ENCOUNTER — Other Ambulatory Visit: Payer: Medicare Other

## 2020-11-19 ENCOUNTER — Telehealth: Payer: Self-pay

## 2020-11-19 NOTE — Chronic Care Management (AMB) (Addendum)
Chronic Care Management Pharmacy Assistant   Name: David [name]  MRN: [id number] DOB: [birth year]  Reason for Encounter: Disease State  Patient Questions:  1.  Have you seen any other providers since your last visit? Yes 11/24/20- Dr. Jean Rosenthal- Orthopaedics 11/03/20- Dr. Ardeth Sportsman- Internal Medicine 10/28/20 Dr. Jean Rosenthal- Orthopedics 09/23/11 Dr. Sherren Mocha- Cardiology 09/08/20 Dr. Ila Mcgill- Podiatry 07/28/20 Dr. Ardeth Sportsman Internal medicine 06/23/20 Dr. Owens Loffler- Gastroenterology 06/10/20 Dr. Wilhemena Durie - PCP   2.  Any changes in your medicines or health? yes 11/03/20 Dr. Renne Crigler increased insulin Lispro from 8-12 units before meals to 14-16 units before meals. 10/28/20 Dr. Ninfa Linden added hydrocodone 5/325. 08/04/20 patient discontinued use of alfuzosin due to it making him "sluggish". 07/28/20 Dr. Renne Crigler increased Insulin NPH 100 units from 30 units in AM and 25-30 units at bedtime to 30 units in the AM and 35-40 units at bedtime. 06/23/20 Dr. Ardis Hughs increased propranolol from 40 mg twice daily to 60 mg twice daily. 06/10/20 Dr. Silvio Pate started patient on Alfuzsin 10 mg.     PCP : [name], MD  Allergies:   Allergies  Allergen Reactions   Venlafaxine     Slowed his urine stream    Medications: Outpatient Encounter Medications as of 11/19/2020  Medication Sig   ALPHA LIPOIC ACID PO Take 1 tablet by mouth daily.   Ascorbic Acid (VITAMIN C) 1000 MG tablet Take 1,000 mg by mouth daily.   b complex vitamins tablet Take 1 tablet by mouth daily.   Cholecalciferol (D3 VITAMIN PO) Take 1 tablet by mouth daily.   Coenzyme Q10 (CO Q 10) 100 MG CAPS Take 1 capsule by mouth daily.   ferrous sulfate 325 (65 FE) MG tablet Take 325 mg by mouth daily with breakfast.   furosemide (LASIX) 20 MG tablet Take 1 tablet by mouth once daily   glucose blood (ONE TOUCH ULTRA TEST) test strip Use to test blood sugar 2 times daily as instructed. Dx: E11.59, E11.65    HYDROcodone-acetaminophen (NORCO) 5-325 MG tablet Take 1 tablet by mouth every 6 (six) hours as needed for moderate pain.   insulin lispro (HUMALOG KWIKPEN) 200 UNIT/ML KwikPen INJECT 14-16 UNITS INTO THE SKIN THREE TIMES DAILY BEFORE MEALS   insulin NPH Human (NOVOLIN N RELION) 100 UNIT/ML injection Inject under skin 30 units in a.m. and 35-40 units at bedtime   Insulin Pen Needle 32G X 4 MM MISC Use 4x a day - with insulin   Insulin Syringe-Needle U-100 (RELION INSULIN SYRINGE) 31G X 15/64" 0.5 ML MISC Inject twice a day   losartan (COZAAR) 50 MG tablet TAKE 1 & 1/2 (ONE & ONE-HALF) TABLETS BY MOUTH ONCE DAILY   metFORMIN (GLUCOPHAGE) 1000 MG tablet Take 1 tablet (1,000 mg total) by mouth 2 (two) times daily with a meal.   Methylsulfonylmethane (MSM PO) Take 1 tablet by mouth daily.   Multiple Vitamin (MULTIVITAMIN WITH MINERALS) TABS tablet Take 1 tablet by mouth at bedtime.   ONETOUCH DELICA LANCETS FINE MISC Use to test blood sugar 2 times daily as instructed. Dx: E11.59, E11.65   pantoprazole (PROTONIX) 40 MG tablet Take 1 tablet by mouth twice daily   PARoxetine (PAXIL) 20 MG tablet TAKE 2 TABLETS BY MOUTH ONCE DAILY FOR DEPRESSION   propranolol (INDERAL) 60 MG tablet Take 1 tablet (60 mg total) by mouth 2 (two) times daily.   rosuvastatin (CRESTOR) 10 MG tablet Take 1 tablet by mouth once daily   Semaglutide,0.25 or  0.5MG /DOS, (OZEMPIC, 0.25 OR 0.5 MG/DOSE,) 2 MG/1.5ML SOPN Inject 0.5 mg into the skin once a week.   sildenafil (VIAGRA) 100 MG tablet Take 1 tablet (100 mg total) by mouth daily as needed for erectile dysfunction.   spironolactone (ALDACTONE) 50 MG tablet Take 1 tablet by mouth once daily   triamcinolone cream (KENALOG) 0.1 % APPLY TOPICALLY TO SCALP TWO TIMES DAILY AS NEEDED   No facility-administered encounter medications on file as of 11/19/2020.    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[place]) 12/11/2019   Hip pain 08/07/2019   History of esophageal varices 0000000   Alcoholic cirrhosis of liver with ascites ([place])    Secondary esophageal varices without bleeding ([place])    Anemia 08/31/2018   Overweight 09/13/2017   Poorly controlled type 2 diabetes mellitus with circulatory disorder ([place]) 01/18/2017   Medial epicondylitis of elbow, left 08/03/2016   Elevated liver function tests 07/13/2016   Thrombocytopenia ([place]) 07/13/2016   Trigger middle finger of left hand 03/02/2016   Bilateral hand pain 12/02/2015   Primary osteoarthritis of both first carpometacarpal joints 12/02/2015   Advance directive discussed with patient 05/16/2015   Routine general medical examination at a health care facility 02/16/2011   Erectile dysfunction 02/16/2011   Essential hypertension 12/21/2010   Hyperlipidemia LDL goal <70 06/24/2009   Osteoarthritis, multiple sites 04/16/2009   CAD (coronary artery disease) 02/04/2009   Major depressive disorder, recurrent episode ([place]) 08/21/2008   ALLERGIC RHINITIS 08/21/2008   Sleep apnea 08/21/2008   Recent Relevant Labs: Lab Results  Component Value Date/Time   HGBA1C 7.5 (A) 11/03/2020 11:24 AM   HGBA1C 7.5 (A) 07/28/2020 11:00 AM   HGBA1C 8.9 (H) 12/11/2019 11:05 AM   HGBA1C 7.0 (H) 08/28/2018 06:45 PM   MICROALBUR 9.1 (H) 02/11/2010 10:11 AM   MICROALBUR 1.8 04/16/2009 09:52 AM    Kidney Function Lab Results  Component Value Date/Time   CREATININE 1.20 06/23/2020 02:50 PM   CREATININE 1.19 06/10/2020 10:58 AM   CREATININE 0.99 09/13/2017 10:40 AM   CREATININE 0.97 06/09/2016 08:36 AM   GFR 59.64 (L) 06/23/2020 02:50 PM   GFRNONAA >60 08/31/2018 06:26 AM   GFRNONAA 78 09/13/2017 10:40 AM   GFRAA >60 08/31/2018 06:26 AM   GFRAA 90 09/13/2017 10:40 AM   Current antihyperglycemic regimen:  Metformin 1000 mg - 1 tablet BID with meals Insulin NPH - Inject 30 units in am and 35-40 at night   Humalog - Inject 14-16 units 15 min before a meal (usually takes 14-16, if heavy meal will take 20 units)   Ozepmic- 0.5 mg weekly   What recent interventions/DTPs have been made to improve glycemic control:  Increased Humalog  Have there been any recent hospitalizations or ED visits since last visit with CPP? No   Patient denies hypoglycemic symptoms, including Pale, Sweaty, Shaky, Hungry, Nervous/irritable and Vision changes   Patient denies hyperglycemic symptoms, including blurry vision, excessive thirst, fatigue, polyuria and weakness   How often are you checking your blood sugar? once daily   What are your blood sugars ranging?  Fasting:  11/24/20- 220 11/22/20- 246  Before meals:  11/23/20- 194  After meals: N/A Bedtime: N/A  During the week, how often does your blood glucose drop below 70? Never   Are you checking your feet daily/regularly? Yes, denies any blisters, sores, wounds or redness.  Adherence Review: Is the  patient currently on a STATIN medication? Yes Is the patient currently on ACE/ARB medication? Yes Does the patient have >5 day gap between last estimated fill dates? No  Reviewed chart prior to disease state call. Spoke with patient regarding BP  Recent Office Vitals: BP Readings from Last 3 Encounters:  11/03/20 120/78  09/22/20 100/74  07/28/20 140/70   Pulse Readings from Last 3 Encounters:  11/03/20 76  09/22/20 71  07/28/20 74    Wt Readings from Last 3 Encounters:  11/03/20 190 lb (86.2 kg)  09/22/20 187 lb 3.2 oz (84.9 kg)  07/28/20 190 lb (86.2 kg)     Kidney Function Lab Results  Component Value Date/Time   CREATININE 1.20 06/23/2020 02:50 PM   CREATININE 1.19 06/10/2020 10:58 AM   CREATININE 0.99 09/13/2017 10:40 AM   CREATININE 0.97 06/09/2016 08:36 AM   GFR 59.64 (L) 06/23/2020 02:50 PM   GFRNONAA >60 08/31/2018 06:26 AM   GFRNONAA 78 09/13/2017 10:40 AM   GFRAA >60 08/31/2018 06:26 AM   GFRAA 90 09/13/2017 10:40 AM     BMP Latest Ref Rng & Units 06/23/2020 06/10/2020 02/25/2020  Glucose 70 - 99 mg/dL 162(H) 219(H) 230(H)  BUN 6 - 23 mg/dL 25(H) 34(H) 22  Creatinine 0.40 - 1.50 mg/dL 1.20 1.19 1.22  BUN/Creat Ratio 6 - 22 (calc) - - -  Sodium 135 - 145 mEq/L 137 134(L) 134(L)  Potassium 3.5 - 5.1 mEq/L 4.8 5.2(H) 5.0  Chloride 96 - 112 mEq/L 108 104 102  CO2 19 - 32 mEq/L 23 23 25   Calcium 8.4 - 10.5 mg/dL 10.4 10.0 10.1    Current antihypertensive regimen:  Losartan 50 mg - 1 and 1/2 tablet daily Furosemide 20 mg - 1 tablet daily (per GI for swelling) Spironolactone 50 mg - 1 tablet daily  (per GI for swelling)  How often are you checking your Blood Pressure? infrequently, about once a month.   Current home BP readings: 130/70- states he is not certain when that was but was done in the last month.  What recent interventions/DTPs have been made by any provider to improve Blood Pressure control since last CPP Visit: No recent interventions or DTP's  Any recent hospitalizations or ED visits since last visit with CPP? No   What diet changes have been made to improve Blood Pressure Control?  No diet changes. Drinks protein shake for breakfast. Usually skips lunch.  What exercise is being done to improve your Blood Pressure Control?  Activity level minimal.   Adherence Review: Is the patient currently on ACE/ARB medication? Yes Does the patient have >5 day gap between last estimated fill dates? No    Lipid Panel    Component Value Date/Time   CHOL 112 12/11/2019 1105   TRIG 65.0 12/11/2019 1105   HDL 46.60 12/11/2019 1105   LDLCALC 52 12/11/2019 1105   LDLDIRECT 103.9 11/05/2013 1249    10-year ASCVD risk score: The ASCVD Risk score Mikey Bussing DC Jr., et al., 2013) failed to calculate for the following reasons:   The valid total cholesterol range is 130 to 320 mg/dL  Current antihyperlipidemic regimen:  Rosuvastatin 10 mg - 1 tablet daily  Previous antihyperlipidemic medications tried:  None  ASCVD risk enhancing conditions: age >55, DM and HTN   What recent interventions/DTPs have been made by any provider to improve Cholesterol control since last CPP Visit: No recent changes. Cardiologist feels lipids are well controlled.   Any recent hospitalizations or ED visits since last visit  with CPP? No   What diet changes have been made to improve Cholesterol?  No diet changes  What exercise is being done to improve Cholesterol?  No formal exercise. Activity minimal.  Adherence Review: Does the patient have >5 day gap between last estimated fill dates? No   Follow-Up:  Pharmacist Review   Debbora Dus, CPP notified  Margaretmary Dys, [place] Assistant [phone]  I have reviewed the care management and care coordination activities outlined in this encounter and I am certifying that I agree with the content of this note. HTN and HLD well controlled. DM remains uncontrolled. Per last endocrinolgy visit pt had increased alcohol intake and was only taking Humalog several days per week. I am scheduling CCM visit for this month to review diabetes medication adherence. Pt was lost to follow up June 2021.   Debbora Dus, PharmD Clinical Pharmacist [facility] [phone]

## 2020-11-24 ENCOUNTER — Other Ambulatory Visit: Payer: Self-pay | Admitting: Internal Medicine

## 2020-11-24 ENCOUNTER — Encounter: Payer: Self-pay | Admitting: Orthopaedic Surgery

## 2020-11-24 ENCOUNTER — Ambulatory Visit (INDEPENDENT_AMBULATORY_CARE_PROVIDER_SITE_OTHER): Payer: Medicare Other | Admitting: Orthopaedic Surgery

## 2020-11-24 ENCOUNTER — Other Ambulatory Visit: Payer: Self-pay

## 2020-11-24 DIAGNOSIS — M4726 Other spondylosis with radiculopathy, lumbar region: Secondary | ICD-10-CM

## 2020-11-24 DIAGNOSIS — M79605 Pain in left leg: Secondary | ICD-10-CM

## 2020-11-24 DIAGNOSIS — M5441 Lumbago with sciatica, right side: Secondary | ICD-10-CM

## 2020-11-24 DIAGNOSIS — M5442 Lumbago with sciatica, left side: Secondary | ICD-10-CM

## 2020-11-24 DIAGNOSIS — M48061 Spinal stenosis, lumbar region without neurogenic claudication: Secondary | ICD-10-CM

## 2020-11-24 DIAGNOSIS — G8929 Other chronic pain: Secondary | ICD-10-CM

## 2020-11-24 DIAGNOSIS — M79604 Pain in right leg: Secondary | ICD-10-CM

## 2020-11-24 NOTE — Progress Notes (Signed)
The patient comes in today to go over an MRI of his lumbar spine.  We felt this was appropriate this standpoint given the failed conservative treatment and due to his continued bilateral thigh pain.  His hip and knee exams have been normal.  Physical therapy has not helped him as much as we go.  He still denies any change in bowel bladder function.  He does have peripheral neuropathy but does feel like he is getting weaker in his quads.  The MRI of his lumbar spine shows significant stenosis at multiple levels there is multiple multifactorial from L2 all the way down to S1.  He has advanced thecal sac compression throughout the lumbar spine starting at L2-L3.  There is moderate left foraminal stenosis and advanced left foraminal stenosis at L2-L3 which is moderate and L3-L4 which is advanced.  At L4-L5 is more advanced on the right.  It is moderate at L5-S1 to the right.  At this standpoint I would like to send him to Dr. Alvester Morin for considering epidural steroid injections bilaterally at L2-L3 versus L3-L4 given the patient's anterior thigh pain and weakness bilaterally.  He agrees with this recommendation prior to potentially making referral to a spine surgeon.  I gave him a copy of his MRI report.  All question concerns were answered and addressed.

## 2020-11-24 NOTE — Addendum Note (Signed)
Addended by: Elvin So L on: 11/24/2020 11:20 AM   Modules accepted: Orders

## 2020-11-25 ENCOUNTER — Ambulatory Visit: Payer: Medicare Other | Admitting: Orthopaedic Surgery

## 2020-12-01 ENCOUNTER — Other Ambulatory Visit: Payer: Self-pay | Admitting: Physician Assistant

## 2020-12-01 ENCOUNTER — Telehealth: Payer: Self-pay

## 2020-12-01 ENCOUNTER — Telehealth: Payer: Medicare Other

## 2020-12-01 MED ORDER — HYDROCODONE-ACETAMINOPHEN 5-325 MG PO TABS: 1.0000 | ORAL_TABLET | Freq: Four times a day (QID) | ORAL | 0 refills | Status: DC | PRN

## 2020-12-01 NOTE — Chronic Care Management (AMB) (Deleted)
Chronic Care Management Pharmacy  Name: David [name]  MRN: [id number] DOB: 06/22/1951   Chief Complaint/ HPI  David [name],  70 y.o., male presents for their Initial CCM visit with the clinical pharmacist via telephone.  PCP : [name], MD   Their chronic conditions include: hypertension, hyperlipidemia, depressive disorder, CAD, allergic rhinitis, sleep apnea, type 2 diabetes, cirrhosis, osteoarthritis, erectile dysfunction, anemia, esophageal varices   Patient concerns: denies medication concerns  Office Visits:  12/11/19: [name] - reduce added salt  Consult Visit:  11/03/20: [name] - patient continues alcohol use, taking Humalog 2-3 days per week. Continue dose at 14-16 units before meals. RTC 3-4 months.  01/08/20: Gastroenterology/cirrhosis - start aldactone 50 mg daily, lasix 20 mg daily, start hepA/B series  12/18/19: [name] - continue current medications, resume Humalog  Allergies  Allergen Reactions  . Venlafaxine     Slowed his urine stream   Medications: Outpatient Encounter Medications as of 12/01/2020  Medication Sig  . ALPHA LIPOIC ACID PO Take 1 tablet by mouth daily.  . Ascorbic Acid (VITAMIN C) 1000 MG tablet Take 1,000 mg by mouth daily.  [name] b complex vitamins tablet Take 1 tablet by mouth daily.  . Cholecalciferol (D3 VITAMIN PO) Take 1 tablet by mouth daily.  . Coenzyme Q10 (CO Q 10) 100 MG CAPS Take 1 capsule by mouth daily.  . ferrous sulfate 325 (65 FE) MG tablet Take 325 mg by mouth daily with breakfast.  . furosemide (LASIX) 20 MG tablet Take 1 tablet by mouth once daily  . glucose blood (ONE TOUCH ULTRA TEST) test strip Use to test blood sugar 2 times daily as instructed. Dx: E11.59, E11.65  . HYDROcodone-acetaminophen (NORCO) 5-325 MG tablet Take 1 tablet by mouth every 6 (six) hours as needed for moderate pain.  [name] insulin lispro (HUMALOG KWIKPEN) 200 UNIT/ML KwikPen INJECT 14-16 UNITS INTO THE SKIN THREE TIMES DAILY BEFORE MEALS  . insulin  NPH Human (NOVOLIN N RELION) 100 UNIT/ML injection Inject under skin 30 units in a.m. and 35-40 units at bedtime  . Insulin Pen Needle 32G X 4 MM MISC Use 4x a day - with insulin  . Insulin Syringe-Needle U-100 (RELION INSULIN SYRINGE) 31G X 15/64" 0.5 ML MISC USE  TWICE DAILY  . losartan (COZAAR) 50 MG tablet TAKE 1 & 1/2 (ONE & ONE-HALF) TABLETS BY MOUTH ONCE DAILY  . metFORMIN (GLUCOPHAGE) 1000 MG tablet Take 1 tablet (1,000 mg total) by mouth 2 (two) times daily with a meal.  . Methylsulfonylmethane (MSM PO) Take 1 tablet by mouth daily.  . Multiple Vitamin (MULTIVITAMIN WITH MINERALS) TABS tablet Take 1 tablet by mouth at bedtime.  [name] DELICA LANCETS FINE MISC Use to test blood sugar 2 times daily as instructed. Dx: E11.59, E11.65  . pantoprazole (PROTONIX) 40 MG tablet Take 1 tablet by mouth twice daily  . PARoxetine (PAXIL) 20 MG tablet TAKE 2 TABLETS BY MOUTH ONCE DAILY FOR DEPRESSION  . propranolol (INDERAL) 60 MG tablet Take 1 tablet (60 mg total) by mouth 2 (two) times daily.  . rosuvastatin (CRESTOR) 10 MG tablet Take 1 tablet by mouth once daily  . Semaglutide,0.25 or 0.5MG /DOS, (OZEMPIC, 0.25 OR 0.5 MG/DOSE,) 2 MG/1.5ML SOPN Inject 0.5 mg into the skin once a week.  . sildenafil (VIAGRA) 100 MG tablet Take 1 tablet (100 mg total) by mouth daily as needed for erectile dysfunction.  [name] spironolactone (ALDACTONE) 50 MG tablet Take 1 tablet by mouth once daily  . triamcinolone cream (  KENALOG) 0.1 % APPLY TOPICALLY TO SCALP TWO TIMES DAILY AS NEEDED   No facility-administered encounter medications on file as of 12/01/2020.   Current Diagnosis/Assessment: Goals    . Pharmacy Care Plan     CARE PLAN ENTRY  Current Barriers:  . Chronic Disease Management support, education, and care coordination needs related to hypertension, hyperlipidemia, depressive disorder, CAD, allergic rhinitis, sleep apnea, type 2 diabetes, cirrhosis, osteoarthritis, erectile dysfunction, anemia, esophageal  varice  Pharmacist Clinical Goal(s):  [name] Improve diabetes control with goal of A1c less than 7%, fasting blood sugars 80-130 and 1-2 hours after meals less than 180. Continue to make healthy dietary choices. Choose frozen meals with at least 15 grams of protein and avoid breaded meats, rice, and pasta based-options. Look for meals with the highest fiber and lowest sodium. Recommend setting phone alarm to help remember taking Humalog before meals.  . Reduce medication cost burden. Call [phone] or visit [email or web address]/ozempic/let-us-help/pap.html for information regarding patient assistance. Please call if you would like help with the application process.  . Remain up to date on vaccinations. Recommend shingles 2-dose series (Shingrix) from your pharmacy.  . Maintain blood pressure less than 140/90 mmHg. Recommend checking blood pressure once weekly. If elevated, please call.   Interventions: . Comprehensive medication review performed  Patient Self Care Activities:  . Self administers medications as prescribed . Self-monitors blood glucose  Initial goal documentation      Diabetes   Recent Relevant Labs: Lab Results  Component Value Date/Time   HGBA1C 7.5 (A) 11/03/2020 11:24 AM   HGBA1C 7.5 (A) 07/28/2020 11:00 AM   HGBA1C 8.9 (H) 12/11/2019 11:05 AM   HGBA1C 7.0 (H) 08/28/2018 06:45 PM   MICROALBUR 9.1 (H) 02/11/2010 10:11 AM   MICROALBUR 1.8 04/16/2009 09:52 AM    Checking BG: 2x per Day (before breakfast, before dinner)  Recent FBG Readings: low 200s throughout the day/very similar to last visit with endocrinologist per patient, did not have log with him  Patient has tried these meds in past: Ozempic (cost) 03/20-10/20 Patient is currently uncontrolled on the following medications:  Metformin 1000 mg - 1 tablet BID with meals  Insulin NPH - Inject 30 units in am and 35-40 at night (usually takes 35)   Humalog - Inject 14-20 units 15 min before a meal (usually take  14-16, if heavy meal will take 20 units)  Last diabetic eye exam:  Lab Results  Component Value Date/Time   HMDIABEYEEXA No Retinopathy 12/24/2019 12:00 AM    Last diabetic foot exam:  Lab Results  Component Value Date/Time   HMDIABFOOTEX done 11/27/2018 12:00 AM    We discussed: reports he plans to start back on Ozempic this Sunday, was able to get 30 day supply for $38 --> educated on patient assistance and pt plans to look into it; reports he occasionally forgets Humalog before meals, will take up to 30 minutes after meal if he remembers in time  Diet: reports not doing as well as he should, eating a lot of processed foods, canned foods, microwave frozen meals, as well as continuing 4-5 beers nightly   Plan: Continue current medications; Follow up with endocrinology. Plans to restart Ozempic this week. Continue to work on making healthy dietary choices. Recommend setting phone alarm to remind of Humalog before meals. Referred to patient assistance website.   Hypertension   CMP Latest Ref Rng & Units 06/23/2020 06/10/2020 02/25/2020  Glucose 70 - 99 mg/dL 162(H) 219(H) 230(H)  BUN 6 - 23  mg/dL 25(H) 34(H) 22  Creatinine 0.40 - 1.50 mg/dL 1.20 1.19 1.22  Sodium 135 - 145 mEq/L 137 134(L) 134(L)  Potassium 3.5 - 5.1 mEq/L 4.8 5.2(H) 5.0  Chloride 96 - 112 mEq/L 108 104 102  CO2 19 - 32 mEq/L 23 23 25   Calcium 8.4 - 10.5 mg/dL 10.4 10.0 10.1  Total Protein 6.0 - 8.3 g/dL 7.9 - 8.1  Total Bilirubin 0.2 - 1.2 mg/dL 0.6 - 0.9  Alkaline Phos 39 - 117 U/L 84 - 102  AST 0 - 37 U/L 47(H) - 57(H)  ALT 0 - 53 U/L 44 - 44   Office blood pressures are: BP Readings from Last 3 Encounters:  11/03/20 120/78  09/22/20 100/74  07/28/20 140/70   Patient has failed these meds in the past: none Patient checks BP at home: infrequently, checks at grocery store Patient home BP readings are ranging: none reported, but states usually good   Patient is currently controlled on the following  medications:   Losartan 50 mg - 1 and 1/2 tablet daily  Furosemide 20 mg - 1 tablet daily (per GI for swelling)  Spironolactone 50 mg - 1 tablet daily  (per GI for swelling)  Plan: Continue current medications; Recommend checking blood pressure once weekly.   Hyperlipidemia   Lipid Panel     Component Value Date/Time   CHOL 112 12/11/2019 1105   TRIG 65.0 12/11/2019 1105   HDL 46.60 12/11/2019 1105   CHOLHDL 2 12/11/2019 1105   VLDL 13.0 12/11/2019 1105   LDLCALC 52 12/11/2019 1105   LDLDIRECT 103.9 11/05/2013 1249     LDL goal < 70 (CAD) Patient has failed these meds in past: none Patient is currently controlled on the following medications:   Rosuvastatin 10 mg - 1 tablet daily  We discussed: confirms adherence, denies adverse effects  Plan: Continue current medications  Cirrhosis of liver   Followed by Owens Loffler Patient has tried these meds in past: Nadolol (unknown) Patient is currently controlled on the following medications:   Spironolactone 50 mg - 1 tablet daily  Propranolol 20 mg - 1 tablet BID  Furosemide 20 mg - 1 tablet daily  We discussed: kidney function has declined since starting diuretics, denies any swelling; reports follow up soon with GI  Plan: Continue current medications   Depression   Patient has failed these meds in past: none  Patient is currently controlled on the following medications:   Paroxetine 20 mg - 2 tablets daily (AM)  We discussed: working well  Plan: Continue current medications   Erectile Dysfunction   Patient has failed these meds in past: none reported Patient is currently controlled on the following medications:   Sildenafil 100 mg - 1 tablet PRN  We discussed: confirms still taking, sometimes doesn't work as well as others, takes on empty stomach, takes 1 tablet as needed  Plan: Continue current medications  Vaccines   Reviewed and discussed patient's vaccination history.    Immunization History   Administered Date(s) Administered  . Fluad Quad(high Dose 65+) 08/07/2019, 10/02/2020  . Hep A / Hep B 04/02/2020, 04/09/2020, 05/01/2020  . Influenza Split 08/30/2011, 09/18/2012  . Influenza Whole 08/21/2008, 10/02/2009, 08/18/2010  . Influenza, High Dose Seasonal PF 08/29/2018  . Influenza,inj,Quad PF,6+ Mos 11/05/2013, 09/12/2015, 08/27/2016, 09/02/2017  . PFIZER SARS-COV-2 Vaccination 01/28/2020, 02/28/2020  . Pneumococcal Conjugate-13 10/09/2014  . Pneumococcal Polysaccharide-23 02/11/2010, 01/18/2017  . Td 11/29/2002  . Tdap 11/05/2013   Plan: Recommend patient receive Shingrix 2-dose  series.    Medication Management  Misc ( no diagnosis): pantoprazole 40 mg - BID  OTCs: OTC iron 324 mg daily - denies constipation, vitamin C 1000 mg, alpha lipoic acid - antioxidant, b complex, vitamin D3, chromium - blood sugar, coq10 100 mg, MSM - joints, triamcinolone cream - PRN psoriasis, multivitamin daily  Pharmacy/Benefits: [employer]/[facility]   Adherence: some difficulty remembering Humalog dose with meals  Affordability: may be interested in PAP for Ozempic  CCM Follow Up: 05/22/20 at 11:00 AM (telephone)  Debbora Dus, PharmD Clinical Pharmacist [facility] [phone]

## 2020-12-01 NOTE — Telephone Encounter (Signed)
  Chronic Care Management   Outreach Note  12/01/2020 Name: Bonham Zingale MRN: [id number] DOB: 07-03-1951  Referred by: [name], MD Reason for referral : Chronic Care Management  Attempted to reach patient by telephone to discuss high blood glucose readings per CMA assessment 11/19/20. Left voicemail with contact information.  Follow Up Plan: CMA will call for rescheduling   Phil Dopp, PharmD Clinical Pharmacist [facility] at [facility] [phone]

## 2020-12-07 ENCOUNTER — Other Ambulatory Visit: Payer: Self-pay | Admitting: Internal Medicine

## 2020-12-16 ENCOUNTER — Ambulatory Visit: Payer: Medicare Other | Admitting: Internal Medicine

## 2020-12-19 ENCOUNTER — Telehealth: Payer: Self-pay

## 2020-12-19 NOTE — Chronic Care Management (AMB) (Addendum)
Chronic Care Management Pharmacy Assistant   Name: David [name]  MRN: [id number] DOB: [birth year]  Reason for Encounter: Disease State- Diabetes  Patient Questions:  1.  Have you seen any other providers since your last visit? Yes 11/24/20- Dr. Jean Rosenthal- Orthopedics   2.  Any changes in your medicines or health? No  PCP : [name], MD  Allergies:   Allergies  Allergen Reactions   Venlafaxine     Slowed his urine stream    Medications: Outpatient Encounter Medications as of 12/19/2020  Medication Sig   ALPHA LIPOIC ACID PO Take 1 tablet by mouth daily.   Ascorbic Acid (VITAMIN C) 1000 MG tablet Take 1,000 mg by mouth daily.   b complex vitamins tablet Take 1 tablet by mouth daily.   Cholecalciferol (D3 VITAMIN PO) Take 1 tablet by mouth daily.   Coenzyme Q10 (CO Q 10) 100 MG CAPS Take 1 capsule by mouth daily.   ferrous sulfate 325 (65 FE) MG tablet Take 325 mg by mouth daily with breakfast.   furosemide (LASIX) 20 MG tablet Take 1 tablet by mouth once daily   glucose blood (ONE TOUCH ULTRA TEST) test strip Use to test blood sugar 2 times daily as instructed. Dx: E11.59, E11.65   HYDROcodone-acetaminophen (NORCO) 5-325 MG tablet Take 1 tablet by mouth every 6 (six) hours as needed for moderate pain.   insulin lispro (HUMALOG KWIKPEN) 200 UNIT/ML KwikPen INJECT 14-16 UNITS INTO THE SKIN THREE TIMES DAILY BEFORE MEALS   insulin NPH Human (NOVOLIN N RELION) 100 UNIT/ML injection Inject under skin 30 units in a.m. and 35-40 units at bedtime   Insulin Pen Needle 32G X 4 MM MISC Use 4x a day - with insulin   Insulin Syringe-Needle U-100 (RELION INSULIN SYRINGE) 31G X 15/64" 0.5 ML MISC USE  TWICE DAILY   losartan (COZAAR) 50 MG tablet TAKE 1 & 1/2 (ONE & ONE-HALF) TABLETS BY MOUTH ONCE DAILY   metFORMIN (GLUCOPHAGE) 1000 MG tablet TAKE 1 TABLET BY MOUTH TWICE DAILY WITH MEALS   Methylsulfonylmethane (MSM PO) Take 1 tablet by mouth daily.   Multiple Vitamin  (MULTIVITAMIN WITH MINERALS) TABS tablet Take 1 tablet by mouth at bedtime.   ONETOUCH DELICA LANCETS FINE MISC Use to test blood sugar 2 times daily as instructed. Dx: E11.59, E11.65   pantoprazole (PROTONIX) 40 MG tablet Take 1 tablet by mouth twice daily   PARoxetine (PAXIL) 20 MG tablet TAKE 2 TABLETS BY MOUTH ONCE DAILY FOR DEPRESSION   propranolol (INDERAL) 60 MG tablet Take 1 tablet (60 mg total) by mouth 2 (two) times daily.   rosuvastatin (CRESTOR) 10 MG tablet Take 1 tablet by mouth once daily   Semaglutide,0.25 or 0.5MG /DOS, (OZEMPIC, 0.25 OR 0.5 MG/DOSE,) 2 MG/1.5ML SOPN Inject 0.5 mg into the skin once a week.   sildenafil (VIAGRA) 100 MG tablet Take 1 tablet (100 mg total) by mouth daily as needed for erectile dysfunction.   spironolactone (ALDACTONE) 50 MG tablet Take 1 tablet by mouth once daily   triamcinolone cream (KENALOG) 0.1 % APPLY TOPICALLY TO SCALP TWO TIMES DAILY AS NEEDED   No facility-administered encounter medications on file as of 12/19/2020.    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[place]) 12/11/2019   Hip pain 08/07/2019   History of esophageal varices 29/52/8413   Alcoholic cirrhosis of liver with ascites ([place])  Secondary esophageal varices without bleeding ([place])    Anemia 08/31/2018   Overweight 09/13/2017   Poorly controlled type 2 diabetes mellitus with circulatory disorder ([place]) 01/18/2017   Medial epicondylitis of elbow, left 08/03/2016   Elevated liver function tests 07/13/2016   Thrombocytopenia ([place]) 07/13/2016   Trigger middle finger of left hand 03/02/2016   Bilateral hand pain 12/02/2015   Primary osteoarthritis of both first carpometacarpal joints 12/02/2015   Advance directive discussed with patient 05/16/2015   Routine general medical examination at a health care facility 02/16/2011   Erectile dysfunction 02/16/2011    Essential hypertension 12/21/2010   Hyperlipidemia LDL goal <70 06/24/2009   Osteoarthritis, multiple sites 04/16/2009   CAD (coronary artery disease) 02/04/2009   Major depressive disorder, recurrent episode ([place]) 08/21/2008   ALLERGIC RHINITIS 08/21/2008   Sleep apnea 08/21/2008    Contacted patient to review any updated blood sugar readings and inquire about setting up a follow up appointment with Debbora Dus, Pharm. D. Patient did not have any readings available at the time but was willing to set up a follow up appointment. Appointment scheduled 01/28/21 at 1:00.   Are you having any problems with your medications? Patient denies any problems with medications  What concerns would like to discuss with the pharmacist? No concerns at this time  Patient reminded to have all medications, supplements and any blood sugar and blood pressure readings available for review with Debbora Dus, Pharm. D, at their telephone visit on 01/28/21.    Follow-Up:  Pharmacist Review and Scheduled Follow-Up With Clinical Pharmacist   Debbora Dus, CPP notified  Margaretmary Dys, [place] Pharmacy Assistant [phone]

## 2020-12-23 ENCOUNTER — Other Ambulatory Visit: Payer: Self-pay

## 2020-12-23 ENCOUNTER — Ambulatory Visit (INDEPENDENT_AMBULATORY_CARE_PROVIDER_SITE_OTHER): Payer: Medicare Other | Admitting: Physical Medicine and Rehabilitation

## 2020-12-23 ENCOUNTER — Encounter: Payer: Self-pay | Admitting: Physical Medicine and Rehabilitation

## 2020-12-23 ENCOUNTER — Ambulatory Visit: Payer: Self-pay

## 2020-12-23 VITALS — BP 126/76 | HR 71

## 2020-12-23 DIAGNOSIS — M5416 Radiculopathy, lumbar region: Secondary | ICD-10-CM

## 2020-12-23 MED ORDER — DEXAMETHASONE SODIUM PHOSPHATE 10 MG/ML IJ SOLN
15.0000 mg | Freq: Once | INTRAMUSCULAR | Status: AC
Start: 2020-12-23 — End: 2020-12-23
  Administered 2020-12-23: 15 mg

## 2020-12-23 NOTE — Patient Instructions (Signed)
[employer] Discharge Instructions  *At any time if you have questions or concerns they can be answered by calling [phone]  All Patients: . You may experience an increase in your symptoms for the first 2 days (it can take 2 days to 2 weeks for the steroid/cortisone to have its maximal effect). . You may use ice to the site for the first 24 hours; 20 minutes on and 20 minutes off and may use heat after that time. . You may resume and continue your current pain medications. If you need a refill please contact the prescribing physician. . You may resume your medications if any were stopped for the procedure. . You may shower but no swimming, tub bath or Jacuzzi for 24 hours. . Please remove bandage after 4 hours. . You may resume light activities as tolerated. . If you had Spine Injection, you should not drive for the next 3 hours due to anesthetics used in the procedure. Please have someone drive for you.  *If you have had sedation, Valium, Xanax, or lorazepam: Do not drive or use public transportation for 24 hours, do not operating hazardous machinery or make important personal/business decisions for 24 hours.  POSSIBLE STEROID SIDE EFFECTS: If experienced these should only last for a short period. Change in menstrual flow  Edema in (swelling)  Increased appetite Skin flushing (redness)  Skin rash/acne  Thrush (oral) Vaginitis    Increased sweating  Depression Increased blood glucose levels Cramping and leg/calf  Euphoria (feeling happy)  POSSIBLE PROCEDURE SIDE EFFECTS: Please call our office if concerned. Increased pain Increased numbness/tingling  Headache Nausea/vomiting Hematoma (bruising/bleeding) Edema (swelling at the site) Weakness  Infection (red/drainage at site) Fever greater than 100.2F  *In the event of a headache after epidural steroid injection: Drink plenty of fluids, especially water and try to lay flat when possible. If the headache does not get  better after a few days or as always if concerned please call the office. 3

## 2020-12-23 NOTE — Progress Notes (Signed)
Pt state lower back pain that travels dow to both thihgs and stops at the knees. Pt state walking, standing and bending makes the pain worse. Pt state he excise, use TENS untis and take pain meds to help ease the pain.  Numeric Pain Rating Scale and Functional Assessment Average Pain 8   In the last MONTH (on 0-10 scale) has pain interfered with the following?  1. General activity like being  able to carry out your everyday physical activities such as walking, climbing stairs, carrying groceries, or moving a chair?  Rating(9)   +Driver, -BT, -Dye Allergies.

## 2020-12-30 ENCOUNTER — Ambulatory Visit: Payer: Medicare Other | Admitting: Gastroenterology

## 2020-12-30 ENCOUNTER — Encounter: Payer: Self-pay | Admitting: Gastroenterology

## 2020-12-30 ENCOUNTER — Other Ambulatory Visit (INDEPENDENT_AMBULATORY_CARE_PROVIDER_SITE_OTHER): Payer: Medicare Other

## 2020-12-30 DIAGNOSIS — K7030 Alcoholic cirrhosis of liver without ascites: Secondary | ICD-10-CM | POA: Diagnosis not present

## 2020-12-30 DIAGNOSIS — K259 Gastric ulcer, unspecified as acute or chronic, without hemorrhage or perforation: Secondary | ICD-10-CM

## 2020-12-30 LAB — COMPREHENSIVE METABOLIC PANEL
ALT: 41 U/L (ref 0–53)
AST: 41 U/L — ABNORMAL HIGH (ref 0–37)
Albumin: 3.9 g/dL (ref 3.5–5.2)
Alkaline Phosphatase: 87 U/L (ref 39–117)
BUN: 21 mg/dL (ref 6–23)
CO2: 26 mEq/L (ref 19–32)
Calcium: 10.4 mg/dL (ref 8.4–10.5)
Chloride: 104 mEq/L (ref 96–112)
Creatinine, Ser: 1.21 mg/dL (ref 0.40–1.50)
GFR: 60.12 mL/min (ref >60.00)
Glucose, Bld: 253 mg/dL — ABNORMAL HIGH (ref 70–99)
Potassium: 4.5 mEq/L (ref 3.5–5.1)
Sodium: 136 mEq/L (ref 135–145)
Total Bilirubin: 0.8 mg/dL (ref 0.2–1.2)
Total Protein: 7.5 g/dL (ref 6.0–8.3)

## 2020-12-30 LAB — CBC
HCT: 32.7 % — ABNORMAL LOW (ref 39.0–52.0)
Hemoglobin: 11.1 g/dL — ABNORMAL LOW (ref 13.0–17.0)
MCHC: 34 g/dL (ref 30.0–36.0)
MCV: 106 fl — ABNORMAL HIGH (ref 78.0–100.0)
Platelets: 99 10*3/uL — ABNORMAL LOW (ref 150.0–400.0)
RBC: 3.08 Mil/uL — ABNORMAL LOW (ref 4.22–5.81)
RDW: 15.6 % — ABNORMAL HIGH (ref 11.5–15.5)
WBC: 6.2 10*3/uL (ref 4.0–10.5)

## 2020-12-30 LAB — PROTIME-INR
INR: 1.2 ratio — ABNORMAL HIGH (ref 0.8–1.0)
Prothrombin Time: 12.9 s (ref 9.6–13.1)

## 2020-12-30 MED ORDER — PROPRANOLOL HCL 80 MG PO TABS: 80.0000 mg | ORAL_TABLET | Freq: Two times a day (BID) | ORAL | 6 refills | Status: DC

## 2020-12-30 NOTE — Patient Instructions (Addendum)
If you are age 72 or older, your body mass index should be between 23-30. Your Body mass index is 28.18 kg/m. If this is out of the aforementioned range listed, please consider follow up with your Primary Care Provider.  Your provider has requested that you go to the basement level for lab work before leaving today. Press "B" on the elevator. The lab is located at the first door on the left as you exit the elevator.  Due to recent changes in healthcare laws, you may see the results of your imaging and laboratory studies on MyChart before your provider has had a chance to review them.  We understand that in some cases there may be results that are confusing or concerning to you. Not all laboratory results come back in the same time frame and the provider may be waiting for multiple results in order to interpret others.  Please give [place] 48 hours in order for your provider to thoroughly review all the results before contacting the office for clarification of your results.   We have sent the following medications to your pharmacy for you to pick up at your convenience:  INCREASE: propralolol to 80mg  one tablet twice daily.  Please return to clinic on 01-13-2021 at 10am for nurse visit for blood pressure and pulse check.  You will follow up in the office with me in 6 months (August 2022).  Our office will contact you for an appointment.  Please abstain from consuming alcohol.  Thank you for entrusting me with your care and choosing [facility].  Dr Ardis Hughs

## 2020-12-30 NOTE — Progress Notes (Signed)
Review of pertinent gastrointestinal problems: 1.Cirrhosis, etoh related, diagnosed 2019.Long history of alcohol abuse:beer, bourbon. Workup 2019 labs ANA negative, normal iron studies, Hep C Ab negative, Hep B Surface Ab negative, Hep B Surface Ag negative, Hep A total Ab negative.   Imaging:US October 2019 showed nodule lower liver. No focal lesions. Ultrasound January 2021 no focal liverlesion. +Mild to moderate ascites. Ultrasound 10/2020 cirrhosis without mass lesions.  EGD 08/2018:Grade II distal esophagus varices, several small distal gastric ulcers (path H. Pylori negative). Started on nadolol 20mg  daily, changed to propranolol 20 BID at some point.  EGD May 2021 showed several trunks of large lower esophageal varices.  Also moderate severe changes of portal gastropathy, no gastric varices.  His propranolol was increased to 40 twice daily. 05/2020 increase propranolol to 60 mg twice daily  Still drinking  February 2022  Meld score 9 based on July 2021 labs 2.History of adenomatous colon polyps.Colonoscopy 2012 2 subcentimeter adenomas removed. Colonoscopy 2/2019normal examination. Recall recommended 10 years   HPI: This is a very pleasant 70 year old man  I last saw him here in the office about 6 or 7 months ago. I increased his propranolol so that he was taking 60 mg twice daily. He never returned for the nurse visit afterwards to check his heart rate and blood pressure.  His weight is up 4 pounds since his last office visit here.  He has been very good about taking his propranolol twice daily.  He has had no overt GI bleeding [name] melena, hematemesis or hematochezia.  No abdominal pains.  No troubles with swelling in his legs or abdomen.  He does admit to periodic difficulty concentrating.  He moves his bowels once or twice daily.  He continues to drink alcohol, every other day 4-6 beers   ROS: complete GI ROS as described in HPI, all other review  negative.  Constitutional:  No unintentional weight loss   Past Medical History:  Diagnosis Date  . Allergy   . Anemia   . Arthritis   . CAD (coronary artery disease)    s/p bifurcation stenting (DES x2) and stenting bifurcation lesion distal RCA (1 DES)  . Cataract   . Cirrhosis ([place])   . Depression   . Diabetes mellitus   . Esophageal varices ([place])   . GERD (gastroesophageal reflux disease)   . Hyperlipemia   . Hypertension   . Myocardial infarction ([place])   . Sleep apnea    CPAP    Past Surgical History:  Procedure Laterality Date  . BIOPSY  09/01/2018   Procedure: BIOPSY;  Surgeon: David Pole, MD;  Location: [facility];  Service: Endoscopy;;  . CATARACT EXTRACTION Bilateral   . CORONARY ANGIOPLASTY WITH STENT PLACEMENT   March 2010   Dr. Olevia [name]  . ESOPHAGOGASTRODUODENOSCOPY (EGD) WITH PROPOFOL N/A 09/01/2018   Procedure: ESOPHAGOGASTRODUODENOSCOPY (EGD) WITH PROPOFOL;  Surgeon: David Pole, MD;  Location: [facility];  Service: Endoscopy;  Laterality: N/A;  . LEFT HEART CATH AND CORONARY ANGIOGRAPHY N/A 08/31/2018   Procedure: LEFT HEART CATH AND CORONARY ANGIOGRAPHY;  Surgeon: David Mocha, MD;  Location: [facility];  Service: Cardiovascular;  Laterality: N/A;  . TONSILLECTOMY  1959    Current Outpatient Medications  Medication Sig Dispense Refill  . ALPHA LIPOIC ACID PO Take 1 tablet by mouth daily.    . Ascorbic Acid (VITAMIN C) 1000 MG tablet Take 1,000 mg by mouth daily.    [name] b complex vitamins tablet Take 1 tablet by mouth  daily.    . Cholecalciferol (D3 VITAMIN PO) Take 1 tablet by mouth daily.    . Coenzyme Q10 (CO Q 10) 100 MG CAPS Take 1 capsule by mouth daily.    . ferrous sulfate 325 (65 FE) MG tablet Take 325 mg by mouth daily with breakfast.    . furosemide (LASIX) 20 MG tablet Take 1 tablet by mouth once daily 90 tablet 1  . glucose blood (ONE TOUCH ULTRA TEST) test strip Use to test blood sugar 2 times daily as instructed.  Dx: E11.59, E11.65 100 each 5  . insulin lispro (HUMALOG KWIKPEN) 200 UNIT/ML KwikPen INJECT 14-16 UNITS INTO THE SKIN THREE TIMES DAILY BEFORE MEALS 30 mL 3  . insulin NPH Human (NOVOLIN N RELION) 100 UNIT/ML injection Inject under skin 30 units in a.m. and 35-40 units at bedtime 20 mL 11  . Insulin Pen Needle 32G X 4 MM MISC Use 4x a day - with insulin 200 each 3  . Insulin Syringe-Needle U-100 (RELION INSULIN SYRINGE) 31G X 15/64" 0.5 ML MISC USE  TWICE DAILY 100 each 5  . losartan (COZAAR) 50 MG tablet Take 75 mg by mouth daily. Takes 1 and 1/2 tablet qd (total 75mg )    . metFORMIN (GLUCOPHAGE) 1000 MG tablet TAKE 1 TABLET BY MOUTH TWICE DAILY WITH MEALS 180 tablet 1  . Methylsulfonylmethane (MSM PO) Take 1 tablet by mouth daily.    . Multiple Vitamin (MULTIVITAMIN WITH MINERALS) TABS tablet Take 1 tablet by mouth at bedtime.    Glory Rosebush DELICA LANCETS FINE MISC Use to test blood sugar 2 times daily as instructed. Dx: E11.59, E11.65 100 each 5  . pantoprazole (PROTONIX) 40 MG tablet Take 1 tablet by mouth twice daily 180 tablet 3  . PARoxetine (PAXIL) 20 MG tablet Take 40 mg by mouth daily. Take 2 tablets qd (total 40mg )    . propranolol (INDERAL) 60 MG tablet Take 1 tablet (60 mg total) by mouth 2 (two) times daily. 60 tablet 6  . rosuvastatin (CRESTOR) 10 MG tablet Take 1 tablet by mouth once daily 90 tablet 1  . Semaglutide,0.25 or 0.5MG /DOS, (OZEMPIC, 0.25 OR 0.5 MG/DOSE,) 2 MG/1.5ML SOPN Inject 0.5 mg into the skin once a week. 4.5 mL 3  . sildenafil (VIAGRA) 100 MG tablet Take 1 tablet (100 mg total) by mouth daily as needed for erectile dysfunction. 10 tablet 5  . spironolactone (ALDACTONE) 50 MG tablet Take 1 tablet by mouth once daily 90 tablet 1  . triamcinolone (KENALOG) 0.1 % Apply 1 application topically 2 (two) times daily as needed. Apply to scalp    . HYDROcodone-acetaminophen (NORCO) 5-325 MG tablet Take 1 tablet by mouth every 6 (six) hours as needed for moderate pain.  (Patient not taking: Reported on 12/30/2020) 20 tablet 0   Current Facility-Administered Medications  Medication Dose Route Frequency Provider Last Rate Last Admin  . dexamethasone (DECADRON) injection 15 mg  15 mg Other Once Magnus Sinning, MD        Allergies as of 12/30/2020 - Review Complete 12/30/2020  Allergen Reaction Noted  . Venlafaxine  07/09/2014    Family History  Problem Relation Age of Onset  . Heart failure Father        Deceased 84 y/o  . Diabetes Father   . Cancer Father        Prostate  . Heart failure Mother        Deceased 21 y/o  . Alcohol abuse Mother   . Schizophrenia  Son        Paranoid  . Uveitis Son   . Cancer Brother        prostate  . Colon cancer Neg Hx   . Esophageal cancer Neg Hx   . Stomach cancer Neg Hx   . Liver cancer Neg Hx   . Pancreatic cancer Neg Hx   . Rectal cancer Neg Hx     Social History   Socioeconomic History  . Marital status: Legally Separated    Spouse name: Not on file  . Number of children: 2  . Years of education: Not on file  . Highest education level: Not on file  Occupational History  . Occupation: Patent examiner buildings    Comment: site closed  . Occupation: Engineering geologist    Comment: part time  Tobacco Use  . Smoking status: Former Smoker    Types: Cigarettes    Quit date: 11/30/1983    Years since quitting: 37.1  . Smokeless tobacco: Never Used  Vaping Use  . Vaping Use: Never used  Substance and Sexual Activity  . Alcohol use: Yes    Alcohol/week: 20.0 standard drinks    Types: 20 Cans of beer per week  . Drug use: No  . Sexual activity: Not Currently  Other Topics Concern  . Not on file  Social History Narrative   No living will   Would want want son [name], as health care POA   Would accept resuscitation   Not sure about tube feeds--but doesn't want if cognitively unaware   Social Determinants of Health   Financial Resource Strain: Not on file  Food Insecurity: Not on file   Transportation Needs: Not on file  Physical Activity: Not on file  Stress: Not on file  Social Connections: Not on file  Intimate Partner Violence: Not on file     Physical Exam: BP 124/72   Pulse 84   Ht 5\' 9"  (1.753 m)   Wt 190 lb 12.8 oz (86.5 kg)   SpO2 97%   BMI 28.18 kg/m  Constitutional: generally well-appearing Psychiatric: alert and oriented x3 Abdomen: soft, nontender, nondistended, no obvious ascites, no peritoneal signs, normal bowel sounds No peripheral edema noted in lower extremities  Assessment and plan: 70 y.o. male with alcoholic cirrhosis, still drinking  First he is still drinking alcohol, 4-6 beers every other day or so.  We discussed this again.  He knows that it is quite detrimental to his health and longevity.  He will continue to do the best he can to try to quit.  He has large esophageal varices and is on nonselective beta-blocker.  Current propranolol dose 60 twice daily he clearly has room to increase this based on heart rate and blood pressure today.  We will give him a new prescription for 80 mg propranolol which she will take twice daily and he will return for a nurse visit in about 2 weeks for blood pressure and heart rate check.  He needs labs to recalculate current meld score with CBC, complete metabolic profile, coags.  If all is doing well we will continue titrating his propranolol upward as tolerated and he will return to see me in about 6 months for routine cirrhosis follow-up.  Please see the "Patient Instructions" section for addition details about the plan.  David Loffler, MD [facility] 12/30/2020, 11:11 AM   Total time on date of encounter was 30 minutes (this included time spent preparing to see the patient reviewing records;  obtaining and/or reviewing separately obtained history; performing a medically appropriate exam and/or evaluation; counseling and educating the patient and family if present; ordering medications,  tests or procedures if applicable; and documenting clinical information in the health record).

## 2021-01-01 ENCOUNTER — Encounter: Payer: Self-pay | Admitting: Physical Medicine and Rehabilitation

## 2021-01-02 NOTE — Progress Notes (Signed)
David [name] - 70 y.o. male MRN [id number]  Date of birth: 01/05/1951  Office Visit Note: Visit Date: 12/23/2020 PCP: [name], MD Referred by: [name], MD  Subjective: Chief Complaint  Patient presents with  . Lower Back - Pain  . Left Thigh - Pain  . Right Thigh - Pain  . Left Knee - Pain  . Right Knee - Pain   HPI:  David [name] is a 70 y.o. male who comes in today at the request of Benita Stabile, PA-C for planned Bilateral L3-L4 Lumbar epidural steroid injection with fluoroscopic guidance.  The patient has failed conservative care including home exercise, medications, time and activity modification.  This injection will be diagnostic and hopefully therapeutic.  Please see requesting physician notes for further details and justification.  MRI reviewed with images and spine model.  MRI reviewed in the note below.  May benefit from surgical decompression.  Case is complicated by diabetes.  ROS Otherwise per HPI.  Assessment & Plan: Visit Diagnoses:    ICD-10-CM   1. Lumbar radiculopathy  M54.16 XR C-ARM NO REPORT    Epidural Steroid injection    dexamethasone (DECADRON) injection 15 mg    Plan: No additional findings.   Meds & Orders:  Meds ordered this encounter  Medications  . dexamethasone (DECADRON) injection 15 mg    Orders Placed This Encounter  Procedures  . XR C-ARM NO REPORT  . Epidural Steroid injection    Follow-up: Return for visit to requesting physician as needed.   Procedures: No procedures performed  Lumbosacral Transforaminal Epidural Steroid Injection - Sub-Pedicular Approach with Fluoroscopic Guidance  Patient: David [name]      Date of Birth: 01/19/1951 MRN: [id number] PCP: [name], MD      Visit Date: 12/23/2020   Universal Protocol:    Date/Time: 12/23/2020  Consent Given By: the patient  Position: PRONE  Additional Comments: Vital signs were monitored before and after the procedure. Patient was prepped  and draped in the usual sterile fashion. The correct patient, procedure, and site was verified.   Injection Procedure Details:   Procedure diagnoses: Lumbar radiculopathy [M54.16]    Meds Administered:  Meds ordered this encounter  Medications  . dexamethasone (DECADRON) injection 15 mg    Laterality: Bilateral  Location/Site:  L3-L4  Needle:5.0 in., 22 ga.  Short bevel or Quincke spinal needle  Needle Placement: Transforaminal  Findings:    -Comments: Excellent flow of contrast along the nerve, nerve root and into the epidural space.  Procedure Details: After squaring off the end-plates to get a true AP view, the C-arm was positioned so that an oblique view of the foramen as noted above was visualized. The target area is just inferior to the "nose of the scotty dog" or sub pedicular. The soft tissues overlying this structure were infiltrated with 2-3 ml. of 1% Lidocaine without Epinephrine.  The spinal needle was inserted toward the target using a "trajectory" view along the fluoroscope beam.  Under AP and lateral visualization, the needle was advanced so it did not puncture dura and was located close the 6 O'Clock position of the pedical in AP tracterory. Biplanar projections were used to confirm position. Aspiration was confirmed to be negative for CSF and/or blood. A 1-2 ml. volume of Isovue-250 was injected and flow of contrast was noted at each level. Radiographs were obtained for documentation purposes.   After attaining the desired flow of contrast documented above, a 0.5 to 1.0 ml  test dose of 0.25% Marcaine was injected into each respective transforaminal space.  The patient was observed for 90 seconds post injection.  After no sensory deficits were reported, and normal lower extremity motor function was noted,   the above injectate was administered so that equal amounts of the injectate were placed at each foramen (level) into the transforaminal epidural  space.   Additional Comments:  The patient tolerated the procedure well Dressing: 2 x 2 sterile gauze and Band-Aid    Post-procedure details: Patient was observed during the procedure. Post-procedure instructions were reviewed.  Patient left the clinic in stable condition.      Clinical History: MRI LUMBAR SPINE WITHOUT CONTRAST  TECHNIQUE: Multiplanar, multisequence MR imaging of the lumbar spine was performed. No intravenous contrast was administered.  COMPARISON:  None.  FINDINGS: Segmentation: 5 lumbar type vertebrae based on the available coverage  Alignment: Reversal of lumbar lordosis. Dextroscoliosis. T11-12 anterolisthesis at L1-2, L2-3 retrolisthesis.  Vertebrae:  No fracture, evidence of discitis, or bone lesion.  Conus medullaris and cauda equina: Conus extends to the L1 level. Conus appears normal. There is cauda equina redundancy due to the degree of severe spinal stenosis.  Paraspinal and other soft tissues: Fatty infiltration of intrinsic back muscles.  Disc levels:  Lumbar pedicles are diffusely short and there is epidural lipomatosis from L2-S1.  T11-12: Facet spurring and ligamentum flavum thickening. There is anterolisthesis and disc bulging. Spinal stenosis with cord flattening. This level is not covered on axial slices; no definite cord edema.  T12- L1: Unremarkable.  L1-L2: Disc narrowing and bulging with endplate ridging. No neural compression.  L2-L3: Disc narrowing and bulging broad central protrusion. Advanced thecal sac compression. Moderate left foraminal stenosis.  L3-L4: Disc narrowing and endplate degeneration with left eccentric bulging. Asymmetric left facet and endplate spurring. Complete thecal sac effacement of CSF. Advanced left foraminal stenosis  L4-L5: Disc narrowing and bulging with broad central protrusion. Degenerative facet spurring on both sides. Complete thecal sac effacement of CSF. Right  more than left foraminal stenosis, moderate to advanced on the right.  L5-S1:Disc narrowing with right eccentric endplate degeneration and disc bulging. Mild facet spurring. Complete thecal sac effacement. Moderate right foraminal stenosis.  IMPRESSION: 1. Diffuse degenerative disease with scoliosis and multilevel listhesis. 2. Degenerative disease, epidural lipomatosis, and short pedicles causes advanced thecal sac effacement from L2-3 to L5-S1. 3. 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 4. Foraminal impingement most notably on the left at L3-4 and right at L4-5, L5-S1.   Electronically Signed   By: Monte Fantasia M.D.   On: 11/17/2020 04:57     Objective:  VS:  HT:    WT:   BMI:     BP:126/76  HR:71bpm  TEMP: ( )  RESP:  Physical Exam Vitals and nursing note reviewed.  Constitutional:      General: He is not in acute distress.    Appearance: Normal appearance. He is not ill-appearing.  HENT:     Head: Normocephalic and atraumatic.     Right Ear: External ear normal.     Left Ear: External ear normal.     Nose: No congestion.  Eyes:     Extraocular Movements: Extraocular movements intact.  Cardiovascular:     Rate and Rhythm: Normal rate.     Pulses: Normal pulses.  Pulmonary:     Effort: Pulmonary effort is normal. No respiratory distress.  Abdominal:     General: There is no  distension.     Palpations: Abdomen is soft.  Musculoskeletal:        General: No tenderness or signs of injury.     Cervical back: Neck supple.     Right lower leg: No edema.     Left lower leg: No edema.     Comments: Patient has good distal strength without clonus.  Skin:    Findings: No erythema or rash.  Neurological:     General: No focal deficit present.     Mental Status: He is alert and oriented to person, place, and time.     Sensory: No sensory deficit.     Motor: No weakness or abnormal muscle tone.      Coordination: Coordination normal.  Psychiatric:        Mood and Affect: Mood normal.        Behavior: Behavior normal.      Imaging: No results found.

## 2021-01-02 NOTE — Procedures (Signed)
Lumbosacral Transforaminal Epidural Steroid Injection - Sub-Pedicular Approach with Fluoroscopic Guidance  Patient: David [name]      Date of Birth: 03/19/1951 MRN: [id number] PCP: [name], MD      Visit Date: 12/23/2020   Universal Protocol:    Date/Time: 12/23/2020  Consent Given By: the patient  Position: PRONE  Additional Comments: Vital signs were monitored before and after the procedure. Patient was prepped and draped in the usual sterile fashion. The correct patient, procedure, and site was verified.   Injection Procedure Details:   Procedure diagnoses: Lumbar radiculopathy [M54.16]    Meds Administered:  Meds ordered this encounter  Medications  . dexamethasone (DECADRON) injection 15 mg    Laterality: Bilateral  Location/Site:  L3-L4  Needle:5.0 in., 22 ga.  Short bevel or Quincke spinal needle  Needle Placement: Transforaminal  Findings:    -Comments: Excellent flow of contrast along the nerve, nerve root and into the epidural space.  Procedure Details: After squaring off the end-plates to get a true AP view, the C-arm was positioned so that an oblique view of the foramen as noted above was visualized. The target area is just inferior to the "nose of the scotty dog" or sub pedicular. The soft tissues overlying this structure were infiltrated with 2-3 ml. of 1% Lidocaine without Epinephrine.  The spinal needle was inserted toward the target using a "trajectory" view along the fluoroscope beam.  Under AP and lateral visualization, the needle was advanced so it did not puncture dura and was located close the 6 O'Clock position of the pedical in AP tracterory. Biplanar projections were used to confirm position. Aspiration was confirmed to be negative for CSF and/or blood. A 1-2 ml. volume of Isovue-250 was injected and flow of contrast was noted at each level. Radiographs were obtained for documentation purposes.   After attaining the desired flow of  contrast documented above, a 0.5 to 1.0 ml test dose of 0.25% Marcaine was injected into each respective transforaminal space.  The patient was observed for 90 seconds post injection.  After no sensory deficits were reported, and normal lower extremity motor function was noted,   the above injectate was administered so that equal amounts of the injectate were placed at each foramen (level) into the transforaminal epidural space.   Additional Comments:  The patient tolerated the procedure well Dressing: 2 x 2 sterile gauze and Band-Aid    Post-procedure details: Patient was observed during the procedure. Post-procedure instructions were reviewed.  Patient left the clinic in stable condition.

## 2021-01-13 ENCOUNTER — Telehealth: Payer: Self-pay

## 2021-01-13 ENCOUNTER — Ambulatory Visit (INDEPENDENT_AMBULATORY_CARE_PROVIDER_SITE_OTHER): Payer: Medicare Other | Admitting: Gastroenterology

## 2021-01-13 VITALS — BP 98/56 | HR 66

## 2021-01-13 DIAGNOSIS — K7030 Alcoholic cirrhosis of liver without ascites: Secondary | ICD-10-CM

## 2021-01-13 DIAGNOSIS — Z0130 Encounter for examination of blood pressure without abnormal findings: Secondary | ICD-10-CM

## 2021-01-13 NOTE — Telephone Encounter (Signed)
Patient came to the office today for 2 week BP and pulse check after increasing propranolol 60mg  twice daily to propranolol 80mg  twice daily. Patient does not report any light headedness or dizziness after increasing medication.  BP sitting was 100/60 and 98/56 standing, pulse 66.  Patient advised that BP and pulse reading would be sent to Dr Ardis Hughs for his review.  Patient will be advised of any medication changes.  Patient agreed to plan and verbalized understanding.  No further questions.

## 2021-01-14 NOTE — Progress Notes (Signed)
Patient came to the office today for 2 week BP and pulse check after increasing propranolol 60mg  twice daily to propranolol 80mg  twice daily. Patient does not report any light headedness or dizziness after increasing medication.  BP sitting was 100/60 and 98/56 standing, pulse 66.  Patient advised that BP and pulse reading would be sent to Dr Ardis Hughs for his review.  Patient will be advised of any medication changes.  Patient agreed to plan and verbalized understanding.  No further questions.

## 2021-01-15 NOTE — Progress Notes (Signed)
Great, thanks. No further changes to his meds.  He needs OV in 6 months for cirrhosis follow up.

## 2021-01-18 ENCOUNTER — Encounter: Payer: Self-pay | Admitting: Physical Medicine and Rehabilitation

## 2021-01-19 ENCOUNTER — Other Ambulatory Visit: Payer: Self-pay | Admitting: Physical Medicine and Rehabilitation

## 2021-01-19 MED ORDER — GABAPENTIN 600 MG PO TABS: ORAL_TABLET | ORAL | 0 refills | Status: DC

## 2021-01-19 NOTE — Progress Notes (Signed)
Gabapentin rx

## 2021-01-27 ENCOUNTER — Telehealth: Payer: Self-pay

## 2021-01-27 ENCOUNTER — Telehealth: Payer: Self-pay | Admitting: Physical Medicine and Rehabilitation

## 2021-01-27 NOTE — Telephone Encounter (Signed)
Called patient back to schedule L4 TF. He asked if he could call me back in 5 minutes. I advised that I would be in the injection room and I would try to call him back later.

## 2021-01-27 NOTE — Telephone Encounter (Signed)
Pt called to make an apt with [facility]

## 2021-01-27 NOTE — Telephone Encounter (Signed)
Called and left message to schedule L4 TF.

## 2021-01-27 NOTE — Telephone Encounter (Signed)
Patient called. He would like an appointment with Dr. Ernestina Patches. His call back number is [phone]

## 2021-01-27 NOTE — Telephone Encounter (Signed)
See previous message

## 2021-01-27 NOTE — Telephone Encounter (Signed)
Patient returned call asked for a call back

## 2021-01-28 ENCOUNTER — Telehealth: Payer: Medicare Other

## 2021-01-28 ENCOUNTER — Telehealth: Payer: Self-pay

## 2021-01-28 NOTE — Progress Notes (Deleted)
Chronic Care Management Pharmacy Note  01/28/2021 Name:  David [name] MRN:  [id number] DOB:  09/12/1951  Subjective: David [name] is an 70 y.o. year old male who is a primary patient of [name], MD.  The CCM team was consulted for assistance with disease management and care coordination needs.    Engaged with patient by telephone for follow up visit in response to provider referral for pharmacy case management and/or care coordination services.   Consent to Services:  The patient was given information about Chronic Care Management services, agreed to services, and gave verbal consent prior to initiation of services.  Please see initial visit note for detailed documentation.   Patient Care Team: [name], MD as PCP - Cyndia Diver, MD as PCP - Cardiology (Cardiology) Debbora Dus, [facility] as Pharmacist (Pharmacist)  Recent office visits: 06/10/20 Dr. Wilhemena Durie - PCP  Recent consult visits: 01/13/21 - Dr. Owens Loffler- Gastroenterology - Patient came to the office today for 2 week BP and pulse check after increasing propranolol 53m twice daily to propranolol 844mtwice daily. Patient does not report any light headedness or dizziness after increasing medication. BP sitting was 100/60 and 98/56 standing, pulse 66. Continue meds. RTC 6 months. 11/24/20- Dr. ChJean RosenthalOrthopedics 11/03/20- Dr. C.Ardeth SportsmanEndo - Taking Humalog three days per week, increased alcohol. BG elevated. 10/28/20 Dr. ChJean RosenthalOrthopedics 09/23/11 Dr. MiSherren MochaCardiology 09/08/20 Dr. NoIla McgillPodiatry 07/28/20 Dr. C.Ardeth Sportsmannternal medicine 06/23/20 Dr. DaOwens LofflerGastroenterology  Hospital visits: None in previous 6 months  Objective:  Lab Results  Component Value Date   CREATININE 1.21 12/30/2020   BUN 21 12/30/2020   GFR 60.12 12/30/2020   GFRNONAA >60 08/31/2018   GFRAA >60 08/31/2018   NA 136 12/30/2020   K 4.5 12/30/2020   CALCIUM  10.4 12/30/2020   CO2 26 12/30/2020    Lab Results  Component Value Date/Time   HGBA1C 7.5 (A) 11/03/2020 11:24 AM   HGBA1C 7.5 (A) 07/28/2020 11:00 AM   HGBA1C 8.9 (H) 12/11/2019 11:05 AM   HGBA1C 7.0 (H) 08/28/2018 06:45 PM   GFR 60.12 12/30/2020 11:43 AM   GFR 59.64 (L) 06/23/2020 02:50 PM   MICROALBUR 9.1 (H) 02/11/2010 10:11 AM   MICROALBUR 1.8 04/16/2009 09:52 AM    Last diabetic Eye exam:  Lab Results  Component Value Date/Time   HMDIABEYEEXA No Retinopathy 12/24/2019 12:00 AM    Last diabetic Foot exam:  Lab Results  Component Value Date/Time   HMDIABFOOTEX done 11/27/2018 12:00 AM     Lab Results  Component Value Date   CHOL 112 12/11/2019   HDL 46.60 12/11/2019   LDLCALC 52 12/11/2019   LDLDIRECT 103.9 11/05/2013   TRIG 65.0 12/11/2019   CHOLHDL 2 12/11/2019    Hepatic Function Latest Ref Rng & Units 12/30/2020 06/23/2020 06/10/2020  Total Protein 6.0 - 8.3 g/dL 7.5 7.9 -  Albumin 3.5 - 5.2 g/dL 3.9 4.2 4.1  AST 0 - 37 U/L 41(H) 47(H) -  ALT 0 - 53 U/L 41 44 -  Alk Phosphatase 39 - 117 U/L 87 84 -  Total Bilirubin 0.2 - 1.2 mg/dL 0.8 0.6 -  Bilirubin, Direct 0.0 - 0.3 mg/dL - - -    Lab Results  Component Value Date/Time   TSH 1.81 11/05/2013 12:49 PM   TSH 0.90 02/16/2011 11:01 AM   FREET4 0.87 05/16/2015 09:41 AM   FREET4 0.7 02/05/2009 04:40 PM    CBC Latest  Ref Rng & Units 12/30/2020 06/23/2020 06/10/2020  WBC 4.0 - 10.5 K/uL 6.2 7.0 7.3  Hemoglobin 13.0 - 17.0 g/dL 11.1(L) 10.2(L) 10.8(L)  Hematocrit 39.0 - 52.0 % 32.7(L) 29.8(L) 31.8(L)  Platelets 150.0 - 400.0 K/uL 99.0(L) 95.0(L) 99.0(L)    No results found for: VD25OH  Clinical ASCVD: Yes  The ASCVD Risk score Mikey Bussing DC Jr., et al., 2013) failed to calculate for the following reasons:   The valid total cholesterol range is 130 to 320 mg/dL    Social History   Tobacco Use  Smoking Status Former Smoker  . Types: Cigarettes  . Quit date: 11/30/1983  . Years since quitting: 37.1  Smokeless  Tobacco Never Used   BP Readings from Last 3 Encounters:  01/13/21 (!) 98/56  12/30/20 124/72  12/23/20 126/76   Pulse Readings from Last 3 Encounters:  01/13/21 66  12/30/20 84  12/23/20 71   Wt Readings from Last 3 Encounters:  12/30/20 190 lb 12.8 oz (86.5 kg)  11/03/20 190 lb (86.2 kg)  09/22/20 187 lb 3.2 oz (84.9 kg)    Assessment/Interventions: Review of patient past medical history, allergies, medications, health status, including review of consultants reports, laboratory and other test data, was performed as part of comprehensive evaluation and provision of chronic care management services.   SDOH:  (Social Determinants of Health) assessments and interventions performed: Yes   CCM Care Plan  Allergies  Allergen Reactions  . Venlafaxine     Slowed his urine stream    Medications Reviewed Today    Reviewed by Milus Banister, MD (Physician) on 12/30/20 at 1111  Med List Status: <None>  Medication Order Taking? Sig Documenting Provider Last Dose Status Informant  ALPHA LIPOIC ACID PO [id number] Yes Take 1 tablet by mouth daily. [provider] Taking Active   Ascorbic Acid (VITAMIN C) 1000 MG tablet [id number] Yes Take 1,000 mg by mouth daily. [provider] Taking Active   b complex vitamins tablet [id number] Yes Take 1 tablet by mouth daily. [provider] Taking Active   Cholecalciferol (D3 VITAMIN PO) [id number] Yes Take 1 tablet by mouth daily. [provider] Taking Active   Coenzyme Q10 (CO Q 10) 100 MG CAPS [id number] Yes Take 1 capsule by mouth daily. [provider] Taking Active   dexamethasone (DECADRON) injection 15 mg [id number]   Magnus Sinning, MD  Expired 12/23/20 1435   ferrous sulfate 325 (65 FE) MG tablet [id number] Yes Take 325 mg by mouth daily with breakfast. [provider] Taking Active   furosemide (LASIX) 20 MG tablet [id number] Yes Take 1 tablet by mouth once daily Milus Banister, MD Taking  Active   glucose blood (ONE TOUCH ULTRA TEST) test strip [id number] Yes Use to test blood sugar 2 times daily as instructed. Dx: E11.59, E11.65 Philemon Kingdom, MD Taking Active Self  HYDROcodone-acetaminophen (NORCO) 5-325 MG tablet [id number] No Take 1 tablet by mouth every 6 (six) hours as needed for moderate pain.  Patient not taking: Reported on 12/30/2020   Pete Pelt, PA-C Not Taking Active            Med Note Hardie Pulley, [place] Dec 30, 2020 11:04 AM) Not currently taking  insulin lispro (HUMALOG KWIKPEN) 200 UNIT/ML KwikPen [id number] Yes INJECT 14-16 UNITS INTO THE SKIN THREE TIMES DAILY BEFORE MEALS Philemon Kingdom, MD Taking Active   insulin NPH Human (NOVOLIN N RELION) 100 UNIT/ML injection [id number] Yes Inject under skin 30 units in a.m.  and 35-40 units at bedtime Philemon Kingdom, MD Taking Active   Insulin Pen Needle 32G X 4 MM MISC [id number] Yes Use 4x a day - with insulin Philemon Kingdom, MD Taking Active   Insulin Syringe-Needle U-100 (RELION INSULIN SYRINGE) 31G X 15/64" 0.5 ML MISC [id number] Yes USE  TWICE DAILY Philemon Kingdom, MD Taking Active   losartan (COZAAR) 50 MG tablet [id number] Yes Take 75 mg by mouth daily. Takes 1 and 1/2 tablet qd (total 74m) [provider] Taking Active   metFORMIN (GLUCOPHAGE) 1000 MG tablet [id number] TAKE 1 TABLET BY MOUTH TWICE DAILY WITH MEALS GPhilemon Kingdom MD Taking Active   Methylsulfonylmethane (MSM PO) [id number] Take 1 tablet by mouth daily. [provider] Taking Active   Multiple Vitamin (MULTIVITAMIN WITH MINERALS) TABS tablet [id number] Take 1 tablet by mouth at bedtime. [provider] Taking Active Self  OJonetta SpeakLANCETS [id number] Use to test blood sugar 2 times daily as instructed. Dx: E11.59, E11.65 GPhilemon Kingdom MD Taking Active Self  pantoprazole (PROTONIX) 40 MG tablet [id number] Take 1 tablet by mouth twice daily LVenia Carbon MD Taking  Active   PARoxetine (PAXIL) 20 MG tablet [id number] Take 40 mg by mouth daily. Take 2 tablets qd (total 463m [provider] Taking Active   propranolol (INDERAL) 60 MG tablet [id number] Take 1 tablet (60 mg total) by mouth 2 (two) times daily. JaMilus BanisterMD Taking Active   rosuvastatin (CRESTOR) 10 MG tablet [id number] Take 1 tablet by mouth once daily LeVenia CarbonMD Taking Active   Semaglutide,0.25 or 0.5MG/DOS, (OZEMPIC, 0.25 OR 0.5 MG/DOSE,) 2 MG/1.5ML SOPN [id number] Inject 0.5 mg into the skin once a week. GhPhilemon KingdomMD Taking Active   sildenafil (VIAGRA) 100 MG tablet [id number] Take 1 tablet (100 mg total) by mouth daily as needed for erectile dysfunction. LeVenia CarbonMD Taking Active   spironolactone (ALDACTONE) 50 MG tablet [id number] Take 1 tablet by mouth once daily JaMilus BanisterMD Taking Active   triamcinolone (KENALOG) 0.1 % [id number] Apply 1 application topically 2 (two) times daily as needed. Apply to scalp [provider] Taking Active           Patient Active Problem List   Diagnosis Date Noted  . BPH associated with nocturia 06/10/2020  . 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HCPortersville01/10/2020  . Hip pain 08/07/2019  . History of esophageal varices 09/15/2018  . Alcoholic cirrhosis of liver with ascites (HCThayer  . Secondary esophageal varices without bleeding (HCP[place]  . Anemia 08/31/2018  . Overweight 09/13/2017  . Poorly controlled type 2 diabetes mellitus with circulatory disorder (HCCalumet02/20/2018  . Medial epicondylitis of elbow, left 08/03/2016  . Elevated liver function tests 07/13/2016  . Thrombocytopenia (HCEmerson08/15/2017  . Trigger middle finger of left hand 03/02/2016  . Bilateral hand pain 12/02/2015  . Primary osteoarthritis of both first carpometacarpal joints 12/02/2015  . Advance directive discussed with patient 05/16/2015  . Routine general medical examination at a health care  facility 02/16/2011  . Erectile dysfunction 02/16/2011  . Essential hypertension 12/21/2010  . Hyperlipidemia LDL goal <70 06/24/2009  . Osteoarthritis, multiple sites 04/16/2009  . CAD (coronary artery disease) 02/04/2009  . Major depressive disorder, recurrent episode (HCPeoria09/23/2009  . ALLERGIC RHINITIS 08/21/2008  . Sleep apnea 08/21/2008    Immunization History  Administered Date(s) Administered  . Fluad Quad(high Dose 65+) 08/07/2019,  10/02/2020  . Hep A / Hep B 04/02/2020, 04/09/2020, 05/01/2020  . Influenza Split 08/30/2011, 09/18/2012  . Influenza Whole 08/21/2008, 10/02/2009, 08/18/2010  . Influenza, High Dose Seasonal PF 08/29/2018  . Influenza,inj,Quad PF,6+ Mos 11/05/2013, 09/12/2015, 08/27/2016, 09/02/2017  . PFIZER(Purple Top)SARS-COV-2 Vaccination 01/28/2020, 02/28/2020  . Pneumococcal Conjugate-13 10/09/2014  . Pneumococcal Polysaccharide-23 02/11/2010, 01/18/2017  . Td 11/29/2002  . Tdap 11/05/2013    Conditions to be addressed/monitored:  Hypertension, Hyperlipidemia and Diabetes  There are no care plans that you recently modified to display for this patient.   Current Barriers:  . {pharmacybarriers:24917} . ***  Pharmacist Clinical Goal(s):  [name] Over the next *** days, patient will {PHARMACYGOALCHOICES:24921} through collaboration with PharmD and provider.  . ***  Interventions: . 1:1 collaboration with [name], MD regarding development and update of comprehensive plan of care as evidenced by provider attestation and co-signature . Inter-disciplinary care team collaboration (see longitudinal plan of care) . Comprehensive medication review performed; medication list updated in electronic medical record  Hypertension (BP goal {CHL HP UPSTREAM Pharmacist BP ranges:[phone]}) -{US controlled/uncontrolled:25276} -Current treatment: Losartan 50 mg - 1 and 1/2 tablet daily Furosemide 20 mg - 1 tablet daily (per GI for swelling) Spironolactone  50 mg - 1 tablet daily (per GI for swelling) -Medications previously tried: ***  -Current home readings: *** -Current dietary habits: *** -Current exercise habits: *** -{ACTIONS;DENIES/REPORTS:21021675::"Denies"} hypotensive/hypertensive symptoms -Educated on {CCM BP Counseling:25124} -Counseled to monitor BP at home ***, document, and provide log at future appointments -{CCMPHARMDINTERVENTION:25122}  Diabetes (A1c goal {A1c goals:23924}) -{US controlled/uncontrolled:25276} -Current medications: Metformin1066m- 1 tablet BID with meals InsulinNPH- Inject30unitsin am and 35-40 at night Humalog- Inject14-16units15 min before a meal      Ozepmic- 0.538mweekly -Medications previously tried: ***  -Current home glucose readings . fasting glucose: *** . post prandial glucose: *** -{ACTIONS;DENIES/REPORTS:21021675::"Denies"} hypoglycemic/hyperglycemic symptoms -Current meal patterns:  . breakfast: ***  . lunch: ***  . dinner: *** . snacks: *** . drinks: *** -Current exercise: *** -Educated on{CCM DM COUNSELING:25123} -Counseled to check feet daily and get yearly eye exams -{CCMPHARMDINTERVENTION:25122}  *** (Goal: ***) -{US controlled/uncontrolled:25276} -Current treatment  . *** -Medications previously tried: ***  -{CCMPHARMDINTERVENTION:25122}   Patient Goals/Self-Care Activities . Over the next *** days, patient will:  - {pharmacypatientgoals:24919}  Follow Up Plan: {CM FOLLOW UP PLJGYL:69437}Medication Assistance: {MEDASSISTANCEINFO:25044}  Patient[place] pharmacy [place]:  [place]one: [phone]: [address] [address], [place]one: [phone]: [phone] pill box? {Yes or If no, why not?:20788} Pt endorses ***% compliance  We  discussed: {Pharmacy options:24294} Patient decided to: {US Pharmacy Plan:23885}  Care Plan and Follow Up Patient Decision:  {FOLLOWUP:24991}  Plan: {CM FOLLOW UP PLAN:25073}  ***

## 2021-01-28 NOTE — Telephone Encounter (Signed)
Scheduled for 3/8 at 1430. Patient states that he does not know why he needs a driver, but I advised that we could not do an injection if he did not have a driver with him.

## 2021-01-28 NOTE — Telephone Encounter (Signed)
Pt not req Auth#. 

## 2021-01-28 NOTE — Telephone Encounter (Signed)
See previous message

## 2021-01-30 NOTE — Telephone Encounter (Signed)
Attempted to reach patient for CCM follow up scheduled on 01/28/21 at 1 PM. Reviewed chart. Will have CMA contact later this month to review BG and BP logs and rescheduling.  Recent office visits: 06/10/20 Dr. Wilhemena Durie - PCP  Recent consult visits: 01/13/21 - Dr. Owens Loffler- Gastroenterology - Patient came to the office today for 2 week BP and pulse check after increasing propranolol 60mg  twice daily to propranolol 80mg  twice daily. Patient does not report any light headedness or dizziness after increasing medication. BP sitting was 100/60 and 98/56 standing, pulse 66. Continue meds. RTC 6 months. 11/24/20- Dr. Jean Rosenthal- Orthopedics 11/03/20- Dr. Ardeth Sportsman- Endo - Taking Humalog three days per week, increased alcohol. BG elevated. 10/28/20 Dr. Jean Rosenthal- Orthopedics 09/23/11 Dr. Sherren Mocha- Cardiology 09/08/20 Dr. Ila Mcgill- Podiatry 07/28/20 Dr. Ardeth Sportsman Internal medicine 06/23/20 Dr. Owens Loffler- Gastroenterology  Hospital visits: None in previous 6 months  Total time for month: 22 min  Debbora Dus, PharmD Clinical Pharmacist [facility] [phone]

## 2021-02-03 ENCOUNTER — Other Ambulatory Visit: Payer: Self-pay

## 2021-02-03 ENCOUNTER — Ambulatory Visit: Payer: Self-pay

## 2021-02-03 ENCOUNTER — Encounter: Payer: Self-pay | Admitting: Physical Medicine and Rehabilitation

## 2021-02-03 ENCOUNTER — Ambulatory Visit (INDEPENDENT_AMBULATORY_CARE_PROVIDER_SITE_OTHER): Payer: Medicare Other | Admitting: Physical Medicine and Rehabilitation

## 2021-02-03 VITALS — BP 125/81 | HR 72

## 2021-02-03 DIAGNOSIS — M48062 Spinal stenosis, lumbar region with neurogenic claudication: Secondary | ICD-10-CM

## 2021-02-03 DIAGNOSIS — M5416 Radiculopathy, lumbar region: Secondary | ICD-10-CM

## 2021-02-03 DIAGNOSIS — D1779 Benign lipomatous neoplasm of other sites: Secondary | ICD-10-CM | POA: Diagnosis not present

## 2021-02-03 DIAGNOSIS — R202 Paresthesia of skin: Secondary | ICD-10-CM

## 2021-02-03 MED ORDER — METHYLPREDNISOLONE ACETATE 80 MG/ML IJ SUSP
80.0000 mg | Freq: Once | INTRAMUSCULAR | Status: AC
  Administered 2021-02-03: 80 mg

## 2021-02-03 NOTE — Progress Notes (Signed)
Pt state pain in both thigh that travels to his knees. Pt state most pain on the left side and it hard for hi to sleep at night. Pt state walking, standing and bending makes the pain worse. Pt state he takes pain meds to help ease his pain. Pt mention he request pain meds and using gabapentin makes him unsteady and falling. Pt state he fell four days ago in a store. Pt has hx of inj on 12/23/20 pt state it helped and give him 80% relief.  Numeric Pain Rating Scale and Functional Assessment Average Pain 7   In the last MONTH (on 0-10 scale) has pain interfered with the following?  1. General activity like being  able to carry out your everyday physical activities such as walking, climbing stairs, carrying groceries, or moving a chair?  Rating(8)   +Driver, -BT, -Dye Allergies.

## 2021-02-03 NOTE — Patient Instructions (Signed)

## 2021-02-04 ENCOUNTER — Encounter: Payer: Self-pay | Admitting: Physical Medicine and Rehabilitation

## 2021-02-04 NOTE — Progress Notes (Signed)
David [name] - 70 y.o. male MRN [id number]  Date of birth: Jan 09, 1951  Office Visit Note: Visit Date: 02/03/2021 PCP: [name], MD Referred by: [name], MD  Subjective: Chief Complaint  Patient presents with  . Left Thigh - Pain  . Right Thigh - Pain  . Right Knee - Pain  . Left Knee - Pain   HPI: Alby Schwabe is a 70 y.o. male who comes in today For follow-up evaluation and management of chronic worsening severe low back pain with referral to both legs to about his knees.  His pain is left more than right and is hard for him to sleep at night.  He gets worsening with walking standing and bending.  He does use some hydrocodone provided to him by Benita Stabile, P.A.-C for pain relief.  Originally referred through Dr. Jean Rosenthal.  Bilateral L3 transforaminal epidural steroid injection took a little while to work but did give him diagnostic relief more than 50% but it did not last and his pain has returned.  He reports current pain level 7 out of 10 and it does affect his daily living.  In the interim from the injection he wrote saying that his brother who has neuropathy was getting significant relief from gabapentin.  He did want [place] to try to start gabapentin for him and we did.  Unfortunately he says after getting up to dosing of 300 mg twice daily he felt weak in the legs and he had his legs just completely give out on him he felt unsteady.  He seems somewhat irritated at the fact that this happened and he asked why it happened and I do not really have a good answer because of not totally sure it was the gabapentin that did it on this one-time event and is really uncommon to have it be that the legs would give out with gabapentin.  Unsteadiness can definitely happen.  We had a long discussion about this today and we can discontinue the gabapentin.  He is disappointed that it did not help.  His case is very complicated with a history of uncontrolled diabetes.  The last  hemoglobin A1c I have on record was in January 2021 which was close to 9 and he routinely is in the 9-10 range.  He has had complications with coronary artery disease with multiple procedures for his heart.  He also has a history of alcoholic cirrhosis of the liver.  All of these could cause peripheral polyneuropathy as well but he does not carry that diagnosis.  There is no electrodiagnostic studies that I can see.  Nonetheless he did get diagnostic relief close to 80% relief short-term with bilateral L3 injections.  His MRI shows multilevel severe stenosis due to multifactorial reasons including epidural lipomatosis.  Review of Systems  Musculoskeletal: Positive for back pain and joint pain.  Neurological: Positive for tingling and weakness.  All other systems reviewed and are negative.  Otherwise per HPI.  Assessment & Plan: Visit Diagnoses:    ICD-10-CM   1. Lumbar radiculopathy  M54.16 XR C-ARM NO REPORT    Epidural Steroid injection    methylPREDNISolone acetate (DEPO-MEDROL) injection 80 mg  2. Spinal stenosis of lumbar region with neurogenic claudication  M48.062   3. 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 Paresthesia of skin  R20.2      Plan: Findings:  Chronic worsening severe low back and bilateral radicular type claudication symptoms into the thighs and a complicated patient with  uncontrolled diabetes insulin-dependent with history of alcoholic cirrhosis of the liver.  No history of polyneuropathy but definitely set up to have this.  Epidural lipomatosis likely as a result to the use of insulin for his diabetes.  We do see epidural lipomatosis and insulin-dependent diabetics even despite if their weight is not obese.  I think trying an L4 transforaminal injection diagnostically and therapeutically is wise to see if that gives him any more relief than the other level.  If he is not getting any long-term relief and now really cannot take the gabapentin as noted in the history of present  illness he likely would need to speak with a spine surgeon or neurosurgeon for possible decompression but he does have multiple medical factors that might make that difficult.  He may be a candidate for chronic pain management as well.  Bilateral L4 transforaminal injection completed today.    Meds & Orders:  Meds ordered this encounter  Medications  . methylPREDNISolone acetate (DEPO-MEDROL) injection 80 mg    Orders Placed This Encounter  Procedures  . XR C-ARM NO REPORT  . Epidural Steroid injection    Follow-up: Return for visit to requesting physician as needed.   Procedures: No procedures performed  Lumbosacral Transforaminal Epidural Steroid Injection - Sub-Pedicular Approach with Fluoroscopic Guidance  Patient: Navarro Nine      Date of Birth: [birth year] MRN: [id number] PCP: [name], MD      Visit Date: 02/03/2021   Universal Protocol:    Date/Time: 02/03/2021  Consent Given By: the patient  Position: PRONE  Additional Comments: Vital signs were monitored before and after the procedure. Patient was prepped and draped in the usual sterile fashion. The correct patient, procedure, and site was verified.   Injection Procedure Details:   Procedure diagnoses: Lumbar radiculopathy [M54.16]    Meds Administered:  Meds ordered this encounter  Medications  . methylPREDNISolone acetate (DEPO-MEDROL) injection 80 mg    Laterality: Bilateral  Location/Site:  L4-L5  Needle:5.0 in., 22 ga.  Short bevel or Quincke spinal needle  Needle Placement: Transforaminal  Findings:    -Comments: Excellent flow of contrast along the nerve, nerve root and into the epidural space.  Procedure Details: After squaring off the end-plates to get a true AP view, the C-arm was positioned so that an oblique view of the foramen as noted above was visualized. The target area is just inferior to the "nose of the scotty dog" or sub pedicular. The soft tissues overlying this  structure were infiltrated with 2-3 ml. of 1% Lidocaine without Epinephrine.  The spinal needle was inserted toward the target using a "trajectory" view along the fluoroscope beam.  Under AP and lateral visualization, the needle was advanced so it did not puncture dura and was located close the 6 O'Clock position of the pedical in AP tracterory. Biplanar projections were used to confirm position. Aspiration was confirmed to be negative for CSF and/or blood. A 1-2 ml. volume of Isovue-250 was injected and flow of contrast was noted at each level. Radiographs were obtained for documentation purposes.   After attaining the desired flow of contrast documented above, a 0.5 to 1.0 ml test dose of 0.25% Marcaine was injected into each respective transforaminal space.  The patient was observed for 90 seconds post injection.  After no sensory deficits were reported, and normal lower extremity motor function was noted,   the above injectate was administered so that equal amounts of the injectate were placed at each foramen (level)  into the transforaminal epidural space.   Additional Comments:  The patient tolerated the procedure well Dressing: 2 x 2 sterile gauze and Band-Aid    Post-procedure details: Patient was observed during the procedure. Post-procedure instructions were reviewed.  Patient left the clinic in stable condition.      Clinical History: MRI LUMBAR SPINE WITHOUT CONTRAST  TECHNIQUE: Multiplanar, multisequence MR imaging of the lumbar spine was performed. No intravenous contrast was administered.  COMPARISON:  None.  FINDINGS: Segmentation: 5 lumbar type vertebrae based on the available coverage  Alignment: Reversal of lumbar lordosis. Dextroscoliosis. T11-12 anterolisthesis at L1-2, L2-3 retrolisthesis.  Vertebrae:  No fracture, evidence of discitis, or bone lesion.  Conus medullaris and cauda equina: Conus extends to the L1 level. Conus appears normal. There is  cauda equina redundancy due to the degree of severe spinal stenosis.  Paraspinal and other soft tissues: Fatty infiltration of intrinsic back muscles.  Disc levels:  Lumbar pedicles are diffusely short and there is epidural lipomatosis from L2-S1.  T11-12: Facet spurring and ligamentum flavum thickening. There is anterolisthesis and disc bulging. Spinal stenosis with cord flattening. This level is not covered on axial slices; no definite cord edema.  T12- L1: Unremarkable.  L1-L2: Disc narrowing and bulging with endplate ridging. No neural compression.  L2-L3: Disc narrowing and bulging broad central protrusion. Advanced thecal sac compression. Moderate left foraminal stenosis.  L3-L4: Disc narrowing and endplate degeneration with left eccentric bulging. Asymmetric left facet and endplate spurring. Complete thecal sac effacement of CSF. Advanced left foraminal stenosis  L4-L5: Disc narrowing and bulging with broad central protrusion. Degenerative facet spurring on both sides. Complete thecal sac effacement of CSF. Right more than left foraminal stenosis, moderate to advanced on the right.  L5-S1:Disc narrowing with right eccentric endplate degeneration and disc bulging. Mild facet spurring. Complete thecal sac effacement. Moderate right foraminal stenosis.  IMPRESSION: 1. Diffuse degenerative disease with scoliosis and multilevel listhesis. 2. Degenerative disease, epidural lipomatosis, and short pedicles causes advanced thecal sac effacement from L2-3 to L5-S1. 3. 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 4. Foraminal impingement most notably on the left at L3-4 and right at L4-5, L5-S1.   Electronically Signed   By: Monte Fantasia M.D.   On: 11/17/2020 04:57   He reports that he quit smoking about 37 years ago. His smoking use included cigarettes. He has never used smokeless tobacco.  Recent  Labs    03/18/20 1115 07/28/20 1100 11/03/20 1124  HGBA1C 7.3* 7.5* 7.5*    Objective:  VS:  HT:    WT:   BMI:     BP:125/81  HR:72bpm  TEMP: ( )  RESP:  Physical Exam Vitals and nursing note reviewed.  Constitutional:      General: He is not in acute distress.    Appearance: Normal appearance. He is not ill-appearing.  HENT:     Head: Normocephalic and atraumatic.     Right Ear: External ear normal.     Left Ear: External ear normal.     Nose: No congestion.  Eyes:     Extraocular Movements: Extraocular movements intact.  Cardiovascular:     Rate and Rhythm: Normal rate.     Pulses: Normal pulses.  Pulmonary:     Effort: Pulmonary effort is normal. No respiratory distress.  Abdominal:     General: There is no distension.     Palpations: Abdomen is soft.  Musculoskeletal:  General: No tenderness or signs of injury.     Cervical back: Neck supple.     Right lower leg: No edema.     Left lower leg: No edema.     Comments: Patient has good distal strength without clonus.  Some pain with extension and facet loading of the lumbar spine.  No pain with hip rotation.  He has good sensation to light touch with some dysesthesia in the L4 distribution.  Skin:    Findings: No erythema or rash.  Neurological:     General: No focal deficit present.     Mental Status: He is alert and oriented to person, place, and time.     Sensory: No sensory deficit.     Motor: No weakness or abnormal muscle tone.     Coordination: Coordination normal.  Psychiatric:        Mood and Affect: Mood normal.        Behavior: Behavior normal.     Ortho Exam  Imaging: XR C-ARM NO REPORT  Result Date: 02/03/2021 Please see Notes tab for imaging impression.   Past Medical/Family/Surgical/Social History: Medications & Allergies reviewed per EMR, new medications updated. Patient Active Problem List   Diagnosis Date Noted  . BPH associated with nocturia 06/10/2020  . 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 Hip pain 08/07/2019  . History of esophageal varices 09/15/2018  . Alcoholic cirrhosis of liver with ascites ([place])   . Secondary esophageal varices without bleeding ([place])   . Anemia 08/31/2018  . Overweight 09/13/2017  . Poorly controlled type 2 diabetes mellitus with circulatory disorder ([place]) 01/18/2017  . Medial epicondylitis of elbow, left 08/03/2016  . Elevated liver function tests 07/13/2016  . Thrombocytopenia ([place]) 07/13/2016  . Trigger middle finger of left hand 03/02/2016  . Bilateral hand pain 12/02/2015  . Primary osteoarthritis of both first carpometacarpal joints 12/02/2015  . Advance directive discussed with patient 05/16/2015  . Routine general medical examination at a health care facility 02/16/2011  . Erectile dysfunction 02/16/2011  . Essential hypertension 12/21/2010  . Hyperlipidemia LDL goal <70 06/24/2009  . Osteoarthritis, multiple sites 04/16/2009  . CAD (coronary artery disease) 02/04/2009  . Major depressive disorder, recurrent episode ([place]) 08/21/2008  . ALLERGIC RHINITIS 08/21/2008  . Sleep apnea 08/21/2008   Past Medical History:  Diagnosis Date  . Allergy   . Anemia   . Arthritis   . CAD (coronary artery disease)    s/p bifurcation stenting (DES x2) and stenting bifurcation lesion distal RCA (1 DES)  . Cataract   . Cirrhosis ([place])   . Depression   . Diabetes mellitus   . Esophageal varices ([place])   . GERD (gastroesophageal reflux disease)   . Hyperlipemia   . Hypertension   . Myocardial infarction ([place])   . Sleep apnea    CPAP   Family History  Problem Relation Age of Onset  . Heart failure Father        Deceased 36 y/o  . Diabetes Father   . Cancer Father        Prostate  . Heart failure Mother        Deceased 40 y/o  . Alcohol abuse Mother   . Schizophrenia Son        Paranoid  . Uveitis Son   . Cancer Brother        prostate  . Colon cancer Neg Hx   . Esophageal cancer Neg Hx   .  Stomach cancer  Neg Hx   . Liver cancer Neg Hx   . Pancreatic cancer Neg Hx   . Rectal cancer Neg Hx    Past Surgical History:  Procedure Laterality Date  . BIOPSY  09/01/2018   Procedure: BIOPSY;  Surgeon: Mauri Pole, MD;  Location: [facility];  Service: Endoscopy;;  . CATARACT EXTRACTION Bilateral   . CORONARY ANGIOPLASTY WITH STENT PLACEMENT   March 2010   Dr. Olevia Perches  . ESOPHAGOGASTRODUODENOSCOPY (EGD) WITH PROPOFOL N/A 09/01/2018   Procedure: ESOPHAGOGASTRODUODENOSCOPY (EGD) WITH PROPOFOL;  Surgeon: Mauri Pole, MD;  Location: [facility];  Service: Endoscopy;  Laterality: N/A;  . LEFT HEART CATH AND CORONARY ANGIOGRAPHY N/A 08/31/2018   Procedure: LEFT HEART CATH AND CORONARY ANGIOGRAPHY;  Surgeon: Sherren Mocha, MD;  Location: [facility];  Service: Cardiovascular;  Laterality: N/A;  . TONSILLECTOMY  1959   Social History   Occupational History  . Occupation: Patent examiner buildings    Comment: site closed  . Occupation: Engineering geologist    Comment: part time  Tobacco Use  . Smoking status: Former Smoker    Types: Cigarettes    Quit date: 11/30/1983    Years since quitting: 37.2  . Smokeless tobacco: Never Used  Vaping Use  . Vaping Use: Never used  Substance and Sexual Activity  . Alcohol use: Yes    Alcohol/week: 20.0 standard drinks    Types: 20 Cans of beer per week  . Drug use: No  . Sexual activity: Not Currently

## 2021-02-04 NOTE — Procedures (Signed)
Lumbosacral Transforaminal Epidural Steroid Injection - Sub-Pedicular Approach with Fluoroscopic Guidance  Patient: Nation Cradle      Date of Birth: 10/29/1951 MRN: [id number] PCP: [name], MD      Visit Date: 02/03/2021   Universal Protocol:    Date/Time: 02/03/2021  Consent Given By: the patient  Position: PRONE  Additional Comments: Vital signs were monitored before and after the procedure. Patient was prepped and draped in the usual sterile fashion. The correct patient, procedure, and site was verified.   Injection Procedure Details:   Procedure diagnoses: Lumbar radiculopathy [M54.16]    Meds Administered:  Meds ordered this encounter  Medications  . methylPREDNISolone acetate (DEPO-MEDROL) injection 80 mg    Laterality: Bilateral  Location/Site:  L4-L5  Needle:5.0 in., 22 ga.  Short bevel or Quincke spinal needle  Needle Placement: Transforaminal  Findings:    -Comments: Excellent flow of contrast along the nerve, nerve root and into the epidural space.  Procedure Details: After squaring off the end-plates to get a true AP view, the C-arm was positioned so that an oblique view of the foramen as noted above was visualized. The target area is just inferior to the "nose of the scotty dog" or sub pedicular. The soft tissues overlying this structure were infiltrated with 2-3 ml. of 1% Lidocaine without Epinephrine.  The spinal needle was inserted toward the target using a "trajectory" view along the fluoroscope beam.  Under AP and lateral visualization, the needle was advanced so it did not puncture dura and was located close the 6 O'Clock position of the pedical in AP tracterory. Biplanar projections were used to confirm position. Aspiration was confirmed to be negative for CSF and/or blood. A 1-2 ml. volume of Isovue-250 was injected and flow of contrast was noted at each level. Radiographs were obtained for documentation purposes.   After attaining the  desired flow of contrast documented above, a 0.5 to 1.0 ml test dose of 0.25% Marcaine was injected into each respective transforaminal space.  The patient was observed for 90 seconds post injection.  After no sensory deficits were reported, and normal lower extremity motor function was noted,   the above injectate was administered so that equal amounts of the injectate were placed at each foramen (level) into the transforaminal epidural space.   Additional Comments:  The patient tolerated the procedure well Dressing: 2 x 2 sterile gauze and Band-Aid    Post-procedure details: Patient was observed during the procedure. Post-procedure instructions were reviewed.  Patient left the clinic in stable condition.

## 2021-02-11 ENCOUNTER — Other Ambulatory Visit: Payer: Self-pay | Admitting: Internal Medicine

## 2021-02-11 ENCOUNTER — Encounter: Payer: Self-pay | Admitting: Physical Medicine and Rehabilitation

## 2021-02-13 ENCOUNTER — Telehealth: Payer: Self-pay

## 2021-02-13 NOTE — Chronic Care Management (AMB) (Addendum)
Chronic Care Management Pharmacy Assistant   Name: David [name]  MRN: [id number] DOB: 1951/06/06    Reason for Encounter: Disease State- HTN and DM   Conditions to be addressed/monitored: HTN and DMII    Recent office visits:  None since last CCM contact  Recent consult visits:  02/03/2021 [place]  [facility] visits:  None in previous 6 months  Medications: Outpatient Encounter Medications as of 02/13/2021  Medication Sig Note   ALPHA LIPOIC ACID PO Take 1 tablet by mouth daily.    Ascorbic Acid (VITAMIN C) 1000 MG tablet Take 1,000 mg by mouth daily.    b complex vitamins tablet Take 1 tablet by mouth daily.    Cholecalciferol (D3 VITAMIN PO) Take 1 tablet by mouth daily.    Coenzyme Q10 (CO Q 10) 100 MG CAPS Take 1 capsule by mouth daily.    ferrous sulfate 325 (65 FE) MG tablet Take 325 mg by mouth daily with breakfast.    furosemide (LASIX) 20 MG tablet Take 1 tablet by mouth once daily    gabapentin (NEURONTIN) 600 MG tablet Take 0.5 tablets (300 mg total) by mouth at bedtime for 7 days, THEN 0.5 tablets (300 mg total) 2 (two) times daily for 7 days, THEN 0.5 tablets (300 mg total) 3 (three) times daily for 7 days. Then 3 times per day.[name]    glucose blood (ONE TOUCH ULTRA TEST) test strip Use to test blood sugar 2 times daily as instructed. Dx: E11.59, E11.65    HYDROcodone-acetaminophen (NORCO) 5-325 MG tablet Take 1 tablet by mouth every 6 (six) hours as needed for moderate pain. 12/30/2020: Not currently taking   insulin lispro (HUMALOG KWIKPEN) 200 UNIT/ML KwikPen INJECT 14-16 UNITS INTO THE SKIN THREE TIMES DAILY BEFORE MEALS    insulin NPH Human (NOVOLIN N RELION) 100 UNIT/ML injection Inject under skin 30 units in a.m. and 35-40 units at bedtime    Insulin Pen Needle 32G X 4 MM MISC Use 4x a day - with insulin    Insulin Syringe-Needle U-100 (RELION INSULIN SYRINGE) 31G X 15/64" 0.5 ML MISC USE  TWICE DAILY    losartan (COZAAR) 50 MG tablet Take 75  mg by mouth daily. Takes 1 and 1/2 tablet qd (total 75mg )    metFORMIN (GLUCOPHAGE) 1000 MG tablet TAKE 1 TABLET BY MOUTH TWICE DAILY WITH MEALS    Methylsulfonylmethane (MSM PO) Take 1 tablet by mouth daily.    Multiple Vitamin (MULTIVITAMIN WITH MINERALS) TABS tablet Take 1 tablet by mouth at bedtime.    ONETOUCH DELICA LANCETS FINE MISC Use to test blood sugar 2 times daily as instructed. Dx: E11.59, E11.65    pantoprazole (PROTONIX) 40 MG tablet Take 1 tablet by mouth twice daily    PARoxetine (PAXIL) 20 MG tablet Take 40 mg by mouth daily. Take 2 tablets qd (total 40mg )    propranolol (INDERAL) 80 MG tablet Take 1 tablet (80 mg total) by mouth 2 (two) times daily.    rosuvastatin (CRESTOR) 10 MG tablet Take 1 tablet by mouth once daily    Semaglutide,0.25 or 0.5MG /DOS, (OZEMPIC, 0.25 OR 0.5 MG/DOSE,) 2 MG/1.5ML SOPN Inject 0.5 mg into the skin once a week.    sildenafil (VIAGRA) 100 MG tablet TAKE 1 TABLET BY MOUTH ONCE DAILY AS NEEDED FOR ERECTILE DYSFUNCTION    spironolactone (ALDACTONE) 50 MG tablet Take 1 tablet by mouth once daily    triamcinolone (KENALOG) 0.1 % Apply 1 application topically 2 (two) times daily  as needed. Apply to scalp    No facility-administered encounter medications on file as of 02/13/2021.     Recent Relevant Labs: Lab Results  Component Value Date/Time   HGBA1C 7.5 (A) 11/03/2020 11:24 AM   HGBA1C 7.5 (A) 07/28/2020 11:00 AM   HGBA1C 8.9 (H) 12/11/2019 11:05 AM   HGBA1C 7.0 (H) 08/28/2018 06:45 PM   MICROALBUR 9.1 (H) 02/11/2010 10:11 AM   MICROALBUR 1.8 04/16/2009 09:52 AM    Kidney Function Lab Results  Component Value Date/Time   CREATININE 1.21 12/30/2020 11:43 AM   CREATININE 1.20 06/23/2020 02:50 PM   CREATININE 0.99 09/13/2017 10:40 AM   CREATININE 0.97 06/09/2016 08:36 AM   GFR 60.12 12/30/2020 11:43 AM   GFRNONAA >60 08/31/2018 06:26 AM   GFRNONAA 78 09/13/2017 10:40 AM   GFRAA >60 08/31/2018 06:26 AM   GFRAA 90 09/13/2017 10:40 AM    Attempted to contact patient 02/13/21, 02/17/21 and 02/20/21. Unable to reach patient. Left multiple messages to return call. Unsuccessful outreach.   Current antihyperglycemic regimen:  Humalog Kwikpen 200 unint/ml-INJECT 14-16 UNITS INTO THE SKIN THREE TIMES DAILY BEFORE MEALS Novolin N Relion 100 unil/ml Inject under skin 30 units in a.m. and 35-40 units at bedtime Metformin 1000mg TAKE 1 TABLET BY MOUTH TWICE DAILY WITH MEALS Ozempic 0.5 mg.Inject 0.5 mg into the skin once a week.   Have there been any recent hospitalizations or ED visits since last visit with CPP? No    Adherence Review: Is the patient currently on a STATIN medication? Yes Is the patient currently on ACE/ARB medication? Yes Does the patient have >5 day gap between last estimated fill dates? CPP to review  Reviewed chart prior to disease state call. Spoke with patient regarding BP  Recent Office Vitals: BP Readings from Last 3 Encounters:  02/03/21 125/81  01/13/21 (!) 98/56  12/30/20 124/72   Pulse Readings from Last 3 Encounters:  02/03/21 72  01/13/21 66  12/30/20 84    Wt Readings from Last 3 Encounters:  12/30/20 190 lb 12.8 oz (86.5 kg)  11/03/20 190 lb (86.2 kg)  09/22/20 187 lb 3.2 oz (84.9 kg)     Kidney Function Lab Results  Component Value Date/Time   CREATININE 1.21 12/30/2020 11:43 AM   CREATININE 1.20 06/23/2020 02:50 PM   CREATININE 0.99 09/13/2017 10:40 AM   CREATININE 0.97 06/09/2016 08:36 AM   GFR 60.12 12/30/2020 11:43 AM   GFRNONAA >60 08/31/2018 06:26 AM   GFRNONAA 78 09/13/2017 10:40 AM   GFRAA >60 08/31/2018 06:26 AM   GFRAA 90 09/13/2017 10:40 AM    BMP Latest Ref Rng & Units 12/30/2020 06/23/2020 06/10/2020  Glucose 70 - 99 mg/dL 253(H) 162(H) 219(H)  BUN 6 - 23 mg/dL 21 25(H) 34(H)  Creatinine 0.40 - 1.50 mg/dL 1.21 1.20 1.19  BUN/Creat Ratio 6 - 22 (calc) - - -  Sodium 135 - 145 mEq/L 136 137 134(L)  Potassium 3.5 - 5.1 mEq/L 4.5 4.8 5.2(H)  Chloride 96 - 112 mEq/L  104 108 104  CO2 19 - 32 mEq/L 26 23 23   Calcium 8.4 - 10.5 mg/dL 10.4 10.4 10.0     Current antihypertensive regimen:  Furosemide 20 mg Take 1 tablet by mouth once daily Losartan 50 mg.Take 75 mg by mouth daily. Takes 1 and 1/2 tablet qd (total 75mg ) Propanolol 80 mg.Take 1 tablet (80 mg total) by mouth 2 (two) times daily. Spironolactone 50 mg - 1 tablet daily  (per GI for swelling)   Any  recent hospitalizations or ED visits since last visit with CPP? No    Adherence Review: Is the patient currently on ACE/ARB medication? Yes Does the patient have >5 day gap between last estimated fill dates? Losartan past due   Star Rating Drugs: Losartan 50 mg 11/08/2020  90 DS Metformin 1000 mg 12/08/2020  90 DS Rosuvastatin 10 mg 12/06/2020  90 DS   Follow-Up:  Pharmacist Review  Debbora Dus, CPP notified  David [name], [place] Assistant [phone]   I have reviewed the care management and care coordination activities outlined in this encounter and I am certifying that I agree with the content of this note. No further action required.  Debbora Dus, PharmD Clinical Pharmacist [facility] [phone]

## 2021-02-16 ENCOUNTER — Other Ambulatory Visit: Payer: Self-pay | Admitting: Physician Assistant

## 2021-02-16 MED ORDER — HYDROCODONE-ACETAMINOPHEN 5-325 MG PO TABS
1.0000 | ORAL_TABLET | Freq: Four times a day (QID) | ORAL | 0 refills | Status: DC | PRN
Start: 2021-02-16 — End: 2021-05-22

## 2021-02-17 ENCOUNTER — Other Ambulatory Visit: Payer: Self-pay

## 2021-02-17 ENCOUNTER — Encounter: Payer: Self-pay | Admitting: Internal Medicine

## 2021-02-17 ENCOUNTER — Ambulatory Visit: Payer: Medicare Other | Admitting: Internal Medicine

## 2021-02-17 VITALS — BP 128/82 | HR 80 | Ht 69.0 in | Wt 175.4 lb

## 2021-02-17 DIAGNOSIS — E1159 Type 2 diabetes mellitus with other circulatory complications: Secondary | ICD-10-CM | POA: Diagnosis not present

## 2021-02-17 DIAGNOSIS — E785 Hyperlipidemia, unspecified: Secondary | ICD-10-CM

## 2021-02-17 DIAGNOSIS — E663 Overweight: Secondary | ICD-10-CM

## 2021-02-17 DIAGNOSIS — E1165 Type 2 diabetes mellitus with hyperglycemia: Secondary | ICD-10-CM

## 2021-02-17 LAB — POCT GLYCOSYLATED HEMOGLOBIN (HGB A1C): Hemoglobin A1C: 6.1 % — AB (ref 4.0–5.6)

## 2021-02-17 LAB — POCT GLUCOSE (DEVICE FOR HOME USE): Glucose Fasting, POC: 129 mg/dL — AB (ref 70–99)

## 2021-02-17 NOTE — Patient Instructions (Addendum)
Please continue: - Metformin 1000 mg 2x a day, with meals - Ozempic 0.5 mg weekly - NPH 30 units in am and 40 units at bedtime - Humalog 14-16 units 15 min before each meal  Try to change your glucometer.  Please return in 3-4 months with your sugar log.

## 2021-02-17 NOTE — Progress Notes (Signed)
Patient ID: David [name], male   DOB: 07/20/1951, 70 y.o.   MRN: [id number]   This visit occurred during the SARS-CoV-2 public health emergency.  Safety protocols were in place, including screening questions prior to the visit, additional usage of staff PPE, and extensive cleaning of exam room while observing appropriate contact time as indicated for disinfecting solutions.   HPI: David [name] is a 70 y.o.-year-old male, returning for f/u for DM2, dx in ~2008, insulin-dependent since 09/2015, uncontrolled, with complications (CAD - s/p stents in 2013, PN). Last visit 3 months ago.  Interim history: Last year he and his wife of 40 years separated and he had a difficult time in 2021 living outside his home. Also earlier last year he was diagnosed with cirrhosis.  He does have a history of heavy alcohol use. He has anemia and decreased platelets. He was on iron >> ran out. He reduced alcohol to 2 days a week now. He has back pain and had 2 steroid inj in lower back - last was last week. He changed his diet since last visit, reduced fast food.  He lost 15 pounds.  Reviewed HbA1c levels: Lab Results  Component Value Date   HGBA1C 7.5 (A) 11/03/2020   HGBA1C 7.5 (A) 07/28/2020   HGBA1C 7.3 (A) 03/18/2020  03/05/2016: HbA1c 9.1%  He is on: - Metformin 1000 mg 2x a day, with meals - Lantus 40 >> 48 units at bedtime >> NPH 30 units in a.m. and  (25-30 units) units at night >> 30 units in a.m. and 40 units at night - Humalog 8-12 >> 16 >> 14-20 >> 16-20 >> 14-16 units before a meal - Ozempic 0.5 >> 1 >> 0.5 mg weekly (due to cost) He was on Ozempic from 01/2019 to 08/2019 but had to come off due to price.  We restarted this in 01/2020. He was on Bydureon 2 mg weekly - started 01/30/2017 >> stopped 04/2017 b/c price - could not restart  Pt checks his sugars twice a day per review of his log: - am: 115, 119, 177-255, 285 >> 115, 174-282 >> 108, 125-234, 256 - 2h after b'fast: n/c  - before lunch:  85-187, 201 >> 158, 327 >> n/c >> n/c - 2h after lunch: n/c - before dinner:  125-253 >> 136, 148-222, 267, 295 >> 113-258 - 2h after dinner: n/c - bedtime:  126-209, 225-240 (candy), 261 >> 160-215 >> n/c - nighttime: n/c Lowest sugar was 79 >> 90 >> 115 >> 115 >> 108; it is unclear at which level he has hypoglycemia awareness. Highest sugar was 285 >> 295 >> 287.  Glucometer: One Touch  Pt's meals are: - Breakfast: protein shake - Lunch: n/a - Dinner: meat + veggies /potatoes or salad - Snacks: no Diet sodas, ice tea with splenda.  No regular sodas.  -+ CKD, last BUN/creatinine:  Lab Results  Component Value Date   BUN 21 12/30/2020   CREATININE 1.21 12/30/2020  On losartan. -+ HL; last set of lipids: Lab Results  Component Value Date   CHOL 112 12/11/2019   HDL 46.60 12/11/2019   LDLCALC 52 12/11/2019   LDLDIRECT 103.9 11/05/2013   TRIG 65.0 12/11/2019   CHOLHDL 2 12/11/2019  On Crestor 10. - last eye exam was in 11/2019: No DR.  Has previous history of cataract surgery. -he has numbness and tingling in his feet  He also has HTN, depression.  He has a history of anabolic steroid use for muscle building.  He  had a cardiac cath 08/2018 for presyncope: Stable coronary anatomy without high-grade coronary stenosis.  He is on medical therapy for CAD.  He was found to be anemic (Hb 8.1) >> EGD report reviewed:  - Non-bleeding grade II esophageal varices. - Non-bleeding gastric ulcers with a clean ulcer base (Forrest Class III). Biopsied. - Erythematous duodenopathy. - Normal second portion of the duodenum.  He was on iron >> ran out 3 weeks ago.  ROS: Constitutional: no weight gain/+ weight loss, no fatigue, no subjective hyperthermia, no subjective hypothermia Eyes: no blurry vision, no xerophthalmia ENT: no sore throat, no nodules palpated in neck, no dysphagia, no odynophagia, no hoarseness Cardiovascular: no CP/no SOB/no palpitations/no leg swelling Respiratory:  no cough/no SOB/no wheezing Gastrointestinal: no N/no V/no D/no C/no acid reflux Musculoskeletal: no muscle aches/no joint aches Skin: no rashes, no hair loss Neurological: + tremors/+ numbness/+ tingling/no dizziness  I reviewed pt's medications, allergies, PMH, social hx, family hx, and changes were documented in the history of present illness. Otherwise, unchanged from my initial visit note.    Past Medical History:  Diagnosis Date  . Allergy   . Anemia   . Arthritis   . CAD (coronary artery disease)    s/p bifurcation stenting (DES x2) and stenting bifurcation lesion distal RCA (1 DES)  . Cataract   . Cirrhosis ([place])   . Depression   . Diabetes mellitus   . Esophageal varices ([place])   . GERD (gastroesophageal reflux disease)   . Hyperlipemia   . Hypertension   . Myocardial infarction ([place])   . Sleep apnea    CPAP   Past Surgical History:  Procedure Laterality Date  . BIOPSY  09/01/2018   Procedure: BIOPSY;  Surgeon: Mauri Pole, MD;  Location: [facility];  Service: Endoscopy;;  . CATARACT EXTRACTION Bilateral   . CORONARY ANGIOPLASTY WITH STENT PLACEMENT   March 2010   Dr. Olevia Perches  . ESOPHAGOGASTRODUODENOSCOPY (EGD) WITH PROPOFOL N/A 09/01/2018   Procedure: ESOPHAGOGASTRODUODENOSCOPY (EGD) WITH PROPOFOL;  Surgeon: Mauri Pole, MD;  Location: [facility];  Service: Endoscopy;  Laterality: N/A;  . LEFT HEART CATH AND CORONARY ANGIOGRAPHY N/A 08/31/2018   Procedure: LEFT HEART CATH AND CORONARY ANGIOGRAPHY;  Surgeon: Sherren Mocha, MD;  Location: [facility];  Service: Cardiovascular;  Laterality: N/A;  . TONSILLECTOMY  1959   Social History   Social History  . Marital Status: Married    Spouse Name: N/A  . Number of Children: 2   Occupational History  . 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 Smoking status: Former Smoker    Types: Cigarettes    Quit date: 11/30/1983  .  Smokeless tobacco: Never Used  . Alcohol Use:  beer, 3-4 times a week, 6-8 drinks at the time       . Drug Use: No   Social History Narrative   No living will   Would want wife, then son David [name], as health care POA   Would accept resuscitation   Not sure about tube feeds   Current Outpatient Medications on File Prior to Visit  Medication Sig Dispense Refill  . ALPHA LIPOIC ACID PO Take 1 tablet by mouth daily.    . Ascorbic Acid (VITAMIN C) 1000 MG tablet Take 1,000 mg by mouth daily.    [name] b complex vitamins tablet Take 1 tablet by mouth daily.    . Cholecalciferol (D3 VITAMIN  PO) Take 1 tablet by mouth daily.    . Coenzyme Q10 (CO Q 10) 100 MG CAPS Take 1 capsule by mouth daily.    . ferrous sulfate 325 (65 FE) MG tablet Take 325 mg by mouth daily with breakfast.    . furosemide (LASIX) 20 MG tablet Take 1 tablet by mouth once daily 90 tablet 1  . gabapentin (NEURONTIN) 600 MG tablet Take 0.5 tablets (300 mg total) by mouth at bedtime for 7 days, THEN 0.5 tablets (300 mg total) 2 (two) times daily for 7 days, THEN 0.5 tablets (300 mg total) 3 (three) times daily for 7 days. Then 3 times per day.. 90 tablet 0  . glucose blood (ONE TOUCH ULTRA TEST) test strip Use to test blood sugar 2 times daily as instructed. Dx: E11.59, E11.65 100 each 5  . HYDROcodone-acetaminophen (NORCO) 5-325 MG tablet Take 1 tablet by mouth every 6 (six) hours as needed for moderate pain. 20 tablet 0  . insulin lispro (HUMALOG KWIKPEN) 200 UNIT/ML KwikPen INJECT 14-16 UNITS INTO THE SKIN THREE TIMES DAILY BEFORE MEALS 30 mL 3  . insulin NPH Human (NOVOLIN N RELION) 100 UNIT/ML injection Inject under skin 30 units in a.m. and 35-40 units at bedtime 20 mL 11  . Insulin Pen Needle 32G X 4 MM MISC Use 4x a day - with insulin 200 each 3  . Insulin Syringe-Needle U-100 (RELION INSULIN SYRINGE) 31G X 15/64" 0.5 ML MISC USE  TWICE DAILY 100 each 5  . losartan (COZAAR) 50 MG tablet Take 75 mg by mouth daily. Takes 1 and 1/2  tablet qd (total 75mg )    . metFORMIN (GLUCOPHAGE) 1000 MG tablet TAKE 1 TABLET BY MOUTH TWICE DAILY WITH MEALS 180 tablet 1  . Methylsulfonylmethane (MSM PO) Take 1 tablet by mouth daily.    . Multiple Vitamin (MULTIVITAMIN WITH MINERALS) TABS tablet Take 1 tablet by mouth at bedtime.    Glory Rosebush DELICA LANCETS FINE MISC Use to test blood sugar 2 times daily as instructed. Dx: E11.59, E11.65 100 each 5  . pantoprazole (PROTONIX) 40 MG tablet Take 1 tablet by mouth twice daily 180 tablet 3  . PARoxetine (PAXIL) 20 MG tablet Take 40 mg by mouth daily. Take 2 tablets qd (total 40mg )    . propranolol (INDERAL) 80 MG tablet Take 1 tablet (80 mg total) by mouth 2 (two) times daily. 60 tablet 6  . rosuvastatin (CRESTOR) 10 MG tablet Take 1 tablet by mouth once daily 90 tablet 1  . Semaglutide,0.25 or 0.5MG /DOS, (OZEMPIC, 0.25 OR 0.5 MG/DOSE,) 2 MG/1.5ML SOPN Inject 0.5 mg into the skin once a week. 4.5 mL 3  . sildenafil (VIAGRA) 100 MG tablet TAKE 1 TABLET BY MOUTH ONCE DAILY AS NEEDED FOR ERECTILE DYSFUNCTION 10 tablet 1  . spironolactone (ALDACTONE) 50 MG tablet Take 1 tablet by mouth once daily 90 tablet 1  . triamcinolone (KENALOG) 0.1 % Apply 1 application topically 2 (two) times daily as needed. Apply to scalp     No current facility-administered medications on file prior to visit.   Allergies  Allergen Reactions  . Venlafaxine     Slowed his urine stream   Family History  Problem Relation Age of [place]  . Heart failure Father        Deceased 33 y/o  . Diabetes Father   . Cancer Father        Prostate  . Heart failure Mother        Deceased 58  y/o  . Alcohol abuse Mother   . Schizophrenia Son        Paranoid  . Uveitis Son   . Cancer Brother        prostate  . Colon cancer Neg Hx   . Esophageal cancer Neg Hx   . Stomach cancer Neg Hx   . Liver cancer Neg Hx   . Pancreatic cancer Neg Hx   . Rectal cancer Neg Hx    PE: BP 128/82 (BP Location: Right Arm, Patient Position:  Sitting, Cuff Size: Normal)   Pulse 80   Ht 5\' 9"  (1.753 m)   Wt 175 lb 6.4 oz (79.6 kg)   SpO2 97%   BMI 25.90 kg/m  Body mass index is 25.9 kg/m. Wt Readings from Last 3 Encounters:  02/17/21 175 lb 6.4 oz (79.6 kg)  12/30/20 190 lb 12.8 oz (86.5 kg)  11/03/20 190 lb (86.2 kg)   Constitutional: overweight, in NAD Eyes: PERRLA, EOMI, no exophthalmos ENT: moist mucous membranes, no thyromegaly, no cervical lymphadenopathy Cardiovascular: RRR, No MRG, + mild pitting edema in LE bilaterally Respiratory: CTA B Gastrointestinal: abdomen soft, NT, ND, BS+ Musculoskeletal: no deformities, strength intact in all 4 Skin: moist, warm, no rashes Neurological: + tremor with outstretched hands, DTR normal in all 4  ASSESSMENT: 1. DM2, insulin-dependent, uncontrolled, with complications - CAD - s/p 4 stents 2013 - seeing Dr Burt Knack - PN  Component     Latest Ref Rng 06/09/2016  C-Peptide     0.80-3.85 ng/mL 1.78  Glucose, Fasting     65 - 99 mg/dL 184 (H)  Glutamic Acid Decarb Ab     <5 IU/mL <5  Pancreatic Islet Cell Antibody     < 5 JDF Units <5   2. HL  3. Overweight  PLAN:  1. Patient with longstanding, uncontrolled, type 2 diabetes, with increased blood sugar variability.  He is on a complex medication regimen with Metformin, weekly GLP-1 receptor agonist, intermediate acting insulin and also rapid acting insulin.  At last visit, sugars are very high most of the time, and he admitted that he was still drinking alcohol despite the fact that this is greatly elevated his blood sugars.  He also admitted for not taking the Humalog but only approximately 2-3 times a week.  We discussed about the importance of taking this before every meal, especially before dinner.  We did not change the dose at that time, but tried to improve compliance.  HbA1c at last visit was stable, at 7.5%. -At today's visit, reviewing his sugar log, sugars are fluctuating with many values in the 200s.  However, we  checked his HbA1c: 6.1% (much better).  This morning, his blood sugar at home was 170.  We checked with our glucometer in the office [did not eat before the visit) and the CBG was 129.  I believe his meter is not accurate and I recommended that he gets another one.  I would not suggest to make changes in his regimen for now until we can get new blood sugars.  I advised him to call his insurance today and let me know which meter is covered so I can send a prescription to his pharmacy. - I suggested to:  Patient Instructions  Please continue: - Metformin 1000 mg 2x a day, with meals - Ozempic 0.5 mg weekly - NPH 30 units in am and 40 units at bedtime - Humalog 14-16 units 15 min before each meal  Try to change your  glucometer.  Please return in 3-4 months with your sugar log.   - advised to check sugars at different times of the day - 2-3x a day, rotating check times - advised for yearly eye exams >> he is UTD - return to clinic in 3-4 months     2. HL -Reviewed latest lipid panel from 11/2019: All fractions at goal Lab Results  Component Value Date   CHOL 112 12/11/2019   HDL 46.60 12/11/2019   LDLCALC 52 12/11/2019   LDLDIRECT 103.9 11/05/2013   TRIG 65.0 12/11/2019   CHOLHDL 2 12/11/2019  -Continues Crestor 10 without side effects -He is due for another lipid panel -will check it at next visit  3. Overweight -We were able to restart Ozempic a year ago after coming off due to insurance coverage.  However, this is still expensive. -At last visit we discussed about cutting out sweets and alcohol >> he cut down fatty foods and alcohol -Weight was stable at last visit, now lost 15 pounds  Philemon Kingdom, MD PhD [facility]

## 2021-02-17 NOTE — Addendum Note (Signed)
Addended by: Lauralyn Primes on: 02/17/2021 01:18 PM   Modules accepted: Orders

## 2021-02-22 ENCOUNTER — Encounter: Payer: Self-pay | Admitting: Internal Medicine

## 2021-03-16 ENCOUNTER — Encounter: Payer: Self-pay | Admitting: Orthopaedic Surgery

## 2021-03-21 ENCOUNTER — Other Ambulatory Visit: Payer: Self-pay | Admitting: Internal Medicine

## 2021-03-30 ENCOUNTER — Other Ambulatory Visit: Payer: Self-pay | Admitting: Gastroenterology

## 2021-04-02 ENCOUNTER — Telehealth: Payer: Self-pay

## 2021-04-02 NOTE — Telephone Encounter (Signed)
Dr Carlye Grippe called requesting you call her back in reference to pt and needing an MRI... Would not give any further information.Marland KitchenMarland Kitchen

## 2021-04-02 NOTE — Telephone Encounter (Signed)
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 She has dicussed this with Dr Annette Stable and he suggested a thoracic MRI and then a visit. Discussed that I didn't even order the first, and haven't seen him for this specific issue. I could see him and order the test if appropriate, but I am more likely to have problems getting insurance authorization than Dr Annette Stable (though I would still try if he comes in here).  Suggested they consider going straight to Dr Annette Stable and then having him order any appropriate tests.

## 2021-04-03 ENCOUNTER — Telehealth: Payer: Self-pay

## 2021-04-03 NOTE — Chronic Care Management (AMB) (Addendum)
Chronic Care Management Pharmacy Assistant   Name: David [name]  MRN: [id number] DOB: 07/08/1951  Reason for Encounter: Disease State    Conditions to be addressed/monitored: HTN and DMII  Recent office visits:  None since last CCM contact  Recent consult visits:  02/17/2021  Dr.Cristina Cruzita Lederer, Endocrinology - Continue Metformin 1000mg .twice daily with meals, Ozempic 0.5mg  weekly, NPH 30 units in am and 40 units at bedtine, continue Humalog 14-16 units 15 mins before each meal. Follow up 3-4 months.  Hospital visits:  None in previous 6 months  Medications: Outpatient Encounter Medications as of 04/03/2021  Medication Sig   ALPHA LIPOIC ACID PO Take 1 tablet by mouth daily.   Ascorbic Acid (VITAMIN C) 1000 MG tablet Take 1,000 mg by mouth daily.   b complex vitamins tablet Take 1 tablet by mouth daily.   Cholecalciferol (D3 VITAMIN PO) Take 1 tablet by mouth daily.   Coenzyme Q10 (CO Q 10) 100 MG CAPS Take 1 capsule by mouth daily.   ferrous sulfate 325 (65 FE) MG tablet Take 325 mg by mouth daily with breakfast.   furosemide (LASIX) 20 MG tablet Take 1 tablet by mouth once daily   gabapentin (NEURONTIN) 600 MG tablet Take 0.5 tablets (300 mg total) by mouth at bedtime for 7 days, THEN 0.5 tablets (300 mg total) 2 (two) times daily for 7 days, THEN 0.5 tablets (300 mg total) 3 (three) times daily for 7 days. Then 3 times per day.[name]   glucose blood (ONE TOUCH ULTRA TEST) test strip Use to test blood sugar 2 times daily as instructed. Dx: E11.59, E11.65   HYDROcodone-acetaminophen (NORCO) 5-325 MG tablet Take 1 tablet by mouth every 6 (six) hours as needed for moderate pain.   insulin lispro (HUMALOG KWIKPEN) 200 UNIT/ML KwikPen INJECT 14-16 UNITS INTO THE SKIN THREE TIMES DAILY BEFORE MEALS   insulin NPH Human (NOVOLIN N RELION) 100 UNIT/ML injection Inject under skin 30 units in a.m. and 35-40 units at bedtime   Insulin Pen Needle 32G X 4 MM MISC Use 4x a day - with insulin    Insulin Syringe-Needle U-100 (RELION INSULIN SYRINGE) 31G X 15/64" 0.5 ML MISC USE  TWICE DAILY   losartan (COZAAR) 50 MG tablet Take 75 mg by mouth daily. Takes 1 and 1/2 tablet qd (total 75mg )   metFORMIN (GLUCOPHAGE) 1000 MG tablet TAKE 1 TABLET BY MOUTH TWICE DAILY WITH MEALS   Methylsulfonylmethane (MSM PO) Take 1 tablet by mouth daily.   Multiple Vitamin (MULTIVITAMIN WITH MINERALS) TABS tablet Take 1 tablet by mouth at bedtime.   ONETOUCH DELICA LANCETS FINE MISC Use to test blood sugar 2 times daily as instructed. Dx: E11.59, E11.65   pantoprazole (PROTONIX) 40 MG tablet Take 1 tablet by mouth twice daily   PARoxetine (PAXIL) 20 MG tablet Take 2 tablets (40 mg total) by mouth daily. NEEDS OFFICE VISIT   propranolol (INDERAL) 80 MG tablet Take 1 tablet (80 mg total) by mouth 2 (two) times daily.   rosuvastatin (CRESTOR) 10 MG tablet Take 1 tablet by mouth once daily   Semaglutide,0.25 or 0.5MG /DOS, (OZEMPIC, 0.25 OR 0.5 MG/DOSE,) 2 MG/1.5ML SOPN Inject 0.5 mg into the skin once a week.   sildenafil (VIAGRA) 100 MG tablet TAKE 1 TABLET BY MOUTH ONCE DAILY AS NEEDED FOR ERECTILE DYSFUNCTION   spironolactone (ALDACTONE) 50 MG tablet Take 1 tablet by mouth once daily   triamcinolone (KENALOG) 0.1 % Apply 1 application topically 2 (two) times daily as needed. Apply  to scalp   No facility-administered encounter medications on file as of 04/03/2021.   Recent Office Vitals: BP Readings from Last 3 Encounters:  02/17/21 128/82  02/03/21 125/81  01/13/21 (!) 98/56   Pulse Readings from Last 3 Encounters:  02/17/21 80  02/03/21 72  01/13/21 66    Wt Readings from Last 3 Encounters:  02/17/21 175 lb 6.4 oz (79.6 kg)  12/30/20 190 lb 12.8 oz (86.5 kg)  11/03/20 190 lb (86.2 kg)     Kidney Function Lab Results  Component Value Date/Time   CREATININE 1.21 12/30/2020 11:43 AM   CREATININE 1.20 06/23/2020 02:50 PM   CREATININE 0.99 09/13/2017 10:40 AM   CREATININE 0.97 06/09/2016 08:36 AM    GFR 60.12 12/30/2020 11:43 AM   GFRNONAA >60 08/31/2018 06:26 AM   GFRNONAA 78 09/13/2017 10:40 AM   GFRAA >60 08/31/2018 06:26 AM   GFRAA 90 09/13/2017 10:40 AM    BMP Latest Ref Rng & Units 12/30/2020 06/23/2020 06/10/2020  Glucose 70 - 99 mg/dL 253(H) 162(H) 219(H)  BUN 6 - 23 mg/dL 21 25(H) 34(H)  Creatinine 0.40 - 1.50 mg/dL 1.21 1.20 1.19  BUN/Creat Ratio 6 - 22 (calc) - - -  Sodium 135 - 145 mEq/L 136 137 134(L)  Potassium 3.5 - 5.1 mEq/L 4.5 4.8 5.2(H)  Chloride 96 - 112 mEq/L 104 108 104  CO2 19 - 32 mEq/L 26 23 23   Calcium 8.4 - 10.5 mg/dL 10.4 10.4 10.0   Attempted to reach patient on 04/03/21, 04/09/21, 04/14/21 and left message to return call. Unsuccessful outreach.  Current antihypertensive regimen:  Furosemide 20 mg - 1 tablet by mouth once daily Losartan 50 mg - 1 and 1/2 tablet daily (total 75mg ) Propranolol 80 mg - 1 tablet (80 mg total) by mouth 2 (two) times daily. Spironolactone 50 mg - 1 tablet daily    Adherence Review: Is the patient currently on ACE/ARB medication? Yes Does the patient have >5 day gap between last estimated fill dates? Yes - losartan, metformin, and rosuvastatin  Star Rating Drugs:  Medication:   Last Fill: Day Supply Losartan 50mg   11/08/2020 90ds Metformin 1000mg   12/08/2020 90ds Rosuvastatin 10mg   12/06/2020 90ds Ozempic 0.5mg /dos  04/01/2021 84ds  Recent Relevant Labs: Lab Results  Component Value Date/Time   HGBA1C 6.1 (A) 02/17/2021 01:18 PM   HGBA1C 7.5 (A) 11/03/2020 11:24 AM   HGBA1C 8.9 (H) 12/11/2019 11:05 AM   HGBA1C 7.0 (H) 08/28/2018 06:45 PM   MICROALBUR 9.1 (H) 02/11/2010 10:11 AM   MICROALBUR 1.8 04/16/2009 09:52 AM    Kidney Function Lab Results  Component Value Date/Time   CREATININE 1.21 12/30/2020 11:43 AM   CREATININE 1.20 06/23/2020 02:50 PM   CREATININE 0.99 09/13/2017 10:40 AM   CREATININE 0.97 06/09/2016 08:36 AM   GFR 60.12 12/30/2020 11:43 AM   GFRNONAA >60 08/31/2018 06:26 AM   GFRNONAA 78 09/13/2017  10:40 AM   GFRAA >60 08/31/2018 06:26 AM   GFRAA 90 09/13/2017 10:40 AM    Diabetes  Current antihyperglycemic regimen:  Humalog Kwikpen 200 unint/ml-INJECT 14-16 UNITS INTO THE SKIN THREE TIMES DAILY BEFORE MEALS Novolin N Relion 100 unil/ml Inject under skin 30 units in a.m. and 35-40 units at bedtime Metformin 1000mg TAKE 1 TABLET BY MOUTH TWICE DAILY WITH MEALS Ozempic 0.5 mg.Inject 0.5 mg into the skin once a week.  Have there been any recent hospitalizations or ED visits since last visit with CPP? No  Unsuccessful outreach to patient  Follow-Up:  Pharmacist Review  Debbora Dus, CPP notified  [place] Clinical Pharmacy Assistant [phone]  I have reviewed the care management and care coordination activities outlined in this encounter and I am certifying that I agree with the content of this note. No further action required.  Debbora Dus, PharmD Clinical Pharmacist [facility] [phone]

## 2021-04-07 DIAGNOSIS — H40053 Ocular hypertension, bilateral: Secondary | ICD-10-CM | POA: Diagnosis not present

## 2021-04-07 DIAGNOSIS — Z9841 Cataract extraction status, right eye: Secondary | ICD-10-CM | POA: Diagnosis not present

## 2021-04-07 DIAGNOSIS — Z9842 Cataract extraction status, left eye: Secondary | ICD-10-CM | POA: Diagnosis not present

## 2021-04-07 DIAGNOSIS — E119 Type 2 diabetes mellitus without complications: Secondary | ICD-10-CM | POA: Diagnosis not present

## 2021-04-14 DIAGNOSIS — M4714 Other spondylosis with myelopathy, thoracic region: Secondary | ICD-10-CM | POA: Diagnosis not present

## 2021-04-20 DIAGNOSIS — H34812 Central retinal vein occlusion, left eye, with macular edema: Secondary | ICD-10-CM | POA: Diagnosis not present

## 2021-04-20 DIAGNOSIS — H35033 Hypertensive retinopathy, bilateral: Secondary | ICD-10-CM | POA: Diagnosis not present

## 2021-04-20 DIAGNOSIS — H40003 Preglaucoma, unspecified, bilateral: Secondary | ICD-10-CM | POA: Diagnosis not present

## 2021-04-20 DIAGNOSIS — H43811 Vitreous degeneration, right eye: Secondary | ICD-10-CM | POA: Diagnosis not present

## 2021-04-30 DIAGNOSIS — M4714 Other spondylosis with myelopathy, thoracic region: Secondary | ICD-10-CM | POA: Diagnosis not present

## 2021-04-30 DIAGNOSIS — M4804 Spinal stenosis, thoracic region: Secondary | ICD-10-CM | POA: Diagnosis not present

## 2021-04-30 DIAGNOSIS — M5124 Other intervertebral disc displacement, thoracic region: Secondary | ICD-10-CM | POA: Diagnosis not present

## 2021-04-30 DIAGNOSIS — M47814 Spondylosis without myelopathy or radiculopathy, thoracic region: Secondary | ICD-10-CM | POA: Diagnosis not present

## 2021-05-03 ENCOUNTER — Other Ambulatory Visit: Payer: Self-pay | Admitting: Internal Medicine

## 2021-05-05 DIAGNOSIS — Z6825 Body mass index (BMI) 25.0-25.9, adult: Secondary | ICD-10-CM | POA: Diagnosis not present

## 2021-05-05 DIAGNOSIS — M4714 Other spondylosis with myelopathy, thoracic region: Secondary | ICD-10-CM | POA: Diagnosis not present

## 2021-05-11 ENCOUNTER — Other Ambulatory Visit: Payer: Self-pay | Admitting: Neurosurgery

## 2021-05-13 ENCOUNTER — Other Ambulatory Visit: Payer: Self-pay | Admitting: Cardiovascular Disease

## 2021-05-20 NOTE — Progress Notes (Signed)
Surgical Instructions    Your procedure is scheduled on 05/25/21.  Report to [facility] Main Entrance "A" at 12:20 P.M., then check in with the Admitting office.  Call this number if you have problems the morning of surgery:  [phone]   If you have any questions prior to your surgery date call [phone]: Open Monday-Friday 8am-4pm    Remember:  Do not eat or drink after midnight the night before your surgery    Take these medicines the morning of surgery with A SIP OF WATER  HYDROcodone-acetaminophen ([place]) if needed pantoprazole (PROTONIX)  PARoxetine (PAXIL) propranolol (INDERAL)   As of today, STOP taking any Aspirin (unless otherwise instructed by your surgeon) Aleve, Naproxen, Ibuprofen, Motrin, Advil, Goody's, BC's, all herbal medications, fish oil, and all vitamins.  WHAT DO I DO ABOUT MY DIABETES MEDICATION?   Do not take oral diabetes medicines (pills) the morning of surgery.  THE NIGHT BEFORE SURGERY, take 50% of your dose of insulin NPH Human (NOVOLIN N RELION).    THE MORNING OF SURGERY, take 15 units of insulin NPH Human (NOVOLIN N RELION). Do not take metFORMIN (GLUCOPHAGE) or Semaglutide (Ozempic).   The day of surgery, do not take other diabetes injectables, including Byetta (exenatide), Bydureon (exenatide ER), Victoza (liraglutide), or Trulicity (dulaglutide).  If your CBG is greater than 220 mg/dL, you may take  of your sliding scale (correction) dose of Humalog.    HOW TO MANAGE YOUR DIABETES BEFORE AND AFTER SURGERY  Why is it important to control my blood sugar before and after surgery? Improving blood sugar levels before and after surgery helps healing and can limit problems. A way of improving blood sugar control is eating a healthy diet by:  Eating less sugar and carbohydrates  Increasing activity/exercise  Talking with your doctor about reaching your blood sugar goals High blood sugars (greater than 180 mg/dL) can raise your risk of  infections and slow your recovery, so you will need to focus on controlling your diabetes during the weeks before surgery. Make sure that the doctor who takes care of your diabetes knows about your planned surgery including the date and location.  How do I manage my blood sugar before surgery? Check your blood sugar at least 4 times a day, starting 2 days before surgery, to make sure that the level is not too high or low.  Check your blood sugar the morning of your surgery when you wake up and every 2 hours until you get to the Short Stay unit.  If your blood sugar is less than 70 mg/dL, you will need to treat for low blood sugar: Do not take insulin. Treat a low blood sugar (less than 70 mg/dL) with  cup of clear juice (cranberry or apple), 4 glucose tablets, OR glucose gel. Recheck blood sugar in 15 minutes after treatment (to make sure it is greater than 70 mg/dL). If your blood sugar is not greater than 70 mg/dL on recheck, call [phone] for further instructions. Report your blood sugar to the short stay nurse when you get to Short Stay.  If you are admitted to the hospital after surgery: Your blood sugar will be checked by the staff and you will probably be given insulin after surgery (instead of oral diabetes medicines) to make sure you have good blood sugar levels. The goal for blood sugar control after surgery is 80-180 mg/dL.           Do not wear jewelry or makeup Do not wear  lotions, powders, perfumes/colognes, or deodorant. Do not shave 48 hours prior to surgery.  Men may shave face and neck. Do not bring valuables to the hospital. DO Not wear nail polish, gel polish, artificial nails, or any other type of covering on natural nails   including finger and toenails. If patients have artificial nails, gel coating, etc. that need to be removed by a nail salon please have this removed prior to surgery or surgery may need to be canceled/delayed if the surgeon/ anesthesia feels  like the patient is unable to be adequately monitored.             [facility] is not responsible for any belongings or valuables.  Do NOT Smoke (Tobacco/Vaping) or drink Alcohol 24 hours prior to your procedure If you use a CPAP at night, you may bring all equipment for your overnight stay.   Contacts, glasses, dentures or bridgework may not be worn into surgery, please bring cases for these belongings   For patients admitted to the hospital, discharge time will be determined by your treatment team.   Patients discharged the day of surgery will not be allowed to drive home, and someone needs to stay with them for 24 hours.  ONLY 1 SUPPORT PERSON MAY BE PRESENT WHILE YOU ARE IN SURGERY. IF YOU ARE TO BE ADMITTED ONCE YOU ARE IN YOUR ROOM YOU WILL BE ALLOWED TWO (2) VISITORS.  Minor children may have two parents present. Special consideration for safety and communication needs will be reviewed on a case by case basis.  Special instructions:    Oral Hygiene is also important to reduce your risk of infection.  Remember - BRUSH YOUR TEETH THE MORNING OF SURGERY WITH YOUR REGULAR TOOTHPASTE   [place]- Preparing For Surgery  Before surgery, you can play an important role. Because skin is not sterile, your skin needs to be as free of germs as possible. You can reduce the number of germs on your skin by washing with CHG (chlorahexidine gluconate) Soap before surgery.  CHG is an antiseptic cleaner which kills germs and bonds with the skin to continue killing germs even after washing.     Please do not use if you have an allergy to CHG or antibacterial soaps. If your skin becomes reddened/irritated stop using the CHG.  Do not shave (including legs and underarms) for at least 48 hours prior to first CHG shower. It is OK to shave your face.  Please follow these instructions carefully.     Shower the NIGHT BEFORE SURGERY and the MORNING OF SURGERY with CHG Soap.   If you chose to wash your  hair, wash your hair first as usual with your normal shampoo. After you shampoo, rinse your hair and body thoroughly to remove the shampoo.  Then [employer] and genitals (private parts) with your normal soap and rinse thoroughly to remove soap.  After that Use CHG Soap as you would any other liquid soap. You can apply CHG directly to the skin and wash gently with a scrungie or a clean washcloth.   Apply the CHG Soap to your body ONLY FROM THE NECK DOWN.  Do not use on open wounds or open sores. Avoid contact with your eyes, ears, mouth and genitals (private parts). Wash Face and genitals (private parts)  with your normal soap.   Wash thoroughly, paying special attention to the area where your surgery will be performed.  Thoroughly rinse your body with warm water from the neck down.  DO NOT shower/wash with your normal soap after using and rinsing off the CHG Soap.  Pat yourself dry with a CLEAN TOWEL.  Wear CLEAN PAJAMAS to bed the night before surgery  Place CLEAN SHEETS on your bed the night before your surgery  DO NOT SLEEP WITH PETS.   Day of Surgery: Take a shower with CHG soap. Wear Clean/Comfortable clothing the morning of surgery Do not apply any deodorants/lotions.   Remember to brush your teeth WITH YOUR REGULAR TOOTHPASTE.   Please read over the following fact sheets that you were given.

## 2021-05-21 ENCOUNTER — Encounter (HOSPITAL_COMMUNITY): Payer: Self-pay

## 2021-05-21 ENCOUNTER — Other Ambulatory Visit: Payer: Self-pay

## 2021-05-21 ENCOUNTER — Encounter (HOSPITAL_COMMUNITY)
Admission: RE | Admit: 2021-05-21 | Discharge: 2021-05-21 | Disposition: A | Discharge status: Home / Self Care | Payer: Medicare Other | Source: Ambulatory Visit | Attending: Neurosurgery | Admitting: Neurosurgery

## 2021-05-21 DIAGNOSIS — Z20822 Contact with and (suspected) exposure to covid-19: Secondary | ICD-10-CM | POA: Diagnosis not present

## 2021-05-21 DIAGNOSIS — Z01812 Encounter for preprocedural laboratory examination: Secondary | ICD-10-CM | POA: Diagnosis not present

## 2021-05-21 LAB — BASIC METABOLIC PANEL
Anion gap: 7 (ref 5–15)
BUN: 18 mg/dL (ref 8–23)
CO2: 25 mmol/L (ref 22–32)
Calcium: 9.4 mg/dL (ref 8.9–10.3)
Chloride: 106 mmol/L (ref 98–111)
Creatinine, Ser: 1.21 mg/dL (ref 0.61–1.24)
GFR, Estimated: >60 mL/min (ref >60)
Glucose, Bld: 154 mg/dL — ABNORMAL HIGH (ref 70–99)
Potassium: 4.5 mmol/L (ref 3.5–5.1)
Sodium: 138 mmol/L (ref 135–145)

## 2021-05-21 LAB — CBC WITH DIFFERENTIAL/PLATELET
Abs Immature Granulocytes: 0.03 10*3/uL (ref 0.00–0.07)
Basophils Absolute: 0 10*3/uL (ref 0.0–0.1)
Basophils Relative: 0 %
Eosinophils Absolute: 0.3 10*3/uL (ref 0.0–0.5)
Eosinophils Relative: 5 %
HCT: 30.7 % — ABNORMAL LOW (ref 39.0–52.0)
Hemoglobin: 10.3 g/dL — ABNORMAL LOW (ref 13.0–17.0)
Immature Granulocytes: 1 %
Lymphocytes Relative: 22 %
Lymphs Abs: 1.4 10*3/uL (ref 0.7–4.0)
MCH: 35 pg — ABNORMAL HIGH (ref 26.0–34.0)
MCHC: 33.6 g/dL (ref 30.0–36.0)
MCV: 104.4 fL — ABNORMAL HIGH (ref 80.0–100.0)
Monocytes Absolute: 0.9 10*3/uL (ref 0.1–1.0)
Monocytes Relative: 15 %
Neutro Abs: 3.8 10*3/uL (ref 1.7–7.7)
Neutrophils Relative %: 57 %
Platelets: UNDETERMINED 10*3/uL (ref 150–400)
RBC: 2.94 MIL/uL — ABNORMAL LOW (ref 4.22–5.81)
RDW: 15.4 % (ref 11.5–15.5)
WBC: 6.4 10*3/uL (ref 4.0–10.5)
nRBC: 0 % (ref 0.0–0.2)

## 2021-05-21 LAB — SARS CORONAVIRUS 2 (TAT 6-24 HRS): SARS Coronavirus 2: NEGATIVE

## 2021-05-21 LAB — SURGICAL PCR SCREEN
MRSA, PCR: NEGATIVE
Staphylococcus aureus: POSITIVE — AB

## 2021-05-21 LAB — GLUCOSE, CAPILLARY: Glucose-Capillary: 134 mg/dL — ABNORMAL HIGH (ref 70–99)

## 2021-05-21 LAB — HEMOGLOBIN A1C
Hgb A1c MFr Bld: 6.8 % — ABNORMAL HIGH (ref 4.8–5.6)
Mean Plasma Glucose: 148.46 mg/dL

## 2021-05-21 NOTE — Progress Notes (Signed)
PCP - Dr. Felix Pacini Cardiologist - Dr. Sherren Mocha  PPM/ICD - n/a Device Orders - n/a Rep Notified - n/a  Chest x-ray -  EKG - 09/22/20 Stress Test - 08-28-18 ECHO - 08/28/18 Cardiac Cath - 08/31/18  Sleep Study - Yes. [facility] at [facility]. Patient cannot remember the doctor's name.  CPAP - Patient states he has a CPAP machine but has never used it.   Fasting Blood Sugar - 134 Checks Blood Sugar once a day. Ranges from 120-180 per patient  Blood Thinner Instructions: n/a Aspirin Instructions: n/a  COVID TEST-  05/21/21 at PAT visit. Pending   Anesthesia review: Yes. Cardiac history  Patient denies shortness of breath, fever, cough and chest pain at PAT appointment   All instructions explained to the patient, with a verbal understanding of the material. Patient agrees to go over the instructions while at home for a better understanding. Patient also instructed to self quarantine after being tested for COVID-19. The opportunity to ask questions was provided.

## 2021-05-22 ENCOUNTER — Telehealth: Payer: Self-pay | Admitting: Cardiovascular Disease

## 2021-05-22 ENCOUNTER — Telehealth: Payer: Self-pay | Admitting: *Deleted

## 2021-05-22 ENCOUNTER — Encounter (HOSPITAL_COMMUNITY): Payer: Self-pay | Admitting: Physician Assistant

## 2021-05-22 ENCOUNTER — Telehealth: Payer: Self-pay | Admitting: General Practice

## 2021-05-22 ENCOUNTER — Encounter: Payer: Self-pay | Admitting: Internal Medicine

## 2021-05-22 ENCOUNTER — Ambulatory Visit (INDEPENDENT_AMBULATORY_CARE_PROVIDER_SITE_OTHER): Payer: Medicare Other | Admitting: Internal Medicine

## 2021-05-22 DIAGNOSIS — L570 Actinic keratosis: Secondary | ICD-10-CM | POA: Insufficient documentation

## 2021-05-22 LAB — HEPATIC FUNCTION PANEL
ALT: 29 U/L (ref 0–44)
AST: 42 U/L — ABNORMAL HIGH (ref 15–41)
Albumin: 3.4 g/dL — ABNORMAL LOW (ref 3.5–5.0)
Alkaline Phosphatase: 81 U/L (ref 38–126)
Bilirubin, Direct: 0.2 mg/dL (ref 0.0–0.2)
Indirect Bilirubin: 0.6 mg/dL (ref 0.3–0.9)
Total Bilirubin: 0.8 mg/dL (ref 0.3–1.2)
Total Protein: 7.2 g/dL (ref 6.5–8.1)

## 2021-05-22 NOTE — Assessment & Plan Note (Addendum)
Discussed options for treatment and he gave verbal consent Liquid nitrogen 30 seconds x 2 to each lesion Tolerated well Discussed home treatment for this  If recurs, will need excision at dermatologist

## 2021-05-22 NOTE — Telephone Encounter (Signed)
Pt has appt with Laurann Montana, NP 06/04/21 for pre op clearance. See notes. I will send a message to surgeon's office as FYI pt has appt 7/7.

## 2021-05-22 NOTE — Telephone Encounter (Signed)
Primary [place], MD  Chart reviewed as part of pre-operative protocol coverage. Because of David [name] past medical history and time since last visit, he/she will require a follow-up visit in order to better assess preoperative cardiovascular risk.  Pre-op covering staff: - Please schedule appointment and call patient to inform them. - Please contact requesting surgeon's office via preferred method (i.e, phone, fax) to inform them of need for appointment prior to surgery.  If applicable, this message will also be routed to pharmacy pool and/or primary cardiologist for input on holding anticoagulant/antiplatelet agent as requested below so that this information is available at time of patient's appointment.   David Pelton, NP  05/22/2021, 11:27 AM

## 2021-05-22 NOTE — Telephone Encounter (Signed)
I s/w Lorriane Shire with Dr. Mallie Mussel Pools office and informed her that surgery for the pt will need to be postponed. Surgery clearance was received in our office today. Per pre op provider pt will require an appt with the cardiologist before he can be cleared. I did state that our schedules are very full, though I will do everything I can to get this pt scheduled for pre op clearance. Lorriane Shire states she completely understands and appreciate the call to keep them updated as welI. I will reach to the pt with an appt for pre op clearance.

## 2021-05-22 NOTE — Telephone Encounter (Signed)
I will close this phone note so that we continue to make notes on the pre op notes as the conversation is pertaining to pre op clearance.

## 2021-05-22 NOTE — Telephone Encounter (Signed)
Patient Calls (Newest Message First) Replies will be sent to [place]  Cleaver, Jossie Ng, NP  You 4 minutes ago (3:59 PM)    Patient needs to have appointment scheduled.  Thank you.    Deberah Pelton, NP 4 minutes ago (3:59 PM)    Spoke with patient today regarding protocols for cardiac evaluation related to surgical procedures.  He reported that he was very frustrated and used curse words throughout phone conversation.  He stated " this is ridiculous, I thought I only had to be seen once per year."  It was explained to the patient that he had not been seen in greater than 6 months and would need to be seen in the office prior to cardiac clearance been provided.  Patient hung up the phone mid conversation.  He will need call back and appointment scheduled.      Note    David, [name] [phone]  Deberah Pelton, NP 10 minutes ago (3:53 PM)    Spoke with patient regarding protocols for cardiac evaluation.   Incoming call

## 2021-05-22 NOTE — Telephone Encounter (Signed)
   [facility] Pre-operative Risk Assessment    Patient Name: David [name]  DOB: [birth year]  MRN: [id number]   HEARTCARE STAFF: - Please ensure there is not already an duplicate clearance open for this procedure. - Under Visit Info/Reason for Call, type in Other and utilize the format Clearance MM/DD/YY or Clearance TBD. Do not use dashes or single digits. - If request is for dental extraction, please clarify the # of teeth to be extracted. - If the patient is currently at the dentist's office, call Pre-Op APP to address. If the patient is not currently in the dentist office, please route to the Pre-Op pool  Request for surgical clearance:  What type of surgery is being performed? T11-12 THORACIC MICRODISKECTOMY   When is this surgery scheduled? 05/25/21   What type of clearance is required (medical clearance vs. Pharmacy clearance to hold med vs. Both)? MEDICAL  Are there any medications that need to be held prior to surgery and how long? NONE LISTED   Practice name and name of physician performing surgery? [facility]: DR. Mallie Mussel POOL   What is the office phone number? [phone]   7.   What is the office fax number? [phone] ATTN: VANESSA x [phone]  8.   Anesthesia type (None, local, MAC, general) ? GENERAL   Julaine Hua 05/22/2021, 11:06 AM  _________________________________________________________________   (provider comments below)

## 2021-05-22 NOTE — Progress Notes (Signed)
Subjective:    Patient ID: David [name], male    DOB: [birth year], 70 y.o.   MRN: [id number]  HPI Here due to a spot on his nose This visit occurred during the SARS-CoV-2 public health emergency.  Safety protocols were in place, including screening questions prior to the visit, additional usage of staff PPE, and extensive cleaning of exam room while observing appropriate contact time as indicated for disinfecting solutions.   Has had the spot on his nose for a year or so Now more scaly and has a "bump" No pain or irritation Did try acne medication and cortisone cream--nothing helped  Current Outpatient Medications on File Prior to Visit  Medication Sig Dispense Refill   ALPHA LIPOIC ACID PO Take 1 tablet by mouth daily.     Ascorbic Acid (VITAMIN C) 1000 MG tablet Take 1,000 mg by mouth daily.     Coenzyme Q10 (CO Q 10) 100 MG CAPS Take 100 mg by mouth daily.     ferrous sulfate 325 (65 FE) MG tablet Take 325 mg by mouth daily with breakfast.     furosemide (LASIX) 20 MG tablet Take 1 tablet by mouth once daily (Patient taking differently: Take 20 mg by mouth daily.) 90 tablet 1   glucose blood (ONE TOUCH ULTRA TEST) test strip Use to test blood sugar 2 times daily as instructed. Dx: E11.59, E11.65 100 each 5   insulin lispro (HUMALOG KWIKPEN) 200 UNIT/ML KwikPen INJECT 14-16 UNITS INTO THE SKIN THREE TIMES DAILY BEFORE MEALS (Patient taking differently: Inject 14-16 Units into the skin 3 (three) times daily before meals. INJECT 14-16 UNITS INTO THE SKIN THREE TIMES DAILY BEFORE MEALS) 30 mL 3   insulin NPH Human (NOVOLIN N RELION) 100 UNIT/ML injection Inject under skin 30 units in a.m. and 35-40 units at bedtime (Patient taking differently: Inject 30-40 Units into the skin See admin instructions. Inject under skin 30 units in a.m. and 35-40 units at bedtime) 20 mL 11   Insulin Pen Needle 32G X 4 MM MISC Use 4x a day - with insulin 200 each 3   Insulin Syringe-Needle U-100 (RELION INSULIN  SYRINGE) 31G X 15/64" 0.5 ML MISC USE  TWICE DAILY 100 each 5   losartan (COZAAR) 50 MG tablet Take 1.5 tablets (75 mg total) by mouth daily. Please make yearly appt with Dr. Burt Knack for October 2022 for future refills. Thank you 1st attempt 135 tablet 1   metFORMIN (GLUCOPHAGE) 1000 MG tablet TAKE 1 TABLET BY MOUTH TWICE DAILY WITH MEALS (Patient taking differently: Take 1,000 mg by mouth 2 (two) times daily with a meal.) 180 tablet 1   Multiple Vitamin (MULTIVITAMIN WITH MINERALS) TABS tablet Take 1 tablet by mouth at bedtime.     ONETOUCH DELICA LANCETS FINE MISC Use to test blood sugar 2 times daily as instructed. Dx: E11.59, E11.65 100 each 5   pantoprazole (PROTONIX) 40 MG tablet Take 1 tablet by mouth twice daily (Patient taking differently: Take 40 mg by mouth 2 (two) times daily.) 180 tablet 3   PARoxetine (PAXIL) 20 MG tablet TAKE 2 TABLETS BY MOUTH ONCE DAILY (NEEDS  OFFICE  VISIT) (Patient taking differently: Take 40 mg by mouth daily.) 60 tablet 0   Polyethyl Glycol-Propyl Glycol (SYSTANE) 0.4-0.3 % SOLN Place 2 drops into both eyes daily.     propranolol (INDERAL) 80 MG tablet Take 1 tablet (80 mg total) by mouth 2 (two) times daily. 60 tablet 6   rosuvastatin (CRESTOR) 10 MG tablet  Take 1 tablet by mouth once daily (Patient taking differently: Take 10 mg by mouth daily.) 30 tablet 0   Semaglutide,0.25 or 0.5MG /DOS, (OZEMPIC, 0.25 OR 0.5 MG/DOSE,) 2 MG/1.5ML SOPN Inject 0.5 mg into the skin once a week. 4.5 mL 3   sildenafil (VIAGRA) 100 MG tablet TAKE 1 TABLET BY MOUTH ONCE DAILY AS NEEDED FOR ERECTILE DYSFUNCTION (Patient taking differently: Take 100 mg by mouth as needed for erectile dysfunction.) 10 tablet 1   spironolactone (ALDACTONE) 50 MG tablet Take 1 tablet by mouth once daily (Patient taking differently: Take 50 mg by mouth daily.) 90 tablet 1   triamcinolone (KENALOG) 0.1 % Apply 1 application topically 2 (two) times daily as needed. Apply to scalp     No current  facility-administered medications on file prior to visit.    Allergies  Allergen Reactions   Venlafaxine     Slowed his urine stream    Past Medical History:  Diagnosis Date   Allergy    Anemia    Arthritis    Bilateral hands and feet   CAD (coronary artery disease)    s/p bifurcation stenting (DES x2) and stenting bifurcation lesion distal RCA (1 DES)   Cataract    Cirrhosis (HCC)    Depression    Diabetes mellitus    Esophageal varices (HCC)    GERD (gastroesophageal reflux disease)    Hyperlipemia    Hypertension    Myocardial infarction ([place])    Sleep apnea    CPAP    Past Surgical History:  Procedure Laterality Date   BIOPSY  09/01/2018   Procedure: BIOPSY;  Surgeon: Mauri Pole, MD;  [facility];  Service: Endoscopy;;   CATARACT EXTRACTION Bilateral    CORONARY ANGIOPLASTY WITH STENT PLACEMENT   March 2010   Dr. Olevia Perches   ESOPHAGOGASTRODUODENOSCOPY (EGD) WITH PROPOFOL N/A 09/01/2018   Procedure: ESOPHAGOGASTRODUODENOSCOPY (EGD) WITH PROPOFOL;  Surgeon: Mauri Pole, MD;  Location: [facility];  Service: Endoscopy;  Laterality: N/A;   LEFT HEART CATH AND CORONARY ANGIOGRAPHY N/A 08/31/2018   Procedure: LEFT HEART CATH AND CORONARY ANGIOGRAPHY;  Surgeon: Sherren Mocha, MD;  Location: [facility];  Service: Cardiovascular;  Laterality: N/A;   TONSILLECTOMY  1959    Family History  Problem Relation Age of Onset   Heart failure Father        Deceased 31 y/o   Diabetes Father    Cancer Father        Prostate   Heart failure Mother        Deceased 13 y/o   Alcohol abuse Mother    Schizophrenia Son        Paranoid   Uveitis Son    Cancer Brother        prostate   Colon cancer Neg Hx    Esophageal cancer Neg Hx    Stomach cancer Neg Hx    Liver cancer Neg Hx    Pancreatic cancer Neg Hx    Rectal cancer Neg Hx     Social History   Socioeconomic History   Marital status: Divorced    Spouse name: Not on file   Number of  children: 2   Years of education: Not on file   Highest education level: Not on file  Occupational History   Occupation: Patent examiner buildings    Comment: site closed   Occupation: Engineering geologist    Comment: part time    Comment: Part time work  Tobacco Use  Smoking status: Former    Pack years: 0.00    Types: Cigarettes    Quit date: 11/30/1983    Years since quitting: 37.5   Smokeless tobacco: Never  Vaping Use   Vaping Use: Never used  Substance and Sexual Activity   Alcohol use: Yes    Alcohol/week: 20.0 standard drinks    Types: 20 Cans of beer per week   Drug use: No   Sexual activity: Not Currently  Other Topics Concern   Not on file  Social History Narrative   No living will   Would want want son [name], as health care POA   Would accept resuscitation   Not sure about tube feeds--but doesn't want if cognitively unaware   Social Determinants of Health   Financial Resource Strain: Not on file  Food Insecurity: Not on file  Transportation Needs: Not on file  Physical Activity: Not on file  Stress: Not on file  Social Connections: Not on file  Intimate Partner Violence: Not on file   Review of Systems     Objective:   Physical Exam Skin:    Comments: 62mm oval raised area on bridge of nose--slight scaling 71mm crusted area below that           Assessment & Plan:

## 2021-05-22 NOTE — Telephone Encounter (Signed)
Spoke with patient today regarding protocols for cardiac evaluation related to surgical procedures.  He reported that he was very frustrated and used curse words throughout phone conversation.  He stated " this is ridiculous, I thought I only had to be seen once per year."  It was explained to the patient that he had not been seen in greater than 6 months and would need to be seen in the office prior to cardiac clearance been provided.  Patient hung up the phone mid conversation.  He will need call back and appointment scheduled.

## 2021-05-25 ENCOUNTER — Ambulatory Visit (HOSPITAL_COMMUNITY): Admission: RE | Admit: 2021-05-25 | Payer: Medicare Other | Source: Home / Self Care | Admitting: Neurosurgery

## 2021-05-25 SURGERY — THORACIC DISCECTOMY
Anesthesia: General | Site: Back | Laterality: Right

## 2021-05-26 ENCOUNTER — Telehealth: Payer: Self-pay

## 2021-05-26 NOTE — Telephone Encounter (Signed)
Called and left message for patient that he will be due for 6 mo f/u appt with Dr. Ardis Hughs in August and that that schedule is out and filling up quickly. Asked patient to call back soon to get scheduled.

## 2021-05-26 NOTE — Telephone Encounter (Signed)
Patient returned call. Scheduled appointment 9/20 as there were no August appointments to show

## 2021-05-26 NOTE — Telephone Encounter (Signed)
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

## 2021-06-03 ENCOUNTER — Telehealth: Payer: Self-pay

## 2021-06-03 NOTE — Chronic Care Management (AMB) (Addendum)
Chronic Care Management Pharmacy Assistant   Name: David [name]  MRN: [id number] DOB: 1950-12-06  Reason for Encounter: CCM Reminder Call   Recent office visits:  05/22/21 [facility] PCP- Skin lesion removed. No medication changes. Follow up 6 months.  Recent consult visits:  05/05/21 - Neurosurgery no data found 04/14/21 - Neurosurgery no data found  Hospital visits:  None in previous 6 months  Medications: Outpatient Encounter Medications as of 06/03/2021  Medication Sig Note   ALPHA LIPOIC ACID PO Take 1 tablet by mouth daily.    Ascorbic Acid (VITAMIN C) 1000 MG tablet Take 1,000 mg by mouth daily.    Coenzyme Q10 (CO Q 10) 100 MG CAPS Take 100 mg by mouth daily.    ferrous sulfate 325 (65 FE) MG tablet Take 325 mg by mouth daily with breakfast.    furosemide (LASIX) 20 MG tablet Take 1 tablet by mouth once daily (Patient taking differently: Take 20 mg by mouth daily.)    glucose blood (ONE TOUCH ULTRA TEST) test strip Use to test blood sugar 2 times daily as instructed. Dx: E11.59, E11.65    insulin lispro (HUMALOG KWIKPEN) 200 UNIT/ML KwikPen INJECT 14-16 UNITS INTO THE SKIN THREE TIMES DAILY BEFORE MEALS (Patient taking differently: Inject 14-16 Units into the skin 3 (three) times daily before meals. INJECT 14-16 UNITS INTO THE SKIN THREE TIMES DAILY BEFORE MEALS)    insulin NPH Human (NOVOLIN N RELION) 100 UNIT/ML injection Inject under skin 30 units in a.m. and 35-40 units at bedtime (Patient taking differently: Inject 30-40 Units into the skin See admin instructions. Inject under skin 30 units in a.m. and 35-40 units at bedtime)    Insulin Pen Needle 32G X 4 MM MISC Use 4x a day - with insulin    Insulin Syringe-Needle U-100 (RELION INSULIN SYRINGE) 31G X 15/64" 0.5 ML MISC USE  TWICE DAILY    losartan (COZAAR) 50 MG tablet Take 1.5 tablets (75 mg total) by mouth daily. Please make yearly appt with Dr. Burt Knack for October 2022 for future refills. Thank you 1st attempt     metFORMIN (GLUCOPHAGE) 1000 MG tablet TAKE 1 TABLET BY MOUTH TWICE DAILY WITH MEALS (Patient taking differently: Take 1,000 mg by mouth 2 (two) times daily with a meal.)    Multiple Vitamin (MULTIVITAMIN WITH MINERALS) TABS tablet Take 1 tablet by mouth at bedtime.    ONETOUCH DELICA LANCETS FINE MISC Use to test blood sugar 2 times daily as instructed. Dx: E11.59, E11.65    pantoprazole (PROTONIX) 40 MG tablet Take 1 tablet by mouth twice daily (Patient taking differently: Take 40 mg by mouth 2 (two) times daily.)    PARoxetine (PAXIL) 20 MG tablet TAKE 2 TABLETS BY MOUTH ONCE DAILY (NEEDS  OFFICE  VISIT) (Patient taking differently: Take 40 mg by mouth daily.)    Polyethyl Glycol-Propyl Glycol (SYSTANE) 0.4-0.3 % SOLN Place 2 drops into both eyes daily.    propranolol (INDERAL) 80 MG tablet Take 1 tablet (80 mg total) by mouth 2 (two) times daily.    rosuvastatin (CRESTOR) 10 MG tablet Take 1 tablet by mouth once daily (Patient taking differently: Take 10 mg by mouth daily.)    Semaglutide,0.25 or 0.5MG /DOS, (OZEMPIC, 0.25 OR 0.5 MG/DOSE,) 2 MG/1.5ML SOPN Inject 0.5 mg into the skin once a week. 05/15/2021: Sunday   sildenafil (VIAGRA) 100 MG tablet TAKE 1 TABLET BY MOUTH ONCE DAILY AS NEEDED FOR ERECTILE DYSFUNCTION (Patient taking differently: Take 100 mg by mouth as  needed for erectile dysfunction.)    spironolactone (ALDACTONE) 50 MG tablet Take 1 tablet by mouth once daily (Patient taking differently: Take 50 mg by mouth daily.)    triamcinolone (KENALOG) 0.1 % Apply 1 application topically 2 (two) times daily as needed. Apply to scalp    No facility-administered encounter medications on file as of 06/03/2021.   David [name] was contacted to remind him of his upcoming telephone visit with Debbora Dus on 06/08/2021 at 1:00PM.  He was reminded to have all medications, supplements and any blood glucose and blood pressure readings available for review at appointment.  Are you having any problems  with your medications? No the patient states no current problems  What concerns would you like to discuss with the pharmacist? None identified at this time    Star Rating Drugs: Losartan 50mg  05/14/21 90ds Rosuvastatin 10mg  05/04/21 30ds Metformin 1000mg  12/08/20 90ds Humalog  -  - Novolin  -  - Ozempic  06/01/21  28ds  The patient states he uses all (3 )Novolin, Humalog, Ozempic as prescribed. Mail order pharmacy.  PCP appointment on 10/06/21, CCM appointment on 06/08/21, and Endocrinology appointment on 07/14/21  Debbora Dus, CPP notified  Avel Sensor, [place] Assistant [phone]  I have reviewed the care management and care coordination activities outlined in this encounter and I am certifying that I agree with the content of this note. No further action required.  Debbora Dus, PharmD Clinical Pharmacist [facility] [phone]

## 2021-06-04 ENCOUNTER — Encounter (HOSPITAL_BASED_OUTPATIENT_CLINIC_OR_DEPARTMENT_OTHER): Payer: Self-pay | Admitting: Family

## 2021-06-04 ENCOUNTER — Ambulatory Visit (HOSPITAL_BASED_OUTPATIENT_CLINIC_OR_DEPARTMENT_OTHER): Payer: Medicare Other | Admitting: Family

## 2021-06-04 ENCOUNTER — Other Ambulatory Visit: Payer: Self-pay

## 2021-06-04 VITALS — BP 108/72 | HR 67 | Ht 69.0 in | Wt 170.6 lb

## 2021-06-04 DIAGNOSIS — E785 Hyperlipidemia, unspecified: Secondary | ICD-10-CM | POA: Diagnosis not present

## 2021-06-04 DIAGNOSIS — I10 Essential (primary) hypertension: Secondary | ICD-10-CM

## 2021-06-04 DIAGNOSIS — I25118 Atherosclerotic heart disease of native coronary artery with other forms of angina pectoris: Secondary | ICD-10-CM

## 2021-06-04 DIAGNOSIS — Z01810 Encounter for preprocedural cardiovascular examination: Secondary | ICD-10-CM

## 2021-06-04 MED ORDER — LOSARTAN POTASSIUM 50 MG PO TABS: 50.0000 mg | ORAL_TABLET | Freq: Every day | ORAL | 3 refills | Status: DC

## 2021-06-04 NOTE — Progress Notes (Signed)
Office Visit    Patient Name: David [name] Date of Encounter: 06/04/2021  PCP:  [name], MD   [facility]  Cardiologist:  Sherren Mocha, MD  Advanced Practice Provider:  No care team member to display Electrophysiologist:  None    Chief Complaint    [name] is a 70 y.o. male with a hx of coronary artery disease with two-vessel PCI in 2010, hypertension, diabetes, hyperlipidemia presents today for cardiac clearance    Past Medical History    Past Medical History:  Diagnosis Date   Allergy    Anemia    Arthritis    Bilateral hands and feet   CAD (coronary artery disease)    s/p bifurcation stenting (DES x2) and stenting bifurcation lesion distal RCA (1 DES)   Cataract    Cirrhosis ([place])    Depression    Diabetes mellitus    Esophageal varices (HCC)    GERD (gastroesophageal reflux disease)    Hyperlipemia    Hypertension    Myocardial infarction ([place])    Sleep apnea    CPAP   Past Surgical History:  Procedure Laterality Date   BIOPSY  09/01/2018   Procedure: BIOPSY;  Surgeon: Mauri Pole, MD;  [facility];  Service: Endoscopy;;   CATARACT EXTRACTION Bilateral    CORONARY ANGIOPLASTY WITH STENT PLACEMENT   March 2010   Dr. Olevia Perches   ESOPHAGOGASTRODUODENOSCOPY (EGD) WITH PROPOFOL N/A 09/01/2018   Procedure: ESOPHAGOGASTRODUODENOSCOPY (EGD) WITH PROPOFOL;  Surgeon: Mauri Pole, MD;  [facility];  Service: Endoscopy;  Laterality: N/A;   LEFT HEART CATH AND CORONARY ANGIOGRAPHY N/A 08/31/2018   Procedure: LEFT HEART CATH AND CORONARY ANGIOGRAPHY;  Surgeon: Sherren Mocha, MD;  Location: [facility];  Service: Cardiovascular;  Laterality: N/A;   TONSILLECTOMY  1959    Allergies  Allergies  Allergen Reactions   Venlafaxine     Slowed his urine stream    History of Present Illness    David [name] is a 70 y.o. male with a hx of coronary artery disease with two-vessel PCI in 2010,  hypertension, diabetes, hyperlipidemia  last seen 09/22/2020.  2019 he presented shortness of breath and chest discomfort.  Stress test was abnormal.  He was taken for cardiac catheterization but hemoglobin was 5.6.  Ultimately diagnosed with cirrhosis, grade 2 esophageal varices, nonbleeding gastric ulcer.  Beta-blocker was changed to nadolol.  He underwent Cardiac catheterization showing patent stents in circumflex and RCA with only mild restenosis.  He was recommended for medical therapy.  He is followed routinely in the office.  He was seen most recently 09/22/2020 by Dr. Burt Knack.  He was doing well from a cardiac perspective.  He did continue to drink alcohol and struggled with the idea of quitting.  He presents today for follow-up.  He has upcoming back surgery. Reports no shortness of breath nor dyspnea on exertion. Reports no chest pain, pressure, or tightness. No edema, orthopnea, PND. Reports no palpitations.  He does note some lightheadedness with quick position changes and has had some hypotensive blood pressure readings at home.  No syncope nor near syncope.  EKGs/Labs/Other Studies Reviewed:   The following studies were reviewed today:   EKG:  EKG is ordered today.  The ekg ordered today demonstrates SR 67 bpm with no acute ST/T wave changes.   Recent Labs: 05/21/2021: ALT 29; BUN 18; Creatinine, Ser 1.21; Hemoglobin 10.3; Platelets PLATELET CLUMPS NOTED ON SMEAR, UNABLE TO ESTIMATE; Potassium  4.5; Sodium 138  Recent Lipid Panel    Component Value Date/Time   CHOL 112 12/11/2019 1105   TRIG 65.0 12/11/2019 1105   HDL 46.60 12/11/2019 1105   CHOLHDL 2 12/11/2019 1105   VLDL 13.0 12/11/2019 1105   LDLCALC 52 12/11/2019 1105   LDLDIRECT 103.9 11/05/2013 1249    Home Medications   Current Meds  Medication Sig   ALPHA LIPOIC ACID PO Take 1 tablet by mouth daily.   Ascorbic Acid (VITAMIN C) 1000 MG tablet Take 1,000 mg by mouth daily.   Coenzyme Q10 (CO Q 10) 100 MG CAPS Take  100 mg by mouth daily.   ferrous sulfate 325 (65 FE) MG tablet Take 325 mg by mouth daily with breakfast.   furosemide (LASIX) 20 MG tablet Take 1 tablet by mouth once daily   glucose blood (ONE TOUCH ULTRA TEST) test strip Use to test blood sugar 2 times daily as instructed. Dx: E11.59, E11.65   insulin lispro (HUMALOG KWIKPEN) 200 UNIT/ML KwikPen INJECT 14-16 UNITS INTO THE SKIN THREE TIMES DAILY BEFORE MEALS   insulin NPH Human (NOVOLIN N RELION) 100 UNIT/ML injection Inject under skin 30 units in a.m. and 35-40 units at bedtime   Insulin Pen Needle 32G X 4 MM MISC Use 4x a day - with insulin   Insulin Syringe-Needle U-100 (RELION INSULIN SYRINGE) 31G X 15/64" 0.5 ML MISC USE  TWICE DAILY   losartan (COZAAR) 50 MG tablet Take 1.5 tablets (75 mg total) by mouth daily. Please make yearly appt with Dr. Burt Knack for October 2022 for future refills. Thank you 1st attempt   metFORMIN (GLUCOPHAGE) 1000 MG tablet TAKE 1 TABLET BY MOUTH TWICE DAILY WITH MEALS (Patient taking differently: Take 1,000 mg by mouth 2 (two) times daily with a meal.)   Multiple Vitamin (MULTIVITAMIN WITH MINERALS) TABS tablet Take 1 tablet by mouth at bedtime.   ONETOUCH DELICA LANCETS FINE MISC Use to test blood sugar 2 times daily as instructed. Dx: E11.59, E11.65   pantoprazole (PROTONIX) 40 MG tablet Take 1 tablet by mouth twice daily (Patient taking differently: Take 40 mg by mouth 2 (two) times daily.)   PARoxetine (PAXIL) 20 MG tablet TAKE 2 TABLETS BY MOUTH ONCE DAILY (NEEDS  OFFICE  VISIT) (Patient taking differently: Take 40 mg by mouth daily.)   Polyethyl Glycol-Propyl Glycol (SYSTANE) 0.4-0.3 % SOLN Place 2 drops into both eyes daily.   propranolol (INDERAL) 80 MG tablet Take 1 tablet (80 mg total) by mouth 2 (two) times daily.   rosuvastatin (CRESTOR) 10 MG tablet Take 1 tablet by mouth once daily (Patient taking differently: Take 10 mg by mouth daily.)   Semaglutide,0.25 or 0.5MG /DOS, (OZEMPIC, 0.25 OR 0.5 MG/DOSE,)  2 MG/1.5ML SOPN Inject 0.5 mg into the skin once a week.   sildenafil (VIAGRA) 100 MG tablet TAKE 1 TABLET BY MOUTH ONCE DAILY AS NEEDED FOR ERECTILE DYSFUNCTION (Patient taking differently: Take 100 mg by mouth as needed for erectile dysfunction.)   spironolactone (ALDACTONE) 50 MG tablet Take 1 tablet by mouth once daily (Patient taking differently: Take 50 mg by mouth daily.)   traMADol (ULTRAM) 50 MG tablet Take 50-100 mg by mouth 4 (four) times daily as needed.   triamcinolone (KENALOG) 0.1 % Apply 1 application topically 2 (two) times daily as needed. Apply to scalp     Review of Systems   All other systems reviewed and are otherwise negative except as noted above.  Physical Exam    VS:  BP  108/72   Pulse 67   Ht 5\' 9"  (1.753 m)   Wt 170 lb 9.6 oz (77.4 kg)   SpO2 97%   BMI 25.19 kg/m  , BMI Body mass index is 25.19 kg/m.  Wt Readings from Last 3 Encounters:  06/04/21 170 lb 9.6 oz (77.4 kg)  05/22/21 177 lb (80.3 kg)  05/21/21 177 lb 12.8 oz (80.6 kg)   GEN: Well nourished, well developed, in no acute distress. HEENT: normal. Neck: Supple, no JVD, carotid bruits, or masses. Cardiac: RRR, no murmurs, rubs, or gallops. No clubbing, cyanosis, edema.  Radials/PT 2+ and equal bilaterally.  Respiratory:  Respirations regular and unlabored, clear to auscultation bilaterally. GI: Soft, nontender, nondistended. MS: No deformity or atrophy. Skin: Warm and dry, no rash. Neuro:  Strength and sensation are intact. Psych: Normal affect.  Assessment & Plan    Preoperative cardiovascular examination -upcoming microdiscectomy.  Despite significant back pain has good exercise tolerance.  Reports no anginal symptoms.  EKG today is without acute ST/T wave changes. According to the Revised Cardiac Risk Index (RCRI), his Perioperative Risk of Major Cardiac Event is (%): 0.9. His Functional Capacity in METs is: 4.4 according to the [facility] Activity Status Index (DASI).  He is deemed acceptable  risk for the planned procedure without additional cardiovascular testing.  Will route to surgeon's office via epic fax function.  CAD - Stable with no anginal symptoms. No indication for ischemic evaluation.  GDMT includes rosuvastatin, propanolol.  No aspirin due to history of cirrhosis, bleeding. Heart healthy diet and regular cardiovascular exercise encouraged.    HTN  - BP well controlled though now with episodes of hypotension.  Reduce losartan to 50 mg daily.  HLD -continue rosuvastatin 10 mg daily.  Disposition: Follow up  October 2022 or next available  with Dr. Burt Knack  Signed, Loel Dubonnet, NP 06/04/2021, 11:15 AM [place]

## 2021-06-04 NOTE — Patient Instructions (Signed)
Medication Instructions:  Your physician has recommended you make the following change in your medication:   CHANGE Losartan to 50mg  (one tablet) once per day    *If you need a refill on your cardiac medications before your next appointment, please call your pharmacy*   Lab Work: None ordered today.   If you have labs (blood work) drawn today and your tests are completely normal, you will receive your results only by: [place] (if you have MyChart) OR A paper copy in the mail If you have any lab test that is abnormal or we need to change your treatment, we will call you to review the results.   Testing/Procedures: Your EKG today shows normal sinus rhythm.    Follow-Up: At [facility], you and your health needs are our priority.  As part of our continuing mission to provide you with exceptional heart care, we have created designated Provider Care Teams.  These Care Teams include your primary Cardiologist (physician) and Advanced Practice Providers (APPs -  Physician Assistants and Nurse Practitioners) who all work together to provide you with the care you need, when you need it.  We recommend signing up for the patient portal called "MyChart".  Sign up information is provided on this After Visit Summary.  MyChart is used to connect with patients for Virtual Visits (Telemedicine).  Patients are able to view lab/test results, encounter notes, upcoming appointments, etc.  Non-urgent messages can be sent to your provider as well.   To learn more about what you can do with MyChart, go to [email or web address].    Your next appointment:   October 2023 with Dr. Burt Knack (or when available)  Other Instructions  Tips to Measure your Blood Pressure Correctly  To determine whether you have hypertension, a medical professional will take a blood pressure reading. How you prepare for the test, the position of your arm, and other factors can change a blood pressure reading by 10% or  more. That could be enough to hide high blood pressure, start you on a drug you don't really need, or lead your doctor to incorrectly adjust your medications.  National and international guidelines offer specific instructions for measuring blood pressure. If a doctor, nurse, or medical assistant isn't doing it right, don't hesitate to ask him or her to get with the guidelines.  Here's what you can do to ensure a correct reading:  Don't drink a caffeinated beverage or smoke during the 30 minutes before the test.  Sit quietly for five minutes before the test begins.  During the measurement, sit in a chair with your feet on the floor and your arm supported so your elbow is at about heart level.  The inflatable part of the cuff should completely cover at least 80% of your upper arm, and the cuff should be placed on bare skin, not over a shirt.  Don't talk during the measurement.  Have your blood pressure measured twice, with a brief break in between. If the readings are different by 5 points or more, have it done a third time.  There are times to break these rules. If you sometimes feel lightheaded when getting out of bed in the morning or when you stand after sitting, you should have your blood pressure checked while seated and then while standing to see if it falls from one position to the next.  Because blood pressure varies throughout the day, your doctor will rarely diagnose hypertension on the basis of a single reading. Instead, he  or she will want to confirm the measurements on at least two occasions, usually within a few weeks of one another. The exception to this rule is if you have a blood pressure reading of 180/110 mm Hg or higher. A result this high usually calls for prompt treatment.  It's a good idea to have your blood pressure measured in both arms at least once, since the reading in one arm (usually the right) may be higher than that in the left. A 2014 study in The American Journal of  Medicine of nearly 3,400 people found average arm- to-arm differences in systolic blood pressure of about 5 points. The higher number should be used to make treatment decisions.  In 2017, new guidelines from the [place], the [employer] of Cardiology, and nine other health organizations lowered the diagnosis of high blood pressure to 130/80 mm Hg or higher for all adults. The guidelines also redefined the various blood pressure categories to now include normal, elevated, Stage 1 hypertension, Stage 2 hypertension, and hypertensive crisis (see "Blood pressure categories").  Blood pressure categories  Blood pressure category SYSTOLIC (upper number)  DIASTOLIC (lower number)  Normal Less than 120 mm Hg and Less than 80 mm Hg  Elevated 120-129 mm Hg and Less than 80 mm Hg  High blood pressure: Stage 1 hypertension 130-139 mm Hg or 80-89 mm Hg  High blood pressure: Stage 2 hypertension 140 mm Hg or higher or 90 mm Hg or higher  Hypertensive crisis (consult your doctor immediately) Higher than 180 mm Hg and/or Higher than 120 mm Hg  Source: American Heart Association and American Stroke Association. For more on getting your blood pressure under control, buy Controlling Your Blood Pressure, a Special Health Report from [facility].   Blood Pressure Log   Date   Time  Blood Pressure  Position  Example: Nov 1 9 AM 124/78 sitting

## 2021-06-08 ENCOUNTER — Other Ambulatory Visit: Payer: Self-pay

## 2021-06-08 ENCOUNTER — Ambulatory Visit (INDEPENDENT_AMBULATORY_CARE_PROVIDER_SITE_OTHER): Payer: Medicare Other

## 2021-06-08 DIAGNOSIS — E1159 Type 2 diabetes mellitus with other circulatory complications: Secondary | ICD-10-CM

## 2021-06-08 DIAGNOSIS — I10 Essential (primary) hypertension: Secondary | ICD-10-CM | POA: Diagnosis not present

## 2021-06-08 DIAGNOSIS — E1165 Type 2 diabetes mellitus with hyperglycemia: Secondary | ICD-10-CM | POA: Diagnosis not present

## 2021-06-08 NOTE — Progress Notes (Signed)
Chronic Care Management Pharmacy Note  06/19/2021 Name:  David [name] MRN:  [id number] DOB:  July 27, 1951  Subjective: David [name] is an 70 y.o. year old male who is a primary patient of [name], MD.  The CCM team was consulted for assistance with disease management and care coordination needs.    Engaged with patient by telephone for follow up visit in response to provider referral for pharmacy case management and/or care coordination services. He reports he had a recent cardio visit and normal findings. Back surgery scheduled. Reports new meds - tramadol for back pain.  Consent to Services:  The patient was given information about Chronic Care Management services, agreed to services, and gave verbal consent prior to initiation of services.  Please see initial visit note for detailed documentation.   Patient Care Team: [name], MD as PCP - Cyndia Diver, MD as PCP - Cardiology (Cardiology) Debbora Dus, [facility] as Pharmacist (Pharmacist)  Recent office visits: 05/22/21 - Dr. Silvio Pate, PCP - Pt presented for actinic keratoses. Treated with liquid nitrogen in office. If recurrence, excision at dermatologist.  Recent consult visits: 06/04/21 - Cardiology - Pt presented for pre-op exam. CAD stable. Some hypotension. Reduce losartan to 50 mg daily. Follow up October 2022. 02/17/21 - Endocrinology - Purchase a new glucose meter. A1c 6.1% improved. Continue metformin 2000 mg/day. Ozempic 0.5 mg weekly, NPH 30 units am and 40 units bedtime. Humalog 14-16 units before meals.   Hospital visits: None in previous 6 months   Objective:  Lab Results  Component Value Date   CREATININE 1.21 05/21/2021   BUN 18 05/21/2021   GFR 60.12 12/30/2020   GFRNONAA >60 05/21/2021   GFRAA >60 08/31/2018   NA 138 05/21/2021   K 4.5 05/21/2021   CALCIUM 9.4 05/21/2021   CO2 25 05/21/2021   GLUCOSE 154 (H) 05/21/2021    Lab Results  Component Value Date/Time   HGBA1C 6.8 (H)  05/21/2021 08:25 AM   HGBA1C 6.1 (A) 02/17/2021 01:18 PM   HGBA1C 7.5 (A) 11/03/2020 11:24 AM   HGBA1C 8.9 (H) 12/11/2019 11:05 AM   GFR 60.12 12/30/2020 11:43 AM   GFR 59.64 (L) 06/23/2020 02:50 PM   MICROALBUR 9.1 (H) 02/11/2010 10:11 AM   MICROALBUR 1.8 04/16/2009 09:52 AM    Last diabetic Eye exam:  Lab Results  Component Value Date/Time   HMDIABEYEEXA No Retinopathy 12/24/2019 12:00 AM    Last diabetic Foot exam: 09/08/20 podiatry   Lab Results  Component Value Date   CHOL 112 12/11/2019   HDL 46.60 12/11/2019   LDLCALC 52 12/11/2019   LDLDIRECT 103.9 11/05/2013   TRIG 65.0 12/11/2019   CHOLHDL 2 12/11/2019    Hepatic Function Latest Ref Rng & Units 05/21/2021 12/30/2020 06/23/2020  Total Protein 6.5 - 8.1 g/dL 7.2 7.5 7.9  Albumin 3.5 - 5.0 g/dL 3.4(L) 3.9 4.2  AST 15 - 41 U/L 42(H) 41(H) 47(H)  ALT 0 - 44 U/L 29 41 44  Alk Phosphatase 38 - 126 U/L 81 87 84  Total Bilirubin 0.3 - 1.2 mg/dL 0.8 0.8 0.6  Bilirubin, Direct 0.0 - 0.2 mg/dL 0.2 - -    Lab Results  Component Value Date/Time   TSH 1.81 11/05/2013 12:49 PM   TSH 0.90 02/16/2011 11:01 AM   FREET4 0.87 05/16/2015 09:41 AM   FREET4 0.7 02/05/2009 04:40 PM    CBC Latest Ref Rng & Units 06/19/2021 05/21/2021 12/30/2020  WBC 4.0 - 10.5 K/uL 5.6 6.4 6.2  Hemoglobin 13.0 - 17.0 g/dL 10.0(L) 10.3(L) 11.1(L)  Hematocrit 39.0 - 52.0 % 30.3(L) 30.7(L) 32.7(L)  Platelets 150 - 400 K/uL 87(L) PLATELET CLUMPS NOTED ON SMEAR, UNABLE TO ESTIMATE 99.0(L)    No results found for: VD25OH  Clinical ASCVD: Yes  The ASCVD Risk score (Goff DC Jr., et al., 2013) failed to calculate for the following reasons:   The valid total cholesterol range is 130 to 320 mg/dL     Social History   Tobacco Use  Smoking Status Former   Types: Cigarettes   Quit date: 11/30/1983   Years since quitting: 37.5  Smokeless Tobacco Never   BP Readings from Last 3 Encounters:  06/19/21 (!) 161/87  06/04/21 108/72  05/22/21 100/62   Pulse  Readings from Last 3 Encounters:  06/19/21 77  06/04/21 67  05/22/21 77   Wt Readings from Last 3 Encounters:  06/19/21 169 lb 15.6 oz (77.1 kg)  06/04/21 170 lb 9.6 oz (77.4 kg)  05/22/21 177 lb (80.3 kg)   BMI Readings from Last 3 Encounters:  06/19/21 25.10 kg/m  06/04/21 25.19 kg/m  05/22/21 26.14 kg/m    Assessment/Interventions: Review of patient past medical history, allergies, medications, health status, including review of consultants reports, laboratory and other test data, was performed as part of comprehensive evaluation and provision of chronic care management services.   SDOH:  (Social Determinants of Health) assessments and interventions performed: Yes  SDOH Screenings   Alcohol Screen: Not on file  Depression (PHQ2-9): Not on file  Financial Resource Strain: Not on file  Food Insecurity: Not on file  Housing: Not on file  Physical Activity: Not on file  Social Connections: Not on file  Stress: Not on file  Tobacco Use: Medium Risk   Smoking Tobacco Use: Former   Smokeless Tobacco Use: Never  Transportation Needs: Not on file    CCM Care Plan  No Known Allergies   Medications Reviewed Today     Reviewed by Adams, Michelle, RPH (Pharmacist) on 06/19/21 at 1403  Med List Status: <None>   Medication Order Taking? Sig Documenting Provider Last Dose Status Informant  ALPHA LIPOIC ACID PO [id number] Yes Take 1 tablet by mouth daily. [provider] Taking Active Self  Ascorbic Acid (VITAMIN C) 1000 MG tablet [id number] Yes Take 1,000 mg by mouth daily. [provider] Taking Active Self  Coenzyme Q10 (CO Q 10) 100 MG CAPS [id number] Yes Take 100 mg by mouth daily. [provider] Taking Active Self  ferrous sulfate 325 (65 FE) MG tablet [id number] Yes Take 325 mg by mouth daily with breakfast. [provider] Taking Active Self  furosemide (LASIX) 20 MG tablet [id number] Yes Take 1 tablet by mouth once daily Jacobs, Daniel  P, MD Taking Active Self  glucose blood (ONE TOUCH ULTRA TEST) test strip [id number] Yes Use to test blood sugar 2 times daily as instructed. Dx: E11.59, E11.65 Gherghe, Cristina, MD Taking Active Self  insulin lispro (HUMALOG KWIKPEN) 200 UNIT/ML KwikPen [id number] Yes INJECT 14-16 UNITS INTO THE SKIN THREE TIMES DAILY BEFORE MEALS  Patient taking differently: Inject 14-16 Units into the skin 3 (three) times daily before meals. INJECT 14-16 UNITS INTO THE SKIN THREE TIMES DAILY BEFORE MEALS   Gherghe, Cristina, MD Taking Active Self  insulin NPH Human (NOVOLIN N RELION) 100 UNIT/ML injection [id number] Yes Inject under skin 30 units in a.m. and 35-40 units at bedtime  Patient taking differently: Inject 30-40 Units into the skin See admin instructions. Inject   under skin 30 units in a.m. and 40 units at bedtime   Gherghe, Cristina, MD Taking Active Self  Insulin Pen Needle 32G X 4 MM MISC [id number] Yes Use 4x a day - with insulin Gherghe, Cristina, MD Taking Active Self  Insulin Syringe-Needle U-100 (RELION INSULIN SYRINGE) 31G X 15/64" 0.5 ML MISC [id number] Yes USE  TWICE DAILY Gherghe, Cristina, MD Taking Active Self  losartan (COZAAR) 50 MG tablet [id number] Yes Take 1 tablet (50 mg total) by mouth daily. Please make yearly appt with Dr. Cooper for October 2022 for future refills. Thank you 1st attempt Walker, Caitlin S, NP Taking Active Self  metFORMIN (GLUCOPHAGE) 1000 MG tablet [id number] Yes TAKE 1 TABLET BY MOUTH TWICE DAILY WITH MEALS Gherghe, Cristina, MD Taking Active   Multiple Vitamin (MULTIVITAMIN WITH MINERALS) TABS tablet [id number] Yes Take 1 tablet by mouth at bedtime. [provider] Taking Active Self  ONETOUCH DELICA LANCETS FINE MISC [id number] Yes Use to test blood sugar 2 times daily as instructed. Dx: E11.59, E11.65 Gherghe, Cristina, MD Taking Active Self  OVER THE COUNTER MEDICATION [id number] Yes Take 2 capsules by mouth daily. Liver Support [provider] Taking  Active Self  pantoprazole (PROTONIX) 40 MG tablet [id number] Yes Take 1 tablet by mouth twice daily Letvak, Richard I, MD Taking Active   PARoxetine (PAXIL) 20 MG tablet [id number] Yes Take 2 tablets (40 mg total) by mouth daily. Letvak, Richard I, MD Taking Active Self  Polyethyl Glycol-Propyl Glycol (SYSTANE) 0.4-0.3 % SOLN [id number] Yes Place 2 drops into both eyes daily. [provider] Taking Active Self  propranolol (INDERAL) 80 MG tablet [id number] Yes Take 1 tablet (80 mg total) by mouth 2 (two) times daily. Jacobs, Daniel P, MD Taking Active Self  rosuvastatin (CRESTOR) 10 MG tablet [id number] Yes Take 1 tablet by mouth once daily Letvak, Richard I, MD Taking Active Self  Semaglutide,0.25 or 0.5MG/DOS, (OZEMPIC, 0.25 OR 0.5 MG/DOSE,) 2 MG/1.5ML SOPN [id number] Yes Inject 0.5 mg into the skin once a week. Gherghe, Cristina, MD Taking Active Self           Med Note (BLAKLEY, MELISSA R   Fri May 15, 2021  5:30 PM) Sunday  sildenafil (VIAGRA) 100 MG tablet [id number] Yes TAKE 1 TABLET BY MOUTH ONCE DAILY AS NEEDED FOR ERECTILE DYSFUNCTION Letvak, Richard I, MD Taking Active Self  spironolactone (ALDACTONE) 50 MG tablet [id number] Yes Take 1 tablet by mouth once daily Jacobs, Daniel P, MD Taking Active   traMADol (ULTRAM) 50 MG tablet [id number] Yes Take 50-100 mg by mouth 4 (four) times daily as needed for severe pain. [provider] Taking Active Self  triamcinolone (KENALOG) 0.1 % [id number] Yes Apply 1 application topically 2 (two) times daily as needed. 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) 06/19/2021   Actinic keratoses 05/22/2021   BPH associated with nocturia 06/10/2020   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HCC) 12/11/2019   Hip pain 08/07/2019   History of esophageal varices 09/15/2018   Alcoholic  cirrhosis of liver with ascites (HCC)    Secondary esophageal varices without bleeding (HCC)    Anemia 08/31/2018   Overweight 09/13/2017   Poorly controlled type 2 diabetes mellitus with circulatory disorder (HCC) 01/18/2017   Medial epicondylitis of elbow, left 08/03/2016   Elevated liver   function tests 07/13/2016   Thrombocytopenia (HCC) 07/13/2016   Trigger middle finger of left hand 03/02/2016   Bilateral hand pain 12/02/2015   Primary osteoarthritis of both first carpometacarpal joints 12/02/2015   Advance directive discussed with patient 05/16/2015   Routine general medical examination at a health care facility 02/16/2011   Erectile dysfunction 02/16/2011   Essential hypertension 12/21/2010   Hyperlipidemia LDL goal <70 06/24/2009   Osteoarthritis, multiple sites 04/16/2009   CAD (coronary artery disease) 02/04/2009   Major depressive disorder, recurrent episode (HCC) 08/21/2008   ALLERGIC RHINITIS 08/21/2008   Sleep apnea 08/21/2008    Immunization History  Administered Date(s) Administered   Fluad Quad(high Dose 65+) 08/07/2019, 10/02/2020   Hep A / Hep B 04/02/2020, 04/09/2020, 05/01/2020   Influenza Split 08/30/2011, 09/18/2012   Influenza Whole 08/21/2008, 10/02/2009, 08/18/2010   Influenza, High Dose Seasonal PF 08/29/2018   Influenza,inj,Quad PF,6+ Mos 11/05/2013, 09/12/2015, 08/27/2016, 09/02/2017   PFIZER(Purple Top)SARS-COV-2 Vaccination 01/28/2020, 02/28/2020   Pneumococcal Conjugate-13 10/09/2014   Pneumococcal Polysaccharide-23 02/11/2010, 01/18/2017   Td 11/29/2002   Tdap 11/05/2013    Conditions to be addressed/monitored:  Hypertension and Diabetes   Patient Care Plan: CCM Pharmacy Care Plan     Problem Identified: CHL AMB "PATIENT-SPECIFIC PROBLEM"      Long-Range Goal: Disease Management   Start Date: 06/08/2021  Priority: High  Note:   Current Barriers:  None identified  Pharmacist Clinical Goal(s):  Patient will contact provider office  for questions/concerns as evidenced notation of same in electronic health record through collaboration with PharmD and provider.   Interventions: 1:1 collaboration with Letvak, Richard I, MD regarding development and update of comprehensive plan of care as evidenced by provider attestation and co-signature Inter-disciplinary care team collaboration (see longitudinal plan of care) Comprehensive medication review performed; medication list updated in electronic medical record  Hypertension (BP goal <140/90) -Controlled - Losartan reduced 06/04/21 per cardiology due to hypotension. Pt reports feeling better but has not checked home BP. He has had several recent falls prior to dose reduction. He feels unsteady and contributes this to neuropathy/numbness. -Current treatment: Losartan 50 mg - 1 tablet daily -Pt is on the following BP lowering medications for other indications: cirrhosis - spironolactone, propranolol, furosemide -Medications previously tried: none  -Current home readings: none, he plans to purchase a home BP monitor -Denies hypotensive/hypertensive symptoms -Educated on Importance of home blood pressure monitoring; -Counseled to monitor BP at home daily for 1 week, document, and provide log at future appointments -Recommended to continue current medication; CMA follow up for BP log.  Diabetes (A1c goal <7%) -Controlled - per A1c 6.8% -Managed by endocrinology -Current medications: Metformin 1000 mg - 1 tablet twice daily Ozempic 0.5 mg - Inject once weekly Insulin NPH  (Novolin N) - Inject 30 units am and 40 units at bedtime Insulin lispro(Humalog) - Inject 14-16 units before meals -Medications previously tried: none reported  -Current home glucose readings fasting glucose: none reported today post prandial glucose: none -Denies hypoglycemic/hyperglycemic symptoms -Educated on A1c and blood sugar goals; Prevention and management of hypoglycemic episodes; -Counseled to check  feet daily and get yearly eye exams -Recommended continue follow up with endocrinology. Please call with any cost concerns.   Neuropathy (Goal: Improve symptoms) -Uncontrolled - Reports bad neuropathy (pain and tingling in legs) gradually worse the past 3-4 months. Started going to chiropractor, helping some. Falling quite a bit. Very unsteady. Using a cane. Denies any treatment besides alpha lipoic acid for neuropathy. He has a microdiscectomy   scheduled and is hopeful this will help with his pain. -Current treatment  OTC Alpha lipoic acid - 1 tablet daily -Medications previously tried: none -Recommended to continue current medication  Patient Goals/Self-Care Activities Patient will:  - contact team with health concerns  Follow Up Plan: Telephone follow up appointment with care management team member scheduled for:  6 months    Medication Assistance: None required.  Patient affirms current coverage meets needs.  Compliance/Adherence/Medication fill history: Care Gaps: UACR - diabetes followed by endo, seen in past 6 months Diabetic foot exam - completed 08/2020 Diabetic eye exam - completed 11/2019, due 11/2021 Covid vaccines Shingrix vaccines  Star-Rating Drugs: Losartan 15m            05/14/21            90ds Rosuvastatin 163m    06/09/21            90ds Metformin 100028m    06/09/21            90ds Ozempic                      06/01/21              28ds  No gaps in adherence  [facility] [place]: [phone]: [phone] pill box? No - has not needed one yet Pt endorses 100% compliance  We discussed: Current pharmacy is preferred with insurance plan and patient is satisfied with pharmacy services Patient decided to: Continue current medication management strategy  Care Plan and Follow Up Patient Decision:  Patient agrees to Care Plan and  Follow-up.  MicDebbora DusharmD Clinical Pharmacist [phone]

## 2021-06-09 ENCOUNTER — Other Ambulatory Visit: Payer: Self-pay | Admitting: Internal Medicine

## 2021-06-16 ENCOUNTER — Other Ambulatory Visit (HOSPITAL_COMMUNITY)
Admission: RE | Admit: 2021-06-16 | Discharge: 2021-06-16 | Disposition: A | Discharge status: Home / Self Care | Payer: Medicare Other | Source: Ambulatory Visit | Attending: Neurosurgery | Admitting: Neurosurgery

## 2021-06-16 DIAGNOSIS — Z20822 Contact with and (suspected) exposure to covid-19: Secondary | ICD-10-CM | POA: Insufficient documentation

## 2021-06-16 DIAGNOSIS — Z01812 Encounter for preprocedural laboratory examination: Secondary | ICD-10-CM | POA: Insufficient documentation

## 2021-06-16 LAB — SARS CORONAVIRUS 2 (TAT 6-24 HRS): SARS Coronavirus 2: NEGATIVE

## 2021-06-18 ENCOUNTER — Other Ambulatory Visit: Payer: Self-pay

## 2021-06-18 ENCOUNTER — Encounter (HOSPITAL_COMMUNITY): Payer: Self-pay | Admitting: Neurosurgery

## 2021-06-18 NOTE — Progress Notes (Signed)
PCP - Dr. Silvio Pate Cardiologist - Dr. Burt Knack EKG - 06/04/21 Chest x-ray -  ECHO -  Cardiac Cath -  CPAP - pt has OSA, used to wear CPAP for "20 ish years", but does not wear CPAP anymore - no reason for stopping his CPAP, just does not wear it.   Fasting Blood Sugar:  130-140 Checks Blood Sugar:  1x/day   COVID TEST- 7/19 negative   Anesthesia review: yes - med hx  -------------  SDW INSTRUCTIONS:  Your procedure is scheduled on 7/22 Friday. Please report to [facility] Main Entrance "A" at 0530 A.M., and check in at the Admitting office. Call this number if you have problems the morning of surgery: [phone]   Remember: Do not eat or drink after midnight the night before your surgery    Medications to take morning of surgery with a sip of water include: pantoprazole (PROTONIX) PARoxetine (PAXIL) propranolol (INDERAL)  rosuvastatin (CRESTOR)  traMADol (ULTRAM) if needed  ** PLEASE check your blood sugar the morning of your surgery when you wake up and every 2 hours until you get to the Short Stay unit.  If your blood sugar is less than 70 mg/dL, you will need to treat for low blood sugar: Do not take insulin. Treat a low blood sugar (less than 70 mg/dL) with  cup of clear juice (cranberry or apple), 4 glucose tablets, OR glucose gel. Recheck blood sugar in 15 minutes after treatment (to make sure it is greater than 70 mg/dL). If your blood sugar is not greater than 70 mg/dL on recheck, call [phone] for further instructions.  insulin lispro (HUMALOG KWIKPEN) 7/21 - AM take as usual, PM none 7/22 - none - but take 1/2 dose if CBG >220  insulin NPH Human (NOVOLIN N RELION) 7/21 - AM usual 30 units, PM 20 units 7/22 - 15 units  metFORMIN (GLUCOPHAGE) - take as usual, NONE DOS  Semaglutide  - NONE DOS    As of today, STOP taking any Aspirin (unless otherwise instructed by your surgeon), Aleve, Naproxen, Ibuprofen, Motrin, Advil, Goody's, BC's, all herbal  medications, fish oil, and all vitamins.    The Morning of Surgery Do not wear jewelry Do not wear lotions, powders, colognes, or deodorant Men may shave face and neck. Do not bring valuables to the hospital. [facility] is not responsible for any belongings or valuables.  If you are a smoker, DO NOT Smoke 24 hours prior to surgery If you wear a CPAP at night please bring your mask the morning of surgery  Remember that you must have someone to transport you home after your surgery, and remain with you for 24 hours if you are discharged the same day.  Please bring cases for contacts, glasses, hearing aids, dentures or bridgework because it cannot be worn into surgery.   Patients discharged the day of surgery will not be allowed to drive home.   Please shower the NIGHT BEFORE/MORNING OF SURGERY (use antibacterial soap like DIAL soap if possible). Wear comfortable clothes the morning of surgery. Oral Hygiene is also important to reduce your risk of infection.  Remember - BRUSH YOUR TEETH THE MORNING OF SURGERY WITH YOUR REGULAR TOOTHPASTE  Patient denies shortness of breath, fever, cough and chest pain.

## 2021-06-18 NOTE — Anesthesia Preprocedure Evaluation (Addendum)
Anesthesia Evaluation  Patient identified by MRN, date of birth, ID band Patient awake    Reviewed: Allergy & Precautions, NPO status , Patient's Chart, lab work & pertinent test results, reviewed documented beta blocker date and time   History of Anesthesia Complications Negative for: history of anesthetic complications  Airway Mallampati: II  TM Distance: >3 FB Neck ROM: Full    Dental  (+) Missing,    Pulmonary sleep apnea and Continuous Positive Airway Pressure Ventilation , former smoker,    Pulmonary exam normal        Cardiovascular hypertension, Pt. on medications and Pt. on home beta blockers + CAD, + Past MI and + Cardiac Stents (2010)  Normal cardiovascular exam  TTE 2019: EF 60-65%, ascending aorta mildly dilated, moderate LAE, moderate RVE, mild RAE, PASP 32 mmHg   Neuro/Psych Depression Thoracic spondylosis with myelopathy    GI/Hepatic GERD  Medicated and Controlled,(+) Cirrhosis   Esophageal Varices    ,   Endo/Other  diabetes, Type 2, Insulin Dependent, Oral Hypoglycemic Agents  Renal/GU negative Renal ROS  negative genitourinary   Musculoskeletal  (+) Arthritis ,   Abdominal   Peds  Hematology negative hematology ROS (+)   Anesthesia Other Findings Day of surgery medications reviewed with patient.  Reproductive/Obstetrics negative OB ROS                           Anesthesia Physical Anesthesia Plan  ASA: 3  Anesthesia Plan: General   Post-op Pain Management:    Induction: Intravenous  PONV Risk Score and Plan: 3 and Treatment may vary due to age or medical condition, Ondansetron, Dexamethasone and Midazolam  Airway Management Planned: Oral ETT  Additional Equipment: None  Intra-op Plan:   Post-operative Plan: Extubation in OR  Informed Consent: I have reviewed the patients History and Physical, chart, labs and discussed the procedure including the risks,  benefits and alternatives for the proposed anesthesia with the patient or authorized representative who has indicated his/her understanding and acceptance.     Dental advisory given  Plan Discussed with: CRNA  Anesthesia Plan Comments: (PAT note by Karoline Caldwell, PA-C: Follows with cardiology for hx of coronary artery disease with two-vessel PCI in 2010, hypertension, hyperlipidemia. 2019 he presented shortness of breath and chest discomfort. Stress test was abnormal. He was taken for cardiac catheterization but hemoglobin was 5.6. Ultimately diagnosed with cirrhosis, grade 2 esophageal varices, nonbleeding gastric ulcer. Beta-blocker was changed to nadolol. He underwent Cardiac catheterization showing patent stents in circumflex and RCA with only mild restenosis. He was recommended for medical therapy. Last seen 06/04/21 for preop clearance. Per note,   Hx of alcoholic cirrhosis with secondary esophageal varices. Followed by Dr. Ardis Hughs. Last endoscopy 04/19/20 showed Several trunks of large (> 5 mm) varices were found in the lower third of the esophagus. None with signs of recent or impending bleeding. Moderate to severe changes of portal gastropathy. No gastric varices. The previously noted gastic ulcers have healed. Recommended increase propanolol. Last seen by Dr. Ardis Hughs 12/30/20. Per note, "First he is still drinking alcohol, 4-6 beers every other day or so. We discussed this again. He knows that it is quite detrimental to his health and longevity. He will continue to do the best he can to try to quit. He has large esophageal varices and is on nonselective beta-blocker. Current propranolol dose 60 twice daily he clearly has room to increase this based on heart rate and blood  pressure today. We will give him a new prescription for 80 mg propranolol which she will take twice daily and he will return for a nurse visit in about 2 weeks for blood pressure and heart rate check." MELD score 10 by labs  12/30/20.  Hx of OSA, pt reports he does not wear CPAP.  DMII well controlled, last A1c 6.8 on 05/21/21.  CMP and CBC from 05/21/21 reviewed. Anemia with Hgb 10.3 (appears chronic). Labs otherwise unremarkable.   EKG 06/04/21: NSR. Rate 67.  Cath 08/31/18: 1. Patent stents in the left circumflex with mild restenosis 2. Patent stents in the RCA with mild in stent restenosis 3. Widely patent left main and LAD 4. Mildly elevated LVEDP  Stable coronary anatomy without high-grade coronary stenosis. Medical therapy for CAD. OK to proceed with GI evaluation as needed.  TTE 08/28/2018: - Left ventricle: The cavity size was normal. Systolic function was  normal. The estimated ejection fraction was in the range of 60%  to 65%. Wall motion was normal; there were no regional wall  motion abnormalities.  - Aortic valve: There was no regurgitation.  - Aorta: Ascending aortic diameter: 40 mm (S).  - Ascending aorta: The ascending aorta was mildly dilated.  - Mitral valve: There was trivial regurgitation.  - Left atrium: The atrium was moderately dilated.  - Right ventricle: The cavity size was moderately dilated. Wall  thickness was normal. Systolic function was normal.  - Right atrium: The atrium was mildly dilated.  - Atrial septum: No defect or patent foramen ovale was identified.  - Tricuspid valve: There was trivial regurgitation.  - Pulmonic valve: There was no significant regurgitation.  - Pulmonary arteries: PA peak pressure: 32 mm Hg (S).   Impressions:   - Normal to hyperdynamic LV function, no regional wall motion  abnormalities. Mildly dilated ascending aorta. Moderately dilated  RV with normal to hyperdynamic function.  )      Anesthesia Quick Evaluation

## 2021-06-18 NOTE — Progress Notes (Signed)
Anesthesia Chart Review: Same day workup  Follows with cardiology for hx of coronary artery disease with two-vessel PCI in 2010, hypertension, hyperlipidemia. 2019 he presented shortness of breath and chest discomfort.  Stress test was abnormal.  He was taken for cardiac catheterization but hemoglobin was 5.6.  Ultimately diagnosed with cirrhosis, grade 2 esophageal varices, nonbleeding gastric ulcer.  Beta-blocker was changed to nadolol.  He underwent Cardiac catheterization showing patent stents in circumflex and RCA with only mild restenosis.  He was recommended for medical therapy. Last seen 06/04/21 for preop clearance. Per note,   Hx of alcoholic cirrhosis with secondary esophageal varices. Followed by Dr. Ardis Hughs. Last endoscopy 04/19/20 showed Several trunks of large (> 5 mm) varices were found in the lower third of the esophagus. None with signs of recent or impending bleeding. Moderate to severe changes of portal gastropathy. No gastric varices. The previously noted gastic ulcers have healed. Recommended increase propanolol. Last seen by Dr. Ardis Hughs 12/30/20. Per note, "First he is still drinking alcohol, 4-6 beers every other day or so.  We discussed this again.  He knows that it is quite detrimental to his health and longevity.  He will continue to do the best he can to try to quit.  He has large esophageal varices and is on nonselective beta-blocker.  Current propranolol dose 60 twice daily he clearly has room to increase this based on heart rate and blood pressure today.  We will give him a new prescription for 80 mg propranolol which she will take twice daily and he will return for a nurse visit in about 2 weeks for blood pressure and heart rate check." MELD score 10 by labs 12/30/20.  Hx of OSA, pt reports he does not wear CPAP.  DMII well controlled, last A1c 6.8 on 05/21/21.  CMP and CBC from 05/21/21 reviewed. Anemia with Hgb 10.3 (appears chronic). Labs otherwise unremarkable.   EKG 06/04/21:  NSR. Rate 67.  Cath 08/31/18: 1. Patent stents in the left circumflex with mild restenosis 2. Patent stents in the RCA with mild in stent restenosis 3. Widely patent left main and LAD 4. Mildly elevated LVEDP   Stable coronary anatomy without high-grade coronary stenosis. Medical therapy for CAD. OK to proceed with GI evaluation as needed.  TTE 08/28/2018: - Left ventricle: The cavity size was normal. Systolic function was    normal. The estimated ejection fraction was in the range of 60%    to 65%. Wall motion was normal; there were no regional wall    motion abnormalities.  - Aortic valve: There was no regurgitation.  - Aorta: Ascending aortic diameter: 40 mm (S).  - Ascending aorta: The ascending aorta was mildly dilated.  - Mitral valve: There was trivial regurgitation.  - Left atrium: The atrium was moderately dilated.  - Right ventricle: The cavity size was moderately dilated. Wall    thickness was normal. Systolic function was normal.  - Right atrium: The atrium was mildly dilated.  - Atrial septum: No defect or patent foramen ovale was identified.  - Tricuspid valve: There was trivial regurgitation.  - Pulmonic valve: There was no significant regurgitation.  - Pulmonary arteries: PA peak pressure: 32 mm Hg (S).   Impressions:   - Normal to hyperdynamic LV function, no regional wall motion    abnormalities. Mildly dilated ascending aorta. Moderately dilated    RV with normal to hyperdynamic function.    David [name], [facility]/Anesthesiology Phone [phone] 06/18/2021 12:38 PM

## 2021-06-19 ENCOUNTER — Telehealth: Payer: Self-pay

## 2021-06-19 ENCOUNTER — Ambulatory Visit (HOSPITAL_COMMUNITY): Payer: Medicare Other | Admitting: Physician Assistant

## 2021-06-19 ENCOUNTER — Ambulatory Visit (HOSPITAL_COMMUNITY): Payer: Medicare Other

## 2021-06-19 ENCOUNTER — Encounter (HOSPITAL_COMMUNITY): Payer: Self-pay | Admitting: Neurosurgery

## 2021-06-19 ENCOUNTER — Observation Stay (HOSPITAL_COMMUNITY)
Admission: RE | Admit: 2021-06-19 | Discharge: 2021-06-20 | Disposition: A | Discharge status: Home / Self Care | Payer: Medicare Other | Attending: Neurosurgery | Admitting: Neurosurgery

## 2021-06-19 ENCOUNTER — Encounter (HOSPITAL_COMMUNITY)
Admission: RE | Disposition: A | Discharge status: Home / Self Care | Payer: Self-pay | Source: Home / Self Care | Attending: Neurosurgery

## 2021-06-19 DIAGNOSIS — Z419 Encounter for procedure for purposes other than remedying health state, unspecified: Secondary | ICD-10-CM

## 2021-06-19 DIAGNOSIS — I2510 Atherosclerotic heart disease of native coronary artery without angina pectoris: Secondary | ICD-10-CM | POA: Insufficient documentation

## 2021-06-19 DIAGNOSIS — Z87891 Personal history of nicotine dependence: Secondary | ICD-10-CM | POA: Insufficient documentation

## 2021-06-19 DIAGNOSIS — Z7984 Long term (current) use of oral hypoglycemic drugs: Secondary | ICD-10-CM | POA: Insufficient documentation

## 2021-06-19 DIAGNOSIS — I10 Essential (primary) hypertension: Secondary | ICD-10-CM | POA: Insufficient documentation

## 2021-06-19 DIAGNOSIS — K7031 Alcoholic cirrhosis of liver with ascites: Secondary | ICD-10-CM | POA: Diagnosis not present

## 2021-06-19 DIAGNOSIS — Z79899 Other long term (current) drug therapy: Secondary | ICD-10-CM | POA: Diagnosis not present

## 2021-06-19 DIAGNOSIS — Z955 Presence of coronary angioplasty implant and graft: Secondary | ICD-10-CM | POA: Diagnosis not present

## 2021-06-19 DIAGNOSIS — E119 Type 2 diabetes mellitus without complications: Secondary | ICD-10-CM | POA: Diagnosis not present

## 2021-06-19 DIAGNOSIS — M7138 Other bursal cyst, other site: Secondary | ICD-10-CM | POA: Insufficient documentation

## 2021-06-19 DIAGNOSIS — Z794 Long term (current) use of insulin: Secondary | ICD-10-CM | POA: Insufficient documentation

## 2021-06-19 DIAGNOSIS — G992 Myelopathy in diseases classified elsewhere: Secondary | ICD-10-CM

## 2021-06-19 DIAGNOSIS — M4714 Other spondylosis with myelopathy, thoracic region: Secondary | ICD-10-CM | POA: Diagnosis not present

## 2021-06-19 DIAGNOSIS — M4804 Spinal stenosis, thoracic region: Secondary | ICD-10-CM | POA: Diagnosis present

## 2021-06-19 DIAGNOSIS — M5104 Intervertebral disc disorders with myelopathy, thoracic region: Secondary | ICD-10-CM | POA: Diagnosis not present

## 2021-06-19 DIAGNOSIS — Z9889 Other specified postprocedural states: Secondary | ICD-10-CM | POA: Diagnosis not present

## 2021-06-19 DIAGNOSIS — I8510 Secondary esophageal varices without bleeding: Secondary | ICD-10-CM | POA: Diagnosis not present

## 2021-06-19 HISTORY — PX: THORACIC DISCECTOMY: SHX6113

## 2021-06-19 LAB — CBC
HCT: 30.3 % — ABNORMAL LOW (ref 39.0–52.0)
Hemoglobin: 10 g/dL — ABNORMAL LOW (ref 13.0–17.0)
MCH: 34.1 pg — ABNORMAL HIGH (ref 26.0–34.0)
MCHC: 33 g/dL (ref 30.0–36.0)
MCV: 103.4 fL — ABNORMAL HIGH (ref 80.0–100.0)
Platelets: 87 10*3/uL — ABNORMAL LOW (ref 150–400)
RBC: 2.93 MIL/uL — ABNORMAL LOW (ref 4.22–5.81)
RDW: 15 % (ref 11.5–15.5)
WBC: 5.6 10*3/uL (ref 4.0–10.5)
nRBC: 0 % (ref 0.0–0.2)

## 2021-06-19 LAB — GLUCOSE, CAPILLARY
Glucose-Capillary: 152 mg/dL — ABNORMAL HIGH (ref 70–99)
Glucose-Capillary: 102 mg/dL — ABNORMAL HIGH (ref 70–99)
Glucose-Capillary: 298 mg/dL — ABNORMAL HIGH (ref 70–99)
Glucose-Capillary: 214 mg/dL — ABNORMAL HIGH (ref 70–99)

## 2021-06-19 SURGERY — THORACIC DISCECTOMY
Anesthesia: General | Site: Back | Laterality: Right

## 2021-06-19 MED ORDER — INSULIN NPH (HUMAN) (ISOPHANE) 100 UNIT/ML ~~LOC~~ SUSP
30.0000 [IU] | Freq: Every day | SUBCUTANEOUS | Status: DC
  Administered 2021-06-20: 30 [IU] via SUBCUTANEOUS
  Filled 2021-06-19: qty 10

## 2021-06-19 MED ORDER — INSULIN NPH (HUMAN) (ISOPHANE) 100 UNIT/ML ~~LOC~~ SUSP
40.0000 [IU] | Freq: Every day | SUBCUTANEOUS | Status: DC
  Administered 2021-06-19: 40 [IU] via SUBCUTANEOUS

## 2021-06-19 MED ORDER — PROPRANOLOL HCL 80 MG PO TABS
80.0000 mg | ORAL_TABLET | Freq: Two times a day (BID) | ORAL | Status: DC
  Administered 2021-06-19 – 2021-06-20 (×2): 80 mg via ORAL
  Filled 2021-06-19 (×2): qty 1

## 2021-06-19 MED ORDER — PAROXETINE HCL 40 MG PO TABS: 40.0000 mg | ORAL_TABLET | ORAL | 3 refills | Status: DC

## 2021-06-19 MED ORDER — MIDAZOLAM HCL 2 MG/2ML IJ SOLN
INTRAMUSCULAR | Status: DC | PRN
  Administered 2021-06-19: 2 mg via INTRAVENOUS

## 2021-06-19 MED ORDER — CEFAZOLIN SODIUM-DEXTROSE 2-3 GM-%(50ML) IV SOLR
INTRAVENOUS | Status: DC | PRN
  Administered 2021-06-19: 2 g via INTRAVENOUS

## 2021-06-19 MED ORDER — SEMAGLUTIDE(0.25 OR 0.5MG/DOS) 2 MG/1.5ML ~~LOC~~ SOPN: 0.5000 mg | PEN_INJECTOR | SUBCUTANEOUS | Status: DC

## 2021-06-19 MED ORDER — PROMETHAZINE HCL 25 MG/ML IJ SOLN: 6.2500 mg | INTRAMUSCULAR | Status: DC | PRN

## 2021-06-19 MED ORDER — FENTANYL CITRATE (PF) 100 MCG/2ML IJ SOLN
INTRAMUSCULAR | Status: AC
  Administered 2021-06-19: 25 ug via INTRAVENOUS
  Filled 2021-06-19: qty 2

## 2021-06-19 MED ORDER — ALPHA LIPOIC ACID 200 MG PO CAPS: ORAL_CAPSULE | Freq: Every day | ORAL | Status: DC

## 2021-06-19 MED ORDER — PAROXETINE HCL 20 MG PO TABS
40.0000 mg | ORAL_TABLET | Freq: Every day | ORAL | Status: DC
  Administered 2021-06-19 – 2021-06-20 (×2): 40 mg via ORAL
  Filled 2021-06-19 (×2): qty 2

## 2021-06-19 MED ORDER — ONDANSETRON HCL 4 MG PO TABS: 4.0000 mg | ORAL_TABLET | Freq: Four times a day (QID) | ORAL | Status: DC | PRN

## 2021-06-19 MED ORDER — OXYCODONE HCL 5 MG PO TABS
ORAL_TABLET | ORAL | Status: AC
  Administered 2021-06-19: 5 mg via ORAL
  Filled 2021-06-19: qty 1

## 2021-06-19 MED ORDER — METFORMIN HCL 500 MG PO TABS
1000.0000 mg | ORAL_TABLET | Freq: Two times a day (BID) | ORAL | Status: DC
  Administered 2021-06-19 – 2021-06-20 (×2): 1000 mg via ORAL
  Filled 2021-06-19 (×2): qty 2

## 2021-06-19 MED ORDER — PHENYLEPHRINE 40 MCG/ML (10ML) SYRINGE FOR IV PUSH (FOR BLOOD PRESSURE SUPPORT)
PREFILLED_SYRINGE | INTRAVENOUS | Status: DC | PRN
Start: 2021-06-19 — End: 2021-06-19
  Administered 2021-06-19: 80 ug via INTRAVENOUS
  Administered 2021-06-19: 40 ug via INTRAVENOUS
  Administered 2021-06-19: 80 ug via INTRAVENOUS
  Administered 2021-06-19: 120 ug via INTRAVENOUS
  Administered 2021-06-19: 80 ug via INTRAVENOUS

## 2021-06-19 MED ORDER — THROMBIN 5000 UNITS EX SOLR
CUTANEOUS | Status: DC | PRN
  Administered 2021-06-19: 10000 [IU] via TOPICAL

## 2021-06-19 MED ORDER — SODIUM CHLORIDE 0.9% FLUSH: 3.0000 mL | INTRAVENOUS | Status: DC | PRN

## 2021-06-19 MED ORDER — THROMBIN 5000 UNITS EX SOLR
CUTANEOUS | Status: AC
  Filled 2021-06-19: qty 5000

## 2021-06-19 MED ORDER — ACETAMINOPHEN 325 MG PO TABS: 650.0000 mg | ORAL_TABLET | ORAL | Status: DC | PRN

## 2021-06-19 MED ORDER — HYDROCODONE-ACETAMINOPHEN 10-325 MG PO TABS
1.0000 | ORAL_TABLET | ORAL | Status: DC | PRN
  Administered 2021-06-19 – 2021-06-20 (×5): 2 via ORAL
  Filled 2021-06-19 (×5): qty 2

## 2021-06-19 MED ORDER — TRAMADOL HCL 50 MG PO TABS: 50.0000 mg | ORAL_TABLET | Freq: Four times a day (QID) | ORAL | Status: DC | PRN

## 2021-06-19 MED ORDER — SUGAMMADEX SODIUM 200 MG/2ML IV SOLN
INTRAVENOUS | Status: DC | PRN
  Administered 2021-06-19: 200 mg via INTRAVENOUS

## 2021-06-19 MED ORDER — SODIUM CHLORIDE 0.9 % IV SOLN
250.0000 mL | INTRAVENOUS | Status: DC
  Administered 2021-06-19: 250 mL via INTRAVENOUS

## 2021-06-19 MED ORDER — KETOROLAC TROMETHAMINE 15 MG/ML IJ SOLN
30.0000 mg | Freq: Four times a day (QID) | INTRAMUSCULAR | Status: AC
  Administered 2021-06-19 – 2021-06-20 (×4): 30 mg via INTRAVENOUS
  Filled 2021-06-19 (×4): qty 2

## 2021-06-19 MED ORDER — HEMOSTATIC AGENTS (NO CHARGE) OPTIME
TOPICAL | Status: DC | PRN
  Administered 2021-06-19: 1 via TOPICAL

## 2021-06-19 MED ORDER — CYCLOBENZAPRINE HCL 10 MG PO TABS
ORAL_TABLET | ORAL | Status: AC
  Filled 2021-06-19: qty 1

## 2021-06-19 MED ORDER — ONDANSETRON HCL 4 MG/2ML IJ SOLN: 4.0000 mg | Freq: Four times a day (QID) | INTRAMUSCULAR | Status: DC | PRN

## 2021-06-19 MED ORDER — KETOROLAC TROMETHAMINE 30 MG/ML IJ SOLN
INTRAMUSCULAR | Status: DC | PRN
  Administered 2021-06-19: 30 mg via INTRAVENOUS

## 2021-06-19 MED ORDER — ADULT MULTIVITAMIN W/MINERALS CH
1.0000 | ORAL_TABLET | Freq: Every day | ORAL | Status: DC
  Administered 2021-06-19: 1 via ORAL
  Filled 2021-06-19: qty 1

## 2021-06-19 MED ORDER — ONDANSETRON HCL 4 MG/2ML IJ SOLN
INTRAMUSCULAR | Status: DC | PRN
  Administered 2021-06-19: 4 mg via INTRAVENOUS

## 2021-06-19 MED ORDER — FUROSEMIDE 20 MG PO TABS
20.0000 mg | ORAL_TABLET | Freq: Every day | ORAL | Status: DC
  Administered 2021-06-19: 20 mg via ORAL
  Filled 2021-06-19: qty 1

## 2021-06-19 MED ORDER — ACETAMINOPHEN 650 MG RE SUPP: 650.0000 mg | RECTAL | Status: DC | PRN

## 2021-06-19 MED ORDER — LIDOCAINE 2% (20 MG/ML) 5 ML SYRINGE
INTRAMUSCULAR | Status: DC | PRN
  Administered 2021-06-19: 100 mg via INTRAVENOUS

## 2021-06-19 MED ORDER — CEFAZOLIN SODIUM-DEXTROSE 1-4 GM/50ML-% IV SOLN
1.0000 g | Freq: Three times a day (TID) | INTRAVENOUS | Status: AC
Start: 2021-06-19 — End: 2021-06-19
  Administered 2021-06-19 (×2): 1 g via INTRAVENOUS
  Filled 2021-06-19 (×2): qty 50

## 2021-06-19 MED ORDER — BUPIVACAINE HCL (PF) 0.25 % IJ SOLN
INTRAMUSCULAR | Status: DC | PRN
  Administered 2021-06-19: 20 mL

## 2021-06-19 MED ORDER — FENTANYL CITRATE (PF) 250 MCG/5ML IJ SOLN
INTRAMUSCULAR | Status: AC
  Filled 2021-06-19: qty 5

## 2021-06-19 MED ORDER — INSULIN ASPART 100 UNIT/ML IJ SOLN
14.0000 [IU] | Freq: Three times a day (TID) | INTRAMUSCULAR | Status: DC
  Administered 2021-06-19: 14 [IU] via SUBCUTANEOUS

## 2021-06-19 MED ORDER — OXYCODONE HCL 5 MG PO TABS: 5.0000 mg | ORAL_TABLET | Freq: Once | ORAL | Status: AC | PRN

## 2021-06-19 MED ORDER — MIDAZOLAM HCL 2 MG/2ML IJ SOLN
INTRAMUSCULAR | Status: AC
  Filled 2021-06-19: qty 2

## 2021-06-19 MED ORDER — PHENOL 1.4 % MT LIQD: 1.0000 | OROMUCOSAL | Status: DC | PRN

## 2021-06-19 MED ORDER — LOSARTAN POTASSIUM 50 MG PO TABS
50.0000 mg | ORAL_TABLET | Freq: Every day | ORAL | Status: DC
  Administered 2021-06-19 – 2021-06-20 (×2): 50 mg via ORAL
  Filled 2021-06-19 (×2): qty 1

## 2021-06-19 MED ORDER — HYDROMORPHONE HCL 1 MG/ML IJ SOLN: 1.0000 mg | INTRAMUSCULAR | Status: DC | PRN

## 2021-06-19 MED ORDER — ROSUVASTATIN CALCIUM 5 MG PO TABS
10.0000 mg | ORAL_TABLET | Freq: Every day | ORAL | Status: DC
  Administered 2021-06-19 – 2021-06-20 (×2): 10 mg via ORAL
  Filled 2021-06-19 (×2): qty 2

## 2021-06-19 MED ORDER — FERROUS SULFATE 325 (65 FE) MG PO TABS
325.0000 mg | ORAL_TABLET | Freq: Every day | ORAL | Status: DC
  Administered 2021-06-20: 325 mg via ORAL
  Filled 2021-06-19: qty 1

## 2021-06-19 MED ORDER — PHENYLEPHRINE HCL-NACL 10-0.9 MG/250ML-% IV SOLN
INTRAVENOUS | Status: DC | PRN
  Administered 2021-06-19: 25 ug/min via INTRAVENOUS

## 2021-06-19 MED ORDER — PANTOPRAZOLE SODIUM 40 MG PO TBEC
40.0000 mg | DELAYED_RELEASE_TABLET | Freq: Two times a day (BID) | ORAL | Status: DC
  Administered 2021-06-19 – 2021-06-20 (×3): 40 mg via ORAL
  Filled 2021-06-19 (×3): qty 1

## 2021-06-19 MED ORDER — SODIUM CHLORIDE 0.9% FLUSH
3.0000 mL | Freq: Two times a day (BID) | INTRAVENOUS | Status: DC
  Administered 2021-06-19 (×2): 3 mL via INTRAVENOUS

## 2021-06-19 MED ORDER — CHLORHEXIDINE GLUCONATE 0.12 % MT SOLN
15.0000 mL | Freq: Once | OROMUCOSAL | Status: AC
  Administered 2021-06-19: 15 mL via OROMUCOSAL
  Filled 2021-06-19: qty 15

## 2021-06-19 MED ORDER — ASCORBIC ACID 500 MG PO TABS
1000.0000 mg | ORAL_TABLET | Freq: Every day | ORAL | Status: DC
  Administered 2021-06-19 – 2021-06-20 (×2): 1000 mg via ORAL
  Filled 2021-06-19 (×2): qty 2

## 2021-06-19 MED ORDER — ORAL CARE MOUTH RINSE: 15.0000 mL | Freq: Once | OROMUCOSAL | Status: AC

## 2021-06-19 MED ORDER — PHENYLEPHRINE 40 MCG/ML (10ML) SYRINGE FOR IV PUSH (FOR BLOOD PRESSURE SUPPORT)
PREFILLED_SYRINGE | INTRAVENOUS | Status: AC
  Filled 2021-06-19: qty 10

## 2021-06-19 MED ORDER — CYCLOBENZAPRINE HCL 10 MG PO TABS
10.0000 mg | ORAL_TABLET | Freq: Three times a day (TID) | ORAL | Status: DC | PRN
  Administered 2021-06-19: 10 mg via ORAL

## 2021-06-19 MED ORDER — CO Q 10 100 MG PO CAPS: 100.0000 mg | ORAL_CAPSULE | Freq: Every day | ORAL | Status: DC

## 2021-06-19 MED ORDER — ROCURONIUM BROMIDE 10 MG/ML (PF) SYRINGE
PREFILLED_SYRINGE | INTRAVENOUS | Status: DC | PRN
  Administered 2021-06-19: 50 mg via INTRAVENOUS

## 2021-06-19 MED ORDER — PROPOFOL 10 MG/ML IV BOLUS
INTRAVENOUS | Status: AC
  Filled 2021-06-19: qty 40

## 2021-06-19 MED ORDER — LACTATED RINGERS IV SOLN: INTRAVENOUS | Status: DC

## 2021-06-19 MED ORDER — POLYVINYL ALCOHOL 1.4 % OP SOLN
2.0000 [drp] | Freq: Every day | OPHTHALMIC | Status: DC
  Filled 2021-06-19: qty 15

## 2021-06-19 MED ORDER — SPIRONOLACTONE 25 MG PO TABS
50.0000 mg | ORAL_TABLET | Freq: Every day | ORAL | Status: DC
  Administered 2021-06-19 – 2021-06-20 (×2): 50 mg via ORAL
  Filled 2021-06-19 (×2): qty 2

## 2021-06-19 MED ORDER — INSULIN ASPART 100 UNIT/ML IJ SOLN
0.0000 [IU] | Freq: Three times a day (TID) | INTRAMUSCULAR | Status: DC
  Administered 2021-06-19: 8 [IU] via SUBCUTANEOUS

## 2021-06-19 MED ORDER — THROMBIN 5000 UNITS EX SOLR
CUTANEOUS | Status: AC
  Filled 2021-06-19: qty 10000

## 2021-06-19 MED ORDER — FENTANYL CITRATE (PF) 250 MCG/5ML IJ SOLN
INTRAMUSCULAR | Status: DC | PRN
  Administered 2021-06-19: 100 ug via INTRAVENOUS
  Administered 2021-06-19: 50 ug via INTRAVENOUS

## 2021-06-19 MED ORDER — DEXAMETHASONE SODIUM PHOSPHATE 10 MG/ML IJ SOLN
INTRAMUSCULAR | Status: DC | PRN
  Administered 2021-06-19: 5 mg via INTRAVENOUS

## 2021-06-19 MED ORDER — MENTHOL 3 MG MT LOZG: 1.0000 | LOZENGE | OROMUCOSAL | Status: DC | PRN

## 2021-06-19 MED ORDER — PROPOFOL 10 MG/ML IV BOLUS
INTRAVENOUS | Status: DC | PRN
  Administered 2021-06-19: 130 mg via INTRAVENOUS
  Administered 2021-06-19: 20 mg via INTRAVENOUS

## 2021-06-19 MED ORDER — OXYCODONE HCL 5 MG/5ML PO SOLN
5.0000 mg | Freq: Once | ORAL | Status: AC | PRN
Start: 2021-06-19 — End: 2021-06-19

## 2021-06-19 MED ORDER — FENTANYL CITRATE (PF) 100 MCG/2ML IJ SOLN: 25.0000 ug | INTRAMUSCULAR | Status: DC | PRN

## 2021-06-19 MED ORDER — BUPIVACAINE HCL (PF) 0.25 % IJ SOLN
INTRAMUSCULAR | Status: AC
  Filled 2021-06-19: qty 30

## 2021-06-19 MED ORDER — 0.9 % SODIUM CHLORIDE (POUR BTL) OPTIME
TOPICAL | Status: DC | PRN
  Administered 2021-06-19: 1000 mL

## 2021-06-19 SURGICAL SUPPLY — 51 items
ADH SKN CLS APL DERMABOND .7 (GAUZE/BANDAGES/DRESSINGS) ×1
APL SKNCLS STERI-STRIP NONHPOA (GAUZE/BANDAGES/DRESSINGS) ×1
BAG COUNTER SPONGE SURGICOUNT (BAG) ×3 IMPLANT
BAG DECANTER FOR FLEXI CONT (MISCELLANEOUS) ×1 IMPLANT
BAG SPNG CNTER NS LX DISP (BAG) ×2
BAND INSRT 18 STRL LF DISP RB (MISCELLANEOUS) ×2
BAND RUBBER #18 3X1/16 STRL (MISCELLANEOUS) ×4 IMPLANT
BENZOIN TINCTURE PRP APPL 2/3 (GAUZE/BANDAGES/DRESSINGS) ×2 IMPLANT
BUR CUTTER 7.0 ROUND (BURR) ×2 IMPLANT
BUR MATCHSTICK NEURO 3.0 LAGG (BURR) IMPLANT
CANISTER SUCT 3000ML PPV (MISCELLANEOUS) ×2 IMPLANT
CARTRIDGE OIL MAESTRO DRILL (MISCELLANEOUS) ×1 IMPLANT
DECANTER SPIKE VIAL GLASS SM (MISCELLANEOUS) ×1 IMPLANT
DERMABOND ADVANCED (GAUZE/BANDAGES/DRESSINGS) ×1
DERMABOND ADVANCED .7 DNX12 (GAUZE/BANDAGES/DRESSINGS) ×1 IMPLANT
DIFFUSER DRILL AIR PNEUMATIC (MISCELLANEOUS) ×2 IMPLANT
DRAPE HALF SHEET 40X57 (DRAPES) ×1 IMPLANT
DRAPE LAPAROTOMY 100X72X124 (DRAPES) ×2 IMPLANT
DRAPE MICROSCOPE LEICA (MISCELLANEOUS) ×2 IMPLANT
DRAPE SURG 17X23 STRL (DRAPES) ×8 IMPLANT
DRSG OPSITE POSTOP 4X6 (GAUZE/BANDAGES/DRESSINGS) ×1 IMPLANT
ELECT REM PT RETURN 9FT ADLT (ELECTROSURGICAL) ×2
ELECTRODE REM PT RTRN 9FT ADLT (ELECTROSURGICAL) ×1 IMPLANT
GAUZE 4X4 16PLY ~~LOC~~+RFID DBL (SPONGE) ×1 IMPLANT
GAUZE SPONGE 4X4 12PLY STRL (GAUZE/BANDAGES/DRESSINGS) ×2 IMPLANT
GLOVE SURG ENC MOIS LTX SZ6.5 (GLOVE) ×2 IMPLANT
GLOVE SURG LTX SZ9 (GLOVE) ×2 IMPLANT
GLOVE SURG UNDER POLY LF SZ6.5 (GLOVE) ×2 IMPLANT
GOWN STRL REUS W/ TWL LRG LVL3 (GOWN DISPOSABLE) IMPLANT
GOWN STRL REUS W/ TWL XL LVL3 (GOWN DISPOSABLE) ×1 IMPLANT
GOWN STRL REUS W/TWL 2XL LVL3 (GOWN DISPOSABLE) IMPLANT
GOWN STRL REUS W/TWL LRG LVL3 (GOWN DISPOSABLE)
GOWN STRL REUS W/TWL XL LVL3 (GOWN DISPOSABLE) ×2
HEMOSTAT POWDER KIT SURGIFOAM (HEMOSTASIS) IMPLANT
KIT BASIN OR (CUSTOM PROCEDURE TRAY) ×2 IMPLANT
KIT TURNOVER KIT B (KITS) ×2 IMPLANT
NDL SPNL 22GX3.5 QUINCKE BK (NEEDLE) ×1 IMPLANT
NEEDLE HYPO 22GX1.5 SAFETY (NEEDLE) ×2 IMPLANT
NEEDLE SPNL 22GX3.5 QUINCKE BK (NEEDLE) ×2 IMPLANT
NS IRRIG 1000ML POUR BTL (IV SOLUTION) ×2 IMPLANT
OIL CARTRIDGE MAESTRO DRILL (MISCELLANEOUS) ×2
PACK LAMINECTOMY NEURO (CUSTOM PROCEDURE TRAY) ×2 IMPLANT
PAD ARMBOARD 7.5X6 YLW CONV (MISCELLANEOUS) ×6 IMPLANT
SPONGE SURGIFOAM ABS GEL SZ50 (HEMOSTASIS) ×1 IMPLANT
SPONGE T-LAP 4X18 ~~LOC~~+RFID (SPONGE) ×1 IMPLANT
STRIP CLOSURE SKIN 1/2X4 (GAUZE/BANDAGES/DRESSINGS) ×2 IMPLANT
SUT VIC AB 2-0 CT1 18 (SUTURE) ×2 IMPLANT
SUT VIC AB 3-0 SH 8-18 (SUTURE) ×2 IMPLANT
TOWEL GREEN STERILE (TOWEL DISPOSABLE) ×2 IMPLANT
TOWEL GREEN STERILE FF (TOWEL DISPOSABLE) ×2 IMPLANT
WATER STERILE IRR 1000ML POUR (IV SOLUTION) ×2 IMPLANT

## 2021-06-19 NOTE — H&P (Signed)
David [name] is an 70 y.o. male.   Chief Complaint: Weakness HPI: 70 year old male with bilateral lower extremity numbness paresthesias and weakness with difficulty walking.  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  Patient presents now for decompressive surgery in hopes of improving symptoms.  Past Medical History:  Diagnosis Date   Allergy    Anemia    Arthritis    Bilateral hands and feet   CAD (coronary artery disease)    s/p bifurcation stenting (DES x2) and stenting bifurcation lesion distal RCA (1 DES)   Cataract    Cirrhosis (HCC)    Depression    Diabetes mellitus    Esophageal varices (HCC)    GERD (gastroesophageal reflux disease)    Hyperlipemia    Hypertension    Myocardial infarction ([place])    Sleep apnea    CPAP    Past Surgical History:  Procedure Laterality Date   BIOPSY  09/01/2018   Procedure: BIOPSY;  Surgeon: Mauri Pole, MD;  Location: [facility];  Service: Endoscopy;;   CATARACT EXTRACTION Bilateral    CORONARY ANGIOPLASTY WITH STENT PLACEMENT   March 2010   Dr. Olevia Perches   ESOPHAGOGASTRODUODENOSCOPY (EGD) WITH PROPOFOL N/A 09/01/2018   Procedure: ESOPHAGOGASTRODUODENOSCOPY (EGD) WITH PROPOFOL;  Surgeon: Mauri Pole, MD;  Location: [facility] [facility];  Service: Endoscopy;  Laterality: N/A;   LEFT HEART CATH AND CORONARY ANGIOGRAPHY N/A 08/31/2018   Procedure: LEFT HEART CATH AND CORONARY ANGIOGRAPHY;  Surgeon: Sherren Mocha, MD;  Location: [place] [facility];  Service: Cardiovascular;  Laterality: N/A;   TONSILLECTOMY  1959    Family History  Problem Relation Age of Onset   Heart failure Father        Deceased 35 y/o   Diabetes Father    Cancer Father        Prostate   Heart failure Mother        Deceased 32 y/o   Alcohol abuse Mother    Schizophrenia Son        Paranoid   Uveitis Son    Cancer Brother        prostate   Colon cancer Neg Hx     Esophageal cancer Neg Hx    Stomach cancer Neg Hx    Liver cancer Neg Hx    Pancreatic cancer Neg Hx    Rectal cancer Neg Hx    Social History:  reports that he quit smoking about 37 years ago. His smoking use included cigarettes. He has never used smokeless tobacco. He reports current alcohol use of about 20.0 standard drinks of alcohol per week. He reports that he does not use drugs.  Allergies: No Known Allergies  Medications Prior to Admission  Medication Sig Dispense Refill   ALPHA LIPOIC ACID PO Take 1 tablet by mouth daily.     Ascorbic Acid (VITAMIN C) 1000 MG tablet Take 1,000 mg by mouth daily.     Coenzyme Q10 (CO Q 10) 100 MG CAPS Take 100 mg by mouth daily.     ferrous sulfate 325 (65 FE) MG tablet Take 325 mg by mouth daily with breakfast.     furosemide (LASIX) 20 MG tablet Take 1 tablet by mouth once daily 90 tablet 1   insulin lispro (HUMALOG KWIKPEN) 200 UNIT/ML KwikPen INJECT 14-16 UNITS INTO THE SKIN THREE TIMES DAILY BEFORE MEALS (Patient taking differently: Inject 14-16 Units into the skin 3 (three) times daily before meals.  INJECT 14-16 UNITS INTO THE SKIN THREE TIMES DAILY BEFORE MEALS) 30 mL 3   insulin NPH Human (NOVOLIN N RELION) 100 UNIT/ML injection Inject under skin 30 units in a.m. and 35-40 units at bedtime (Patient taking differently: Inject 30-40 Units into the skin See admin instructions. Inject under skin 30 units in a.m. and 40 units at bedtime) 20 mL 11   losartan (COZAAR) 50 MG tablet Take 1 tablet (50 mg total) by mouth daily. Please make yearly appt with Dr. Burt Knack for October 2022 for future refills. Thank you 1st attempt 90 tablet 3   metFORMIN (GLUCOPHAGE) 1000 MG tablet TAKE 1 TABLET BY MOUTH TWICE DAILY WITH MEALS 180 tablet 1   Multiple Vitamin (MULTIVITAMIN WITH MINERALS) TABS tablet Take 1 tablet by mouth at bedtime.     OVER THE COUNTER MEDICATION Take 2 capsules by mouth daily. Liver Support     pantoprazole (PROTONIX) 40 MG tablet Take 1  tablet by mouth twice daily 180 tablet 3   PARoxetine (PAXIL) 20 MG tablet Take 2 tablets (40 mg total) by mouth daily. 180 tablet 1   Polyethyl Glycol-Propyl Glycol (SYSTANE) 0.4-0.3 % SOLN Place 2 drops into both eyes daily.     propranolol (INDERAL) 80 MG tablet Take 1 tablet (80 mg total) by mouth 2 (two) times daily. 60 tablet 6   rosuvastatin (CRESTOR) 10 MG tablet Take 1 tablet by mouth once daily 90 tablet 1   Semaglutide,0.25 or 0.'5MG'$ /DOS, (OZEMPIC, 0.25 OR 0.5 MG/DOSE,) 2 MG/1.5ML SOPN Inject 0.5 mg into the skin once a week. 4.5 mL 3   spironolactone (ALDACTONE) 50 MG tablet Take 1 tablet by mouth once daily 90 tablet 1   traMADol (ULTRAM) 50 MG tablet Take 50-100 mg by mouth 4 (four) times daily as needed for severe pain.     triamcinolone (KENALOG) 0.1 % Apply 1 application topically 2 (two) times daily as needed. Apply to scalp     glucose blood (ONE TOUCH ULTRA TEST) test strip Use to test blood sugar 2 times daily as instructed. Dx: E11.59, E11.65 100 each 5   Insulin Pen Needle 32G X 4 MM MISC Use 4x a day - with insulin 200 each 3   Insulin Syringe-Needle U-100 (RELION INSULIN SYRINGE) 31G X 15/64" 0.5 ML MISC USE  TWICE DAILY 100 each 5   ONETOUCH DELICA LANCETS FINE MISC Use to test blood sugar 2 times daily as instructed. Dx: E11.59, E11.65 100 each 5   sildenafil (VIAGRA) 100 MG tablet TAKE 1 TABLET BY MOUTH ONCE DAILY AS NEEDED FOR ERECTILE DYSFUNCTION 10 tablet 1    Results for orders placed or performed during the hospital encounter of 06/19/21 (from the past 48 hour(s))  Glucose, capillary     Status: Abnormal   Collection Time: 06/19/21  5:51 AM  Result Value Ref Range   Glucose-Capillary 152 (H) 70 - 99 mg/dL    Comment: Glucose reference range applies only to samples taken after fasting for at least 8 hours.   No results found.  Pertinent items noted in HPI and remainder of comprehensive ROS otherwise negative.  Blood pressure (!) 149/77, pulse 81, temperature  98.8 F (37.1 C), temperature source Oral, resp. rate 18, height '5\' 9"'$  (1.753 m), weight 77.1 kg, SpO2 100 %.  Patient is awake and alert.  He is oriented and appropriate.  Speech is fluent.  Judgment insight are intact.  Cranial nerve function normal bilateral.  Motor examination extremities reveals 5/5 strength in both upper  extremities.  His lower extremities he has 4/5 strength with increased tone.  Sensory examination reveals sensory level around T12 bilaterally.  Deep nerves are hypoactive in both lower extremities and normal in his upper extremities.  Toes are equivocal to plantar stimulation.  Gait is abnormal.  Examination head ears eyes nose throat is unremarked.  Chest and abdomen are benign.  Extremities are free from injury deformity. Assessment/Plan T11-T12 stenosis with myelopathy.  Plan T11-T12 decompressive laminectomy with right T11-12 microdiscectomy.  Risks and benefits been explained.  Patient wishes to proceed.  Cooper Render Dal Blew 06/19/2021, 7:55 AM

## 2021-06-19 NOTE — Anesthesia Procedure Notes (Signed)
Procedure Name: Intubation Date/Time: 06/19/2021 8:13 AM Performed by: Carolan Clines, CRNA Pre-anesthesia Checklist: Patient identified, Emergency Drugs available, Suction available and Patient being monitored Patient Re-evaluated:Patient Re-evaluated prior to induction Oxygen Delivery Method: Circle System Utilized Preoxygenation: Pre-oxygenation with 100% oxygen Induction Type: IV induction Ventilation: Mask ventilation without difficulty Laryngoscope Size: Mac and 4 Grade View: Grade I Tube type: Oral Tube size: 7.5 mm Number of attempts: 1 Airway Equipment and Method: Stylet Placement Confirmation: ETT inserted through vocal cords under direct vision, positive ETCO2 and breath sounds checked- equal and bilateral Secured at: 22 cm Tube secured with: Tape Dental Injury: Teeth and Oropharynx as per pre-operative assessment

## 2021-06-19 NOTE — Evaluation (Signed)
Occupational Therapy Evaluation Patient Details Name: David [name] MRN: [id number] DOB: [birth year] Today's Date: 06/19/2021    History of Present Illness Pt is a 70 yo male s/p T11-T12 laminectomy with resection of adherent synovial cyst, microdissection right T11-T12 transpedicular microdiscectomy.   Clinical Impression   This 70 yo male admitted and underwent above presents to acute OT with PLOF of Mod I to independent with basic ADLs. Currently he is setup/S-min guard A for basic ADLs and min-guard A-min A for all mobility. He will continue to benefit from acute OT without need for follow up.    Follow Up Recommendations  No OT follow up;Supervision - Intermittent    Equipment Recommendations   (may need a 3n1, to assess next session')       Precautions / Restrictions Precautions Precautions: Back Precaution Booklet Issued: Yes (comment) Restrictions Weight Bearing Restrictions: No      Mobility Bed Mobility Overal bed mobility: Needs Assistance Bed Mobility: Rolling;Sidelying to Sit;Sit to Sidelying Rolling: Supervision Sidelying to sit: Supervision     Sit to sidelying: Min assist General bed mobility comments: S for VCs for sequencing, min A for legs to get back into bed    Transfers Overall transfer level: Needs assistance Equipment used: Rolling walker (2 wheeled) Transfers: Sit to/from Stand Sit to Stand: Min guard              Balance Overall balance assessment: Needs assistance Sitting-balance support: No upper extremity supported;Feet supported Sitting balance-Leahy Scale: Good     Standing balance support: Single extremity supported Standing balance-Leahy Scale: Poor                             ADL either performed or assessed with clinical judgement   ADL Overall ADL's : Needs assistance/impaired Eating/Feeding: Independent;Sitting   Grooming: Set up;Sitting Grooming Details (indicate cue type and reason): discussed using 2  cups for teeth brushing to avoid bending over the sink Upper Body Bathing: Set up;Sitting   Lower Body Bathing: Set up;Supervison/ safety Lower Body Bathing Details (indicate cue type and reason): min guard A sit<>stand. Pt can bring his legs up to get to his feet-- does need A of hands to bring legs up Upper Body Dressing : Set up;Sitting   Lower Body Dressing: Supervision/safety;Set up Lower Body Dressing Details (indicate cue type and reason): min guard A sit<>stand. Pt can bring his legs up to get to his feet-- does need A of hands to bring legs up Toilet Transfer: Min guard;Ambulation;RW;Regular Toilet;Grab bars   Toileting- Clothing Manipulation and Hygiene: Min guard;Sit to/from stand     Tub/Shower Transfer Details (indicate cue type and reason): I demonstrated to him how he needs to side step over tub instead of foreward stepping--he is not sure he can do this due to decreased strength in his legs. Will put this as a task for him to try tomorrow during OT session.         Vision Baseline Vision/History: Wears glasses Wears Glasses: Reading only Patient Visual Report: No change from baseline                  Pertinent Vitals/Pain Pain Assessment: 0-10 Pain Score: 4  Pain Location: incisional Pain Descriptors / Indicators: Aching;Sore Pain Intervention(s): Limited activity within patient's tolerance;Repositioned     Hand Dominance Right   Extremity/Trunk Assessment Upper Extremity Assessment Upper Extremity Assessment: Overall WFL for tasks assessed  Communication Communication Communication: No difficulties   Cognition Arousal/Alertness: Awake/alert Behavior During Therapy: WFL for tasks assessed/performed Overall Cognitive Status: Within Functional Limits for tasks assessed                                                      Home Living Family/patient expects to be discharged to:: Private residence Living Arrangements:  Non-relatives/Friends (roommate) Available Help at Discharge:  (roommate) Type of Home: Apartment Home Access: Level entry     Home Layout: One level     Bathroom Shower/Tub: Tub/shower unit;Curtain   Biochemist, clinical: Standard     Home Equipment: Cane - quad;Grab bars - tub/shower;Hand held shower head          Prior Functioning/Environment Level of Independence: Independent                 OT Problem List: Decreased strength;Decreased range of motion;Impaired balance (sitting and/or standing);Pain      OT Treatment/Interventions: Self-care/ADL training;DME and/or AE instruction;Patient/family education;Balance training    OT Goals(Current goals can be found in the care plan section) Acute Rehab OT Goals Patient Stated Goal: to go home tomorrow OT Goal Formulation: With patient Time For Goal Achievement: 07/03/21 Potential to Achieve Goals: Good  OT Frequency: Min 2X/week    AM-PAC OT "6 Clicks" Daily Activity     Outcome Measure Help from another person eating meals?: None Help from another person taking care of personal grooming?: A Little Help from another person toileting, which includes using toliet, bedpan, or urinal?: A Little Help from another person bathing (including washing, rinsing, drying)?: A Little Help from another person to put on and taking off regular upper body clothing?: A Little Help from another person to put on and taking off regular lower body clothing?: A Little 6 Click Score: 19   End of Session Equipment Utilized During Treatment: Gait belt;Rolling walker Nurse Communication: Mobility status (may need a 3n1)  Activity Tolerance: Patient tolerated treatment well Patient left: in bed;with call bell/phone within reach  OT Visit Diagnosis: Unsteadiness on feet (R26.81);Other abnormalities of gait and mobility (R26.89);Pain Pain - part of body:  (incisional)                Time: [id number] OT Time Calculation (min): 28 min Charges:   OT General Charges $OT Visit: 1 Visit OT Evaluation $OT Eval Moderate Complexity: 1 Mod OT Treatments $Self Care/Home Management : 8-22 mins [place] Circle, OTR/L Acute [employer] Pager [phone] Office [phone]    Almon Register 06/19/2021, 5:03 PM

## 2021-06-19 NOTE — Anesthesia Postprocedure Evaluation (Signed)
Anesthesia Post Note  Patient: David [name]  Procedure(s) Performed: Microdiscectomy - right - Thoracic Eleven-Thoracic Twelve (Right: Back)     Patient location during evaluation: PACU Anesthesia Type: General Level of consciousness: awake and alert and oriented Pain management: pain level controlled Vital Signs Assessment: post-procedure vital signs reviewed and stable Respiratory status: spontaneous breathing, nonlabored ventilation and respiratory function stable Cardiovascular status: blood pressure returned to baseline Postop Assessment: no apparent nausea or vomiting Anesthetic complications: no   No notable events documented.  Last Vitals:  Vitals:   06/19/21 1045 06/19/21 1114  BP: (!) 144/73 (!) 161/87  Pulse: 80 77  Resp: 10 20  Temp:  36.4 C  SpO2: 100% 100%    Last Pain:  Vitals:   06/19/21 1114  TempSrc: Oral  PainSc:                  Brennan Bailey

## 2021-06-19 NOTE — Op Note (Signed)
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  Asst. Surgeon: Daun Peacock, NP  Anesthesia: General  Indication: 70 year old male with progressive bilateral lower extremity numbness weakness and incoordination.  Work-up demonstrates evidence of critical spinal stenosis at T11-T12 secondary to facet arthropathy and contributed to by a rightward T11-12 disc herniation.  Patient presents now for decompressive surgery in hopes improving symptoms.  Operative note: After induction anesthesia, patient position prone onto bolsters and appropriately padded.  Patient's thoracic and lumbar region prepped and draped sterilely.  Incision made overlying T11-12.  Dissection performed bilaterally.  Retractor placed.  X-ray taken.  Level confirmed.  Decompressive laminectomy was then performed using Leksell rongeurs, Kerrison rongeurs and high-speed drill to remove the lamina of T11, and the medial aspect of the T11-T12 facet joints and the superior aspect of the T12 lamina.  Ligament flavum elevated and resected.  In the process of elevating the ligamentum flavum flavum it became apparent there was an adherent synovial cyst arising primarily from this facet joint on the right at T11-12.  This was dissected free using the microscope for microdissection of the spinal canal.  Complete resection of the cyst was achieved without evidence of injury to thecal sac or nerve roots.  The superior aspect of the pedicle of T12 was then removed using high-speed  drill.  Working lateral to the thecal sac and using the microscope for microdissection of the spinal canal epidural venous plexus was coagulated and cut.  A free fragment of disc herniation was dissected free and removed in several moderate sized pieces.  At this point a very thorough decompression of been achieved.  There was no evidence of injury to the thecal sac or nerve roots.  Wound is then irrigated.  Gelfoam was placed topically for hemostasis.  Wounds and closed in layers with Vicryl sutures.  Steri-Strips and sterile dressing were applied.  No apparent complications.  Patient tolerated the procedure well and he returns to recovery room postop.

## 2021-06-19 NOTE — Patient Instructions (Signed)
Dear David [name],  Below is a summary of the goals we discussed during our follow up appointment on June 08, 2021. Please contact me anytime with questions or concerns.   Visit Information  Patient Care Plan: CCM Pharmacy Care Plan     Problem Identified: CHL AMB "PATIENT-SPECIFIC PROBLEM"      Long-Range Goal: Disease Management   Start Date: 06/08/2021  Priority: High  Note:   Current Barriers:  None identified  Pharmacist Clinical Goal(s):  Patient will contact provider office for questions/concerns as evidenced notation of same in electronic health record through collaboration with PharmD and provider.   Interventions: 1:1 collaboration with David Carbon, MD regarding development and update of comprehensive plan of care as evidenced by provider attestation and co-signature Inter-disciplinary care team collaboration (see longitudinal plan of care) Comprehensive medication review performed; medication list updated in electronic medical record  Hypertension (BP goal <140/90) -Controlled - Losartan reduced 06/04/21 per cardiology due to hypotension. Pt reports feeling better but has not checked home BP. He has had several recent falls prior to dose reduction. He feels unsteady and contributes this to neuropathy/numbness. -Current treatment: Losartan 50 mg - 1 tablet daily -Pt is on the following BP lowering medications for other indications: cirrhosis - spironolactone, propranolol, furosemide -Medications previously tried: none  -Current home readings: none, he plans to purchase a home BP monitor -Denies hypotensive/hypertensive symptoms -Educated on Importance of home blood pressure monitoring; -Counseled to monitor BP at home daily for 1 week, document, and provide log at future appointments -Recommended to continue current medication; CMA follow up for BP log.  Diabetes (A1c goal <7%) -Controlled - per A1c 6.8% -Managed by endocrinology -Current medications: Metformin  1000 mg - 1 tablet twice daily Ozempic 0.5 mg - Inject once weekly Insulin NPH  (Novolin N) - Inject 30 units am and 40 units at bedtime Insulin lispro(Humalog) - Inject 14-16 units before meals -Medications previously tried: none reported  -Current home glucose readings fasting glucose: none reported today post prandial glucose: none -Denies hypoglycemic/hyperglycemic symptoms -Educated on A1c and blood sugar goals; Prevention and management of hypoglycemic episodes; -Counseled to check feet daily and get yearly eye exams -Recommended continue follow up with endocrinology. Please call with any cost concerns.   Neuropathy (Goal: Improve symptoms) -Uncontrolled - Reports bad neuropathy (pain and tingling in legs) gradually worse the past 3-4 months. Started going to chiropractor, helping some. Falling quite a bit. Very unsteady. Using a cane. Denies any treatment besides alpha lipoic acid for neuropathy. He has a microdiscectomy scheduled and is hopeful this will help with his pain. -Current treatment  OTC Alpha lipoic acid - 1 tablet daily -Medications previously tried: none -Recommended to continue current medication  Patient Goals/Self-Care Activities Patient will:  - contact team with health concerns  Follow Up Plan: Telephone follow up appointment with care management team member scheduled for:  6 months      Patient verbalizes understanding of instructions provided today and agrees to view in David [name].   David [name], PharmD Clinical Pharmacist [facility] [phone]

## 2021-06-19 NOTE — Brief Op Note (Signed)
06/19/2021  9:43 AM  PATIENT:  David [name]  70 y.o. male  PRE-OPERATIVE DIAGNOSIS:  Spondylosis with myelopathy  POST-OPERATIVE DIAGNOSIS:  Spondylosis with myelopathy  PROCEDURE:  Procedure(s): Microdiscectomy - right - Thoracic Eleven-Thoracic Twelve (Right)  SURGEON:  Surgeon(s) and Role:    * Earnie Larsson, MD - Primary    * Ashok Pall, MD - Assisting  PHYSICIAN ASSISTANT:   ASSISTANTSMearl Latin   ANESTHESIA:   general  EBL:  150 mL   BLOOD ADMINISTERED:none  DRAINS: none   LOCAL MEDICATIONS USED:  MARCAINE     SPECIMEN:  No Specimen  DISPOSITION OF SPECIMEN:  N/A  COUNTS:  YES  TOURNIQUET:  * No tourniquets in log *  DICTATION: .Dragon Dictation  PLAN OF CARE: Admit for overnight observation  PATIENT DISPOSITION:  PACU - hemodynamically stable.   Delay start of Pharmacological VTE agent (>24hrs) due to surgical blood loss or risk of bleeding: yes

## 2021-06-19 NOTE — Telephone Encounter (Signed)
Fax from [facility] will not cover paroxetine '20mg'$  2 daily. They want a rx for '40mg'$  1 daily. I have sent that in.

## 2021-06-19 NOTE — Transfer of Care (Signed)
Immediate Anesthesia Transfer of Care Note  Patient: [name]  Procedure(s) Performed: Microdiscectomy - right - Thoracic Eleven-Thoracic Twelve (Right: Back)  Patient Location: PACU  Anesthesia Type:General  Level of Consciousness: awake, alert  and oriented  Airway & Oxygen Therapy: Patient Spontanous Breathing  Post-op Assessment: Report given to RN and Post -op Vital signs reviewed and stable  Post vital signs: Reviewed and stable  Last Vitals:  Vitals Value Taken Time  BP 122/63 06/19/21 0957  Temp    Pulse 140 06/19/21 0957  Resp 14 06/19/21 0957  SpO2 93 % 06/19/21 0957  Vitals shown include unvalidated device data.  Last Pain:  Vitals:   06/19/21 0633  TempSrc:   PainSc: 7       Patients Stated Pain Goal: 3 ([id number])  Complications: No notable events documented.

## 2021-06-20 ENCOUNTER — Encounter (HOSPITAL_COMMUNITY): Payer: Self-pay | Admitting: Neurosurgery

## 2021-06-20 DIAGNOSIS — Z79899 Other long term (current) drug therapy: Secondary | ICD-10-CM | POA: Diagnosis not present

## 2021-06-20 DIAGNOSIS — Z87891 Personal history of nicotine dependence: Secondary | ICD-10-CM | POA: Diagnosis not present

## 2021-06-20 DIAGNOSIS — I10 Essential (primary) hypertension: Secondary | ICD-10-CM | POA: Diagnosis not present

## 2021-06-20 DIAGNOSIS — Z955 Presence of coronary angioplasty implant and graft: Secondary | ICD-10-CM | POA: Diagnosis not present

## 2021-06-20 DIAGNOSIS — M4714 Other spondylosis with myelopathy, thoracic region: Secondary | ICD-10-CM | POA: Diagnosis not present

## 2021-06-20 DIAGNOSIS — Z794 Long term (current) use of insulin: Secondary | ICD-10-CM | POA: Diagnosis not present

## 2021-06-20 DIAGNOSIS — I2510 Atherosclerotic heart disease of native coronary artery without angina pectoris: Secondary | ICD-10-CM | POA: Diagnosis not present

## 2021-06-20 DIAGNOSIS — E119 Type 2 diabetes mellitus without complications: Secondary | ICD-10-CM | POA: Diagnosis not present

## 2021-06-20 DIAGNOSIS — M7138 Other bursal cyst, other site: Secondary | ICD-10-CM | POA: Diagnosis not present

## 2021-06-20 DIAGNOSIS — Z7984 Long term (current) use of oral hypoglycemic drugs: Secondary | ICD-10-CM | POA: Diagnosis not present

## 2021-06-20 LAB — GLUCOSE, CAPILLARY
Glucose-Capillary: 51 mg/dL — ABNORMAL LOW (ref 70–99)
Glucose-Capillary: 55 mg/dL — ABNORMAL LOW (ref 70–99)
Glucose-Capillary: 82 mg/dL (ref 70–99)
Glucose-Capillary: 106 mg/dL — ABNORMAL HIGH (ref 70–99)

## 2021-06-20 MED ORDER — HYDROCODONE-ACETAMINOPHEN 10-325 MG PO TABS: 1.0000 | ORAL_TABLET | ORAL | 0 refills | Status: DC | PRN

## 2021-06-20 MED ORDER — CYCLOBENZAPRINE HCL 10 MG PO TABS: 10.0000 mg | ORAL_TABLET | Freq: Three times a day (TID) | ORAL | 0 refills | Status: DC | PRN

## 2021-06-20 NOTE — Progress Notes (Signed)
Hypoglycemic Event  CBG: 51  Treatment: 8 oz juice/soda  Symptoms: None  Follow-up CBG: [id number] CBG Result:55  Possible Reasons for Event: Unknown  Comments/MD notified:No    Amantha Sklar, Donia Guiles

## 2021-06-20 NOTE — Evaluation (Signed)
Physical Therapy Evaluation Patient Details Name: David [name] MRN: [id number] DOB: January 03, 1951 Today's Date: 06/20/2021   History of Present Illness  Pt is a 70 yo male admitted 7/22 s/p T11-T12 laminectomy with resection of adherent synovial cyst, microdissection right T11-T12 transpedicular microdiscectomy. PMHx: CAD, cirrhosis, depression, HTN, HLD  Clinical Impression  Pt pleasant and eager to return home. 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 Pt with bil LE weakness LLE>RLE with decreased dorsiflexion and toe drag with repeated falls at home. Pt with decreased strength, function and balance who will benefit from acute therapy to maximize mobility, safety and independence to decrease fall risk. Pt educated for all precautions.    Follow Up Recommendations Home health PT    Equipment Recommendations  Rolling walker with 5" wheels    Recommendations for Other Services       Precautions / Restrictions Precautions Precautions: Fall;Back Precaution Booklet Issued: Yes (comment)      Mobility  Bed Mobility Overal bed mobility: Modified Independent Bed Mobility: Rolling;Sidelying to Sit;Sit to Sidelying Rolling: Supervision Sidelying to sit: Supervision     Sit to sidelying: Supervision General bed mobility comments: pt able to appropriately perform supine<>sidelying<>sitting    Transfers Overall transfer level: Needs assistance   Transfers: Sit to/from Stand Sit to Stand: Supervision         General transfer comment: pt with good verbalization and use of hand placement, decreased control of descent  Ambulation/Gait Ambulation/Gait assistance: Min guard Gait Distance (Feet): 400 Feet Assistive device: Rolling walker (2 wheeled) Gait Pattern/deviations: Step-through pattern;Decreased stride length;Decreased dorsiflexion - right;Decreased dorsiflexion - left   Gait velocity interpretation: 1.31 - 2.62 ft/sec,  indicative of limited community ambulator General Gait Details: pt with toe drag bil with reliance on RW for stability and foot clearance. Cues for posture with slow speed  Stairs            Wheelchair Mobility    Modified Rankin (Stroke Patients Only)       Balance Overall balance assessment: Needs assistance Sitting-balance support: No upper extremity supported;Feet supported Sitting balance-Leahy Scale: Fair Sitting balance - Comments: loose his balance posteriorly as he fatigues while dressing.  He reports he uses a chair with a back when he dresses   Standing balance support: Bilateral upper extremity supported Standing balance-Leahy Scale: Poor Standing balance comment: Rw for standing and gait                             Pertinent Vitals/Pain Pain Assessment: 0-10 Pain Score: 5  Pain Location: incisional Pain Descriptors / Indicators: Aching;Guarding Pain Intervention(s): Limited activity within patient's tolerance;Premedicated before session;Repositioned;Monitored during session    Home Living Family/patient expects to be discharged to:: Private residence Living Arrangements: Non-relatives/Friends (roommate) Available Help at Discharge: Friend(s);Available PRN/intermittently (roommate) Type of Home: Apartment Home Access: Level entry     Home Layout: One level Home Equipment: Cane - quad;Grab bars - tub/shower;Hand held shower head      Prior Function Level of Independence: Independent               Hand Dominance   Dominant Hand: Right    Extremity/Trunk Assessment   Upper Extremity Assessment Upper Extremity Assessment: Overall WFL for tasks assessed    Lower Extremity Assessment Lower Extremity Assessment: RLE deficits/detail;LLE deficits/detail RLE Deficits / Details: hip flexion 4/5, dorsiflexion 3/5, knee extension 4/5 LLE Deficits /  Details: hip flexion 3/5, dorsiflexion 3/5, knee extension 3/5    Cervical / Trunk  Assessment Cervical / Trunk Assessment: Normal  Communication   Communication: No difficulties  Cognition Arousal/Alertness: Awake/alert Behavior During Therapy: WFL for tasks assessed/performed Overall Cognitive Status: No family/caregiver present to determine baseline cognitive functioning                                 General Comments: pt able to recall 2/3 precautions and demonstrate carry over from OT session      General Comments General comments (skin integrity, edema, etc.): reiterated back precautions.  Instructed him on use of reacher.  he inquired about return to work and instructed him to have that conversation with MD.  He also mentioned that he goes to a chiropractor - instructed him to discuss this with MD    Exercises General Exercises - Lower Extremity Long Arc Quad: AROM;Left;Seated;10 reps Hip Flexion/Marching: AROM;Left;Seated;10 reps Toe Raises: AROM;Both;Seated;10 reps   Assessment/Plan    PT Assessment Patient needs continued PT services  PT Problem List Decreased strength;Decreased mobility;Decreased activity tolerance;Decreased balance;Decreased knowledge of use of DME;Pain       PT Treatment Interventions Functional mobility training;Therapeutic activities;Patient/family education;Neuromuscular re-education;Balance training;Therapeutic exercise;DME instruction;Gait training    PT Goals (Current goals can be found in the Care Plan section)  Acute Rehab PT Goals Patient Stated Goal: return home and back to work PT Goal Formulation: With patient Time For Goal Achievement: 08/01/21 Potential to Achieve Goals: Good    Frequency Min 5X/week   Barriers to discharge Decreased caregiver support      Co-evaluation               AM-PAC PT "6 Clicks" Mobility  Outcome Measure Help needed turning from your back to your side while in a flat bed without using bedrails?: None Help needed moving from lying on your back to sitting on the  side of a flat bed without using bedrails?: None Help needed moving to and from a bed to a chair (including a wheelchair)?: A Little Help needed standing up from a chair using your arms (e.g., wheelchair or bedside chair)?: A Little Help needed to walk in hospital room?: A Little Help needed climbing 3-5 steps with a railing? : A Little 6 Click Score: 20    End of Session   Activity Tolerance: Patient tolerated treatment well Patient left: in chair;with call bell/phone within reach Nurse Communication: Mobility status;Precautions PT Visit Diagnosis: Other abnormalities of gait and mobility (R26.89);Difficulty in walking, not elsewhere classified (R26.2);Muscle weakness (generalized) (M62.81);History of falling (Z91.81)    Time: [id number] PT Time Calculation (min) (ACUTE ONLY): 20 min   Charges:   PT Evaluation $PT Eval Moderate Complexity: 1 Mod          Leeroy Lovings P, PT Acute Rehabilitation Services Pager: [phone] Office: [phone]   Sandy Salaam Alianis Trimmer 06/20/2021, 9:42 AM

## 2021-06-20 NOTE — Progress Notes (Signed)
Patient alert and oriented, voiding adequately, skin clean, dry and intact without evidence of skin break down, or symptoms of complications - no redness or edema noted, only slight tenderness at site.  Patient states pain is manageable at time of discharge. Patient has an appointment with MD in 2 weeks 

## 2021-06-20 NOTE — TOC Transition Note (Signed)
Transition of Care [facility]) - CM/SW Discharge Note   Patient Details  Name: Corneil Schull MRN: [id number] Date of Birth: January 28, 1951  [facility] CM/SW Contact:  Carles Collet, RN Phone Number: 06/20/2021, 10:54 AM   Clinical Narrative:   Spoke to patient at bedside. He has no preference for [facility] agency. Referral accepted by [facility]. Unit staff to provide DME prior to discharge.     Final next level of care: [place] Barriers to Discharge: No Barriers Identified   Patient Goals and CMS Choice Patient states their goals for this hospitalization and ongoing recovery are:: to go home CMS Medicare.gov Compare Post Acute Care list provided to:: Patient Choice offered to / list presented to : Patient  Discharge Placement                       Discharge Plan and Services                          HH Arranged: PT, OT [facility] Agency: [place] Date [place]: 06/20/21 Time [place]: 1053 Representative spoke with at [place]: [place] ([place]) Interventions     Readmission Risk Interventions No flowsheet data found.

## 2021-06-20 NOTE — Progress Notes (Signed)
Occupational Therapy Treatment Patient Details Name: Kore Sanluis MRN: [id number] DOB: 1951-03-27 Today's Date: 06/20/2021    History of present illness Pt is a 70 yo male admitted 7/22 s/p T11-T12 laminectomy with resection of adherent synovial cyst, microdissection right T11-T12 transpedicular microdiscectomy. PMHx: CAD, cirrhosis, depression, HTN, HLD   OT comments  Pt able to perform ADLs with min guard assist with effort.  He requires up to mod cues for hand placement and RW use and for safety/precautions.  He will benefit from [facility] as well as 3in1 commode.    Follow Up Recommendations  Home health OT    Equipment Recommendations  3 in 1 bedside commode    Recommendations for Other Services      Precautions / Restrictions Precautions Precautions: Back Precaution Booklet Issued: Yes (comment)       Mobility Bed Mobility Overal bed mobility: Needs Assistance Bed Mobility: Rolling;Sidelying to Sit;Sit to Sidelying Rolling: Supervision Sidelying to sit: Supervision     Sit to sidelying: Supervision General bed mobility comments: able to recall proper technique    Transfers                      Balance Overall balance assessment: Needs assistance Sitting-balance support: No upper extremity supported;Feet supported Sitting balance-Leahy Scale: Fair Sitting balance - Comments: loose his balance posteriorly as he fatigues while dressing.  He reports he uses a chair with a back when he dresses   Standing balance support: No upper extremity supported;During functional activity Standing balance-Leahy Scale: Fair Standing balance comment: able to maintain static standing with min guard assist                           ADL either performed or assessed with clinical judgement   ADL                   Upper Body Dressing : Set up;Sitting;Supervision/safety     Lower Body Dressing Details (indicate cue type and reason): min guard assist for  safety.  He looses balance posteriorly.  Requires up to mod cues to adhere to precautions. Toilet Transfer: Min guard;Ambulation;RW;Regular Toilet;Grab bars (Pt instructed in use of 3in1 commode.   He requires mod cues for RW use)   Toileting- Water quality scientist and Hygiene: Min guard;Sit to/from stand     Tub/Shower Transfer Details (indicate cue type and reason): Pt has a tub seat and grab bars.  He was intructed to sit back on seat and swing legs into tub.  He was shown a video.  He deferred actual practice stating he thinks he can manage without practice         Vision       Perception     Praxis      Cognition Arousal/Alertness: Awake/alert Behavior During Therapy: Impulsive Overall Cognitive Status: No family/caregiver present to determine baseline cognitive functioning                                 General Comments: pt very impulsive.  Requires cues repeated multiple times.  Decreased carry over of info        Exercises     Shoulder Instructions       General Comments reiterated back precautions.  Instructed him on use of reacher.  he inquired about return to work and instructed him to have that conversation with MD.  He  also mentioned that he goes to a chiropractor - instructed him to discuss this with MD    Pertinent Vitals/ Pain       Pain Assessment: 0-10 Pain Score: 5  Pain Location: incisional Pain Descriptors / Indicators: Operative site guarding Pain Intervention(s): Monitored during session;Repositioned  Home Living                                          Prior Functioning/Environment              Frequency  Min 2X/week        Progress Toward Goals  OT Goals(current goals can now be found in the care plan section)  Progress towards OT goals: Progressing toward goals     Plan Discharge plan needs to be updated;Equipment recommendations need to be updated    Co-evaluation                  AM-PAC OT "6 Clicks" Daily Activity     Outcome Measure   Help from another person eating meals?: None Help from another person taking care of personal grooming?: A Little Help from another person toileting, which includes using toliet, bedpan, or urinal?: A Little Help from another person bathing (including washing, rinsing, drying)?: A Little Help from another person to put on and taking off regular upper body clothing?: A Little Help from another person to put on and taking off regular lower body clothing?: A Little 6 Click Score: 19    End of Session Equipment Utilized During Treatment: Rolling walker  OT Visit Diagnosis: Unsteadiness on feet (R26.81);Other abnormalities of gait and mobility (R26.89);Pain   Activity Tolerance Patient tolerated treatment well   Patient Left in chair;with call bell/phone within reach   Nurse Communication Mobility status        Time: 0812-0840 OT Time Calculation (min): 28 min  Charges: OT General Charges $OT Visit: 1 Visit OT Treatments $Self Care/Home Management : 23-37 mins  Nilsa Nutting OTR/L Acute Rehabilitation Services Pager [phone] Office [phone]    Lucille Passy M 06/20/2021, 9:10 AM

## 2021-06-20 NOTE — Progress Notes (Signed)
Hypoglycemic Event  CBG: 55  Treatment: 8 oz juice/soda  Symptoms: None  Follow-up CBG: [id number] CBG Result:82  Possible Reasons for Event: Unknown  Comments/MD notified:no    Korde Jeppsen, Donia Guiles

## 2021-06-20 NOTE — Discharge Summary (Signed)
Physician Discharge Summary  Patient ID: David [name] MRN: [id number] DOB/AGE: 1951/03/12 70 y.o.  Admit date: 06/19/2021 Discharge date: 06/20/2021  Admission Diagnoses: Thoracic spondylosis with myelopathy  Discharge Diagnoses: Thoracic spondylosis with myelopathy.  Spinal stenosis. Active Problems:   Myelopathy concurrent with and due to spinal stenosis of thoracic region [facility])   Discharged Condition: good  Hospital Course: Patient was admitted to undergo surgical decompression in the lower thoracic region.  He tolerated surgery well  Consults: None  Significant Diagnostic Studies: None  Treatments: surgery: See op note  Discharge Exam: Blood pressure (!) 106/55, pulse 74, temperature 98.3 F (36.8 C), temperature source Oral, resp. rate 18, height '5\' 9"'$  (1.753 m), weight 77.1 kg, SpO2 100 %. Incision is clean and dry motor function is intact  Disposition: Discharge disposition: 01-Home or Self Care       Discharge Instructions     Call MD for:  redness, tenderness, or signs of infection (pain, swelling, redness, odor or green/yellow discharge around incision site)   Complete by: As directed    Call MD for:  severe uncontrolled pain   Complete by: As directed    Call MD for:  temperature >100.4   Complete by: As directed    Diet - low sodium heart healthy   Complete by: As directed    Discharge wound care:   Complete by: As directed    Per nursing staff   Increase activity slowly   Complete by: As directed       Allergies as of 06/20/2021   No Known Allergies      Medication List     TAKE these medications    ALPHA LIPOIC ACID PO Take 1 tablet by mouth daily.   Co Q 10 100 MG Caps Take 100 mg by mouth daily.   cyclobenzaprine 10 MG tablet Commonly known as: FLEXERIL Take 1 tablet (10 mg total) by mouth 3 (three) times daily as needed for muscle spasms.   ferrous sulfate 325 (65 FE) MG tablet Take 325 mg by mouth daily with breakfast.    furosemide 20 MG tablet Commonly known as: LASIX Take 1 tablet by mouth once daily   glucose blood test strip Commonly known as: ONE TOUCH ULTRA TEST Use to test blood sugar 2 times daily as instructed. 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   insulin NPH Human 100 UNIT/ML injection Commonly known as: NovoLIN N ReliOn Inject under skin 30 units in a.m. and 35-40 units at bedtime What changed:  how much to take how to take this when to take this additional instructions   Insulin Pen Needle 32G X 4 MM Misc Use 4x a day - with insulin   losartan 50 MG tablet Commonly known as: COZAAR Take 1 tablet (50 mg total) by mouth daily. Please make yearly appt with Dr. Burt Knack for October 2022 for future refills. Thank you 1st attempt   metFORMIN 1000 MG tablet Commonly known as: GLUCOPHAGE TAKE 1 TABLET BY MOUTH TWICE DAILY WITH MEALS   multivitamin with minerals Tabs tablet Take 1 tablet by mouth at bedtime.   OneTouch Delica Lancets Fine Misc Use  to test blood sugar 2 times daily as instructed. Dx: E11.59, E11.65   OVER THE COUNTER MEDICATION Take 2 capsules by mouth daily. Liver Support   Ozempic (0.25 or 0.5 MG/DOSE) 2 MG/1.5ML Sopn Generic drug: Semaglutide(0.25 or 0.'5MG'$ /DOS) Inject 0.5 mg into the skin once a week.   pantoprazole 40 MG tablet Commonly known as: PROTONIX Take 1 tablet by mouth twice daily   PARoxetine 40 MG tablet Commonly known as: PAXIL Take 1 tablet (40 mg total) by mouth every morning.   propranolol 80 MG tablet Commonly known as: INDERAL Take 1 tablet (80 mg total) by mouth 2 (two) times daily.   ReliOn  Insulin Syringe 31G X 15/64" 0.5 ML Misc Generic drug: Insulin Syringe-Needle U-100 USE  TWICE DAILY   rosuvastatin 10 MG tablet Commonly known as: CRESTOR Take 1 tablet by mouth once daily   sildenafil 100 MG tablet Commonly known as: VIAGRA TAKE 1 TABLET BY MOUTH ONCE DAILY AS NEEDED FOR ERECTILE DYSFUNCTION   spironolactone 50 MG tablet Commonly known as: ALDACTONE Take 1 tablet by mouth once daily   Systane 0.4-0.3 % Soln Generic drug: Polyethyl Glycol-Propyl Glycol Place 2 drops into both eyes daily.   traMADol 50 MG tablet Commonly known as: ULTRAM Take 50-100 mg by mouth 4 (four) times daily as needed for severe pain.   triamcinolone cream 0.1 % Commonly known as: KENALOG Apply 1 application topically 2 (two) times daily as needed. Apply to scalp   vitamin C 1000 MG tablet Take 1,000 mg by mouth daily.               Discharge Care Instructions  (From admission, onward)           Start     Ordered   06/20/21 0000  Discharge wound care:       Comments: Per nursing staff   06/20/21 0830            Follow-up Information     Earnie Larsson, MD. Call.   Specialty: Neurosurgery Why: If symptoms worsen Contact information: [address] [place] [address] [place] [address] [phone]                 Signed: Earleen Newport 06/20/2021, 8:31 AM

## 2021-06-20 NOTE — Discharge Instructions (Signed)
Wound Care Keep incision covered and dry for two days. Do not put any creams, lotions, or ointments on incision. Leave steri-strips on back.  They will fall off by themselves. Activity Walk each and every day, increasing distance each day. No lifting greater than 5 lbs.  Avoid excessive neck motion. No driving for 2 weeks; may ride as a passenger locally. If provided with back brace, wear when out of bed.  It is not necessary to wear brace in bed. Diet Resume your normal diet.   Call Your Doctor If Any of These Occur Redness, drainage, or swelling at the wound.  Temperature greater than 101 degrees. Severe pain not relieved by pain medication. Incision starts to come apart. Follow Up Appt Call  [phone])  for problems.  If you have any hardware placed in your spine, you will need an x-ray before your appointment.

## 2021-06-25 DIAGNOSIS — E785 Hyperlipidemia, unspecified: Secondary | ICD-10-CM | POA: Diagnosis not present

## 2021-06-25 DIAGNOSIS — Z4789 Encounter for other orthopedic aftercare: Secondary | ICD-10-CM | POA: Diagnosis not present

## 2021-06-25 DIAGNOSIS — Z794 Long term (current) use of insulin: Secondary | ICD-10-CM | POA: Diagnosis not present

## 2021-06-25 DIAGNOSIS — Z79891 Long term (current) use of opiate analgesic: Secondary | ICD-10-CM | POA: Diagnosis not present

## 2021-06-25 DIAGNOSIS — I119 Hypertensive heart disease without heart failure: Secondary | ICD-10-CM | POA: Diagnosis not present

## 2021-06-25 DIAGNOSIS — E119 Type 2 diabetes mellitus without complications: Secondary | ICD-10-CM | POA: Diagnosis not present

## 2021-06-25 DIAGNOSIS — K7460 Unspecified cirrhosis of liver: Secondary | ICD-10-CM | POA: Diagnosis not present

## 2021-06-25 DIAGNOSIS — Z79899 Other long term (current) drug therapy: Secondary | ICD-10-CM | POA: Diagnosis not present

## 2021-06-25 DIAGNOSIS — M4714 Other spondylosis with myelopathy, thoracic region: Secondary | ICD-10-CM | POA: Diagnosis not present

## 2021-06-25 DIAGNOSIS — F32A Depression, unspecified: Secondary | ICD-10-CM | POA: Diagnosis not present

## 2021-06-25 DIAGNOSIS — Z9181 History of falling: Secondary | ICD-10-CM | POA: Diagnosis not present

## 2021-06-25 DIAGNOSIS — I2510 Atherosclerotic heart disease of native coronary artery without angina pectoris: Secondary | ICD-10-CM | POA: Diagnosis not present

## 2021-07-04 ENCOUNTER — Other Ambulatory Visit: Payer: Self-pay | Admitting: Internal Medicine

## 2021-07-06 ENCOUNTER — Other Ambulatory Visit: Payer: Self-pay | Admitting: Gastroenterology

## 2021-07-07 ENCOUNTER — Telehealth: Payer: Self-pay

## 2021-07-07 NOTE — Chronic Care Management (AMB) (Addendum)
Chronic Care Management Pharmacy Assistant   Name: David [name]  MRN: [id number] DOB: Nov 27, 1951  Reason for Encounter: HTN Review  Recent office visits:  None since last CCM contact  Recent consult visits:  07/14/21 - Internal Medicine - Patient presented for follow up diabetes. Suggest a CGM. No medication changes. 06/04/21 - Cardiology - Patient presented for preoperative clearance- CHANGE Losartan to '50mg'$  (one tablet) once per day.  Hospital visits:  06/19/21 thru 06/20/21 [facility]-Patient presented for Orthopedic spine surgery discharged with short course of Hydrocodone 10/'325mg'$  take 1 tablet every 4 hours as needed for pain Cyclobenzaprine '10mg'$  take 1 tablet 3 times daily as needed for muscle spasms.  Medications: Outpatient Encounter Medications as of 07/07/2021  Medication Sig Note   ALPHA LIPOIC ACID PO Take 1 tablet by mouth daily.    Ascorbic Acid (VITAMIN C) 1000 MG tablet Take 1,000 mg by mouth daily.    Coenzyme Q10 (CO Q 10) 100 MG CAPS Take 100 mg by mouth daily.    cyclobenzaprine (FLEXERIL) 10 MG tablet Take 1 tablet (10 mg total) by mouth 3 (three) times daily as needed for muscle spasms.    ferrous sulfate 325 (65 FE) MG tablet Take 325 mg by mouth daily with breakfast.    furosemide (LASIX) 20 MG tablet Take 1 tablet by mouth once daily    glucose blood (ONE TOUCH ULTRA TEST) test strip Use to test blood sugar 2 times daily as instructed. 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    insulin lispro (HUMALOG KWIKPEN) 200 UNIT/ML KwikPen INJECT 14-16 UNITS INTO THE SKIN THREE TIMES DAILY BEFORE MEALS    insulin NPH Human (NOVOLIN N RELION) 100 UNIT/ML injection Inject under skin 30 units in a.m. and 35-40 units at bedtime    Insulin Pen Needle 32G X 4 MM MISC Use 4x a day - with insulin    Insulin Syringe-Needle U-100 (RELION INSULIN  SYRINGE) 31G X 15/64" 0.5 ML MISC USE  TWICE DAILY    losartan (COZAAR) 50 MG tablet Take 1 tablet (50 mg total) by mouth daily. Please make yearly appt with Dr. Burt Knack for October 2022 for future refills. Thank you 1st attempt    metFORMIN (GLUCOPHAGE) 1000 MG tablet TAKE 1 TABLET BY MOUTH TWICE DAILY WITH MEALS    Multiple Vitamin (MULTIVITAMIN WITH MINERALS) TABS tablet Take 1 tablet by mouth at bedtime.    ONETOUCH DELICA LANCETS FINE MISC Use to test blood sugar 2 times daily as instructed. Dx: E11.59, E11.65    OVER THE COUNTER MEDICATION Take 2 capsules by mouth daily. Liver Support    pantoprazole (PROTONIX) 40 MG tablet Take 1 tablet by mouth twice daily    PARoxetine (PAXIL) 40 MG tablet Take 1 tablet (40 mg total) by mouth every morning.    Polyethyl Glycol-Propyl Glycol (SYSTANE) 0.4-0.3 % SOLN Place 2 drops into both eyes daily.    propranolol (INDERAL) 80 MG tablet Take 1 tablet (80 mg total) by mouth 2 (two) times daily.    rosuvastatin (CRESTOR) 10 MG tablet Take 1 tablet by mouth once daily    Semaglutide,0.25 or 0.'5MG'$ /DOS, (OZEMPIC, 0.25 OR 0.5 MG/DOSE,) 2 MG/1.5ML SOPN Inject 0.5 mg into the skin once a week. 05/15/2021: Sunday   sildenafil (VIAGRA) 100 MG tablet TAKE 1 TABLET BY MOUTH ONCE DAILY AS NEEDED FOR ERECTILE DYSFUNCTION  spironolactone (ALDACTONE) 50 MG tablet Take 1 tablet by mouth once daily    traMADol (ULTRAM) 50 MG tablet Take 50-100 mg by mouth 4 (four) times daily as needed for severe pain.    triamcinolone cream (KENALOG) 0.1 % APPLY TOPICALLY TO SCALP TWO TIMES DAILY AS NEEDED    No facility-administered encounter medications on file as of 07/07/2021.    Recent Office Vitals: BP Readings from Last 3 Encounters:  06/20/21 (!) 106/55  06/04/21 108/72  05/22/21 100/62   Pulse Readings from Last 3 Encounters:  06/20/21 74  06/04/21 67  05/22/21 77    Wt Readings from Last 3 Encounters:  06/19/21 169 lb 15.6 oz (77.1 kg)  06/04/21 170 lb 9.6 oz (77.4  kg)  05/22/21 177 lb (80.3 kg)     Kidney Function Lab Results  Component Value Date/Time   CREATININE 1.21 05/21/2021 08:25 AM   CREATININE 1.21 12/30/2020 11:43 AM   CREATININE 0.99 09/13/2017 10:40 AM   CREATININE 0.97 06/09/2016 08:36 AM   GFR 60.12 12/30/2020 11:43 AM   GFRNONAA >60 05/21/2021 08:25 AM   GFRNONAA 78 09/13/2017 10:40 AM   GFRAA >60 08/31/2018 06:26 AM   GFRAA 90 09/13/2017 10:40 AM    BMP Latest Ref Rng & Units 05/21/2021 12/30/2020 06/23/2020  Glucose 70 - 99 mg/dL 154(H) 253(H) 162(H)  BUN 8 - 23 mg/dL 18 21 25(H)  Creatinine 0.61 - 1.24 mg/dL 1.21 1.21 1.20  BUN/Creat Ratio 6 - 22 (calc) - - -  Sodium 135 - 145 mmol/L 138 136 137  Potassium 3.5 - 5.1 mmol/L 4.5 4.5 4.8  Chloride 98 - 111 mmol/L 106 104 108  CO2 22 - 32 mmol/L '25 26 23  '$ Calcium 8.9 - 10.3 mg/dL 9.4 10.4 10.4    Attempted contact with [name] 3 times on 07/07/21, 07/16/21, 07/21/21. Unsuccessful outreach. Will attempt contact next month.   Current antihypertensive regimen:   Losartan '50mg'$  take 1 tablet daily    Adherence Review: Is the patient currently on ACE/ARB medication? Yes Does the patient have >5 day gap between last estimated fill dates? Unable to verify with patient if gap in Ozempic.   Star Rating Drugs:  Medication:  Last Fill: Day Supply Losartan '50mg'$             05/14/21            90ds Rosuvastatin '10mg'$      06/09/21            90ds Metformin '1000mg'$       06/09/21            90ds Ozempic                      06/01/21              28ds  Care Gaps: Last annual wellness visit: 12/11/2019  PCP appointment on 10/06/21 and Endocrinology appointment on 07/14/21  Debbora Dus, CPP notified  Avel Sensor, [place] Assistant [phone]  I have reviewed the care management and care coordination activities outlined in this encounter and I am certifying that I agree with the content of this note. No further action required.  Debbora Dus, PharmD Clinical  Pharmacist [facility] [phone]

## 2021-07-14 ENCOUNTER — Ambulatory Visit (INDEPENDENT_AMBULATORY_CARE_PROVIDER_SITE_OTHER): Payer: Medicare Other | Admitting: Internal Medicine

## 2021-07-14 ENCOUNTER — Other Ambulatory Visit: Payer: Self-pay

## 2021-07-14 ENCOUNTER — Encounter: Payer: Self-pay | Admitting: Internal Medicine

## 2021-07-14 VITALS — BP 120/78 | HR 67 | Ht 69.0 in | Wt 165.2 lb

## 2021-07-14 DIAGNOSIS — E785 Hyperlipidemia, unspecified: Secondary | ICD-10-CM

## 2021-07-14 DIAGNOSIS — E1159 Type 2 diabetes mellitus with other circulatory complications: Secondary | ICD-10-CM

## 2021-07-14 DIAGNOSIS — E663 Overweight: Secondary | ICD-10-CM

## 2021-07-14 DIAGNOSIS — E1165 Type 2 diabetes mellitus with hyperglycemia: Secondary | ICD-10-CM | POA: Diagnosis not present

## 2021-07-14 LAB — LIPID PANEL
Cholesterol: 135 mg/dL (ref 0–200)
HDL: 56.4 mg/dL (ref >39.00)
LDL Cholesterol: 64 mg/dL (ref 0–99)
NonHDL: 78.87
Total CHOL/HDL Ratio: 2
Triglycerides: 76 mg/dL (ref 0.0–149.0)
VLDL: 15.2 mg/dL (ref 0.0–40.0)

## 2021-07-14 LAB — MICROALBUMIN / CREATININE URINE RATIO
Creatinine,U: 81.6 mg/dL
Microalb Creat Ratio: 2.5 mg/g (ref 0.0–30.0)
Microalb, Ur: 2 mg/dL — ABNORMAL HIGH (ref 0.0–1.9)

## 2021-07-14 NOTE — Progress Notes (Signed)
Patient ID: David [name], male   DOB: 08/29/1951, 70 y.o.   MRN: [id number]   This visit occurred during the SARS-CoV-2 public health emergency.  Safety protocols were in place, including screening questions prior to the visit, additional usage of staff PPE, and extensive cleaning of exam room while observing appropriate contact time as indicated for disinfecting solutions.   HPI: David [name] is a 70 y.o.-year-old male, returning for f/u for DM2, dx in ~2008, insulin-dependent since 09/2015, uncontrolled, with complications (CAD - s/p stents in 2013, PN). Last visit 5 months ago.  Interim history: In 2021: He and his wife of 40 years separated and he had a difficult time living outside his home. Last year he was also diagnosed with cirrhosis.  He does have a history of heavy alcohol use and he continues to use alcohol.  He has anemia and decreased platelets. Before last visit, he changed his diet and reduced fast foods.  He lost 15 pounds.  Since then, he lost another 10 pounds. He was seeing a chiropractor for his neuropathy.  He was using infrared light and felt that this was helping.  He is planning to return to see her. He had microdiskectomy 06/19/2021 >> weakness, fell few times and scraped his knees and elbows. Will start PT/OT. For now, he walks with a cane.  Reviewed HbA1c levels: Lab Results  Component Value Date   HGBA1C 6.8 (H) 05/21/2021   HGBA1C 6.1 (A) 02/17/2021   HGBA1C 7.5 (A) 11/03/2020  03/05/2016: HbA1c 9.1%  He is on: - Metformin 1000 mg 2x a day, with meals - Lantus 40 >> 48 units at bedtime >> NPH 30 units in a.m. and  (25-30 units) units at night >> 30 units in a.m. and 40 units at night - Humalog 8-12 >> 16 >> 14-20 >> 16-20 >> 14-16 units before a meal - Ozempic 0.5 >> 1 >> 0.5 mg weekly (due to cost) He was on Ozempic from 01/2019 to 08/2019 but had to come off due to price.  We restarted this in 01/2020. He was on Bydureon 2 mg weekly - started 01/30/2017 >>  stopped 04/2017 b/c price - could not restart  Pt checks his sugars twice a day per review of his log: - am: 115, 174-282 >> 108, 125-234, 256 >> 80, 122-204 (higher this week) - 2h after b'fast: n/c >> 163 - before lunch: 85-187, 201 >> 158, 327 >> n/c >> n/c - 2h after lunch: n/c - before dinner:  136, 148-222, 267, 295 >> 113-258 >> 99-168, 185, 214 - 2h after dinner: n/c - bedtime:  126-209, 225-240 (candy), 261 >> 160-215 >> n/c  - nighttime: n/c Lowest sugar was 79 >> 90 >> 115 >> 115 >> 108 >> 80; it is unclear at which level he has hypoglycemia awareness. Highest sugar was 285 >> 295 >> 287 >> 215.  Glucometer: One Touch  Pt's meals are: - Breakfast: protein shake - Lunch: n/a - Dinner: meat + veggies /potatoes or salad - Snacks: no Diet sodas, ice tea with splenda.  No regular sodas.  -+ CKD, last BUN/creatinine:  Lab Results  Component Value Date   BUN 18 05/21/2021   CREATININE 1.21 05/21/2021  On losartan. -+ HL; last set of lipids: Lab Results  Component Value Date   CHOL 112 12/11/2019   HDL 46.60 12/11/2019   LDLCALC 52 12/11/2019   LDLDIRECT 103.9 11/05/2013   TRIG 65.0 12/11/2019   CHOLHDL 2 12/11/2019  On Crestor 10. -  last eye exam was in 03/2021: No DR reportedly.  Has previous history of cataract surgery. -he has numbness and tingling in his feet  He also has HTN, depression.  He has a history of anabolic steroid use for muscle building.  He had a cardiac cath 08/2018 for presyncope: Stable coronary anatomy without high-grade coronary stenosis.  He is on medical therapy for CAD.  He was found to be anemic (Hb 8.1) >> EGD report reviewed:  - Non-bleeding grade II esophageal varices. - Non-bleeding gastric ulcers with a clean ulcer base (Forrest Class III). Biopsied. - Erythematous duodenopathy. - Normal second portion of the duodenum.  ROS: Constitutional: no weight gain/+ weight loss,  + fatigue, no subjective hyperthermia, no subjective  hypothermia Eyes: no blurry vision, no xerophthalmia ENT: no sore throat, no nodules palpated in neck, no dysphagia, no odynophagia, no hoarseness Cardiovascular: no CP/no SOB/no palpitations/no leg swelling Respiratory: no cough/no SOB/no wheezing Gastrointestinal: no N/no V/no D/no C/no acid reflux Musculoskeletal: + muscle aches/+ joint aches Skin: no rashes, no hair loss Neurological: + tremors/+ numbness/+ tingling/no dizziness  I reviewed pt's medications, allergies, PMH, social hx, family hx, and changes were documented in the history of present illness. Otherwise, unchanged from my initial visit note.    Past Medical History:  Diagnosis Date   Allergy    Anemia    Arthritis    Bilateral hands and feet   CAD (coronary artery disease)    s/p bifurcation stenting (DES x2) and stenting bifurcation lesion distal RCA (1 DES)   Cataract    Cirrhosis (HCC)    Depression    Diabetes mellitus    Esophageal varices (HCC)    GERD (gastroesophageal reflux disease)    Hyperlipemia    Hypertension    Myocardial infarction ([place])    Sleep apnea    CPAP   Past Surgical History:  Procedure Laterality Date   BIOPSY  09/01/2018   Procedure: BIOPSY;  Surgeon: Mauri Pole, MD;  Location: [facility];  Service: Endoscopy;;   CATARACT EXTRACTION Bilateral    CORONARY ANGIOPLASTY WITH STENT PLACEMENT   March 2010   Dr. Olevia Perches   ESOPHAGOGASTRODUODENOSCOPY (EGD) WITH PROPOFOL N/A 09/01/2018   Procedure: ESOPHAGOGASTRODUODENOSCOPY (EGD) WITH PROPOFOL;  Surgeon: Mauri Pole, MD;  Location: [facility];  Service: Endoscopy;  Laterality: N/A;   LEFT HEART CATH AND CORONARY ANGIOGRAPHY N/A 08/31/2018   Procedure: LEFT HEART CATH AND CORONARY ANGIOGRAPHY;  Surgeon: Sherren Mocha, MD;  Location: [facility];  Service: Cardiovascular;  Laterality: N/A;   THORACIC DISCECTOMY Right 06/19/2021   Procedure: Microdiscectomy - right - Thoracic Eleven-Thoracic Twelve;  Surgeon: Earnie Larsson, MD;  Location: [facility];  Service: Neurosurgery;  Laterality: Right;   TONSILLECTOMY  1959   Social History   Social History   Marital Status: Married    Spouse Name: N/A   Number of Children: 2   Occupational History   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     Ascorbic Acid (VITAMIN C) 1000 MG tablet Take 1,000 mg by mouth daily.     Coenzyme Q10 (CO Q 10) 100 MG CAPS Take 100 mg by mouth daily.     cyclobenzaprine (FLEXERIL) 10 MG tablet Take 1 tablet (10 mg total) by mouth 3 (three) times daily as needed for muscle spasms. 30 tablet 0   ferrous sulfate 325 (65 FE) MG tablet Take 325 mg by mouth daily with breakfast.     furosemide (LASIX) 20 MG tablet Take 1 tablet by mouth once daily 90 tablet 1   glucose blood (ONE TOUCH ULTRA TEST) test strip Use to test blood sugar 2 times daily as instructed. Dx: E11.59, E11.65 100 each 5   HYDROcodone-acetaminophen (NORCO) 10-325 MG tablet Take 1-2 tablets by mouth every 4 (four) hours as needed for severe pain ((score 7 to 10)). 40 tablet 0   insulin lispro (HUMALOG KWIKPEN) 200 UNIT/ML KwikPen INJECT 14-16 UNITS INTO THE SKIN THREE TIMES DAILY BEFORE MEALS 30 mL 3   insulin NPH Human (NOVOLIN N RELION) 100 UNIT/ML injection Inject under skin 30 units in a.m. and 35-40 units at bedtime 20 mL 11   Insulin Pen Needle 32G X 4 MM MISC Use 4x a day - with insulin 200 each 3   Insulin  Syringe-Needle U-100 (RELION INSULIN SYRINGE) 31G X 15/64" 0.5 ML MISC USE  TWICE DAILY 100 each 5   losartan (COZAAR) 50 MG tablet Take 1 tablet (50 mg total) by mouth daily. Please make yearly appt with Dr. Burt Knack for October 2022 for future refills. Thank you 1st attempt 90 tablet 3   metFORMIN (GLUCOPHAGE) 1000 MG tablet TAKE 1 TABLET BY MOUTH TWICE DAILY WITH MEALS 180 tablet 1   Multiple Vitamin (MULTIVITAMIN WITH MINERALS) TABS tablet Take 1 tablet by mouth at bedtime.     ONETOUCH DELICA LANCETS FINE MISC Use to test blood sugar 2 times daily as instructed. Dx: E11.59, E11.65 100 each 5   OVER THE COUNTER MEDICATION Take 2 capsules by mouth daily. Liver Support     pantoprazole (PROTONIX) 40 MG tablet Take 1 tablet by mouth twice daily 180 tablet 3   PARoxetine (PAXIL) 40 MG tablet Take 1 tablet (40 mg total) by mouth every morning. 90 tablet 3   Polyethyl Glycol-Propyl Glycol (SYSTANE) 0.4-0.3 % SOLN Place 2 drops into both eyes daily.     propranolol (INDERAL) 80 MG tablet Take 1 tablet (80 mg total) by mouth 2 (two) times daily. 60 tablet 6   rosuvastatin (CRESTOR) 10 MG tablet Take 1 tablet by mouth once daily 90 tablet 1   Semaglutide,0.25 or 0.'5MG'$ /DOS, (OZEMPIC, 0.25 OR 0.5 MG/DOSE,) 2 MG/1.5ML SOPN Inject 0.5 mg into the skin once a week. 4.5 mL 3   sildenafil (VIAGRA) 100 MG tablet TAKE 1 TABLET BY MOUTH ONCE DAILY AS NEEDED FOR ERECTILE DYSFUNCTION 10 tablet 1   spironolactone (ALDACTONE) 50 MG tablet Take 1 tablet by mouth once daily 90 tablet 0   traMADol (ULTRAM) 50 MG tablet Take 50-100 mg by mouth 4 (four) times daily as needed for severe pain.     triamcinolone cream (KENALOG) 0.1 % APPLY TOPICALLY TO SCALP TWO TIMES DAILY AS NEEDED 30 g 0   No current facility-administered medications on file prior to visit.  No Known Allergies  Family History  Problem Relation Age of Onset   Heart failure Father        Deceased 59 y/o   Diabetes Father    Cancer Father         Prostate   Heart failure Mother        Deceased 35 y/o   Alcohol abuse Mother    Schizophrenia Son        Paranoid   Uveitis Son    Cancer Brother        prostate   Colon cancer Neg Hx    Esophageal cancer Neg Hx    Stomach cancer Neg Hx    Liver cancer Neg Hx    Pancreatic cancer Neg Hx    Rectal cancer Neg Hx    PE: BP 120/78 (BP Location: Right Arm, Patient Position: Sitting, Cuff Size: Normal)   Pulse 67   Ht '5\' 9"'$  (1.753 m)   Wt 165 lb 3.2 oz (74.9 kg)   SpO2 95%   BMI 24.40 kg/m  Body mass index is 24.4 kg/m. Wt Readings from Last 3 Encounters:  07/14/21 165 lb 3.2 oz (74.9 kg)  06/19/21 169 lb 15.6 oz (77.1 kg)  06/04/21 170 lb 9.6 oz (77.4 kg)   Constitutional: overweight, in NAD Eyes: PERRLA, EOMI, no exophthalmos ENT: moist mucous membranes, no thyromegaly, no cervical lymphadenopathy Cardiovascular: RRR, No MRG, + mild pitting edema in LE bilaterally Respiratory: CTA B Gastrointestinal: abdomen soft, NT, ND, BS+ Musculoskeletal: no deformities, strength intact in all 4 Skin: moist, warm, no rashes Neurological: + tremor with outstretched hands, DTR normal in all 4  ASSESSMENT: 1. DM2, insulin-dependent, uncontrolled, with complications - CAD - s/p 4 stents 2013 - seeing Dr Burt Knack - PN  Component     Latest Ref Rng 06/09/2016  C-Peptide     0.80-3.85 ng/mL 1.78  Glucose, Fasting     65 - 99 mg/dL 184 (H)  Glutamic Acid Decarb Ab     <5 IU/mL <5  Pancreatic Islet Cell Antibody     < 5 JDF Units <5   2. HL  3.  History of overweight  PLAN:  1. Patient with longstanding, uncontrolled, type 2 diabetes, with increased blood sugar variability.  He is on a complex medication regimen with metformin, GLP-1 receptor agonist and basal-bolus insulin regimen, with still poor control.  His HbA1c levels are usually better than expected from his log.  At last visit, I suspect that his meter may be defective and he is starting to use another meter.  Sugars did  improve but they were still fluctuating with still occasional blood sugars in the 200s, especially in the last week.  Upon questioning, these episodes are usually related to his drinking alcohol.  Discussed about the importance of continuing to try to stop.  For now, I would not suggest a change in regimen.  I did suggest to try to get the CGM and gave him a list of suppliers.  Discussed about advantages of using the device. - I suggested to:  Patient Instructions  Please continue: - Metformin 1000 mg 2x a day, with meals - Ozempic 0.5 mg weekly - NPH 30 units in am and 40 units at bedtime - Humalog 14-16 units 15 min before each meal  The most common suppliers for the continuous glucose monitor (FreeStyle [place] 2 or Dexcom) are:   [employer] [phone], extension [phone]  -[employer] [phone]  [employer] [phone]  [facility] [phone]    Please return in 3-4 months with your sugar log or CGM.   - advised to check sugars at different times of the day - 4x a day, rotating check times - advised for yearly eye exams >> he is UTD - return to clinic in 3-4 months     2. HL -Reviewed latest lipid panel from 11/2019: Fractions at goal: Lab Results  Component Value Date   CHOL 112 12/11/2019   HDL 46.60 12/11/2019   LDLCALC 52 12/11/2019   LDLDIRECT 103.9 11/05/2013   TRIG 65.0 12/11/2019   CHOLHDL 2 12/11/2019  -He continues on Crestor 10 mg daily without side effects -He is due for another lipid panel-we will check today  3.  History of overweight -Now with normal BMI -We were able to restart Ozempic 1.5 years ago after having to come off the medication due to price.  He tolerates it well.  -At last visit we discussed about cutting out sweets and alcohol >> he cut down fatty foods and alcohol -he lost 25 lbs in last 6 months.  If he continues to lose weight, we may need to consider decreasing the Ozempic dose or even  stopping it.  Component     Latest Ref Rng & Units 07/14/2021  Cholesterol     0 - 200 mg/dL 135  Triglycerides     0.0 - 149.0 mg/dL 76.0  HDL Cholesterol     >39.00 mg/dL 56.40  VLDL     0.0 - 40.0 mg/dL 15.2  LDL (calc)     0 - 99 mg/dL 64  Total CHOL/HDL Ratio      2  NonHDL      78.87  Microalb, Ur     0.0 - 1.9 mg/dL 2.0 (H)  Creatinine,U     mg/dL 81.6  MICROALB/CREAT RATIO     0.0 - 30.0 mg/g 2.5  Labs are at goal.  Philemon Kingdom, MD PhD [facility] End[facility]

## 2021-07-14 NOTE — Patient Instructions (Addendum)
Please continue: - Metformin 1000 mg 2x a day, with meals - Ozempic 0.5 mg weekly - NPH 30 units in am and 40 units at bedtime - Humalog 14-16 units 15 min before each meal  The most common suppliers for the continuous glucose monitor (FreeStyle [place] 2 or Dexcom) are:   [employer] [phone], extension [phone]  -[employer] [phone]  -[employer] [phone]    Please return in 3-4 months with your sugar log or CGM.

## 2021-07-30 DIAGNOSIS — E119 Type 2 diabetes mellitus without complications: Secondary | ICD-10-CM | POA: Diagnosis not present

## 2021-07-30 DIAGNOSIS — Z9181 History of falling: Secondary | ICD-10-CM | POA: Diagnosis not present

## 2021-07-30 DIAGNOSIS — F32A Depression, unspecified: Secondary | ICD-10-CM | POA: Diagnosis not present

## 2021-07-30 DIAGNOSIS — Z79891 Long term (current) use of opiate analgesic: Secondary | ICD-10-CM | POA: Diagnosis not present

## 2021-07-30 DIAGNOSIS — I2510 Atherosclerotic heart disease of native coronary artery without angina pectoris: Secondary | ICD-10-CM | POA: Diagnosis not present

## 2021-07-30 DIAGNOSIS — Z4789 Encounter for other orthopedic aftercare: Secondary | ICD-10-CM | POA: Diagnosis not present

## 2021-07-30 DIAGNOSIS — M4714 Other spondylosis with myelopathy, thoracic region: Secondary | ICD-10-CM | POA: Diagnosis not present

## 2021-07-30 DIAGNOSIS — I119 Hypertensive heart disease without heart failure: Secondary | ICD-10-CM | POA: Diagnosis not present

## 2021-07-30 DIAGNOSIS — K7460 Unspecified cirrhosis of liver: Secondary | ICD-10-CM | POA: Diagnosis not present

## 2021-07-30 DIAGNOSIS — Z794 Long term (current) use of insulin: Secondary | ICD-10-CM | POA: Diagnosis not present

## 2021-07-30 DIAGNOSIS — E785 Hyperlipidemia, unspecified: Secondary | ICD-10-CM | POA: Diagnosis not present

## 2021-07-30 DIAGNOSIS — Z79899 Other long term (current) drug therapy: Secondary | ICD-10-CM | POA: Diagnosis not present

## 2021-08-10 DIAGNOSIS — M4714 Other spondylosis with myelopathy, thoracic region: Secondary | ICD-10-CM | POA: Diagnosis not present

## 2021-08-17 ENCOUNTER — Telehealth: Payer: Self-pay

## 2021-08-17 NOTE — Progress Notes (Addendum)
Chronic Care Management Pharmacy Assistant   Name: David [name]  MRN: [id number] DOB: 12/25/1950  Reason for Encounter: Hypertension Disease State   Recent office visits:  None since last CCM contact  Recent consult visits:  07/14/2021 - Philemon Kingdom, MD - Patient presented for Diabetes.  Labs; Microalbumin and Lipid panel. Follow up 3-4 months.   Hospital visits:  None since last CCM contact  Medications: Outpatient Encounter Medications as of 08/17/2021  Medication Sig Note   ALPHA LIPOIC ACID PO Take 1 tablet by mouth daily.    Ascorbic Acid (VITAMIN C) 1000 MG tablet Take 1,000 mg by mouth daily.    Coenzyme Q10 (CO Q 10) 100 MG CAPS Take 100 mg by mouth daily.    cyclobenzaprine (FLEXERIL) 10 MG tablet Take 1 tablet (10 mg total) by mouth 3 (three) times daily as needed for muscle spasms.    ferrous sulfate 325 (65 FE) MG tablet Take 325 mg by mouth daily with breakfast.    furosemide (LASIX) 20 MG tablet Take 1 tablet by mouth once daily    glucose blood (ONE TOUCH ULTRA TEST) test strip Use to test blood sugar 2 times daily as instructed. 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    insulin lispro (HUMALOG KWIKPEN) 200 UNIT/ML KwikPen INJECT 14-16 UNITS INTO THE SKIN THREE TIMES DAILY BEFORE MEALS    insulin NPH Human (NOVOLIN N RELION) 100 UNIT/ML injection Inject under skin 30 units in a.m. and 35-40 units at bedtime    Insulin Pen Needle 32G X 4 MM MISC Use 4x a day - with insulin    Insulin Syringe-Needle U-100 (RELION INSULIN SYRINGE) 31G X 15/64" 0.5 ML MISC USE  TWICE DAILY    losartan (COZAAR) 50 MG tablet Take 1 tablet (50 mg total) by mouth daily. Please make yearly appt with Dr. Burt Knack for October 2022 for future refills. Thank you 1st attempt    metFORMIN (GLUCOPHAGE) 1000 MG tablet TAKE 1 TABLET BY MOUTH TWICE DAILY WITH MEALS     Multiple Vitamin (MULTIVITAMIN WITH MINERALS) TABS tablet Take 1 tablet by mouth at bedtime.    ONETOUCH DELICA LANCETS FINE MISC Use to test blood sugar 2 times daily as instructed. Dx: E11.59, E11.65    OVER THE COUNTER MEDICATION Take 2 capsules by mouth daily. Liver Support    pantoprazole (PROTONIX) 40 MG tablet Take 1 tablet by mouth twice daily    PARoxetine (PAXIL) 40 MG tablet Take 1 tablet (40 mg total) by mouth every morning.    Polyethyl Glycol-Propyl Glycol (SYSTANE) 0.4-0.3 % SOLN Place 2 drops into both eyes daily.    propranolol (INDERAL) 80 MG tablet Take 1 tablet (80 mg total) by mouth 2 (two) times daily.    rosuvastatin (CRESTOR) 10 MG tablet Take 1 tablet by mouth once daily    Semaglutide,0.25 or 0.'5MG'$ /DOS, (OZEMPIC, 0.25 OR 0.5 MG/DOSE,) 2 MG/1.5ML SOPN Inject 0.5 mg into the skin once a week. 05/15/2021: Sunday   sildenafil (VIAGRA) 100 MG tablet TAKE 1 TABLET BY MOUTH ONCE DAILY AS NEEDED FOR ERECTILE DYSFUNCTION    spironolactone (ALDACTONE) 50 MG tablet Take 1 tablet by mouth once daily    traMADol (ULTRAM) 50 MG tablet Take 50-100 mg by mouth 4 (four) times daily as needed for severe pain.    triamcinolone cream (KENALOG) 0.1 % APPLY TOPICALLY TO SCALP  TWO TIMES DAILY AS NEEDED    No facility-administered encounter medications on file as of 08/17/2021.    Recent Office Vitals: BP Readings from Last 3 Encounters:  07/14/21 120/78  06/20/21 (!) 106/55  06/04/21 108/72   Pulse Readings from Last 3 Encounters:  07/14/21 67  06/20/21 74  06/04/21 67    Wt Readings from Last 3 Encounters:  07/14/21 165 lb 3.2 oz (74.9 kg)  06/19/21 169 lb 15.6 oz (77.1 kg)  06/04/21 170 lb 9.6 oz (77.4 kg)     Lab Results  Component Value Date/Time   HGBA1C 6.0 (A) 08/21/2021 12:01 PM   HGBA1C 6.8 (H) 05/21/2021 08:25 AM   HGBA1C 6.1 (A) 02/17/2021 01:18 PM   HGBA1C 8.9 (H) 12/11/2019 11:05 AM    Kidney Function Lab Results  Component Value Date/Time   CREATININE 1.21  05/21/2021 08:25 AM   CREATININE 1.21 12/30/2020 11:43 AM   CREATININE 0.99 09/13/2017 10:40 AM   CREATININE 0.97 06/09/2016 08:36 AM   GFR 60.12 12/30/2020 11:43 AM   GFRNONAA >60 05/21/2021 08:25 AM   GFRNONAA 78 09/13/2017 10:40 AM   GFRAA >60 08/31/2018 06:26 AM   GFRAA 90 09/13/2017 10:40 AM    BMP Latest Ref Rng & Units 05/21/2021 12/30/2020 06/23/2020  Glucose 70 - 99 mg/dL 154(H) 253(H) 162(H)  BUN 8 - 23 mg/dL 18 21 25(H)  Creatinine 0.61 - 1.24 mg/dL 1.21 1.21 1.20  BUN/Creat Ratio 6 - 22 (calc) - - -  Sodium 135 - 145 mmol/L 138 136 137  Potassium 3.5 - 5.1 mmol/L 4.5 4.5 4.8  Chloride 98 - 111 mmol/L 106 104 108  CO2 22 - 32 mmol/L '25 26 23  '$ Calcium 8.9 - 10.3 mg/dL 9.4 10.4 10.4   Contacted patient on 08/07/2021 to discuss hypertension disease state  Current antihypertensive regimen:  Per patient he was removed off of Losartan '50mg'$  due to his blood pressure being low.   How often are you checking your Blood Pressure? twice daily  Current home BP readings: Patient stated today was 112/80, but did not have access to the other readings at this time.   DATE:             BP               PULSE 08/17/2021  112/80  Did not record  Wrist or arm cuff: Arm Caffeine intake: Patient states he has 1 diet coke Salt intake: Does not add OTC medications including pseudoephedrine or NSAIDs? No  Any readings above 180/120? No  What recent interventions/DTPs have been made by any provider to improve Blood Pressure control since last CPP Visit: Per patient he was removed off of Losartan '50mg'$  due to his blood pressure being low.   Any recent hospitalizations or ED visits since last visit with CPP? No  What diet changes have been made to improve Blood Pressure Control?  Patient has been watching was he eats.   What exercise is being done to improve your Blood Pressure Control?  Patient states he had back surgery so he is doing leg stretches, but is unable to exercise.   Adherence  Review: Is the patient currently on ACE/ARB medication? Yes Does the patient have >5 day gap between last estimated fill dates? No  Star Rating Drugs:  Medication:  Last Fill: Day Supply Rosuvastatin '10mg'$      06/09/2021 90 Metformin '1000mg'$       06/09/2021 90 Ozempic  07/28/2021  28 Verified with Pharmacy as patient did not know.  Per patient he was removed off of Losartan '50mg'$  due to his blood pressure being low.   Care Gaps: Annual wellness visit in last year? No 12/11/2019 Most Recent BP reading: 120/78 on 07/14/2021  If Diabetic: Most recent A1C reading: 6.8 on 05/21/2021 Last eye exam / retinopathy screening: 03/2021 Last diabetic foot exam: 09/08/2020  Gastro appointment on 08/18/2021 PCP appointment on 10/06/2021 for  AWV  Debbora Dus, CPP notified  Marijean Niemann, [place] Assistant [phone]   I have reviewed the care management and care coordination activities outlined in this encounter and I am certifying that I agree with the content of this note. No further action required.  Debbora Dus, PharmD Clinical Pharmacist [facility] [phone]

## 2021-08-18 ENCOUNTER — Ambulatory Visit: Payer: Medicare Other | Admitting: Gastroenterology

## 2021-08-21 ENCOUNTER — Encounter: Payer: Self-pay | Admitting: Internal Medicine

## 2021-08-21 ENCOUNTER — Other Ambulatory Visit: Payer: Self-pay

## 2021-08-21 ENCOUNTER — Telehealth: Payer: Self-pay | Admitting: Radiology

## 2021-08-21 ENCOUNTER — Ambulatory Visit (INDEPENDENT_AMBULATORY_CARE_PROVIDER_SITE_OTHER): Payer: Medicare Other | Admitting: Internal Medicine

## 2021-08-21 VITALS — BP 124/76 | HR 67 | Temp 97.6°F | Ht 69.0 in | Wt 168.0 lb

## 2021-08-21 DIAGNOSIS — F1099 Alcohol use, unspecified with unspecified alcohol-induced disorder: Secondary | ICD-10-CM

## 2021-08-21 DIAGNOSIS — Z23 Encounter for immunization: Secondary | ICD-10-CM | POA: Diagnosis not present

## 2021-08-21 DIAGNOSIS — E1159 Type 2 diabetes mellitus with other circulatory complications: Secondary | ICD-10-CM

## 2021-08-21 DIAGNOSIS — E441 Mild protein-calorie malnutrition: Secondary | ICD-10-CM | POA: Diagnosis not present

## 2021-08-21 DIAGNOSIS — S5001XA Contusion of right elbow, initial encounter: Secondary | ICD-10-CM | POA: Diagnosis not present

## 2021-08-21 DIAGNOSIS — E1165 Type 2 diabetes mellitus with hyperglycemia: Secondary | ICD-10-CM

## 2021-08-21 DIAGNOSIS — I776 Arteritis, unspecified: Secondary | ICD-10-CM | POA: Insufficient documentation

## 2021-08-21 DIAGNOSIS — M4714 Other spondylosis with myelopathy, thoracic region: Secondary | ICD-10-CM | POA: Diagnosis not present

## 2021-08-21 LAB — CBC
HCT: 32.2 % — ABNORMAL LOW (ref 39.0–52.0)
Hemoglobin: 10.6 g/dL — ABNORMAL LOW (ref 13.0–17.0)
MCHC: 33 g/dL (ref 30.0–36.0)
MCV: 103.4 fl — ABNORMAL HIGH (ref 78.0–100.0)
Platelets: 131 10*3/uL — ABNORMAL LOW (ref 150.0–400.0)
RBC: 3.12 Mil/uL — ABNORMAL LOW (ref 4.22–5.81)
RDW: 16.2 % — ABNORMAL HIGH (ref 11.5–15.5)
WBC: 8.1 10*3/uL (ref 4.0–10.5)

## 2021-08-21 LAB — COMPREHENSIVE METABOLIC PANEL
ALT: 24 U/L (ref 0–53)
AST: 39 U/L — ABNORMAL HIGH (ref 0–37)
Albumin: 3.8 g/dL (ref 3.5–5.2)
Alkaline Phosphatase: 117 U/L (ref 39–117)
BUN: 26 mg/dL — ABNORMAL HIGH (ref 6–23)
CO2: 27 mEq/L (ref 19–32)
Calcium: 9 mg/dL (ref 8.4–10.5)
Chloride: 107 mEq/L (ref 96–112)
Creatinine, Ser: 0.95 mg/dL (ref 0.40–1.50)
GFR: 80.01 mL/min (ref >60.00)
Glucose, Bld: 23 mg/dL — CL (ref 70–99)
Potassium: 4.3 mEq/L (ref 3.5–5.1)
Sodium: 141 mEq/L (ref 135–145)
Total Bilirubin: 0.7 mg/dL (ref 0.2–1.2)
Total Protein: 7.5 g/dL (ref 6.0–8.3)

## 2021-08-21 LAB — POCT GLYCOSYLATED HEMOGLOBIN (HGB A1C): Hemoglobin A1C: 6 % — AB (ref 4.0–5.6)

## 2021-08-21 LAB — SEDIMENTATION RATE: Sed Rate: 75 mm/hr — ABNORMAL HIGH (ref 0–20)

## 2021-08-21 NOTE — Telephone Encounter (Signed)
Per PCP, stopping ozempic.  Call pt.  Have him check sugar and take a snack.  If recurrent low sugars, then correct with high sugar snack and call clinic.  Thanks.

## 2021-08-21 NOTE — Assessment & Plan Note (Signed)
With unintended weight loss, etc Will check labs No murmur or stigmata of endocarditis Urgent rheumatology referral

## 2021-08-21 NOTE — Progress Notes (Signed)
Subjective:    Patient ID: David [name], male    DOB: [birth year], 70 y.o.   MRN: [id number]  HPI Here due to a rash on his feet This visit occurred during the SARS-CoV-2 public health emergency.  Safety protocols were in place, including screening questions prior to the visit, additional usage of staff PPE, and extensive cleaning of exam room while observing appropriate contact time as indicated for disinfecting solutions.   Started with rash over the entire tops of his feet for 2-3 weeks Now more on the toes and not as much on his feet No injury No pain No scaling Tried neosporin--no clear help  Having pain in right elbow since fall about a week ago Hit it directly on tile floor Swelling is better--but pain is still pretty bad. Hard to get comfortable sleeping Decreased strength trying to pick things up  Some low sugar readings--but no severe reactions  Current Outpatient Medications on File Prior to Visit  Medication Sig Dispense Refill   ALPHA LIPOIC ACID PO Take 1 tablet by mouth daily.     Ascorbic Acid (VITAMIN C) 1000 MG tablet Take 1,000 mg by mouth daily.     Coenzyme Q10 (CO Q 10) 100 MG CAPS Take 100 mg by mouth daily.     ferrous sulfate 325 (65 FE) MG tablet Take 325 mg by mouth daily with breakfast.     furosemide (LASIX) 20 MG tablet Take 1 tablet by mouth once daily 90 tablet 1   glucose blood (ONE TOUCH ULTRA TEST) test strip Use to test blood sugar 2 times daily as instructed. Dx: E11.59, E11.65 100 each 5   HYDROcodone-acetaminophen (NORCO) 10-325 MG tablet Take 1-2 tablets by mouth every 4 (four) hours as needed for severe pain ((score 7 to 10)). 40 tablet 0   insulin lispro (HUMALOG KWIKPEN) 200 UNIT/ML KwikPen INJECT 14-16 UNITS INTO THE SKIN THREE TIMES DAILY BEFORE MEALS 30 mL 3   insulin NPH Human (NOVOLIN N RELION) 100 UNIT/ML injection Inject under skin 30 units in a.m. and 35-40 units at bedtime 20 mL 11   Insulin Pen Needle 32G X 4 MM MISC Use 4x a day  - with insulin 200 each 3   Insulin Syringe-Needle U-100 (RELION INSULIN SYRINGE) 31G X 15/64" 0.5 ML MISC USE  TWICE DAILY 100 each 5   losartan (COZAAR) 50 MG tablet Take 1 tablet (50 mg total) by mouth daily. Please make yearly appt with Dr. Burt Knack for October 2022 for future refills. Thank you 1st attempt 90 tablet 3   metFORMIN (GLUCOPHAGE) 1000 MG tablet TAKE 1 TABLET BY MOUTH TWICE DAILY WITH MEALS 180 tablet 1   Multiple Vitamin (MULTIVITAMIN WITH MINERALS) TABS tablet Take 1 tablet by mouth at bedtime.     ONETOUCH DELICA LANCETS FINE MISC Use to test blood sugar 2 times daily as instructed. Dx: E11.59, E11.65 100 each 5   OVER THE COUNTER MEDICATION Take 2 capsules by mouth daily. Liver Support     pantoprazole (PROTONIX) 40 MG tablet Take 1 tablet by mouth twice daily 180 tablet 3   PARoxetine (PAXIL) 40 MG tablet Take 1 tablet (40 mg total) by mouth every morning. 90 tablet 3   Polyethyl Glycol-Propyl Glycol (SYSTANE) 0.4-0.3 % SOLN Place 2 drops into both eyes daily.     propranolol (INDERAL) 80 MG tablet Take 1 tablet (80 mg total) by mouth 2 (two) times daily. 60 tablet 6   rosuvastatin (CRESTOR) 10 MG tablet Take 1 tablet  by mouth once daily 90 tablet 1   Semaglutide,0.25 or 0.5MG /DOS, (OZEMPIC, 0.25 OR 0.5 MG/DOSE,) 2 MG/1.5ML SOPN Inject 0.5 mg into the skin once a week. 4.5 mL 3   sildenafil (VIAGRA) 100 MG tablet TAKE 1 TABLET BY MOUTH ONCE DAILY AS NEEDED FOR ERECTILE DYSFUNCTION 10 tablet 1   spironolactone (ALDACTONE) 50 MG tablet Take 1 tablet by mouth once daily 90 tablet 0   triamcinolone cream (KENALOG) 0.1 % APPLY TOPICALLY TO SCALP TWO TIMES DAILY AS NEEDED 30 g 0   No current facility-administered medications on file prior to visit.    No Known Allergies  Past Medical History:  Diagnosis Date   Allergy    Anemia    Arthritis    Bilateral hands and feet   CAD (coronary artery disease)    s/p bifurcation stenting (DES x2) and stenting bifurcation lesion distal  RCA (1 DES)   Cataract    Cirrhosis (HCC)    Depression    Diabetes mellitus    Esophageal varices (HCC)    GERD (gastroesophageal reflux disease)    Hyperlipemia    Hypertension    Myocardial infarction ([employer])    Sleep apnea    CPAP    Past Surgical History:  Procedure Laterality Date   BIOPSY  09/01/2018   Procedure: BIOPSY;  Surgeon: Mauri Pole, MD;  Location: [facility];  Service: Endoscopy;;   CATARACT EXTRACTION Bilateral    CORONARY ANGIOPLASTY WITH STENT PLACEMENT   March 2010   Dr. Olevia Perches   ESOPHAGOGASTRODUODENOSCOPY (EGD) WITH PROPOFOL N/A 09/01/2018   Procedure: ESOPHAGOGASTRODUODENOSCOPY (EGD) WITH PROPOFOL;  Surgeon: Mauri Pole, MD;  Location: [facility];  Service: Endoscopy;  Laterality: N/A;   LEFT HEART CATH AND CORONARY ANGIOGRAPHY N/A 08/31/2018   Procedure: LEFT HEART CATH AND CORONARY ANGIOGRAPHY;  Surgeon: Sherren Mocha, MD;  Location: [facility];  Service: Cardiovascular;  Laterality: N/A;   THORACIC DISCECTOMY Right 06/19/2021   Procedure: Microdiscectomy - right - Thoracic Eleven-Thoracic Twelve;  Surgeon: Earnie Larsson, MD;  Location: [facility];  Service: Neurosurgery;  Laterality: Right;   TONSILLECTOMY  1959    Family History  Problem Relation Age of Onset   Heart failure Father        Deceased 81 y/o   Diabetes Father    Cancer Father        Prostate   Heart failure Mother        Deceased 53 y/o   Alcohol abuse Mother    Schizophrenia Son        Paranoid   Uveitis Son    Cancer Brother        prostate   Colon cancer Neg Hx    Esophageal cancer Neg Hx    Stomach cancer Neg Hx    Liver cancer Neg Hx    Pancreatic cancer Neg Hx    Rectal cancer Neg Hx     Social History   Socioeconomic History   Marital status: Divorced    Spouse name: Not on file   Number of children: 2   Years of education: Not on file   Highest education level: Not on file  Occupational History   Occupation: Patent examiner buildings     Comment: site closed   Occupation: Engineering geologist    Comment: part time    Comment: Part time work  Tobacco Use   Smoking status: Former    Types: Cigarettes    Quit date: 11/30/1983  Years since quitting: 37.7   Smokeless tobacco: Never  Vaping Use   Vaping Use: Never used  Substance and Sexual Activity   Alcohol use: Yes    Alcohol/week: 20.0 standard drinks    Types: 20 Cans of beer per week    Comment: per pt 10-15 beers/week (as of 06/18/21)   Drug use: No   Sexual activity: Not Currently  Other Topics Concern   Not on file  Social History Narrative   No living will   Would want want son [name], as health care POA   Would accept resuscitation   Not sure about tube feeds--but doesn't want if cognitively unaware   Social Determinants of Health   Financial Resource Strain: Not on file  Food Insecurity: Not on file  Transportation Needs: Not on file  Physical Activity: Not on file  Stress: Not on file  Social Connections: Not on file  Intimate Partner Violence: Not on file   Review of Systems Appetite is down Has lost 20# in the past 6 months--not trying     Objective:   Physical Exam Constitutional:      Appearance: Normal appearance.  HENT:     Mouth/Throat:     Comments: No oral lesions Cardiovascular:     Rate and Rhythm: Normal rate and regular rhythm.     Heart sounds: No murmur heard.   No gallop.     Comments: 1+ pulse in left foot--absent on right Both feet cool Pulmonary:     Effort: Pulmonary effort is normal.     Breath sounds: Normal breath sounds. No wheezing or rales.  Abdominal:     Palpations: Abdomen is soft.     Tenderness: There is no abdominal tenderness.  Musculoskeletal:     Cervical back: Neck supple.     Comments: Mild swelling and tenderness over right olecranon Passive ROM is fairly full without sig pain  Lymphadenopathy:     Cervical: No cervical adenopathy.  Skin:    Comments: Vasculitic (non blanching macular  purplish lesions) on dorsum of toes and distal feet No hand lesions  Contusion with abrasions on both knees  Neurological:     Mental Status: He is alert.           Assessment & Plan:

## 2021-08-21 NOTE — Assessment & Plan Note (Addendum)
Has actually been running low Not sure if semaglutide related to severe weight loss Will check A1c---6.0%  Will stop ozempic

## 2021-08-21 NOTE — Assessment & Plan Note (Signed)
Related to semaglutide/the vasculitis/??

## 2021-08-21 NOTE — Telephone Encounter (Signed)
[facility] called a critical Glucose, 23, results given to Dr Damita Dunnings and sent to Dr Silvio Pate

## 2021-08-21 NOTE — Telephone Encounter (Signed)
LMTCB

## 2021-08-21 NOTE — Assessment & Plan Note (Signed)
Still drinking Realizes it could be part of his issues and decline

## 2021-08-21 NOTE — Assessment & Plan Note (Signed)
Nothing to suggest fracture Observe only Ice prn

## 2021-08-22 LAB — URINALYSIS, COMPLETE
Bacteria, UA: NONE SEEN /HPF
Bilirubin Urine: NEGATIVE
Glucose, UA: NEGATIVE
Hgb urine dipstick: NEGATIVE
Hyaline Cast: NONE SEEN /LPF
Ketones, ur: NEGATIVE
Leukocytes,Ua: NEGATIVE
Nitrite: NEGATIVE
RBC / HPF: NONE SEEN /HPF (ref 0–2)
Specific Gravity, Urine: 1.015 (ref 1.001–1.035)
Squamous Epithelial / HPF: NONE SEEN /HPF (ref <=5)
WBC, UA: NONE SEEN /HPF (ref 0–5)
pH: 6 (ref 5.0–8.0)

## 2021-08-24 DIAGNOSIS — M4714 Other spondylosis with myelopathy, thoracic region: Secondary | ICD-10-CM | POA: Diagnosis not present

## 2021-08-24 NOTE — Telephone Encounter (Signed)
Left message to call office

## 2021-08-24 NOTE — Telephone Encounter (Signed)
Pt returning call

## 2021-08-26 LAB — ANTINUCLEAR ANTIBODIES, IFA: ANA Titer 1: NEGATIVE

## 2021-08-26 NOTE — Telephone Encounter (Signed)
Left message on VM with details.

## 2021-08-27 LAB — ANCA SCREEN W REFLEX TITER: ANCA Screen: NEGATIVE

## 2021-08-27 LAB — HEPATITIS PANEL, ACUTE
Hep A IgM: NONREACTIVE
Hep B C IgM: NONREACTIVE
Hepatitis B Surface Ag: NONREACTIVE
Hepatitis C Ab: NONREACTIVE
SIGNAL TO CUT-OFF: 0.04 (ref <1.00)

## 2021-08-27 LAB — CRYOGLOBULIN SCREEN W/ RFLX: CRYOGLOBULIN SCR W/REFL CRYOGLOBULIN PROFILE, S: NEGATIVE

## 2021-08-27 NOTE — Telephone Encounter (Signed)
Noted  

## 2021-08-31 DIAGNOSIS — M4714 Other spondylosis with myelopathy, thoracic region: Secondary | ICD-10-CM | POA: Diagnosis not present

## 2021-09-05 ENCOUNTER — Inpatient Hospital Stay
Admission: EM | Admit: 2021-09-05 | Discharge: 2021-09-10 | DRG: 486 | Disposition: A | Discharge status: Skilled Nursing Facility | Payer: Medicare Other | Attending: Family Medicine | Admitting: Family Medicine

## 2021-09-05 ENCOUNTER — Other Ambulatory Visit: Payer: Self-pay

## 2021-09-05 ENCOUNTER — Emergency Department: Payer: Medicare Other

## 2021-09-05 ENCOUNTER — Emergency Department
Admission: EM | Admit: 2021-09-05 | Discharge: 2021-09-05 | Disposition: A | Discharge status: Home / Self Care | Payer: Medicare Other | Source: Home / Self Care | Attending: Emergency Medicine | Admitting: Emergency Medicine

## 2021-09-05 DIAGNOSIS — Z955 Presence of coronary angioplasty implant and graft: Secondary | ICD-10-CM

## 2021-09-05 DIAGNOSIS — E785 Hyperlipidemia, unspecified: Secondary | ICD-10-CM | POA: Diagnosis not present

## 2021-09-05 DIAGNOSIS — K7030 Alcoholic cirrhosis of liver without ascites: Secondary | ICD-10-CM | POA: Diagnosis not present

## 2021-09-05 DIAGNOSIS — Z20822 Contact with and (suspected) exposure to covid-19: Secondary | ICD-10-CM | POA: Diagnosis not present

## 2021-09-05 DIAGNOSIS — S8001XA Contusion of right knee, initial encounter: Secondary | ICD-10-CM | POA: Insufficient documentation

## 2021-09-05 DIAGNOSIS — S83242A Other tear of medial meniscus, current injury, left knee, initial encounter: Secondary | ICD-10-CM | POA: Diagnosis present

## 2021-09-05 DIAGNOSIS — Z4789 Encounter for other orthopedic aftercare: Secondary | ICD-10-CM | POA: Diagnosis not present

## 2021-09-05 DIAGNOSIS — Z87891 Personal history of nicotine dependence: Secondary | ICD-10-CM | POA: Insufficient documentation

## 2021-09-05 DIAGNOSIS — G4730 Sleep apnea, unspecified: Secondary | ICD-10-CM | POA: Diagnosis present

## 2021-09-05 DIAGNOSIS — E1169 Type 2 diabetes mellitus with other specified complication: Secondary | ICD-10-CM | POA: Insufficient documentation

## 2021-09-05 DIAGNOSIS — K219 Gastro-esophageal reflux disease without esophagitis: Secondary | ICD-10-CM | POA: Diagnosis present

## 2021-09-05 DIAGNOSIS — W19XXXA Unspecified fall, initial encounter: Secondary | ICD-10-CM

## 2021-09-05 DIAGNOSIS — T07XXXA Unspecified multiple injuries, initial encounter: Secondary | ICD-10-CM

## 2021-09-05 DIAGNOSIS — S80919A Unspecified superficial injury of unspecified knee, initial encounter: Secondary | ICD-10-CM | POA: Diagnosis not present

## 2021-09-05 DIAGNOSIS — E1140 Type 2 diabetes mellitus with diabetic neuropathy, unspecified: Secondary | ICD-10-CM | POA: Diagnosis not present

## 2021-09-05 DIAGNOSIS — Z794 Long term (current) use of insulin: Secondary | ICD-10-CM | POA: Insufficient documentation

## 2021-09-05 DIAGNOSIS — Z7984 Long term (current) use of oral hypoglycemic drugs: Secondary | ICD-10-CM | POA: Insufficient documentation

## 2021-09-05 DIAGNOSIS — R296 Repeated falls: Secondary | ICD-10-CM | POA: Diagnosis not present

## 2021-09-05 DIAGNOSIS — L03115 Cellulitis of right lower limb: Secondary | ICD-10-CM | POA: Insufficient documentation

## 2021-09-05 DIAGNOSIS — Z833 Family history of diabetes mellitus: Secondary | ICD-10-CM | POA: Diagnosis not present

## 2021-09-05 DIAGNOSIS — M25851 Other specified joint disorders, right hip: Secondary | ICD-10-CM | POA: Diagnosis not present

## 2021-09-05 DIAGNOSIS — R531 Weakness: Secondary | ICD-10-CM | POA: Diagnosis not present

## 2021-09-05 DIAGNOSIS — M009 Pyogenic arthritis, unspecified: Secondary | ICD-10-CM | POA: Diagnosis not present

## 2021-09-05 DIAGNOSIS — I252 Old myocardial infarction: Secondary | ICD-10-CM | POA: Diagnosis not present

## 2021-09-05 DIAGNOSIS — I2510 Atherosclerotic heart disease of native coronary artery without angina pectoris: Secondary | ICD-10-CM | POA: Insufficient documentation

## 2021-09-05 DIAGNOSIS — M25521 Pain in right elbow: Secondary | ICD-10-CM | POA: Diagnosis not present

## 2021-09-05 DIAGNOSIS — Z79899 Other long term (current) drug therapy: Secondary | ICD-10-CM

## 2021-09-05 DIAGNOSIS — W1839XA Other fall on same level, initial encounter: Secondary | ICD-10-CM | POA: Insufficient documentation

## 2021-09-05 DIAGNOSIS — S8002XA Contusion of left knee, initial encounter: Secondary | ICD-10-CM | POA: Insufficient documentation

## 2021-09-05 DIAGNOSIS — W010XXA Fall on same level from slipping, tripping and stumbling without subsequent striking against object, initial encounter: Secondary | ICD-10-CM | POA: Diagnosis present

## 2021-09-05 DIAGNOSIS — M25562 Pain in left knee: Secondary | ICD-10-CM | POA: Diagnosis not present

## 2021-09-05 DIAGNOSIS — E875 Hyperkalemia: Secondary | ICD-10-CM | POA: Diagnosis not present

## 2021-09-05 DIAGNOSIS — D539 Nutritional anemia, unspecified: Secondary | ICD-10-CM | POA: Diagnosis present

## 2021-09-05 DIAGNOSIS — R52 Pain, unspecified: Secondary | ICD-10-CM | POA: Diagnosis not present

## 2021-09-05 DIAGNOSIS — F1020 Alcohol dependence, uncomplicated: Secondary | ICD-10-CM | POA: Diagnosis present

## 2021-09-05 DIAGNOSIS — D696 Thrombocytopenia, unspecified: Secondary | ICD-10-CM | POA: Diagnosis not present

## 2021-09-05 DIAGNOSIS — D6959 Other secondary thrombocytopenia: Secondary | ICD-10-CM | POA: Diagnosis present

## 2021-09-05 DIAGNOSIS — I10 Essential (primary) hypertension: Secondary | ICD-10-CM | POA: Diagnosis present

## 2021-09-05 DIAGNOSIS — M25569 Pain in unspecified knee: Secondary | ICD-10-CM | POA: Diagnosis present

## 2021-09-05 DIAGNOSIS — B9561 Methicillin susceptible Staphylococcus aureus infection as the cause of diseases classified elsewhere: Secondary | ICD-10-CM | POA: Diagnosis present

## 2021-09-05 DIAGNOSIS — S7001XA Contusion of right hip, initial encounter: Secondary | ICD-10-CM | POA: Insufficient documentation

## 2021-09-05 DIAGNOSIS — E441 Mild protein-calorie malnutrition: Secondary | ICD-10-CM | POA: Diagnosis not present

## 2021-09-05 DIAGNOSIS — E119 Type 2 diabetes mellitus without complications: Secondary | ICD-10-CM | POA: Diagnosis not present

## 2021-09-05 DIAGNOSIS — Z8249 Family history of ischemic heart disease and other diseases of the circulatory system: Secondary | ICD-10-CM | POA: Diagnosis not present

## 2021-09-05 DIAGNOSIS — F32A Depression, unspecified: Secondary | ICD-10-CM | POA: Diagnosis present

## 2021-09-05 DIAGNOSIS — M11262 Other chondrocalcinosis, left knee: Secondary | ICD-10-CM | POA: Diagnosis not present

## 2021-09-05 DIAGNOSIS — Y92002 Bathroom of unspecified non-institutional (private) residence single-family (private) house as the place of occurrence of the external cause: Secondary | ICD-10-CM | POA: Diagnosis not present

## 2021-09-05 DIAGNOSIS — M65862 Other synovitis and tenosynovitis, left lower leg: Secondary | ICD-10-CM | POA: Diagnosis not present

## 2021-09-05 DIAGNOSIS — E1159 Type 2 diabetes mellitus with other circulatory complications: Secondary | ICD-10-CM | POA: Diagnosis not present

## 2021-09-05 DIAGNOSIS — M7989 Other specified soft tissue disorders: Secondary | ICD-10-CM | POA: Diagnosis not present

## 2021-09-05 DIAGNOSIS — M00861 Arthritis due to other bacteria, right knee: Secondary | ICD-10-CM | POA: Diagnosis not present

## 2021-09-05 DIAGNOSIS — M6281 Muscle weakness (generalized): Secondary | ICD-10-CM | POA: Diagnosis not present

## 2021-09-05 DIAGNOSIS — R2681 Unsteadiness on feet: Secondary | ICD-10-CM | POA: Diagnosis not present

## 2021-09-05 DIAGNOSIS — S7002XA Contusion of left hip, initial encounter: Secondary | ICD-10-CM | POA: Insufficient documentation

## 2021-09-05 DIAGNOSIS — L0390 Cellulitis, unspecified: Secondary | ICD-10-CM

## 2021-09-05 DIAGNOSIS — R262 Difficulty in walking, not elsewhere classified: Secondary | ICD-10-CM | POA: Diagnosis not present

## 2021-09-05 DIAGNOSIS — Z9181 History of falling: Secondary | ICD-10-CM

## 2021-09-05 DIAGNOSIS — M00062 Staphylococcal arthritis, left knee: Secondary | ICD-10-CM | POA: Diagnosis not present

## 2021-09-05 DIAGNOSIS — M25561 Pain in right knee: Secondary | ICD-10-CM | POA: Diagnosis not present

## 2021-09-05 DIAGNOSIS — Z7401 Bed confinement status: Secondary | ICD-10-CM | POA: Diagnosis not present

## 2021-09-05 DIAGNOSIS — R41841 Cognitive communication deficit: Secondary | ICD-10-CM | POA: Diagnosis not present

## 2021-09-05 DIAGNOSIS — M25551 Pain in right hip: Secondary | ICD-10-CM | POA: Diagnosis not present

## 2021-09-05 LAB — SYNOVIAL CELL COUNT + DIFF, W/ CRYSTALS
Eosinophils-Synovial: 0 %
Lymphocytes-Synovial Fld: 3 %
Monocyte-Macrophage-Synovial Fluid: 4 %
Neutrophil, Synovial: 93 %
Other Cells-SYN: 0
WBC, Synovial: 145568 /mm3 — ABNORMAL HIGH (ref 0–200)

## 2021-09-05 LAB — BASIC METABOLIC PANEL
Anion gap: 7 (ref 5–15)
BUN: 22 mg/dL (ref 8–23)
CO2: 24 mmol/L (ref 22–32)
Calcium: 8.6 mg/dL — ABNORMAL LOW (ref 8.9–10.3)
Chloride: 100 mmol/L (ref 98–111)
Creatinine, Ser: 1.08 mg/dL (ref 0.61–1.24)
GFR, Estimated: >60 mL/min (ref >60)
Glucose, Bld: 223 mg/dL — ABNORMAL HIGH (ref 70–99)
Potassium: 5.2 mmol/L — ABNORMAL HIGH (ref 3.5–5.1)
Sodium: 131 mmol/L — ABNORMAL LOW (ref 135–145)

## 2021-09-05 LAB — CBC WITH DIFFERENTIAL/PLATELET
Abs Immature Granulocytes: 0.11 10*3/uL — ABNORMAL HIGH (ref 0.00–0.07)
Basophils Absolute: 0 10*3/uL (ref 0.0–0.1)
Basophils Relative: 0 %
Eosinophils Absolute: 0 10*3/uL (ref 0.0–0.5)
Eosinophils Relative: 0 %
HCT: 26.7 % — ABNORMAL LOW (ref 39.0–52.0)
Hemoglobin: 9.1 g/dL — ABNORMAL LOW (ref 13.0–17.0)
Immature Granulocytes: 1 %
Lymphocytes Relative: 7 %
Lymphs Abs: 0.9 10*3/uL (ref 0.7–4.0)
MCH: 35 pg — ABNORMAL HIGH (ref 26.0–34.0)
MCHC: 34.1 g/dL (ref 30.0–36.0)
MCV: 102.7 fL — ABNORMAL HIGH (ref 80.0–100.0)
Monocytes Absolute: 2.1 10*3/uL — ABNORMAL HIGH (ref 0.1–1.0)
Monocytes Relative: 16 %
Neutro Abs: 10.5 10*3/uL — ABNORMAL HIGH (ref 1.7–7.7)
Neutrophils Relative %: 76 %
Platelets: 118 10*3/uL — ABNORMAL LOW (ref 150–400)
RBC: 2.6 MIL/uL — ABNORMAL LOW (ref 4.22–5.81)
RDW: 15.6 % — ABNORMAL HIGH (ref 11.5–15.5)
WBC: 13.7 10*3/uL — ABNORMAL HIGH (ref 4.0–10.5)
nRBC: 0 % (ref 0.0–0.2)

## 2021-09-05 MED ORDER — OXYCODONE-ACETAMINOPHEN 5-325 MG PO TABS
1.0000 | ORAL_TABLET | Freq: Once | ORAL | Status: AC
Start: 2021-09-05 — End: 2021-09-05
  Administered 2021-09-05: 1 via ORAL
  Filled 2021-09-05: qty 1

## 2021-09-05 MED ORDER — CEPHALEXIN 500 MG PO CAPS: 500.0000 mg | ORAL_CAPSULE | Freq: Three times a day (TID) | ORAL | 0 refills | Status: DC

## 2021-09-05 MED ORDER — FENTANYL CITRATE PF 50 MCG/ML IJ SOSY
50.0000 ug | PREFILLED_SYRINGE | Freq: Once | INTRAMUSCULAR | Status: AC
  Administered 2021-09-05: 50 ug via INTRAVENOUS
  Filled 2021-09-05: qty 1

## 2021-09-05 MED ORDER — FENTANYL CITRATE PF 50 MCG/ML IJ SOSY
50.0000 ug | PREFILLED_SYRINGE | Freq: Once | INTRAMUSCULAR | Status: DC
Start: 2021-09-05 — End: 2021-09-05

## 2021-09-05 MED ORDER — VANCOMYCIN HCL IN DEXTROSE 1-5 GM/200ML-% IV SOLN: 1000.0000 mg | Freq: Once | INTRAVENOUS | Status: DC

## 2021-09-05 MED ORDER — OXYCODONE-ACETAMINOPHEN 5-325 MG PO TABS
1.0000 | ORAL_TABLET | Freq: Once | ORAL | Status: DC
Start: 2021-09-05 — End: 2021-09-06

## 2021-09-05 MED ORDER — SODIUM CHLORIDE 0.9 % IV SOLN: 2.0000 g | Freq: Once | INTRAVENOUS | Status: DC

## 2021-09-05 MED ORDER — OXYCODONE-ACETAMINOPHEN 5-325 MG PO TABS: 1.0000 | ORAL_TABLET | ORAL | 0 refills | Status: DC | PRN

## 2021-09-05 MED ORDER — LIDOCAINE HCL (PF) 1 % IJ SOLN
10.0000 mL | Freq: Once | INTRAMUSCULAR | Status: AC
  Administered 2021-09-05: 10 mL
  Filled 2021-09-05: qty 10

## 2021-09-05 NOTE — ED Provider Notes (Signed)
[facility] Emergency Department Provider Note  ____________________________________________   Event Date/Time   First MD Initiated Contact with Patient 09/05/21 [phone]     (approximate)  I have reviewed the triage vital signs and the nursing notes.   HISTORY  Chief Complaint Knee Pain and Fall    HPI Javar Eshbach is a 70 y.o. male presents emergency department complaining of bilateral knee pain and bilateral hip pain.  Patient states he got up to go the bathroom and fell 2 times early this morning.  The first time he did not feel like he injured himself.  The second time he states he had severe pain.  Patient is stating he has not had anything for pain and it hurts very bad 10 out of 10.  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   Contusion of right elbow 08/21/2021   Malnutrition of mild degree ([place]) 08/21/2021   Myelopathy concurrent with and due to spinal stenosis of thoracic region [facility]) 06/19/2021   Actinic keratoses 05/22/2021   BPH associated with nocturia 06/10/2020   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[place]) 12/11/2019   Hip pain 08/07/2019   History of esophageal varices 12/07/3233   Alcoholic cirrhosis of liver with ascites ([place])    Secondary esophageal varices without bleeding ([place])    Anemia 08/31/2018   Overweight 09/13/2017   Poorly controlled type 2 diabetes mellitus with  circulatory disorder ([place]) 01/18/2017   Medial epicondylitis of elbow, left 08/03/2016   Elevated liver function tests 07/13/2016   Thrombocytopenia ([place]) 07/13/2016   Trigger middle finger of left hand 03/02/2016   Bilateral hand pain 12/02/2015   Primary osteoarthritis of both first carpometacarpal joints 12/02/2015   Advance directive discussed with patient 05/16/2015   Routine general medical examination at a health care facility 02/16/2011   Erectile dysfunction 02/16/2011   Essential hypertension 12/21/2010   Hyperlipidemia LDL goal <70 06/24/2009   Osteoarthritis, multiple sites 04/16/2009   CAD (coronary artery disease) 02/04/2009   Major depressive disorder, recurrent episode ([place]) 08/21/2008   ALLERGIC RHINITIS 08/21/2008   Sleep apnea 08/21/2008    Past Surgical History:  Procedure Laterality Date   BIOPSY  09/01/2018   Procedure: BIOPSY;  Surgeon: Mauri Pole, MD;  Location: [facility] END[facility]COPY;  Service: Endoscopy;;   CATARACT EXTRACTION Bilateral    CORONARY ANGIOPLASTY WITH STENT PLACEMENT   March 2010   Dr. Olevia Perches   ESOPHAGOGASTRODUODENOSCOPY (EGD) WITH PROPOFOL N/A 09/01/2018   Procedure: ESOPHAGOGASTRODUODENOSCOPY (EGD) WITH PROPOFOL;  Surgeon: Mauri Pole, MD;  [facility];  Service: Endoscopy;  Laterality: N/A;   LEFT HEART CATH AND CORONARY ANGIOGRAPHY N/A 08/31/2018   Procedure: LEFT HEART CATH AND CORONARY ANGIOGRAPHY;  Surgeon: Sherren Mocha, MD;  Location: [facility];  Service: Cardiovascular;  Laterality:  N/A;   THORACIC DISCECTOMY Right 06/19/2021   Procedure: Microdiscectomy - right - Thoracic Eleven-Thoracic Twelve;  Surgeon: Earnie Larsson, MD;  Location: [facility];  Service: Neurosurgery;  Laterality: Right;   TONSILLECTOMY  1959    Prior to Admission medications   Medication Sig Start Date End Date Taking? Authorizing Provider  cephALEXin (KEFLEX) 500 MG capsule Take 1 capsule (500 mg total) by mouth 3 (three) times daily for 10  days. 09/05/21 09/15/21 Yes Lashunda Greis, Linden Dolin, PA-C  oxyCODONE-acetaminophen (PERCOCET) 5-325 MG tablet Take 1 tablet by mouth every 4 (four) hours as needed for severe pain. 09/05/21 09/05/22 Yes Batsheva Stevick, Linden Dolin, PA-C  ALPHA LIPOIC ACID PO Take 1 tablet by mouth daily.    [provider]  Ascorbic Acid (VITAMIN C) 1000 MG tablet Take 1,000 mg by mouth daily.    [provider]  Coenzyme Q10 (CO Q 10) 100 MG CAPS Take 100 mg by mouth daily.    [provider]  ferrous sulfate 325 (65 FE) MG tablet Take 325 mg by mouth daily with breakfast.    [provider]  furosemide (LASIX) 20 MG tablet Take 1 tablet by mouth once daily 03/31/21   Milus Banister, MD  glucose blood (ONE TOUCH ULTRA TEST) test strip Use to test blood sugar 2 times daily as instructed. Dx: E11.59, E11.65 10/07/15   Philemon Kingdom, MD  HYDROcodone-acetaminophen (NORCO) 10-325 MG tablet Take 1-2 tablets by mouth every 4 (four) hours as needed for severe pain ((score 7 to 10)). 06/20/21   Kristeen Miss, MD  insulin lispro (HUMALOG KWIKPEN) 200 UNIT/ML KwikPen INJECT 14-16 UNITS INTO THE SKIN THREE TIMES DAILY BEFORE MEALS 11/03/20   Philemon Kingdom, MD  insulin NPH Human (NOVOLIN N RELION) 100 UNIT/ML injection Inject under skin 30 units in a.m. and 35-40 units at bedtime 07/28/20   Philemon Kingdom, MD  Insulin Pen Needle 32G X 4 MM MISC Use 4x a day - with insulin 06/11/19   Philemon Kingdom, MD  Insulin Syringe-Needle U-100 (RELION INSULIN SYRINGE) 31G X 15/64" 0.5 ML MISC USE  TWICE DAILY 11/24/20   Philemon Kingdom, MD  losartan (COZAAR) 50 MG tablet Take 1 tablet (50 mg total) by mouth daily. Please make yearly appt with Dr. Burt Knack for October 2022 for future refills. Thank you 1st attempt 06/04/21   Loel Dubonnet, NP  metFORMIN (GLUCOPHAGE) 1000 MG tablet TAKE 1 TABLET BY MOUTH TWICE DAILY WITH MEALS 12/08/20   Philemon Kingdom, MD  Multiple Vitamin (MULTIVITAMIN WITH MINERALS) TABS tablet Take  1 tablet by mouth at bedtime.    [provider]  [facility] DELICA LANCETS FINE MISC Use to test blood sugar 2 times daily as instructed. Dx: E11.59, E11.65 10/07/15   Philemon Kingdom, MD  OVER THE COUNTER MEDICATION Take 2 capsules by mouth daily. Liver Support    [provider]  pantoprazole (PROTONIX) 40 MG tablet Take 1 tablet by mouth twice daily 06/23/20   [name], MD  PARoxetine (PAXIL) 40 MG tablet Take 1 tablet (40 mg total) by mouth every morning. 06/19/21   [name], MD  Polyethyl Glycol-Propyl Glycol (SYSTANE) 0.4-0.3 % SOLN Place 2 drops into both eyes daily.    [provider]  propranolol (INDERAL) 80 MG tablet Take 1 tablet (80 mg total) by mouth 2 (two) times daily. 12/30/20   Milus Banister, MD  rosuvastatin (CRESTOR) 10 MG tablet Take 1 tablet by mouth once daily 06/09/21   Viviana Simpler  I, MD  Semaglutide,0.25 or 0.5MG /DOS, (OZEMPIC, 0.25 OR 0.5 MG/DOSE,) 2 MG/1.5ML SOPN Inject 0.5 mg into the skin once a week. 09/02/20   Philemon Kingdom, MD  sildenafil (VIAGRA) 100 MG tablet TAKE 1 TABLET BY MOUTH ONCE DAILY AS NEEDED FOR ERECTILE DYSFUNCTION 02/12/21   [name], MD  spironolactone (ALDACTONE) 50 MG tablet Take 1 tablet by mouth once daily 07/07/21   Milus Banister, MD  triamcinolone cream (KENALOG) 0.1 % APPLY TOPICALLY TO SCALP TWO TIMES DAILY AS NEEDED 07/06/21   [name], MD    Allergies Patient has no known allergies.  Family History  Problem Relation Age of Onset   Heart failure Father        Deceased 21 y/o   Diabetes Father    Cancer Father        Prostate   Heart failure Mother        Deceased 30 y/o   Alcohol abuse Mother    Schizophrenia Son        Paranoid   Uveitis Son    Cancer Brother        prostate   Colon cancer Neg Hx    Esophageal cancer Neg Hx    Stomach cancer Neg Hx    Liver cancer Neg Hx    Pancreatic cancer Neg Hx    Rectal cancer Neg Hx     Social History Social  History   Tobacco Use   Smoking status: Former    Types: Cigarettes    Quit date: 11/30/1983    Years since quitting: 37.7   Smokeless tobacco: Never  Vaping Use   Vaping Use: Never used  Substance Use Topics   Alcohol use: Yes    Alcohol/week: 20.0 standard drinks    Types: 20 Cans of beer per week    Comment: per pt 10-15 beers/week (as of 06/18/21)   Drug use: No    Review of Systems  Constitutional: No fever/chills Eyes: No visual changes. ENT: No sore throat. Respiratory: Denies cough Cardiovascular: Denies chest pain Gastrointestinal: Denies abdominal pain Genitourinary: Negative for dysuria. Musculoskeletal: Positive for chronic back pain, bilateral knee pain, bilateral hip pain Skin: Negative for rash. Psychiatric: no mood changes,     ____________________________________________   PHYSICAL EXAM:  VITAL SIGNS: ED Triage Vitals  Enc Vitals Group     BP 09/05/21 0458 127/78     Pulse Rate 09/05/21 0458 83     Resp 09/05/21 0458 18     Temp 09/05/21 0458 99.6 F (37.6 C)     Temp Source 09/05/21 0458 Oral     SpO2 09/05/21 0458 100 %     Weight 09/05/21 0457 168 lb (76.2 kg)     Height 09/05/21 0457 5\' 9"  (1.753 m)     Head Circumference --      Peak Flow --      Pain Score 09/05/21 0456 10     Pain Loc --      Pain Edu? --      Excl. in [place]? --     Constitutional: Alert and oriented. Well appearing and in no acute distress. Eyes: Conjunctivae are normal.  Head: Atraumatic. Nose: No congestion/rhinnorhea. Mouth/Throat: Mucous membranes are moist.   Neck:  supple no lymphadenopathy noted Cardiovascular: Normal rate, regular rhythm. Heart sounds are normal Respiratory: Normal respiratory effort.  No retractions, lungs c t a  GU: deferred Musculoskeletal: FROM all extremities, warm and well perfused, both knees are tender to  palpation, both hips are tender to palpation, right knee is warm to touch for several scabs noted Neurologic:  Normal speech and  language.  Skin:  Skin is warm, dry . No rash noted. Psychiatric: Mood and affect are normal. Speech and behavior are normal.  ____________________________________________   LABS (all labs ordered are listed, but only abnormal results are displayed)  Labs Reviewed - No data to display ____________________________________________   ____________________________________________  RADIOLOGY  .  The right and left knees, x-ray of the right elbow, x-ray of the right hip  ____________________________________________   PROCEDURES  Procedure(s) performed: No  Procedures    ____________________________________________   INITIAL IMPRESSION / ASSESSMENT AND PLAN / ED COURSE  Pertinent labs & imaging results that were available during my care of the patient were reviewed by me and considered in my medical decision making (see chart for details).   Patient is a 70 year old male presents after a fall last night.  Patient has history of chronic falls.  See HPI.  Physical exam shows patient per stable  X-rays of the right and left knee, right elbow reviewed by me confirmed by radiology to be negative  These were ordered from triage.  I do feel patient needs x-ray of the right hip as he is very tender to palpation   The right hip reviewed by me confirmed by radiology be negative  Did explain the findings to the patient.   prescription for Keflex for the cellulitis on the knee and Percocet for pain.  He is not to mix his hydrocodone with the Percocet.  Discharged in stable condition.  In agreement with treatment plan.  Follow-up with his regular doctor if negative  Andyn Sales was evaluated in Emergency Department on 09/05/2021 for the symptoms described in the history of present illness. He was evaluated in the context of the global COVID-19 pandemic, which necessitated consideration that the patient might be at risk for infection with the SARS-CoV-2 virus that causes COVID-19.  Institutional protocols and algorithms that pertain to the evaluation of patients at risk for COVID-19 are in a state of rapid change based on information released by regulatory bodies including the CDC and federal and state organizations. These policies and algorithms were followed during the patient's care in the ED.    As part of my medical decision making, I reviewed the following data within the [facility] notes reviewed and incorporated, Old chart reviewed, Radiograph reviewed , Notes from prior ED visits, and [place] Controlled Substance Database  ____________________________________________   FINAL CLINICAL IMPRESSION(S) / ED DIAGNOSES  Final diagnoses:  Fall, initial encounter  Multiple contusions  Cellulitis of right lower extremity      NEW MEDICATIONS STARTED DURING THIS VISIT:  New Prescriptions   CEPHALEXIN (KEFLEX) 500 MG CAPSULE    Take 1 capsule (500 mg total) by mouth 3 (three) times daily for 10 days.   OXYCODONE-ACETAMINOPHEN (PERCOCET) 5-325 MG TABLET    Take 1 tablet by mouth every 4 (four) hours as needed for severe pain.     Note:  This document was prepared using Dragon voice recognition software and may include unintentional dictation errors.    Versie Starks, PA-C 09/05/21 2080    Rada Hay, MD 09/05/21 1106

## 2021-09-05 NOTE — ED Triage Notes (Signed)
Pt to ED via EMS from home with c/o knee pain.  Yesterday he was walking to the bathroom and had 2 mechanical falls and braced his impact going down. He was seen here yesterday in the ED and d/c on percocet's for pain relief. Per pt's report knee feels more hot than the previous day. Notable redness seen to right knee with obvious lac noted.   Pt st that he is here tonight due to lack of mobility and pain relief. Pt reports falling onto soft surface today stating he  is "loosing traction with his footing and has fallen 2-3x a week since June/.   Pt A&Ox4

## 2021-09-05 NOTE — ED Triage Notes (Signed)
Patient BIB EMS from home after multiple falls this morning, complains of bilateral knee pain, states he just lost his balance. Patient does not live alone. Patient arrives in wheelchair, states pain is 10/10, AxOx4.

## 2021-09-05 NOTE — Discharge Instructions (Addendum)
Follow-up with your regular doctor if not improved in 5 to 7 days.  Return emergency department worsening.  Take medications as prescribed.  Do not take hydrocodone with oxycodone.

## 2021-09-05 NOTE — ED Notes (Signed)
Need redraw of purple and green per lab.   Bryson Ha. notified

## 2021-09-05 NOTE — ED Provider Notes (Signed)
[facility] Emergency Department Provider Note  ____________________________________________   I have reviewed the triage vital signs and the nursing notes.   HISTORY  Chief Complaint Knee Pain (Left knee)   History limited by: Not Limited   HPI David [name] is a 70 y.o. male who presents to the emergency department today because of concern for left knee pain. The patient was seen in the emergency department earlier today for MSK pain including bilateral knee pain. States that he was given prescription for pain medication and that it has not helped since he has been home. He says that he cannot walk on his left leg. Has noticed swelling to his left leg. Denies any fevers.   Records reviewed. Per medical record review patient has a history of ED visit earlier today. X-rays of knees negative for acute osseous abnormality.  Past Medical History:  Diagnosis Date   Allergy    Anemia    Arthritis    Bilateral hands and feet   CAD (coronary artery disease)    s/p bifurcation stenting (DES x2) and stenting bifurcation lesion distal RCA (1 DES)   Cataract    Cirrhosis (HCC)    Depression    Diabetes mellitus    Esophageal varices (HCC)    GERD (gastroesophageal reflux disease)    Hyperlipemia    Hypertension    Myocardial infarction ([place])    Sleep apnea    CPAP    Patient Active Problem List   Diagnosis Date Noted   Vasculitis ([place]) 08/21/2021   Contusion of right elbow 08/21/2021   Malnutrition of mild degree ([place]) 08/21/2021   Myelopathy concurrent with and due to spinal stenosis of thoracic region [facility]) 06/19/2021   Actinic keratoses 05/22/2021   BPH associated with nocturia 06/10/2020   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[place]) 12/11/2019   Hip pain 08/07/2019   History of esophageal varices 96/02/5408   Alcoholic cirrhosis of liver with ascites ([place])    Secondary esophageal varices without bleeding ([place])    Anemia 08/31/2018    Overweight 09/13/2017   Poorly controlled type 2 diabetes mellitus with circulatory disorder ([place]) 01/18/2017   Medial epicondylitis of elbow, left 08/03/2016   Elevated liver function tests 07/13/2016   Thrombocytopenia ([place]) 07/13/2016   Trigger middle finger of left hand 03/02/2016   Bilateral hand pain 12/02/2015   Primary osteoarthritis of both first carpometacarpal joints 12/02/2015   Advance directive discussed with patient 05/16/2015   Routine general medical examination at a health care facility 02/16/2011   Erectile dysfunction 02/16/2011   Essential hypertension 12/21/2010   Hyperlipidemia LDL goal <70 06/24/2009   Osteoarthritis, multiple sites 04/16/2009   CAD (coronary artery disease) 02/04/2009   Major depressive disorder, recurrent episode ([place]) 08/21/2008   ALLERGIC RHINITIS 08/21/2008   Sleep apnea 08/21/2008    Past Surgical History:  Procedure Laterality Date   BIOPSY  09/01/2018   Procedure: BIOPSY;  Surgeon: Mauri Pole, MD;  [facility] ENDOSCOPY;  Service: Endoscopy;;   CATARACT EXTRACTION Bilateral    CORONARY ANGIOPLASTY WITH STENT PLACEMENT   March 2010   Dr. Olevia Perches   ESOPHAGOGASTRODUODENOSCOPY (EGD) WITH PROPOFOL N/A 09/01/2018   Procedure: ESOPHAGOGASTRODUODENOSCOPY (EGD) WITH PROPOFOL;  Surgeon: Mauri Pole, MD;  Location: [facility] ENDOSCOPY;  Service: Endoscopy;  Laterality: N/A;   LEFT HEART CATH AND CORONARY ANGIOGRAPHY N/A 08/31/2018   Procedure: LEFT HEART CATH AND CORONARY ANGIOGRAPHY;  Surgeon: Sherren Mocha, MD;  Location: [place] CV LAB;  Service: Cardiovascular;  Laterality:  N/A;   THORACIC DISCECTOMY Right 06/19/2021   Procedure: Microdiscectomy - right - Thoracic Eleven-Thoracic Twelve;  Surgeon: Earnie Larsson, MD;  Location: [place];  Service: Neurosurgery;  Laterality: Right;   TONSILLECTOMY  1959    Prior to Admission medications   Medication Sig Start Date End Date Taking? Authorizing Provider  ALPHA LIPOIC ACID PO Take 1  tablet by mouth daily.    [provider]  Ascorbic Acid (VITAMIN C) 1000 MG tablet Take 1,000 mg by mouth daily.    [provider]  cephALEXin (KEFLEX) 500 MG capsule Take 1 capsule (500 mg total) by mouth 3 (three) times daily for 10 days. 09/05/21 09/15/21  Caryn Section Linden Dolin, PA-C  Coenzyme Q10 (CO Q 10) 100 MG CAPS Take 100 mg by mouth daily.    [provider]  ferrous sulfate 325 (65 FE) MG tablet Take 325 mg by mouth daily with breakfast.    [provider]  furosemide (LASIX) 20 MG tablet Take 1 tablet by mouth once daily 03/31/21   Milus Banister, MD  glucose blood (ONE TOUCH ULTRA TEST) test strip Use to test blood sugar 2 times daily as instructed. Dx: E11.59, E11.65 10/07/15   Philemon Kingdom, MD  HYDROcodone-acetaminophen (NORCO) 10-325 MG tablet Take 1-2 tablets by mouth every 4 (four) hours as needed for severe pain ((score 7 to 10)). 06/20/21   Kristeen Miss, MD  insulin lispro (HUMALOG KWIKPEN) 200 UNIT/ML KwikPen INJECT 14-16 UNITS INTO THE SKIN THREE TIMES DAILY BEFORE MEALS 11/03/20   Philemon Kingdom, MD  insulin NPH Human (NOVOLIN N RELION) 100 UNIT/ML injection Inject under skin 30 units in a.m. and 35-40 units at bedtime 07/28/20   Philemon Kingdom, MD  Insulin Pen Needle 32G X 4 MM MISC Use 4x a day - with insulin 06/11/19   Philemon Kingdom, MD  Insulin Syringe-Needle U-100 (RELION INSULIN SYRINGE) 31G X 15/64" 0.5 ML MISC USE  TWICE DAILY 11/24/20   Philemon Kingdom, MD  losartan (COZAAR) 50 MG tablet Take 1 tablet (50 mg total) by mouth daily. Please make yearly appt with Dr. Burt Knack for October 2022 for future refills. Thank you 1st attempt 06/04/21   Loel Dubonnet, NP  metFORMIN (GLUCOPHAGE) 1000 MG tablet TAKE 1 TABLET BY MOUTH TWICE DAILY WITH MEALS 12/08/20   Philemon Kingdom, MD  Multiple Vitamin (MULTIVITAMIN WITH MINERALS) TABS tablet Take 1 tablet by mouth at bedtime.    [provider]  [facility] DELICA LANCETS FINE MISC  Use to test blood sugar 2 times daily as instructed. Dx: E11.59, E11.65 10/07/15   Philemon Kingdom, MD  OVER THE COUNTER MEDICATION Take 2 capsules by mouth daily. Liver Support    [provider]  oxyCODONE-acetaminophen (PERCOCET) 5-325 MG tablet Take 1 tablet by mouth every 4 (four) hours as needed for severe pain. 09/05/21 09/05/22  Caryn Section, Linden Dolin, PA-C  pantoprazole (PROTONIX) 40 MG tablet Take 1 tablet by mouth twice daily 06/23/20   [name], MD  PARoxetine (PAXIL) 40 MG tablet Take 1 tablet (40 mg total) by mouth every morning. 06/19/21   [name], MD  Polyethyl Glycol-Propyl Glycol (SYSTANE) 0.4-0.3 % SOLN Place 2 drops into both eyes daily.    [provider]  propranolol (INDERAL) 80 MG tablet Take 1 tablet (80 mg total) by mouth 2 (two) times daily. 12/30/20   Milus Banister, MD  rosuvastatin (CRESTOR) 10 MG tablet Take 1 tablet by mouth once daily 06/09/21   Viviana Simpler  I, MD  Semaglutide,0.25 or 0.5MG /DOS, (OZEMPIC, 0.25 OR 0.5 MG/DOSE,) 2 MG/1.5ML SOPN Inject 0.5 mg into the skin once a week. 09/02/20   Philemon Kingdom, MD  sildenafil (VIAGRA) 100 MG tablet TAKE 1 TABLET BY MOUTH ONCE DAILY AS NEEDED FOR ERECTILE DYSFUNCTION 02/12/21   [name], MD  spironolactone (ALDACTONE) 50 MG tablet Take 1 tablet by mouth once daily 07/07/21   Milus Banister, MD  triamcinolone cream (KENALOG) 0.1 % APPLY TOPICALLY TO SCALP TWO TIMES DAILY AS NEEDED 07/06/21   [name], MD    Allergies Patient has no known allergies.  Family History  Problem Relation Age of Onset   Heart failure Father        Deceased 79 y/o   Diabetes Father    Cancer Father        Prostate   Heart failure Mother        Deceased 68 y/o   Alcohol abuse Mother    Schizophrenia Son        Paranoid   Uveitis Son    Cancer Brother        prostate   Colon cancer Neg Hx    Esophageal cancer Neg Hx    Stomach cancer Neg Hx    Liver cancer Neg Hx    Pancreatic cancer  Neg Hx    Rectal cancer Neg Hx     Social History Social History   Tobacco Use   Smoking status: Former    Types: Cigarettes    Quit date: 11/30/1983    Years since quitting: 37.7   Smokeless tobacco: Never  Vaping Use   Vaping Use: Never used  Substance Use Topics   Alcohol use: Yes    Alcohol/week: 20.0 standard drinks    Types: 20 Cans of beer per week    Comment: per pt 10-15 beers/week (as of 06/18/21)   Drug use: No    Review of Systems Constitutional: No fever/chills Eyes: No visual changes. ENT: No sore throat. Cardiovascular: Denies chest pain. Respiratory: Denies shortness of breath. Gastrointestinal: No abdominal pain.  No nausea, no vomiting.  No diarrhea.   Genitourinary: Negative for dysuria. Musculoskeletal: Bilateral knee pain, left greater than right. Skin: Positive for various wounds to lower extremities.  Neurological: Negative for headaches, focal weakness or numbness.  ____________________________________________   PHYSICAL EXAM:  VITAL SIGNS: ED Triage Vitals  Enc Vitals Group     BP 09/05/21 2137 111/89     Pulse Rate 09/05/21 2137 79     Resp 09/05/21 2137 16     Temp --      Temp src --      SpO2 09/05/21 2137 100 %     Weight 09/05/21 2139 168 lb (76.2 kg)     Height 09/05/21 2139 5\' 9"  (1.753 m)     Head Circumference --      Peak Flow --      Pain Score 09/05/21 2138 9   Constitutional: Alert and oriented.  Eyes: Conjunctivae are normal.  ENT      Head: Normocephalic and atraumatic.      Nose: No congestion/rhinnorhea.      Mouth/Throat: Mucous membranes are moist.      Neck: No stridor. Hematological/Lymphatic/Immunilogical: No cervical lymphadenopathy. Cardiovascular: Normal rate, regular rhythm.  No murmurs, rubs, or gallops.  Respiratory: Normal respiratory effort without tachypnea nor retractions. Breath sounds are clear and equal bilaterally. No wheezes/rales/rhonchi. Gastrointestinal: Soft and non tender. No rebound. No  guarding.  Genitourinary: Deferred Musculoskeletal: Right knee with scab and some surrounding erythema. Left knee with large effusion. No erythema or warmth. Unable to flex knee secondary to pain.  Neurologic:  Normal speech and language. No gross focal neurologic deficits are appreciated.  Skin:  Skin is warm, dry and intact. No rash noted. Psychiatric: Mood and affect are normal. Speech and behavior are normal. Patient exhibits appropriate insight and judgment.  ____________________________________________    LABS (pertinent positives/negatives)  BMP na 131, k 5.2, glu 223, cr 1.08 CBC wbc 13.7, hgb 9.1, plt 118 Synovial fluid, turbid, intra/extracellular crystals, WBC 145,568 ____________________________________________   EKG  None  ____________________________________________    RADIOLOGY  None  ____________________________________________   PROCEDURES  Procedures  ____________________________________________   INITIAL IMPRESSION / ASSESSMENT AND PLAN / ED COURSE  Pertinent labs & imaging results that were available during my care of the patient were reviewed by me and considered in my medical decision making (see chart for details).   Patient presented to the emergency department today because of severe left knee pain. X-ray performed earlier today without acute osseous abnormality. On exam patient did have a large effusion. Given history of DM and decreased ROM secondary to pain did have concern for possible septic joint, thus arthrocentesis was performed. Was consistent with pseudogout however also had 145K WBCs. Discussed concern for possible infection with Dr. Sabra Heck. At this time did not think patient necessitated OR. Did recommend admission to await cultures. Will start patient on abx. Will admit to the hospitalist service.   ____________________________________________   FINAL CLINICAL IMPRESSION(S) / ED DIAGNOSES  Final diagnoses:  Acute pain of left  knee     Note: This dictation was prepared with Dragon dictation. Any transcriptional errors that result from this process are unintentional     Nance Pear, MD 09/06/21 1641

## 2021-09-06 ENCOUNTER — Encounter: Payer: Self-pay | Admitting: Family Medicine

## 2021-09-06 DIAGNOSIS — I10 Essential (primary) hypertension: Secondary | ICD-10-CM | POA: Diagnosis present

## 2021-09-06 DIAGNOSIS — K7030 Alcoholic cirrhosis of liver without ascites: Secondary | ICD-10-CM | POA: Diagnosis present

## 2021-09-06 DIAGNOSIS — L03115 Cellulitis of right lower limb: Secondary | ICD-10-CM

## 2021-09-06 DIAGNOSIS — I252 Old myocardial infarction: Secondary | ICD-10-CM | POA: Diagnosis not present

## 2021-09-06 DIAGNOSIS — Y92002 Bathroom of unspecified non-institutional (private) residence single-family (private) house as the place of occurrence of the external cause: Secondary | ICD-10-CM | POA: Diagnosis not present

## 2021-09-06 DIAGNOSIS — D539 Nutritional anemia, unspecified: Secondary | ICD-10-CM | POA: Diagnosis present

## 2021-09-06 DIAGNOSIS — F1020 Alcohol dependence, uncomplicated: Secondary | ICD-10-CM | POA: Diagnosis present

## 2021-09-06 DIAGNOSIS — D696 Thrombocytopenia, unspecified: Secondary | ICD-10-CM | POA: Diagnosis not present

## 2021-09-06 DIAGNOSIS — M25562 Pain in left knee: Secondary | ICD-10-CM

## 2021-09-06 DIAGNOSIS — Z955 Presence of coronary angioplasty implant and graft: Secondary | ICD-10-CM | POA: Diagnosis not present

## 2021-09-06 DIAGNOSIS — F32A Depression, unspecified: Secondary | ICD-10-CM | POA: Diagnosis present

## 2021-09-06 DIAGNOSIS — S83242A Other tear of medial meniscus, current injury, left knee, initial encounter: Secondary | ICD-10-CM | POA: Diagnosis present

## 2021-09-06 DIAGNOSIS — L0390 Cellulitis, unspecified: Secondary | ICD-10-CM | POA: Insufficient documentation

## 2021-09-06 DIAGNOSIS — Z794 Long term (current) use of insulin: Secondary | ICD-10-CM

## 2021-09-06 DIAGNOSIS — Z833 Family history of diabetes mellitus: Secondary | ICD-10-CM | POA: Diagnosis not present

## 2021-09-06 DIAGNOSIS — Z87891 Personal history of nicotine dependence: Secondary | ICD-10-CM | POA: Diagnosis not present

## 2021-09-06 DIAGNOSIS — E119 Type 2 diabetes mellitus without complications: Secondary | ICD-10-CM | POA: Diagnosis not present

## 2021-09-06 DIAGNOSIS — E1140 Type 2 diabetes mellitus with diabetic neuropathy, unspecified: Secondary | ICD-10-CM | POA: Diagnosis present

## 2021-09-06 DIAGNOSIS — E785 Hyperlipidemia, unspecified: Secondary | ICD-10-CM | POA: Diagnosis present

## 2021-09-06 DIAGNOSIS — M11262 Other chondrocalcinosis, left knee: Secondary | ICD-10-CM

## 2021-09-06 DIAGNOSIS — E875 Hyperkalemia: Secondary | ICD-10-CM | POA: Diagnosis present

## 2021-09-06 DIAGNOSIS — K219 Gastro-esophageal reflux disease without esophagitis: Secondary | ICD-10-CM | POA: Diagnosis present

## 2021-09-06 DIAGNOSIS — M25569 Pain in unspecified knee: Secondary | ICD-10-CM | POA: Insufficient documentation

## 2021-09-06 DIAGNOSIS — I2510 Atherosclerotic heart disease of native coronary artery without angina pectoris: Secondary | ICD-10-CM | POA: Diagnosis present

## 2021-09-06 DIAGNOSIS — M25561 Pain in right knee: Secondary | ICD-10-CM

## 2021-09-06 DIAGNOSIS — G4730 Sleep apnea, unspecified: Secondary | ICD-10-CM | POA: Diagnosis present

## 2021-09-06 DIAGNOSIS — M00062 Staphylococcal arthritis, left knee: Secondary | ICD-10-CM | POA: Diagnosis present

## 2021-09-06 DIAGNOSIS — B9561 Methicillin susceptible Staphylococcus aureus infection as the cause of diseases classified elsewhere: Secondary | ICD-10-CM | POA: Diagnosis present

## 2021-09-06 DIAGNOSIS — Z9181 History of falling: Secondary | ICD-10-CM | POA: Diagnosis not present

## 2021-09-06 DIAGNOSIS — Z8249 Family history of ischemic heart disease and other diseases of the circulatory system: Secondary | ICD-10-CM | POA: Diagnosis not present

## 2021-09-06 DIAGNOSIS — Z20822 Contact with and (suspected) exposure to covid-19: Secondary | ICD-10-CM | POA: Diagnosis present

## 2021-09-06 DIAGNOSIS — W010XXA Fall on same level from slipping, tripping and stumbling without subsequent striking against object, initial encounter: Secondary | ICD-10-CM | POA: Diagnosis present

## 2021-09-06 DIAGNOSIS — D6959 Other secondary thrombocytopenia: Secondary | ICD-10-CM | POA: Diagnosis present

## 2021-09-06 LAB — CBC
HCT: 24.8 % — ABNORMAL LOW (ref 39.0–52.0)
Hemoglobin: 8.4 g/dL — ABNORMAL LOW (ref 13.0–17.0)
MCH: 35.1 pg — ABNORMAL HIGH (ref 26.0–34.0)
MCHC: 33.9 g/dL (ref 30.0–36.0)
MCV: 103.8 fL — ABNORMAL HIGH (ref 80.0–100.0)
Platelets: 110 10*3/uL — ABNORMAL LOW (ref 150–400)
RBC: 2.39 MIL/uL — ABNORMAL LOW (ref 4.22–5.81)
RDW: 15.5 % (ref 11.5–15.5)
WBC: 13.3 10*3/uL — ABNORMAL HIGH (ref 4.0–10.5)
nRBC: 0 % (ref 0.0–0.2)

## 2021-09-06 LAB — GLUCOSE, CAPILLARY
Glucose-Capillary: 145 mg/dL — ABNORMAL HIGH (ref 70–99)
Glucose-Capillary: 137 mg/dL — ABNORMAL HIGH (ref 70–99)
Glucose-Capillary: 41 mg/dL — CL (ref 70–99)
Glucose-Capillary: 54 mg/dL — ABNORMAL LOW (ref 70–99)
Glucose-Capillary: 64 mg/dL — ABNORMAL LOW (ref 70–99)
Glucose-Capillary: 89 mg/dL (ref 70–99)
Glucose-Capillary: 214 mg/dL — ABNORMAL HIGH (ref 70–99)

## 2021-09-06 LAB — BASIC METABOLIC PANEL
Anion gap: 6 (ref 5–15)
BUN: 22 mg/dL (ref 8–23)
CO2: 25 mmol/L (ref 22–32)
Calcium: 8.5 mg/dL — ABNORMAL LOW (ref 8.9–10.3)
Chloride: 102 mmol/L (ref 98–111)
Creatinine, Ser: 1.03 mg/dL (ref 0.61–1.24)
GFR, Estimated: >60 mL/min (ref >60)
Glucose, Bld: 179 mg/dL — ABNORMAL HIGH (ref 70–99)
Potassium: 4.8 mmol/L (ref 3.5–5.1)
Sodium: 133 mmol/L — ABNORMAL LOW (ref 135–145)

## 2021-09-06 LAB — RESP PANEL BY RT-PCR (FLU A&B, COVID) ARPGX2
Influenza A by PCR: NEGATIVE
Influenza B by PCR: NEGATIVE
SARS Coronavirus 2 by RT PCR: NEGATIVE

## 2021-09-06 MED ORDER — MORPHINE SULFATE (PF) 2 MG/ML IV SOLN
1.0000 mg | INTRAVENOUS | Status: DC | PRN
  Administered 2021-09-06: 1 mg via INTRAVENOUS
  Filled 2021-09-06: qty 1

## 2021-09-06 MED ORDER — INSULIN DETEMIR 100 UNIT/ML ~~LOC~~ SOLN
25.0000 [IU] | Freq: Two times a day (BID) | SUBCUTANEOUS | Status: DC
  Administered 2021-09-06 (×3): 25 [IU] via SUBCUTANEOUS
  Filled 2021-09-06 (×4): qty 0.25

## 2021-09-06 MED ORDER — PROPRANOLOL HCL 20 MG PO TABS
80.0000 mg | ORAL_TABLET | Freq: Two times a day (BID) | ORAL | Status: DC
  Administered 2021-09-06 – 2021-09-10 (×9): 80 mg via ORAL
  Filled 2021-09-06 (×9): qty 4

## 2021-09-06 MED ORDER — COLCHICINE 0.6 MG PO TABS
1.2000 mg | ORAL_TABLET | Freq: Once | ORAL | Status: AC
  Administered 2021-09-06: 1.2 mg via ORAL
  Filled 2021-09-06: qty 2

## 2021-09-06 MED ORDER — PAROXETINE HCL 20 MG PO TABS
40.0000 mg | ORAL_TABLET | ORAL | Status: DC
  Administered 2021-09-07 – 2021-09-10 (×4): 40 mg via ORAL
  Filled 2021-09-06 (×4): qty 2

## 2021-09-06 MED ORDER — OXYCODONE-ACETAMINOPHEN 5-325 MG PO TABS
1.0000 | ORAL_TABLET | ORAL | Status: DC | PRN
Start: 2021-09-06 — End: 2021-09-07
  Administered 2021-09-06 – 2021-09-07 (×6): 1 via ORAL
  Filled 2021-09-06 (×6): qty 1

## 2021-09-06 MED ORDER — LORAZEPAM 1 MG PO TABS: 1.0000 mg | ORAL_TABLET | ORAL | Status: DC | PRN

## 2021-09-06 MED ORDER — SODIUM CHLORIDE 0.9 % IV SOLN
2.0000 g | Freq: Three times a day (TID) | INTRAVENOUS | Status: DC
  Administered 2021-09-06 – 2021-09-07 (×5): 2 g via INTRAVENOUS
  Filled 2021-09-06 (×7): qty 2

## 2021-09-06 MED ORDER — PANTOPRAZOLE SODIUM 40 MG PO TBEC
40.0000 mg | DELAYED_RELEASE_TABLET | Freq: Two times a day (BID) | ORAL | Status: DC
  Administered 2021-09-06 – 2021-09-10 (×9): 40 mg via ORAL
  Filled 2021-09-06 (×9): qty 1

## 2021-09-06 MED ORDER — OXYCODONE-ACETAMINOPHEN 5-325 MG PO TABS: 1.0000 | ORAL_TABLET | ORAL | Status: DC | PRN

## 2021-09-06 MED ORDER — ROSUVASTATIN CALCIUM 10 MG PO TABS
10.0000 mg | ORAL_TABLET | Freq: Every day | ORAL | Status: DC
  Administered 2021-09-06 – 2021-09-10 (×5): 10 mg via ORAL
  Filled 2021-09-06 (×5): qty 1

## 2021-09-06 MED ORDER — ADULT MULTIVITAMIN W/MINERALS CH
1.0000 | ORAL_TABLET | Freq: Every day | ORAL | Status: DC
  Administered 2021-09-06 – 2021-09-09 (×4): 1 via ORAL
  Filled 2021-09-06 (×4): qty 1

## 2021-09-06 MED ORDER — POLYVINYL ALCOHOL 1.4 % OP SOLN
2.0000 [drp] | Freq: Every day | OPHTHALMIC | Status: DC
  Administered 2021-09-09: 2 [drp] via OPHTHALMIC
  Filled 2021-09-06: qty 15

## 2021-09-06 MED ORDER — LORAZEPAM 2 MG/ML IJ SOLN: 1.0000 mg | INTRAMUSCULAR | Status: DC | PRN

## 2021-09-06 MED ORDER — FUROSEMIDE 20 MG PO TABS
20.0000 mg | ORAL_TABLET | Freq: Every day | ORAL | Status: DC
  Administered 2021-09-06 – 2021-09-09 (×4): 20 mg via ORAL
  Filled 2021-09-06 (×5): qty 1

## 2021-09-06 MED ORDER — ASCORBIC ACID 500 MG PO TABS
1000.0000 mg | ORAL_TABLET | Freq: Every day | ORAL | Status: DC
  Administered 2021-09-06 – 2021-09-09 (×4): 1000 mg via ORAL
  Filled 2021-09-06 (×5): qty 2

## 2021-09-06 MED ORDER — COLCHICINE 0.6 MG PO TABS
0.6000 mg | ORAL_TABLET | Freq: Two times a day (BID) | ORAL | Status: DC
  Administered 2021-09-07 – 2021-09-09 (×6): 0.6 mg via ORAL
  Filled 2021-09-06 (×7): qty 1

## 2021-09-06 MED ORDER — LOSARTAN POTASSIUM 50 MG PO TABS
50.0000 mg | ORAL_TABLET | Freq: Every day | ORAL | Status: DC
  Administered 2021-09-06 – 2021-09-09 (×4): 50 mg via ORAL
  Filled 2021-09-06 (×5): qty 1

## 2021-09-06 MED ORDER — SPIRONOLACTONE 25 MG PO TABS
50.0000 mg | ORAL_TABLET | Freq: Every day | ORAL | Status: DC
  Administered 2021-09-06 – 2021-09-10 (×5): 50 mg via ORAL
  Filled 2021-09-06 (×5): qty 2

## 2021-09-06 MED ORDER — FERROUS SULFATE 325 (65 FE) MG PO TABS
325.0000 mg | ORAL_TABLET | Freq: Every day | ORAL | Status: DC
  Administered 2021-09-07 – 2021-09-10 (×4): 325 mg via ORAL
  Filled 2021-09-06 (×4): qty 1

## 2021-09-06 MED ORDER — VANCOMYCIN HCL 1500 MG/300ML IV SOLN
1500.0000 mg | INTRAVENOUS | Status: DC
  Administered 2021-09-07: 1500 mg via INTRAVENOUS
  Filled 2021-09-06: qty 300

## 2021-09-06 MED ORDER — VANCOMYCIN HCL 1750 MG/350ML IV SOLN
1750.0000 mg | Freq: Once | INTRAVENOUS | Status: AC
  Administered 2021-09-06: 1750 mg via INTRAVENOUS
  Filled 2021-09-06: qty 350

## 2021-09-06 MED ORDER — INSULIN ASPART 100 UNIT/ML IJ SOLN
0.0000 [IU] | Freq: Three times a day (TID) | INTRAMUSCULAR | Status: DC
  Administered 2021-09-06 (×2): 2 [IU] via SUBCUTANEOUS
  Administered 2021-09-08: 5 [IU] via SUBCUTANEOUS
  Administered 2021-09-08: 15 [IU] via SUBCUTANEOUS
  Administered 2021-09-08 – 2021-09-09 (×3): 8 [IU] via SUBCUTANEOUS
  Administered 2021-09-09: 3 [IU] via SUBCUTANEOUS
  Administered 2021-09-10: 2 [IU] via SUBCUTANEOUS
  Filled 2021-09-06 (×11): qty 1

## 2021-09-06 NOTE — H&P (Signed)
History and Physical    David [name] HYW:[id number] DOB: July 23, 1951 DOA: 09/05/2021  PCP: [name], MD  Patient coming from: Home  I have personally briefly reviewed patient's old medical records in [place]  Chief Complaint: Knee pain  HPI: David [name] is a 70 y.o. male with medical history significant for alcoholic cirrhosis, insulin-dependent type 2 diabetes, hypertension, CAD, hyperlipidemia and depression who presents with concerns of worsening knee pain.  Patient presented to the ER yesterday with knee and hip pain following a fall and had negative work-up with x-rays and was sent home with Percocet and Keflex for cellulitis of the right knee.  He then reports another fall earlier today when his slipper got caught on some equipment in the bathroom causing him to land on his knees.  He reports having recurrent weekly falls since about 8 months ago due to weakness of his legs and neuropathy.  states he recently had microdiscectomy back in July. Also occasionally feels dizziness with standing.  In the ED had repeat right knee x-ray showing anterior soft tissue swelling, negative left knee for fracture.  Given significant edema of the left knee he had aspiration in the ED showing calcium pyrophosphate crystals and significantly elevated WBC of >140,000 concerning for septic arthritis.  ED physician Dr. Archie Balboa discussed with orthopedic surgeon Dr. Sabra Heck who recommends admission to hospitalist and will follow up on synovial fluid cultures.   Review of Systems: Constitutional: No Weight Change, No Fever ENT/Mouth: No sore throat, No Rhinorrhea Eyes: No Eye Pain, No Vision Changes Cardiovascular: No Chest Pain, no SOB, + Edema Respiratory: No Cough, No Sputum,  Gastrointestinal: No Nausea, No Vomiting, No Diarrhea Genitourinary: no Urinary Incontinence Musculoskeletal: + Arthralgias, No Myalgias Skin: No Skin Lesions, No Pruritus, Neuro: + Weakness, No Numbness,  No  Loss of Consciousness, No Syncope Psych: No Anxiety/Panic, No Depression, no decrease appetite Heme/Lymph: No Bruising, No Bleeding  Past Medical History:  Diagnosis Date   Allergy    Anemia    Arthritis    Bilateral hands and feet   CAD (coronary artery disease)    s/p bifurcation stenting (DES x2) and stenting bifurcation lesion distal RCA (1 DES)   Cataract    Cirrhosis (HCC)    Depression    Diabetes mellitus    Esophageal varices (HCC)    GERD (gastroesophageal reflux disease)    Hyperlipemia    Hypertension    Myocardial infarction ([place])    Sleep apnea    CPAP    Past Surgical History:  Procedure Laterality Date   BIOPSY  09/01/2018   Procedure: BIOPSY;  Surgeon: Mauri Pole, MD;  Location: [facility];  Service: Endoscopy;;   CATARACT EXTRACTION Bilateral    CORONARY ANGIOPLASTY WITH STENT PLACEMENT   March 2010   Dr. Olevia Perches   ESOPHAGOGASTRODUODENOSCOPY (EGD) WITH PROPOFOL N/A 09/01/2018   Procedure: ESOPHAGOGASTRODUODENOSCOPY (EGD) WITH PROPOFOL;  Surgeon: Mauri Pole, MD;  Location: [facility];  Service: Endoscopy;  Laterality: N/A;   LEFT HEART CATH AND CORONARY ANGIOGRAPHY N/A 08/31/2018   Procedure: LEFT HEART CATH AND CORONARY ANGIOGRAPHY;  Surgeon: Sherren Mocha, MD;  Location: [facility];  Service: Cardiovascular;  Laterality: N/A;   THORACIC DISCECTOMY Right 06/19/2021   Procedure: Microdiscectomy - right - Thoracic Eleven-Thoracic Twelve;  Surgeon: Earnie Larsson, MD;  Location: [facility];  Service: Neurosurgery;  Laterality: Right;   TONSILLECTOMY  1959     reports that he quit smoking about 37 years ago. His smoking  use included cigarettes. He has never used smokeless tobacco. He reports current alcohol use of about 20.0 standard drinks per week. He reports that he does not use drugs. Social History  No Known Allergies  Family History  Problem Relation Age of Onset   Heart failure Father        Deceased 49 y/o   Diabetes Father     Cancer Father        Prostate   Heart failure Mother        Deceased 43 y/o   Alcohol abuse Mother    Schizophrenia Son        Paranoid   Uveitis Son    Cancer Brother        prostate   Colon cancer Neg Hx    Esophageal cancer Neg Hx    Stomach cancer Neg Hx    Liver cancer Neg Hx    Pancreatic cancer Neg Hx    Rectal cancer Neg Hx      Prior to Admission medications   Medication Sig Start Date End Date Taking? Authorizing Provider  ALPHA LIPOIC ACID PO Take 1 tablet by mouth daily.    [provider]  Ascorbic Acid (VITAMIN C) 1000 MG tablet Take 1,000 mg by mouth daily.    [provider]  cephALEXin (KEFLEX) 500 MG capsule Take 1 capsule (500 mg total) by mouth 3 (three) times daily for 10 days. 09/05/21 09/15/21  Caryn Section Linden Dolin, PA-C  Coenzyme Q10 (CO Q 10) 100 MG CAPS Take 100 mg by mouth daily.    [provider]  ferrous sulfate 325 (65 FE) MG tablet Take 325 mg by mouth daily with breakfast.    [provider]  furosemide (LASIX) 20 MG tablet Take 1 tablet by mouth once daily 03/31/21   Milus Banister, MD  glucose blood (ONE TOUCH ULTRA TEST) test strip Use to test blood sugar 2 times daily as instructed. Dx: E11.59, E11.65 10/07/15   Philemon Kingdom, MD  HYDROcodone-acetaminophen (NORCO) 10-325 MG tablet Take 1-2 tablets by mouth every 4 (four) hours as needed for severe pain ((score 7 to 10)). 06/20/21   Kristeen Miss, MD  insulin lispro (HUMALOG KWIKPEN) 200 UNIT/ML KwikPen INJECT 14-16 UNITS INTO THE SKIN THREE TIMES DAILY BEFORE MEALS 11/03/20   Philemon Kingdom, MD  insulin NPH Human (NOVOLIN N RELION) 100 UNIT/ML injection Inject under skin 30 units in a.m. and 35-40 units at bedtime 07/28/20   Philemon Kingdom, MD  Insulin Pen Needle 32G X 4 MM MISC Use 4x a day - with insulin 06/11/19   Philemon Kingdom, MD  Insulin Syringe-Needle U-100 (RELION INSULIN SYRINGE) 31G X 15/64" 0.5 ML MISC USE  TWICE DAILY 11/24/20   Philemon Kingdom, MD  losartan (COZAAR) 50 MG tablet Take 1 tablet (50 mg total) by mouth daily. Please make yearly appt with Dr. Burt Knack for October 2022 for future refills. Thank you 1st attempt 06/04/21   Loel Dubonnet, NP  metFORMIN (GLUCOPHAGE) 1000 MG tablet TAKE 1 TABLET BY MOUTH TWICE DAILY WITH MEALS 12/08/20   Philemon Kingdom, MD  Multiple Vitamin (MULTIVITAMIN WITH MINERALS) TABS tablet Take 1 tablet by mouth at bedtime.    [provider]  [facility] DELICA LANCETS FINE MISC Use to test blood sugar 2 times daily as instructed. Dx: E11.59, E11.65 10/07/15   Philemon Kingdom, MD  OVER THE COUNTER MEDICATION Take 2 capsules by mouth daily. Liver Support    [provider]  oxyCODONE-acetaminophen (  PERCOCET) 5-325 MG tablet Take 1 tablet by mouth every 4 (four) hours as needed for severe pain. 09/05/21 09/05/22  Caryn Section, Linden Dolin, PA-C  pantoprazole (PROTONIX) 40 MG tablet Take 1 tablet by mouth twice daily 06/23/20   [name], MD  PARoxetine (PAXIL) 40 MG tablet Take 1 tablet (40 mg total) by mouth every morning. 06/19/21   [name], MD  Polyethyl Glycol-Propyl Glycol (SYSTANE) 0.4-0.3 % SOLN Place 2 drops into both eyes daily.    [provider]  propranolol (INDERAL) 80 MG tablet Take 1 tablet (80 mg total) by mouth 2 (two) times daily. 12/30/20   Milus Banister, MD  rosuvastatin (CRESTOR) 10 MG tablet Take 1 tablet by mouth once daily 06/09/21   [name], MD  Semaglutide,0.25 or 0.5MG /DOS, (OZEMPIC, 0.25 OR 0.5 MG/DOSE,) 2 MG/1.5ML SOPN Inject 0.5 mg into the skin once a week. 09/02/20   Philemon Kingdom, MD  sildenafil (VIAGRA) 100 MG tablet TAKE 1 TABLET BY MOUTH ONCE DAILY AS NEEDED FOR ERECTILE DYSFUNCTION 02/12/21   [name], MD  spironolactone (ALDACTONE) 50 MG tablet Take 1 tablet by mouth once daily 07/07/21   Milus Banister, MD  triamcinolone cream (KENALOG) 0.1 % APPLY TOPICALLY TO SCALP TWO TIMES DAILY AS NEEDED 07/06/21   [name], MD    Physical Exam: Vitals:   09/05/21 2137 09/05/21 2139 09/05/21 2300 09/06/21 0000  BP: 111/89  (!) 155/96 (!) 144/78  Pulse: 79  85 88  Resp: 16  16 16   SpO2: 100%  98% 99%  Weight:  76.2 kg    Height:  5\' 9"  (1.753 m)      Constitutional: NAD, calm, comfortable Vitals:   09/05/21 2137 09/05/21 2139 09/05/21 2300 09/06/21 0000  BP: 111/89  (!) 155/96 (!) 144/78  Pulse: 79  85 88  Resp: 16  16 16   SpO2: 100%  98% 99%  Weight:  76.2 kg    Height:  5\' 9"  (1.753 m)     Eyes: PERRL, lids and conjunctivae normal ENMT: Mucous membranes are moist. Posterior pharynx clear of any exudate or lesions.Normal dentition.  Neck: normal, supple, no masses, no thyromegaly Respiratory: clear to auscultation bilaterally, no wheezing, no crackles. Normal respiratory effort. No accessory muscle use.  Cardiovascular: Regular rate and rhythm, no murmurs / rubs / gallops. No extremity edema. 2+ pedal pulses. No carotid bruits.  Abdomen: no tenderness, no masses palpated. No hepatosplenomegaly. Bowel sounds positive.  Musculoskeletal: no clubbing / cyanosis.  Right knee: Erythematous and edematous anterior patella region with several areas of skin abrasion.  Has increased warmth to touch. Left knee: Skin abrasion to patella region but no increased warmth or significant edema compared to the right.  However this is after aspiration of the knee. Left foot: Significant bruising on all digits which patient notes has been present for a month Right foot: Healing ulcer on the dorsal surface of the second toe. Skin: skin finding as above  Neurologic: CN 2-12 grossly intact. Sensation intact, Strength 5/5 in all 4.  Psychiatric: Normal judgment and insight. Alert and oriented x 3. Normal mood.     Labs on Admission: I have personally reviewed following labs and imaging studies  CBC: Recent Labs  Lab 09/05/21 2310  WBC 13.7*  NEUTROABS 10.5*  HGB 9.1*  HCT 26.7*  MCV 102.7*  PLT 118*    Basic Metabolic Panel: Recent Labs  Lab 09/05/21 2310  NA 131*  K 5.2*  CL 100  CO2 24  GLUCOSE 223*  BUN 22  CREATININE 1.08  CALCIUM 8.6*   GFR: Estimated Creatinine Clearance: 61.8 mL/min (by C-G formula based on SCr of 1.08 mg/dL). Liver Function Tests: No results for input(s): AST, ALT, ALKPHOS, BILITOT, PROT, ALBUMIN in the last 168 hours. No results for input(s): LIPASE, AMYLASE in the last 168 hours. No results for input(s): AMMONIA in the last 168 hours. Coagulation Profile: No results for input(s): INR, PROTIME in the last 168 hours. Cardiac Enzymes: No results for input(s): CKTOTAL, CKMB, CKMBINDEX, TROPONINI in the last 168 hours. BNP (last 3 results) No results for input(s): PROBNP in the last 8760 hours. HbA1C: No results for input(s): HGBA1C in the last 72 hours. CBG: No results for input(s): GLUCAP in the last 168 hours. Lipid Profile: No results for input(s): CHOL, HDL, LDLCALC, TRIG, CHOLHDL, LDLDIRECT in the last 72 hours. Thyroid Function Tests: No results for input(s): TSH, T4TOTAL, FREET4, T3FREE, THYROIDAB in the last 72 hours. Anemia Panel: No results for input(s): VITAMINB12, FOLATE, FERRITIN, TIBC, IRON, RETICCTPCT in the last 72 hours. Urine analysis:    Component Value Date/Time   COLORURINE DARK YELLOW 08/21/2021 1211   APPEARANCEUR CLEAR 08/21/2021 1211   LABSPEC 1.015 08/21/2021 1211   PHURINE 6.0 08/21/2021 1211   GLUCOSEU NEGATIVE 08/21/2021 1211   HGBUR NEGATIVE 08/21/2021 1211   KETONESUR NEGATIVE 08/21/2021 1211   PROTEINUR TRACE (A) 08/21/2021 1211   NITRITE NEGATIVE 08/21/2021 1211   LEUKOCYTESUR NEGATIVE 08/21/2021 1211    Radiological Exams on Admission: DG Elbow Complete Right  Result Date: 09/05/2021 CLINICAL DATA:  70 year old male status post falls. Pain. EXAM: RIGHT ELBOW - COMPLETE 3+ VIEW COMPARISON:  None. FINDINGS: No definite joint effusion. Preserved alignment at the elbow. Joint spaces are normal for age.  Mild degenerative spurring. Bone mineralization is within normal limits. Radial head appears intact. No acute osseous abnormality identified. No discrete soft tissue injury. IMPRESSION: No acute fracture or dislocation identified about the right elbow. Electronically Signed   By: Genevie Ann M.D.   On: 09/05/2021 06:36   DG Knee Complete 4 Views Left  Result Date: 09/05/2021 CLINICAL DATA:  70 year old male status post falls. Pain. EXAM: LEFT KNEE - COMPLETE 4+ VIEW COMPARISON:  No prior left knee series. FINDINGS: No evidence of joint effusion. Intact patella. Preserved alignment. Mild medial and lateral compartment joint degeneration, mild medial compartment joint space loss. No acute osseous abnormality identified. Small dystrophic calcification posterior to the knee. No discrete soft tissue injury. IMPRESSION: No acute fracture or dislocation identified about the left knee. Electronically Signed   By: Genevie Ann M.D.   On: 09/05/2021 06:37   DG Knee Complete 4 Views Right  Result Date: 09/05/2021 CLINICAL DATA:  70 year old male status post falls.  Pain. EXAM: RIGHT KNEE - COMPLETE 4+ VIEW COMPARISON:  Knee series 11/05/2013. FINDINGS: Anterior soft tissue swelling. No evidence of joint effusion. Intact patella. Preserved alignment. Chronic lateral compartment degenerative spurring. Mild joint space loss. No acute osseous abnormality identified. IMPRESSION: Anterior soft tissue swelling. No acute fracture or dislocation identified about the right knee. Electronically Signed   By: Genevie Ann M.D.   On: 09/05/2021 06:35   DG Hip Unilat W or Wo Pelvis 2-3 Views Right  Result Date: 09/05/2021 CLINICAL DATA:  70 year old male status post fall.  Pain. EXAM: DG HIP (WITH OR WITHOUT PELVIS) 2-3V RIGHT COMPARISON:  Pelvis radiograph 08/20/2019. FINDINGS: Pelvis appears stable and intact. Femoral heads remain normally located. Stable  proximal left femur. Proximal right femur appears intact. No acute osseous abnormality  identified. Small chronic dystrophic calcifications about the right hip. Negative visible lower abdominal and pelvic visceral contours. IMPRESSION: No acute fracture or dislocation identified about the right hip or pelvis. Electronically Signed   By: Genevie Ann M.D.   On: 09/05/2021 08:49      Assessment/Plan  Suspected left knee septic arthritis - Synovial fluid drawn showed pseudogout and greater than 140,000 WBC.  Cultures pending. Orthopedic Dr. Sabra Heck is aware. -Continue IV vancomycin and cefepime pending culture results  Pseudogout -Synovial fluid from left knee aspiration shows calcium pyrophosphate crystal - Start daily colchicine  Right knee cellulitis - Continue on IV vancomycin and cefepime - Wound care  Recurrent falls -suspect more due to neuropathy and LE weakness -PT eval  Mild hyperkalemia - Likely just transient.  Follow with repeat labs in the morning  Insulin-dependent type 2 diabetes with hyperglycemia -Last hemoglobin A1c of 6 on 9/23 - Home regimen includes Novolin N 30 units in the AM and 40 units PM. Mealtime sliding scale Humalog.  - Start with 25 units Levermir BID with moderate SSI   Alcoholic cirrhosis chronic thrombocytopenia - No liver decompensation on admission - Reports current alcohol use of 6 pack of beer at least twice weekly -place on CIWA Protocol  HTN  -Resume antihypertensives once medications are verified   DVT prophylaxis: SCDs Code Status: Full Family Communication: Plan discussed with patient at bedside  disposition Plan: Home with at least 2 midnight stays  Consults called:  Admission status: inpatient  Level of care: Med-Surg  Status is: Inpatient  Remains inpatient appropriate because:Inpatient level of care appropriate due to severity of illness  Dispo: The patient is from: Home              Anticipated d/c is to: Home              Patient currently is not medically stable to d/c.   Difficult to place patient  No         Orene Desanctis DO [facility]   If 7PM-7AM, please contact night-coverage [email or web address]   09/06/2021, 12:50 AM

## 2021-09-06 NOTE — Progress Notes (Deleted)
Bladder scan this morning showed 415cc. Once order placed for in and out catheter. Unable to place in and out 16 french due to resistance from prostate. Verbal order to try to place 14 french foley catheter due to patient going down to surgery for hip and unable urinate. This nurse tried placing 14 french foley but met resistance as well. 8 french foley received from ED. Dr. Posey Pronto notified urology to see if urology can place foley catheter. Dr. Bernardo Heater to come up to bedside for placement.

## 2021-09-06 NOTE — Evaluation (Signed)
Physical Therapy Evaluation Patient Details Name: David [name] States MRN: [id number] DOB: Jul 29, 1951 Today's Date: 09/06/2021  History of Present Illness  Pt is a 70 y/o M admitted on 09/05/21 after presenting with c/o worsening knee pain. Pt presented to the ED the day prior with c/o hip & knee pain following a fall & was sent home with medication for R knee cellulitis. Pt returned home & had another fall where he landed on his knees. In the ED had repeat right knee x-ray showing anterior soft tissue swelling, negative left knee for fracture.  Given significant edema of the left knee he had aspiration in the ED showing calcium pyrophosphate crystals and significantly elevated WBC of >140,000 concerning for septic arthritis. PMH: alcoholic cirrhosis, Insulin dependent Type 2 DM, HTN, CAD, HLD, depression, MI  Clinical Impression  Pt seen for PT evaluation with pt initially not agreeable but then agreeable to PT session. Pt endorses significant LLE pain despite being premedicated. Pt endorses hx of significant, frequent falls over the past 8 months (at least 1 fall/week). Pt currently requires significant assistance for bed mobility & is unable to transfer to standing 2/2 LLE pain. Pt is able to complete lateral scoots along EOB & squat pivot to recliner with min assist. Pt would benefit from further PT intervention to address bed mobility, transfers, & gait as able. Pt would benefit from STR upon d/c to maximize independence with functional mobility & reduce fall risk prior to return home.        Recommendations for follow up therapy are one component of a multi-disciplinary discharge planning process, led by the attending physician.  Recommendations may be updated based on patient status, additional functional criteria and insurance authorization.  Follow Up Recommendations SNF    Equipment Recommendations  None recommended by PT    Recommendations for Other Services       Precautions / Restrictions  Precautions Precautions: Fall Precaution Comments: ?back precautions (surgery on 06/20/21) Restrictions Weight Bearing Restrictions: No      Mobility  Bed Mobility Overal bed mobility: Needs Assistance Bed Mobility: Rolling;Sidelying to Sit Rolling: Min assist Sidelying to sit: Max assist;Mod assist;HOB elevated       General bed mobility comments: cuing for sequencing, requires 3 attempts to successfully sit EOB, use of bed rails    Transfers Overall transfer level: Needs assistance   Transfers: Squat Pivot Transfers;Sit to/from Stand Sit to Stand:  (attempted sit>stand from elevated EOB with max assist but pt unable to bear weight to achieve even partial transfer)   Squat pivot transfers: Min assist (education/cuing for hand placement & technique but pt able to complete squat pivot bed>recliner on L (higher>lower surface) with min assist)     General transfer comment: Pt is able to scoot to L along EOB with supervision with extra time & cuing to manevuer LLE to L with use of BUE then pt is able to scoot with RLE & BUE  Ambulation/Gait                Stairs            Wheelchair Mobility    Modified Rankin (Stroke Patients Only)       Balance Overall balance assessment: Needs assistance Sitting-balance support: Bilateral upper extremity supported;Feet supported;No upper extremity supported Sitting balance-Leahy Scale: Good  Pertinent Vitals/Pain Pain Assessment: Faces Faces Pain Scale: Hurts worst Pain Location: L knee Pain Descriptors / Indicators: Discomfort;Grimacing Pain Intervention(s): Premedicated before session;Monitored during session;Repositioned;Limited activity within patient's tolerance    Home Living Family/patient expects to be discharged to:: Private residence Living Arrangements: Non-relatives/Friends (roommate) Available Help at Discharge:  (none) Type of Home:  Apartment Home Access: Level entry     Home Layout: One level Home Equipment: Cane - quad;Grab bars - tub/shower;Hand held shower head;Walker - 2 wheels      Prior Function           Comments: Pt reports he used to ambulate with RW and QC, unsure what pt was using prior to admission. Pt endorses at least 1 fall a week for the past 8 months (pt's entire body covered in bruises).     Hand Dominance   Dominant Hand: Right    Extremity/Trunk Assessment   Upper Extremity Assessment Upper Extremity Assessment: Generalized weakness    Lower Extremity Assessment Lower Extremity Assessment: Generalized weakness (unable to formally test strength in L knee; pt with c/o increased pain with transition from flexion<>extension; edema noted to BLE knees (L>R))    Cervical / Trunk Assessment Cervical / Trunk Assessment:  (pt with bruising/skin tears to entire body)  Communication   Communication: No difficulties  Cognition Arousal/Alertness: Awake/alert Behavior During Therapy: WFL for tasks assessed/performed Overall Cognitive Status: Within Functional Limits for tasks assessed                                 General Comments: Slightly irritiable at beginning of session but a little more pleasant by end of session. Open to education.      General Comments      Exercises     Assessment/Plan    PT Assessment Patient needs continued PT services  PT Problem List Decreased mobility;Decreased strength;Decreased range of motion;Decreased activity tolerance;Decreased skin integrity;Pain;Decreased knowledge of use of DME;Decreased balance       PT Treatment Interventions DME instruction;Gait training;Balance training;Therapeutic exercise;Wheelchair mobility training;Stair training;Neuromuscular re-education;Functional mobility training;Therapeutic activities;Patient/family education;Modalities    PT Goals (Current goals can be found in the Care Plan section)  Acute  Rehab PT Goals Patient Stated Goal: decreased pain PT Goal Formulation: With patient Time For Goal Achievement: 09/20/21 Potential to Achieve Goals: Good    Frequency Min 2X/week   Barriers to discharge Decreased caregiver support      Co-evaluation               AM-PAC PT "6 Clicks" Mobility  Outcome Measure Help needed turning from your back to your side while in a flat bed without using bedrails?: A Lot Help needed moving from lying on your back to sitting on the side of a flat bed without using bedrails?: A Lot Help needed moving to and from a bed to a chair (including a wheelchair)?: A Little Help needed standing up from a chair using your arms (e.g., wheelchair or bedside chair)?: Total Help needed to walk in hospital room?: Total Help needed climbing 3-5 steps with a railing? : Total 6 Click Score: 10    End of Session Equipment Utilized During Treatment: Gait belt Activity Tolerance: Patient tolerated treatment well;Patient limited by pain Patient left: in chair;with chair alarm set;with call bell/phone within reach Nurse Communication: Mobility status PT Visit Diagnosis: Unsteadiness on feet (R26.81);History of falling (Z91.81);Difficulty in walking, not elsewhere classified (R26.2);Pain Pain - Right/Left:  Left Pain - part of body: Knee    Time: 8599-2341 PT Time Calculation (min) (ACUTE ONLY): 28 min   Charges:   PT Evaluation $PT Eval Moderate Complexity: 1 Mod PT Treatments $Therapeutic Activity: 8-22 mins        Lavone Nian, PT, DPT 09/06/21, 10:39 AM   Waunita Schooner 09/06/2021, 10:32 AM

## 2021-09-06 NOTE — Consult Note (Signed)
ORTHOPAEDIC CONSULTATION  REQUESTING PHYSICIAN: Fritzi Mandes, MD  Chief Complaint: Left knee pain and swelling  HPI: David [name] is a 70 y.o. male who complains of left knee pain and swelling after several falls in the last few weeks.  Patient is diabetic and has neuropathy.  He uses a walker.  I he has fallen repeatedly in the last few weeks and banged up both knees.  Left knee became swollen last week and he was seen in emergency room and placed on antibiotics but returned yesterday with increased pain in the knee.  Knee was aspirated and pyrophosphate crystals were identified.  White count was 145,000.  Patient has had mild low-grade fever of about 99.5.  he has not any fever or chills.  His white count has been unchanged at 13,300.  Past Medical History:  Diagnosis Date   Allergy    Anemia    Arthritis    Bilateral hands and feet   CAD (coronary artery disease)    s/p bifurcation stenting (DES x2) and stenting bifurcation lesion distal RCA (1 DES)   Cataract    Cirrhosis (HCC)    Depression    Diabetes mellitus    Esophageal varices (HCC)    GERD (gastroesophageal reflux disease)    Hyperlipemia    Hypertension    Myocardial infarction ([place])    Sleep apnea    CPAP   Past Surgical History:  Procedure Laterality Date   BIOPSY  09/01/2018   Procedure: BIOPSY;  Surgeon: Mauri Pole, MD;  Location: [facility];  Service: Endoscopy;;   CATARACT EXTRACTION Bilateral    CORONARY ANGIOPLASTY WITH STENT PLACEMENT   March 2010   Dr. Olevia Perches   ESOPHAGOGASTRODUODENOSCOPY (EGD) WITH PROPOFOL N/A 09/01/2018   Procedure: ESOPHAGOGASTRODUODENOSCOPY (EGD) WITH PROPOFOL;  Surgeon: Mauri Pole, MD;  Location: [facility];  Service: Endoscopy;  Laterality: N/A;   LEFT HEART CATH AND CORONARY ANGIOGRAPHY N/A 08/31/2018   Procedure: LEFT HEART CATH AND CORONARY ANGIOGRAPHY;  Surgeon: Sherren Mocha, MD;  Location: [facility];  Service: Cardiovascular;  Laterality: N/A;    THORACIC DISCECTOMY Right 06/19/2021   Procedure: Microdiscectomy - right - Thoracic Eleven-Thoracic Twelve;  Surgeon: Earnie Larsson, MD;  Location: [facility];  Service: Neurosurgery;  Laterality: Right;   TONSILLECTOMY  1959   Social History   Socioeconomic History   Marital status: Divorced    Spouse name: Not on file   Number of children: 2   Years of education: Not on file   Highest education level: Not on file  Occupational History   Occupation: Patent examiner buildings    Comment: site closed   Occupation: Engineering geologist    Comment: part time    Comment: Part time work  Tobacco Use   Smoking status: Former    Types: Cigarettes    Quit date: 11/30/1983    Years since quitting: 37.7   Smokeless tobacco: Never  Vaping Use   Vaping Use: Never used  Substance and Sexual Activity   Alcohol use: Yes    Alcohol/week: 20.0 standard drinks    Types: 20 Cans of beer per week    Comment: per pt 10-15 beers/week (as of 06/18/21)   Drug use: No   Sexual activity: Not Currently  Other Topics Concern   Not on file  Social History Narrative   No living will   Would want want son [name], as health care POA   Would accept resuscitation   Not sure about tube feeds--but  doesn't want if cognitively unaware   Social Determinants of Health   Financial Resource Strain: Not on file  Food Insecurity: Not on file  Transportation Needs: Not on file  Physical Activity: Not on file  Stress: Not on file  Social Connections: Not on file   Family History  Problem Relation Age of Onset   Heart failure Father        Deceased 36 y/o   Diabetes Father    Cancer Father        Prostate   Heart failure Mother        Deceased 79 y/o   Alcohol abuse Mother    Schizophrenia Son        Paranoid   Uveitis Son    Cancer Brother        prostate   Colon cancer Neg Hx    Esophageal cancer Neg Hx    Stomach cancer Neg Hx    Liver cancer Neg Hx    Pancreatic cancer Neg Hx    Rectal cancer Neg Hx     No Known Allergies Prior to Admission medications   Medication Sig Start Date End Date Taking? Authorizing Provider  Alpha Lipoic Acid 200 MG CAPS Take 1 capsule by mouth daily.   Yes [provider]  cephALEXin (KEFLEX) 500 MG capsule Take 1 capsule (500 mg total) by mouth 3 (three) times daily for 10 days. 09/05/21 09/15/21 Yes Fisher, Linden Dolin, PA-C  Coenzyme Q10 (CO Q 10) 100 MG CAPS Take 100 mg by mouth daily.   Yes [provider]  ferrous sulfate 325 (65 FE) MG tablet Take 325 mg by mouth daily with breakfast.   Yes [provider]  furosemide (LASIX) 20 MG tablet Take 1 tablet by mouth once daily 03/31/21  Yes Milus Banister, MD  glucose blood (ONE TOUCH ULTRA TEST) test strip Use to test blood sugar 2 times daily as instructed. Dx: E11.59, E11.65 10/07/15  Yes Philemon Kingdom, MD  HYDROcodone-acetaminophen (NORCO) 10-325 MG tablet Take 1-2 tablets by mouth every 4 (four) hours as needed for severe pain ((score 7 to 10)). 06/20/21  Yes Elsner, Mallie Mussel, MD  insulin NPH Human (NOVOLIN N RELION) 100 UNIT/ML injection Inject under skin 30 units in a.m. and 35-40 units at bedtime 07/28/20  Yes Philemon Kingdom, MD  Insulin Pen Needle 32G X 4 MM MISC Use 4x a day - with insulin 06/11/19  Yes Philemon Kingdom, MD  Insulin Syringe-Needle U-100 (RELION INSULIN SYRINGE) 31G X 15/64" 0.5 ML MISC USE  TWICE DAILY 11/24/20  Yes Philemon Kingdom, MD  losartan (COZAAR) 50 MG tablet Take 1 tablet (50 mg total) by mouth daily. Please make yearly appt with Dr. Burt Knack for October 2022 for future refills. Thank you 1st attempt 06/04/21  Yes Loel Dubonnet, NP  metFORMIN (GLUCOPHAGE) 1000 MG tablet TAKE 1 TABLET BY MOUTH TWICE DAILY WITH MEALS 12/08/20  Yes Philemon Kingdom, MD  Multiple Vitamin (MULTIVITAMIN WITH MINERALS) TABS tablet Take 1 tablet by mouth at bedtime.   Yes [provider]  [facility] DELICA LANCETS FINE MISC Use to test blood sugar 2 times daily as  instructed. Dx: E11.59, E11.65 10/07/15  Yes Philemon Kingdom, MD  OVER THE COUNTER MEDICATION Take 2 capsules by mouth daily. Liver Support   Yes [provider]  oxyCODONE-acetaminophen (PERCOCET) 5-325 MG tablet Take 1 tablet by mouth every 4 (four) hours as needed for severe pain. 09/05/21 09/05/22 Yes Fisher, Linden Dolin, PA-C  pantoprazole (PROTONIX)  40 MG tablet Take 1 tablet by mouth twice daily 06/23/20  Yes [name], MD  PARoxetine (PAXIL) 40 MG tablet Take 1 tablet (40 mg total) by mouth every morning. 06/19/21  Yes [name], MD  Polyethyl Glycol-Propyl Glycol (SYSTANE) 0.4-0.3 % SOLN Place 2 drops into both eyes daily.   Yes [provider]  propranolol (INDERAL) 80 MG tablet Take 1 tablet (80 mg total) by mouth 2 (two) times daily. 12/30/20  Yes Milus Banister, MD  rosuvastatin (CRESTOR) 10 MG tablet Take 1 tablet by mouth once daily 06/09/21  Yes [name], MD  sildenafil (VIAGRA) 100 MG tablet TAKE 1 TABLET BY MOUTH ONCE DAILY AS NEEDED FOR ERECTILE DYSFUNCTION 02/12/21  Yes [name], MD  spironolactone (ALDACTONE) 50 MG tablet Take 1 tablet by mouth once daily 07/07/21  Yes Milus Banister, MD  triamcinolone cream (KENALOG) 0.1 % APPLY TOPICALLY TO SCALP TWO TIMES DAILY AS NEEDED 07/06/21  Yes [name], MD  Ascorbic Acid (VITAMIN C) 1000 MG tablet Take 1,000 mg by mouth daily.    [provider]  insulin lispro (HUMALOG KWIKPEN) 200 UNIT/ML KwikPen INJECT 14-16 UNITS INTO THE SKIN THREE TIMES DAILY BEFORE MEALS Patient not taking: Reported on 09/06/2021 11/03/20   Philemon Kingdom, MD  Semaglutide,0.25 or 0.5MG /DOS, (OZEMPIC, 0.25 OR 0.5 MG/DOSE,) 2 MG/1.5ML SOPN Inject 0.5 mg into the skin once a week. Patient not taking: Reported on 09/06/2021 09/02/20   Philemon Kingdom, MD   DG Elbow Complete Right  Result Date: 09/05/2021 CLINICAL DATA:  70 year old male status post falls. Pain. EXAM: RIGHT ELBOW - COMPLETE 3+ VIEW  COMPARISON:  None. FINDINGS: No definite joint effusion. Preserved alignment at the elbow. Joint spaces are normal for age. Mild degenerative spurring. Bone mineralization is within normal limits. Radial head appears intact. No acute osseous abnormality identified. No discrete soft tissue injury. IMPRESSION: No acute fracture or dislocation identified about the right elbow. Electronically Signed   By: Genevie Ann M.D.   On: 09/05/2021 06:36   DG Knee Complete 4 Views Left  Result Date: 09/05/2021 CLINICAL DATA:  70 year old male status post falls. Pain. EXAM: LEFT KNEE - COMPLETE 4+ VIEW COMPARISON:  No prior left knee series. FINDINGS: No evidence of joint effusion. Intact patella. Preserved alignment. Mild medial and lateral compartment joint degeneration, mild medial compartment joint space loss. No acute osseous abnormality identified. Small dystrophic calcification posterior to the knee. No discrete soft tissue injury. IMPRESSION: No acute fracture or dislocation identified about the left knee. Electronically Signed   By: Genevie Ann M.D.   On: 09/05/2021 06:37   DG Knee Complete 4 Views Right  Result Date: 09/05/2021 CLINICAL DATA:  70 year old male status post falls.  Pain. EXAM: RIGHT KNEE - COMPLETE 4+ VIEW COMPARISON:  Knee series 11/05/2013. FINDINGS: Anterior soft tissue swelling. No evidence of joint effusion. Intact patella. Preserved alignment. Chronic lateral compartment degenerative spurring. Mild joint space loss. No acute osseous abnormality identified. IMPRESSION: Anterior soft tissue swelling. No acute fracture or dislocation identified about the right knee. Electronically Signed   By: Genevie Ann M.D.   On: 09/05/2021 06:35   DG Hip Unilat W or Wo Pelvis 2-3 Views Right  Result Date: 09/05/2021 CLINICAL DATA:  70 year old male status post fall.  Pain. EXAM: DG HIP (WITH OR WITHOUT PELVIS) 2-3V RIGHT COMPARISON:  Pelvis radiograph 08/20/2019. FINDINGS: Pelvis appears stable and intact. Femoral  heads remain normally located. Stable proximal left femur. Proximal right  femur appears intact. No acute osseous abnormality identified. Small chronic dystrophic calcifications about the right hip. Negative visible lower abdominal and pelvic visceral contours. IMPRESSION: No acute fracture or dislocation identified about the right hip or pelvis. Electronically Signed   By: Genevie Ann M.D.   On: 09/05/2021 08:49    Positive ROS: All other systems have been reviewed and were otherwise negative with the exception of those mentioned in the HPI and as above.  Physical Exam: General: Alert, no acute distress Cardiovascular: No pedal edema Respiratory: No cyanosis, no use of accessory musculature GI: No organomegaly, abdomen is soft and non-tender Skin: No lesions in the area of chief complaint Neurologic: Sensation intact distally Psychiatric: Patient is competent for consent with normal mood and affect Lymphatic: No axillary or cervical lymphadenopathy  MUSCULOSKELETAL: Patient is alert and awake and sitting up in a chair.  He has healing scabs over the front of both knees from repeated falls.  There is no redness or cellulitis in either knee.  Both knees are equally warm.  There is swelling in the left knee but not the right.  He has pain with movement of the left knee.  Skin is intact otherwise.  Patient has multiple skin scrapes and scabs over  the toes of both feet from falling.   Assessment: Left knee effusion: Probable pseudogout with the possibility of infection.  He is on IV antibiotics.  We are awaiting cultures.  Plan: Continue IV antibiotics until the cultures return. P.o. or IV steroids may be helpful after that. May need arthroscopic intervention if cultures are positive.    Park Breed, MD [phone]   09/06/2021 12:05 PM

## 2021-09-06 NOTE — Progress Notes (Signed)
Pharmacy Antibiotic Note  David [name] is a 70 y.o. male admitted on 09/05/2021 with wound infection.  Pharmacy has been consulted for Cefepime and Vancomycin dosing.  Plan: Cefepime 2 gm q8h per indication and renal fxn.  Vancomycin  Vanc LD of 1750 mg x 1 Vancomycin 1500 mg IV Q 24 hrs.  Goal AUC 400-550. Expected AUC: 490.7 SCr used: 1.08  Pharmacy will continue to follow and will adjusted abx dosing when warranted.  Height: 5\' 9"  (175.3 cm) Weight: 76.2 kg (168 lb) IBW/kg (Calculated) : 70.7  Temp (24hrs), Avg:99.6 F (37.6 C), Min:99.6 F (37.6 C), Max:99.6 F (37.6 C)  Recent Labs  Lab 09/05/21 2310  WBC 13.7*  CREATININE 1.08    Estimated Creatinine Clearance: 61.8 mL/min (by C-G formula based on SCr of 1.08 mg/dL).    No Known Allergies  Antimicrobials this admission: 10/9 Cefepime >>  10/9 Vancomycin >>   Microbiology results: 10/8 Body Fluid Cx (Knee): Pending  Thank you for allowing pharmacy to be a part of this patient's care.  Renda Rolls, PharmD, MBA 09/06/2021 1:04 AM

## 2021-09-06 NOTE — TOC Initial Note (Addendum)
Transition of Care (TOC) - Initial/Assessment Note    Patient Details  Name: David [name] MRN: [id number] Date of Birth: 09/02/1951  Transition of Care (TOC) CM/SW Contact:    Sarah C Boswell, LCSW Phone Number: 09/06/2021, 12:46 PM  Clinical Narrative:  CSW met with patient. No supports at bedside. CSW introduced role and explained that PT recommendations would be discussed. Patient is agreeable to SNF placement. Gave CMS scores for facilities within 25 miles of his zip code. PASARR under manual review. Asked MD to cosign FL2 and 30-day note. Patient agreeable to resources for substance use treatment. No further concerns. CSW encouraged patient to contact CSW as needed. CSW will continue to follow patient for support and facilitate discharge to SNF once medically stable.             2:27 pm: Uploaded requested documents into [facility] for PASARR review.     Expected Discharge Plan: Skilled Nursing Facility Barriers to Discharge: Awaiting State Approval (PASRR), Insurance Authorization, Continued Medical Work up   Patient Goals and CMS Choice   CMS Medicare.gov Compare Post Acute Care list provided to:: Patient    Expected Discharge Plan and Services Expected Discharge Plan: Skilled Nursing Facility     Post Acute Care Choice: Skilled Nursing Facility Living arrangements for the past 2 months: Apartment                                      Prior Living Arrangements/Services Living arrangements for the past 2 months: Apartment Lives with:: Roommate Patient language and need for interpreter reviewed:: Yes Do you feel safe going back to the place where you live?: Yes      Need for Family Participation in Patient Care: Yes (Comment) Care giver support system in place?: Yes (comment)   Criminal Activity/Legal Involvement Pertinent to Current Situation/Hospitalization: No - Comment as needed  Activities of Daily Living Home Assistive Devices/Equipment: Shower chair without  back, Walker (specify type) (2 wheel) ADL Screening (condition at time of admission) Patient's cognitive ability adequate to safely complete daily activities?: Yes Is the patient deaf or have difficulty hearing?: Yes Does the patient have difficulty seeing, even when wearing glasses/contacts?: No Does the patient have difficulty concentrating, remembering, or making decisions?: No Patient able to express need for assistance with ADLs?: Yes Does the patient have difficulty dressing or bathing?: No Independently performs ADLs?: Yes (appropriate for developmental age) Does the patient have difficulty walking or climbing stairs?: Yes Weakness of Legs: Both Weakness of Arms/Hands: None  Permission Sought/Granted Permission sought to share information with : Facility Contact Representative Permission granted to share information with : Yes, Verbal Permission Granted     Permission granted to share info w AGENCY: SNF's        Emotional Assessment Appearance:: Appears stated age Attitude/Demeanor/Rapport: Engaged, Gracious Affect (typically observed): Accepting, Appropriate, Calm, Pleasant Orientation: : Oriented to Self, Oriented to Place, Oriented to  Time, Oriented to Situation Alcohol / Substance Use: Alcohol Use Psych Involvement: No (comment)  Admission diagnosis:  Knee pain [M25.569] Acute pain of left knee [M25.562] Patient Active Problem List   Diagnosis Date Noted   Knee pain 09/06/2021   Pseudogout of knee, left 09/06/2021   Cellulitis 09/06/2021   Alcoholic cirrhosis of liver without ascites (HCC) 09/06/2021   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) 09/06/2021   Vasculitis (HCC) 08/21/2021   Contusion of right elbow   08/21/2021   Malnutrition of mild degree (HCC) 08/21/2021   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) 06/19/2021   Actinic keratoses 05/22/2021   BPH associated with nocturia 06/10/2020   Alcohol use with  alcohol-induced disorder (HCC) 12/11/2019   Hip pain 08/07/2019   History of esophageal varices 09/15/2018   Alcoholic cirrhosis of liver with ascites (HCC)    Secondary esophageal varices without bleeding (HCC)    Anemia 08/31/2018   Overweight 09/13/2017   Poorly controlled type 2 diabetes mellitus with circulatory disorder (HCC) 01/18/2017   Medial epicondylitis of elbow, left 08/03/2016   Elevated liver function tests 07/13/2016   Thrombocytopenia (HCC) 07/13/2016   Trigger middle finger of left hand 03/02/2016   Bilateral hand pain 12/02/2015   Primary osteoarthritis of both first carpometacarpal joints 12/02/2015   Advance directive discussed with patient 05/16/2015   Routine general medical examination at a health care facility 02/16/2011   Erectile dysfunction 02/16/2011   Essential hypertension 12/21/2010   Hyperlipidemia LDL goal <70 06/24/2009   Osteoarthritis, multiple sites 04/16/2009   CAD (coronary artery disease) 02/04/2009   Major depressive disorder, recurrent episode (HCC) 08/21/2008   ALLERGIC RHINITIS 08/21/2008   Sleep apnea 08/21/2008   PCP:  Letvak, Richard I, MD Pharmacy:   [id number] - [place], [place] - [address] [place] [address] Phone: [phone] Fax: [phone]     Social Determinants of Health (SDOH) Interventions    Readmission Risk Interventions No flowsheet data found.   

## 2021-09-06 NOTE — Progress Notes (Signed)
David [name] at [place] NAME: David [name]    MR#:  [id number]  DATE OF BIRTH:  Apr 15, 1951  SUBJECTIVE:  patient came in after repeated falls at home. He was seen in the emergency room two days ago but prescribed antibiotics to go home with. Complains of significant left knee pain unable to bend the knee. Fluid was aspirated which appears inflamed/infected. No fever.\ Lives with a roommate uses walker to walk at home. Has significant neuropathy due to diabetes leading to multiple falls  REVIEW OF SYSTEMS:   Review of Systems  Constitutional:  Negative for chills, fever and weight loss.  HENT:  Negative for ear discharge, ear pain and nosebleeds.   Eyes:  Negative for blurred vision, pain and discharge.  Respiratory:  Negative for sputum production, shortness of breath, wheezing and stridor.   Cardiovascular:  Negative for chest pain, palpitations, orthopnea and PND.  Gastrointestinal:  Negative for abdominal pain, diarrhea, nausea and vomiting.  Genitourinary:  Negative for frequency and urgency.  Musculoskeletal:  Negative for back pain and joint pain.  Neurological:  Negative for sensory change, speech change, focal weakness and weakness.  Psychiatric/Behavioral:  Negative for depression and hallucinations. The patient is not nervous/anxious.   Tolerating Diet:yes Tolerating PT: pending  DRUG ALLERGIES:  No Known Allergies  VITALS:  Blood pressure 115/64, pulse 85, temperature 99.8 F (37.7 C), resp. rate 18, height 5' 9.02" (1.753 m), weight 77.2 kg, SpO2 100 %.  PHYSICAL EXAMINATION:   Physical Exam  GENERAL:  70 y.o.-year-old patient lying in the bed with no acute distress.  LUNGS: Normal breath sounds bilaterally, no wheezing, rales, rhonchi. No use of accessory muscles of respiration.  CARDIOVASCULAR: S1, S2 normal. No murmurs, rubs, or gallops.  ABDOMEN: Soft, nontender, nondistended. Bowel sounds present. No organomegaly or  mass.  EXTREMITIES:  Decreased ROM left knee NEUROLOGIC: ++ sensory deficits b/l.   PSYCHIATRIC:  patient is alert and oriented x 3.  SKIN: as above  LABORATORY PANEL:  CBC Recent Labs  Lab 09/06/21 0421  WBC 13.3*  HGB 8.4*  HCT 24.8*  PLT 110*    Chemistries  Recent Labs  Lab 09/06/21 0421  NA 133*  K 4.8  CL 102  CO2 25  GLUCOSE 179*  BUN 22  CREATININE 1.03  CALCIUM 8.5*   Cardiac Enzymes No results for input(s): TROPONINI in the last 168 hours. RADIOLOGY:  DG Elbow Complete Right  Result Date: 09/05/2021 CLINICAL DATA:  70 year old male status post falls. Pain. EXAM: RIGHT ELBOW - COMPLETE 3+ VIEW COMPARISON:  None. FINDINGS: No definite joint effusion. Preserved alignment at the elbow. Joint spaces are normal for age. Mild degenerative spurring. Bone mineralization is within normal limits. Radial head appears intact. No acute osseous abnormality identified. No discrete soft tissue injury. IMPRESSION: No acute fracture or dislocation identified about the right elbow. Electronically Signed   By: Genevie Ann M.D.   On: 09/05/2021 06:36   DG Knee Complete 4 Views Left  Result Date: 09/05/2021 CLINICAL DATA:  70 year old male status post falls. Pain. EXAM: LEFT KNEE - COMPLETE 4+ VIEW COMPARISON:  No prior left knee series. FINDINGS: No evidence of joint effusion. Intact patella. Preserved alignment. Mild medial and lateral compartment joint degeneration, mild medial compartment joint space loss. No acute osseous abnormality identified. Small dystrophic calcification posterior to the knee. No discrete soft tissue injury. IMPRESSION: No acute fracture or dislocation identified about the left knee. Electronically Signed   By:  Genevie Ann M.D.   On: 09/05/2021 06:37   DG Knee Complete 4 Views Right  Result Date: 09/05/2021 CLINICAL DATA:  70 year old male status post falls.  Pain. EXAM: RIGHT KNEE - COMPLETE 4+ VIEW COMPARISON:  Knee series 11/05/2013. FINDINGS: Anterior soft tissue  swelling. No evidence of joint effusion. Intact patella. Preserved alignment. Chronic lateral compartment degenerative spurring. Mild joint space loss. No acute osseous abnormality identified. IMPRESSION: Anterior soft tissue swelling. No acute fracture or dislocation identified about the right knee. Electronically Signed   By: Genevie Ann M.D.   On: 09/05/2021 06:35   DG Hip Unilat W or Wo Pelvis 2-3 Views Right  Result Date: 09/05/2021 CLINICAL DATA:  70 year old male status post fall.  Pain. EXAM: DG HIP (WITH OR WITHOUT PELVIS) 2-3V RIGHT COMPARISON:  Pelvis radiograph 08/20/2019. FINDINGS: Pelvis appears stable and intact. Femoral heads remain normally located. Stable proximal left femur. Proximal right femur appears intact. No acute osseous abnormality identified. Small chronic dystrophic calcifications about the right hip. Negative visible lower abdominal and pelvic visceral contours. IMPRESSION: No acute fracture or dislocation identified about the right hip or pelvis. Electronically Signed   By: Genevie Ann M.D.   On: 09/05/2021 08:49   ASSESSMENT AND PLAN:  David [name] is a 70 y.o. male with medical history significant for alcoholic cirrhosis, insulin-dependent type 2 diabetes, hypertension, CAD, hyperlipidemia and depression who presents with concerns of worsening knee pain.   Suspected left knee septic arthritis - Synovial fluid drawn showed pseudogout and greater than 140,000 WBC.  --Knee fluid Cultures so far neg --. Orthopedic Dr. Sabra Heck to see pt -Continue IV vancomycin and cefepime pending culture results   Pseudogout -Synovial fluid from left knee aspiration shows calcium pyrophosphate crystal - Start daily colchicine   Right knee cellulitis - Continue on IV vancomycin and cefepime - Wound care   Recurrent falls -suspect more due to neuropathy and LE weakness -PT eval   Mild hyperkalemia - Likely just transient.  Follow with repeat labs in the morning   Insulin-dependent type  2 diabetes with hyperglycemia, peripheral neuropathy -Last hemoglobin A1c of 6 on 9/23 - Home regimen includes Novolin N 30 units in the AM and 40 units PM. Mealtime sliding scale Humalog.  - Start with 25 units Levermir BID with moderate SSI    Alcoholic cirrhosis chronic thrombocytopenia - No liver decompensation on admission - Reports current alcohol use of 6 pack of beer at least twice weekly -place on CIWA Protocol   HTN  -Resume antihypertensives once medications are verified     DVT prophylaxis: SCDs Code Status: Full Family Communication: Plan discussed with patient at bedside  disposition Plan: Home with at least 2 midnight stays  Consults called:  Admission status: inpatient   Level of care: Med-Surg   Status is: Inpatient   Remains inpatient appropriate because:Inpatient level of care appropriate due to severity of illness   Dispo: The patient is from: Home              Anticipated d/c is to: TBD              Patient currently is not medically stable to d/c.              Difficult to place patient No    TOC for d/c planning           TOTAL TIME TAKING CARE OF THIS PATIENT: 35 minutes.  >50% time spent on counselling and coordination of care  Note: This dictation was prepared with Dragon dictation along with smaller phrase technology. Any transcriptional errors that result from this process are unintentional.  Fritzi Mandes M.D    [facility]   CC: Primary care physician; [name], MD Patient ID: [name], male   DOB: 1951/06/05, 70 y.o.   MRN: [id number]

## 2021-09-06 NOTE — NC FL2 (Signed)
[place] LEVEL OF CARE SCREENING TOOL     IDENTIFICATION  Patient Name: David [name] Birthdate: [birth year] Sex: male Admission Date (Current Location): 09/05/2021  [facility] and [place] Number:  Engineering geologist and Address:  [facility], [address] [place], [place] [address]      Provider Number: [id number]  Attending Physician Name and Address:  Fritzi Mandes, MD  Relative Name and Phone Number:       Current Level of Care: Hospital Recommended Level of Care: [place] Prior Approval Number:    Date Approved/Denied:   PASRR Number: Manual review  Discharge Plan: SNF    Current Diagnoses: Patient Active Problem List   Diagnosis Date Noted   Knee pain 09/06/2021   Pseudogout of knee, left 09/06/2021   Cellulitis 78/24/2353   Alcoholic cirrhosis of liver without ascites ([place]) 09/06/2021   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 ([place]) 09/06/2021   Vasculitis ([place]) 08/21/2021   Contusion of right elbow 08/21/2021   Malnutrition of mild degree ([place]) 08/21/2021   Myelopathy concurrent with and due to spinal stenosis of thoracic region [facility]) 06/19/2021   Actinic keratoses 05/22/2021   BPH associated with nocturia 06/10/2020   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[place]) 12/11/2019   Hip pain 08/07/2019   History of esophageal varices 61/44/3154   Alcoholic cirrhosis of liver with ascites ([place])    Secondary esophageal varices without bleeding ([place])    Anemia 08/31/2018   Overweight 09/13/2017   Poorly controlled type 2 diabetes mellitus with circulatory disorder ([place]) 01/18/2017   Medial epicondylitis of elbow, left 08/03/2016   Elevated liver function tests 07/13/2016   Thrombocytopenia ([place]) 07/13/2016   Trigger middle finger of left hand 03/02/2016   Bilateral hand pain 12/02/2015   Primary osteoarthritis of both first carpometacarpal joints 12/02/2015   Advance directive discussed with patient  05/16/2015   Routine general medical examination at a health care facility 02/16/2011   Erectile dysfunction 02/16/2011   Essential hypertension 12/21/2010   Hyperlipidemia LDL goal <70 06/24/2009   Osteoarthritis, multiple sites 04/16/2009   CAD (coronary artery disease) 02/04/2009   Major depressive disorder, recurrent episode ([place]) 08/21/2008   ALLERGIC RHINITIS 08/21/2008   Sleep apnea 08/21/2008    Orientation RESPIRATION BLADDER Height & Weight     Self, Time, Situation, Place  Normal Continent Weight: 170 lb 3.1 oz (77.2 kg) Height:  5' 9.02" (175.3 cm)  BEHAVIORAL SYMPTOMS/MOOD NEUROLOGICAL BOWEL NUTRITION STATUS   (None)  (None) Continent Diet (Heart healthy/carb modified)  AMBULATORY STATUS COMMUNICATION OF NEEDS Skin     Verbally Skin abrasions, Bruising                       Personal Care Assistance Level of Assistance              Functional Limitations Info  Sight, Hearing, Speech Sight Info: Adequate Hearing Info: Adequate Speech Info: Adequate    SPECIAL CARE FACTORS FREQUENCY  PT (By licensed PT)     PT Frequency: 5 x week              Contractures Contractures Info: Not present    Additional Factors Info  Code Status, Allergies Code Status Info: Full code Allergies Info: NKDA           Current Medications (09/06/2021):  This is the current hospital active medication list Current Facility-Administered Medications  Medication Dose Route Frequency Provider Last Rate Last Admin  ceFEPIme (MAXIPIME) 2 g in sodium chloride 0.9 % 100 mL IVPB  2 g Intravenous Q8H Renda Rolls, RPH 200 mL/hr at 09/06/21 0910 2 g at 09/06/21 0910   [START ON 09/07/2021] colchicine tablet 0.6 mg  0.6 mg Oral BID Tu, Ching T, DO       insulin aspart (novoLOG) injection 0-15 Units  0-15 Units Subcutaneous TID WC Tu, Ching T, DO   2 Units at 09/06/21 1210   insulin detemir (LEVEMIR) injection 25 Units  25 Units Subcutaneous BID Tu, Ching T, DO   25 Units at  09/06/21 5364   LORazepam (ATIVAN) tablet 1-4 mg  1-4 mg Oral Q1H PRN Tu, Ching T, DO       Or   LORazepam (ATIVAN) injection 1-4 mg  1-4 mg Intravenous Q1H PRN Tu, Ching T, DO       morphine 2 MG/ML injection 1 mg  1 mg Intravenous Q4H PRN Tu, Ching T, DO   1 mg at 09/06/21 0230   oxyCODONE-acetaminophen (PERCOCET/ROXICET) 5-325 MG per tablet 1 tablet  1 tablet Oral Q4H PRN Tu, Ching T, DO   1 tablet at 09/06/21 0910   [START ON 09/07/2021] vancomycin (VANCOREADY) IVPB 1500 mg/300 mL  1,500 mg Intravenous Q24H Renda Rolls, [facility]         Discharge Medications: Please see discharge summary for a list of discharge medications.  Relevant Imaging Results:  Relevant Lab Results:   Additional Information SS#: [id number]. COVID Vaccines: Pfizer 01/28/20, 02/28/20. Also reports one booster.  Candie Chroman, LCSW

## 2021-09-06 NOTE — TOC CM/SW Note (Signed)
RE: David [name] Date of Birth: 11-Sep-1951 Date: 09/06/2021   To Whom It May Concern:  Please be advised that the above-named patient will require a short-term nursing home stay - anticipated 30 days or less for rehabilitation and strengthening.  The plan is for return home.

## 2021-09-06 NOTE — Plan of Care (Signed)
New admit from ED, complains of severe pain to bilateral knees. Patient status post multiple falls, needle aspiration performed to left knee in the ED. Vitals stable, no respiratory distress on room air. CIWA protocol initiated due to ETOH history. Will continue to monitor.  Problem: Education: Goal: Knowledge of General Education information will improve Description: Including pain rating scale, medication(s)/side effects and non-pharmacologic comfort measures Outcome: Progressing   Problem: Health Behavior/Discharge Planning: Goal: Ability to manage health-related needs will improve Outcome: Progressing   Problem: Clinical Measurements: Goal: Ability to maintain clinical measurements within normal limits will improve Outcome: Progressing Goal: Will remain free from infection Outcome: Progressing Goal: Diagnostic test results will improve Outcome: Progressing Goal: Respiratory complications will improve Outcome: Progressing Goal: Cardiovascular complication will be avoided Outcome: Progressing   Problem: Activity: Goal: Risk for activity intolerance will decrease Outcome: Progressing   Problem: Nutrition: Goal: Adequate nutrition will be maintained Outcome: Progressing   Problem: Coping: Goal: Level of anxiety will decrease Outcome: Progressing

## 2021-09-07 ENCOUNTER — Inpatient Hospital Stay: Payer: Medicare Other | Admitting: Anesthesiology

## 2021-09-07 ENCOUNTER — Encounter
Admission: EM | Disposition: A | Discharge status: Skilled Nursing Facility | Payer: Self-pay | Source: Home / Self Care | Attending: Internal Medicine

## 2021-09-07 ENCOUNTER — Encounter: Payer: Self-pay | Admitting: Family Medicine

## 2021-09-07 DIAGNOSIS — M00062 Staphylococcal arthritis, left knee: Secondary | ICD-10-CM

## 2021-09-07 DIAGNOSIS — M11262 Other chondrocalcinosis, left knee: Secondary | ICD-10-CM

## 2021-09-07 HISTORY — PX: KNEE ARTHROSCOPY: SHX127

## 2021-09-07 LAB — GLUCOSE, CAPILLARY
Glucose-Capillary: 100 mg/dL — ABNORMAL HIGH (ref 70–99)
Glucose-Capillary: 130 mg/dL — ABNORMAL HIGH (ref 70–99)
Glucose-Capillary: 144 mg/dL — ABNORMAL HIGH (ref 70–99)
Glucose-Capillary: 198 mg/dL — ABNORMAL HIGH (ref 70–99)
Glucose-Capillary: 204 mg/dL — ABNORMAL HIGH (ref 70–99)
Glucose-Capillary: 314 mg/dL — ABNORMAL HIGH (ref 70–99)
Glucose-Capillary: 34 mg/dL — CL (ref 70–99)
Glucose-Capillary: 58 mg/dL — ABNORMAL LOW (ref 70–99)

## 2021-09-07 LAB — CREATININE, SERUM
Creatinine, Ser: 1.24 mg/dL (ref 0.61–1.24)
GFR, Estimated: >60 mL/min (ref >60)

## 2021-09-07 LAB — LD, BODY FLUID (OTHER): LD, Body Fluid: 3609 IU/L

## 2021-09-07 LAB — GLUCOSE, BODY FLUID OTHER: Glucose, Body Fluid Other: 5 mg/dL

## 2021-09-07 SURGERY — ARTHROSCOPY, KNEE
Anesthesia: General | Site: Knee | Laterality: Left

## 2021-09-07 MED ORDER — CEFAZOLIN SODIUM-DEXTROSE 2-4 GM/100ML-% IV SOLN
INTRAVENOUS | Status: AC
  Filled 2021-09-07: qty 100

## 2021-09-07 MED ORDER — ORAL CARE MOUTH RINSE: 15.0000 mL | Freq: Once | OROMUCOSAL | Status: DC

## 2021-09-07 MED ORDER — ROCURONIUM BROMIDE 100 MG/10ML IV SOLN
INTRAVENOUS | Status: DC | PRN
  Administered 2021-09-07: 40 mg via INTRAVENOUS

## 2021-09-07 MED ORDER — CLINDAMYCIN PHOSPHATE 600 MG/50ML IV SOLN
INTRAVENOUS | Status: AC
  Filled 2021-09-07: qty 50

## 2021-09-07 MED ORDER — METOCLOPRAMIDE HCL 5 MG/ML IJ SOLN: 5.0000 mg | Freq: Three times a day (TID) | INTRAMUSCULAR | Status: DC | PRN

## 2021-09-07 MED ORDER — PHENYLEPHRINE HCL (PRESSORS) 10 MG/ML IV SOLN
INTRAVENOUS | Status: DC | PRN
Start: 2021-09-07 — End: 2021-09-07
  Administered 2021-09-07: 200 ug via INTRAVENOUS
  Administered 2021-09-07 (×2): 100 ug via INTRAVENOUS
  Administered 2021-09-07: 200 ug via INTRAVENOUS

## 2021-09-07 MED ORDER — OXYCODONE HCL 5 MG PO TABS
5.0000 mg | ORAL_TABLET | ORAL | Status: DC | PRN
  Administered 2021-09-08 – 2021-09-10 (×9): 5 mg via ORAL
  Filled 2021-09-07 (×9): qty 1

## 2021-09-07 MED ORDER — LIDOCAINE HCL (CARDIAC) PF 100 MG/5ML IV SOSY
PREFILLED_SYRINGE | INTRAVENOUS | Status: DC | PRN
Start: 2021-09-07 — End: 2021-09-07
  Administered 2021-09-07: 100 mg via INTRAVENOUS

## 2021-09-07 MED ORDER — BUPIVACAINE-EPINEPHRINE (PF) 0.5% -1:200000 IJ SOLN
INTRAMUSCULAR | Status: AC
  Filled 2021-09-07: qty 30

## 2021-09-07 MED ORDER — CHLORHEXIDINE GLUCONATE 0.12 % MT SOLN: 15.0000 mL | Freq: Once | OROMUCOSAL | Status: DC

## 2021-09-07 MED ORDER — PHENYLEPHRINE HCL-NACL 20-0.9 MG/250ML-% IV SOLN
INTRAVENOUS | Status: DC | PRN
  Administered 2021-09-07: 50 ug/min via INTRAVENOUS

## 2021-09-07 MED ORDER — ONDANSETRON HCL 4 MG/2ML IJ SOLN
INTRAMUSCULAR | Status: DC | PRN
  Administered 2021-09-07: 4 mg via INTRAVENOUS

## 2021-09-07 MED ORDER — SUGAMMADEX SODIUM 200 MG/2ML IV SOLN
INTRAVENOUS | Status: DC | PRN
Start: 2021-09-07 — End: 2021-09-07
  Administered 2021-09-07: 200 mg via INTRAVENOUS

## 2021-09-07 MED ORDER — ONDANSETRON HCL 4 MG/2ML IJ SOLN
4.0000 mg | Freq: Four times a day (QID) | INTRAMUSCULAR | Status: DC | PRN
  Administered 2021-09-07 – 2021-09-10 (×4): 4 mg via INTRAVENOUS
  Filled 2021-09-07 (×4): qty 2

## 2021-09-07 MED ORDER — MIDAZOLAM HCL 2 MG/2ML IJ SOLN
INTRAMUSCULAR | Status: AC
  Filled 2021-09-07: qty 2

## 2021-09-07 MED ORDER — SODIUM CHLORIDE 0.9 % IV SOLN
2.0000 g | Freq: Two times a day (BID) | INTRAVENOUS | Status: DC
  Filled 2021-09-07: qty 2

## 2021-09-07 MED ORDER — MORPHINE SULFATE (PF) 4 MG/ML IV SOLN
INTRAVENOUS | Status: AC
  Filled 2021-09-07: qty 1

## 2021-09-07 MED ORDER — MIDAZOLAM HCL 2 MG/2ML IJ SOLN
INTRAMUSCULAR | Status: DC | PRN
Start: 2021-09-07 — End: 2021-09-07
  Administered 2021-09-07: 2 mg via INTRAVENOUS

## 2021-09-07 MED ORDER — CHLORHEXIDINE GLUCONATE 0.12 % MT SOLN
OROMUCOSAL | Status: AC
  Filled 2021-09-07: qty 15

## 2021-09-07 MED ORDER — PROPOFOL 10 MG/ML IV BOLUS
INTRAVENOUS | Status: DC | PRN
  Administered 2021-09-07: 130 mg via INTRAVENOUS

## 2021-09-07 MED ORDER — VANCOMYCIN HCL 1250 MG/250ML IV SOLN
1250.0000 mg | INTRAVENOUS | Status: DC
  Administered 2021-09-08: 1250 mg via INTRAVENOUS
  Filled 2021-09-07: qty 250

## 2021-09-07 MED ORDER — DEXTROSE 50 % IV SOLN: 50.0000 mL | INTRAVENOUS | Status: DC | PRN

## 2021-09-07 MED ORDER — CLINDAMYCIN PHOSPHATE 600 MG/50ML IV SOLN
INTRAVENOUS | Status: DC | PRN
  Administered 2021-09-07: 600 mg via INTRAVENOUS

## 2021-09-07 MED ORDER — HYDROCODONE-ACETAMINOPHEN 5-325 MG PO TABS
1.0000 | ORAL_TABLET | ORAL | Status: DC | PRN
Start: 2021-09-07 — End: 2021-09-10
  Administered 2021-09-07: 1 via ORAL
  Filled 2021-09-07: qty 1

## 2021-09-07 MED ORDER — SEVOFLURANE IN SOLN
RESPIRATORY_TRACT | Status: AC
  Filled 2021-09-07: qty 250

## 2021-09-07 MED ORDER — METOCLOPRAMIDE HCL 10 MG PO TABS: 5.0000 mg | ORAL_TABLET | Freq: Three times a day (TID) | ORAL | Status: DC | PRN

## 2021-09-07 MED ORDER — SODIUM CHLORIDE 0.9 % IV SOLN: INTRAVENOUS | Status: DC

## 2021-09-07 MED ORDER — BUPIVACAINE-EPINEPHRINE (PF) 0.25% -1:200000 IJ SOLN
INTRAMUSCULAR | Status: AC
  Filled 2021-09-07: qty 30

## 2021-09-07 MED ORDER — FENTANYL CITRATE (PF) 100 MCG/2ML IJ SOLN
INTRAMUSCULAR | Status: DC | PRN
  Administered 2021-09-07 (×2): 50 ug via INTRAVENOUS

## 2021-09-07 MED ORDER — DEXAMETHASONE SODIUM PHOSPHATE 10 MG/ML IJ SOLN
INTRAMUSCULAR | Status: DC | PRN
  Administered 2021-09-07: 10 mg via INTRAVENOUS

## 2021-09-07 MED ORDER — RINGERS IRRIGATION IR SOLN
Status: DC | PRN
  Administered 2021-09-07: 1

## 2021-09-07 MED ORDER — GABAPENTIN 400 MG PO CAPS: 400.0000 mg | ORAL_CAPSULE | ORAL | Status: AC

## 2021-09-07 MED ORDER — MELOXICAM 7.5 MG PO TABS
15.0000 mg | ORAL_TABLET | ORAL | Status: AC
  Filled 2021-09-07: qty 2

## 2021-09-07 MED ORDER — BUPIVACAINE-EPINEPHRINE (PF) 0.5% -1:200000 IJ SOLN
INTRAMUSCULAR | Status: DC | PRN
  Administered 2021-09-07: 30 mL via PERINEURAL

## 2021-09-07 MED ORDER — CEFAZOLIN SODIUM-DEXTROSE 2-3 GM-%(50ML) IV SOLR
INTRAVENOUS | Status: DC | PRN
  Administered 2021-09-07: 2 g via INTRAVENOUS

## 2021-09-07 MED ORDER — FENTANYL CITRATE (PF) 100 MCG/2ML IJ SOLN
INTRAMUSCULAR | Status: AC
  Filled 2021-09-07: qty 2

## 2021-09-07 MED ORDER — BUPIVACAINE-EPINEPHRINE (PF) 0.5% -1:200000 IJ SOLN
INTRAMUSCULAR | Status: AC
  Filled 2021-09-07: qty 60

## 2021-09-07 SURGICAL SUPPLY — 37 items
ADAPTER IRRIG TUBE 2 SPIKE SOL (ADAPTER) ×4 IMPLANT
ADPR TBG 2 SPK PMP STRL ASCP (ADAPTER) ×2
APL PRP STRL LF DISP 70% ISPRP (MISCELLANEOUS) ×1
BLADE AGGRESSIVE PLUS 4.0 (BLADE) IMPLANT
BUR RADIUS 4.0X18.5 (BURR) ×2 IMPLANT
CHLORAPREP W/TINT 26 (MISCELLANEOUS) ×2 IMPLANT
CUFF TOURN SGL QUICK 24 (TOURNIQUET CUFF)
CUFF TOURN SGL QUICK 34 (TOURNIQUET CUFF)
CUFF TRNQT CYL 24X4X16.5-23 (TOURNIQUET CUFF) IMPLANT
CUFF TRNQT CYL 34X4.125X (TOURNIQUET CUFF) IMPLANT
CUTTER SLOTTED WHISKER 4.0 (BURR) IMPLANT
GAUZE SPONGE 4X4 12PLY STRL (GAUZE/BANDAGES/DRESSINGS) ×4 IMPLANT
GLOVE SURG ENC MOIS LTX SZ8 (GLOVE) ×2 IMPLANT
GOWN STRL REUS W/ TWL LRG LVL3 (GOWN DISPOSABLE) ×2 IMPLANT
GOWN STRL REUS W/TWL LRG LVL3 (GOWN DISPOSABLE) ×4
GOWN STRL REUS W/TWL LRG LVL4 (GOWN DISPOSABLE) ×2 IMPLANT
HEMOVAC 400CC 10FR (MISCELLANEOUS) ×1 IMPLANT
IMMBOLIZER KNEE 19 BLUE UNIV (SOFTGOODS) ×1 IMPLANT
IV LACTATED RINGER IRRG 3000ML (IV SOLUTION) ×12
IV LR IRRIG 3000ML ARTHROMATIC (IV SOLUTION) ×6 IMPLANT
KIT TURNOVER KIT A (KITS) ×2 IMPLANT
MANIFOLD NEPTUNE II (INSTRUMENTS) ×4 IMPLANT
MAT ABSORB  FLUID 56X50 GRAY (MISCELLANEOUS) ×2
MAT ABSORB FLUID 56X50 GRAY (MISCELLANEOUS) ×1 IMPLANT
NDL SAFETY ECLIPSE 18X1.5 (NEEDLE) ×2 IMPLANT
NEEDLE HYPO 18GX1.5 SHARP (NEEDLE) ×4
PACK ARTHROSCOPY KNEE (MISCELLANEOUS) ×2 IMPLANT
SOL PREP PVP 2OZ (MISCELLANEOUS) ×2
SOLUTION PREP PVP 2OZ (MISCELLANEOUS) ×1 IMPLANT
SPONGE T-LAP 18X18 ~~LOC~~+RFID (SPONGE) ×2 IMPLANT
STOCKINETTE BIAS CUT 6 980064 (GAUZE/BANDAGES/DRESSINGS) ×2 IMPLANT
SUT ETHILON 3 0 FSLX (SUTURE) ×2 IMPLANT
SYR 30ML LL (SYRINGE) ×4 IMPLANT
TUBING INFLOW SET DBFLO PUMP (TUBING) ×2 IMPLANT
TUBING OUTFLOW SET DBLFO PUMP (TUBING) ×2 IMPLANT
WAND COBLATION FLOW 50 (SURGICAL WAND) ×2 IMPLANT
WATER STERILE IRR 500ML POUR (IV SOLUTION) ×2 IMPLANT

## 2021-09-07 NOTE — Progress Notes (Signed)
Subjective: Day of Surgery Procedure(s) (LRB): ARTHROSCOPY KNEE (Left)   The laboratory the culture report came back showing a rare Staph aureus in the knee fluid drawn in the ER.  He still has significant swelling.  There is less pain.  There is not much redness or increased warmth.  He is afebrile.  His white count was still elevated at 13.3 yesterday.  Given the increased amount of fluid in the knee I think it makes sense to wash this out arthroscopically today.  I discussed this with the patient and recommended this treatment to try and get the infection under control.  He is agreeable to this.  We will plan to do this later this afternoon.  Patient reports pain as mild.  Objective:   VITALS:   Vitals:   09/07/21 0427 09/07/21 0905  BP: (!) 103/59 128/62  Pulse: 70 82  Resp: 16 20  Temp: 99.8 F (37.7 C) 98.2 F (36.8 C)  SpO2: 100% 100%    Neurologically intact Left knee is still swollen.  Not much redness.  He is mildly tender.  Still has scabs over the anterior tibia.  These do not look infected.  There is also scabs over the right knee which do not look infected.  Neurovascular status is normal for him otherwise.  LABS Recent Labs    09/05/21 2310 09/06/21 0421  HGB 9.1* 8.4*  HCT 26.7* 24.8*  WBC 13.7* 13.3*  PLT 118* 110*    Recent Labs    09/05/21 2310 09/06/21 0421 09/07/21 0859  NA 131* 133*  --   K 5.2* 4.8  --   BUN 22 22  --   CREATININE 1.08 1.03 1.24  GLUCOSE 223* 179*  --     No results for input(s): LABPT, INR in the last 72 hours.   Assessment/Plan: Day of Surgery Procedure(s) (LRB): ARTHROSCOPY KNEE (Left)    the patient will undergo arthroscopic surgery for the left knee later this afternoon.

## 2021-09-07 NOTE — Progress Notes (Addendum)
Inpatient Diabetes Program Recommendations  AACE/ADA: New Consensus Statement on Inpatient Glycemic Control (2015)  Target Ranges:  Prepandial:   less than 140 mg/dL      Peak postprandial:   less than 180 mg/dL (1-2 hours)      Critically ill patients:  140 - 180 mg/dL   Lab Results  Component Value Date   GLUCAP 100 (H) 09/07/2021   HGBA1C 6.0 (A) 08/21/2021    Review of Glycemic Control Results for David [name], David [name] (MRN [id number]) as of 09/07/2021 11:04  Ref. Range 09/06/2021 16:57 09/06/2021 20:35 09/07/2021 07:33 09/07/2021 07:47 09/07/2021 08:01  Glucose-Capillary Latest Ref Range: 70 - 99 mg/dL 89 214 (H) 34 (LL) 58 (L) 100 (H)   Diabetes history: DM 2 Outpatient Diabetes medications:  Humalog 14-16 units tid, NPH 30 units q AM and NPH 35-40 units q PM, Metformin 1000 mg bid Current orders for Inpatient glycemic control:  Novolog 0-15 units tid with meals  Inpatient Diabetes Program Recommendations:    Patient is currently NPO.  Note low blood sugar this AM. A1C indicates fairly tight glucose control prior to admit.   Consider adding Levemir 15 units bid and changing Novolog correction to Novolog sensitive q 4 hours while NPO.   ? If patient was having lows at home prior to admit as he was on basal and bolus insulin at home prior to admit.  Will f/u with patient to discuss.   Thanks  Adah Perl, RN, BC-ADM Inpatient Diabetes Coordinator Pager [phone]  (8a-5p)  Addendum 12:30 pm- Spoke with patient at bedside.  He see's Dr. Cruzita Lederer for his diabetes.  He states that he was having some low blood sugars so they stopped the Ozempic.  He does not think he was having lows at home but is not sure.  Discussed possibility of CGM for monitoring.  He states that Dr. Cruzita Lederer has mentioned this in the past and that he just never got it.  Told him we could supply sample at d/c and then he will need to f/u with Dr. Cruzita Lederer for further prescriptions if okay with hospitalist.  Patient is  agreeable. Will follow and also give patient information regarding CGM for him to review.  Discussed importance of avoiding low blood sugars with patient.  Gave patient handout regarding [employer] CGM.

## 2021-09-07 NOTE — Progress Notes (Signed)
Patient alert and oriented x 4, complains of severe pain to left knee verbalized that he has decreased ROM to the extremity. Remains on PIV antibiotics. No signs of withdrawal with CIWA assessment. Vitals stable on room air no distress. Will continue to monitor.

## 2021-09-07 NOTE — Progress Notes (Signed)
Pharmacy Antibiotic Note  David [name] is a 70 y.o. male admitted on 09/05/2021 with left knee septic arthritis/cellulitis.  Pharmacy has been consulted for Cefepime and Vancomycin dosing.  Day 2 antibiotics  Plan: Cefepime decreased to 2 gm q12h per indication and renal fxn.  Vancomycin 1750 mg LD x 1, followed by 1250 mg IV q24h Goal AUC 400-550  Est AUC: 459.3 Est Cmax: 32.6 Est Cmin: 10.8 Calculated with SCr 1.24  Monitor clinical picture, renal function, and vancomycin levels at steady state F/U C&S, abx deescalation / LOT  Height: 5' 9.02" (175.3 cm) Weight: 77.2 kg (170 lb 3.1 oz) IBW/kg (Calculated) : 70.74  Temp (24hrs), Avg:99.2 F (37.3 C), Min:98.2 F (36.8 C), Max:99.8 F (37.7 C)  Recent Labs  Lab 09/05/21 2310 09/06/21 0421 09/07/21 0859  WBC 13.7* 13.3*  --   CREATININE 1.08 1.03 1.24     Estimated Creatinine Clearance: 53.8 mL/min (by C-G formula based on SCr of 1.24 mg/dL).    No Known Allergies  Antimicrobials this admission: 10/9 Cefepime >>  10/9 Vancomycin >>   Microbiology results: 10/8 Synovial Fluid Cx (Knee): staph aureus (susp pending)  Thank you for allowing pharmacy to be a part of this patient's care.  Darnelle Bos, PharmD 09/07/2021 3:40 PM

## 2021-09-07 NOTE — TOC Progression Note (Signed)
Transition of Care [facility]) - Progression Note    Patient Details  Name: David [name] MRN: [id number] Date of Birth: [birth year]  Transition of Care [facility]) CM/SW [place], RN Phone Number: 09/07/2021, 8:54 AM  Clinical Narrative:     Met with the patient to discuss DC plan and needs, reviewed bed offers with the patient and reviewed star ratings, he chose [place], I called and spoke with [facility] and accepted the bed offer, he will need Ins auth started when medically stable  Expected Discharge Plan: [place] Barriers to Discharge: [facility] (PASRR), Insurance Authorization, Continued Medical Work up  Expected Discharge Plan and Services Expected Discharge Plan: [place] Choice: [place] arrangements for the past 2 months: Apartment                                       Social Determinants of Health (SDOH) Interventions    Readmission Risk Interventions No flowsheet data found.

## 2021-09-07 NOTE — Consult Note (Addendum)
NAMEAlijah [name]  DOB: [birth year]  MRN: [id number]  Date/Time: 09/07/2021 10:59 AM  REQUESTING PROVIDER: Dr. Posey Pronto Subjective:  REASON FOR CONSULT: Left knee septic arthritis ? David [name] is a 70 y.o. male with a history of type 2 diabetes, T11-T12 stenosis with myelopathy, for which he underwent laminectomy of T11 and T12 with resection of adherent synovial cyst and microdiscectomy on 06/19/2021., frequent falls, presents with pain and swelling of the right knee.  After having a recent fall. Patient initially presented to the ED on 09/05/2021 after falling and hurting both his knees and hips.  X-rays were done and there was no fracture and he was discharged on pain medication and Keflex.[name] He returned to the ED the next day because his right knee was feeling hot with redness and was more swollen.  He also fell the second time.  As per patient he has fallen 2 to 3 times per week since June. On June 19, 2021 he underwent T11-T12 laminectomy with resection of adherent synovial cyst and microdissection.  Also underwent transpedicular microdiscectomy T11-T12.  His pre-op diagnosis was T11-T12 stenosis with myelopathy and a right T11-T12 herniated nucleus pulposus. Patient denies any fever In the ED vitals BP 108/55, temp 99.8, respiratory rate 16, pulse rate 78, sats 100%. WBC 30.3, Hb 8.4, platelet 110 and creatinine 1.03.  Left knee was swollen and it was aspirated.  The white count was 145,568 neutrophils 93% He was started on vancomycin and cefepime. He was seen by Ortho.  I am asked to see the patient for the septic arthritis And now the culture is positive for staph aureus and he will be taken for washout.  Mild  Past Medical History:  Diagnosis Date   Allergy    Anemia    Arthritis    Bilateral hands and feet   CAD (coronary artery disease)    s/p bifurcation stenting (DES x2) and stenting bifurcation lesion distal RCA (1 DES)   Cataract    Cirrhosis (HCC)    Depression    Diabetes  mellitus    Esophageal varices (HCC)    GERD (gastroesophageal reflux disease)    Hyperlipemia    Hypertension    Myocardial infarction ([place])    Sleep apnea    CPAP    Past Surgical History:  Procedure Laterality Date   BIOPSY  09/01/2018   Procedure: BIOPSY;  Surgeon: Mauri Pole, MD;  Location: [facility];  Service: Endoscopy;;   CATARACT EXTRACTION Bilateral    CORONARY ANGIOPLASTY WITH STENT PLACEMENT   March 2010   Dr. Olevia Perches   ESOPHAGOGASTRODUODENOSCOPY (EGD) WITH PROPOFOL N/A 09/01/2018   Procedure: ESOPHAGOGASTRODUODENOSCOPY (EGD) WITH PROPOFOL;  Surgeon: Mauri Pole, MD;  Location: [facility];  Service: Endoscopy;  Laterality: N/A;   LEFT HEART CATH AND CORONARY ANGIOGRAPHY N/A 08/31/2018   Procedure: LEFT HEART CATH AND CORONARY ANGIOGRAPHY;  Surgeon: Sherren Mocha, MD;  Location: [facility];  Service: Cardiovascular;  Laterality: N/A;   THORACIC DISCECTOMY Right 06/19/2021   Procedure: Microdiscectomy - right - Thoracic Eleven-Thoracic Twelve;  Surgeon: Earnie Larsson, MD;  Location: [facility];  Service: Neurosurgery;  Laterality: Right;   TONSILLECTOMY  1959    Social History   Socioeconomic History   Marital status: Divorced    Spouse name: Not on file   Number of children: 2   Years of education: Not on file   Highest education level: Not on file  Occupational History   Occupation: Patent examiner buildings  Comment: site closed   Occupation: Engineering geologist    Comment: part time    Comment: Part time work  Tobacco Use   Smoking status: Former    Types: Cigarettes    Quit date: 11/30/1983    Years since quitting: 37.7   Smokeless tobacco: Never  Vaping Use   Vaping Use: Never used  Substance and Sexual Activity   Alcohol use: Yes    Alcohol/week: 20.0 standard drinks    Types: 20 Cans of beer per week    Comment: per pt 10-15 beers/week (as of 06/18/21)   Drug use: No   Sexual activity: Not Currently  Other Topics Concern    Not on file  Social History Narrative   No living will   Would want want son David [name], as health care POA   Would accept resuscitation   Not sure about tube feeds--but doesn't want if cognitively unaware   Social Determinants of Health   Financial Resource Strain: Not on file  Food Insecurity: Not on file  Transportation Needs: Not on file  Physical Activity: Not on file  Stress: Not on file  Social Connections: Not on file  Intimate Partner Violence: Not on file    Family History  Problem Relation Age of Onset   Heart failure Father        Deceased 106 y/o   Diabetes Father    Cancer Father        Prostate   Heart failure Mother        Deceased 103 y/o   Alcohol abuse Mother    Schizophrenia Son        Paranoid   Uveitis Son    Cancer Brother        prostate   Colon cancer Neg Hx    Esophageal cancer Neg Hx    Stomach cancer Neg Hx    Liver cancer Neg Hx    Pancreatic cancer Neg Hx    Rectal cancer Neg Hx    No Known Allergies I? Current Facility-Administered Medications  Medication Dose Route Frequency Provider Last Rate Last Admin   ascorbic acid (VITAMIN C) tablet 1,000 mg  1,000 mg Oral Daily Fritzi Mandes, MD   1,000 mg at 09/07/21 0958   ceFEPIme (MAXIPIME) 2 g in sodium chloride 0.9 % 100 mL IVPB  2 g Intravenous Q12H Darnelle Bos, RPH       colchicine tablet 0.6 mg  0.6 mg Oral BID Tu, Ching T, DO   0.6 mg at 09/07/21 3151   ferrous sulfate tablet 325 mg  325 mg Oral Q breakfast Fritzi Mandes, MD   325 mg at 09/07/21 7616   furosemide (LASIX) tablet 20 mg  20 mg Oral Daily Fritzi Mandes, MD   20 mg at 09/07/21 0958   insulin aspart (novoLOG) injection 0-15 Units  0-15 Units Subcutaneous TID WC Tu, Ching T, DO   2 Units at 09/06/21 1210   LORazepam (ATIVAN) tablet 1-4 mg  1-4 mg Oral Q1H PRN Tu, Ching T, DO       Or   LORazepam (ATIVAN) injection 1-4 mg  1-4 mg Intravenous Q1H PRN Tu, Ching T, DO       losartan (COZAAR) tablet 50 mg  50 mg Oral Daily Fritzi Mandes, MD    50 mg at 09/07/21 0958   morphine 2 MG/ML injection 1 mg  1 mg Intravenous Q4H PRN Tu, Ching T, DO   1 mg at 09/06/21 0230  multivitamin with minerals tablet 1 tablet  1 tablet Oral QHS Fritzi Mandes, MD   1 tablet at 09/06/21 2100   ondansetron [facility]) injection 4 mg  4 mg Intravenous Q6H PRN Fritzi Mandes, MD   4 mg at 09/07/21 0755   oxyCODONE-acetaminophen (PERCOCET/ROXICET) 5-325 MG per tablet 1 tablet  1 tablet Oral Q4H PRN Tu, Ching T, DO   1 tablet at 09/07/21 1006   pantoprazole (PROTONIX) EC tablet 40 mg  40 mg Oral BID Fritzi Mandes, MD   40 mg at 09/07/21 0958   PARoxetine (PAXIL) tablet 40 mg  40 mg Oral Chong Sicilian, Gus Height, MD   40 mg at 09/07/21 4097   polyvinyl alcohol (LIQUIFILM TEARS) 1.4 % ophthalmic solution 2 drop  2 drop Both Eyes Daily Fritzi Mandes, MD       propranolol (INDERAL) tablet 80 mg  80 mg Oral BID Fritzi Mandes, MD   80 mg at 09/07/21 1006   rosuvastatin (CRESTOR) tablet 10 mg  10 mg Oral Daily Fritzi Mandes, MD   10 mg at 09/07/21 3532   spironolactone (ALDACTONE) tablet 50 mg  50 mg Oral Daily Fritzi Mandes, MD   50 mg at 09/07/21 9924   vancomycin (VANCOREADY) IVPB 1500 mg/300 mL  1,500 mg Intravenous Q24H Renda Rolls, RPH   Stopped at 09/07/21 1056     Abtx:  Anti-infectives (From admission, onward)    Start     Dose/Rate Route Frequency Ordered Stop   09/07/21 2211  ceFEPIme (MAXIPIME) 2 g in sodium chloride 0.9 % 100 mL IVPB        2 g 200 mL/hr over 30 Minutes Intravenous Every 12 hours 09/07/21 1059     09/07/21 0300  vancomycin (VANCOREADY) IVPB 1500 mg/300 mL        1,500 mg 150 mL/hr over 120 Minutes Intravenous Every 24 hours 09/06/21 0452     09/06/21 0200  vancomycin (VANCOREADY) IVPB 1750 mg/350 mL        1,750 mg 175 mL/hr over 120 Minutes Intravenous  Once 09/06/21 0102 09/06/21 0645   09/06/21 0115  ceFEPIme (MAXIPIME) 2 g in sodium chloride 0.9 % 100 mL IVPB  Status:  Discontinued        2 g 200 mL/hr over 30 Minutes Intravenous Every 8 hours  09/06/21 0100 09/07/21 1059   09/06/21 0000  vancomycin (VANCOCIN) IVPB 1000 mg/200 mL premix  Status:  Discontinued        1,000 mg 200 mL/hr over 60 Minutes Intravenous  Once 09/05/21 2353 09/06/21 0100   09/06/21 0000  ceFEPIme (MAXIPIME) 2 g in sodium chloride 0.9 % 100 mL IVPB  Status:  Discontinued        2 g 200 mL/hr over 30 Minutes Intravenous  Once 09/05/21 2353 09/06/21 0100       REVIEW OF SYSTEMS:  Const: negative fever, negative chills, negative weight loss Eyes: negative diplopia or visual changes, negative eye pain ENT: negative coryza, negative sore throat Resp: negative cough, hemoptysis, dyspnea Cards: negative for chest pain, palpitations, lower extremity edema GU: negative for frequency, dysuria and hematuria GI: Negative for abdominal pain, diarrhea, bleeding, constipation Skin: Mild full scabs and wounds in the legs heme: negative for easy bruising and gum/nose bleeding MS: Weakness legs Neurolo: Neuropathy legs Psych: negative for feelings of anxiety, depression  Endocrine: , diabetes Allergy/Immunology-no known drug allergy ?  Objective:  VITALS:  BP 128/62 (BP Location: Right Arm)   Pulse 82   Temp 98.2  F (36.8 C) (Oral)   Resp 20   Ht 5' 9.02" (1.753 m)   Wt 77.2 kg   SpO2 100%   BMI 25.12 kg/m  PHYSICAL EXAM:  General: Alert, cooperative, no distress, at rest appears stated age.  Head: Normocephalic, without obvious abnormality, atraumatic. Eyes: Conjunctivae clear, anicteric sclerae. Pupils are equal ENT Nares normal. No drainage or sinus tenderness. Lips, mucosa, and tongue normal. No Thrush Neck: Supple, symmetrical, no adenopathy, thyroid: non tender no carotid bruit and no JVD. Back: Scar over the thoracolumbar area has healed very well /lungs: Clear to auscultation bilaterally. No Wheezing or Rhonchi. No rales. Heart: Regular rate and rhythm, no murmur, rub or gallop. Abdomen: Soft, non-tender,not distended. Bowel sounds normal. No  masses Extremities: Wounds on both knees with scabs On the toes he is got multiple scabs Left knee is swollen, erythematous and warm Skin: As above Lymph: Cervical, supraclavicular normal. Neurologic: Did not examine in detail pertinent Labs Lab Results CBC    Component Value Date/Time   WBC 13.3 (H) 09/06/2021 0421   RBC 2.39 (L) 09/06/2021 0421   HGB 8.4 (L) 09/06/2021 0421   HCT 24.8 (L) 09/06/2021 0421   PLT 110 (L) 09/06/2021 0421   MCV 103.8 (H) 09/06/2021 0421   MCH 35.1 (H) 09/06/2021 0421   MCHC 33.9 09/06/2021 0421   RDW 15.5 09/06/2021 0421   LYMPHSABS 0.9 09/05/2021 2310   MONOABS 2.1 (H) 09/05/2021 2310   EOSABS 0.0 09/05/2021 2310   BASOSABS 0.0 09/05/2021 2310    CMP Latest Ref Rng & Units 09/07/2021 09/06/2021 09/05/2021  Glucose 70 - 99 mg/dL - 179(H) 223(H)  BUN 8 - 23 mg/dL - 22 22  Creatinine 0.61 - 1.24 mg/dL 1.24 1.03 1.08  Sodium 135 - 145 mmol/L - 133(L) 131(L)  Potassium 3.5 - 5.1 mmol/L - 4.8 5.2(H)  [place] 98 - 111 mmol/L - 102 100  CO2 22 - 32 mmol/L - 25 24  Calcium 8.9 - 10.3 mg/dL - 8.5(L) 8.6(L)  Total Protein 6.0 - 8.3 g/dL - - -  Total Bilirubin 0.2 - 1.2 mg/dL - - -  Alkaline Phos 39 - 117 U/L - - -  AST 0 - 37 U/L - - -  ALT 0 - 53 U/L - - -      Microbiology: Recent Results (from the past 240 hour(s))  Body fluid culture w Gram Stain     Status: None (Preliminary result)   Collection Time: 09/05/21 10:16 PM   Specimen: KNEE; Body Fluid  Result Value Ref Range Status   Specimen Description   Final    KNEE Performed at [facility], [address] [place], [place] [address]    Special Requests   Final    NONE Performed at [facility], [address] [place], [place] [address]    Gram Stain   Final    NO SQUAMOUS EPITHELIAL CELLS SEEN MODERATE WBC PRESENT, PREDOMINANTLY PMN NO ORGANISMS SEEN    Culture   Final    CULTURE REINCUBATED FOR BETTER GROWTH Performed at [facility], [place]  [address], [place], [place] [address]    Report Status PENDING  Incomplete  Resp Panel by RT-PCR (Flu A&B, Covid) Nasopharyngeal Swab     Status: None   Collection Time: 09/06/21  2:27 AM   Specimen: Nasopharyngeal Swab; Nasopharyngeal(NP) swabs in vial transport medium  Result Value Ref Range Status   SARS Coronavirus 2 by RT PCR NEGATIVE NEGATIVE Final  Comment: (NOTE) SARS-CoV-2 target nucleic acids are NOT DETECTED.  The SARS-CoV-2 RNA is generally detectable in upper respiratory specimens during the acute phase of infection. The lowest concentration of SARS-CoV-2 viral copies this assay can detect is 138 copies/mL. A negative result does not preclude SARS-Cov-2 infection and should not be used as the sole basis for treatment or other patient management decisions. A negative result may occur with  improper specimen collection/handling, submission of specimen other than nasopharyngeal swab, presence of viral mutation(s) within the areas targeted by this assay, and inadequate number of viral copies(<138 copies/mL). A negative result must be combined with clinical observations, patient history, and epidemiological information. The expected result is Negative.  Fact Sheet for Patients:  EntrepreneurPulse.com.au  Fact Sheet for Healthcare Providers:  IncredibleEmployment.be  This test is no t yet approved or cleared by the [place] FDA and  has been authorized for detection and/or diagnosis of SARS-CoV-2 by FDA under an Emergency Use Authorization (EUA). This EUA will remain  in effect (meaning this test can be used) for the duration of the COVID-19 declaration under Section 564(b)(1) of the Act, 21 U.S.C.section 360bbb-3(b)(1), unless the authorization is terminated  or revoked sooner.       Influenza A by PCR NEGATIVE NEGATIVE Final   Influenza B by PCR NEGATIVE NEGATIVE Final    Comment: (NOTE) The Xpert Xpress SARS-CoV-2/FLU/RSV plus  assay is intended as an aid in the diagnosis of influenza from Nasopharyngeal swab specimens and should not be used as a sole basis for treatment. Nasal washings and aspirates are unacceptable for Xpert Xpress SARS-CoV-2/FLU/RSV testing.  Fact Sheet for Patients: EntrepreneurPulse.com.au  Fact Sheet for Healthcare Providers: IncredibleEmployment.be  This test is not yet approved or cleared by the [place] FDA and has been authorized for detection and/or diagnosis of SARS-CoV-2 by FDA under an Emergency Use Authorization (EUA). This EUA will remain in effect (meaning this test can be used) for the duration of the COVID-19 declaration under Section 564(b)(1) of the Act, 21 U.S.C. section 360bbb-3(b)(1), unless the authorization is terminated or revoked.  Performed at [facility], [address] [place], [place] [address]     IMAGING RESULTS: X-ray of both knees no fracture X-ray of right elbow no fracture I have personally reviewed the films ? Impression/Recommendation Patient presented on 09/05/2021 with multiple falls and wounds on both knees and toes  Left knee septic arthritis due to staff aureus Susceptibility pending Currently on Vanco and cefepime We will discontinue cefepime. If MSSA we can switch Vanco to cefazolin. Patient is going for arthroscopic washout today Concurrent CPPD crystalopathy/pseudogout of left knee  History of multiple falls  T11/T12 stenosis with myelopathy.  Status post laminectomy and microdiscectomy   Neuropathy with poor balance Would recommend neuro evaluation  Diabetes mellitus on sliding scale  CAD status post multiple stents. On rosuvastatin, losartan. ? Thrombocytopenia likely due to liver cirrhosis  Anemia macrocytic in nature  History of cirrhosis with esophageal varices.  Thought to be due to past history of alcohol  use. ___________________________________________________ Discussed with patient and  requesting provider Dr.Fitzgerald will follow him from tomorrow Note:  This document was prepared using Dragon voice recognition software and may include unintentional dictation errors.

## 2021-09-07 NOTE — Transfer of Care (Signed)
Immediate Anesthesia Transfer of Care Note  Patient: David [name]  Procedure(s) Performed: IRRIGATION AND DEBRIDEMENT OF LEFT KNEE; ARTHROSCOPY OF LEFT KNEE, PARTIAL MEDIAL MINISECTOMY. (Left: Knee)  Patient Location: PACU  Anesthesia Type:General  Level of Consciousness: drowsy  Airway & Oxygen Therapy: Patient Spontanous Breathing and Patient connected to face mask oxygen  Post-op Assessment: Report given to RN and Post -op Vital signs reviewed and stable  Post vital signs: Reviewed and stable  Last Vitals:  Vitals Value Taken Time  BP 102/59 09/07/21 1845  Temp    Pulse 65 09/07/21 1848  Resp 21 09/07/21 1848  SpO2 100 % 09/07/21 1848  Vitals shown include unvalidated device data.  Last Pain:  Vitals:   09/07/21 1550  TempSrc: Tympanic  PainSc:       Patients Stated Pain Goal: 0 (58/48/35 0757)  Complications: No notable events documented.

## 2021-09-07 NOTE — H&P (Signed)
THE PATIENT WAS SEEN PRIOR TO SURGERY TODAY.  HISTORY, ALLERGIES, HOME MEDICATIONS AND OPERATIVE PROCEDURE WERE REVIEWED. RISKS AND BENEFITS OF SURGERY DISCUSSED WITH PATIENT AGAIN.  NO CHANGES FROM INITIAL HISTORY AND PHYSICAL NOTED.    

## 2021-09-07 NOTE — Anesthesia Procedure Notes (Signed)
Procedure Name: Intubation Date/Time: 09/07/2021 5:46 PM Performed by: Nelda Marseille, CRNA Pre-anesthesia Checklist: Patient identified, Patient being monitored, Timeout performed, Emergency Drugs available and Suction available Patient Re-evaluated:Patient Re-evaluated prior to induction Oxygen Delivery Method: Circle system utilized Preoxygenation: Pre-oxygenation with 100% oxygen Induction Type: IV induction Ventilation: Mask ventilation without difficulty Laryngoscope Size: Mac, 3 and McGraph Grade View: Grade I Tube type: Oral Tube size: 7.0 mm Number of attempts: 1 Airway Equipment and Method: Stylet Placement Confirmation: ETT inserted through vocal cords under direct vision, positive ETCO2 and breath sounds checked- equal and bilateral Secured at: 21 cm Tube secured with: Tape Dental Injury: Teeth and Oropharynx as per pre-operative assessment

## 2021-09-07 NOTE — Op Note (Signed)
09/05/2021 - 09/07/2021  6:42 PM  PATIENT:  David [name]    PRE-OPERATIVE DIAGNOSIS:  Left Knee Septic  POST-OPERATIVE DIAGNOSIS:  Same plus medial meniscus tear  PROCEDURE:  ARTHROSCOPY KNEE with irrigation and debridement for sepsis 2)  arthroscopic partial medial meniscectomy  SURGEON:  Park Breed, MD  COMPLICATIONS:   None  EBL: Minimal  TOURNIQUET TIME:   None     .  ANESTHESIA: General LMA  PREOPERATIVE INDICATIONS:  [name] is a  70 y.o. male with a diagnosis of Left Knee Septic who failed conservative measures and elected for surgical management.    The risks benefits and alternatives were discussed with the patient preoperatively including but not limited to the risks of infection, bleeding, nerve injury, cardiopulmonary complications, the need for revision surgery, among others, and the patient was willing to proceed.  OPERATIVE IMPLANTS: None  OPERATIVE FINDINGS: The knee was full of creamy yellow fluid which was slightly thickened.  He had a tear of the posterior portion of the medial meniscus.  There was significant amount of pyrophosphate crystals in the menisci.  The anterior cruciate and lateral meniscus were intact.  There was mild to moderate synovitis.  The patellofemoral articulation was normal.  There were no loose bodies.  The suprapatellar pouch was normal.  OPERATIVE PROCEDURE: The patient was brought to the operating room and underwent satisfactory general LMA anesthesia in the supine position.  The leg was prepped and draped in a sterile fashion.  Arthroscopy was carried out through standard portals.  The above findings were encountered upon arthroscopy.  Fluid was expressed from the joint and cultured.  The medial incision was made and for separate portal and a probe was inserted.  The survey of the joint was carried out with the above findings noted.  Joint was then thoroughly irrigated with 6 L of lactated Ringer's solution.  Once this was  completed a medium Hemovac drain was placed in the joint.    the knee was infiltrated with 20 cc of half percent Marcaine with epinephrine.  Once this was completed and stabilized the joint was thoroughly irrigated.  Instruments were removed and knee wounds closed with 3-0 nylon.  Sponge and needle counts were correct. A dry sterile dressing was applied.  Patient was awakened and taken to recovery in good condition. The Hemovac was left clamped until the patient reached recovery room.     Park Breed, MD

## 2021-09-07 NOTE — Plan of Care (Signed)
  Problem: Education: Goal: Knowledge of General Education information will improve Description: Including pain rating scale, medication(s)/side effects and non-pharmacologic comfort measures Outcome: Progressing   Problem: Health Behavior/Discharge Planning: Goal: Ability to manage health-related needs will improve Outcome: Progressing   Problem: Clinical Measurements: Goal: Ability to maintain clinical measurements within normal limits will improve Outcome: Progressing Goal: Respiratory complications will improve Outcome: Progressing   Problem: Nutrition: Goal: Adequate nutrition will be maintained Outcome: Progressing   Problem: Coping: Goal: Level of anxiety will decrease Outcome: Progressing   Problem: Elimination: Goal: Will not experience complications related to bowel motility Outcome: Progressing

## 2021-09-07 NOTE — Progress Notes (Signed)
Patient arrived to pacu, confused, alert to name only at present. Slightly agitated, "picking" at equipment, need re-oriented freq.  Hemovac in place LLE, per Dr. Sabra Heck leave unclampled first 34mins  and then unclamp. Hemovac at present unclamped and to suction.

## 2021-09-07 NOTE — Progress Notes (Signed)
PT Cancellation Note  Patient Details Name: David [name] MRN: [id number] DOB: Oct 03, 1951   Cancelled Treatment:    Reason Eval/Treat Not Completed: Patient at procedure or test/unavailable.  Chart reviewed.  Pt currently off floor (per MD Sabra Heck note, plan for arthroscopic surgery for L knee this afternoon).  Will re-attempt PT treatment session at a later date/time as appropriate.  Leitha Bleak, PT 09/07/21, 4:25 PM

## 2021-09-07 NOTE — Progress Notes (Signed)
Patient's blood sugar 34 this morning. This nurse gave patient Orange juice with sugar and graham crackers. Blood sugar rechecked at 0747 and was 58. Patient given more orange juice and breakfast tray given to patient. Rechecked blood sugar at 0801 and was 100. Dr. Posey Pronto notified. No new orders at this time.

## 2021-09-07 NOTE — Progress Notes (Signed)
[facility] [place] NAME: David [name]    MR#:  [id number]  DATE OF BIRTH:  [birth year]  SUBJECTIVE:  patient came in after repeated falls at home. He was seen in the emergency room two days ago but prescribed antibiotics to go home with. Complains of significant left knee pain unable to bend the knee. Fluid was aspirated which appears inflamed/infected. No fever.\ Lives with a roommate uses walker to walk at home. Has significant neuropathy due to diabetes leading to multiple falls  patient continues to have significant pain in the left knee. Unable to bend. Quite swollen. Not read. Very sensitive to touch. No family at bedside  REVIEW OF SYSTEMS:   Review of Systems  Constitutional:  Negative for chills, fever and weight loss.  HENT:  Negative for ear discharge, ear pain and nosebleeds.   Eyes:  Negative for blurred vision, pain and discharge.  Respiratory:  Negative for sputum production, shortness of breath, wheezing and stridor.   Cardiovascular:  Negative for chest pain, palpitations, orthopnea and PND.  Gastrointestinal:  Negative for abdominal pain, diarrhea, nausea and vomiting.  Genitourinary:  Negative for frequency and urgency.  Musculoskeletal:  Positive for joint pain. Negative for back pain.  Neurological:  Positive for weakness. Negative for sensory change, speech change and focal weakness.  Psychiatric/Behavioral:  Negative for depression and hallucinations. The patient is not nervous/anxious.   Tolerating Diet:yes Tolerating PT: rehab  DRUG ALLERGIES:  No Known Allergies  VITALS:  Blood pressure 128/62, pulse 82, temperature 98.2 F (36.8 C), temperature source Oral, resp. rate 20, height 5' 9.02" (1.753 m), weight 77.2 kg, SpO2 100 %.  PHYSICAL EXAMINATION:   Physical Exam  GENERAL:  70 y.o.-year-old patient lying in the bed with no acute distress.  LUNGS: Normal breath sounds bilaterally, no wheezing, rales,  rhonchi. No use of accessory muscles of respiration.  CARDIOVASCULAR: S1, S2 normal. No murmurs, rubs, or gallops.  ABDOMEN: Soft, nontender, nondistended. Bowel sounds present. No organomegaly or mass.  EXTREMITIES:   Decreased ROM left knee NEUROLOGIC: ++ sensory deficits b/l.   PSYCHIATRIC:  patient is alert and oriented x 3.  SKIN: as above  LABORATORY PANEL:  CBC Recent Labs  Lab 09/06/21 0421  WBC 13.3*  HGB 8.4*  HCT 24.8*  PLT 110*     Chemistries  Recent Labs  Lab 09/06/21 0421 09/07/21 0859  NA 133*  --   K 4.8  --   CL 102  --   CO2 25  --   GLUCOSE 179*  --   BUN 22  --   CREATININE 1.03 1.24  CALCIUM 8.5*  --     Cardiac Enzymes No results for input(s): TROPONINI in the last 168 hours. RADIOLOGY:  No results found. ASSESSMENT AND PLAN:  David [name] is a 70 y.o. male with medical history significant for alcoholic cirrhosis, insulin-dependent type 2 diabetes, hypertension, CAD, hyperlipidemia and depression who presents with concerns of worsening knee pain.    left knee septic arthritis, staph aureus - Synovial fluid drawn showed pseudogout and greater than 140,000 WBC.  --Knee fluid Cultures Positive for Staph aureus --Orthopedic Dr. Cletus Gash for Arthrocentesis and wash out this afternoon -Continue IV vancomycin and cefepime --ID consult for abx help   Pseudogout -Synovial fluid from left knee aspiration shows calcium pyrophosphate crystal - Start daily colchicine   Right knee cellulitis - Continue on IV vancomycin and cefepime - Wound care   Recurrent falls -suspect  more due to neuropathy and LE weakness -PT eval   Mild hyperkalemia - Likely just transient.  Follow with repeat labs in the morning   Insulin-dependent type 2 diabetes with hyperglycemia, peripheral neuropathy -Last hemoglobin A1c of 6 on 9/23 - Home regimen includes Novolin N 30 units in the AM and 40 units PM. Mealtime sliding scale Humalog.  - Start with 25 units  Levermir BID with moderate SSI    Alcoholic cirrhosis chronic thrombocytopenia - No liver decompensation on admission - Reports current alcohol use of 6 pack of beer at least twice weekly -place on CIWA Protocol   HTN  -Resume antihypertensives once medications are verified     DVT prophylaxis: SCDs Code Status: Full Family Communication: Plan discussed with patient at bedside  disposition Plan: Rehab in 2-3 days Consults called:  Admission status: inpatient   Level of care: Med-Surg   Status is: Inpatient   Remains inpatient appropriate because:Inpatient level of care appropriate due to severity of illness   Dispo: The patient is from: Home              Anticipated d/c is to: TBD              Patient currently is not medically stable to d/c.              Difficult to place patient No    TOC for d/c planning           TOTAL TIME TAKING CARE OF THIS PATIENT: 35 minutes.  >50% time spent on counselling and coordination of care  Note: This dictation was prepared with Dragon dictation along with smaller phrase technology. Any transcriptional errors that result from this process are unintentional.  David [name] M.D    [facility]   CC: Primary care physician; [name], MD Patient ID: David [name], male   DOB: 06/21/1951, 70 y.o.   MRN: [id number]

## 2021-09-07 NOTE — Progress Notes (Signed)
Multiple bruises bil upper ext and lower ext Left foot digits "bruised"  denies pain. Pulses +1.

## 2021-09-07 NOTE — Progress Notes (Addendum)
Lab called to report Knee fluid aspirate is growing Staph Aureus. Sensitivities will be finalized by tomorrow. Hospitalist, ID, and Ortho made aware.

## 2021-09-08 ENCOUNTER — Encounter: Payer: Self-pay | Admitting: Specialist

## 2021-09-08 ENCOUNTER — Inpatient Hospital Stay: Payer: Self-pay

## 2021-09-08 LAB — GLUCOSE, CAPILLARY
Glucose-Capillary: 303 mg/dL — ABNORMAL HIGH (ref 70–99)
Glucose-Capillary: 351 mg/dL — ABNORMAL HIGH (ref 70–99)
Glucose-Capillary: 282 mg/dL — ABNORMAL HIGH (ref 70–99)
Glucose-Capillary: 219 mg/dL — ABNORMAL HIGH (ref 70–99)
Glucose-Capillary: 229 mg/dL — ABNORMAL HIGH (ref 70–99)

## 2021-09-08 LAB — C-REACTIVE PROTEIN: CRP: 24.7 mg/dL — ABNORMAL HIGH (ref <1.0)

## 2021-09-08 LAB — BODY FLUID CULTURE W GRAM STAIN: Gram Stain: NONE SEEN

## 2021-09-08 LAB — CREATININE, SERUM
Creatinine, Ser: 1.41 mg/dL — ABNORMAL HIGH (ref 0.61–1.24)
GFR, Estimated: 53 mL/min — ABNORMAL LOW (ref >60)

## 2021-09-08 LAB — CBC
HCT: 25.3 % — ABNORMAL LOW (ref 39.0–52.0)
Hemoglobin: 8.6 g/dL — ABNORMAL LOW (ref 13.0–17.0)
MCH: 35.1 pg — ABNORMAL HIGH (ref 26.0–34.0)
MCHC: 34 g/dL (ref 30.0–36.0)
MCV: 103.3 fL — ABNORMAL HIGH (ref 80.0–100.0)
Platelets: 124 10*3/uL — ABNORMAL LOW (ref 150–400)
RBC: 2.45 MIL/uL — ABNORMAL LOW (ref 4.22–5.81)
RDW: 15.4 % (ref 11.5–15.5)
WBC: 7.9 10*3/uL (ref 4.0–10.5)
nRBC: 0 % (ref 0.0–0.2)

## 2021-09-08 MED ORDER — SODIUM CHLORIDE 0.9% FLUSH: 10.0000 mL | INTRAVENOUS | Status: DC | PRN

## 2021-09-08 MED ORDER — SODIUM CHLORIDE 0.9% FLUSH
10.0000 mL | Freq: Two times a day (BID) | INTRAVENOUS | Status: DC
Start: 2021-09-08 — End: 2021-09-10
  Administered 2021-09-08 – 2021-09-10 (×4): 10 mL

## 2021-09-08 MED ORDER — CEFAZOLIN SODIUM-DEXTROSE 2-4 GM/100ML-% IV SOLN
2.0000 g | Freq: Three times a day (TID) | INTRAVENOUS | Status: DC
  Administered 2021-09-08 – 2021-09-10 (×6): 2 g via INTRAVENOUS
  Filled 2021-09-08 (×7): qty 100

## 2021-09-08 MED ORDER — CHLORHEXIDINE GLUCONATE CLOTH 2 % EX PADS
6.0000 | MEDICATED_PAD | Freq: Every day | CUTANEOUS | Status: DC
  Administered 2021-09-08 – 2021-09-10 (×3): 6 via TOPICAL

## 2021-09-08 MED ORDER — INSULIN GLARGINE-YFGN 100 UNIT/ML ~~LOC~~ SOLN
15.0000 [IU] | Freq: Two times a day (BID) | SUBCUTANEOUS | Status: DC
  Administered 2021-09-08 – 2021-09-09 (×4): 15 [IU] via SUBCUTANEOUS
  Filled 2021-09-08 (×6): qty 0.15

## 2021-09-08 NOTE — Progress Notes (Signed)
PT Cancellation Note  Patient Details Name: David [name] MRN: [id number] DOB: November 02, 1951   Cancelled Treatment:    Reason Eval/Treat Not Completed: Other (comment).  New PT consult received.  Chart reviewed.  Pt resting in bed upon PT arrival.  Pt refusing PT session d/t not feeling like it and wanting to sleep instead.  Will re-attempt PT re-evaluation at a later date/time as able.  Leitha Bleak, PT 09/08/21, 11:44 AM

## 2021-09-08 NOTE — TOC Progression Note (Signed)
Transition of Care [facility]) - Progression Note    Patient Details  Name: David [name] MRN: [id number] Date of Birth: 10-15-1951  Transition of Care [facility]) CM/SW [place], RN Phone Number: 09/08/2021, 2:52 PM  Clinical Narrative:    Uploaded the clinical documents to [facility] to obtained insurance approval, ref number [id number]   Expected Discharge Plan: [place] Barriers to Discharge: [place] (PASRR), Insurance Authorization, Continued Medical Work up  Expected Discharge Plan and Services Expected Discharge Plan: [place] Choice: [place] arrangements for the past 2 months: Apartment                                       Social Determinants of Health (SDOH) Interventions    Readmission Risk Interventions No flowsheet data found.

## 2021-09-08 NOTE — Consult Note (Signed)
[facility] INFECTIOUS DISEASE PROGRESS NOTE Date of Admission:  09/05/2021     ID: David [name] is a 70 y.o. male with  staph aureus septic knee  Active Problems:   Essential hypertension   Knee pain   Pseudogout of knee, left   Cellulitis   Alcoholic cirrhosis of liver without ascites (HCC)   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)   Staphylococcal arthritis of left knee (HCC)   Subjective: No fevers, cx with MSSA.    ROS  Eleven systems are reviewed and negative except per hpi  Medications:  Antibiotics Given (last 72 hours)     Date/Time Action Medication Dose Rate   09/06/21 0225 New Bag/Given   ceFEPIme (MAXIPIME) 2 g in sodium chloride 0.9 % 100 mL IVPB 2 g 200 mL/hr   09/06/21 0258 New Bag/Given   vancomycin (VANCOREADY) IVPB 1750 mg/350 mL 1,750 mg 175 mL/hr   09/06/21 0910 New Bag/Given   ceFEPIme (MAXIPIME) 2 g in sodium chloride 0.9 % 100 mL IVPB 2 g 200 mL/hr   09/06/21 1637 New Bag/Given   ceFEPIme (MAXIPIME) 2 g in sodium chloride 0.9 % 100 mL IVPB 2 g 200 mL/hr   09/07/21 0200 New Bag/Given   ceFEPIme (MAXIPIME) 2 g in sodium chloride 0.9 % 100 mL IVPB 2 g 200 mL/hr   09/07/21 0255 New Bag/Given   vancomycin (VANCOREADY) IVPB 1500 mg/300 mL 1,500 mg 150 mL/hr   09/07/21 1011 New Bag/Given   ceFEPIme (MAXIPIME) 2 g in sodium chloride 0.9 % 100 mL IVPB 2 g 200 mL/hr   09/08/21 0207 New Bag/Given   vancomycin (VANCOREADY) IVPB 1250 mg/250 mL 1,250 mg 166.7 mL/hr       vitamin C  1,000 mg Oral Daily   colchicine  0.6 mg Oral BID   ferrous sulfate  325 mg Oral Q breakfast   furosemide  20 mg Oral Daily   gabapentin  400 mg Oral On Call to OR   insulin aspart  0-15 Units Subcutaneous TID WC   insulin glargine-yfgn  15 Units Subcutaneous BID   losartan  50 mg Oral Daily   meloxicam  15 mg Oral On Call to OR   multivitamin with minerals  1 tablet Oral QHS   pantoprazole  40 mg Oral BID   PARoxetine  40 mg Oral BH-q7a   polyvinyl alcohol  2  drop Both Eyes Daily   propranolol  80 mg Oral BID   rosuvastatin  10 mg Oral Daily   spironolactone  50 mg Oral Daily    Objective: Vital signs in last 24 hours: Temp:  [96.9 F (36.1 C)-98.6 F (37 C)] 97.2 F (36.2 C) (10/11 1122) Pulse Rate:  [57-73] 59 (10/11 1122) Resp:  [13-18] 15 (10/11 1122) BP: (102-139)/(55-74) 110/70 (10/11 1122) SpO2:  [93 %-100 %] 100 % (10/11 1122) Weight:  [77.2 kg] 77.2 kg (10/10 1550) General: Alert, cooperative, no distress, at rest appears stated age.  Head: Normocephalic, without obvious abnormality, atraumatic. Eyes: Conjunctivae clear, anicteric sclerae. Pupils are equal ENT Nares normal. No drainage or sinus tenderness. Lips, mucosa, and tongue normal. No Thrush Neck: Supple, symmetrical, no adenopathy, thyroid: non tender no carotid bruit and no JVD. Back: Scar over the thoracolumbar area has healed very well /lungs: Clear to auscultation bilaterally. No Wheezing or Rhonchi. No rales. Heart: Regular rate and rhythm, no murmur, rub or gallop. Abdomen: Soft, non-tender,not distended. Bowel sounds normal. No masses Extremities: Wounds on R  knees with scabs  On the toes he is got multiple scabs Left knee is in immobilizer post op Skin: As above Lymph: Cervical, supraclavicular normal. Neurologic: Did not examine in detail   Lab Results Recent Labs    09/05/21 2310 09/06/21 0421 09/07/21 0859 09/08/21 0650 09/08/21 1008  WBC 13.7* 13.3*  --   --  7.9  HGB 9.1* 8.4*  --   --  8.6*  HCT 26.7* 24.8*  --   --  25.3*  NA 131* 133*  --   --   --   K 5.2* 4.8  --   --   --   CL 100 102  --   --   --   CO2 24 25  --   --   --   BUN 22 22  --   --   --   CREATININE 1.08 1.03 1.24 1.41*  --     Microbiology: Results for orders placed or performed during the hospital encounter of 09/05/21  Body fluid culture w Gram Stain     Status: None   Collection Time: 09/05/21 10:16 PM   Specimen: KNEE; Body Fluid  Result Value Ref Range Status    Specimen Description   Final    KNEE Performed at [facility], [address] [place], [place] [address]    Special Requests   Final    NONE Performed at [facility], [address] [place], [place] [address]    Gram Stain   Final    NO SQUAMOUS EPITHELIAL CELLS SEEN MODERATE WBC PRESENT, PREDOMINANTLY PMN NO ORGANISMS SEEN    Culture   Final    RARE STAPHYLOCOCCUS AUREUS CRITICAL RESULT CALLED TO, READ BACK BY AND VERIFIED WITH: RN L.SHIFFLETT AT 1244 ON 09/07/2021 BY T.SAAD. Performed at [facility], [place] [address], [place], [place] [address]    Report Status 09/08/2021 FINAL  Final   Organism ID, Bacteria STAPHYLOCOCCUS AUREUS  Final      Susceptibility   Staphylococcus aureus - MIC*    CIPROFLOXACIN <=0.5 SENSITIVE Sensitive     ERYTHROMYCIN <=0.25 SENSITIVE Sensitive     GENTAMICIN <=0.5 SENSITIVE Sensitive     OXACILLIN <=0.25 SENSITIVE Sensitive     TETRACYCLINE <=1 SENSITIVE Sensitive     VANCOMYCIN 1 SENSITIVE Sensitive     TRIMETH/SULFA <=10 SENSITIVE Sensitive     CLINDAMYCIN <=0.25 SENSITIVE Sensitive     RIFAMPIN <=0.5 SENSITIVE Sensitive     Inducible Clindamycin NEGATIVE Sensitive     * RARE STAPHYLOCOCCUS AUREUS  Resp Panel by RT-PCR (Flu A&B, Covid) Nasopharyngeal Swab     Status: None   Collection Time: 09/06/21  2:27 AM   Specimen: Nasopharyngeal Swab; Nasopharyngeal(NP) swabs in vial transport medium  Result Value Ref Range Status   SARS Coronavirus 2 by RT PCR NEGATIVE NEGATIVE Final    Comment: (NOTE) SARS-CoV-2 target nucleic acids are NOT DETECTED.  The SARS-CoV-2 RNA is generally detectable in upper respiratory specimens during the acute phase of infection. The lowest concentration of SARS-CoV-2 viral copies this assay can detect is 138 copies/mL. A negative result does not preclude SARS-Cov-2 infection and should not be used as the sole basis for treatment or other patient management decisions. A negative result  may occur with  improper specimen collection/handling, submission of specimen other than nasopharyngeal swab, presence of viral mutation(s) within the areas targeted by this assay, and inadequate number of viral copies(<138 copies/mL). A negative result must be combined with clinical observations, patient  history, and epidemiological information. The expected result is Negative.  Fact Sheet for Patients:  EntrepreneurPulse.com.au  Fact Sheet for Healthcare Providers:  IncredibleEmployment.be  This test is no t yet approved or cleared by the [place] FDA and  has been authorized for detection and/or diagnosis of SARS-CoV-2 by FDA under an Emergency Use Authorization (EUA). This EUA will remain  in effect (meaning this test can be used) for the duration of the COVID-19 declaration under Section 564(b)(1) of the Act, 21 U.S.C.section 360bbb-3(b)(1), unless the authorization is terminated  or revoked sooner.       Influenza A by PCR NEGATIVE NEGATIVE Final   Influenza B by PCR NEGATIVE NEGATIVE Final    Comment: (NOTE) The Xpert Xpress SARS-CoV-2/FLU/RSV plus assay is intended as an aid in the diagnosis of influenza from Nasopharyngeal swab specimens and should not be used as a sole basis for treatment. Nasal washings and aspirates are unacceptable for Xpert Xpress SARS-CoV-2/FLU/RSV testing.  Fact Sheet for Patients: EntrepreneurPulse.com.au  Fact Sheet for Healthcare Providers: IncredibleEmployment.be  This test is not yet approved or cleared by the [place] FDA and has been authorized for detection and/or diagnosis of SARS-CoV-2 by FDA under an Emergency Use Authorization (EUA). This EUA will remain in effect (meaning this test can be used) for the duration of the COVID-19 declaration under Section 564(b)(1) of the Act, 21 U.S.C. section 360bbb-3(b)(1), unless the authorization is terminated  or revoked.  Performed at [facility], [address], [place],  [address]     Studies/Results: No results found.  Assessment/Plan: 70 y.o. male with a history of type 2 diabetes, T11-T12 stenosis with myelopathy, for which he underwent laminectomy of T11 and T12 with resection of adherent synovial cyst and microdiscectomy on 06/19/2021., frequent falls, presents with pain and swelling of the right knee after having a recent fall.  Found to have MSSA septic knee. Underwent wash out 10/10. Presumed direct infection of knee from overlying wounds.   REC Will plan on 2 weeks IV cefazolin with otpt FU. Place PICC.  Ordered Check CRP - ordered See OPAT note Thank you very much for the consult. Will follow with you.  [name]   09/08/2021, 1:59 PM

## 2021-09-08 NOTE — Progress Notes (Signed)
Peripherally Inserted Central Catheter Placement  The IV Nurse has discussed with the patient and/or persons authorized to consent for the patient, the purpose of this procedure and the potential benefits and risks involved with this procedure.  The benefits include less needle sticks, lab draws from the catheter, and the patient may be discharged home with the catheter. Risks include, but not limited to, infection, bleeding, blood clot (thrombus formation), and puncture of an artery; nerve damage and irregular heartbeat and possibility to perform a PICC exchange if needed/ordered by physician.  Alternatives to this procedure were also discussed.  Bard Power PICC patient education guide, fact sheet on infection prevention and patient information card has been provided to patient /or left at bedside.    PICC Placement Documentation  PICC Single Lumen 09/08/21 Right Brachial 39 cm 0 cm (Active)  Indication for Insertion or Continuance of Line Prolonged intravenous therapies 09/08/21 1545  Exposed Catheter (cm) 0 cm 09/08/21 1545  Site Assessment Clean;Dry;Intact 09/08/21 1545  Line Status Flushed;Blood return noted;Saline locked 09/08/21 1545  Dressing Type Transparent 09/08/21 1545  Dressing Status Clean;Intact;Dry 09/08/21 1545  Antimicrobial disc in place? Yes 09/08/21 1545  Dressing Change Due 09/15/21 09/08/21 1545       Scotty Court 09/08/2021, 4:09 PM

## 2021-09-08 NOTE — Progress Notes (Addendum)
[facility] at [place] NAME: David [name]    MR#:  [id number]  DATE OF BIRTH:  [birth year]  SUBJECTIVE:  patient came in after repeated falls at home. He was seen in the emergency room two days ago but prescribed antibiotics to go home with. Complains of significant left knee pain unable to bend the knee. Fluid was aspirated which appears inflamed/infected. No fever.\ Lives with a roommate uses walker to walk at home. Has significant neuropathy due to diabetes leading to multiple falls  S/p arthroscopic knee washout on 10/10 REVIEW OF SYSTEMS:   Review of Systems  Constitutional:  Negative for chills, fever and weight loss.  HENT:  Negative for ear discharge, ear pain and nosebleeds.   Eyes:  Negative for blurred vision, pain and discharge.  Respiratory:  Negative for sputum production, shortness of breath, wheezing and stridor.   Cardiovascular:  Negative for chest pain, palpitations, orthopnea and PND.  Gastrointestinal:  Negative for abdominal pain, diarrhea, nausea and vomiting.  Genitourinary:  Negative for frequency and urgency.  Musculoskeletal:  Positive for joint pain. Negative for back pain.  Neurological:  Positive for weakness. Negative for sensory change, speech change and focal weakness.  Psychiatric/Behavioral:  Negative for depression and hallucinations. The patient is not nervous/anxious.   Tolerating Diet:yes Tolerating PT: rehab  DRUG ALLERGIES:  No Known Allergies  VITALS:  Blood pressure 105/63, pulse (!) 57, temperature (!) 96.9 F (36.1 C), resp. rate 14, height 5\' 9"  (1.753 m), weight 77.2 kg, SpO2 100 %.  PHYSICAL EXAMINATION:   Physical Exam  GENERAL:  71 y.o.-year-old patient lying in the bed with no acute distress.  LUNGS: Normal breath sounds bilaterally, no wheezing, rales, rhonchi. No use of accessory muscles of respiration.  CARDIOVASCULAR: S1, S2 normal. No murmurs, rubs, or gallops.  ABDOMEN: Soft,  nontender, nondistended. Bowel sounds present. No organomegaly or mass.  EXTREMITIES:   Decreased ROM left knee NEUROLOGIC: ++ sensory deficits b/l.   PSYCHIATRIC:  patient is alert and oriented x 3.  SKIN: as above  LABORATORY PANEL:  CBC Recent Labs  Lab 09/06/21 0421  WBC 13.3*  HGB 8.4*  HCT 24.8*  PLT 110*     Chemistries  Recent Labs  Lab 09/06/21 0421 09/07/21 0859 09/08/21 0650  NA 133*  --   --   K 4.8  --   --   CL 102  --   --   CO2 25  --   --   GLUCOSE 179*  --   --   BUN 22  --   --   CREATININE 1.03   < > 1.41*  CALCIUM 8.5*  --   --    < > = values in this interval not displayed.    Cardiac Enzymes No results for input(s): TROPONINI in the last 168 hours. RADIOLOGY:  No results found. ASSESSMENT AND PLAN:  David [name] is a 70 y.o. male with medical history significant for alcoholic cirrhosis, insulin-dependent type 2 diabetes, hypertension, CAD, hyperlipidemia and depression who presents with concerns of worsening knee pain.    left knee septic arthritis, staph aureus - Synovial fluid drawn showed pseudogout and greater than 140,000 WBC.  --Knee fluid Cultures Positive for Staph aureus --Orthopedic Dr. Sampson Si Arthroscopy with Arthrocentesis and wash out with partial medial meniscectomy on 10/10 --was on IV vancomycin and cefepime --ID consult for abx help--recommends IV vanc for now.  --Knee Fluid MSSA--pansensitive. Will await ID recs for Abxs --  oK to start PT per Dr Sabra Heck   Pseudogout/CPPD -Synovial fluid from left knee aspiration shows calcium pyrophosphate crystal - on po colchicine    Recurrent falls -suspect more due to neuropathy and LE weakness -PT eval rec rehab   Insulin-dependent type 2 diabetes with hyperglycemia, peripheral neuropathy -Last hemoglobin A1c of 6 on 9/23 - Home regimen includes Novolin N 30 units in the AM and 40 units PM. Mealtime sliding scale Humalog.  - Start with 15 units Levermir BID with moderate  SSI  now that sugars are trending up   Alcoholic cirrhosis chronic thrombocytopenia - No liver decompensation on admission - Reports current alcohol use of 6 pack of beer at least twice weekly -place on CIWA Protocol--scoring 0--d/c it   HTN  -on propranolol, losartan, spironolactone and lasix     DVT prophylaxis: SCDs Code Status: Full Family Communication: none. Pt does not have any family he wants me to discuss disposition Plan: Rehab in 2-3 days Consults called:  Admission status: inpatient   Level of care: Med-Surg   Status is: Inpatient   Remains inpatient appropriate because:Inpatient level of care appropriate due to severity of illness   Dispo: The patient is from: Home              Anticipated d/c is to: TBD              Patient currently is not medically stable to d/c.              Difficult to place patient No    TOC for d/c planning           TOTAL TIME TAKING CARE OF THIS PATIENT: 35 minutes.  >50% time spent on counselling and coordination of care  Note: This dictation was prepared with Dragon dictation along with smaller phrase technology. Any transcriptional errors that result from this process are unintentional.  Fritzi Mandes M.D    [facility]   CC: Primary care physician; [name], MD Patient ID: David [name], male   DOB: [birth year], 70 y.o.   MRN: [id number]

## 2021-09-08 NOTE — Evaluation (Addendum)
Physical Therapy Re-Evaluation Patient Details Name: David [name] MRN: [id number] DOB: 05-29-1951 Today's Date: 09/08/2021  History of Present Illness  Pt is a 70 y/o M admitted on 09/05/21 after presenting with c/o worsening knee pain. Pt presented to the ED the day prior with c/o hip & knee pain following a fall & was sent home with medication for R knee cellulitis. Pt returned home & had another fall where he landed on his knees. In the ED had repeat right knee x-ray showing anterior soft tissue swelling, negative left knee for fracture.  Given significant edema of the left knee he had aspiration in the ED showing calcium pyrophosphate crystals and significantly elevated WBC of >140,000 concerning for septic arthritis.  10/10 pt s/p L knee arthroscopy with I&D for sepsis; also arthroscopic partial medial meniscectomy. PMH: alcoholic cirrhosis, Insulin dependent Type 2 DM, HTN, CAD, HLD, depression, MI.  Clinical Impression  New PT consult received s/p surgery yesterday (10/10) so PT re-evaluation performed.  Prior to hospital admission, pt was ambulatory (with SPC in home and RW in community); lives with a room-mate in level entry apt.  Currently pt is mod to max assist semi-supine to sitting edge of bed; unable to stand up to RW from elevated bed and 1 assist; able to perform squat pivot bed to recliner with min to mod assist x1.  4-5/10 L knee pain end of session at rest (nurse notified--pt received recent pain medication).  Pt educated on L LE restrictions/precautions.  Pt would benefit from skilled PT to address noted impairments and functional limitations (see below for any additional details).  Upon hospital discharge, pt would benefit from SNF.  PT POC reviewed and updated.     Recommendations for follow up therapy are one component of a multi-disciplinary discharge planning process, led by the attending physician.  Recommendations may be updated based on patient status, additional functional  criteria and insurance authorization.  Follow Up Recommendations SNF    Equipment Recommendations  Rolling walker with 5" wheels;3in1 (PT);Wheelchair (measurements PT);Wheelchair cushion (measurements PT)    Recommendations for Other Services       Precautions / Restrictions Precautions Precautions: Fall Precaution Comments: ?back precautions (surgery on 06/20/21) Restrictions Weight Bearing Restrictions: Yes LLE Weight Bearing: Partial weight bearing Other Position/Activity Restrictions: PWB'ing L leg with walker; Do NOT attempt ROM of knee yet; KI at all times; elevate operative extremity      Mobility  Bed Mobility Overal bed mobility: Needs Assistance Bed Mobility: Supine to Sit     Supine to sit: Mod assist;Max assist;HOB elevated     General bed mobility comments: assist for trunk; vc's for technique    Transfers Overall transfer level: Needs assistance Equipment used: Rolling walker (2 wheeled);None Transfers: Sit to/from [employer] Sit to Stand: Total assist;From elevated surface   Squat pivot transfers: Min assist;Mod assist     General transfer comment: x2 trials attempting to stand up to RW (from bed) but unable to stand either attempt with 1 assist; squat pivot towards L bed to recliner with min to mod assist x1; vc's for technique and positioning required  Ambulation/Gait             General Gait Details: unable to stand up to walker to attempt  Stairs            Wheelchair Mobility    Modified Rankin (Stroke Patients Only)       Balance Overall balance assessment: Needs assistance Sitting-balance support: Single extremity supported;Feet  supported Sitting balance-Leahy Scale: Fair Sitting balance - Comments: steady static sitting       Standing balance comment: unable to stand up to walker to attempt/assess                             Pertinent Vitals/Pain Pain Assessment: 0-10 Pain Score: 5   Pain Location: L knee Pain Descriptors / Indicators: Aching;Sore Pain Intervention(s): Limited activity within patient's tolerance;Monitored during session;Premedicated before session;Repositioned Vitals (HR and O2 on room air) stable and WFL throughout treatment session.    Home Living Family/patient expects to be discharged to:: Private residence Living Arrangements: Non-relatives/Friends (Room-mate)   Type of Home: Apartment Home Access: Level entry     Home Layout: One level Home Equipment: Cane - quad;Grab bars - tub/shower;Hand held Tourist information centre manager - 2 wheels      Prior Function Level of Independence: Independent with assistive device(s)         Comments: Pt typically ambulates with RW longer distances/in community but uses cane in home.  Pt reports 2 falls a week for at least past 6 months (pt reports tripping on carpet in home d/t catching toes on carpet)     Hand Dominance        Extremity/Trunk Assessment   Upper Extremity Assessment Upper Extremity Assessment: Generalized weakness    Lower Extremity Assessment Lower Extremity Assessment: LLE deficits/detail (R LE WFL) LLE: Unable to fully assess due to immobilization;Unable to fully assess due to pain    Cervical / Trunk Assessment Cervical / Trunk Assessment:  (bruising and abrasions noted on pt's body)  Communication   Communication: HOH  Cognition Arousal/Alertness: Awake/alert Behavior During Therapy: WFL for tasks assessed/performed Overall Cognitive Status: Within Functional Limits for tasks assessed                                 General Comments: Pleasant and joking around during session      General Comments General comments (skin integrity, edema, etc.): L LE in [place].  Nursing cleared pt for participation in physical therapy.  Pt agreeable to PT session.    Exercises  Transfer training   Assessment/Plan    PT Assessment Patient needs continued PT services  PT Problem  List Decreased mobility;Decreased strength;Decreased range of motion;Decreased activity tolerance;Decreased skin integrity;Pain;Decreased knowledge of use of DME;Decreased balance;Decreased knowledge of precautions       PT Treatment Interventions DME instruction;Gait training;Balance training;Therapeutic exercise;Neuromuscular re-education;Functional mobility training;Therapeutic activities;Patient/family education;Wheelchair mobility training    PT Goals (Current goals can be found in the Care Plan section)  Acute Rehab PT Goals Patient Stated Goal: to improve mobility PT Goal Formulation: With patient Time For Goal Achievement: 09/22/21 Potential to Achieve Goals: Good    Frequency 7X/week   Barriers to discharge Decreased caregiver support      Co-evaluation               AM-PAC PT "6 Clicks" Mobility  Outcome Measure Help needed turning from your back to your side while in a flat bed without using bedrails?: A Little Help needed moving from lying on your back to sitting on the side of a flat bed without using bedrails?: A Lot Help needed moving to and from a bed to a chair (including a wheelchair)?: A Lot Help needed standing up from a chair using your arms (e.g., wheelchair or bedside chair)?: Total  Help needed to walk in hospital room?: Total Help needed climbing 3-5 steps with a railing? : Total 6 Click Score: 10    End of Session Equipment Utilized During Treatment: Gait belt Activity Tolerance: Patient tolerated treatment well Patient left: in chair;with call bell/phone within reach;with chair alarm set;Other (comment) (L LE in [place] and elevated on pillow with heel floating) Nurse Communication: Mobility status;Precautions;Weight bearing status PT Visit Diagnosis: Unsteadiness on feet (R26.81);History of falling (Z91.81);Difficulty in walking, not elsewhere classified (R26.2);Pain;Other abnormalities of gait and mobility (R26.89);Muscle weakness (generalized)  (M62.81) Pain - Right/Left: Left Pain - part of body: Knee    Time: 6659-9357 PT Time Calculation (min) (ACUTE ONLY): 40 min   Charges:   PT Evaluation $PT Re-evaluation: 1 Re-eval PT Treatments $Therapeutic Activity: 23-37 mins       Leitha Bleak, PT 09/08/21, 5:33 PM

## 2021-09-08 NOTE — Progress Notes (Signed)
PT Cancellation Note  Patient Details Name: David [name] MRN: [id number] DOB: [birth year]   Cancelled Treatment:    Reason Eval/Treat Not Completed: Other (comment).  PT order noted to be cancelled/discontinued; d/t this will sign off.  Please re-consult PT if acute PT needs are identified.  Leitha Bleak, PT 09/08/21, 8:14 AM

## 2021-09-08 NOTE — Progress Notes (Signed)
Subjective: 1 Day Post-Op Procedure(s) (LRB): IRRIGATION AND DEBRIDEMENT OF LEFT KNEE; ARTHROSCOPY OF LEFT KNEE, PARTIAL MEDIAL MINISECTOMY. (Left) Patient is alert and awake today.  The left knee is more comfortable.  There is CBC shows the white count is down to 7900.  I have removed his drain.  He will need IV antibiotics per medical service.  Patient reports pain as mild.  Objective:   VITALS:   Vitals:   09/08/21 0739 09/08/21 1122  BP: 105/63 110/70  Pulse: (!) 57 (!) 59  Resp: 14 15  Temp: (!) 96.9 F (36.1 C) (!) 97.2 F (36.2 C)  SpO2: 100% 100%    Neurologically intact Incision: dressing C/D/I  LABS Recent Labs    09/05/21 2310 09/06/21 0421 09/08/21 1008  HGB 9.1* 8.4* 8.6*  HCT 26.7* 24.8* 25.3*  WBC 13.7* 13.3* 7.9  PLT 118* 110* 124*    Recent Labs    09/05/21 2310 09/06/21 0421 09/07/21 0859 09/08/21 0650  NA 131* 133*  --   --   K 5.2* 4.8  --   --   BUN 22 22  --   --   CREATININE 1.08 1.03 1.24 1.41*  GLUCOSE 223* 179*  --   --     No results for input(s): LABPT, INR in the last 72 hours.   Assessment/Plan: 1 Day Post-Op Procedure(s) (LRB): IRRIGATION AND DEBRIDEMENT OF LEFT KNEE; ARTHROSCOPY OF LEFT KNEE, PARTIAL MEDIAL MINISECTOMY. (Left)   Advance diet Up with therapy Discharge to SNF

## 2021-09-08 NOTE — Progress Notes (Signed)
Infectious Disease Long Term IV Antibiotic Orders Yousof Alderman Sep 12, 1951  Diagnosis: MSSA septic knee  Culture results Results for orders placed or performed during the hospital encounter of 09/05/21  Body fluid culture w Gram Stain     Status: None   Collection Time: 09/05/21 10:16 PM   Specimen: KNEE; Body Fluid  Result Value Ref Range Status   Specimen Description   Final    KNEE Performed at [facility], [address], [place], [place] [address]    Special Requests   Final    NONE Performed at [facility], [address] [place], [place] [address]    Gram Stain   Final    NO SQUAMOUS EPITHELIAL CELLS SEEN MODERATE WBC PRESENT, PREDOMINANTLY PMN NO ORGANISMS SEEN    Culture   Final    RARE STAPHYLOCOCCUS AUREUS CRITICAL RESULT CALLED TO, READ BACK BY AND VERIFIED WITH: RN L.SHIFFLETT AT 6734 ON 09/07/2021 BY T.SAAD. Performed at [facility], [place] [address] [place],  [address]    Report Status 09/08/2021 FINAL  Final   Organism ID, Bacteria STAPHYLOCOCCUS AUREUS  Final      Susceptibility   Staphylococcus aureus - MIC*    CIPROFLOXACIN <=0.5 SENSITIVE Sensitive     ERYTHROMYCIN <=0.25 SENSITIVE Sensitive     GENTAMICIN <=0.5 SENSITIVE Sensitive     OXACILLIN <=0.25 SENSITIVE Sensitive     TETRACYCLINE <=1 SENSITIVE Sensitive     VANCOMYCIN 1 SENSITIVE Sensitive     TRIMETH/SULFA <=10 SENSITIVE Sensitive     CLINDAMYCIN <=0.25 SENSITIVE Sensitive     RIFAMPIN <=0.5 SENSITIVE Sensitive     Inducible Clindamycin NEGATIVE Sensitive     * RARE STAPHYLOCOCCUS AUREUS  Resp Panel by RT-PCR (Flu A&B, Covid) Nasopharyngeal Swab     Status: None   Collection Time: 09/06/21  2:27 AM   Specimen: Nasopharyngeal Swab; Nasopharyngeal(NP) swabs in vial transport medium  Result Value Ref Range Status   SARS Coronavirus 2 by RT PCR NEGATIVE NEGATIVE Final    Comment: (NOTE) SARS-CoV-2 target nucleic acids are NOT DETECTED.  The SARS-CoV-2 RNA  is generally detectable in upper respiratory specimens during the acute phase of infection. The lowest concentration of SARS-CoV-2 viral copies this assay can detect is 138 copies/mL. A negative result does not preclude SARS-Cov-2 infection and should not be used as the sole basis for treatment or other patient management decisions. A negative result may occur with  improper specimen collection/handling, submission of specimen other than nasopharyngeal swab, presence of viral mutation(s) within the areas targeted by this assay, and inadequate number of viral copies(<138 copies/mL). A negative result must be combined with clinical observations, patient history, and epidemiological information. The expected result is Negative.  Fact Sheet for Patients:  EntrepreneurPulse.com.au  Fact Sheet for Healthcare Providers:  IncredibleEmployment.be  This test is no t yet approved or cleared by the [place] FDA and  has been authorized for detection and/or diagnosis of SARS-CoV-2 by FDA under an Emergency Use Authorization (EUA). This EUA will remain  in effect (meaning this test can be used) for the duration of the COVID-19 declaration under Section 564(b)(1) of the Act, 21 U.S.C.section 360bbb-3(b)(1), unless the authorization is terminated  or revoked sooner.       Influenza A by PCR NEGATIVE NEGATIVE Final   Influenza B by PCR NEGATIVE NEGATIVE Final    Comment: (NOTE) The Xpert Xpress SARS-CoV-2/FLU/RSV plus assay is intended as an aid in the diagnosis of influenza from Nasopharyngeal  swab specimens and should not be used as a sole basis for treatment. Nasal washings and aspirates are unacceptable for Xpert Xpress SARS-CoV-2/FLU/RSV testing.  Fact Sheet for Patients: EntrepreneurPulse.com.au  Fact Sheet for Healthcare Providers: IncredibleEmployment.be  This test is not yet approved or cleared by the  [place] FDA and has been authorized for detection and/or diagnosis of SARS-CoV-2 by FDA under an Emergency Use Authorization (EUA). This EUA will remain in effect (meaning this test can be used) for the duration of the COVID-19 declaration under Section 564(b)(1) of the Act, 21 U.S.C. section 360bbb-3(b)(1), unless the authorization is terminated or revoked.  Performed at [facility], [address] [place], [place] [address]      LABS Lab Results  Component Value Date   CREATININE 1.41 (H) 09/08/2021   Lab Results  Component Value Date   WBC 7.9 09/08/2021   HGB 8.6 (L) 09/08/2021   HCT 25.3 (L) 09/08/2021   MCV 103.3 (H) 09/08/2021   PLT 124 (L) 09/08/2021   Lab Results  Component Value Date   ESRSEDRATE 75 (H) 08/21/2021   No results found for: CRP  Allergies: No Known Allergies  Discharge antibiotics Cefazolin      6 grams every  24 Hours  PICC Care per protocol Labs weekly while on IV antibiotics -FAX weekly labs to [phone] CBC w diff   Cr  CRP   Planned duration of antibiotics 2 weeks from 10/10  Stop date 10/24     Follow up clinic date  10-14 days  [name], MD

## 2021-09-09 DIAGNOSIS — D696 Thrombocytopenia, unspecified: Secondary | ICD-10-CM

## 2021-09-09 DIAGNOSIS — W19XXXA Unspecified fall, initial encounter: Secondary | ICD-10-CM

## 2021-09-09 DIAGNOSIS — D539 Nutritional anemia, unspecified: Secondary | ICD-10-CM

## 2021-09-09 LAB — GLUCOSE, CAPILLARY
Glucose-Capillary: 277 mg/dL — ABNORMAL HIGH (ref 70–99)
Glucose-Capillary: 260 mg/dL — ABNORMAL HIGH (ref 70–99)
Glucose-Capillary: 175 mg/dL — ABNORMAL HIGH (ref 70–99)
Glucose-Capillary: 179 mg/dL — ABNORMAL HIGH (ref 70–99)

## 2021-09-09 LAB — BASIC METABOLIC PANEL
Anion gap: 7 (ref 5–15)
BUN: 37 mg/dL — ABNORMAL HIGH (ref 8–23)
CO2: 21 mmol/L — ABNORMAL LOW (ref 22–32)
Calcium: 8.2 mg/dL — ABNORMAL LOW (ref 8.9–10.3)
Chloride: 102 mmol/L (ref 98–111)
Creatinine, Ser: 1.31 mg/dL — ABNORMAL HIGH (ref 0.61–1.24)
GFR, Estimated: 58 mL/min — ABNORMAL LOW (ref >60)
Glucose, Bld: 209 mg/dL — ABNORMAL HIGH (ref 70–99)
Potassium: 4 mmol/L (ref 3.5–5.1)
Sodium: 130 mmol/L — ABNORMAL LOW (ref 135–145)

## 2021-09-09 LAB — RESP PANEL BY RT-PCR (FLU A&B, COVID) ARPGX2
Influenza A by PCR: NEGATIVE
Influenza B by PCR: NEGATIVE
SARS Coronavirus 2 by RT PCR: NEGATIVE

## 2021-09-09 MED ORDER — FREESTYLE LIBRE 2 SENSOR MISC
0 refills | Status: DC
Start: 2021-09-09 — End: 2021-10-16

## 2021-09-09 MED ORDER — CEFAZOLIN IV (FOR PTA / DISCHARGE USE ONLY): 2.0000 g | Freq: Three times a day (TID) | INTRAVENOUS | 0 refills | Status: AC

## 2021-09-09 MED ORDER — FREESTYLE LIBRE 2 READER DEVI: 0 refills | Status: DC

## 2021-09-09 MED ORDER — OXYCODONE-ACETAMINOPHEN 5-325 MG PO TABS: 1.0000 | ORAL_TABLET | Freq: Four times a day (QID) | ORAL | 0 refills | Status: AC | PRN

## 2021-09-09 MED ORDER — COLCHICINE 0.6 MG PO TABS: 0.6000 mg | ORAL_TABLET | Freq: Two times a day (BID) | ORAL | Status: DC

## 2021-09-09 MED ORDER — ASPIRIN EC 81 MG PO TBEC
81.0000 mg | DELAYED_RELEASE_TABLET | Freq: Every day | ORAL | Status: DC
  Administered 2021-09-09: 81 mg via ORAL
  Filled 2021-09-09 (×2): qty 1

## 2021-09-09 MED ORDER — SODIUM CHLORIDE 0.9 % IV SOLN: INTRAVENOUS | Status: DC

## 2021-09-09 MED ORDER — ASPIRIN 81 MG PO TBEC: 81.0000 mg | DELAYED_RELEASE_TABLET | Freq: Every day | ORAL | Status: DC

## 2021-09-09 NOTE — Progress Notes (Signed)
[facility] INFECTIOUS DISEASE PROGRESS NOTE Date of Admission:  09/05/2021     ID: David [name] is a 70 y.o. male with MRSA septic knee Active Problems:   Essential hypertension   Knee pain   Pseudogout of knee, left   Cellulitis   Alcoholic cirrhosis of liver without ascites (HCC)   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 ([place])   Staphylococcal arthritis of left knee (HCC)   Subjective: No fevers, picc placed. Going to rehab today  ROS  Eleven systems are reviewed and negative except per hpi  Medications:  Antibiotics Given (last 72 hours)     Date/Time Action Medication Dose Rate   09/06/21 1637 New Bag/Given   ceFEPIme (MAXIPIME) 2 g in sodium chloride 0.9 % 100 mL IVPB 2 g 200 mL/hr   09/07/21 0200 New Bag/Given   ceFEPIme (MAXIPIME) 2 g in sodium chloride 0.9 % 100 mL IVPB 2 g 200 mL/hr   09/07/21 0255 New Bag/Given   vancomycin (VANCOREADY) IVPB 1500 mg/300 mL 1,500 mg 150 mL/hr   09/07/21 1011 New Bag/Given   ceFEPIme (MAXIPIME) 2 g in sodium chloride 0.9 % 100 mL IVPB 2 g 200 mL/hr   09/08/21 0207 New Bag/Given   vancomycin (VANCOREADY) IVPB 1250 mg/250 mL 1,250 mg 166.7 mL/hr   09/08/21 1632 New Bag/Given   ceFAZolin (ANCEF) IVPB 2g/100 mL premix 2 g 200 mL/hr   09/08/21 2208 New Bag/Given   ceFAZolin (ANCEF) IVPB 2g/100 mL premix 2 g 200 mL/hr   09/09/21 0735 New Bag/Given   ceFAZolin (ANCEF) IVPB 2g/100 mL premix 2 g 200 mL/hr       vitamin C  1,000 mg Oral Daily   Chlorhexidine Gluconate Cloth  6 each Topical Daily   colchicine  0.6 mg Oral BID   ferrous sulfate  325 mg Oral Q breakfast   furosemide  20 mg Oral Daily   insulin aspart  0-15 Units Subcutaneous TID WC   insulin glargine-yfgn  15 Units Subcutaneous BID   losartan  50 mg Oral Daily   multivitamin with minerals  1 tablet Oral QHS   pantoprazole  40 mg Oral BID   PARoxetine  40 mg Oral BH-q7a   polyvinyl alcohol  2 drop Both Eyes Daily   propranolol  80 mg Oral BID   rosuvastatin   10 mg Oral Daily   sodium chloride flush  10-40 mL Intracatheter Q12H   spironolactone  50 mg Oral Daily    Objective: Vital signs in last 24 hours: Temp:  [98 F (36.7 C)-98.8 F (37.1 C)] 98.4 F (36.9 C) (10/12 1159) Pulse Rate:  [60-68] 61 (10/12 1159) Resp:  [15-20] 16 (10/12 1159) BP: (97-118)/(60-75) 109/68 (10/12 1159) SpO2:  [98 %-100 %] 100 % (10/12 1159) General: Alert, cooperative, no distress, at rest appears stated age.  Head: Normocephalic, without obvious abnormality, atraumatic. Eyes: Conjunctivae clear, anicteric sclerae. Pupils are equal ENT Nares normal. No drainage or sinus tenderness. Lips, mucosa, and tongue normal. No Thrush Neck: Supple, symmetrical, no adenopathy, thyroid: non tender no carotid bruit and no JVD. Back: Scar over the thoracolumbar area has healed very well /lungs: Clear to auscultation bilaterally. No Wheezing or Rhonchi. No rales. Heart: Regular rate and rhythm, no murmur, rub or gallop. Abdomen: Soft, non-tender,not distended. Bowel sounds normal. No masses Extremities: Wounds on R  knees with scabs On the toes he is got multiple scabs Left knee is in immobilizer post op Skin: As above Lymph: Cervical, supraclavicular normal. Neurologic: Did  not examine in detail   Lab Results Recent Labs    09/07/21 0859 09/08/21 0650 09/08/21 1008  WBC  --   --  7.9  HGB  --   --  8.6*  HCT  --   --  25.3*  CREATININE 1.24 1.41*  --     Microbiology: Results for orders placed or performed during the hospital encounter of 09/05/21  Body fluid culture w Gram Stain     Status: None   Collection Time: 09/05/21 10:16 PM   Specimen: KNEE; Body Fluid  Result Value Ref Range Status   Specimen Description   Final    KNEE Performed at [facility], [address] [place], [place] [address]    Special Requests   Final    NONE Performed at [facility], [address] [place], [place] [address]    Gram Stain   Final     NO SQUAMOUS EPITHELIAL CELLS SEEN MODERATE WBC PRESENT, PREDOMINANTLY PMN NO ORGANISMS SEEN    Culture   Final    RARE STAPHYLOCOCCUS AUREUS CRITICAL RESULT CALLED TO, READ BACK BY AND VERIFIED WITH: RN L.SHIFFLETT AT 1244 ON 09/07/2021 BY T.SAAD. Performed at [facility], [place] [address] [place], [place] [address]    Report Status 09/08/2021 FINAL  Final   Organism ID, Bacteria STAPHYLOCOCCUS AUREUS  Final      Susceptibility   Staphylococcus aureus - MIC*    CIPROFLOXACIN <=0.5 SENSITIVE Sensitive     ERYTHROMYCIN <=0.25 SENSITIVE Sensitive     GENTAMICIN <=0.5 SENSITIVE Sensitive     OXACILLIN <=0.25 SENSITIVE Sensitive     TETRACYCLINE <=1 SENSITIVE Sensitive     VANCOMYCIN 1 SENSITIVE Sensitive     TRIMETH/SULFA <=10 SENSITIVE Sensitive     CLINDAMYCIN <=0.25 SENSITIVE Sensitive     RIFAMPIN <=0.5 SENSITIVE Sensitive     Inducible Clindamycin NEGATIVE Sensitive     * RARE STAPHYLOCOCCUS AUREUS  Resp Panel by RT-PCR (Flu A&B, Covid) Nasopharyngeal Swab     Status: None   Collection Time: 09/06/21  2:27 AM   Specimen: Nasopharyngeal Swab; Nasopharyngeal(NP) swabs in vial transport medium  Result Value Ref Range Status   SARS Coronavirus 2 by RT PCR NEGATIVE NEGATIVE Final    Comment: (NOTE) SARS-CoV-2 target nucleic acids are NOT DETECTED.  The SARS-CoV-2 RNA is generally detectable in upper respiratory specimens during the acute phase of infection. The lowest concentration of SARS-CoV-2 viral copies this assay can detect is 138 copies/mL. A negative result does not preclude SARS-Cov-2 infection and should not be used as the sole basis for treatment or other patient management decisions. A negative result may occur with  improper specimen collection/handling, submission of specimen other than nasopharyngeal swab, presence of viral mutation(s) within the areas targeted by this assay, and inadequate number of viral copies(<138 copies/mL). A negative result must be  combined with clinical observations, patient history, and epidemiological information. The expected result is Negative.  Fact Sheet for Patients:  EntrepreneurPulse.com.au  Fact Sheet for Healthcare Providers:  IncredibleEmployment.be  This test is no t yet approved or cleared by the [place] FDA and  has been authorized for detection and/or diagnosis of SARS-CoV-2 by FDA under an Emergency Use Authorization (EUA). This EUA will remain  in effect (meaning this test can be used) for the duration of the COVID-19 declaration under Section 564(b)(1) of the Act, 21 U.S.C.section 360bbb-3(b)(1), unless the authorization is terminated  or revoked sooner.  Influenza A by PCR NEGATIVE NEGATIVE Final   Influenza B by PCR NEGATIVE NEGATIVE Final    Comment: (NOTE) The Xpert Xpress SARS-CoV-2/FLU/RSV plus assay is intended as an aid in the diagnosis of influenza from Nasopharyngeal swab specimens and should not be used as a sole basis for treatment. Nasal washings and aspirates are unacceptable for Xpert Xpress SARS-CoV-2/FLU/RSV testing.  Fact Sheet for Patients: EntrepreneurPulse.com.au  Fact Sheet for Healthcare Providers: IncredibleEmployment.be  This test is not yet approved or cleared by the [place] FDA and has been authorized for detection and/or diagnosis of SARS-CoV-2 by FDA under an Emergency Use Authorization (EUA). This EUA will remain in effect (meaning this test can be used) for the duration of the COVID-19 declaration under Section 564(b)(1) of the Act, 21 U.S.C. section 360bbb-3(b)(1), unless the authorization is terminated or revoked.  Performed at [facility], [address] [place], [place] [address]     Studies/Results: [place] EKG SITE RITE  Result Date: 09/08/2021 If [facility] image not attached, placement could not be confirmed due to current cardiac rhythm.    Assessment/Plan: 70 y.o. male with a history of type 2 diabetes, T11-T12 stenosis with myelopathy, for which he underwent laminectomy of T11 and T12 with resection of adherent synovial cyst and microdiscectomy on 06/19/2021., frequent falls, presents with pain and swelling of the right knee after having a recent fall.  Found to have MSSA septic knee. Underwent wash out 10/10. Presumed direct infection of knee from overlying wounds.    REC Will plan on 2 weeks IV cefazolin with otpt FU. Will follow crp and clinically and change to orals if needed at that time.  PICC care. See OPAT note Thank you very much for the consult. Will follow with you.  [name]   09/09/2021, 2:10 PM

## 2021-09-09 NOTE — Assessment & Plan Note (Signed)
--   Stable here.  Continue long-acting insulin, meal coverage, recommend sliding scale insulin at nursing facility. -- Patient requested freestyle libre continuous glucose monitor, sample provided, can follow-up with endocrinologist as an outpatient --[place] Reader: Order # [id number], [place] Night Sensor: Order # [id number]

## 2021-09-09 NOTE — Assessment & Plan Note (Signed)
Continue colchicine °

## 2021-09-09 NOTE — Progress Notes (Signed)
PHARMACY CONSULT NOTE FOR:  OUTPATIENT  PARENTERAL ANTIBIOTIC THERAPY (OPAT)  Indication: MSSA septic knee Regimen: cefazolin 2gm IV q8h End date: 09/21/2021  IV antibiotic discharge orders are pended. To discharging provider:  please sign these orders via discharge navigator,  Select New Orders & click on the button choice - Manage This Unsigned Work.     Thank you for allowing pharmacy to be a part of this patient's care.  Doreene Eland, PharmD, BCPS.   Work Cell: [phone] 09/09/2021 1:21 PM

## 2021-09-09 NOTE — TOC Progression Note (Signed)
Transition of Care [facility]) - Progression Note    Patient Details  Name: David [name] MRN: [id number] Date of Birth: 1951-08-10  Transition of Care [facility]) CM/SW [place], RN Phone Number: 09/09/2021, 2:57 PM  Clinical Narrative:    Helene Kelp called and stated that they have to have the admissions packet signed and received prior to them accepting the patient, she is getting the packet together and is going to email it to me for him to sign, Will get the patient to sign and then email back to [facility], she stated that he will go tomorrow   Expected Discharge Plan: Skilled Nursing Facility Barriers to Discharge: [place] ([place]), Insurance Authorization, Continued Medical Work up  Expected Discharge Plan and Services Expected Discharge Plan: [place] Choice: [place] arrangements for the past 2 months: Apartment Expected Discharge Date: 09/09/21                                     Social Determinants of Health (SDOH) Interventions    Readmission Risk Interventions No flowsheet data found.

## 2021-09-09 NOTE — Assessment & Plan Note (Signed)
--  plan SNF

## 2021-09-09 NOTE — Progress Notes (Signed)
Physical Therapy Treatment Patient Details Name: David [name] MRN: [id number] DOB: 09-09-1951 Today's Date: 09/09/2021   History of Present Illness Pt is a 70 y/o M admitted on 09/05/21 after presenting with c/o worsening knee pain. Pt presented to the ED the day prior with c/o hip & knee pain following a fall & was sent home with medication for R knee cellulitis. Pt returned home & had another fall where he landed on his knees. In the ED had repeat right knee x-ray showing anterior soft tissue swelling, negative left knee for fracture.  Given significant edema of the left knee he had aspiration in the ED showing calcium pyrophosphate crystals and significantly elevated WBC of >140,000 concerning for septic arthritis.  10/10 pt s/p L knee arthroscopy with I&D for sepsis; also arthroscopic partial medial meniscectomy. PMH: alcoholic cirrhosis, Insulin dependent Type 2 DM, HTN, CAD, HLD, depression, MI.    PT Comments    Pt was long sitting in bed upon arriving. New MD orders for gentle ROM to LLE now allowed. Pt did much better today with transfers and standing activity. Was able to exit L side of bed with increased time and mod assist. Stood from slightly elevated bed height with mod assist prior to ambulating with RW to doorway of room and return. Removed knee immobilizer in sitting and performed gentle ROM prior to reapplying knee immobilizer and pt returning to bed. Overall pt is progressing but will greatly benefit from SNF at [place] to address deficits while assisting pt to PLOF.    Recommendations for follow up therapy are one component of a multi-disciplinary discharge planning process, led by the attending physician.  Recommendations may be updated based on patient status, additional functional criteria and insurance authorization.  Follow Up Recommendations  SNF     Equipment Recommendations  Other (comment) (defer to next level of care)       Precautions / Restrictions  Precautions Precautions: Fall Restrictions Weight Bearing Restrictions: Yes LLE Weight Bearing: Partial weight bearing Other Position/Activity Restrictions: PWB'ing L leg with walker;  KI at all times; elevate operative extremity     Mobility  Bed Mobility Overal bed mobility: Needs Assistance Bed Mobility: Supine to Sit Rolling: Min assist Sidelying to sit: Mod assist;Max assist Supine to sit: Mod assist;Max assist;HOB elevated     General bed mobility comments: increased time to perform 2/2 to pain    Transfers Overall transfer level: Needs assistance Equipment used: Rolling walker (2 wheeled) Transfers: Sit to/from Stand Sit to Stand: Mod assist;From elevated surface         General transfer comment: pt with much improved abilities to stand today versus yesterday  Ambulation/Gait Ambulation/Gait assistance: Min assist Gait Distance (Feet): 30 Feet Assistive device: Rolling walker (2 wheeled) Gait Pattern/deviations: Step-to pattern;Antalgic Gait velocity: decreased   General Gait Details: Pt was able to adhere to [facility] throughout gait training. he ambulated from L side of bed to doorway and return. Does endorse fatigue but overall tolerated well.    Balance Overall balance assessment: Needs assistance Sitting-balance support: Single extremity supported;Feet supported Sitting balance-Leahy Scale: Good     Standing balance support: Bilateral upper extremity supported;During functional activity Standing balance-Leahy Scale: Fair Standing balance comment: reliant on UE support during however no LOB or unsteadiness with gait or standing activity      Cognition Arousal/Alertness: Awake/alert Behavior During Therapy: WFL for tasks assessed/performed Overall Cognitive Status: Within Functional Limits for tasks assessed      General Comments: Pleasant and joking  around during session         General Comments General comments (skin integrity, edema, etc.):  performed gentle ROM to LLE in sitting per new MD orders      Pertinent Vitals/Pain Pain Assessment: 0-10 Pain Score: 6  Faces Pain Scale: Hurts little more Pain Location: L knee Pain Descriptors / Indicators: Aching;Sore Pain Intervention(s): Limited activity within patient's tolerance;Monitored during session;Premedicated before session;Repositioned     PT Goals (current goals can now be found in the care plan section) Acute Rehab PT Goals Patient Stated Goal: to improve mobility Progress towards PT goals: Progressing toward goals    Frequency    7X/week      PT Plan Current plan remains appropriate       AM-PAC PT "6 Clicks" Mobility   Outcome Measure  Help needed turning from your back to your side while in a flat bed without using bedrails?: A Little Help needed moving from lying on your back to sitting on the side of a flat bed without using bedrails?: A Lot Help needed moving to and from a bed to a chair (including a wheelchair)?: A Lot Help needed standing up from a chair using your arms (e.g., wheelchair or bedside chair)?: A Little Help needed to walk in hospital room?: A Little Help needed climbing 3-5 steps with a railing? : A Lot 6 Click Score: 15    End of Session Equipment Utilized During Treatment: Gait belt Activity Tolerance: Patient tolerated treatment well Patient left: in bed;with call bell/phone within reach;with bed alarm set Nurse Communication: Mobility status;Precautions;Weight bearing status PT Visit Diagnosis: Unsteadiness on feet (R26.81);History of falling (Z91.81);Difficulty in walking, not elsewhere classified (R26.2);Pain;Other abnormalities of gait and mobility (R26.89);Muscle weakness (generalized) (M62.81) Pain - Right/Left: Left Pain - part of body: Knee     Time: 1440-1505 PT Time Calculation (min) (ACUTE ONLY): 25 min  Charges:  $Gait Training: 8-22 mins $Therapeutic Exercise: 8-22 mins                    Julaine Fusi  PTA 09/09/21, 4:17 PM

## 2021-09-09 NOTE — Anesthesia Preprocedure Evaluation (Signed)
Anesthesia Evaluation  Patient identified by MRN, date of birth, ID band Patient awake    Reviewed: Allergy & Precautions, H&P , NPO status , Patient's Chart, lab work & pertinent test results, reviewed documented beta blocker date and time   Airway Mallampati: II  TM Distance: >3 FB Neck ROM: full    Dental  (+) Teeth Intact   Pulmonary sleep apnea and Continuous Positive Airway Pressure Ventilation , former smoker,    Pulmonary exam normal        Cardiovascular Exercise Tolerance: Poor hypertension, + CAD and + Past MI  Normal cardiovascular exam Rate:Normal     Neuro/Psych PSYCHIATRIC DISORDERS Depression negative neurological ROS     GI/Hepatic Neg liver ROS, GERD  Medicated,  Endo/Other  negative endocrine ROSdiabetes  Renal/GU negative Renal ROS  negative genitourinary   Musculoskeletal   Abdominal   Peds  Hematology  (+) Blood dyscrasia, anemia ,   Anesthesia Other Findings   Reproductive/Obstetrics negative OB ROS                             Anesthesia Physical Anesthesia Plan  ASA: 3  Anesthesia Plan: General LMA   Post-op Pain Management:    Induction:   PONV Risk Score and Plan: 3  Airway Management Planned:   Additional Equipment:   Intra-op Plan:   Post-operative Plan:   Informed Consent: I have reviewed the patients History and Physical, chart, labs and discussed the procedure including the risks, benefits and alternatives for the proposed anesthesia with the patient or authorized representative who has indicated his/her understanding and acceptance.       Plan Discussed with: CRNA  Anesthesia Plan Comments:         Anesthesia Quick Evaluation

## 2021-09-09 NOTE — Assessment & Plan Note (Signed)
--  stable, likely related to alcohol use, poor nutrition, suggest check B12 and folate as outpatient

## 2021-09-09 NOTE — Assessment & Plan Note (Signed)
--  stable, secondary to cirrhosis, follow-up as an outpatient

## 2021-09-09 NOTE — TOC Progression Note (Signed)
Transition of Care [facility]) - Progression Note    Patient Details  Name: David [name] MRN: [id number] Date of Birth: 05/31/1951  Transition of Care [facility]) CM/SW [place], RN Phone Number: 09/09/2021, 8:38 AM  Clinical Narrative:   Received notification of the insurance approval, Auth number [id number] Thee patient will go to [facility] SN[facility] with IV ABX for 2 weeks cefazolin, Central Line placed.    Expected Discharge Plan: Skilled Nursing Facility Barriers to Discharge: [place] ([facility]), Insurance Authorization, Continued Medical Work up  Expected Discharge Plan and Services Expected Discharge Plan: [place] Choice: [place] arrangements for the past 2 months: Apartment                                       Social Determinants of Health (SDOH) Interventions    Readmission Risk Interventions No flowsheet data found.

## 2021-09-09 NOTE — Progress Notes (Signed)
Inpatient Diabetes Program Recommendations  AACE/ADA: New Consensus Statement on Inpatient Glycemic Control (2015)  Target Ranges:  Prepandial:   less than 140 mg/dL      Peak postprandial:   less than 180 mg/dL (1-2 hours)      Critically ill patients:  140 - 180 mg/dL  Results for David [name], David [name] (MRN [id number]) as of 09/09/2021 07:46  Ref. Range 09/08/2021 07:41 09/08/2021 11:22 09/08/2021 16:13 09/08/2021 19:58  Glucose-Capillary Latest Ref Range: 70 - 99 mg/dL 351 (H)  15 units Novolog @0905  282 (H)  8 units Novolog @1300   15 units Semglee @1300  219 (H)  5 units Novolog  229 (H)      15 units Semglee @2203   Results for David [name], David [name] (MRN [id number]) as of 09/09/2021 07:46  Ref. Range 09/09/2021 07:39  Glucose-Capillary Latest Ref Range: 70 - 99 mg/dL 277 (H)    Home DM Meds: Humalog 14-16 units tid     NPH 30 units q AM     NPH 35-40 units q PM     Metformin 1000 mg bid  Current Orders: Semglee 15 units BID  Novolog 0-15 units TID    Got Decadron PM of 10/10--CBGs elevated 10/11.    No Basal insulin given 10/10--Started Semglee 15 BID yest AM.  Endocrinologist: Dr. Cruzita Lederer with [facility] Last seen 07/14/2021 Was told to take the following:  Metformin 1000 mg BID Ozempic 0.5 mg qweek NPH Insulin 30 units AM/ 40 units QHS Humalog 14-16 units TID with meals ENDo also discussed starting Freestyle Libre with patient    MD- Note CBG 277 this AM. CBGs >200 yest as well.  Please consider:  1. Increase Semglee to 20 units BID  2. Start Novolog Meal Coverage: Novolog 6 units TID with meals (takes 14-16 units Humalog TID with meals at home)  3. Change PO diet to Carbohydrate Modified (currently ordered as Regular)  4. Patient requesting Freestyle Libre CGM at time of discharge.  The Diabetes Team can provide a free sample of the CGM to pt for 2 weeks of use, however, pt will need to request future refills from his Endocrinologist.  Will you please order the  following at time of discharge, and the diabetes team will give pt a sample prior to d/c? Freestyle Libre Reader: Order # [id number] [facility] Sensor: Order # [id number]     --Will follow patient during hospitalization--  Wyn Quaker RN, MSN, CDE Diabetes Coordinator Inpatient Glycemic Control Team Team Pager: [phone] (8a-5p)

## 2021-09-09 NOTE — Progress Notes (Signed)
PT Cancellation Note  Patient Details Name: David [name] MRN: [id number] DOB: [birth year]   Cancelled Treatment:     PT attempt. Pt requesting author return at 1 pm. Will return per request and continue to follow per current POC.   Willette Pa 09/09/2021, 9:34 AM

## 2021-09-09 NOTE — Assessment & Plan Note (Signed)
--  Stable.  Seen by both infectious disease and orthopedics, status post arthroscopy.  Will continue on 2 weeks of cefazolin and follow-up with infectious disease and orthopedics as an outpatient. --Discussed with Dr. Sabra Heck, patient can weight-bear as tolerated, wear brace while ambulating.  Ambulate with a walker.  Follow-up will be arranged by Dr. Sabra Heck in the next 2 weeks.

## 2021-09-09 NOTE — Assessment & Plan Note (Signed)
--   Stable.  Follow-up as an outpatient.

## 2021-09-09 NOTE — Progress Notes (Signed)
Subjective: 2 Days Post-Op Procedure(s) (LRB): IRRIGATION AND DEBRIDEMENT OF LEFT KNEE; ARTHROSCOPY OF LEFT KNEE, PARTIAL MEDIAL MINISECTOMY. (Left)   Left knee is doing well.  Less pain.  His white count remains normal limits.  He remains afebrile.  Plans are to let him go to skilled nursing today with IV antibiotics for 2 week and then recheck.  We will let him weight-bear on the leg with a walker.  He is very unstable and falls frequently due to myelitis.  He did have surgery this summer at [facility].  I will see him back in 2 weeks in my office.  Patient reports pain as mild.  Objective:   VITALS:   Vitals:   09/09/21 0737 09/09/21 1159  BP: 118/75 109/68  Pulse: 62 61  Resp: 15 16  Temp: 98.2 F (36.8 C) 98.4 F (36.9 C)  SpO2: 100% 100%    Left knee swelling is down.  Wounds are healing.  Neurovascular status is good.  Skin is intact.  LABS Recent Labs    09/08/21 1008  HGB 8.6*  HCT 25.3*  WBC 7.9  PLT 124*    Recent Labs    09/07/21 0859 09/08/21 0650  CREATININE 1.24 1.41*    No results for input(s): LABPT, INR in the last 72 hours.   Assessment/Plan: 2 Days Post-Op Procedure(s) (LRB): IRRIGATION AND DEBRIDEMENT OF LEFT KNEE; ARTHROSCOPY OF LEFT KNEE, PARTIAL MEDIAL MINISECTOMY. (Left)   Continue IV antibiotics per medical service. PT may ambulate patient with a walker and knee immobilizer.  They may institute gentle range of motion as well. Recommend enteric-coated aspirin daily for DVT prophylaxis. Return to my office in 2 weeks for exam.

## 2021-09-09 NOTE — Discharge Summary (Addendum)
Physician Discharge Summary   Patient name: David [name]  Admit date:     09/05/2021  Discharge date: 09/10/2021  Attending Physician: Orene Desanctis [[id number]]  Discharge Physician: Murray Hodgkins   PCP: [name], MD    Contact information for follow-up providers     [name], MD. Schedule an appointment as soon as possible for a visit in 2 week(s).   Specialty: Infectious Diseases Why: [facility] WILL CALL AND MAKE APPT Contact information: [place] [address] [phone]         [name], MD. Schedule an appointment as soon as possible for a visit on 09/24/2021.   Specialty: Orthopedic Surgery Why: Please make an appointment for this patient in my office in 2 weeks prior to his discharge.  Thank you. [facility] Contact information: [place] [address] [phone]              Contact information for after-discharge care     Destination     [facility] Preferred SNF .   Service: Skilled Nursing Contact information: [address] [place] [phone]                     Recommendations at discharge:  Continue IV antibiotics for knee infection PT may ambulate patient with a walker and knee immobilizer.  They may institute gentle range of motion as well. Recommend enteric-coated aspirin daily for DVT prophylaxis. Return to Dr. Ammie Ferrier office in 2 weeks for exam. Creatinine slightly elevated, suggest recheck Friday 10/14, was 1.41 on 10/11. 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)   Thrombocytopenia (HCC)   Macrocytic anemia   Alcoholic cirrhosis of liver without ascites [facility])   [facility] Course    70 year old man PMH including alcoholic cirrhosis, diabetes, presented w/ knee pain, admitted for suspected left knee septic arthritis --10/9 orthopedics consult, conservative management --10/10 ID consult, fluid growing MRSA, left knee arthroscopy with irrigation and debridement for sepsis, 2)  arthroscopic partial medial meniscectomy --10/11 PICC line --10/12 stable for discharge    * Staphylococcal arthritis of left knee (HCC) --Stable.  Seen by both infectious disease and orthopedics, status post arthroscopy.  Will continue on 2 weeks of cefazolin and follow-up with infectious disease and orthopedics as an outpatient. --Discussed with Dr. Sabra Heck, patient can weight-bear as tolerated, wear brace while ambulating.  Ambulate with a walker.  Follow-up will be arranged by Dr. Sabra Heck in the next 2 weeks.  Pseudogout of knee, left -- Continue colchicine  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 ([name]) -- Stable here.  Continue long-acting insulin, meal coverage, recommend sliding scale insulin at nursing facility. -- Patient requested freestyle libre continuous glucose monitor, sample provided, can follow-up with endocrinologist as an outpatient  Fall --plan SNF  Alcoholic cirrhosis of liver without ascites ([name]) -- Stable.  Follow-up as an outpatient.  Macrocytic anemia --stable, likely related to alcohol use, poor nutrition, suggest check B12 and folate as outpatient  Thrombocytopenia (HCC) --stable, secondary to cirrhosis, follow-up as an outpatient   Procedures performed: as above   Condition at discharge: good  Exam Physical Exam Vitals reviewed.  Constitutional:      General: He is not in  acute distress.    Appearance: He is not ill-appearing or toxic-appearing.  Cardiovascular:     Rate and Rhythm: Normal rate and regular rhythm.     Heart sounds: No murmur heard. Pulmonary:     Effort: Pulmonary effort is normal. No respiratory distress.     Breath sounds: No  wheezing, rhonchi or rales.  Neurological:     Mental Status: He is alert.     Disposition: Skilled nursing facility  Discharge time: greater than 30 minutes. Allergies as of 09/10/2021   No Known Allergies      Medication List     STOP taking these medications    cephALEXin 500 MG capsule Commonly known as: KEFLEX   HYDROcodone-acetaminophen 10-325 MG tablet Commonly known as: NORCO       TAKE these medications    Alpha Lipoic Acid 200 MG Caps Take 1 capsule by mouth daily.   aspirin 81 MG EC tablet Take 1 tablet (81 mg total) by mouth daily. Swallow whole.   ceFAZolin  IVPB Commonly known as: ANCEF Inject 2 g into the vein every 8 (eight) hours for 12 days. Indication:  MSSA septic knee Last Day of Therapy:  09/21/2021 Labs - Once weekly:  CBC/D, CRP, and SCr FAX weekly labs to [phone]   Co Q 10 100 MG Caps Take 100 mg by mouth daily.   colchicine 0.6 MG tablet Take 1 tablet (0.6 mg total) by mouth 2 (two) times daily.   ferrous sulfate 325 (65 FE) MG tablet Take 325 mg by mouth daily with breakfast.   FreeStyle Libre 2 Reader Kerrin Mo Use as directed   [employer] 2 [employer] Use as directed   furosemide 20 MG tablet Commonly known as: LASIX Take 1 tablet by mouth once daily   glucose blood test strip Commonly known as: ONE TOUCH ULTRA TEST Use to test blood sugar 2 times daily as instructed. Dx: E11.59, E11.65   insulin NPH Human 100 UNIT/ML injection Commonly known as: NovoLIN N ReliOn Inject under skin 30 units in a.m. and 35-40 units at bedtime   Insulin Pen Needle 32G X 4 MM Misc Use 4x a day - with insulin   losartan 50 MG tablet Commonly known as: COZAAR Take 1 tablet (50 mg total) by mouth daily. Please make yearly appt with Dr. Burt Knack for October 2022 for future refills. Thank you 1st attempt   metFORMIN 1000 MG tablet Commonly known as: GLUCOPHAGE TAKE 1 TABLET BY MOUTH TWICE DAILY WITH MEALS   multivitamin with  minerals Tabs tablet Take 1 tablet by mouth at bedtime.   OneTouch Delica Lancets Fine Misc Use to test blood sugar 2 times daily as instructed. Dx: E11.59, E11.65   OVER THE COUNTER MEDICATION Take 2 capsules by mouth daily. Liver Support   oxyCODONE-acetaminophen 5-325 MG tablet Commonly known as: Percocet Take 1 tablet by mouth every 6 (six) hours as needed for up to 5 days for severe pain or moderate pain. What changed:  when to take this reasons to take this   pantoprazole 40 MG tablet Commonly known as: PROTONIX Take 1 tablet by mouth twice daily   PARoxetine 40 MG tablet Commonly known as: PAXIL Take 1 tablet (40 mg total) by mouth every morning.   propranolol 80 MG tablet Commonly known as: INDERAL Take 1 tablet (80 mg total) by mouth 2 (two) times daily.   ReliOn Insulin Syringe 31G X 15/64" 0.5 ML Misc Generic drug: Insulin Syringe-Needle U-100 USE  TWICE  DAILY   rosuvastatin 10 MG tablet Commonly known as: CRESTOR Take 1 tablet by mouth once daily   sildenafil 100 MG tablet Commonly known as: VIAGRA TAKE 1 TABLET BY MOUTH ONCE DAILY AS NEEDED FOR ERECTILE DYSFUNCTION   spironolactone 50 MG tablet Commonly known as: ALDACTONE Take 1 tablet by mouth once daily   Systane 0.4-0.3 % Soln Generic drug: Polyethyl Glycol-Propyl Glycol Place 2 drops into both eyes daily.   triamcinolone cream 0.1 % Commonly known as: KENALOG APPLY TOPICALLY TO SCALP TWO TIMES DAILY AS NEEDED   vitamin C 1000 MG tablet Take 1,000 mg by mouth daily.               Home Infusion Instuctions  (From admission, onward)           Start     Ordered   09/09/21 0000  Home infusion instructions       Question:  Instructions  Answer:  Flushing of vascular access device: 0.9% NaCl pre/post medication administration and prn patency; Heparin 100 u/ml, 80ml for implanted ports and Heparin 10u/ml, 66ml for all other central venous catheters.   09/09/21 1435               Discharge Care Instructions  (From admission, onward)           Start     Ordered   09/09/21 0000  Discharge wound care:       Comments: As per Dr. Ammie Ferrier office.   09/09/21 1435            DG Elbow Complete Right  Result Date: 09/05/2021 CLINICAL DATA:  70 year old male status post falls. Pain. EXAM: RIGHT ELBOW - COMPLETE 3+ VIEW COMPARISON:  None. FINDINGS: No definite joint effusion. Preserved alignment at the elbow. Joint spaces are normal for age. Mild degenerative spurring. Bone mineralization is within normal limits. Radial head appears intact. No acute osseous abnormality identified. No discrete soft tissue injury. IMPRESSION: No acute fracture or dislocation identified about the right elbow. Electronically Signed   By: Genevie Ann M.D.   On: 09/05/2021 06:36   DG Knee Complete 4 Views Left  Result Date: 09/05/2021 CLINICAL DATA:  70 year old male status post falls. Pain. EXAM: LEFT KNEE - COMPLETE 4+ VIEW COMPARISON:  No prior left knee series. FINDINGS: No evidence of joint effusion. Intact patella. Preserved alignment. Mild medial and lateral compartment joint degeneration, mild medial compartment joint space loss. No acute osseous abnormality identified. Small dystrophic calcification posterior to the knee. No discrete soft tissue injury. IMPRESSION: No acute fracture or dislocation identified about the left knee. Electronically Signed   By: Genevie Ann M.D.   On: 09/05/2021 06:37   DG Knee Complete 4 Views Right  Result Date: 09/05/2021 CLINICAL DATA:  70 year old male status post falls.  Pain. EXAM: RIGHT KNEE - COMPLETE 4+ VIEW COMPARISON:  Knee series 11/05/2013. FINDINGS: Anterior soft tissue swelling. No evidence of joint effusion. Intact patella. Preserved alignment. Chronic lateral compartment degenerative spurring. Mild joint space loss. No acute osseous abnormality identified. IMPRESSION: Anterior soft tissue swelling. No acute fracture or dislocation identified about  the right knee. Electronically Signed   By: Genevie Ann M.D.   On: 09/05/2021 06:35   DG Hip Unilat W or Wo Pelvis 2-3 Views Right  Result Date: 09/05/2021 CLINICAL DATA:  70 year old male status post fall.  Pain. EXAM: DG HIP (WITH OR WITHOUT PELVIS) 2-3V RIGHT COMPARISON:  Pelvis radiograph 08/20/2019. FINDINGS: Pelvis appears stable and intact. Femoral  heads remain normally located. Stable proximal left femur. Proximal right femur appears intact. No acute osseous abnormality identified. Small chronic dystrophic calcifications about the right hip. Negative visible lower abdominal and pelvic visceral contours. IMPRESSION: No acute fracture or dislocation identified about the right hip or pelvis. Electronically Signed   By: Genevie Ann M.D.   On: 09/05/2021 08:49   [place] EKG SITE RITE  Result Date: 09/08/2021 If Site Rite image not attached, placement could not be confirmed due to current cardiac rhythm.   Results for orders placed or performed during the hospital encounter of 09/05/21  Body fluid culture w Gram Stain     Status: None   Collection Time: 09/05/21 10:16 PM   Specimen: KNEE; Body Fluid  Result Value Ref Range Status   Specimen Description   Final    KNEE Performed at [facility], [address], [place], [place] [address]    Special Requests   Final    NONE Performed at [facility], [address] [place], [place] [address]    Gram Stain   Final    NO SQUAMOUS EPITHELIAL CELLS SEEN MODERATE WBC PRESENT, PREDOMINANTLY PMN NO ORGANISMS SEEN    Culture   Final    RARE STAPHYLOCOCCUS AUREUS CRITICAL RESULT CALLED TO, READ BACK BY AND VERIFIED WITH: RN L.SHIFFLETT AT 1244 ON 09/07/2021 BY T.SAAD. Performed at [facility], [place] [address], [place], [place] [address]    Report Status 09/08/2021 FINAL  Final   Organism ID, Bacteria STAPHYLOCOCCUS AUREUS  Final      Susceptibility   Staphylococcus aureus - MIC*    CIPROFLOXACIN <=0.5 SENSITIVE Sensitive      ERYTHROMYCIN <=0.25 SENSITIVE Sensitive     GENTAMICIN <=0.5 SENSITIVE Sensitive     OXACILLIN <=0.25 SENSITIVE Sensitive     TETRACYCLINE <=1 SENSITIVE Sensitive     VANCOMYCIN 1 SENSITIVE Sensitive     TRIMETH/SULFA <=10 SENSITIVE Sensitive     CLINDAMYCIN <=0.25 SENSITIVE Sensitive     RIFAMPIN <=0.5 SENSITIVE Sensitive     Inducible Clindamycin NEGATIVE Sensitive     * RARE STAPHYLOCOCCUS AUREUS  Resp Panel by RT-PCR (Flu A&B, Covid) Nasopharyngeal Swab     Status: None   Collection Time: 09/06/21  2:27 AM   Specimen: Nasopharyngeal Swab; Nasopharyngeal(NP) swabs in vial transport medium  Result Value Ref Range Status   SARS Coronavirus 2 by RT PCR NEGATIVE NEGATIVE Final    Comment: (NOTE) SARS-CoV-2 target nucleic acids are NOT DETECTED.  The SARS-CoV-2 RNA is generally detectable in upper respiratory specimens during the acute phase of infection. The lowest concentration of SARS-CoV-2 viral copies this assay can detect is 138 copies/mL. A negative result does not preclude SARS-Cov-2 infection and should not be used as the sole basis for treatment or other patient management decisions. A negative result may occur with  improper specimen collection/handling, submission of specimen other than nasopharyngeal swab, presence of viral mutation(s) within the areas targeted by this assay, and inadequate number of viral copies(<138 copies/mL). A negative result must be combined with clinical observations, patient history, and epidemiological information. The expected result is Negative.  Fact Sheet for Patients:  EntrepreneurPulse.com.au  Fact Sheet for Healthcare Providers:  IncredibleEmployment.be  This test is no t yet approved or cleared by the [place] FDA and  has been authorized for detection and/or diagnosis of SARS-CoV-2 by FDA under an Emergency Use Authorization (EUA). This EUA will remain  in effect (meaning this test can be  used) for the duration of the COVID-19 declaration under Section 564(b)(1) of the Act, 21 U.S.C.section 360bbb-3(b)(1), unless the authorization is terminated  or revoked sooner.       Influenza A by PCR NEGATIVE NEGATIVE Final   Influenza B by PCR NEGATIVE NEGATIVE Final    Comment: (NOTE) The Xpert Xpress SARS-CoV-2/FLU/RSV plus assay is intended as an aid in the diagnosis of influenza from Nasopharyngeal swab specimens and should not be used as a sole basis for treatment. Nasal washings and aspirates are unacceptable for Xpert Xpress SARS-CoV-2/FLU/RSV testing.  Fact Sheet for Patients: EntrepreneurPulse.com.au  Fact Sheet for Healthcare Providers: IncredibleEmployment.be  This test is not yet approved or cleared by the [place] FDA and has been authorized for detection and/or diagnosis of SARS-CoV-2 by FDA under an Emergency Use Authorization (EUA). This EUA will remain in effect (meaning this test can be used) for the duration of the COVID-19 declaration under Section 564(b)(1) of the Act, 21 U.S.C. section 360bbb-3(b)(1), unless the authorization is terminated or revoked.  Performed at [facility], [address] [place], [place] [address]   Resp Panel by RT-PCR (Flu A&B, Covid) Nasopharyngeal Swab     Status: None   Collection Time: 09/09/21  3:17 PM   Specimen: Nasopharyngeal Swab; Nasopharyngeal(NP) swabs in vial transport medium  Result Value Ref Range Status   SARS Coronavirus 2 by RT PCR NEGATIVE NEGATIVE Final    Comment: (NOTE) SARS-CoV-2 target nucleic acids are NOT DETECTED.  The SARS-CoV-2 RNA is generally detectable in upper respiratory specimens during the acute phase of infection. The lowest concentration of SARS-CoV-2 viral copies this assay can detect is 138 copies/mL. A negative result does not preclude SARS-Cov-2 infection and should not be used as the sole basis for treatment or other patient  management decisions. A negative result may occur with  improper specimen collection/handling, submission of specimen other than nasopharyngeal swab, presence of viral mutation(s) within the areas targeted by this assay, and inadequate number of viral copies(<138 copies/mL). A negative result must be combined with clinical observations, patient history, and epidemiological information. The expected result is Negative.  Fact Sheet for Patients:  EntrepreneurPulse.com.au  Fact Sheet for Healthcare Providers:  IncredibleEmployment.be  This test is no t yet approved or cleared by the [place] FDA and  has been authorized for detection and/or diagnosis of SARS-CoV-2 by FDA under an Emergency Use Authorization (EUA). This EUA will remain  in effect (meaning this test can be used) for the duration of the COVID-19 declaration under Section 564(b)(1) of the Act, 21 U.S.C.section 360bbb-3(b)(1), unless the authorization is terminated  or revoked sooner.       Influenza A by PCR NEGATIVE NEGATIVE Final   Influenza B by PCR NEGATIVE NEGATIVE Final    Comment: (NOTE) The Xpert Xpress SARS-CoV-2/FLU/RSV plus assay is intended as an aid in the diagnosis of influenza from Nasopharyngeal swab specimens and should not be used as a sole basis for treatment. Nasal washings and aspirates are unacceptable for Xpert Xpress SARS-CoV-2/FLU/RSV testing.  Fact Sheet for Patients: EntrepreneurPulse.com.au  Fact Sheet for Healthcare Providers: IncredibleEmployment.be  This test is not yet approved or cleared by the [place] FDA and has been authorized for detection and/or diagnosis of SARS-CoV-2 by FDA under an Emergency Use Authorization (EUA). This EUA will remain in effect (meaning this test can be used) for the duration of the COVID-19 declaration under Section 564(b)(1) of the Act, 21 U.S.C. section 360bbb-3(b)(1),  unless the authorization is terminated  or revoked.  Performed at [facility], [address] [place], [place] [address]     Signed:  Murray Hodgkins MD.  [facility] 09/10/2021, 9:26 AM

## 2021-09-09 NOTE — Care Management Important Message (Signed)
Important Message  Patient Details  Name: David [name] MRN: [id number] Date of Birth: May 05, 1951   Medicare Important Message Given:  Yes (reviewed medicare rights by telephone)     Tommy Medal 09/09/2021, 12:13 PM

## 2021-09-09 NOTE — TOC Progression Note (Signed)
Transition of Care [facility]) - Progression Note    Patient Details  Name: David [name] MRN: [id number] Date of Birth: 1951/11/29  Transition of Care [facility]) CM/SW [place], RN Phone Number: 09/09/2021, 11:27 AM  Clinical Narrative:   uploaded all clinical documents and progress notes to [place] must to Obtain PASSR    Expected Discharge Plan: Skilled Nursing Facility Barriers to Discharge: [place] (PASRR), Insurance Authorization, Continued Medical Work up  Expected Discharge Plan and Services Expected Discharge Plan: [place] Choice: [place] arrangements for the past 2 months: Apartment                                       Social Determinants of Health (SDOH) Interventions    Readmission Risk Interventions No flowsheet data found.

## 2021-09-09 NOTE — Hospital Course (Addendum)
70 year old man PMH including alcoholic cirrhosis, diabetes, presented w/ knee pain, admitted for suspected left knee septic arthritis --10/9 orthopedics consult, conservative management --10/10 ID consult, fluid growing MRSA, left knee arthroscopy with irrigation and debridement for sepsis, 2)  arthroscopic partial medial meniscectomy --10/11 PICC line --10/12 stable for discharge --10/13 remains stable for discharge to SNF

## 2021-09-09 NOTE — Anesthesia Postprocedure Evaluation (Signed)
Anesthesia Post Note  Patient: David [name]  Procedure(s) Performed: IRRIGATION AND DEBRIDEMENT OF LEFT KNEE; ARTHROSCOPY OF LEFT KNEE, PARTIAL MEDIAL MINISECTOMY. (Left: Knee)  Patient location during evaluation: PACU Anesthesia Type: General Level of consciousness: awake and alert Pain management: pain level controlled Vital Signs Assessment: post-procedure vital signs reviewed and stable Respiratory status: spontaneous breathing, nonlabored ventilation, respiratory function stable and patient connected to nasal cannula oxygen Cardiovascular status: blood pressure returned to baseline and stable Postop Assessment: no apparent nausea or vomiting Anesthetic complications: no   No notable events documented.   Last Vitals:  Vitals:   09/09/21 0441 09/09/21 0737  BP: 110/71 118/75  Pulse: 63 62  Resp: 20 15  Temp: 37.1 C 36.8 C  SpO2: 100% 100%    Last Pain:  Vitals:   09/09/21 0436  TempSrc:   PainSc: David [name]

## 2021-09-10 DIAGNOSIS — R2681 Unsteadiness on feet: Secondary | ICD-10-CM | POA: Diagnosis not present

## 2021-09-10 DIAGNOSIS — N1831 Chronic kidney disease, stage 3a: Secondary | ICD-10-CM | POA: Diagnosis not present

## 2021-09-10 DIAGNOSIS — D696 Thrombocytopenia, unspecified: Secondary | ICD-10-CM | POA: Diagnosis not present

## 2021-09-10 DIAGNOSIS — E162 Hypoglycemia, unspecified: Secondary | ICD-10-CM | POA: Diagnosis not present

## 2021-09-10 DIAGNOSIS — M6281 Muscle weakness (generalized): Secondary | ICD-10-CM | POA: Diagnosis not present

## 2021-09-10 DIAGNOSIS — L89012 Pressure ulcer of right elbow, stage 2: Secondary | ICD-10-CM | POA: Diagnosis not present

## 2021-09-10 DIAGNOSIS — E1122 Type 2 diabetes mellitus with diabetic chronic kidney disease: Secondary | ICD-10-CM | POA: Diagnosis not present

## 2021-09-10 DIAGNOSIS — Z792 Long term (current) use of antibiotics: Secondary | ICD-10-CM | POA: Diagnosis not present

## 2021-09-10 DIAGNOSIS — Z7401 Bed confinement status: Secondary | ICD-10-CM | POA: Diagnosis not present

## 2021-09-10 DIAGNOSIS — Z4789 Encounter for other orthopedic aftercare: Secondary | ICD-10-CM | POA: Diagnosis not present

## 2021-09-10 DIAGNOSIS — I10 Essential (primary) hypertension: Secondary | ICD-10-CM | POA: Diagnosis not present

## 2021-09-10 DIAGNOSIS — E119 Type 2 diabetes mellitus without complications: Secondary | ICD-10-CM | POA: Diagnosis not present

## 2021-09-10 DIAGNOSIS — D539 Nutritional anemia, unspecified: Secondary | ICD-10-CM | POA: Diagnosis not present

## 2021-09-10 DIAGNOSIS — R41841 Cognitive communication deficit: Secondary | ICD-10-CM | POA: Diagnosis not present

## 2021-09-10 DIAGNOSIS — E441 Mild protein-calorie malnutrition: Secondary | ICD-10-CM | POA: Diagnosis not present

## 2021-09-10 DIAGNOSIS — G4730 Sleep apnea, unspecified: Secondary | ICD-10-CM | POA: Diagnosis not present

## 2021-09-10 DIAGNOSIS — R262 Difficulty in walking, not elsewhere classified: Secondary | ICD-10-CM | POA: Diagnosis not present

## 2021-09-10 DIAGNOSIS — M00062 Staphylococcal arthritis, left knee: Secondary | ICD-10-CM | POA: Diagnosis not present

## 2021-09-10 DIAGNOSIS — M11262 Other chondrocalcinosis, left knee: Secondary | ICD-10-CM | POA: Diagnosis not present

## 2021-09-10 DIAGNOSIS — M25562 Pain in left knee: Secondary | ICD-10-CM | POA: Diagnosis not present

## 2021-09-10 DIAGNOSIS — K7030 Alcoholic cirrhosis of liver without ascites: Secondary | ICD-10-CM | POA: Diagnosis not present

## 2021-09-10 DIAGNOSIS — E1159 Type 2 diabetes mellitus with other circulatory complications: Secondary | ICD-10-CM | POA: Diagnosis not present

## 2021-09-10 DIAGNOSIS — R531 Weakness: Secondary | ICD-10-CM | POA: Diagnosis not present

## 2021-09-10 LAB — BASIC METABOLIC PANEL
Anion gap: 5 (ref 5–15)
BUN: 31 mg/dL — ABNORMAL HIGH (ref 8–23)
CO2: 25 mmol/L (ref 22–32)
Calcium: 8 mg/dL — ABNORMAL LOW (ref 8.9–10.3)
Chloride: 101 mmol/L (ref 98–111)
Creatinine, Ser: 1.18 mg/dL (ref 0.61–1.24)
GFR, Estimated: >60 mL/min (ref >60)
Glucose, Bld: 122 mg/dL — ABNORMAL HIGH (ref 70–99)
Potassium: 4 mmol/L (ref 3.5–5.1)
Sodium: 131 mmol/L — ABNORMAL LOW (ref 135–145)

## 2021-09-10 LAB — GLUCOSE, CAPILLARY
Glucose-Capillary: 121 mg/dL — ABNORMAL HIGH (ref 70–99)
Glucose-Capillary: 86 mg/dL (ref 70–99)

## 2021-09-10 NOTE — TOC Progression Note (Signed)
Transition of Care [facility]) - Progression Note    Patient Details  Name: David [name] MRN: [id number] Date of Birth: 07-Nov-1951  Transition of Care [facility]) CM/SW [place], RN Phone Number: 09/10/2021, 9:21 AM  Clinical Narrative:   the patient to DC today to [facility] room 304 A, the bedside nurse to call report to [phone], patient will Transport with EMS    Expected Discharge Plan: [place] Barriers to Discharge: [place] (PASRR), Insurance Authorization, Continued Medical Work up  Expected Discharge Plan and Services Expected Discharge Plan: [place] Choice: [place] arrangements for the past 2 months: Apartment Expected Discharge Date: 09/09/21                                     Social Determinants of Health (SDOH) Interventions    Readmission Risk Interventions No flowsheet data found.

## 2021-09-10 NOTE — Progress Notes (Signed)
Went by to assist pt with setting up the [place] 2 reader and apply the sensor prior to d/c to the Rehab center today.  Pt told me he was too tired to do anything with the Freestyle today and did not want to start the sensor here in the hospital.  Stated he never read the material that was given to him several days ago by the diabetes RN and didn't feel like doing it right now.  Stated he didn't even know if he had the energy to get out of bed.    Left the Reader and sensor with pt and placed in his bag of belongings with his permission.  Dr. Sarajane Jews has placed orders for both the reader and sensor.  Strongly encouraged pt to watch the videos on the internet about application and use of the reader and sensor and also encouraged pt to read through all the written materials provided.    --Will follow patient during hospitalization--  Wyn Quaker RN, MSN, CDE Diabetes Coordinator Inpatient Glycemic Control Team Team Pager: [phone] (8a-5p)

## 2021-09-10 NOTE — Assessment & Plan Note (Signed)
--  plan SNF

## 2021-09-10 NOTE — Progress Notes (Signed)
  Progress Note    David [name]   VPX:[id number]  DOB: [birth year]  DOA: 09/05/2021     4 Date of Service: 09/10/2021   70 year old man PMH including alcoholic cirrhosis, diabetes, presented w/ knee pain, admitted for suspected left knee septic arthritis --10/9 orthopedics consult, conservative management --10/10 ID consult, fluid growing MRSA, left knee arthroscopy with irrigation and debridement for sepsis, 2)  arthroscopic partial medial meniscectomy --10/11 PICC line --10/12 stable for discharge --10/13 remains stable for discharge to SNF  Subjective:  Knee is sore today, slept poorly, but doing ok.  Hospital Problems * Staphylococcal arthritis of left knee (HCC) --Stable.  Seen by both infectious disease and orthopedics, status post arthroscopy.  Will continue on 2 weeks of cefazolin and follow-up with infectious disease and orthopedics as an outpatient. --Patient can weight-bear as tolerated, wear brace while ambulating.  Ambulate with a walker.  Follow-up will be arranged by Dr. Sabra Heck in the next 2 weeks.  Pseudogout of knee, left -- Continue colchicine  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 ([place]) -- Stable today.  Continue long-acting insulin, recommend sliding scale insulin at nursing facility. -- Patient requested freestyle libre continuous glucose monitor, sample provided, can follow-up with endocrinologist as an outpatient --[place] Reader: Order # [id number], [place] Night Sensor: Order # [id number]  Fall --plan SNF  Alcoholic cirrhosis of liver without ascites ([place]) -- Stable.  Follow-up as an outpatient.  Macrocytic anemia --stable, likely related to alcohol use, poor nutrition, suggest check B12 and folate as outpatient  Thrombocytopenia (HCC) --stable, secondary to cirrhosis, follow-up as an outpatient     Objective Vital signs were reviewed and unremarkable.  Vitals:   09/09/21 1629 09/09/21 1909 09/10/21 0540 09/10/21 0758  BP: 121/71  107/68 117/65 110/67  Pulse: 61 62 63 65  Resp: 15 18 20 16   Temp: 98.3 F (36.8 C) 98.5 F (36.9 C) 98.8 F (37.1 C) 98.8 F (37.1 C)  TempSrc:      SpO2: 100% 99% 90% 98%  Weight:      Height:       77.2 kg  Exam Physical Exam Constitutional:      General: He is not in acute distress. Cardiovascular:     Rate and Rhythm: Normal rate.     Heart sounds: No murmur heard. Pulmonary:     Effort: Pulmonary effort is normal. No respiratory distress.  Neurological:     Mental Status: He is alert.  Psychiatric:        Mood and Affect: Mood normal.        Behavior: Behavior normal.     Labs / Other Information My review of labs, imaging, notes and other tests is significant for   creatinine normalized 1.18.  BUN trending down.    Time spent: 20 minutes [facility] 09/10/2021, 9:31 AM

## 2021-09-10 NOTE — Progress Notes (Signed)
Carollee Herter LPN at [facility] took report on this patient

## 2021-09-10 NOTE — Assessment & Plan Note (Signed)
--  stable, secondary to cirrhosis, follow-up as an outpatient

## 2021-09-10 NOTE — Assessment & Plan Note (Signed)
Continue colchicine °

## 2021-09-10 NOTE — Assessment & Plan Note (Signed)
--  stable, likely related to alcohol use, poor nutrition, suggest check B12 and folate as outpatient

## 2021-09-10 NOTE — Assessment & Plan Note (Addendum)
--  Stable.  Seen by both infectious disease and orthopedics, status post arthroscopy.  Will continue on 2 weeks of cefazolin and follow-up with infectious disease and orthopedics as an outpatient. --Patient can weight-bear as tolerated, wear brace while ambulating.  Ambulate with a walker.  Follow-up will be arranged by Dr. Sabra Heck in the next 2 weeks.

## 2021-09-10 NOTE — Assessment & Plan Note (Signed)
--   Stable.  Follow-up as an outpatient.

## 2021-09-10 NOTE — Assessment & Plan Note (Signed)
--   Stable today.  Continue long-acting insulin, recommend sliding scale insulin at nursing facility. -- Patient requested freestyle libre continuous glucose monitor, sample provided, can follow-up with endocrinologist as an outpatient --[place] Reader: Order # [id number], [place] Night Sensor: Order # [id number]

## 2021-09-11 ENCOUNTER — Encounter: Payer: Self-pay | Admitting: Adult Health

## 2021-09-11 ENCOUNTER — Non-Acute Institutional Stay (SKILLED_NURSING_FACILITY): Payer: Medicare Other | Admitting: Adult Health

## 2021-09-11 DIAGNOSIS — M00062 Staphylococcal arthritis, left knee: Secondary | ICD-10-CM

## 2021-09-11 DIAGNOSIS — R197 Diarrhea, unspecified: Secondary | ICD-10-CM

## 2021-09-11 DIAGNOSIS — K7030 Alcoholic cirrhosis of liver without ascites: Secondary | ICD-10-CM | POA: Diagnosis not present

## 2021-09-11 DIAGNOSIS — D539 Nutritional anemia, unspecified: Secondary | ICD-10-CM

## 2021-09-11 DIAGNOSIS — E119 Type 2 diabetes mellitus without complications: Secondary | ICD-10-CM

## 2021-09-11 DIAGNOSIS — M11262 Other chondrocalcinosis, left knee: Secondary | ICD-10-CM

## 2021-09-11 DIAGNOSIS — D696 Thrombocytopenia, unspecified: Secondary | ICD-10-CM

## 2021-09-11 DIAGNOSIS — Z794 Long term (current) use of insulin: Secondary | ICD-10-CM

## 2021-09-11 LAB — BODY FLUID CULTURE W GRAM STAIN

## 2021-09-11 NOTE — Progress Notes (Signed)
Location:  [facility] Room Number: 956-O Place of Service:  SNF (31) Provider:  Durenda Age, DNP, FNP-BC  Patient Care Team: [name], MD as PCP - Cyndia Diver, MD as PCP - Cardiology (Cardiology) Debbora Dus, [facility] as Pharmacist (Pharmacist)  Extended Emergency Contact Information Primary Emergency Contact: [place] Phone: [phone] Relation: Brother  Code Status: Full code   Goals of care: Advanced Directive information Advanced Directives 09/11/2021  Does Patient Have a Medical Advance Directive? No  Would patient like information on creating a medical advance directive? No - Patient declined     Chief Complaint  Patient presents with   Hospitalization Follow-up    HPI:  Pt is a 70 y.o. male who was admitted to [facility] on  09/10/21 post hospital admission 09/05/21 to 09/10/2021.  He has a PMH including alcoholic cirrhosis, insulin-dependent type 2 diabetes mellitus, hypertension, CAD, hyperlipidemia and depression. He presented to the ED on 10/8 with knee and hip pain following a fall at home.  X-rays were negative and was set and Keflex for cellulitis of the right knee.  He then reported having another fall at home and presented to the ED again.  He reported having recurrent weekly falls since about 8 months ago due to weakness of his legs and neuropathy.  He had a recent microdiscectomy back in July.  In the ED, right knee x-ray showed anterior soft tissue swelling, negative for left knee fracture.  There was significant edema of the left knee so aspiration was done in the ED showing calcium pyrophosphate crystals and significantly elevated WBC of > 140,000 concerning for septic arthritis.  He was admitted to the hospital for suspected left knee septic arthritis. On 10/10, infectious disease was consulted due to fluid left knee growing MRSA, 1) Left knee arthroscopy with irrigation and  debridement for sepsis was done, and 2) arthroscopic partial medial meniscectomy was performed. PICC line was inserted on 09/08/21.  Cefazolin IV was started on 09/09/2021.  He is weightbearing as tolerated on LLE and to continue to wear brace while ambulating with a walker.  He will follow-up with orthopedics, Dr. Sabra Heck, in 2 weeks.  He was seen in his room today and complained of diarrhea.  He denies having abdominal pain.  He is currently taking colchicine 0.6 mg twice a day for gout.  He stated that he used to drink 20 beers a week and has cut down to 10-15/week.   Past Medical History:  Diagnosis Date   Allergy    Anemia    Arthritis    Bilateral hands and feet   CAD (coronary artery disease)    s/p bifurcation stenting (DES x2) and stenting bifurcation lesion distal RCA (1 DES)   Cataract    Cirrhosis (HCC)    Depression    Diabetes mellitus    Esophageal varices (HCC)    GERD (gastroesophageal reflux disease)    Hyperlipemia    Hypertension    Myocardial infarction ([place])    Sleep apnea    CPAP   Past Surgical History:  Procedure Laterality Date   BIOPSY  09/01/2018   Procedure: BIOPSY;  Surgeon: Mauri Pole, MD;  Location: [facility];  Service: Endoscopy;;   CATARACT EXTRACTION Bilateral    CORONARY ANGIOPLASTY WITH STENT PLACEMENT   March 2010   Dr. Olevia Perches   ESOPHAGOGASTRODUODENOSCOPY (EGD) WITH PROPOFOL N/A 09/01/2018   Procedure: ESOPHAGOGASTRODUODENOSCOPY (EGD) WITH PROPOFOL;  Surgeon: Mauri Pole, MD;  Location: [facility];  Service: Endoscopy;  Laterality: N/A;   KNEE ARTHROSCOPY Left 09/07/2021   Procedure: IRRIGATION AND DEBRIDEMENT OF LEFT KNEE; ARTHROSCOPY OF LEFT KNEE, PARTIAL MEDIAL [place].;  Surgeon: [name], MD;  Location: [facility] ORS;  Service: Orthopedics;  Laterality: Left;   LEFT HEART CATH AND CORONARY ANGIOGRAPHY N/A 08/31/2018   Procedure: LEFT HEART CATH AND CORONARY ANGIOGRAPHY;  Surgeon: Sherren Mocha, MD;  Location: [facility];  Service: Cardiovascular;  Laterality: N/A;   THORACIC DISCECTOMY Right 06/19/2021   Procedure: Microdiscectomy - right - Thoracic Eleven-Thoracic Twelve;  Surgeon: Earnie Larsson, MD;  Location: [facility];  Service: Neurosurgery;  Laterality: Right;   TONSILLECTOMY  1959    No Known Allergies  Outpatient Encounter Medications as of 09/11/2021  Medication Sig   Alpha Lipoic Acid 200 MG CAPS Take 1 capsule by mouth daily.   Ascorbic Acid (VITAMIN C) 1000 MG tablet Take 1,000 mg by mouth daily.   aspirin EC 81 MG EC tablet Take 1 tablet (81 mg total) by mouth daily. Swallow whole.   bisacodyl (DULCOLAX) 10 MG suppository as needed. If not relieved by MOM, give 10 mg Bisacodyl suppositiory rectally X 1 dose in 24 hours as needed   ceFAZolin (ANCEF) IVPB Inject 2 g into the vein every 8 (eight) hours for 12 days. Indication:  MSSA septic knee Last Day of Therapy:  09/21/2021 Labs - Once weekly:  CBC/D, CRP, and SCr FAX weekly labs to [phone]   colchicine 0.6 MG tablet Take 1 tablet (0.6 mg total) by mouth 2 (two) times daily.   feeding supplement, GLUCERNA SHAKE, (GLUCERNA SHAKE) LIQD Take 237 mLs by mouth daily.   ferrous sulfate 325 (65 FE) MG tablet Take 325 mg by mouth daily with breakfast.   furosemide (LASIX) 20 MG tablet Take 1 tablet by mouth once daily   insulin NPH Human (NOVOLIN N RELION) 100 UNIT/ML injection Inject under skin 30 units in a.m. and 35-40 units at bedtime   losartan (COZAAR) 50 MG tablet Take 1 tablet (50 mg total) by mouth daily. Please make yearly appt with Dr. Burt Knack for October 2022 for future refills. Thank you 1st attempt   magnesium hydroxide (MILK OF MAGNESIA) 400 MG/5ML suspension If no BM in 3 days, give 30 cc Milk of Magnesium p.o. x 1 dose in 24 hours as needed (Do not use standing constipation orders for residents with renal failure CFR less than 30. Contact MD for orders) (Physician Order)   metFORMIN (GLUCOPHAGE) 1000 MG tablet TAKE 1  TABLET BY MOUTH TWICE DAILY WITH MEALS   metoprolol tartrate (LOPRESSOR) 50 MG tablet Take 50 mg by mouth 2 (two) times daily.   Multiple Vitamin (MULTIVITAMIN WITH MINERALS) TABS tablet Take 1 tablet by mouth at bedtime.   oxyCODONE-acetaminophen (PERCOCET) 5-325 MG tablet Take 1 tablet by mouth every 6 (six) hours as needed for up to 5 days for severe pain or moderate pain.   pantoprazole (PROTONIX) 40 MG tablet Take 1 tablet by mouth twice daily   PARoxetine (PAXIL) 40 MG tablet Take 1 tablet (40 mg total) by mouth every morning.   Polyethyl Glycol-Propyl Glycol (SYSTANE) 0.4-0.3 % SOLN Place 2 drops into both eyes daily.   rosuvastatin (CRESTOR) 10 MG tablet Take 1 tablet by mouth once daily   Sodium Phosphates (RA SALINE ENEMA RE) Place rectally. If not relieved by Biscodyl suppository, give disposable Saline Enema rectally X 1 dose/24 hrs as needed   spironolactone (ALDACTONE) 50 MG tablet  Take 1 tablet by mouth once daily   triamcinolone cream (KENALOG) 0.1 % APPLY TOPICALLY TO SCALP TWO TIMES DAILY AS NEEDED   Coenzyme Q10 (CO Q 10) 100 MG CAPS Take 100 mg by mouth daily.   Continuous Blood Gluc Receiver (FREESTYLE LIBRE 2 READER) DEVI Use as directed   Continuous Blood Gluc Sensor (FREESTYLE LIBRE 2 SENSOR) MISC Use as directed   glucose blood (ONE TOUCH ULTRA TEST) test strip Use to test blood sugar 2 times daily as instructed. Dx: E11.59, E11.65   Insulin Pen Needle 32G X 4 MM MISC Use 4x a day - with insulin   Insulin Syringe-Needle U-100 (RELION INSULIN SYRINGE) 31G X 15/64" 0.5 ML MISC USE  TWICE DAILY   ONETOUCH DELICA LANCETS FINE MISC Use to test blood sugar 2 times daily as instructed. Dx: E11.59, E11.65   OVER THE COUNTER MEDICATION Take 2 capsules by mouth daily. Liver Support   propranolol (INDERAL) 80 MG tablet Take 1 tablet (80 mg total) by mouth 2 (two) times daily.   sildenafil (VIAGRA) 100 MG tablet TAKE 1 TABLET BY MOUTH ONCE DAILY AS NEEDED FOR ERECTILE DYSFUNCTION    No facility-administered encounter medications on file as of 09/11/2021.    Review of Systems  GENERAL: No change in appetite, no fatigue, no weight changes, no fever or chills  MOUTH and THROAT: Denies oral discomfort, gingival pain or bleeding RESPIRATORY: no cough, SOB, DOE, wheezing, hemoptysis CARDIAC: No chest pain, edema or palpitations GI: + Diarrhea, denies abdominal pain GU: Denies dysuria, frequency, hematuria, incontinence, or discharge NEUROLOGICAL: Denies dizziness, syncope, numbness, or headache PSYCHIATRIC: Denies feelings of depression or anxiety. No report of hallucinations, insomnia, paranoia, or agitation    Immunization History  Administered Date(s) Administered   Fluad Quad(high Dose 65+) 08/07/2019, 10/02/2020, 08/21/2021   Hep A / Hep B 04/02/2020, 04/09/2020, 05/01/2020   Influenza Split 08/30/2011, 09/18/2012   Influenza Whole 08/21/2008, 10/02/2009, 08/18/2010   Influenza, High Dose Seasonal PF 08/29/2018   Influenza,inj,Quad PF,6+ Mos 11/05/2013, 09/12/2015, 08/27/2016, 09/02/2017   PFIZER(Purple Top)SARS-COV-2 Vaccination 01/28/2020, 02/28/2020   Pneumococcal Conjugate-13 10/09/2014   Pneumococcal Polysaccharide-23 02/11/2010, 01/18/2017   Td 11/29/2002   Tdap 11/05/2013   Pertinent  Health Maintenance Due  Topic Date Due   FOOT EXAM  11/28/2019   OPHTHALMOLOGY EXAM  12/23/2020   HEMOGLOBIN A1C  02/18/2022   COLONOSCOPY (Pts 45-5yrs Insurance coverage will need to be confirmed)  01/24/2028   INFLUENZA VACCINE  Completed   Fall Risk  12/11/2019 11/27/2018 11/14/2017 07/13/2016 05/16/2015  Falls in the past year? 1 1 Yes Yes No  Number falls in past yr: 1 1 1 2  or more -  Injury with Fall? 0 0 Yes Yes -  Risk Factor Category  - - High Fall Risk High Fall Risk -  Follow up Falls prevention discussed Falls prevention discussed Falls prevention discussed Falls prevention discussed -     Vitals:   09/11/21 1445  BP: 126/68  Pulse: 76  Resp:  18  Temp: (!) 97.2 F (36.2 C)  Weight: 179 lb (81.2 kg)  Height: 5\' 9"  (1.753 m)   Body mass index is 26.43 kg/m.  Physical Exam  GENERAL APPEARANCE: Well nourished. In no acute distress. Normal body habitus SKIN:  LLE with brace  MOUTH and THROAT: Lips are without lesions. Oral mucosa is moist and without lesions.  RESPIRATORY: Breathing is even & unlabored, BS CTAB CARDIAC: RRR, no murmur,no extra heart sounds, no edema GI: Abdomen soft, normal BS, no masses, no tenderness EXTREMITIES: Right upper arm SL PICC NEUROLOGICAL: There is no tremor. Speech is clear. Alert and oriented X 3.  PSYCHIATRIC: Affect and behavior are appropriate  Labs reviewed: Recent Labs    09/06/21 0421 09/07/21 0859 09/08/21 0650 09/09/21 1517 09/10/21 0434  NA 133*  --   --  130* 131*  K 4.8  --   --  4.0 4.0  CL 102  --   --  102 101  CO2 25  --   --  21* 25  GLUCOSE 179*  --   --  209* 122*  BUN 22  --   --  37* 31*  CREATININE 1.03   < > 1.41* 1.31* 1.18  CALCIUM 8.5*  --   --  8.2* 8.0*   < > = values in this interval not displayed.   Recent Labs    12/30/20 1143 05/21/21 0825 08/21/21 1210  AST 41* 42* 39*  ALT 41 29 24  ALKPHOS 87 81 117  BILITOT 0.8 0.8 0.7  PROT 7.5 7.2 7.5  ALBUMIN 3.9 3.4* 3.8   Recent Labs    05/21/21 0825 06/19/21 0600 09/05/21 2310 09/06/21 0421 09/08/21 1008  WBC 6.4   < > 13.7* 13.3* 7.9  NEUTROABS 3.8  --  10.5*  --   --   HGB 10.3*   < > 9.1* 8.4* 8.6*  HCT 30.7*   < > 26.7* 24.8* 25.3*  MCV 104.4*   < > 102.7* 103.8* 103.3*  PLT PLATELET CLUMPS NOTED ON SMEAR, UNABLE TO ESTIMATE   < > 118* 110* 124*   < > = values in this interval not displayed.   Lab Results  Component Value Date   TSH 1.81 11/05/2013   Lab Results  Component Value Date   HGBA1C 6.0 (A) 08/21/2021   Lab Results  Component Value Date   CHOL 135 07/14/2021   HDL 56.40 07/14/2021   LDLCALC 64 07/14/2021   LDLDIRECT 103.9  11/05/2013   TRIG 76.0 07/14/2021   CHOLHDL 2 07/14/2021    Significant Diagnostic Results in last 30 days:  DG Elbow Complete Right  Result Date: 09/05/2021 CLINICAL DATA:  70 year old male status post falls. Pain. EXAM: RIGHT ELBOW - COMPLETE 3+ VIEW COMPARISON:  None. FINDINGS: No definite joint effusion. Preserved alignment at the elbow. Joint spaces are normal for age. Mild degenerative spurring. Bone mineralization is within normal limits. Radial head appears intact. No acute osseous abnormality identified. No discrete soft tissue injury. IMPRESSION: No acute fracture or dislocation identified about the right elbow. Electronically Signed   By: Genevie Ann M.D.   On: 09/05/2021 06:36   DG Knee Complete 4 Views Left  Result Date: 09/05/2021 CLINICAL DATA:  70 year old male status post falls. Pain. EXAM: LEFT KNEE - COMPLETE 4+ VIEW COMPARISON:  No prior left knee series. FINDINGS: No evidence of joint effusion. Intact patella. Preserved alignment. Mild medial and lateral compartment joint degeneration, mild medial compartment joint space loss. No acute osseous abnormality identified. Small dystrophic calcification posterior to the knee. No discrete soft tissue injury. IMPRESSION: No acute fracture or dislocation identified about the left knee. Electronically Signed   By: Genevie Ann M.D.   On: 09/05/2021 06:37   DG Knee Complete 4 Views Right  Result Date: 09/05/2021 CLINICAL DATA:  70 year old male status post falls.  Pain. EXAM: RIGHT KNEE - COMPLETE 4+ VIEW COMPARISON:  Knee series 11/05/2013. FINDINGS: Anterior soft tissue swelling. No evidence of joint effusion. Intact patella. Preserved alignment. Chronic lateral compartment degenerative spurring. Mild joint space loss. No acute osseous abnormality identified. IMPRESSION: Anterior soft tissue swelling. No acute fracture or dislocation identified about the right knee. Electronically Signed   By: Genevie Ann M.D.   On: 09/05/2021 06:35   DG Hip Unilat  W or Wo Pelvis 2-3 Views Right  Result Date: 09/05/2021 CLINICAL DATA:  70 year old male status post fall.  Pain. EXAM: DG HIP (WITH OR WITHOUT PELVIS) 2-3V RIGHT COMPARISON:  Pelvis radiograph 08/20/2019. FINDINGS: Pelvis appears stable and intact. Femoral heads remain normally located. Stable proximal left femur. Proximal right femur appears intact. No acute osseous abnormality identified. Small chronic dystrophic calcifications about the right hip. Negative visible lower abdominal and pelvic visceral contours. IMPRESSION: No acute fracture or dislocation identified about the right hip or pelvis. Electronically Signed   By: Genevie Ann M.D.   On: 09/05/2021 08:49   [place] EKG SITE RITE  Result Date: 09/08/2021 If Site Rite image not attached, placement could not be confirmed due to current cardiac rhythm.   Assessment/Plan  1. Staphylococcal arthritis of left knee (HCC) -   Continue cefazolin and follow-up with infectious disease in 2 weeks -   Continue brace to LLE with walker when ambulating -   Follow-up with orthopedics, Dr. Sabra Heck, in 2 weeks  2. Pseudogout of knee, left -  currently having diarrhea, will decrease Colchicine to 0.6 mg daily instead of BID  3. 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) Lab Results  Component Value Date   HGBA1C 6.0 (A) 08/21/2021   -   Continue Novolin N 35 units at bedtime and 30 units in the morning, metformin 1000 mg daily -    Monitor CBGs  4. Alcoholic cirrhosis of liver without ascites (HCC) -   Stable, follow-up with GI  5. Macrocytic anemia Lab Results  Component Value Date   WBC 7.9 09/08/2021   HGB 8.6 (L) 09/08/2021   HCT 25.3 (L) 09/08/2021   MCV 103.3 (H) 09/08/2021   PLT 124 (L) 09/08/2021   -   Thought to be related to alcohol use and poor nutrition -     will check B12 and folate  6. Thrombocytopenia ([place]) Lab Results  Component Value Date   PLT 124 (L) 09/08/2021   -Thought to be secondary to cirrhosis,  stable  7. Diarrhea, unspecified type -   Thought to be side effect of colchicine and will decrease Colchicine 0.6 mg from twice daily to daily -   Will check for stool culture for C. difficile   Family/ staff Communication:   Discussed plan of care with resident and charge nurse.  Labs/tests ordered:  stool culture for CBC, B12 and folate  Goals of care:   Short-term care #   Durenda Age, DNP, MSN, FNP-BC [facility] [phone] (Monday-Friday 8:00 a.m. - 5:00 p.m.) [phone] (after hours)

## 2021-09-14 ENCOUNTER — Encounter: Payer: Self-pay | Admitting: Internal Medicine

## 2021-09-14 ENCOUNTER — Non-Acute Institutional Stay (SKILLED_NURSING_FACILITY): Payer: Medicare Other | Admitting: Internal Medicine

## 2021-09-14 DIAGNOSIS — E1159 Type 2 diabetes mellitus with other circulatory complications: Secondary | ICD-10-CM | POA: Diagnosis not present

## 2021-09-14 DIAGNOSIS — M00062 Staphylococcal arthritis, left knee: Secondary | ICD-10-CM

## 2021-09-14 DIAGNOSIS — Z9189 Other specified personal risk factors, not elsewhere classified: Secondary | ICD-10-CM

## 2021-09-14 DIAGNOSIS — D539 Nutritional anemia, unspecified: Secondary | ICD-10-CM

## 2021-09-14 DIAGNOSIS — E1165 Type 2 diabetes mellitus with hyperglycemia: Secondary | ICD-10-CM

## 2021-09-14 DIAGNOSIS — L89012 Pressure ulcer of right elbow, stage 2: Secondary | ICD-10-CM | POA: Diagnosis not present

## 2021-09-14 NOTE — Assessment & Plan Note (Addendum)
DM with  renal & vascular complications Glucose range as IP : [id number]. 08/21/21  OP glucose  23!!! He describes glucoses 40-80 "couple times/week" as fatigue or weakness PTA Current A1c:6% A1c goal : <8% Non selective beta blocker Propranolol changed to Metoprolol due to hypoglycemia risk on insulin

## 2021-09-14 NOTE — Assessment & Plan Note (Addendum)
Current H/H 8.6/25.3, essentially stable  Indices normochromic, macrocytic (MCV 103.3) ROS negative for bleeding dyscrasias & none reported by Staff Monitor folic acid & N98 level as no data in Epic

## 2021-09-14 NOTE — Patient Instructions (Signed)
See assessment and plan under each diagnosis in the problem list and acutely for this visit 

## 2021-09-14 NOTE — Assessment & Plan Note (Addendum)
Short & long term opiod risks as referenced in A Dumb Way to Die by Lorri Frederick , MD [facility], October 2017 discussed with him. He has history of sleep apnea and alcohol use syndrome associated with @ least 50% risk of death in 6 months if he is on long-term opioids.

## 2021-09-14 NOTE — Assessment & Plan Note (Signed)
  WBAT with brace and walker at SNF with PT/OT.

## 2021-09-14 NOTE — Progress Notes (Signed)
NURSING HOME LOCATION:  [facility] ROOM NUMBER:  [id number]  CODE STATUS:  Full Code  PCP:  Viviana Simpler MD  This is a comprehensive admission note to this SNFperformed on this date less than 30 days from date of admission. Included are preadmission medical/surgical history; reconciled medication list; family history; social history and comprehensive review of systems.  Corrections and additions to the records were documented. Comprehensive physical exam was also performed. Additionally a clinical summary was entered for each active diagnosis pertinent to this admission in the Problem List to enhance continuity of care.  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  Orthopedics saw the patient 10/9 and aspirated the joint.  Culture grew MRSA. Left knee arthroscopy was performed with irrigation and debridement and partial medial meniscectomy 10/10 by Dr. [name].  ID recommended 2 weeks of cefazolin with subsequent follow-up with ID as outpatient.  Microcytic anemia was stable and was attributed to poor nutrition and  chronic alcohol intake.  Thrombocytopenia was attributed to documented cirrhosis. Diabetes was stable as inpatient.  Weightbearing with brace and walker as tolerated was initiated .  He was discharged to SNF for PT/OT.  Past medical and surgical history: Includes history of DJD, pseudogout of the knee, CAD with history MI, depression, insulin-dependent diabetes with vascular complications, GERD, dyslipidemia, essential hypertension, and sleep apnea. Surgeries and procedures include coronary angioplasty and stent placement EGDs, and thoracic discectomy.  Social history: History of alcohol use syndrome, former smoker.He is retired from [employer].  Family history: Extensive history reviewed.   Review of systems: He has excellent comprehension of his diagnosis and hospital course.  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 This is associated with bruising & eschar. He takes a B12 supplement; he has never been told he had B12 deficiency.  He validates hypoglycemia "a couple times per week". The hypoglycemia is reported as in 40-80 range.  Hypoglycemia is manifested as a sensation of weakness or fatigue.  He continues to have pain in the left knee. He validates that he has sleep apnea but is noncompliant with CPAP.  Constitutional: No fever, significant weight change Eyes: No redness, discharge, pain, vision change ENT/mouth: No nasal congestion, purulent discharge, earache, change in hearing, sore throat  Cardiovascular: No chest pain, palpitations, paroxysmal nocturnal dyspnea, claudication  Respiratory: No cough, sputum production, hemoptysis, DOE Gastrointestinal: No heartburn, dysphagia, abdominal pain, nausea /vomiting, rectal bleeding, melena, change in bowels Genitourinary: No dysuria, hematuria, pyuria, incontinence, nocturia Dermatologic: No rash, pruritus Neurologic: No dizziness, headache, syncope, seizures Psychiatric: No significant anxiety, depression, insomnia, anorexia Endocrine: No change in hair/skin/nails, excessive thirst, excessive hunger, excessive urination  Hematologic/lymphatic: No significant lymphadenopathy, abnormal bleeding Allergy/immunology: No itchy/watery eyes, significant sneezing, urticaria, angioedema  Physical exam:  Pertinent or positive findings: Hair is disheveled.  He has a beard and mustache.  There is some malalignment of the teeth.  First heart sound is accentuated.  Pedal pulses are decreased.  Left lower extremity is in a brace.  He has 1/2+ edema at the left sock line and trace at the right.  Large eschar present over the right knee.  The right elbow is dressed.  There is a shallow ulcer with  suggestion of mucopurulent discharge.  He has diffuse irregular hypopigmented scarring, eschar formation, and bruising  over the upper extremities, left greater than right.  General appearance: no acute distress, increased work of breathing is present.   Lymphatic: No lymphadenopathy about the head, neck, axilla. Eyes: No conjunctival inflammation or lid edema is present. There is no scleral icterus. Ears:  External ear exam shows no significant lesions or deformities.   Nose:  External nasal examination shows no deformity or inflammation. Nasal mucosa are pink and moist without lesions, exudates Oral exam: Lips and gums are healthy appearing.There is no oropharyngeal erythema or exudate. Neck:  No thyromegaly, masses, tenderness noted.    Heart:  Normal rate and regular rhythm. S2 normal without gallop, murmur, click, rub.  Lungs: Chest clear to auscultation without wheezes, rhonchi, rales, rubs. Abdomen: Bowel sounds are normal.  Abdomen is soft and nontender with no organomegaly, hernias, masses. GU: Deferred  Extremities:  No cyanosis, clubbing. Neurologic exam: Balance, Rhomberg, finger to nose testing could not be completed due to clinical state Skin: Warm & dry w/o tenting. No significant rash.  See clinical summary under each active problem in the Problem List with associated updated therapeutic plan

## 2021-09-15 ENCOUNTER — Encounter: Payer: Self-pay | Admitting: Internal Medicine

## 2021-09-15 ENCOUNTER — Non-Acute Institutional Stay (SKILLED_NURSING_FACILITY): Payer: Medicare Other | Admitting: Adult Health

## 2021-09-15 ENCOUNTER — Encounter: Payer: Self-pay | Admitting: Adult Health

## 2021-09-15 DIAGNOSIS — E162 Hypoglycemia, unspecified: Secondary | ICD-10-CM

## 2021-09-15 DIAGNOSIS — M11262 Other chondrocalcinosis, left knee: Secondary | ICD-10-CM | POA: Diagnosis not present

## 2021-09-15 DIAGNOSIS — M00062 Staphylococcal arthritis, left knee: Secondary | ICD-10-CM

## 2021-09-15 DIAGNOSIS — F32A Depression, unspecified: Secondary | ICD-10-CM

## 2021-09-15 DIAGNOSIS — M25562 Pain in left knee: Secondary | ICD-10-CM

## 2021-09-15 LAB — CBC AND DIFFERENTIAL
HCT: 28 — AB (ref 41–53)
Hemoglobin: 9.5 — AB (ref 13.5–17.5)
Platelets: 211 (ref 150–399)
WBC: 9.1

## 2021-09-15 LAB — CBC: RBC: 2.84 — AB (ref 3.87–5.11)

## 2021-09-15 LAB — VITAMIN B12: Vitamin B-12: 1959

## 2021-09-15 LAB — BASIC METABOLIC PANEL: Creatinine: 1.3 (ref 0.6–1.3)

## 2021-09-15 NOTE — Progress Notes (Signed)
Location:  [place] Room Number: 858-I Place of Service:  SNF (31) Provider:  Durenda Age, DNP, FNP-BC  Patient Care Team: [name], MD as PCP - Cyndia Diver, MD as PCP - Cardiology (Cardiology) Debbora Dus, [facility] as Pharmacist (Pharmacist)  Extended Emergency Contact Information Primary Emergency Contact: [place] Phone: [phone] Relation: Brother  Code Status:  Full Code   Goals of care: Advanced Directive information Advanced Directives 09/15/2021  Does Patient Have a Medical Advance Directive? No  Would patient like information on creating a medical advance directive? No - Patient declined     Chief Complaint  Patient presents with   Acute Visit    Low blood sugar and pain management     HPI:  Pt is a 70 y.o. male seen today for low blood sugar this morning, 45. He is currently taking metformin 1000 mg twice a day, notably in 35 units at bedtime and 30 units in the morning.  Since admission to [facility] CBGs ranging from 45-265.  Latest A1c 6.0, 08/21/2021.  He is complaining of severe pain on his left knee and was on Percocet 5-325 mg every 6 hours as PRN X 5 days.  He has staphylococcal arthritis on left knee and currently on cefazolin IV.  He is being followed by infectious disease.  He also has gout on his left knee and currently on colchicine 0.6 mg daily.  Discussed with him that he has at least 80% risk of death in 6 months, per short and long-term opioid risks as referenced in A Dumb Way to Die by Lorri Frederick, MD Today's Hospitalist, October 2017. He stated,"At this point, I don't care. I am divorced and has 2 grown children."  He was admitted to [facility] on 09/10/2021 post hospital admission 09/05/2021 to 09/10/2021.  He is currently having short-term rehabilitation.  Past Medical History:  Diagnosis Date   Allergy    Anemia    Arthritis    Bilateral hands and feet    CAD (coronary artery disease)    s/p bifurcation stenting (DES x2) and stenting bifurcation lesion distal RCA (1 DES)   Cataract    Cirrhosis (HCC)    Depression    Diabetes mellitus    Esophageal varices (HCC)    GERD (gastroesophageal reflux disease)    Hyperlipemia    Hypertension    Myocardial infarction ([place])    Sleep apnea    CPAP   Past Surgical History:  Procedure Laterality Date   BIOPSY  09/01/2018   Procedure: BIOPSY;  Surgeon: Mauri Pole, MD;  Location: [facility] ENDOSCOPY;  Service: Endoscopy;;   CATARACT EXTRACTION Bilateral    CORONARY ANGIOPLASTY WITH STENT PLACEMENT   March 2010   Dr. Olevia Perches   ESOPHAGOGASTRODUODENOSCOPY (EGD) WITH PROPOFOL N/A 09/01/2018   Procedure: ESOPHAGOGASTRODUODENOSCOPY (EGD) WITH PROPOFOL;  Surgeon: Mauri Pole, MD;  Location: [place] ENDOSCOPY;  Service: Endoscopy;  Laterality: N/A;   KNEE ARTHROSCOPY Left 09/07/2021   Procedure: IRRIGATION AND DEBRIDEMENT OF LEFT KNEE; ARTHROSCOPY OF LEFT KNEE, PARTIAL MEDIAL [place].;  Surgeon: [name], MD;  Location: [facility] ORS;  Service: Orthopedics;  Laterality: Left;   LEFT HEART CATH AND CORONARY ANGIOGRAPHY N/A 08/31/2018   Procedure: LEFT HEART CATH AND CORONARY ANGIOGRAPHY;  Surgeon: Sherren Mocha, MD;  Location: [place] CV LAB;  Service: Cardiovascular;  Laterality: N/A;   THORACIC DISCECTOMY Right 06/19/2021   Procedure: Microdiscectomy - right - Thoracic Eleven-Thoracic Twelve;  Surgeon: Earnie Larsson, MD;  Location: [place] OR;  Service: Neurosurgery;  Laterality: Right;   TONSILLECTOMY  1959    No Known Allergies  Outpatient Encounter Medications as of 09/15/2021  Medication Sig   Alpha Lipoic Acid 200 MG CAPS Take 1 capsule by mouth daily.   Ascorbic Acid (VITAMIN C) 1000 MG tablet Take 1,000 mg by mouth daily.   aspirin EC 81 MG EC tablet Take 1 tablet (81 mg total) by mouth daily. Swallow whole.   bisacodyl (DULCOLAX) 10 MG suppository as needed. If not relieved by MOM, give  10 mg Bisacodyl suppositiory rectally X 1 dose in 24 hours as needed   ceFAZolin (ANCEF) IVPB Inject 2 g into the vein every 8 (eight) hours for 12 days. Indication:  MSSA septic knee Last Day of Therapy:  09/21/2021 Labs - Once weekly:  CBC/D, CRP, and SCr FAX weekly labs to [phone]   Coenzyme Q10 (CO Q 10) 100 MG CAPS Take 100 mg by mouth daily.   colchicine 0.6 MG tablet Take 0.6 mg by mouth daily.   feeding supplement, GLUCERNA SHAKE, (GLUCERNA SHAKE) LIQD Take 237 mLs by mouth daily.   ferrous sulfate 325 (65 FE) MG tablet Take 325 mg by mouth daily with breakfast.   furosemide (LASIX) 20 MG tablet Take 1 tablet by mouth once daily   insulin aspart (NOVOLOG FLEXPEN) 100 UNIT/ML FlexPen Inject 30-35 Units into the skin as directed. 30 units in the morning and 35 units at bedtime   losartan (COZAAR) 50 MG tablet Take 1 tablet (50 mg total) by mouth daily. Please make yearly appt with Dr. Burt Knack for October 2022 for future refills. Thank you 1st attempt   magnesium hydroxide (MILK OF MAGNESIA) 400 MG/5ML suspension If no BM in 3 days, give 30 cc Milk of Magnesium p.o. x 1 dose in 24 hours as needed (Do not use standing constipation orders for residents with renal failure CFR less than 30. Contact MD for orders) (Physician Order)   metFORMIN (GLUCOPHAGE) 1000 MG tablet Take 1,000 mg by mouth daily with breakfast.   metoprolol tartrate (LOPRESSOR) 50 MG tablet Take 50 mg by mouth 2 (two) times daily.   Multiple Vitamin (MULTIVITAMIN WITH MINERALS) TABS tablet Take 1 tablet by mouth at bedtime.   pantoprazole (PROTONIX) 40 MG tablet Take 1 tablet by mouth twice daily   PARoxetine (PAXIL) 40 MG tablet Take 1 tablet (40 mg total) by mouth every morning.   Polyethyl Glycol-Propyl Glycol (SYSTANE) 0.4-0.3 % SOLN Place 2 drops into both eyes daily.   rosuvastatin (CRESTOR) 10 MG tablet Take 1 tablet by mouth once daily   Sodium Phosphates (RA SALINE ENEMA RE) Place rectally. If not relieved by  Biscodyl suppository, give disposable Saline Enema rectally X 1 dose/24 hrs as needed   spironolactone (ALDACTONE) 50 MG tablet Take 1 tablet by mouth once daily   triamcinolone cream (KENALOG) 0.1 % APPLY TOPICALLY TO SCALP TWO TIMES DAILY AS NEEDED   Continuous Blood Gluc Receiver (FREESTYLE LIBRE 2 READER) DEVI Use as directed (Patient not taking: Reported on 09/15/2021)   Continuous Blood Gluc Sensor (FREESTYLE LIBRE 2 SENSOR) MISC Use as directed (Patient not taking: Reported on 09/15/2021)   glucose blood (ONE TOUCH ULTRA TEST) test strip Use to test blood sugar 2 times daily as instructed. Dx: E11.59, E11.65 (Patient not taking: Reported on 09/15/2021)   Insulin Pen Needle 32G X 4 MM MISC Use 4x a day - with insulin (Patient not taking: Reported on 09/15/2021)   Insulin Syringe-Needle U-100 (  RELION INSULIN SYRINGE) 31G X 15/64" 0.5 ML MISC USE  TWICE DAILY (Patient not taking: Reported on 63/87/5643)   ONETOUCH DELICA LANCETS FINE MISC Use to test blood sugar 2 times daily as instructed. Dx: E11.59, E11.65 (Patient not taking: Reported on 09/15/2021)   [DISCONTINUED] colchicine 0.6 MG tablet Take 1 tablet (0.6 mg total) by mouth 2 (two) times daily.   [DISCONTINUED] insulin NPH Human (NOVOLIN N RELION) 100 UNIT/ML injection Inject under skin 30 units in a.m. and 35-40 units at bedtime   [DISCONTINUED] metFORMIN (GLUCOPHAGE) 1000 MG tablet TAKE 1 TABLET BY MOUTH TWICE DAILY WITH MEALS   [DISCONTINUED] OVER THE COUNTER MEDICATION Take 2 capsules by mouth daily. Liver Support (Patient not taking: Reported on 09/15/2021)   [DISCONTINUED] propranolol (INDERAL) 80 MG tablet Take 1 tablet (80 mg total) by mouth 2 (two) times daily. (Patient not taking: Reported on 09/14/2021)   [DISCONTINUED] sildenafil (VIAGRA) 100 MG tablet TAKE 1 TABLET BY MOUTH ONCE DAILY AS NEEDED FOR ERECTILE DYSFUNCTION   No facility-administered encounter medications on file as of 09/15/2021.    Review of  Systems  GENERAL: No change in appetite, no fatigue, no weight changes, no fever, chills or weakness SKIN: Denies rash, itching, wounds, ulcer sores, or nail abnormalities MOUTH and THROAT: Denies oral discomfort, gingival pain or bleeding RESPIRATORY: no cough, SOB, DOE, wheezing, hemoptysis CARDIAC: No chest pain, edema or palpitations GI: No abdominal pain, diarrhea, constipation, heart burn, nausea or vomiting GU: Denies dysuria, frequency, hematuria or discharge NEUROLOGICAL: Denies dizziness, syncope, numbness, or headache PSYCHIATRIC: Denies feelings of depression or anxiety. No report of hallucinations, insomnia, paranoia, or agitation   Immunization History  Administered Date(s) Administered   Fluad Quad(high Dose 65+) 08/07/2019, 10/02/2020, 08/21/2021   Hep A / Hep B 04/02/2020, 04/09/2020, 05/01/2020   Influenza Split 08/30/2011, 09/18/2012   Influenza Whole 08/21/2008, 10/02/2009, 08/18/2010   Influenza, High Dose Seasonal PF 08/29/2018   Influenza,inj,Quad PF,6+ Mos 11/05/2013, 09/12/2015, 08/27/2016, 09/02/2017   PFIZER(Purple Top)SARS-COV-2 Vaccination 01/28/2020, 02/28/2020   Pneumococcal Conjugate-13 10/09/2014   Pneumococcal Polysaccharide-23 02/11/2010, 01/18/2017   Td 11/29/2002   Tdap 11/05/2013   Pertinent  Health Maintenance Due  Topic Date Due   FOOT EXAM  11/28/2019   OPHTHALMOLOGY EXAM  12/23/2020   HEMOGLOBIN A1C  02/18/2022   COLONOSCOPY (Pts 45-71yrs Insurance coverage will need to be confirmed)  01/24/2028   INFLUENZA VACCINE  Completed   Fall Risk  12/11/2019 11/27/2018 11/14/2017 07/13/2016 05/16/2015  Falls in the past year? 1 1 Yes Yes No  Number falls in past yr: 1 1 1 2  or more -  Injury with Fall? 0 0 Yes Yes -  Risk Factor Category  - - High Fall Risk High Fall Risk -  Follow up Falls prevention discussed Falls prevention discussed Falls prevention discussed Falls prevention discussed -     Vitals:   09/15/21 1130  BP: 107/61  Pulse:  64  Resp: 19  Temp: 97.7 F (36.5 C)  Weight: 179 lb (81.2 kg)  Height: 5\' 9"  (1.753 m)   Body mass index is 26.43 kg/m.  Physical Exam  GENERAL APPEARANCE: Well nourished. In no acute distress. Normal body habitus SKIN:  Skin is warm and dry.  MOUTH and THROAT: Lips are without lesions. Oral mucosa is moist and without lesions.  RESPIRATORY: Breathing is even & unlabored, BS CTAB CARDIAC: RRR, no murmur,no extra heart sounds, no edema GI: Abdomen soft, normal BS, no masses, no tenderness NEUROLOGICAL: There is no tremor. Speech  is clear. Alert and oriented X 3. PSYCHIATRIC:  Affect is sad  Labs reviewed: Recent Labs    09/06/21 0421 09/07/21 0859 09/08/21 0650 09/09/21 1517 09/10/21 0434  NA 133*  --   --  130* 131*  K 4.8  --   --  4.0 4.0  CL 102  --   --  102 101  CO2 25  --   --  21* 25  GLUCOSE 179*  --   --  209* 122*  BUN 22  --   --  37* 31*  CREATININE 1.03   < > 1.41* 1.31* 1.18  CALCIUM 8.5*  --   --  8.2* 8.0*   < > = values in this interval not displayed.   Recent Labs    12/30/20 1143 05/21/21 0825 08/21/21 1210  AST 41* 42* 39*  ALT 41 29 24  ALKPHOS 87 81 117  BILITOT 0.8 0.8 0.7  PROT 7.5 7.2 7.5  ALBUMIN 3.9 3.4* 3.8   Recent Labs    05/21/21 0825 06/19/21 0600 09/05/21 2310 09/06/21 0421 09/08/21 1008  WBC 6.4   < > 13.7* 13.3* 7.9  NEUTROABS 3.8  --  10.5*  --   --   HGB 10.3*   < > 9.1* 8.4* 8.6*  HCT 30.7*   < > 26.7* 24.8* 25.3*  MCV 104.4*   < > 102.7* 103.8* 103.3*  PLT PLATELET CLUMPS NOTED ON SMEAR, UNABLE TO ESTIMATE   < > 118* 110* 124*   < > = values in this interval not displayed.   Lab Results  Component Value Date   TSH 1.81 11/05/2013   Lab Results  Component Value Date   HGBA1C 6.0 (A) 08/21/2021   Lab Results  Component Value Date   CHOL 135 07/14/2021   HDL 56.40 07/14/2021   LDLCALC 64 07/14/2021   LDLDIRECT 103.9 11/05/2013   TRIG 76.0 07/14/2021   CHOLHDL 2 07/14/2021    Significant Diagnostic  Results in last 30 days:  DG Elbow Complete Right  Result Date: 09/05/2021 CLINICAL DATA:  70 year old male status post falls. Pain. EXAM: RIGHT ELBOW - COMPLETE 3+ VIEW COMPARISON:  None. FINDINGS: No definite joint effusion. Preserved alignment at the elbow. Joint spaces are normal for age. Mild degenerative spurring. Bone mineralization is within normal limits. Radial head appears intact. No acute osseous abnormality identified. No discrete soft tissue injury. IMPRESSION: No acute fracture or dislocation identified about the right elbow. Electronically Signed   By: Genevie Ann M.D.   On: 09/05/2021 06:36   DG Knee Complete 4 Views Left  Result Date: 09/05/2021 CLINICAL DATA:  70 year old male status post falls. Pain. EXAM: LEFT KNEE - COMPLETE 4+ VIEW COMPARISON:  No prior left knee series. FINDINGS: No evidence of joint effusion. Intact patella. Preserved alignment. Mild medial and lateral compartment joint degeneration, mild medial compartment joint space loss. No acute osseous abnormality identified. Small dystrophic calcification posterior to the knee. No discrete soft tissue injury. IMPRESSION: No acute fracture or dislocation identified about the left knee. Electronically Signed   By: Genevie Ann M.D.   On: 09/05/2021 06:37   DG Knee Complete 4 Views Right  Result Date: 09/05/2021 CLINICAL DATA:  70 year old male status post falls.  Pain. EXAM: RIGHT KNEE - COMPLETE 4+ VIEW COMPARISON:  Knee series 11/05/2013. FINDINGS: Anterior soft tissue swelling. No evidence of joint effusion. Intact patella. Preserved alignment. Chronic lateral compartment degenerative spurring. Mild joint space loss. No acute osseous abnormality identified. IMPRESSION:  Anterior soft tissue swelling. No acute fracture or dislocation identified about the right knee. Electronically Signed   By: Genevie Ann M.D.   On: 09/05/2021 06:35   DG Hip Unilat W or Wo Pelvis 2-3 Views Right  Result Date: 09/05/2021 CLINICAL DATA:  70 year old  male status post fall.  Pain. EXAM: DG HIP (WITH OR WITHOUT PELVIS) 2-3V RIGHT COMPARISON:  Pelvis radiograph 08/20/2019. FINDINGS: Pelvis appears stable and intact. Femoral heads remain normally located. Stable proximal left femur. Proximal right femur appears intact. No acute osseous abnormality identified. Small chronic dystrophic calcifications about the right hip. Negative visible lower abdominal and pelvic visceral contours. IMPRESSION: No acute fracture or dislocation identified about the right hip or pelvis. Electronically Signed   By: Genevie Ann M.D.   On: 09/05/2021 08:49   [place] EKG SITE RITE  Result Date: 09/08/2021 If Site Rite image not attached, placement could not be confirmed due to current cardiac rhythm.   Assessment/Plan  1. Acute pain of left knee -   will re-start Percocet 5-325 mg 1 tab every 8 hours PRN X 3 days then every 12 hours PRN  2. Staphylococcal arthritis of left knee (HCC) -   Continue LLE brace and to use walker when ambulating -    Follow-up with orthopedics, Dr. Sabra Heck, in 2 weeks and infectious disease in 2 weeks  3. Pseudogout of knee, left -    Continue colchicine 0.6 mg daily  4. Hypoglycemia -  CBG 45 this morning, will discontinue metformin -   Continue Novolin N 30 units in the morning and 35 units at bedtime -    Monitor CBGs  5. Depression, unspecified depression type -  will refer for psych consult    Family/ staff Communication:   Discussed plan of care with resident and charge nurse.  Labs/tests ordered:   None  Goals of care:   Short-term care   Durenda Age, DNP, MSN, FNP-BC [facility] [phone] (Monday-Friday 8:00 a.m. - 5:00 p.m.) [phone] (after hours)

## 2021-09-17 LAB — BASIC METABOLIC PANEL
BUN: 24 — AB (ref 4–21)
CO2: 26 — AB (ref 13–22)
Chloride: 105 (ref 99–108)
Creatinine: 1.1 (ref 0.6–1.3)
Glucose: 115
Potassium: 4.3 (ref 3.4–5.3)
Sodium: 138 (ref 137–147)

## 2021-09-17 LAB — COMPREHENSIVE METABOLIC PANEL
Calcium: 8.9 (ref 8.7–10.7)
GFR calc Af Amer: 77.05
GFR calc non Af Amer: 66.48

## 2021-09-18 DIAGNOSIS — Z792 Long term (current) use of antibiotics: Secondary | ICD-10-CM | POA: Diagnosis not present

## 2021-09-21 ENCOUNTER — Encounter: Payer: Self-pay | Admitting: Adult Health

## 2021-09-21 ENCOUNTER — Non-Acute Institutional Stay (SKILLED_NURSING_FACILITY): Payer: Medicare Other | Admitting: Adult Health

## 2021-09-21 DIAGNOSIS — Z794 Long term (current) use of insulin: Secondary | ICD-10-CM

## 2021-09-21 DIAGNOSIS — I10 Essential (primary) hypertension: Secondary | ICD-10-CM | POA: Diagnosis not present

## 2021-09-21 DIAGNOSIS — M11262 Other chondrocalcinosis, left knee: Secondary | ICD-10-CM | POA: Diagnosis not present

## 2021-09-21 DIAGNOSIS — M00062 Staphylococcal arthritis, left knee: Secondary | ICD-10-CM | POA: Diagnosis not present

## 2021-09-21 DIAGNOSIS — E119 Type 2 diabetes mellitus without complications: Secondary | ICD-10-CM | POA: Diagnosis not present

## 2021-09-21 NOTE — Progress Notes (Signed)
Location:  [facility] Room Number: [place] of Service:  SNF (31) Provider:  Durenda Age, DNP, FNP-BC  Patient Care Team: [name], MD as PCP - Cyndia Diver, MD as PCP - Cardiology (Cardiology) Debbora Dus, [facility] as Pharmacist (Pharmacist)  Extended Emergency Contact Information Primary Emergency Contact: [place] Phone: [phone] Relation: Brother  Code Status: Full code  Goals of care: Advanced Directive information Advanced Directives 09/15/2021  Does Patient Have a Medical Advance Directive? No  Would patient like information on creating a medical advance directive? No - Patient declined     Chief Complaint  Patient presents with   Acute Visit    Short-term care visit    HPI:  Pt is a 70 y.o. male seen today for medical management of chronic diseases. He is currently having a short-term rehabilitation at [facility] post hospital admission 09/05/2021 to 09/10/2021 for staphylococcal arthritis of left knee.  He followed up with infectious disease on 09/18/2021 and IV cefazolin was extended 09/28/2021.  He had episode of low blood sugar in the mornings, 68, low, 93.  Patient Novolin N 30 units in the morning and 35 units at bedtime.  Metformin was recently discontinued due to hypoglycemia.  He denies having diarrhea. SBPs ranging from 112-128, with outlier 87.  He takes losartan 50 mg daily, metoprolol tartrate 50 mg twice a day and spironolactone 50 mg daily for hypertension.   Past Medical History:  Diagnosis Date   Allergy    Anemia    Arthritis    Bilateral hands and feet   CAD (coronary artery disease)    s/p bifurcation stenting (DES x2) and stenting bifurcation lesion distal RCA (1 DES)   Cataract    Cirrhosis (HCC)    Depression    Diabetes mellitus    Esophageal varices (HCC)    GERD (gastroesophageal reflux disease)    Hyperlipemia    Hypertension    Myocardial  infarction ([place])    Sleep apnea    CPAP   Past Surgical History:  Procedure Laterality Date   BIOPSY  09/01/2018   Procedure: BIOPSY;  Surgeon: Mauri Pole, MD;  Location: [facility];  Service: Endoscopy;;   CATARACT EXTRACTION Bilateral    CORONARY ANGIOPLASTY WITH STENT PLACEMENT   March 2010   Dr. Olevia Perches   ESOPHAGOGASTRODUODENOSCOPY (EGD) WITH PROPOFOL N/A 09/01/2018   Procedure: ESOPHAGOGASTRODUODENOSCOPY (EGD) WITH PROPOFOL;  Surgeon: Mauri Pole, MD;  Location: [facility];  Service: Endoscopy;  Laterality: N/A;   KNEE ARTHROSCOPY Left 09/07/2021   Procedure: IRRIGATION AND DEBRIDEMENT OF LEFT KNEE; ARTHROSCOPY OF LEFT KNEE, PARTIAL MEDIAL [place].;  Surgeon: [name], MD;  Location: [facility] ORS;  Service: Orthopedics;  Laterality: Left;   LEFT HEART CATH AND CORONARY ANGIOGRAPHY N/A 08/31/2018   Procedure: LEFT HEART CATH AND CORONARY ANGIOGRAPHY;  Surgeon: Sherren Mocha, MD;  Location: [facility] CV LAB;  Service: Cardiovascular;  Laterality: N/A;   THORACIC DISCECTOMY Right 06/19/2021   Procedure: Microdiscectomy - right - Thoracic Eleven-Thoracic Twelve;  Surgeon: Earnie Larsson, MD;  Location: [facility];  Service: Neurosurgery;  Laterality: Right;   TONSILLECTOMY  1959    No Known Allergies  Outpatient Encounter Medications as of 09/21/2021  Medication Sig   Alpha Lipoic Acid 200 MG CAPS Take 1 capsule by mouth daily.   Ascorbic Acid (VITAMIN C) 1000 MG tablet Take 1,000 mg by mouth daily.   aspirin EC 81 MG EC tablet Take 1 tablet (81  mg total) by mouth daily. Swallow whole.   bisacodyl (DULCOLAX) 10 MG suppository as needed. If not relieved by MOM, give 10 mg Bisacodyl suppositiory rectally X 1 dose in 24 hours as needed   ceFAZolin (ANCEF) IVPB Inject 2 g into the vein every 8 (eight) hours for 12 days. Indication:  MSSA septic knee Last Day of Therapy:  09/21/2021 Labs - Once weekly:  CBC/D, CRP, and SCr FAX weekly labs to [phone]   Coenzyme Q10  (CO Q 10) 100 MG CAPS Take 100 mg by mouth daily.   colchicine 0.6 MG tablet Take 0.6 mg by mouth daily.   Continuous Blood Gluc Receiver (FREESTYLE LIBRE 2 READER) DEVI Use as directed (Patient not taking: Reported on 09/15/2021)   Continuous Blood Gluc Sensor (FREESTYLE LIBRE 2 SENSOR) MISC Use as directed (Patient not taking: Reported on 09/15/2021)   feeding supplement, GLUCERNA SHAKE, (GLUCERNA SHAKE) LIQD Take 237 mLs by mouth daily.   ferrous sulfate 325 (65 FE) MG tablet Take 325 mg by mouth daily with breakfast.   furosemide (LASIX) 20 MG tablet Take 1 tablet by mouth once daily   glucose blood (ONE TOUCH ULTRA TEST) test strip Use to test blood sugar 2 times daily as instructed. Dx: E11.59, E11.65 (Patient not taking: Reported on 09/15/2021)   insulin aspart (NOVOLOG FLEXPEN) 100 UNIT/ML FlexPen Inject 30-35 Units into the skin as directed. 30 units in the morning and 35 units at bedtime   Insulin Pen Needle 32G X 4 MM MISC Use 4x a day - with insulin (Patient not taking: Reported on 09/15/2021)   Insulin Syringe-Needle U-100 (RELION INSULIN SYRINGE) 31G X 15/64" 0.5 ML MISC USE  TWICE DAILY (Patient not taking: Reported on 09/15/2021)   losartan (COZAAR) 50 MG tablet Take 1 tablet (50 mg total) by mouth daily. Please make yearly appt with Dr. Burt Knack for October 2022 for future refills. Thank you 1st attempt   magnesium hydroxide (MILK OF MAGNESIA) 400 MG/5ML suspension If no BM in 3 days, give 30 cc Milk of Magnesium p.o. x 1 dose in 24 hours as needed (Do not use standing constipation orders for residents with renal failure CFR less than 30. Contact MD for orders) (Physician Order)   metFORMIN (GLUCOPHAGE) 1000 MG tablet Take 1,000 mg by mouth daily with breakfast.   metoprolol tartrate (LOPRESSOR) 50 MG tablet Take 50 mg by mouth 2 (two) times daily.   Multiple Vitamin (MULTIVITAMIN WITH MINERALS) TABS tablet Take 1 tablet by mouth at bedtime.   ONETOUCH DELICA LANCETS FINE MISC Use to  test blood sugar 2 times daily as instructed. Dx: E11.59, E11.65 (Patient not taking: Reported on 09/15/2021)   pantoprazole (PROTONIX) 40 MG tablet Take 1 tablet by mouth twice daily   PARoxetine (PAXIL) 40 MG tablet Take 1 tablet (40 mg total) by mouth every morning.   Polyethyl Glycol-Propyl Glycol (SYSTANE) 0.4-0.3 % SOLN Place 2 drops into both eyes daily.   rosuvastatin (CRESTOR) 10 MG tablet Take 1 tablet by mouth once daily   Sodium Phosphates (RA SALINE ENEMA RE) Place rectally. If not relieved by Biscodyl suppository, give disposable Saline Enema rectally X 1 dose/24 hrs as needed   spironolactone (ALDACTONE) 50 MG tablet Take 1 tablet by mouth once daily   triamcinolone cream (KENALOG) 0.1 % APPLY TOPICALLY TO SCALP TWO TIMES DAILY AS NEEDED   No facility-administered encounter medications on file as of 09/21/2021.    Review of Systems  GENERAL: No change in appetite, no fatigue, no  weight changes, no fever or chills  MOUTH and THROAT: Denies oral discomfort, gingival pain or bleeding RESPIRATORY: no cough, SOB, DOE, wheezing, hemoptysis CARDIAC: No chest pain, edema or palpitations GI: No abdominal pain, diarrhea, constipation, heart burn, nausea or vomiting GU: Denies dysuria, frequency, hematuria, incontinence, or discharge NEUROLOGICAL: Denies dizziness, syncope, numbness, or headache PSYCHIATRIC: Denies feelings of depression or anxiety. No report of hallucinations, insomnia, paranoia, or agitation   Immunization History  Administered Date(s) Administered   Fluad Quad(high Dose 65+) 08/07/2019, 10/02/2020, 08/21/2021   Hep A / Hep B 04/02/2020, 04/09/2020, 05/01/2020   Influenza Split 08/30/2011, 09/18/2012   Influenza Whole 08/21/2008, 10/02/2009, 08/18/2010   Influenza, High Dose Seasonal PF 08/29/2018   Influenza,inj,Quad PF,6+ Mos 11/05/2013, 09/12/2015, 08/27/2016, 09/02/2017   PFIZER(Purple Top)SARS-COV-2 Vaccination 01/28/2020, 02/28/2020   Pneumococcal  Conjugate-13 10/09/2014   Pneumococcal Polysaccharide-23 02/11/2010, 01/18/2017   Td 11/29/2002   Tdap 11/05/2013   Pertinent  Health Maintenance Due  Topic Date Due   FOOT EXAM  11/28/2019   OPHTHALMOLOGY EXAM  12/23/2020   HEMOGLOBIN A1C  02/18/2022   COLONOSCOPY (Pts 45-72yrs Insurance coverage will need to be confirmed)  01/24/2028   INFLUENZA VACCINE  Completed   Fall Risk  12/11/2019 11/27/2018 11/14/2017 07/13/2016 05/16/2015  Falls in the past year? 1 1 Yes Yes No  Number falls in past yr: 1 1 1 2  or more -  Injury with Fall? 0 0 Yes Yes -  Risk Factor Category  - - High Fall Risk High Fall Risk -  Follow up Falls prevention discussed Falls prevention discussed Falls prevention discussed Falls prevention discussed -     Vitals:   09/21/21 1000  BP: 115/73  Pulse: 63  Resp: 18  Temp: 97.9 F (36.6 C)  Weight: 179 lb (81.2 kg)  Height: 5\' 9"  (1.753 m)   Body mass index is 26.43 kg/m.  Physical Exam  GENERAL APPEARANCE: Well nourished. In no acute distress. Normal body habitus SKIN:  Scab on right knee; Left knee with immobilizer; right upper arm single-lumen PICC line MOUTH and THROAT: Lips are without lesions. Oral mucosa is moist and without lesions.  RESPIRATORY: Breathing is even & unlabored, BS CTAB CARDIAC: RRR, no murmur,no extra heart sounds, no edema GI: Abdomen soft, normal BS, no masses, no tenderness NEUROLOGICAL: There is no tremor. Speech is clear. Alert and oriented X 3. PSYCHIATRIC:  Affect and behavior are appropriate  Labs reviewed: Recent Labs    09/06/21 0421 09/07/21 0859 09/08/21 0650 09/09/21 1517 09/10/21 0434  NA 133*  --   --  130* 131*  K 4.8  --   --  4.0 4.0  CL 102  --   --  102 101  CO2 25  --   --  21* 25  GLUCOSE 179*  --   --  209* 122*  BUN 22  --   --  37* 31*  CREATININE 1.03   < > 1.41* 1.31* 1.18  CALCIUM 8.5*  --   --  8.2* 8.0*   < > = values in this interval not displayed.   Recent Labs    12/30/20 1143  05/21/21 0825 08/21/21 1210  AST 41* 42* 39*  ALT 41 29 24  ALKPHOS 87 81 117  BILITOT 0.8 0.8 0.7  PROT 7.5 7.2 7.5  ALBUMIN 3.9 3.4* 3.8   Recent Labs    05/21/21 0825 06/19/21 0600 09/05/21 2310 09/06/21 0421 09/08/21 1008  WBC 6.4   < > 13.7* 13.3*  7.9  NEUTROABS 3.8  --  10.5*  --   --   HGB 10.3*   < > 9.1* 8.4* 8.6*  HCT 30.7*   < > 26.7* 24.8* 25.3*  MCV 104.4*   < > 102.7* 103.8* 103.3*  PLT PLATELET CLUMPS NOTED ON SMEAR, UNABLE TO ESTIMATE   < > 118* 110* 124*   < > = values in this interval not displayed.   Lab Results  Component Value Date   TSH 1.81 11/05/2013   Lab Results  Component Value Date   HGBA1C 6.0 (A) 08/21/2021   Lab Results  Component Value Date   CHOL 135 07/14/2021   HDL 56.40 07/14/2021   LDLCALC 64 07/14/2021   LDLDIRECT 103.9 11/05/2013   TRIG 76.0 07/14/2021   CHOLHDL 2 07/14/2021    Significant Diagnostic Results in last 30 days:  DG Elbow Complete Right  Result Date: 09/05/2021 CLINICAL DATA:  70 year old male status post falls. Pain. EXAM: RIGHT ELBOW - COMPLETE 3+ VIEW COMPARISON:  None. FINDINGS: No definite joint effusion. Preserved alignment at the elbow. Joint spaces are normal for age. Mild degenerative spurring. Bone mineralization is within normal limits. Radial head appears intact. No acute osseous abnormality identified. No discrete soft tissue injury. IMPRESSION: No acute fracture or dislocation identified about the right elbow. Electronically Signed   By: Genevie Ann M.D.   On: 09/05/2021 06:36   DG Knee Complete 4 Views Left  Result Date: 09/05/2021 CLINICAL DATA:  70 year old male status post falls. Pain. EXAM: LEFT KNEE - COMPLETE 4+ VIEW COMPARISON:  No prior left knee series. FINDINGS: No evidence of joint effusion. Intact patella. Preserved alignment. Mild medial and lateral compartment joint degeneration, mild medial compartment joint space loss. No acute osseous abnormality identified. Small dystrophic calcification  posterior to the knee. No discrete soft tissue injury. IMPRESSION: No acute fracture or dislocation identified about the left knee. Electronically Signed   By: Genevie Ann M.D.   On: 09/05/2021 06:37   DG Knee Complete 4 Views Right  Result Date: 09/05/2021 CLINICAL DATA:  70 year old male status post falls.  Pain. EXAM: RIGHT KNEE - COMPLETE 4+ VIEW COMPARISON:  Knee series 11/05/2013. FINDINGS: Anterior soft tissue swelling. No evidence of joint effusion. Intact patella. Preserved alignment. Chronic lateral compartment degenerative spurring. Mild joint space loss. No acute osseous abnormality identified. IMPRESSION: Anterior soft tissue swelling. No acute fracture or dislocation identified about the right knee. Electronically Signed   By: Genevie Ann M.D.   On: 09/05/2021 06:35   DG Hip Unilat W or Wo Pelvis 2-3 Views Right  Result Date: 09/05/2021 CLINICAL DATA:  70 year old male status post fall.  Pain. EXAM: DG HIP (WITH OR WITHOUT PELVIS) 2-3V RIGHT COMPARISON:  Pelvis radiograph 08/20/2019. FINDINGS: Pelvis appears stable and intact. Femoral heads remain normally located. Stable proximal left femur. Proximal right femur appears intact. No acute osseous abnormality identified. Small chronic dystrophic calcifications about the right hip. Negative visible lower abdominal and pelvic visceral contours. IMPRESSION: No acute fracture or dislocation identified about the right hip or pelvis. Electronically Signed   By: Genevie Ann M.D.   On: 09/05/2021 08:49   [place] EKG SITE RITE  Result Date: 09/08/2021 If Site Rite image not attached, placement could not be confirmed due to current cardiac rhythm.   Assessment/Plan  1. 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 ([place]) Lab Results  Component Value Date   HGBA1C 6.0 (A) 08/21/2021   -    will change Novolin N  to 30 units twice a day   2. Staphylococcal arthritis of left knee (HCC) -     Continue cefazolin 2 g / 50 mL every 8 hours till 09/28/2021 -     Percocet 5-325 mg 1 tab every 12 hours PRN for pain -    Follows up with infectious disease  3. Pseudogout of knee, left -   Denies diarrhea, continue colchicine 0.6 mg daily  4. Essential hypertension -   BP stable, continue with spironolactone 50 mg daily, losartan 50 mg daily and metoprolol tartrate 50 mg twice a day      Family/ staff Communication: Discussed plan of care with resident and charge nurse.  Labs/tests ordered: CBC with differentials and BMP with GFR  Goals of care:   Short-term care   Durenda Age, DNP, MSN, FNP-BC [facility] [phone] (Monday-Friday 8:00 a.m. - 5:00 p.m.) [phone] (after hours)

## 2021-09-22 LAB — COMPREHENSIVE METABOLIC PANEL
Calcium: 8.5 — AB (ref 8.7–10.7)
GFR calc Af Amer: 64.14
GFR calc non Af Amer: 55.34

## 2021-09-22 LAB — BASIC METABOLIC PANEL
BUN: 21 (ref 4–21)
CO2: 24 — AB (ref 13–22)
Chloride: 107 (ref 99–108)
Creatinine: 1.3 (ref 0.6–1.3)
Glucose: 114
Potassium: 4.6 (ref 3.4–5.3)
Sodium: 139 (ref 137–147)

## 2021-09-22 LAB — CBC AND DIFFERENTIAL
HCT: 26 — AB (ref 41–53)
Hemoglobin: 8.7 — AB (ref 13.5–17.5)
Platelets: 185 (ref 150–399)
WBC: 6.1

## 2021-09-22 LAB — CBC: RBC: 2.58 — AB (ref 3.87–5.11)

## 2021-09-25 ENCOUNTER — Encounter: Payer: Self-pay | Admitting: Adult Health

## 2021-09-25 ENCOUNTER — Non-Acute Institutional Stay (SKILLED_NURSING_FACILITY): Payer: Medicare Other | Admitting: Adult Health

## 2021-09-25 DIAGNOSIS — I10 Essential (primary) hypertension: Secondary | ICD-10-CM | POA: Diagnosis not present

## 2021-09-25 DIAGNOSIS — E1122 Type 2 diabetes mellitus with diabetic chronic kidney disease: Secondary | ICD-10-CM

## 2021-09-25 DIAGNOSIS — N1831 Chronic kidney disease, stage 3a: Secondary | ICD-10-CM

## 2021-09-25 DIAGNOSIS — M00062 Staphylococcal arthritis, left knee: Secondary | ICD-10-CM | POA: Diagnosis not present

## 2021-09-25 DIAGNOSIS — Z794 Long term (current) use of insulin: Secondary | ICD-10-CM

## 2021-09-25 NOTE — Progress Notes (Signed)
Location:  [place] Room Number: [place]:  SNF (31) Provider:  Durenda Age, DNP, FNP-BC  Patient Care Team: [name], MD as PCP - Cyndia Diver, MD as PCP - Cardiology (Cardiology) Debbora Dus, [facility] as Pharmacist (Pharmacist)  Extended Emergency Contact Information Primary Emergency Contact: Ashley Phone: [phone] Relation: Brother  Code Status:  FULL CODE  Goals of care: Advanced Directive information Advanced Directives 09/15/2021  Does Patient Have a Medical Advance Directive? No  Would patient like information on creating a medical advance directive? No - Patient declined     Chief Complaint  Patient presents with   Acute Visit    Low blood sugar    HPI:  Pt is a 70 y.o. male seen today for hypoglycemia. He had CBG 54 this morning. He takes Novolin N 30 units SQ AM and HS for diabetes mellitus. SBPs ranging from 111 to 129, with outlier 141. He takes losartan 50 mg 1 tab daily and metoprolol tartrate 50 mg twice a day for diabetes mellitus. 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  Orthopedics removed stitches on his left knee yesterday. He is now WBAT with walker.   Past Medical History:  Diagnosis Date   Allergy    Anemia    Arthritis    Bilateral hands and feet   CAD (coronary artery disease)    s/p bifurcation stenting (DES x2) and stenting bifurcation lesion distal RCA (1 DES)   Cataract    Cirrhosis (HCC)    Depression    Diabetes mellitus    Esophageal varices (HCC)    GERD (gastroesophageal reflux disease)    Hyperlipemia    Hypertension    Myocardial infarction ([place])    Sleep apnea    CPAP   Past Surgical History:  Procedure Laterality Date   BIOPSY  09/01/2018   Procedure: BIOPSY;  Surgeon: Mauri Pole, MD;  Location: [facility];  Service: Endoscopy;;   CATARACT EXTRACTION Bilateral    CORONARY ANGIOPLASTY WITH  STENT PLACEMENT   March 2010   Dr. Olevia Perches   ESOPHAGOGASTRODUODENOSCOPY (EGD) WITH PROPOFOL N/A 09/01/2018   Procedure: ESOPHAGOGASTRODUODENOSCOPY (EGD) WITH PROPOFOL;  Surgeon: Mauri Pole, MD;  Location: [place] [facility];  Service: Endoscopy;  Laterality: N/A;   KNEE ARTHROSCOPY Left 09/07/2021   Procedure: IRRIGATION AND DEBRIDEMENT OF LEFT KNEE; ARTHROSCOPY OF LEFT KNEE, PARTIAL MEDIAL [place].;  Surgeon: [name], MD;  Location: [facility] ORS;  Service: Orthopedics;  Laterality: Left;   LEFT HEART CATH AND CORONARY ANGIOGRAPHY N/A 08/31/2018   Procedure: LEFT HEART CATH AND CORONARY ANGIOGRAPHY;  Surgeon: Sherren Mocha, MD;  Location: [place] [facility];  Service: Cardiovascular;  Laterality: N/A;   THORACIC DISCECTOMY Right 06/19/2021   Procedure: Microdiscectomy - right - Thoracic Eleven-Thoracic Twelve;  Surgeon: Earnie Larsson, MD;  Location: [place];  Service: Neurosurgery;  Laterality: Right;   TONSILLECTOMY  1959    No Known Allergies  Outpatient Encounter Medications as of 09/25/2021  Medication Sig   Alpha Lipoic Acid 200 MG CAPS Take 1 capsule by mouth daily.   Ascorbic Acid (VITAMIN C) 1000 MG tablet Take 1,000 mg by mouth daily.   aspirin EC 81 MG EC tablet Take 1 tablet (81 mg total) by mouth daily. Swallow whole.   bisacodyl (DULCOLAX) 10 MG suppository as needed. If not relieved by MOM, give 10 mg Bisacodyl suppositiory rectally X 1 dose in 24 hours as needed  Coenzyme Q10 (CO Q 10) 100 MG CAPS Take 100 mg by mouth daily.   colchicine 0.6 MG tablet Take 0.6 mg by mouth daily.   Continuous Blood Gluc Receiver (FREESTYLE LIBRE 2 READER) DEVI Use as directed (Patient not taking: Reported on 09/15/2021)   Continuous Blood Gluc Sensor (FREESTYLE LIBRE 2 SENSOR) MISC Use as directed (Patient not taking: Reported on 09/15/2021)   feeding supplement, GLUCERNA SHAKE, (GLUCERNA SHAKE) LIQD Take 237 mLs by mouth daily.   ferrous sulfate 325 (65 FE) MG tablet Take 325 mg by mouth  daily with breakfast.   furosemide (LASIX) 20 MG tablet Take 1 tablet by mouth once daily   glucose blood (ONE TOUCH ULTRA TEST) test strip Use to test blood sugar 2 times daily as instructed. Dx: E11.59, E11.65 (Patient not taking: Reported on 09/15/2021)   insulin aspart (NOVOLOG FLEXPEN) 100 UNIT/ML FlexPen Inject 30-35 Units into the skin as directed. 30 units in the morning and 35 units at bedtime   Insulin Pen Needle 32G X 4 MM MISC Use 4x a day - with insulin (Patient not taking: Reported on 09/15/2021)   Insulin Syringe-Needle U-100 (RELION INSULIN SYRINGE) 31G X 15/64" 0.5 ML MISC USE  TWICE DAILY (Patient not taking: Reported on 09/15/2021)   losartan (COZAAR) 50 MG tablet Take 1 tablet (50 mg total) by mouth daily. Please make yearly appt with Dr. Burt Knack for October 2022 for future refills. Thank you 1st attempt   magnesium hydroxide (MILK OF MAGNESIA) 400 MG/5ML suspension If no BM in 3 days, give 30 cc Milk of Magnesium p.o. x 1 dose in 24 hours as needed (Do not use standing constipation orders for residents with renal failure CFR less than 30. Contact MD for orders) (Physician Order)   metFORMIN (GLUCOPHAGE) 1000 MG tablet Take 1,000 mg by mouth daily with breakfast.   metoprolol tartrate (LOPRESSOR) 50 MG tablet Take 50 mg by mouth 2 (two) times daily.   Multiple Vitamin (MULTIVITAMIN WITH MINERALS) TABS tablet Take 1 tablet by mouth at bedtime.   ONETOUCH DELICA LANCETS FINE MISC Use to test blood sugar 2 times daily as instructed. Dx: E11.59, E11.65 (Patient not taking: Reported on 09/15/2021)   pantoprazole (PROTONIX) 40 MG tablet Take 1 tablet by mouth twice daily   PARoxetine (PAXIL) 40 MG tablet Take 1 tablet (40 mg total) by mouth every morning.   Polyethyl Glycol-Propyl Glycol (SYSTANE) 0.4-0.3 % SOLN Place 2 drops into both eyes daily.   rosuvastatin (CRESTOR) 10 MG tablet Take 1 tablet by mouth once daily   Sodium Phosphates (RA SALINE ENEMA RE) Place rectally. If not  relieved by Biscodyl suppository, give disposable Saline Enema rectally X 1 dose/24 hrs as needed   spironolactone (ALDACTONE) 50 MG tablet Take 1 tablet by mouth once daily   triamcinolone cream (KENALOG) 0.1 % APPLY TOPICALLY TO SCALP TWO TIMES DAILY AS NEEDED   No facility-administered encounter medications on file as of 09/25/2021.    Review of Systems  GENERAL: No change in appetite, no fatigue, no weight changes, no fever or chills MOUTH and THROAT: Denies oral discomfort, gingival pain or bleeding RESPIRATORY: no cough, SOB, DOE, wheezing, hemoptysis CARDIAC: No chest pain, edema or palpitations GI: No abdominal pain, diarrhea, constipation, heart burn, nausea or vomiting GU: Denies dysuria, frequency, hematuria, incontinence, or discharge NEUROLOGICAL: Denies dizziness, syncope, numbness, or headache PSYCHIATRIC: Denies feelings of depression or anxiety. No report of hallucinations, insomnia, paranoia, or agitation   Immunization History  Administered Date(s) Administered  Fluad Quad(high Dose 65+) 08/07/2019, 10/02/2020, 08/21/2021   Hep A / Hep B 04/02/2020, 04/09/2020, 05/01/2020   Influenza Split 08/30/2011, 09/18/2012   Influenza Whole 08/21/2008, 10/02/2009, 08/18/2010   Influenza, High Dose Seasonal PF 08/29/2018   Influenza,inj,Quad PF,6+ Mos 11/05/2013, 09/12/2015, 08/27/2016, 09/02/2017   PFIZER(Purple Top)SARS-COV-2 Vaccination 01/28/2020, 02/28/2020   Pneumococcal Conjugate-13 10/09/2014   Pneumococcal Polysaccharide-23 02/11/2010, 01/18/2017   Td 11/29/2002   Tdap 11/05/2013   Pertinent  Health Maintenance Due  Topic Date Due   FOOT EXAM  11/28/2019   OPHTHALMOLOGY EXAM  12/23/2020   HEMOGLOBIN A1C  02/18/2022   COLONOSCOPY (Pts 45-55yrs Insurance coverage will need to be confirmed)  01/24/2028   INFLUENZA VACCINE  Completed   Fall Risk 09/08/2021 09/08/2021 09/09/2021 09/09/2021 09/10/2021  Falls in the past year? - - - - -  Was there an injury with  Fall? - - - - -  Fall Risk Category Calculator - - - - -  Fall Risk Category - - - - -  Patient Fall Risk Level High fall risk High fall risk High fall risk High fall risk High fall risk  Fall risk Follow up - - - - -     Vitals:   09/25/21 1644  BP: (!) 141/81  Pulse: 74  Resp: 18  Temp: 97.6 F (36.4 C)  Weight: 163 lb 6.4 oz (74.1 kg)  Height: 5\' 9"  (1.753 m)   Body mass index is 24.13 kg/m.  Physical Exam  GENERAL APPEARANCE: Well nourished. In no acute distress. Normal body habitus SKIN:  Skin is warm and dry. There are no suspicious lesions or rash HEAD: Normal in size and contour. No evidence of trauma EYES: Lids open and close normally. No blepharitis, entropion or ectropion. PERRL. Conjunctivae are clear and sclerae are white. Lenses are without opacity EARS: Pinnae are normal. Patient hears normal voice tunes of the examiner MOUTH and THROAT: Lips are without lesions. Oral mucosa is moist and without lesions. Tongue is normal in shape, size, and color and without lesions NECK: supple, trachea midline, no neck masses, no thyroid tenderness, no thyromegaly LYMPHATICS: No LAN in the neck, no supraclavicular LAN RESPIRATORY: Breathing is even & unlabored, BS CTAB CARDIAC: RRR, no murmur,no extra heart sounds, no edema GI: Abdomen soft, normal BS, no masses, no tenderness, no hepatomegaly, no splenomegaly MUSCULOSKELETAL: No deformities. Movement at each extremity is full and painless. Strength is 5/5 at each extremity. Back is without kyphosis or scoliosis CIRCULATION: Pedal pulses are 2+. There is no edema of the legs, ankles and feet NEUROLOGICAL: There is no tremor. Speech is clear PSYCHIATRIC: Alert and oriented X 3. Affect and behavior are appropriate  Labs reviewed: Recent Labs    09/06/21 0421 09/07/21 0859 09/08/21 0650 09/09/21 1517 09/10/21 0434  NA 133*  --   --  130* 131*  K 4.8  --   --  4.0 4.0  CL 102  --   --  102 101  CO2 25  --   --  21* 25   GLUCOSE 179*  --   --  209* 122*  BUN 22  --   --  37* 31*  CREATININE 1.03   < > 1.41* 1.31* 1.18  CALCIUM 8.5*  --   --  8.2* 8.0*   < > = values in this interval not displayed.   Recent Labs    12/30/20 1143 05/21/21 0825 08/21/21 1210  AST 41* 42* 39*  ALT 41 29 24  ALKPHOS  87 81 117  BILITOT 0.8 0.8 0.7  PROT 7.5 7.2 7.5  ALBUMIN 3.9 3.4* 3.8   Recent Labs    05/21/21 0825 06/19/21 0600 09/05/21 2310 09/06/21 0421 09/08/21 1008  WBC 6.4   < > 13.7* 13.3* 7.9  NEUTROABS 3.8  --  10.5*  --   --   HGB 10.3*   < > 9.1* 8.4* 8.6*  HCT 30.7*   < > 26.7* 24.8* 25.3*  MCV 104.4*   < > 102.7* 103.8* 103.3*  PLT PLATELET CLUMPS NOTED ON SMEAR, UNABLE TO ESTIMATE   < > 118* 110* 124*   < > = values in this interval not displayed.   Lab Results  Component Value Date   TSH 1.81 11/05/2013   Lab Results  Component Value Date   HGBA1C 6.0 (A) 08/21/2021   Lab Results  Component Value Date   CHOL 135 07/14/2021   HDL 56.40 07/14/2021   LDLCALC 64 07/14/2021   LDLDIRECT 103.9 11/05/2013   TRIG 76.0 07/14/2021   CHOLHDL 2 07/14/2021    Significant Diagnostic Results in last 30 days:  DG Elbow Complete Right  Result Date: 09/05/2021 CLINICAL DATA:  70 year old male status post falls. Pain. EXAM: RIGHT ELBOW - COMPLETE 3+ VIEW COMPARISON:  None. FINDINGS: No definite joint effusion. Preserved alignment at the elbow. Joint spaces are normal for age. Mild degenerative spurring. Bone mineralization is within normal limits. Radial head appears intact. No acute osseous abnormality identified. No discrete soft tissue injury. IMPRESSION: No acute fracture or dislocation identified about the right elbow. Electronically Signed   By: Genevie Ann M.D.   On: 09/05/2021 06:36   DG Knee Complete 4 Views Left  Result Date: 09/05/2021 CLINICAL DATA:  71 year old male status post falls. Pain. EXAM: LEFT KNEE - COMPLETE 4+ VIEW COMPARISON:  No prior left knee series. FINDINGS: No evidence of  joint effusion. Intact patella. Preserved alignment. Mild medial and lateral compartment joint degeneration, mild medial compartment joint space loss. No acute osseous abnormality identified. Small dystrophic calcification posterior to the knee. No discrete soft tissue injury. IMPRESSION: No acute fracture or dislocation identified about the left knee. Electronically Signed   By: Genevie Ann M.D.   On: 09/05/2021 06:37   DG Knee Complete 4 Views Right  Result Date: 09/05/2021 CLINICAL DATA:  70 year old male status post falls.  Pain. EXAM: RIGHT KNEE - COMPLETE 4+ VIEW COMPARISON:  Knee series 11/05/2013. FINDINGS: Anterior soft tissue swelling. No evidence of joint effusion. Intact patella. Preserved alignment. Chronic lateral compartment degenerative spurring. Mild joint space loss. No acute osseous abnormality identified. IMPRESSION: Anterior soft tissue swelling. No acute fracture or dislocation identified about the right knee. Electronically Signed   By: Genevie Ann M.D.   On: 09/05/2021 06:35   DG Hip Unilat W or Wo Pelvis 2-3 Views Right  Result Date: 09/05/2021 CLINICAL DATA:  70 year old male status post fall.  Pain. EXAM: DG HIP (WITH OR WITHOUT PELVIS) 2-3V RIGHT COMPARISON:  Pelvis radiograph 08/20/2019. FINDINGS: Pelvis appears stable and intact. Femoral heads remain normally located. Stable proximal left femur. Proximal right femur appears intact. No acute osseous abnormality identified. Small chronic dystrophic calcifications about the right hip. Negative visible lower abdominal and pelvic visceral contours. IMPRESSION: No acute fracture or dislocation identified about the right hip or pelvis. Electronically Signed   By: Genevie Ann M.D.   On: 09/05/2021 08:49   [place] EKG SITE RITE  Result Date: 09/08/2021 If Site Rite image not attached, placement  could not be confirmed due to current cardiac rhythm.   Assessment/Plan  1. Type 2 diabetes mellitus with stage 3a chronic kidney disease, with long-term  current use of insulin (HCC) -  will start Lantus 8 units SQ Q HS and decrease Novolog from 30 units BID to 10 units SQ BID for CBG >=250 -  monitor CBGs  2. Essential hypertension -  BPs stable, continue losartan and metoprolol tartrate  3. Staphylococcal arthritis of left knee (HCC) -Continue cefazolin IV -  follow up with infectious disease and orthopedics    Family/ staff Communication: Discussed plan of care with resident and charge nurse.  Labs/tests ordered:   None  Goals of care:   Short-term care   Durenda Age, DNP, MSN, FNP-BC [facility] [phone] (Monday-Friday 8:00 a.m. - 5:00 p.m.) [phone] (after hours)

## 2021-09-28 ENCOUNTER — Other Ambulatory Visit: Payer: Self-pay | Admitting: Adult Health

## 2021-09-28 DIAGNOSIS — Z792 Long term (current) use of antibiotics: Secondary | ICD-10-CM | POA: Diagnosis not present

## 2021-09-28 MED ORDER — TRAMADOL HCL 50 MG PO TABS: 50.0000 mg | ORAL_TABLET | Freq: Four times a day (QID) | ORAL | 0 refills | Status: DC | PRN

## 2021-10-05 DIAGNOSIS — Z792 Long term (current) use of antibiotics: Secondary | ICD-10-CM | POA: Diagnosis not present

## 2021-10-06 ENCOUNTER — Encounter: Payer: Medicare Other | Admitting: Internal Medicine

## 2021-10-07 ENCOUNTER — Encounter: Payer: Self-pay | Admitting: Adult Health

## 2021-10-07 ENCOUNTER — Non-Acute Institutional Stay (SKILLED_NURSING_FACILITY): Payer: Medicare Other | Admitting: Adult Health

## 2021-10-07 DIAGNOSIS — M11262 Other chondrocalcinosis, left knee: Secondary | ICD-10-CM

## 2021-10-07 DIAGNOSIS — E1122 Type 2 diabetes mellitus with diabetic chronic kidney disease: Secondary | ICD-10-CM | POA: Diagnosis not present

## 2021-10-07 DIAGNOSIS — D539 Nutritional anemia, unspecified: Secondary | ICD-10-CM

## 2021-10-07 DIAGNOSIS — M00062 Staphylococcal arthritis, left knee: Secondary | ICD-10-CM

## 2021-10-07 DIAGNOSIS — E785 Hyperlipidemia, unspecified: Secondary | ICD-10-CM

## 2021-10-07 DIAGNOSIS — Z794 Long term (current) use of insulin: Secondary | ICD-10-CM

## 2021-10-07 DIAGNOSIS — K219 Gastro-esophageal reflux disease without esophagitis: Secondary | ICD-10-CM

## 2021-10-07 DIAGNOSIS — I10 Essential (primary) hypertension: Secondary | ICD-10-CM | POA: Diagnosis not present

## 2021-10-07 DIAGNOSIS — K7030 Alcoholic cirrhosis of liver without ascites: Secondary | ICD-10-CM

## 2021-10-07 DIAGNOSIS — F339 Major depressive disorder, recurrent, unspecified: Secondary | ICD-10-CM

## 2021-10-07 DIAGNOSIS — N1831 Chronic kidney disease, stage 3a: Secondary | ICD-10-CM

## 2021-10-07 NOTE — Progress Notes (Signed)
Location:  [name] Room Number: [place] of Service:  SNF (31) Provider:  Durenda Age, DNP, FNP-BC  Patient Care Team: [name], MD as PCP - Cyndia Diver, MD as PCP - Cardiology (Cardiology) Debbora Dus, [facility] as Pharmacist (Pharmacist)  Extended Emergency Contact Information Primary Emergency Contact: [place] Phone: [phone] Relation: Brother  Code Status:  FULL CODE  Goals of care: Advanced Directive information Advanced Directives 10/07/2021  Does Patient Have a Medical Advance Directive? No  Would patient like information on creating a medical advance directive? No - Patient declined     Chief Complaint  Patient presents with   Discharge Note    For discharge home on 10/08/21    HPI:  Pt is a 70 y.o. male who is for discharge home on 10/08/21 with Home health PT and OT.  He was admitted to [facility] on 09/10/2021 post hospital admission 09/05/2021 to 09/10/2021.  He has a PMH of alcoholic cirrhosis, insulin-dependent type 2 diabetes mellitus, hypertension, CAD, hyperlipidemia and depression.  He presented to the ED on 09/05/2021 with knee and hip pain following a fall at home.  X-rays were negative and was sent home with Keflex for cellulitis of the right knee.  He then reported having another fall at home and presented to the ED again.  He reported having recurrent weekly falls since about 8 months ago due to weakness of his legs and neuropathy.  He had a recent microdiscectomy back in July.   In the ED, right knee x-ray showed anterior soft tissue swelling, negative for left knee fracture.  There was significant edema of the left knee so aspiration was done in the ED showing calcium pyrophosphate crystals and significantly elevated WBC of > 140,000 concerning for septic arthritis.  He was admitted to the hospital for suspected left knee septic arthritis.  On 10/10, infectious  disease was consulted due to fluid left knee growing MRSA, 1) left knee arthroscopy with irrigation and debridement for sepsis was done, 2) arthroscopic partial medial meniscectomy was performed.  PICC line was inserted on 09/08/2021.  Cefazolin IV was a started on 09/09/2021.  He completed Cefazolin therapy and PICC line was discontinued on 11/07/2, then started on Keflex 500 mg QID X 14 days.  Patient was admitted to this facility for short-term rehabilitation after the patient's recent hospitalization.  Patient has completed SNF rehabilitation and therapy has cleared the patient for discharge.   Past Medical History:  Diagnosis Date   Allergy    Anemia    Arthritis    Bilateral hands and feet   CAD (coronary artery disease)    s/p bifurcation stenting (DES x2) and stenting bifurcation lesion distal RCA (1 DES)   Cataract    Cirrhosis (HCC)    Depression    Diabetes mellitus    Esophageal varices (HCC)    GERD (gastroesophageal reflux disease)    Hyperlipemia    Hypertension    Myocardial infarction ([place])    Sleep apnea    CPAP   Past Surgical History:  Procedure Laterality Date   BIOPSY  09/01/2018   Procedure: BIOPSY;  Surgeon: Mauri Pole, MD;  [facility];  Service: Endoscopy;;   CATARACT EXTRACTION Bilateral    CORONARY ANGIOPLASTY WITH STENT PLACEMENT   March 2010   Dr. Olevia Perches   ESOPHAGOGASTRODUODENOSCOPY (EGD) WITH PROPOFOL N/A 09/01/2018   Procedure: ESOPHAGOGASTRODUODENOSCOPY (EGD) WITH PROPOFOL;  Surgeon: Mauri Pole, MD;  Location:  [facility];  Service: Endoscopy;  Laterality: N/A;   KNEE ARTHROSCOPY Left 09/07/2021   Procedure: IRRIGATION AND DEBRIDEMENT OF LEFT KNEE; ARTHROSCOPY OF LEFT KNEE, PARTIAL MEDIAL [place].;  Surgeon: [name], MD;  Location: [facility]S;  Service: Orthopedics;  Laterality: Left;   LEFT HEART CATH AND CORONARY ANGIOGRAPHY N/A 08/31/2018   Procedure: LEFT HEART CATH AND CORONARY ANGIOGRAPHY;  Surgeon:  Sherren Mocha, MD;  Location: [place] [facility];  Service: Cardiovascular;  Laterality: N/A;   THORACIC DISCECTOMY Right 06/19/2021   Procedure: Microdiscectomy - right - Thoracic Eleven-Thoracic Twelve;  Surgeon: Earnie Larsson, MD;  Location: [place];  Service: Neurosurgery;  Laterality: Right;   TONSILLECTOMY  1959    No Known Allergies  Outpatient Encounter Medications as of 10/07/2021  Medication Sig   Alpha Lipoic Acid 200 MG CAPS Take 1 capsule by mouth daily.   Ascorbic Acid (VITAMIN C) 1000 MG tablet Take 1,000 mg by mouth daily.   aspirin EC 81 MG EC tablet Take 1 tablet (81 mg total) by mouth daily. Swallow whole.   cephALEXin (KEFLEX) 500 MG capsule Take 500 mg by mouth 4 (four) times daily.   Coenzyme Q10 (CO Q 10) 100 MG CAPS Take 100 mg by mouth daily.   colchicine 0.6 MG tablet Take 0.6 mg by mouth daily.   feeding supplement, GLUCERNA SHAKE, (GLUCERNA SHAKE) LIQD Take 237 mLs by mouth daily.   ferrous sulfate 325 (65 FE) MG tablet Take 325 mg by mouth daily with breakfast.   furosemide (LASIX) 20 MG tablet Take 1 tablet by mouth once daily   insulin aspart (NOVOLOG FLEXPEN) 100 UNIT/ML FlexPen Inject 10 Units into the skin 2 (two) times daily. For CBG greater than or equal to 250   insulin glargine (LANTUS) 100 UNIT/ML injection Inject 8 Units into the skin at bedtime.   losartan (COZAAR) 50 MG tablet Take 1 tablet (50 mg total) by mouth daily. Please make yearly appt with Dr. Burt Knack for October 2022 for future refills. Thank you 1st attempt   metoprolol tartrate (LOPRESSOR) 50 MG tablet Take 50 mg by mouth 2 (two) times daily.   Multiple Vitamin (MULTIVITAMIN WITH MINERALS) TABS tablet Take 1 tablet by mouth at bedtime.   pantoprazole (PROTONIX) 40 MG tablet Take 1 tablet by mouth twice daily   PARoxetine (PAXIL) 40 MG tablet Take 1 tablet (40 mg total) by mouth every morning.   Polyethyl Glycol-Propyl Glycol (SYSTANE) 0.4-0.3 % SOLN Place 2 drops into both eyes daily.    rosuvastatin (CRESTOR) 10 MG tablet Take 1 tablet by mouth once daily   spironolactone (ALDACTONE) 50 MG tablet Take 1 tablet by mouth once daily   traMADol (ULTRAM) 50 MG tablet Take 1 tablet (50 mg total) by mouth every 6 (six) hours as needed.   triamcinolone cream (KENALOG) 0.1 % APPLY TOPICALLY TO SCALP TWO TIMES DAILY AS NEEDED   Continuous Blood Gluc Receiver (FREESTYLE LIBRE 2 READER) DEVI Use as directed (Patient not taking: No sig reported)   Continuous Blood Gluc Sensor (FREESTYLE LIBRE 2 SENSOR) MISC Use as directed (Patient not taking: No sig reported)   glucose blood (ONE TOUCH ULTRA TEST) test strip Use to test blood sugar 2 times daily as instructed. Dx: E11.59, E11.65 (Patient not taking: No sig reported)   Insulin Pen Needle 32G X 4 MM MISC Use 4x a day - with insulin   Insulin Syringe-Needle U-100 (RELION INSULIN SYRINGE) 31G X 15/64" 0.5 ML MISC USE  TWICE DAILY (Patient not  taking: No sig reported)   ONETOUCH DELICA LANCETS FINE MISC Use to test blood sugar 2 times daily as instructed. Dx: E11.59, E11.65 (Patient not taking: No sig reported)   No facility-administered encounter medications on file as of 10/07/2021.    Review of Systems  GENERAL: No change in appetite, no fatigue, no weight changes, no fever or chills  MOUTH and THROAT: Denies oral discomfort, gingival pain or bleeding RESPIRATORY: no cough, SOB, DOE, wheezing, hemoptysis CARDIAC: No chest pain, edema or palpitations GI: No abdominal pain, diarrhea, constipation, heart burn, nausea or vomiting GU: Denies dysuria, frequency, hematuria, incontinence, or discharge NEUROLOGICAL: Denies dizziness, syncope, numbness, or headache PSYCHIATRIC: Denies feelings of depression or anxiety. No report of hallucinations, insomnia, paranoia, or agitation   Immunization History  Administered Date(s) Administered   Fluad Quad(high Dose 65+) 08/07/2019, 10/02/2020, 08/21/2021   Hep A / Hep B 04/02/2020, 04/09/2020,  05/01/2020   Influenza Split 08/30/2011, 09/18/2012   Influenza Whole 08/21/2008, 10/02/2009, 08/18/2010   Influenza, High Dose Seasonal PF 08/29/2018   Influenza,inj,Quad PF,6+ Mos 11/05/2013, 09/12/2015, 08/27/2016, 09/02/2017   Influenza-Unspecified 11/05/2013, 09/12/2015, 08/27/2016, 09/02/2017   PFIZER Comirnaty(Gray Top)Covid-19 Tri-Sucrose Vaccine 01/28/2020, 02/28/2020   PFIZER(Purple Top)SARS-COV-2 Vaccination 01/28/2020, 02/28/2020   Pneumococcal Conjugate-13 10/09/2014   Pneumococcal Polysaccharide-23 02/11/2010, 01/18/2017   Td 11/29/2002   Tdap 11/05/2013   Pertinent  Health Maintenance Due  Topic Date Due   FOOT EXAM  11/28/2019   OPHTHALMOLOGY EXAM  12/23/2020   HEMOGLOBIN A1C  02/18/2022   COLONOSCOPY (Pts 45-68yrs Insurance coverage will need to be confirmed)  01/24/2028   INFLUENZA VACCINE  Completed   Fall Risk 09/08/2021 09/08/2021 09/09/2021 09/09/2021 09/10/2021  Falls in the past year? - - - - -  Was there an injury with Fall? - - - - -  Fall Risk Category Calculator - - - - -  Fall Risk Category - - - - -  Patient Fall Risk Level High fall risk High fall risk High fall risk High fall risk High fall risk  Fall risk Follow up - - - - -     Vitals:   10/07/21 1500  BP: 134/84  Pulse: 60  Resp: 20  Temp: 97.8 F (36.6 C)  Weight: 163 lb 12.8 oz (74.3 kg)  Height: 5\' 9"  (1.753 m)   Body mass index is 24.19 kg/m.  Physical Exam  GENERAL APPEARANCE: Well nourished. In no acute distress. Normal body habitus SKIN:  Skin is warm and dry.  MOUTH and THROAT: Lips are without lesions. Oral mucosa is moist and without lesions.  RESPIRATORY: Breathing is even & unlabored, BS CTAB CARDIAC: RRR, no murmur,no extra heart sounds, no edema GI: Abdomen soft, normal BS, no masses, no tenderness EXTREMITIES:  Able to move X 4 extremities NEUROLOGICAL: There is no tremor. Speech is clear. Alert and oriented X 3. PSYCHIATRIC:  Affect and behavior are  appropriate  Labs reviewed: Recent Labs    09/06/21 0421 09/07/21 0859 09/09/21 1517 09/10/21 0434 09/15/21 0000 09/17/21 0000 09/22/21 0000  NA 133*  --  130* 131*  --  138 139  K 4.8  --  4.0 4.0  --  4.3 4.6  CL 102  --  102 101  --  105 107  CO2 25  --  21* 25  --  26* 24*  GLUCOSE 179*  --  209* 122*  --   --   --   BUN 22  --  37* 31*  --  24*  21  CREATININE 1.03   < > 1.31* 1.18 1.3 1.1 1.3  CALCIUM 8.5*  --  8.2* 8.0*  --  8.9 8.5*   < > = values in this interval not displayed.   Recent Labs    12/30/20 1143 05/21/21 0825 08/21/21 1210  AST 41* 42* 39*  ALT 41 29 24  ALKPHOS 87 81 117  BILITOT 0.8 0.8 0.7  PROT 7.5 7.2 7.5  ALBUMIN 3.9 3.4* 3.8   Recent Labs    05/21/21 0825 06/19/21 0600 09/05/21 2310 09/06/21 0421 09/08/21 1008 09/15/21 0000 09/22/21 0000  WBC 6.4   < > 13.7* 13.3* 7.9 9.1 6.1  NEUTROABS 3.8  --  10.5*  --   --   --   --   HGB 10.3*   < > 9.1* 8.4* 8.6* 9.5* 8.7*  HCT 30.7*   < > 26.7* 24.8* 25.3* 28* 26*  MCV 104.4*   < > 102.7* 103.8* 103.3*  --   --   PLT PLATELET CLUMPS NOTED ON SMEAR, UNABLE TO ESTIMATE   < > 118* 110* 124* 211 185   < > = values in this interval not displayed.   Lab Results  Component Value Date   TSH 1.81 11/05/2013   Lab Results  Component Value Date   HGBA1C 6.0 (A) 08/21/2021   Lab Results  Component Value Date   CHOL 135 07/14/2021   HDL 56.40 07/14/2021   LDLCALC 64 07/14/2021   LDLDIRECT 103.9 11/05/2013   TRIG 76.0 07/14/2021   CHOLHDL 2 07/14/2021    Significant Diagnostic Results in last 30 days:  [place] EKG SITE RITE  Result Date: 09/08/2021 If Site Rite image not attached, placement could not be confirmed due to current cardiac rhythm.   Assessment/Plan  1. Staphylococcal arthritis of left knee (HCC) -   follow up with infectious disease and orthopedics - aspirin 81 MG EC tablet; Take 1 tablet (81 mg total) by mouth daily. Swallow whole.  Dispense: 30 tablet; Refill: 0 - traMADol  (ULTRAM) 50 MG tablet; Take 1 tablet (50 mg total) by mouth every 6 (six) hours as needed.  Dispense: 20 tablet; Refill: 0 - cephALEXin (KEFLEX) 500 MG capsule; Take 1 capsule (500 mg total) by mouth 4 (four) times daily for 12 days.  Dispense: 48 capsule; Refill: 0  2. Pseudogout of knee, left - colchicine 0.6 MG tablet; Take 1 tablet (0.6 mg total) by mouth daily.  Dispense: 30 tablet; Refill: 0  3. Type 2 diabetes mellitus with stage 3a chronic kidney disease, with long-term current use of insulin (HCC) - insulin aspart (NOVOLOG FLEXPEN) 100 UNIT/ML FlexPen; Inject 10 Units into the skin 2 (two) times daily. For CBG greater than or equal to 250  Dispense: 15 mL; Refill: 0 - insulin glargine (LANTUS) 100 UNIT/ML injection; Inject 0.12 mLs (12 Units total) into the skin at bedtime.  Dispense: 10 mL; Refill: 0  4. Essential hypertension - losartan (COZAAR) 50 MG tablet; Take 1 tablet (50 mg total) by mouth daily. Please make yearly appt with Dr. Burt Knack for October 2022 for future refills. Thank you 1st attempt  Dispense: 30 tablet; Refill: 0 - metoprolol tartrate (LOPRESSOR) 50 MG tablet; Take 1 tablet (50 mg total) by mouth 2 (two) times daily.  Dispense: 30 tablet; Refill: 0  5. Macrocytic anemia - ferrous sulfate 325 (65 FE) MG tablet; Take 1 tablet (325 mg total) by mouth daily with breakfast.  Dispense: 30 tablet; Refill: 0  6. Major  depression, recurrent, chronic (HCC) - Paroxetine (PAXIL) 40 MG tablet; Take 1 tablet (40 mg total) by mouth every morning.  Dispense: 30 tablet; Refill: 0  7. Gastroesophageal reflux disease without esophagitis - pantoprazole (PROTONIX) 40 MG tablet; Take 1 tablet (40 mg total) by mouth 2 (two) times daily.  Dispense: 60 tablet; Refill: 0  8. Alcoholic cirrhosis of liver without ascites (HCC) - furosemide (LASIX) 20 MG tablet; Take 1 tablet (20 mg total) by mouth daily.  Dispense: 30 tablet; Refill: 0 - spironolactone (ALDACTONE) 50 MG tablet; Take 1  tablet (50 mg total) by mouth daily.  Dispense: 30 tablet; Refill: 0  9. Hyperlipidemia LDL goal <70 - rosuvastatin (CRESTOR) 10 MG tablet; Take 1 tablet (10 mg total) by mouth daily.  Dispense: 30 tablet; Refill: 0     I have filled out patient's discharge paperwork and e-prescribed medications.  Patient will receive home health PT and OT.  DME provided:  None  Total discharge time: Greater than 30 minutes Greater than 50% was spent in counseling and coordination of care.   Discharge time involved coordination of the discharge process with social worker, nursing staff and therapy department. Medical justification for home health services verified.    Durenda Age, DNP, MSN, FNP-BC [facility] [phone] (Monday-Friday 8:00 a.m. - 5:00 p.m.) [phone] (after hours)

## 2021-10-08 ENCOUNTER — Encounter: Payer: Self-pay | Admitting: Adult Health

## 2021-10-08 MED ORDER — METOPROLOL TARTRATE 50 MG PO TABS: 50.0000 mg | ORAL_TABLET | Freq: Two times a day (BID) | ORAL | 0 refills | Status: DC

## 2021-10-08 MED ORDER — LOSARTAN POTASSIUM 50 MG PO TABS: 50.0000 mg | ORAL_TABLET | Freq: Every day | ORAL | 0 refills | Status: DC

## 2021-10-08 MED ORDER — TRAMADOL HCL 50 MG PO TABS: 50.0000 mg | ORAL_TABLET | Freq: Four times a day (QID) | ORAL | 0 refills | Status: DC | PRN

## 2021-10-08 MED ORDER — FERROUS SULFATE 325 (65 FE) MG PO TABS: 325.0000 mg | ORAL_TABLET | Freq: Every day | ORAL | 0 refills | Status: AC

## 2021-10-08 MED ORDER — COLCHICINE 0.6 MG PO TABS: 0.6000 mg | ORAL_TABLET | Freq: Every day | ORAL | 0 refills | Status: AC

## 2021-10-08 MED ORDER — FUROSEMIDE 20 MG PO TABS: 20.0000 mg | ORAL_TABLET | Freq: Every day | ORAL | 0 refills | Status: DC

## 2021-10-08 MED ORDER — INSULIN GLARGINE 100 UNIT/ML ~~LOC~~ SOLN: 12.0000 [IU] | Freq: Every day | SUBCUTANEOUS | 0 refills | Status: DC

## 2021-10-08 MED ORDER — PANTOPRAZOLE SODIUM 40 MG PO TBEC: 40.0000 mg | DELAYED_RELEASE_TABLET | Freq: Two times a day (BID) | ORAL | 0 refills | Status: DC

## 2021-10-08 MED ORDER — CEPHALEXIN 500 MG PO CAPS: 500.0000 mg | ORAL_CAPSULE | Freq: Four times a day (QID) | ORAL | 0 refills | Status: AC

## 2021-10-08 MED ORDER — ASPIRIN 81 MG PO TBEC: 81.0000 mg | DELAYED_RELEASE_TABLET | Freq: Every day | ORAL | 0 refills | Status: DC

## 2021-10-08 MED ORDER — TRIAMCINOLONE ACETONIDE 0.1 % EX CREA: TOPICAL_CREAM | CUTANEOUS | 0 refills | Status: DC

## 2021-10-08 MED ORDER — SPIRONOLACTONE 50 MG PO TABS
50.0000 mg | ORAL_TABLET | Freq: Every day | ORAL | 0 refills | Status: DC
Start: 2021-10-07 — End: 2021-12-18

## 2021-10-08 MED ORDER — NOVOLOG FLEXPEN 100 UNIT/ML ~~LOC~~ SOPN: 10.0000 [IU] | PEN_INJECTOR | Freq: Two times a day (BID) | SUBCUTANEOUS | 0 refills | Status: DC

## 2021-10-08 MED ORDER — ROSUVASTATIN CALCIUM 10 MG PO TABS: 10.0000 mg | ORAL_TABLET | Freq: Every day | ORAL | 0 refills | Status: DC

## 2021-10-08 MED ORDER — PAROXETINE HCL 40 MG PO TABS: 40.0000 mg | ORAL_TABLET | ORAL | 0 refills | Status: DC

## 2021-10-13 DIAGNOSIS — D696 Thrombocytopenia, unspecified: Secondary | ICD-10-CM | POA: Diagnosis not present

## 2021-10-13 DIAGNOSIS — K7031 Alcoholic cirrhosis of liver with ascites: Secondary | ICD-10-CM | POA: Diagnosis not present

## 2021-10-13 DIAGNOSIS — I1310 Hypertensive heart and chronic kidney disease without heart failure, with stage 1 through stage 4 chronic kidney disease, or unspecified chronic kidney disease: Secondary | ICD-10-CM | POA: Diagnosis not present

## 2021-10-13 DIAGNOSIS — M11262 Other chondrocalcinosis, left knee: Secondary | ICD-10-CM | POA: Diagnosis not present

## 2021-10-13 DIAGNOSIS — N1831 Chronic kidney disease, stage 3a: Secondary | ICD-10-CM | POA: Diagnosis not present

## 2021-10-13 DIAGNOSIS — E1122 Type 2 diabetes mellitus with diabetic chronic kidney disease: Secondary | ICD-10-CM | POA: Diagnosis not present

## 2021-10-13 DIAGNOSIS — F339 Major depressive disorder, recurrent, unspecified: Secondary | ICD-10-CM | POA: Diagnosis not present

## 2021-10-13 DIAGNOSIS — D631 Anemia in chronic kidney disease: Secondary | ICD-10-CM | POA: Diagnosis not present

## 2021-10-13 DIAGNOSIS — E1140 Type 2 diabetes mellitus with diabetic neuropathy, unspecified: Secondary | ICD-10-CM | POA: Diagnosis not present

## 2021-10-13 DIAGNOSIS — I2510 Atherosclerotic heart disease of native coronary artery without angina pectoris: Secondary | ICD-10-CM | POA: Diagnosis not present

## 2021-10-13 DIAGNOSIS — L03115 Cellulitis of right lower limb: Secondary | ICD-10-CM | POA: Diagnosis not present

## 2021-10-13 DIAGNOSIS — I8500 Esophageal varices without bleeding: Secondary | ICD-10-CM | POA: Diagnosis not present

## 2021-10-13 DIAGNOSIS — B9562 Methicillin resistant Staphylococcus aureus infection as the cause of diseases classified elsewhere: Secondary | ICD-10-CM | POA: Diagnosis not present

## 2021-10-13 DIAGNOSIS — M00062 Staphylococcal arthritis, left knee: Secondary | ICD-10-CM | POA: Diagnosis not present

## 2021-10-13 DIAGNOSIS — D539 Nutritional anemia, unspecified: Secondary | ICD-10-CM | POA: Diagnosis not present

## 2021-10-13 DIAGNOSIS — M19041 Primary osteoarthritis, right hand: Secondary | ICD-10-CM | POA: Diagnosis not present

## 2021-10-16 ENCOUNTER — Other Ambulatory Visit: Payer: Self-pay

## 2021-10-16 ENCOUNTER — Ambulatory Visit (INDEPENDENT_AMBULATORY_CARE_PROVIDER_SITE_OTHER): Payer: Medicare Other | Admitting: Internal Medicine

## 2021-10-16 ENCOUNTER — Encounter: Payer: Self-pay | Admitting: Internal Medicine

## 2021-10-16 VITALS — BP 92/60 | HR 60 | Temp 97.6°F | Ht 69.0 in | Wt 162.0 lb

## 2021-10-16 DIAGNOSIS — E119 Type 2 diabetes mellitus without complications: Secondary | ICD-10-CM

## 2021-10-16 DIAGNOSIS — M11262 Other chondrocalcinosis, left knee: Secondary | ICD-10-CM | POA: Diagnosis not present

## 2021-10-16 DIAGNOSIS — Z794 Long term (current) use of insulin: Secondary | ICD-10-CM | POA: Diagnosis not present

## 2021-10-16 DIAGNOSIS — M00062 Staphylococcal arthritis, left knee: Secondary | ICD-10-CM | POA: Diagnosis not present

## 2021-10-16 LAB — CBC
HCT: 31.4 % — ABNORMAL LOW (ref 39.0–52.0)
Hemoglobin: 10.2 g/dL — ABNORMAL LOW (ref 13.0–17.0)
MCHC: 32.5 g/dL (ref 30.0–36.0)
MCV: 97.3 fl (ref 78.0–100.0)
Platelets: 153 10*3/uL (ref 150.0–400.0)
RBC: 3.23 Mil/uL — ABNORMAL LOW (ref 4.22–5.81)
RDW: 19.2 % — ABNORMAL HIGH (ref 11.5–15.5)
WBC: 7.2 10*3/uL (ref 4.0–10.5)

## 2021-10-16 LAB — HEPATIC FUNCTION PANEL
ALT: 18 U/L (ref 0–53)
AST: 39 U/L — ABNORMAL HIGH (ref 0–37)
Albumin: 3.3 g/dL — ABNORMAL LOW (ref 3.5–5.2)
Alkaline Phosphatase: 99 U/L (ref 39–117)
Bilirubin, Direct: 0.2 mg/dL (ref 0.0–0.3)
Total Bilirubin: 0.8 mg/dL (ref 0.2–1.2)
Total Protein: 7.4 g/dL (ref 6.0–8.3)

## 2021-10-16 LAB — RENAL FUNCTION PANEL
Albumin: 3.3 g/dL — ABNORMAL LOW (ref 3.5–5.2)
BUN: 25 mg/dL — ABNORMAL HIGH (ref 6–23)
CO2: 26 mEq/L (ref 19–32)
Calcium: 9.2 mg/dL (ref 8.4–10.5)
Chloride: 99 mEq/L (ref 96–112)
Creatinine, Ser: 1.33 mg/dL (ref 0.40–1.50)
GFR: 53.38 mL/min — ABNORMAL LOW (ref >60.00)
Glucose, Bld: 113 mg/dL — ABNORMAL HIGH (ref 70–99)
Phosphorus: 3.8 mg/dL (ref 2.3–4.6)
Potassium: 4.8 mEq/L (ref 3.5–5.1)
Sodium: 135 mEq/L (ref 135–145)

## 2021-10-16 LAB — SEDIMENTATION RATE: Sed Rate: 52 mm/hr — ABNORMAL HIGH (ref 0–20)

## 2021-10-16 NOTE — Patient Instructions (Signed)
Stop lantus. Use humalog only if your sugars are too high. Continue the novolin N twice a day and monitor your sugars.

## 2021-10-16 NOTE — Progress Notes (Signed)
Subjective:    Patient ID: David [name], male    DOB: [birth year], 70 y.o.   MRN: [id number]  HPI Here for hospital follow up This visit occurred during the SARS-CoV-2 public health emergency.  Safety protocols were in place, including screening questions prior to the visit, additional usage of staff PPE, and extensive cleaning of exam room while observing appropriate contact time as indicated for disinfecting solutions.   Hurt left knee about 2 weeks after my last visit Damaged knee---meniscus tear? Fluid in knee--then drained (had infection) Hospitalized after septic joint recognized Staph aureus found----arthroscopy to clean out and meniscus removal Now in immobilizer IV cephazolin---- then to rehab for 5 weeks  Home  8 days ago Walking with walker Some pain in knee still Continues on oral cephalexin---almost done (4 more days) Independent at home----but has PT/OT by Alvis Lemmings  Still drinking some---every other day  3-4 beers at a time  Sugars better but were still high at rehab [facility]) Metformin stopped On lantus --only 14 daily Restarted novolin N---30AM, 35-40 evening Has humalog for prn use when very high  No chest pain No SOB  Current Outpatient Medications on File Prior to Visit  Medication Sig Dispense Refill   Alpha Lipoic Acid 200 MG CAPS Take 1 capsule by mouth daily.     Ascorbic Acid (VITAMIN C) 1000 MG tablet Take 1,000 mg by mouth daily.     cephALEXin (KEFLEX) 500 MG capsule Take 1 capsule (500 mg total) by mouth 4 (four) times daily for 12 days. 48 capsule 0   Coenzyme Q10 (CO Q 10) 100 MG CAPS Take 100 mg by mouth daily.     colchicine 0.6 MG tablet Take 1 tablet (0.6 mg total) by mouth daily. 30 tablet 0   ferrous sulfate 325 (65 FE) MG tablet Take 1 tablet (325 mg total) by mouth daily with breakfast. 30 tablet 0   furosemide (LASIX) 20 MG tablet Take 1 tablet (20 mg total) by mouth daily. 30 tablet 0   insulin aspart (NOVOLOG FLEXPEN) 100 UNIT/ML  FlexPen Inject 10 Units into the skin 2 (two) times daily. For CBG greater than or equal to 250 15 mL 0   insulin NPH Human (NOVOLIN N) 100 UNIT/ML injection Inject 30-40 Units into the skin 2 (two) times daily before a meal.     Insulin Pen Needle 32G X 4 MM MISC Use 4x a day - with insulin 200 each 3   Insulin Syringe-Needle U-100 (RELION INSULIN SYRINGE) 31G X 15/64" 0.5 ML MISC USE  TWICE DAILY 100 each 5   metoprolol tartrate (LOPRESSOR) 50 MG tablet Take 1 tablet (50 mg total) by mouth 2 (two) times daily. 30 tablet 0   Multiple Vitamin (MULTIVITAMIN WITH MINERALS) TABS tablet Take 1 tablet by mouth at bedtime.     pantoprazole (PROTONIX) 40 MG tablet Take 1 tablet (40 mg total) by mouth 2 (two) times daily. 60 tablet 0   PARoxetine (PAXIL) 40 MG tablet Take 1 tablet (40 mg total) by mouth every morning. 30 tablet 0   Polyethyl Glycol-Propyl Glycol (SYSTANE) 0.4-0.3 % SOLN Place 2 drops into both eyes daily.     rosuvastatin (CRESTOR) 10 MG tablet Take 1 tablet (10 mg total) by mouth daily. 30 tablet 0   spironolactone (ALDACTONE) 50 MG tablet Take 1 tablet (50 mg total) by mouth daily. 30 tablet 0   traMADol (ULTRAM) 50 MG tablet Take 1 tablet (50 mg total) by mouth every 6 (six) hours as  needed. 20 tablet 0   triamcinolone cream (KENALOG) 0.1 % APPLY TOPICALLY TO SCALP TWO TIMES DAILY AS NEEDED 30 g 0   glucose blood (ONE TOUCH ULTRA TEST) test strip Use to test blood sugar 2 times daily as instructed. Dx: E11.59, E11.65 (Patient not taking: Reported on 09/15/2021) 100 each 5   ONETOUCH DELICA LANCETS FINE MISC Use to test blood sugar 2 times daily as instructed. Dx: E11.59, E11.65 (Patient not taking: Reported on 10/16/2021) 100 each 5   No current facility-administered medications on file prior to visit.    No Known Allergies  Past Medical History:  Diagnosis Date   Allergy    Anemia    Arthritis    Bilateral hands and feet   CAD (coronary artery disease)    s/p bifurcation  stenting (DES x2) and stenting bifurcation lesion distal RCA (1 DES)   Cataract    Cirrhosis (HCC)    Depression    Diabetes mellitus    Esophageal varices (HCC)    GERD (gastroesophageal reflux disease)    Hyperlipemia    Hypertension    Myocardial infarction ([place])    Sleep apnea    CPAP    Past Surgical History:  Procedure Laterality Date   BIOPSY  09/01/2018   Procedure: BIOPSY;  Surgeon: Mauri Pole, MD;  [facility];  Service: Endoscopy;;   CATARACT EXTRACTION Bilateral    CORONARY ANGIOPLASTY WITH STENT PLACEMENT   March 2010   Dr. Olevia Perches   ESOPHAGOGASTRODUODENOSCOPY (EGD) WITH PROPOFOL N/A 09/01/2018   Procedure: ESOPHAGOGASTRODUODENOSCOPY (EGD) WITH PROPOFOL;  Surgeon: Mauri Pole, MD;  Location: [facility];  Service: Endoscopy;  Laterality: N/A;   KNEE ARTHROSCOPY Left 09/07/2021   Procedure: IRRIGATION AND DEBRIDEMENT OF LEFT KNEE; ARTHROSCOPY OF LEFT KNEE, PARTIAL MEDIAL [place].;  Surgeon: [name], MD;  Location: [facility] ORS;  Service: Orthopedics;  Laterality: Left;   LEFT HEART CATH AND CORONARY ANGIOGRAPHY N/A 08/31/2018   Procedure: LEFT HEART CATH AND CORONARY ANGIOGRAPHY;  Surgeon: Sherren Mocha, MD;  Location: [place] [facility];  Service: Cardiovascular;  Laterality: N/A;   THORACIC DISCECTOMY Right 06/19/2021   Procedure: Microdiscectomy - right - Thoracic Eleven-Thoracic Twelve;  Surgeon: Earnie Larsson, MD;  Location: [place];  Service: Neurosurgery;  Laterality: Right;   TONSILLECTOMY  1959    Family History  Problem Relation Age of Onset   Heart failure Father        Deceased 21 y/o   Diabetes Father    Cancer Father        Prostate   Heart failure Mother        Deceased 57 y/o   Alcohol abuse Mother    Schizophrenia Son        Paranoid   Uveitis Son    Cancer Brother        prostate   Colon cancer Neg Hx    Esophageal cancer Neg Hx    Stomach cancer Neg Hx    Liver cancer Neg Hx    Pancreatic cancer Neg Hx     Rectal cancer Neg Hx     Social History   Socioeconomic History   Marital status: Divorced    Spouse name: Not on file   Number of children: 2   Years of education: Not on file   Highest education level: Not on file  Occupational History   Occupation: Patent examiner buildings    Comment: site closed   Occupation: Engineering geologist    Comment:  part time    Comment: Part time work  Tobacco Use   Smoking status: Former    Types: Cigarettes    Quit date: 11/30/1983    Years since quitting: 37.9   Smokeless tobacco: Never  Vaping Use   Vaping Use: Never used  Substance and Sexual Activity   Alcohol use: Yes    Alcohol/week: 20.0 standard drinks    Types: 20 Cans of beer per week    Comment: per pt 10-15 beers/week (as of 06/18/21)   Drug use: No   Sexual activity: Not Currently  Other Topics Concern   Not on file  Social History Narrative   No living will   Would want want son [name], as health care POA   Would accept resuscitation   Not sure about tube feeds--but doesn't want if cognitively unaware   Social Determinants of Health   Financial Resource Strain: Not on file  Food Insecurity: Not on file  Transportation Needs: Not on file  Physical Activity: Not on file  Stress: Not on file  Social Connections: Not on file  Intimate Partner Violence: Not on file   Review of Systems Rash (apparent vasculitis) has resolved Still not eating great--hard to do shopping (take out mostly) Not sleeping well     Objective:   Physical Exam Constitutional:      Appearance: Normal appearance.  Cardiovascular:     Rate and Rhythm: Normal rate and regular rhythm.     Heart sounds: No murmur heard.   No gallop.  Pulmonary:     Effort: Pulmonary effort is normal.     Breath sounds: Normal breath sounds. No wheezing or rales.  Abdominal:     Palpations: Abdomen is soft.     Tenderness: There is no abdominal tenderness.  Musculoskeletal:     Cervical back: Neck supple.      Right lower leg: No edema.     Left lower leg: No edema.     Comments: No effusion left knee  Skin:    Comments: No open areas at left knee Small granulating ulcer right 2nd toe  Neurological:     Mental Status: He is alert.           Assessment & Plan:

## 2021-10-16 NOTE — Assessment & Plan Note (Signed)
Off metformin Insulin changed at rehab---will go back to bid novolin N Sugars in mid 100's without hypoglycemia Rapid insulin prn only if very high

## 2021-10-16 NOTE — Assessment & Plan Note (Signed)
Continues on the gout

## 2021-10-16 NOTE — Assessment & Plan Note (Signed)
Finishing out the cephazolin with oral therapy Left knee looks good now Still in immobilizer due to the meniscus repair

## 2021-10-19 DIAGNOSIS — M00062 Staphylococcal arthritis, left knee: Secondary | ICD-10-CM | POA: Diagnosis not present

## 2021-10-19 DIAGNOSIS — Z792 Long term (current) use of antibiotics: Secondary | ICD-10-CM | POA: Diagnosis not present

## 2021-10-21 DIAGNOSIS — Z4889 Encounter for other specified surgical aftercare: Secondary | ICD-10-CM | POA: Diagnosis not present

## 2021-10-26 DIAGNOSIS — H348122 Central retinal vein occlusion, left eye, stable: Secondary | ICD-10-CM | POA: Diagnosis not present

## 2021-10-26 DIAGNOSIS — H3562 Retinal hemorrhage, left eye: Secondary | ICD-10-CM | POA: Diagnosis not present

## 2021-10-26 DIAGNOSIS — H43823 Vitreomacular adhesion, bilateral: Secondary | ICD-10-CM | POA: Diagnosis not present

## 2021-10-26 DIAGNOSIS — E113291 Type 2 diabetes mellitus with mild nonproliferative diabetic retinopathy without macular edema, right eye: Secondary | ICD-10-CM | POA: Diagnosis not present

## 2021-10-28 ENCOUNTER — Ambulatory Visit: Payer: Medicare Other | Admitting: Gastroenterology

## 2021-10-29 DIAGNOSIS — M47816 Spondylosis without myelopathy or radiculopathy, lumbar region: Secondary | ICD-10-CM | POA: Diagnosis not present

## 2021-10-29 DIAGNOSIS — M545 Low back pain, unspecified: Secondary | ICD-10-CM | POA: Diagnosis not present

## 2021-10-29 DIAGNOSIS — M5416 Radiculopathy, lumbar region: Secondary | ICD-10-CM | POA: Diagnosis not present

## 2021-10-29 DIAGNOSIS — M4126 Other idiopathic scoliosis, lumbar region: Secondary | ICD-10-CM | POA: Diagnosis not present

## 2021-11-02 DIAGNOSIS — M00062 Staphylococcal arthritis, left knee: Secondary | ICD-10-CM | POA: Diagnosis not present

## 2021-11-02 DIAGNOSIS — Z792 Long term (current) use of antibiotics: Secondary | ICD-10-CM | POA: Diagnosis not present

## 2021-11-08 ENCOUNTER — Other Ambulatory Visit: Payer: Self-pay | Admitting: Adult Health

## 2021-11-08 DIAGNOSIS — M11262 Other chondrocalcinosis, left knee: Secondary | ICD-10-CM

## 2021-11-11 ENCOUNTER — Other Ambulatory Visit: Payer: Self-pay | Admitting: Adult Health

## 2021-11-11 DIAGNOSIS — L03115 Cellulitis of right lower limb: Secondary | ICD-10-CM | POA: Diagnosis not present

## 2021-11-11 DIAGNOSIS — M11262 Other chondrocalcinosis, left knee: Secondary | ICD-10-CM

## 2021-11-11 DIAGNOSIS — M00062 Staphylococcal arthritis, left knee: Secondary | ICD-10-CM | POA: Diagnosis not present

## 2021-11-11 DIAGNOSIS — B9562 Methicillin resistant Staphylococcus aureus infection as the cause of diseases classified elsewhere: Secondary | ICD-10-CM | POA: Diagnosis not present

## 2021-11-17 ENCOUNTER — Ambulatory Visit (INDEPENDENT_AMBULATORY_CARE_PROVIDER_SITE_OTHER): Payer: Medicare Other | Admitting: Internal Medicine

## 2021-11-17 ENCOUNTER — Encounter: Payer: Self-pay | Admitting: Internal Medicine

## 2021-11-17 ENCOUNTER — Other Ambulatory Visit: Payer: Self-pay

## 2021-11-17 VITALS — BP 122/78 | HR 58 | Ht 69.0 in | Wt 171.8 lb

## 2021-11-17 DIAGNOSIS — E1165 Type 2 diabetes mellitus with hyperglycemia: Secondary | ICD-10-CM

## 2021-11-17 DIAGNOSIS — E1159 Type 2 diabetes mellitus with other circulatory complications: Secondary | ICD-10-CM | POA: Diagnosis not present

## 2021-11-17 DIAGNOSIS — E785 Hyperlipidemia, unspecified: Secondary | ICD-10-CM

## 2021-11-17 DIAGNOSIS — E663 Overweight: Secondary | ICD-10-CM

## 2021-11-17 LAB — POCT GLYCOSYLATED HEMOGLOBIN (HGB A1C): Hemoglobin A1C: 5.8 % — AB (ref 4.0–5.6)

## 2021-11-17 MED ORDER — FREESTYLE LIBRE 2 SENSOR MISC: 1.0000 | 3 refills | Status: DC

## 2021-11-17 MED ORDER — FREESTYLE LIBRE 2 READER DEVI: 1.0000 | Freq: Every day | 0 refills | Status: AC

## 2021-11-17 NOTE — Progress Notes (Signed)
Patient ID: David [name], male   DOB: 1951-10-10, 70 y.o.   MRN: [id number]   This visit occurred during the SARS-CoV-2 public health emergency.  Safety protocols were in place, including screening questions prior to the visit, additional usage of staff PPE, and extensive cleaning of exam room while observing appropriate contact time as indicated for disinfecting solutions.   HPI: David [name] is a 70 y.o.-year-old male, returning for f/u for DM2, dx in ~2008, insulin-dependent since 09/2015, uncontrolled, with complications (CAD - s/p stents in 2013, PN). Last visit 3 months ago.  Interim history: At last visit, he returned after having had microdiscectomy in 05/2021.  After this, he developed weakness and fell several times.  He was planning to start PT/OT. Since then, he had a fall with multiple contusions, after which he developed a staph infection in the left knee.  He had to have surgery: Irrigation and debridement on 09/05/2021.  He was in SNF afterwards. He had a low blood sugar at 23! on labs in 07/2021 at 54 in 08/2021 in rehab. Also, EMS came  to his house for a sugar of 25! 3 weeks ago.this happened after he was active that day.  He was not unconscious yet. 09/2021, at discharge from rehab PCP simplify his antidiabetic regimen >> only NPH twice a day. He then added Humalog back.  She also continues with metformin.  Reviewed HbA1c levels: Lab Results  Component Value Date   HGBA1C 6.0 (A) 08/21/2021   HGBA1C 6.8 (H) 05/21/2021   HGBA1C 6.1 (A) 02/17/2021  03/05/2016: HbA1c 9.1%  He is on: - Metformin 1000 mg 2x a day, with meals -  NPH 30 units in a.m. and  (25-30 units) units at night >> 30 units in a.m. and 40 >> 35 units at night - Humalog 8-12 >> 16 >> 14-20 >> 16-20 >> 14-16 units before a meal - Ozempic 0.5 >> 1 >> 0.5 mg weekly (due to cost) >> now off due to lows (!) He was on Ozempic from 01/2019 to 08/2019 but had to come off due to price.  We restarted this in  01/2020. He was on Bydureon 2 mg weekly - started 01/30/2017 >> stopped 04/2017 b/c price - could not restart  Pt checks his sugars twice a day per review of his log: - am: 115, 174-282 >> 108, 125-234, 256 >> 80, 122-204 >> 83-168, 212 - 2h after b'fast: n/c >> 163 - before lunch: 85-187, 201 >> 158, 327 >> n/c  - 2h after lunch: n/c - before dinner:  136, 148-222, 267, 295 >> 113-258 >> 99-168, 185, 214 >> 80-167, 197 - 2h after dinner: n/c - bedtime:  126-209, 225-240 (candy), 261 >> 160-215 >> n/c  - nighttime: n/c Lowest sugar was 115 >> 108 >> 80 >> 25 (was active that day), OTW 40-50s; it is unclear at which level he has hypoglycemia awareness. Highest sugar was 295 >> 287 >> 215.  Glucometer: One Touch  Pt's meals are: - Breakfast: protein shake - Lunch: n/a - Dinner: meat + veggies /potatoes or salad - Snacks: no Diet sodas, ice tea with splenda.  No regular sodas.  -+ CKD, last BUN/creatinine:  Lab Results  Component Value Date   BUN 25 (H) 10/16/2021   CREATININE 1.33 10/16/2021  On losartan.  -+ HL; last set of lipids: Lab Results  Component Value Date   CHOL 135 07/14/2021   HDL 56.40 07/14/2021   LDLCALC 64 07/14/2021   LDLDIRECT 103.9 11/05/2013  TRIG 76.0 07/14/2021   CHOLHDL 2 07/14/2021  On Crestor 10.  - last eye exam was in 03/2021: No DR reportedly.  Has previous history of cataract surgery.  -he has numbness and tingling in his feet  He also has HTN, depression.  He has a history of anabolic steroid use for muscle building. He had a cardiac cath 08/2018 for presyncope: Stable coronary anatomy without high-grade coronary stenosis.  He is on medical therapy for CAD. He was found to be anemic (Hb 8.1) >> EGD report reviewed:  - Non-bleeding grade II esophageal varices. - Non-bleeding gastric ulcers with a clean ulcer base (Forrest Class III). Biopsied. - Erythematous duodenopathy. - Normal second portion of the duodenum. In 2021: He and his  wife of 40 years separated and he had a difficult time living outside his home. In 2021 he was also diagnosed with cirrhosis.  He does have a history of heavy alcohol use and he continues to use alcohol.  He has anemia and decreased platelets. He saw a chiropractor for his neuropathy.  He was using infrared light and felt that this was helping.   ROS: + see HPI Neurological: + tremors/+ numbness/+ tingling/no dizziness  I reviewed pt's medications, allergies, PMH, social hx, family hx, and changes were documented in the history of present illness. Otherwise, unchanged from my initial visit note.   Past Medical History:  Diagnosis Date   Allergy    Anemia    Arthritis    Bilateral hands and feet   CAD (coronary artery disease)    s/p bifurcation stenting (DES x2) and stenting bifurcation lesion distal RCA (1 DES)   Cataract    Cirrhosis (HCC)    Depression    Diabetes mellitus    Esophageal varices (HCC)    GERD (gastroesophageal reflux disease)    Hyperlipemia    Hypertension    Myocardial infarction ([place])    Sleep apnea    CPAP   Past Surgical History:  Procedure Laterality Date   BIOPSY  09/01/2018   Procedure: BIOPSY;  Surgeon: Mauri Pole, MD;  Location: [facility];  Service: Endoscopy;;   CATARACT EXTRACTION Bilateral    CORONARY ANGIOPLASTY WITH STENT PLACEMENT   March 2010   Dr. Olevia Perches   ESOPHAGOGASTRODUODENOSCOPY (EGD) WITH PROPOFOL N/A 09/01/2018   Procedure: ESOPHAGOGASTRODUODENOSCOPY (EGD) WITH PROPOFOL;  Surgeon: Mauri Pole, MD;  Location: [facility];  Service: Endoscopy;  Laterality: N/A;   KNEE ARTHROSCOPY Left 09/07/2021   Procedure: IRRIGATION AND DEBRIDEMENT OF LEFT KNEE; ARTHROSCOPY OF LEFT KNEE, PARTIAL MEDIAL [place].;  Surgeon: [name], MD;  Location: [facility];  Service: Orthopedics;  Laterality: Left;   LEFT HEART CATH AND CORONARY ANGIOGRAPHY N/A 08/31/2018   Procedure: LEFT HEART CATH AND CORONARY ANGIOGRAPHY;  Surgeon:  Sherren Mocha, MD;  Location: [facility];  Service: Cardiovascular;  Laterality: N/A;   THORACIC DISCECTOMY Right 06/19/2021   Procedure: Microdiscectomy - right - Thoracic Eleven-Thoracic Twelve;  Surgeon: Earnie Larsson, MD;  Location: [place];  Service: Neurosurgery;  Laterality: Right;   TONSILLECTOMY  1959   Social History   Social History   Marital Status: Married    Spouse Name: N/A   Number of Children: 2   Occupational History   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     Ascorbic Acid (VITAMIN C) 1000 MG tablet Take 1,000 mg by mouth daily.     Coenzyme Q10 (CO Q 10) 100 MG CAPS Take 100 mg by mouth daily.     colchicine 0.6 MG tablet Take 1 tablet (0.6 mg total) by mouth daily. 30 tablet 0   ferrous sulfate 325 (65 FE) MG tablet Take 1 tablet (325 mg total) by mouth daily with breakfast. 30 tablet 0   furosemide (LASIX) 20 MG tablet Take 1 tablet (20 mg total) by mouth daily. 30 tablet 0   glucose blood (ONE TOUCH ULTRA TEST) test strip Use to test blood sugar 2 times daily as instructed. Dx: E11.59, E11.65 (Patient not taking: Reported on 09/15/2021) 100 each 5   insulin aspart (NOVOLOG FLEXPEN) 100 UNIT/ML FlexPen Inject 10 Units into the skin 2 (two) times daily. For CBG greater than or equal to 250 15 mL 0   insulin NPH Human (NOVOLIN N) 100 UNIT/ML injection Inject 30-40 Units into the skin 2 (two) times daily  before a meal.     Insulin Pen Needle 32G X 4 MM MISC Use 4x a day - with insulin 200 each 3   Insulin Syringe-Needle U-100 (RELION INSULIN SYRINGE) 31G X 15/64" 0.5 ML MISC USE  TWICE DAILY 100 each 5   metoprolol tartrate (LOPRESSOR) 50 MG tablet Take 1 tablet (50 mg total) by mouth 2 (two) times daily. 30 tablet 0   Multiple Vitamin (MULTIVITAMIN WITH MINERALS) TABS tablet Take 1 tablet by mouth at bedtime.     ONETOUCH DELICA LANCETS FINE MISC Use to test blood sugar 2 times daily as instructed. Dx: E11.59, E11.65 (Patient not taking: Reported on 10/16/2021) 100 each 5   pantoprazole (PROTONIX) 40 MG tablet Take 1 tablet (40 mg total) by mouth 2 (two) times daily. 60 tablet 0   PARoxetine (PAXIL) 40 MG tablet Take 1 tablet (40 mg total) by mouth every morning. 30 tablet 0   Polyethyl Glycol-Propyl Glycol (SYSTANE) 0.4-0.3 % SOLN Place 2 drops into both eyes daily.     rosuvastatin (CRESTOR) 10 MG tablet Take 1 tablet (10 mg total) by mouth daily. 30 tablet 0   spironolactone (ALDACTONE) 50 MG tablet Take 1 tablet (50 mg total) by mouth daily. 30 tablet 0   traMADol (ULTRAM) 50 MG tablet Take 1 tablet (50 mg total) by mouth every 6 (six) hours as needed. 20 tablet 0   triamcinolone cream (KENALOG) 0.1 % APPLY TOPICALLY TO SCALP TWO TIMES DAILY AS NEEDED 30 g 0   No current facility-administered medications on file prior to visit.   No Known Allergies  Family History  Problem Relation Age of Onset   Heart failure Father        Deceased 49 y/o   Diabetes Father    Cancer Father        Prostate   Heart failure Mother        Deceased 39 y/o  Alcohol abuse Mother    Schizophrenia Son        Paranoid   Uveitis Son    Cancer Brother        prostate   Colon cancer Neg Hx    Esophageal cancer Neg Hx    Stomach cancer Neg Hx    Liver cancer Neg Hx    Pancreatic cancer Neg Hx    Rectal cancer Neg Hx    PE: Ht 5\' 9"  (1.753 m)    Wt 171 lb 12.8 oz (77.9 kg)    BMI 25.37 kg/m  Body  mass index is 25.37 kg/m. Wt Readings from Last 3 Encounters:  11/17/21 171 lb 12.8 oz (77.9 kg)  10/16/21 162 lb (73.5 kg)  10/07/21 163 lb 12.8 oz (74.3 kg)   Constitutional: overweight, in NAD Eyes: PERRLA, EOMI, no exophthalmos ENT: moist mucous membranes, no thyromegaly, no cervical lymphadenopathy Cardiovascular: RRR, No MRG, + mild pitting edema in LE bilaterally Respiratory: CTA B Musculoskeletal: no deformities, strength intact in all 4 Skin: moist, warm, no rashes Neurological: + tremor with outstretched hands, DTR normal in all 4  ASSESSMENT: 1. DM2, insulin-dependent, uncontrolled, with complications - CAD - s/p 4 stents 2013 - seeing Dr Burt Knack - PN  Component     Latest Ref Rng 06/09/2016  C-Peptide     0.80-3.85 ng/mL 1.78  Glucose, Fasting     65 - 99 mg/dL 184 (H)  Glutamic Acid Decarb Ab     <5 IU/mL <5  Pancreatic Islet Cell Antibody     < 5 JDF Units <5   2. HL  3.  History of overweight  PLAN:  1. Patient with longstanding, uncontrolled, DM2, with increased blood sugar variability.  He is on a complex regimen with metformin, basal/bolus insulin regimen and weekly GLP-1 receptor agonist.  The directly measured HbA1c levels are usually better than expected from his log.  At last visit sugars were better but still fluctuating with occasional blood sugars in the 200, especially when he was drinking alcohol.  Discussed about the importance of continuing to try to stop.  I also suggested a CGM and gave him a list of suppliers. HbA1c was 6.0% in 07/2021. -Since last visit, he had a very low blood sugar on labs on 08/21/2021, at 23 !!!  His Ozempic was held.   He also had a left knee staph infection after a fall and had to have surgery for this in 08/2021.  He was in a SNF  -he had a documented low blood sugar at 54 on 09/25/2021.  He had another low, 25, 3 weeks ago, after being discharged from rehab.  PCP sent me a message 10/16/2021 -he simplified patient's regimen  to NPH twice a day after getting out of rehab.  Patient tells me that since then, he added back metformin and Humalog.  She is still off Ozempic. -At this visit, we discussed about his low blood sugars.  I do not see this documented in his blood sugar log, it appears that he did not have low blood sugars in the last 2 weeks.  However, we discussed about adding back Ozempic which is less likely to cause low blood sugars compared to insulin.  At this visit I advised him to reduce the dose of Humalog as we are restarting Ozempic.  I advised him to get in touch with me and if he has any more lows, will need to reduce his NPH doses. -I again discussed with him  about the need to start a CGM.  We demonstrated its use and I sent a prescription to his pharmacy.  I did advise him that he may need to get it from a supplier, depending on insurance preference - I suggested to:  Patient Instructions  Please continue: - Metformin 1000 mg 2x a day, with meals - NPH 30 units in am and 35 units at bedtime  Please reduce: - Humalog 8-10 units 15 min before each meal  Please restart: - Ozempic 0.25 mg weekly x 1 ween, then increase to 0.5 mg weekly  The most common suppliers for the continuous glucose monitor are:  [place] [employer]: [place]: [phone] [employer]: [place]: [place]: [phone] [place]: [phone]  Please return in 1.5 months with your sugar log or CGM.   - we checked his HbA1c: 5.8% (lower) - advised to check sugars at different times of the day - 4x a day, rotating check times - advised for yearly eye exams >> he is UTD - return to clinic in 1.5 months     2. HL -Reviewed latest lipid panel from 06/2021: Fractions at goal: Lab Results  Component Value Date   CHOL 135 07/14/2021   HDL 56.40 07/14/2021   LDLCALC 64 07/14/2021   LDLDIRECT 103.9 11/05/2013   TRIG 76.0  07/14/2021   CHOLHDL 2 07/14/2021  -He continues on Crestor 10 mg daily without side effects  3.  History of overweight -BMI normalized after cutting out fatty foods and alcohol -Before last visit he lost 25 pounds in the previous 6 months -He gained weight since last visit -We will add back Ozempic which should also help with weight loss  Philemon Kingdom, MD PhD [facility] Endocrinology

## 2021-11-17 NOTE — Patient Instructions (Addendum)
Please continue: - Metformin 1000 mg 2x a day, with meals - NPH 30 units in am and 35 units at bedtime  Please reduce: - Humalog 8-10 units 15 min before each meal  Please restart: - Ozempic 0.25 mg weekly x 1 ween, then increase to 0.5 mg weekly  The most common suppliers for the continuous glucose monitor are:  [place] [employer]: [place]: [phone] Ext [phone] [employer]: [place]: [place]: [phone] [place]: [phone]  Please return in 1.5 months with your sugar log or CGM.

## 2021-11-18 ENCOUNTER — Encounter: Payer: Self-pay | Admitting: Internal Medicine

## 2021-11-18 DIAGNOSIS — E1159 Type 2 diabetes mellitus with other circulatory complications: Secondary | ICD-10-CM

## 2021-11-19 DIAGNOSIS — D696 Thrombocytopenia, unspecified: Secondary | ICD-10-CM | POA: Diagnosis not present

## 2021-11-19 DIAGNOSIS — L03115 Cellulitis of right lower limb: Secondary | ICD-10-CM | POA: Diagnosis not present

## 2021-11-19 DIAGNOSIS — E1140 Type 2 diabetes mellitus with diabetic neuropathy, unspecified: Secondary | ICD-10-CM | POA: Diagnosis not present

## 2021-11-19 DIAGNOSIS — I2510 Atherosclerotic heart disease of native coronary artery without angina pectoris: Secondary | ICD-10-CM | POA: Diagnosis not present

## 2021-11-19 DIAGNOSIS — M11262 Other chondrocalcinosis, left knee: Secondary | ICD-10-CM | POA: Diagnosis not present

## 2021-11-19 DIAGNOSIS — B9562 Methicillin resistant Staphylococcus aureus infection as the cause of diseases classified elsewhere: Secondary | ICD-10-CM | POA: Diagnosis not present

## 2021-11-19 DIAGNOSIS — D539 Nutritional anemia, unspecified: Secondary | ICD-10-CM | POA: Diagnosis not present

## 2021-11-19 DIAGNOSIS — D631 Anemia in chronic kidney disease: Secondary | ICD-10-CM | POA: Diagnosis not present

## 2021-11-19 DIAGNOSIS — M19041 Primary osteoarthritis, right hand: Secondary | ICD-10-CM | POA: Diagnosis not present

## 2021-11-19 DIAGNOSIS — K7031 Alcoholic cirrhosis of liver with ascites: Secondary | ICD-10-CM | POA: Diagnosis not present

## 2021-11-19 DIAGNOSIS — I1310 Hypertensive heart and chronic kidney disease without heart failure, with stage 1 through stage 4 chronic kidney disease, or unspecified chronic kidney disease: Secondary | ICD-10-CM | POA: Diagnosis not present

## 2021-11-19 DIAGNOSIS — M00062 Staphylococcal arthritis, left knee: Secondary | ICD-10-CM | POA: Diagnosis not present

## 2021-11-19 DIAGNOSIS — F339 Major depressive disorder, recurrent, unspecified: Secondary | ICD-10-CM | POA: Diagnosis not present

## 2021-11-19 DIAGNOSIS — E1122 Type 2 diabetes mellitus with diabetic chronic kidney disease: Secondary | ICD-10-CM | POA: Diagnosis not present

## 2021-11-19 DIAGNOSIS — I8500 Esophageal varices without bleeding: Secondary | ICD-10-CM | POA: Diagnosis not present

## 2021-11-19 DIAGNOSIS — N1831 Chronic kidney disease, stage 3a: Secondary | ICD-10-CM | POA: Diagnosis not present

## 2021-11-21 ENCOUNTER — Emergency Department: Payer: Medicare Other

## 2021-11-21 ENCOUNTER — Encounter: Payer: Self-pay | Admitting: Radiology

## 2021-11-21 ENCOUNTER — Inpatient Hospital Stay
Admission: EM | Admit: 2021-11-21 | Discharge: 2021-11-22 | DRG: 637 | Disposition: A | Discharge status: Home / Self Care | Payer: Medicare Other | Attending: Internal Medicine | Admitting: Internal Medicine

## 2021-11-21 DIAGNOSIS — E785 Hyperlipidemia, unspecified: Secondary | ICD-10-CM | POA: Diagnosis present

## 2021-11-21 DIAGNOSIS — I10 Essential (primary) hypertension: Secondary | ICD-10-CM | POA: Diagnosis present

## 2021-11-21 DIAGNOSIS — K219 Gastro-esophageal reflux disease without esophagitis: Secondary | ICD-10-CM | POA: Diagnosis not present

## 2021-11-21 DIAGNOSIS — Z8249 Family history of ischemic heart disease and other diseases of the circulatory system: Secondary | ICD-10-CM | POA: Diagnosis not present

## 2021-11-21 DIAGNOSIS — E162 Hypoglycemia, unspecified: Secondary | ICD-10-CM | POA: Diagnosis not present

## 2021-11-21 DIAGNOSIS — Z955 Presence of coronary angioplasty implant and graft: Secondary | ICD-10-CM | POA: Diagnosis not present

## 2021-11-21 DIAGNOSIS — Z79899 Other long term (current) drug therapy: Secondary | ICD-10-CM

## 2021-11-21 DIAGNOSIS — M25562 Pain in left knee: Secondary | ICD-10-CM | POA: Diagnosis not present

## 2021-11-21 DIAGNOSIS — G9341 Metabolic encephalopathy: Secondary | ICD-10-CM | POA: Diagnosis not present

## 2021-11-21 DIAGNOSIS — Z794 Long term (current) use of insulin: Secondary | ICD-10-CM

## 2021-11-21 DIAGNOSIS — Z20822 Contact with and (suspected) exposure to covid-19: Secondary | ICD-10-CM | POA: Diagnosis not present

## 2021-11-21 DIAGNOSIS — E161 Other hypoglycemia: Secondary | ICD-10-CM | POA: Diagnosis not present

## 2021-11-21 DIAGNOSIS — Z8042 Family history of malignant neoplasm of prostate: Secondary | ICD-10-CM

## 2021-11-21 DIAGNOSIS — T68XXXA Hypothermia, initial encounter: Secondary | ICD-10-CM | POA: Diagnosis not present

## 2021-11-21 DIAGNOSIS — R680 Hypothermia, not associated with low environmental temperature: Secondary | ICD-10-CM | POA: Diagnosis present

## 2021-11-21 DIAGNOSIS — J189 Pneumonia, unspecified organism: Secondary | ICD-10-CM

## 2021-11-21 DIAGNOSIS — R531 Weakness: Secondary | ICD-10-CM | POA: Diagnosis not present

## 2021-11-21 DIAGNOSIS — Z811 Family history of alcohol abuse and dependence: Secondary | ICD-10-CM

## 2021-11-21 DIAGNOSIS — E119 Type 2 diabetes mellitus without complications: Secondary | ICD-10-CM

## 2021-11-21 DIAGNOSIS — Z833 Family history of diabetes mellitus: Secondary | ICD-10-CM | POA: Diagnosis not present

## 2021-11-21 DIAGNOSIS — K7030 Alcoholic cirrhosis of liver without ascites: Secondary | ICD-10-CM | POA: Diagnosis not present

## 2021-11-21 DIAGNOSIS — Z043 Encounter for examination and observation following other accident: Secondary | ICD-10-CM | POA: Diagnosis not present

## 2021-11-21 DIAGNOSIS — Z87891 Personal history of nicotine dependence: Secondary | ICD-10-CM | POA: Diagnosis not present

## 2021-11-21 DIAGNOSIS — R569 Unspecified convulsions: Secondary | ICD-10-CM

## 2021-11-21 DIAGNOSIS — I252 Old myocardial infarction: Secondary | ICD-10-CM | POA: Diagnosis not present

## 2021-11-21 DIAGNOSIS — I2510 Atherosclerotic heart disease of native coronary artery without angina pectoris: Secondary | ICD-10-CM | POA: Diagnosis present

## 2021-11-21 DIAGNOSIS — R0902 Hypoxemia: Secondary | ICD-10-CM | POA: Diagnosis not present

## 2021-11-21 DIAGNOSIS — Z818 Family history of other mental and behavioral disorders: Secondary | ICD-10-CM

## 2021-11-21 DIAGNOSIS — F1020 Alcohol dependence, uncomplicated: Secondary | ICD-10-CM

## 2021-11-21 DIAGNOSIS — G8929 Other chronic pain: Secondary | ICD-10-CM | POA: Diagnosis not present

## 2021-11-21 DIAGNOSIS — J690 Pneumonitis due to inhalation of food and vomit: Secondary | ICD-10-CM | POA: Diagnosis not present

## 2021-11-21 DIAGNOSIS — E11649 Type 2 diabetes mellitus with hypoglycemia without coma: Secondary | ICD-10-CM | POA: Diagnosis not present

## 2021-11-21 DIAGNOSIS — M47812 Spondylosis without myelopathy or radiculopathy, cervical region: Secondary | ICD-10-CM | POA: Diagnosis not present

## 2021-11-21 LAB — TROPONIN I (HIGH SENSITIVITY): Troponin I (High Sensitivity): 8 ng/L (ref <18)

## 2021-11-21 LAB — CBG MONITORING, ED
Glucose-Capillary: 119 mg/dL — ABNORMAL HIGH (ref 70–99)
Glucose-Capillary: 24 mg/dL — CL (ref 70–99)

## 2021-11-21 LAB — COMPREHENSIVE METABOLIC PANEL
ALT: 38 U/L (ref 0–44)
AST: 61 U/L — ABNORMAL HIGH (ref 15–41)
Albumin: 3.2 g/dL — ABNORMAL LOW (ref 3.5–5.0)
Alkaline Phosphatase: 102 U/L (ref 38–126)
Anion gap: 5 (ref 5–15)
BUN: 23 mg/dL (ref 8–23)
CO2: 24 mmol/L (ref 22–32)
Calcium: 9.3 mg/dL (ref 8.9–10.3)
Chloride: 104 mmol/L (ref 98–111)
Creatinine, Ser: 0.86 mg/dL (ref 0.61–1.24)
GFR, Estimated: >60 mL/min (ref >60)
Glucose, Bld: 33 mg/dL — CL (ref 70–99)
Potassium: 4.4 mmol/L (ref 3.5–5.1)
Sodium: 133 mmol/L — ABNORMAL LOW (ref 135–145)
Total Bilirubin: 1.4 mg/dL — ABNORMAL HIGH (ref 0.3–1.2)
Total Protein: 7.9 g/dL (ref 6.5–8.1)

## 2021-11-21 LAB — CBC WITH DIFFERENTIAL/PLATELET
Abs Immature Granulocytes: 0.1 10*3/uL — ABNORMAL HIGH (ref 0.00–0.07)
Basophils Absolute: 0 10*3/uL (ref 0.0–0.1)
Basophils Relative: 0 %
Eosinophils Absolute: 0.1 10*3/uL (ref 0.0–0.5)
Eosinophils Relative: 1 %
HCT: 33.2 % — ABNORMAL LOW (ref 39.0–52.0)
Hemoglobin: 10.9 g/dL — ABNORMAL LOW (ref 13.0–17.0)
Immature Granulocytes: 1 %
Lymphocytes Relative: 13 %
Lymphs Abs: 0.9 10*3/uL (ref 0.7–4.0)
MCH: 33.3 pg (ref 26.0–34.0)
MCHC: 32.8 g/dL (ref 30.0–36.0)
MCV: 101.5 fL — ABNORMAL HIGH (ref 80.0–100.0)
Monocytes Absolute: 0.9 10*3/uL (ref 0.1–1.0)
Monocytes Relative: 12 %
Neutro Abs: 5.4 10*3/uL (ref 1.7–7.7)
Neutrophils Relative %: 73 %
Platelets: 138 10*3/uL — ABNORMAL LOW (ref 150–400)
RBC: 3.27 MIL/uL — ABNORMAL LOW (ref 4.22–5.81)
RDW: 18.4 % — ABNORMAL HIGH (ref 11.5–15.5)
WBC: 7.4 10*3/uL (ref 4.0–10.5)
nRBC: 0 % (ref 0.0–0.2)

## 2021-11-21 LAB — RESP PANEL BY RT-PCR (FLU A&B, COVID) ARPGX2
Influenza A by PCR: NEGATIVE
Influenza B by PCR: NEGATIVE
SARS Coronavirus 2 by RT PCR: NEGATIVE

## 2021-11-21 LAB — ETHANOL: Alcohol, Ethyl (B): <10 mg/dL (ref <10)

## 2021-11-21 LAB — MAGNESIUM: Magnesium: 2 mg/dL (ref 1.7–2.4)

## 2021-11-21 LAB — LACTIC ACID, PLASMA: Lactic Acid, Venous: 1.7 mmol/L (ref 0.5–1.9)

## 2021-11-21 MED ORDER — SODIUM CHLORIDE 0.9 % IV BOLUS: 1000.0000 mL | Freq: Once | INTRAVENOUS | Status: DC

## 2021-11-21 MED ORDER — LORAZEPAM 2 MG/ML IJ SOLN
1.0000 mg | Freq: Once | INTRAMUSCULAR | Status: AC
  Administered 2021-11-21: 22:00:00 1 mg via INTRAVENOUS
  Filled 2021-11-21: qty 1

## 2021-11-21 MED ORDER — DEXTROSE 50 % IV SOLN
INTRAVENOUS | Status: AC
  Filled 2021-11-21: qty 50

## 2021-11-21 MED ORDER — DEXTROSE-NACL 10-0.45 % IV SOLN
INTRAVENOUS | Status: DC
  Filled 2021-11-21 (×2): qty 1000

## 2021-11-21 MED ORDER — SODIUM CHLORIDE 0.9 % IV SOLN
2.0000 g | Freq: Once | INTRAVENOUS | Status: AC
  Administered 2021-11-22: 01:00:00 2 g via INTRAVENOUS
  Filled 2021-11-21: qty 2

## 2021-11-21 MED ORDER — SODIUM CHLORIDE 0.9 % IV SOLN
500.0000 mg | Freq: Once | INTRAVENOUS | Status: AC
  Administered 2021-11-22: 01:00:00 500 mg via INTRAVENOUS
  Filled 2021-11-21: qty 5

## 2021-11-21 NOTE — ED Notes (Signed)
Pt groggy, able to drink apple juice through a straw with assistance at this time.

## 2021-11-21 NOTE — ED Triage Notes (Signed)
Per ems pt fell in bathroom, fsbs was 32, on their arrival ems states pt with seizure like activity. Ems gave amp d50 with blood sugar up to 166. Pt alert and oriented appears in no acute distress, several skin tears noted.

## 2021-11-21 NOTE — ED Notes (Signed)
BG resulted 24 EDP notified, Amp D50 given

## 2021-11-21 NOTE — ED Provider Notes (Signed)
[facility] Emergency Department Provider Note   ____________________________________________   Event Date/Time   First MD Initiated Contact with Patient 11/21/21 2149     (approximate)  I have reviewed the triage vital signs and the nursing notes.   HISTORY  Chief Complaint Hypoglycemia    HPI David [name] is a 70 y.o. male with past medical history of alcohol abuse, diabetes, cirrhosis, and septic arthritis who presents to the ED complaining of hyperglycemia.  Patient states that he began to feel dizzy and lightheaded in his home earlier this evening, does not remember what happened next but remembers waking up on the floor with blood around his left arm.  Patient's roommate had reported seizure-like activity with diffuse shaking, patient denies any history of seizures but does admit to passing out before.  EMS also reports that patient appeared stiff when they arrived, however they deny any postictal state.  They did find patient's blood sugar to be in the 30s and he was given an amp of D50 with improvement to greater than 100.  Patient admits that he has not had anything to eat since breakfast time, is not sure when he last gave himself insulin.  He admits to drinking alcohol about every other day, consuming 5-6 beers at a time.  He denies any alcohol consumption since yesterday, denies any drug use.  He denies any areas of pain.        Past Medical History:  Diagnosis Date   Allergy    Anemia    Arthritis    Bilateral hands and feet   CAD (coronary artery disease)    s/p bifurcation stenting (DES x2) and stenting bifurcation lesion distal RCA (1 DES)   Cataract    Cirrhosis (HCC)    Depression    Diabetes mellitus    Esophageal varices (HCC)    GERD (gastroesophageal reflux disease)    Hyperlipemia    Hypertension    Myocardial infarction ([place])    Sleep apnea    CPAP    Patient Active Problem List   Diagnosis Date Noted   At risk for  adverse drug event 09/14/2021   Fall 09/09/2021   Staphylococcal arthritis of left knee (HCC)    Knee pain 09/06/2021   Pseudogout of knee, left 09/06/2021   Cellulitis 59/56/3875   Alcoholic cirrhosis of liver without ascites ([place]) 09/06/2021   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 ([place]) 09/06/2021   Contusion of right elbow 08/21/2021   Malnutrition of mild degree ([place]) 08/21/2021   Myelopathy concurrent with and due to spinal stenosis of thoracic region ([place]) 06/19/2021   Actinic keratoses 05/22/2021   BPH associated with nocturia 06/10/2020   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[place]) 12/11/2019   Hip pain 08/07/2019   History of esophageal varices 64/33/2951   Alcoholic cirrhosis of liver with ascites ([place])    Secondary esophageal varices without bleeding ([place])    Macrocytic anemia 08/31/2018   Overweight 09/13/2017   Poorly controlled type 2 diabetes mellitus with circulatory disorder ([place]) 01/18/2017   Medial epicondylitis of elbow, left 08/03/2016   Elevated liver function tests 07/13/2016   Thrombocytopenia ([place]) 07/13/2016   Trigger middle finger of left hand 03/02/2016   Bilateral hand pain 12/02/2015   Primary osteoarthritis of both first carpometacarpal joints 12/02/2015   Advance directive discussed with patient 05/16/2015   Routine general medical examination at a health care facility 02/16/2011   Erectile dysfunction 02/16/2011   Essential hypertension 12/21/2010  Hyperlipidemia LDL goal <70 06/24/2009   Osteoarthritis, multiple sites 04/16/2009   CAD (coronary artery disease) 02/04/2009   Major depressive disorder, recurrent episode ([place]) 08/21/2008   ALLERGIC RHINITIS 08/21/2008   Sleep apnea 08/21/2008    Past Surgical History:  Procedure Laterality Date   BIOPSY  09/01/2018   Procedure: BIOPSY;  Surgeon: Mauri Pole, MD;  Location: [place];  Service: Endoscopy;;   CATARACT EXTRACTION Bilateral    CORONARY ANGIOPLASTY WITH STENT  PLACEMENT   March 2010   Dr. Olevia Perches   ESOPHAGOGASTRODUODENOSCOPY (EGD) WITH PROPOFOL N/A 09/01/2018   Procedure: ESOPHAGOGASTRODUODENOSCOPY (EGD) WITH PROPOFOL;  Surgeon: Mauri Pole, MD;  Location: [place] [facility];  Service: Endoscopy;  Laterality: N/A;   KNEE ARTHROSCOPY Left 09/07/2021   Procedure: IRRIGATION AND DEBRIDEMENT OF LEFT KNEE; ARTHROSCOPY OF LEFT KNEE, PARTIAL MEDIAL [place].;  Surgeon: [name], MD;  Location: [facility];  Service: Orthopedics;  Laterality: Left;   LEFT HEART CATH AND CORONARY ANGIOGRAPHY N/A 08/31/2018   Procedure: LEFT HEART CATH AND CORONARY ANGIOGRAPHY;  Surgeon: Sherren Mocha, MD;  Location: [place] [facility];  Service: Cardiovascular;  Laterality: N/A;   THORACIC DISCECTOMY Right 06/19/2021   Procedure: Microdiscectomy - right - Thoracic Eleven-Thoracic Twelve;  Surgeon: Earnie Larsson, MD;  Location: [place];  Service: Neurosurgery;  Laterality: Right;   TONSILLECTOMY  1959    Prior to Admission medications   Medication Sig Start Date End Date Taking? Authorizing Provider  Alpha Lipoic Acid 200 MG CAPS Take 1 capsule by mouth daily.    [provider]  Ascorbic Acid (VITAMIN C) 1000 MG tablet Take 1,000 mg by mouth daily.    [provider]  Coenzyme Q10 (CO Q 10) 100 MG CAPS Take 100 mg by mouth daily.    [provider]  colchicine 0.6 MG tablet Take 1 tablet (0.6 mg total) by mouth daily. 10/07/21   Medina-Vargas, Monina C, NP  Continuous Blood Gluc Receiver (FREESTYLE LIBRE 2 READER) DEVI 1 each by Does not apply route daily. 11/17/21   Philemon Kingdom, MD  Continuous Blood Gluc Sensor (FREESTYLE LIBRE 2 SENSOR) MISC 1 each by Does not apply route every 14 (fourteen) days. 11/17/21   Philemon Kingdom, MD  ferrous sulfate 325 (65 FE) MG tablet Take 1 tablet (325 mg total) by mouth daily with breakfast. 10/07/21   Medina-Vargas, Monina C, NP  furosemide (LASIX) 20 MG tablet Take 1 tablet (20 mg total) by mouth daily.  10/07/21   Medina-Vargas, Monina C, NP  glucose blood (ONE TOUCH ULTRA TEST) test strip Use to test blood sugar 2 times daily as instructed. Dx: E11.59, E11.65 Patient not taking: Reported on 09/15/2021 10/07/15   Philemon Kingdom, MD  insulin NPH Human (NOVOLIN N) 100 UNIT/ML injection Inject 30-40 Units into the skin 2 (two) times daily before a meal.    [provider]  Insulin Pen Needle 32G X 4 MM MISC Use 4x a day - with insulin 06/11/19   Philemon Kingdom, MD  Insulin Syringe-Needle U-100 (RELION INSULIN SYRINGE) 31G X 15/64" 0.5 ML MISC USE  TWICE DAILY 11/24/20   Philemon Kingdom, MD  metoprolol tartrate (LOPRESSOR) 50 MG tablet Take 1 tablet (50 mg total) by mouth 2 (two) times daily. 10/07/21   Medina-Vargas, Monina C, NP  Multiple Vitamin (MULTIVITAMIN WITH MINERALS) TABS tablet Take 1 tablet by mouth at bedtime.    [provider]  [employer] DELICA LANCETS FINE MISC Use to test blood sugar 2 times daily as instructed.  Dx: E11.59, E11.65 Patient not taking: Reported on 10/16/2021 10/07/15   Philemon Kingdom, MD  pantoprazole (PROTONIX) 40 MG tablet Take 1 tablet (40 mg total) by mouth 2 (two) times daily. 10/07/21   Medina-Vargas, Monina C, NP  PARoxetine (PAXIL) 40 MG tablet Take 1 tablet (40 mg total) by mouth every morning. 10/07/21   Medina-Vargas, Monina C, NP  Polyethyl Glycol-Propyl Glycol (SYSTANE) 0.4-0.3 % SOLN Place 2 drops into both eyes daily.    [provider]  rosuvastatin (CRESTOR) 10 MG tablet Take 1 tablet (10 mg total) by mouth daily. 10/07/21   Medina-Vargas, Monina C, NP  spironolactone (ALDACTONE) 50 MG tablet Take 1 tablet (50 mg total) by mouth daily. 10/07/21   Medina-Vargas, Monina C, NP  traMADol (ULTRAM) 50 MG tablet Take 1 tablet (50 mg total) by mouth every 6 (six) hours as needed. 10/07/21   Medina-Vargas, Monina C, NP  triamcinolone cream (KENALOG) 0.1 % APPLY TOPICALLY TO SCALP TWO TIMES DAILY AS NEEDED 10/07/21   Medina-Vargas, Monina  C, NP    Allergies Patient has no known allergies.  Family History  Problem Relation Age of Onset   Heart failure Father        Deceased 56 y/o   Diabetes Father    Cancer Father        Prostate   Heart failure Mother        Deceased 5 y/o   Alcohol abuse Mother    Schizophrenia Son        Paranoid   Uveitis Son    Cancer Brother        prostate   Colon cancer Neg Hx    Esophageal cancer Neg Hx    Stomach cancer Neg Hx    Liver cancer Neg Hx    Pancreatic cancer Neg Hx    Rectal cancer Neg Hx     Social History Social History   Tobacco Use   Smoking status: Former    Types: Cigarettes    Quit date: 11/30/1983    Years since quitting: 38.0   Smokeless tobacco: Never  Vaping Use   Vaping Use: Never used  Substance Use Topics   Alcohol use: Yes    Alcohol/week: 20.0 standard drinks    Types: 20 Cans of beer per week    Comment: per pt 10-15 beers/week (as of 06/18/21)   Drug use: No    Review of Systems  Constitutional: No fever/chills Eyes: No visual changes. ENT: No sore throat. Cardiovascular: Denies chest pain.  Positive for loss of consciousness. Respiratory: Denies shortness of breath. Gastrointestinal: No abdominal pain.  No nausea, no vomiting.  No diarrhea.  No constipation. Genitourinary: Negative for dysuria. Musculoskeletal: Negative for back pain. Skin: Negative for rash. Neurological: Negative for headaches, focal weakness or numbness.  ____________________________________________   PHYSICAL EXAM:  VITAL SIGNS: ED Triage Vitals  Enc Vitals Group     BP 11/21/21 2144 (!) 190/89     Pulse Rate 11/21/21 2144 (!) 56     Resp 11/21/21 2143 18     Temp --      Temp Source 11/21/21 2143 Oral     SpO2 11/21/21 2144 100 %     Weight 11/21/21 2143 200 lb (90.7 kg)     Height 11/21/21 2143 5\' 9"  (1.753 m)     Head Circumference --      Peak Flow --      Pain Score --      Pain Loc --  Pain Edu? --      Excl. in [place]? --      Constitutional: Alert and oriented. Eyes: Conjunctivae are normal. Head: Atraumatic. Nose: No congestion/rhinnorhea. Mouth/Throat: Mucous membranes are moist.  No tongue lacerations noted. Neck: Normal ROM, no midline cervical spine tenderness to palpation. Cardiovascular: Normal rate, regular rhythm. Grossly normal heart sounds.  2+ radial pulses bilaterally. Respiratory: Normal respiratory effort.  No retractions. Lungs CTAB. Gastrointestinal: Soft and nontender. No distention. Genitourinary: deferred Musculoskeletal: No lower extremity tenderness nor edema. Neurologic:  Normal speech and language. No gross focal neurologic deficits are appreciated.  Patient tremulous and shaking. Skin:  Skin is warm, dry and intact. No rash noted. Psychiatric: Mood and affect are normal. Speech and behavior are normal.  ____________________________________________   LABS (all labs ordered are listed, but only abnormal results are displayed)  Labs Reviewed  CBC WITH DIFFERENTIAL/PLATELET - Abnormal; Notable for the following components:      Result Value   RBC 3.27 (*)    Hemoglobin 10.9 (*)    HCT 33.2 (*)    MCV 101.5 (*)    RDW 18.4 (*)    Platelets 138 (*)    Abs Immature Granulocytes 0.10 (*)    All other components within normal limits  CBG MONITORING, ED - Abnormal; Notable for the following components:   Glucose-Capillary 24 (*)    All other components within normal limits  RESP PANEL BY RT-PCR (FLU A&B, COVID) ARPGX2  CULTURE, BLOOD (ROUTINE X 2)  CULTURE, BLOOD (ROUTINE X 2)  COMPREHENSIVE METABOLIC PANEL  ETHANOL  URINALYSIS, ROUTINE W REFLEX MICROSCOPIC  URINE DRUG SCREEN, QUALITATIVE ([facility] ONLY)  MAGNESIUM  LACTIC ACID, PLASMA  LACTIC ACID, PLASMA  TROPONIN I (HIGH SENSITIVITY)   ____________________________________________  EKG  ED ECG REPORT I, Blake Divine, the attending physician, personally viewed and interpreted this ECG.   Date: 11/21/2021  EKG Time:  22:13  Rate: 51  Rhythm: sinus bradycardia  Axis: Normal  Intervals:none  ST&T Change: None   PROCEDURES  Procedure(s) performed (including Critical Care):  .Critical Care Performed by: Blake Divine, MD Authorized by: Blake Divine, MD   Critical care provider statement:    Critical care time (minutes):  45   Critical care time was exclusive of:  Separately billable procedures and treating other patients and teaching time   Critical care was necessary to treat or prevent imminent or life-threatening deterioration of the following conditions:  Metabolic crisis and endocrine crisis   Critical care was time spent personally by me on the following activities:  Development of treatment plan with patient or surrogate, discussions with consultants, evaluation of patient's response to treatment, examination of patient, ordering and review of laboratory studies, ordering and review of radiographic studies, ordering and performing treatments and interventions, pulse oximetry, re-evaluation of patient's condition and review of old charts   I assumed direction of critical care for this patient from another provider in my specialty: no     Care discussed with: admitting provider     ____________________________________________   INITIAL IMPRESSION / ASSESSMENT AND PLAN / ED COURSE      70 year old male with past medical history of diabetes, alcohol abuse, cirrhosis, anemia, and septic arthritis who presents to the ED following episode where he lost consciousness and had some diffuse shaking with stiffness reported as seizure-like activity by his roommate.  It is unclear whether this episode represents syncope versus seizure, although would favor syncope given lack of postictal state, urinary incontinence, or tongue laceration.  Patient also noted to be hypoglycemic, which could have contributed to his loss of consciousness.  We will check EKG and observe on cardiac monitor, check CT head for  intracranial process contributing to episode or traumatic injury.  Patient noted to be hypothermic, states that heat has been working fine in his home.  He does not have any infectious symptoms and I have low suspicion for sepsis at this time.  We will check chest x-ray and UA in addition to labs, hold off on antibiotics.  His shaking is likely related to hypothermia, would also consider alcohol withdrawal however patient without any tongue fasciculations or tachycardia.  We will give dose of IV Ativan for seizure prophylaxis as well as possible component of alcohol withdrawal.  Chest x-ray reviewed by me and shows no infiltrate, edema, or effusion.  Patient noted to have recurrent hypoglycemia with blood glucose in the 20s, was given an additional amp of D50 and we will start on a D10 drip.  No obvious source of infection at this time and patient with no leukocytosis, but given hypoglycemia and hypothermia, we will collect blood cultures and lactic acid.  Plan to hold off on antibiotics unless source of infection identified.  Patient turned over to oncoming provider pending additional results and admission.      ____________________________________________   FINAL CLINICAL IMPRESSION(S) / ED DIAGNOSES  Final diagnoses:  Hypoglycemia  Seizure-like activity ([place])  Hypothermia, initial encounter     ED Discharge Orders     None        Note:  This document was prepared using Dragon voice recognition software and may include unintentional dictation errors.    Blake Divine, MD 11/21/21 2248

## 2021-11-22 ENCOUNTER — Encounter: Payer: Self-pay | Admitting: Internal Medicine

## 2021-11-22 ENCOUNTER — Other Ambulatory Visit: Payer: Self-pay

## 2021-11-22 DIAGNOSIS — F1020 Alcohol dependence, uncomplicated: Secondary | ICD-10-CM

## 2021-11-22 DIAGNOSIS — R569 Unspecified convulsions: Secondary | ICD-10-CM

## 2021-11-22 DIAGNOSIS — E162 Hypoglycemia, unspecified: Secondary | ICD-10-CM | POA: Diagnosis not present

## 2021-11-22 DIAGNOSIS — G9341 Metabolic encephalopathy: Secondary | ICD-10-CM

## 2021-11-22 DIAGNOSIS — E11649 Type 2 diabetes mellitus with hypoglycemia without coma: Secondary | ICD-10-CM | POA: Diagnosis not present

## 2021-11-22 LAB — URINALYSIS, ROUTINE W REFLEX MICROSCOPIC
Bilirubin Urine: NEGATIVE
Glucose, UA: NEGATIVE mg/dL
Ketones, ur: NEGATIVE mg/dL
Leukocytes,Ua: NEGATIVE
Nitrite: NEGATIVE
Protein, ur: >300 mg/dL — AB
Specific Gravity, Urine: 1.025 (ref 1.005–1.030)
pH: 5.5 (ref 5.0–8.0)

## 2021-11-22 LAB — URINE DRUG SCREEN, QUALITATIVE (ARMC ONLY)
Amphetamines, Ur Screen: NOT DETECTED
Barbiturates, Ur Screen: NOT DETECTED
Benzodiazepine, Ur Scrn: NOT DETECTED
Cannabinoid 50 Ng, Ur ~~LOC~~: NOT DETECTED
Cocaine Metabolite,Ur ~~LOC~~: NOT DETECTED
MDMA (Ecstasy)Ur Screen: NOT DETECTED
Methadone Scn, Ur: NOT DETECTED
Opiate, Ur Screen: POSITIVE — AB
Phencyclidine (PCP) Ur S: NOT DETECTED
Tricyclic, Ur Screen: NOT DETECTED

## 2021-11-22 LAB — CBG MONITORING, ED
Glucose-Capillary: 109 mg/dL — ABNORMAL HIGH (ref 70–99)
Glucose-Capillary: 137 mg/dL — ABNORMAL HIGH (ref 70–99)
Glucose-Capillary: 170 mg/dL — ABNORMAL HIGH (ref 70–99)
Glucose-Capillary: 188 mg/dL — ABNORMAL HIGH (ref 70–99)
Glucose-Capillary: 189 mg/dL — ABNORMAL HIGH (ref 70–99)

## 2021-11-22 LAB — TROPONIN I (HIGH SENSITIVITY): Troponin I (High Sensitivity): 9 ng/L (ref <18)

## 2021-11-22 LAB — LACTIC ACID, PLASMA: Lactic Acid, Venous: 1.9 mmol/L (ref 0.5–1.9)

## 2021-11-22 MED ORDER — THIAMINE HCL 100 MG/ML IJ SOLN: 100.0000 mg | Freq: Every day | INTRAMUSCULAR | Status: DC

## 2021-11-22 MED ORDER — HYDROCODONE-ACETAMINOPHEN 5-325 MG PO TABS
1.0000 | ORAL_TABLET | ORAL | Status: DC | PRN
  Administered 2021-11-22 (×2): 1 via ORAL
  Filled 2021-11-22 (×2): qty 1

## 2021-11-22 MED ORDER — PANTOPRAZOLE SODIUM 40 MG PO TBEC
40.0000 mg | DELAYED_RELEASE_TABLET | Freq: Two times a day (BID) | ORAL | Status: DC
  Administered 2021-11-22 (×2): 40 mg via ORAL
  Filled 2021-11-22 (×2): qty 1

## 2021-11-22 MED ORDER — THIAMINE HCL 100 MG PO TABS: 100.0000 mg | ORAL_TABLET | Freq: Every day | ORAL | Status: DC

## 2021-11-22 MED ORDER — PAROXETINE HCL 20 MG PO TABS: 40.0000 mg | ORAL_TABLET | ORAL | Status: DC

## 2021-11-22 MED ORDER — LORAZEPAM 2 MG/ML IJ SOLN: 1.0000 mg | INTRAMUSCULAR | Status: DC | PRN

## 2021-11-22 MED ORDER — AMOXICILLIN-POT CLAVULANATE 875-125 MG PO TABS
1.0000 | ORAL_TABLET | Freq: Two times a day (BID) | ORAL | Status: DC
  Administered 2021-11-22: 11:00:00 1 via ORAL
  Filled 2021-11-22: qty 1

## 2021-11-22 MED ORDER — LORAZEPAM 2 MG/ML IJ SOLN: 0.0000 mg | Freq: Two times a day (BID) | INTRAMUSCULAR | Status: DC

## 2021-11-22 MED ORDER — LORAZEPAM 2 MG/ML IJ SOLN: 0.0000 mg | Freq: Four times a day (QID) | INTRAMUSCULAR | Status: DC

## 2021-11-22 MED ORDER — TRAMADOL HCL 50 MG PO TABS: 50.0000 mg | ORAL_TABLET | Freq: Four times a day (QID) | ORAL | Status: DC | PRN

## 2021-11-22 MED ORDER — ROSUVASTATIN CALCIUM 10 MG PO TABS
10.0000 mg | ORAL_TABLET | Freq: Every day | ORAL | Status: DC
  Administered 2021-11-22: 11:00:00 10 mg via ORAL
  Filled 2021-11-22: qty 1

## 2021-11-22 MED ORDER — ENOXAPARIN SODIUM 40 MG/0.4ML IJ SOSY
40.0000 mg | PREFILLED_SYRINGE | INTRAMUSCULAR | Status: DC
  Administered 2021-11-22: 11:00:00 40 mg via SUBCUTANEOUS
  Filled 2021-11-22: qty 0.4

## 2021-11-22 MED ORDER — INSULIN NPH (HUMAN) (ISOPHANE) 100 UNIT/ML ~~LOC~~ SUSP: 30.0000 [IU] | Freq: Two times a day (BID) | SUBCUTANEOUS | Status: DC

## 2021-11-22 MED ORDER — ONDANSETRON HCL 4 MG PO TABS: 4.0000 mg | ORAL_TABLET | Freq: Four times a day (QID) | ORAL | Status: DC | PRN

## 2021-11-22 MED ORDER — ADULT MULTIVITAMIN W/MINERALS CH
1.0000 | ORAL_TABLET | Freq: Every day | ORAL | Status: DC
  Administered 2021-11-22: 11:00:00 1 via ORAL
  Filled 2021-11-22: qty 1

## 2021-11-22 MED ORDER — ENOXAPARIN SODIUM 40 MG/0.4ML IJ SOSY: 40.0000 mg | PREFILLED_SYRINGE | INTRAMUSCULAR | Status: DC

## 2021-11-22 MED ORDER — ACETAMINOPHEN 325 MG RE SUPP: 650.0000 mg | RECTAL | Status: DC | PRN

## 2021-11-22 MED ORDER — AMOXICILLIN-POT CLAVULANATE 875-125 MG PO TABS
1.0000 | ORAL_TABLET | Freq: Two times a day (BID) | ORAL | 0 refills | Status: AC
Start: 2021-11-22 — End: 2021-11-27

## 2021-11-22 MED ORDER — ACETAMINOPHEN 325 MG PO TABS: 650.0000 mg | ORAL_TABLET | ORAL | Status: DC | PRN

## 2021-11-22 MED ORDER — FERROUS SULFATE 325 (65 FE) MG PO TABS
325.0000 mg | ORAL_TABLET | Freq: Every day | ORAL | Status: DC
  Administered 2021-11-22: 11:00:00 325 mg via ORAL
  Filled 2021-11-22: qty 1

## 2021-11-22 MED ORDER — METOPROLOL TARTRATE 50 MG PO TABS: 50.0000 mg | ORAL_TABLET | Freq: Two times a day (BID) | ORAL | Status: DC

## 2021-11-22 MED ORDER — LORAZEPAM 1 MG PO TABS: 1.0000 mg | ORAL_TABLET | ORAL | Status: DC | PRN

## 2021-11-22 MED ORDER — PAROXETINE HCL 20 MG PO TABS
40.0000 mg | ORAL_TABLET | Freq: Every day | ORAL | Status: DC
  Administered 2021-11-22: 11:00:00 40 mg via ORAL
  Filled 2021-11-22: qty 2

## 2021-11-22 MED ORDER — SODIUM CHLORIDE 0.9 % IV SOLN
75.0000 mL/h | INTRAVENOUS | Status: DC
  Administered 2021-11-22: 02:00:00 75 mL/h via INTRAVENOUS

## 2021-11-22 MED ORDER — THIAMINE HCL 100 MG PO TABS
100.0000 mg | ORAL_TABLET | Freq: Every day | ORAL | Status: DC
  Filled 2021-11-22: qty 1

## 2021-11-22 MED ORDER — ONDANSETRON HCL 4 MG/2ML IJ SOLN: 4.0000 mg | Freq: Four times a day (QID) | INTRAMUSCULAR | Status: DC | PRN

## 2021-11-22 MED ORDER — FOLIC ACID 1 MG PO TABS
1.0000 mg | ORAL_TABLET | Freq: Every day | ORAL | Status: DC
  Administered 2021-11-22: 11:00:00 1 mg via ORAL
  Filled 2021-11-22: qty 1

## 2021-11-22 NOTE — ED Notes (Addendum)
Pt observed walking with walker before discharge, states uses walker at home.  Pt assisted to wheelchair with walker.  Waiting on TOC to give approval for d/c home

## 2021-11-22 NOTE — ED Notes (Signed)
Pt woke briefly while this RN was in the room.  Denied H/A or pain and stated that he was "really tired" then fell back asleep.

## 2021-11-22 NOTE — Care Management CC44 (Signed)
Condition Code 44 Documentation Completed  Patient Details  Name: David [name] MRN: [id number] Date of Birth: 1951/05/23   Condition Code 44 given:   YES Patient signature on Condition Code 44 notice:   YES Documentation of 2 MD's agreement:   YES Code 44 added to claim:   YES  Note: Patient was in ED and RN CM spoke to patient via phone call with ED RN present. Explained Code-44 form and patient agreed verbally for RN CM to -e-sign that explanation took place. He was being discharged directly and declined a printed copy.  E-signed at 2:05 PM. Simmie Davies RN CM    Izola Price, RN 11/22/2021, 2:15 PM

## 2021-11-22 NOTE — Discharge Summary (Signed)
Physician Discharge Summary  David [name] IOX:[id number] DOB: 08/14/1951 DOA: 11/21/2021  PCP: [name], MD  Admit date: 11/21/2021 Discharge date: 11/22/2021  Admitted From: Home Disposition: Home  Recommendations for Outpatient Follow-up:  Follow up with PCP in 1-2 weeks Follow-up with endocrinology  Home Health: No Equipment/Devices: None  Discharge Condition: Stable CODE STATUS: Full Diet recommendation: Carb modified  Brief/Interim Summary:  70 y.o. male with medical history significant for Alcohol use disorder, cirrhosis, HTN ,diabetes, CAD s/p stents, history of septic knee in October 2022, insulin-dependent type 2 diabetes with recurrent episodes of hypoglycemia over the past couple months, seen by endocrinology on 11/17/2021 for recurrent hypoglycemia who was brought to the ED after a syncopal event at home followed by shaking and possible seizure-like activity as witnessed by his roommate.  EMS found him to have a blood sugar in the 30s and he was administered D50 and on arrival in the emergency room his blood sugar was back to 23.  The endocrinologist is currently in the process of simplifying his diabetic medication regimen.  His A1c on 12/20 was 5.8.  By arrival in the emergency room patient was awake and more alert  Suspected etiology for presentation is hypoglycemia.  Do not suspect seizure disorder.  Patient's D10 drip was stopped and patient was monitored for an additional 6 hours.  Glucose remained within normal limits.  At time of discharge had an extensive conversation.  Patient states he follows with endocrinology who is recommended to keep patient on metformin.  Last hemoglobin A1c 5.8.  Given the events surrounding his hospitalization I recommended he discontinue his metformin and decrease his Novolin dose to 30 units twice daily.  Also encouraged him to check his sugars 4 times daily 3 times before meals and before bedtime.  He expressed understanding.  Also  had some bradycardia noted on admission.  Review of home medication reconciliation patient is on 2 beta-blockers metoprolol and propranolol.  Presumably for different indications.  I have not made adjustments however I made the patient aware this is something he needs discussed with his outpatient providers.  There was also the question of possible aspiration.  Unable to exclude at this time.  Will choose to treat empirically with Augmentin 875/125 x 5 days.  Patient discharged home in stable condition.   Discharge Diagnoses:  Principal Problem:   Hypoglycemia Active Problems:   CAD (coronary artery disease)   Essential hypertension   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 ([place])   Acute metabolic encephalopathy   Alcohol use disorder, moderate, dependence (H[facility])   Observed seizure-like activity ([facility])   Hypoglycemia, recurrent   Acute metabolic encephalopathy secondary to hypoglycemia D10 infusion stopped.  Patient monitored for additional 6 hours.  At time of discharge will recommend patient discontinue metformin.  Reduce dose of Novolin.  Check sugars before meals and at bedtime.  Follow-up outpatient PCP and endocrinology     Observed seizure-like activity ([place]) Likely secondary to hypoglycemia.  Low suspicion for seizure disorder   Possible pneumonia - Infiltrate seen left lower lobe We will treat to treat empirically as unable to exclude aspiration pneumonia at this time.  Augmentin 875/125 x 5 days  Sinus bradycardia - Likely related to hypoglycemia and metoprolol Ho metoprolol was held.  Bradycardia resolved.  At time of discharge can resume home beta-blocker regimen.  Of note patient is also on propranolol presumably for different indication.  Patient encouraged to discuss this with his primary care physician.  Discharge Instructions  Discharge Instructions     Diet - low sodium heart healthy   Complete by: As directed    Increase activity slowly   Complete by: As  directed       Allergies as of 11/22/2021   No Known Allergies      Medication List     TAKE these medications    Alpha Lipoic Acid 200 MG Caps Take 1 capsule by mouth daily.   amoxicillin-clavulanate 875-125 MG tablet Commonly known as: AUGMENTIN Take 1 tablet by mouth every 12 (twelve) hours for 5 days.   Co Q 10 100 MG Caps Take 100 mg by mouth daily.   colchicine 0.6 MG tablet Take 1 tablet (0.6 mg total) by mouth daily.   ferrous sulfate 325 (65 FE) MG tablet Take 1 tablet (325 mg total) by mouth daily with breakfast.   FreeStyle Libre 2 Reader [facility] 1 each by Does not apply route daily.   FreeStyle Libre 2 Sensor Misc 1 each by Does not apply route every 14 (fourteen) days.   furosemide 20 MG tablet Commonly known as: LASIX Take 1 tablet (20 mg total) by mouth daily.   glucose blood test strip Commonly known as: ONE TOUCH ULTRA TEST Use to test blood sugar 2 times daily as instructed. Dx: E11.59, E11.65   HYDROcodone-acetaminophen 5-325 MG tablet Commonly known as: NORCO/VICODIN Take 1 tablet by mouth every 6 (six) hours as needed.   insulin NPH Human 100 UNIT/ML injection Commonly known as: NOVOLIN N Inject 0.3 mLs (30 Units total) into the skin 2 (two) times daily before a meal. What changed: how much to take   Insulin Pen Needle 32G X 4 MM Misc Use 4x a day - with insulin   metoprolol tartrate 50 MG tablet Commonly known as: LOPRESSOR Take 1 tablet (50 mg total) by mouth 2 (two) times daily.   multivitamin with minerals Tabs tablet Take 1 tablet by mouth at bedtime.   OneTouch Delica Lancets Fine Misc Use to test blood sugar 2 times daily as instructed. Dx: E11.59, E11.65   pantoprazole 40 MG tablet Commonly known as: PROTONIX Take 1 tablet (40 mg total) by mouth 2 (two) times daily.   PARoxetine 40 MG tablet Commonly known as: PAXIL Take 1 tablet (40 mg total) by mouth every morning.   propranolol 80 MG tablet Commonly known as:  INDERAL Take 80 mg by mouth 2 (two) times daily.   ReliOn Insulin Syringe 31G X 15/64" 0.5 ML Misc Generic drug: Insulin Syringe-Needle U-100 USE  TWICE DAILY   rosuvastatin 10 MG tablet Commonly known as: CRESTOR Take 1 tablet (10 mg total) by mouth daily.   spironolactone 50 MG tablet Commonly known as: ALDACTONE Take 1 tablet (50 mg total) by mouth daily.   Systane 0.4-0.3 % Soln Generic drug: Polyethyl Glycol-Propyl Glycol Place 2 drops into both eyes daily.   traMADol 50 MG tablet Commonly known as: ULTRAM Take 1 tablet (50 mg total) by mouth every 6 (six) hours as needed. What changed: Another medication with the same name was removed. Continue taking this medication, and follow the directions you see here.   triamcinolone cream 0.1 % Commonly known as: KENALOG APPLY TOPICALLY TO SCALP TWO TIMES DAILY AS NEEDED   vitamin C 1000 MG tablet Take 1,000 mg by mouth daily.        No Known Allergies  Consultations: None  Procedures/Studies: CT Head Wo Contrast  Result Date: 11/21/2021 CLINICAL DATA:  Fall seizure EXAM: CT HEAD  WITHOUT CONTRAST CT CERVICAL SPINE WITHOUT CONTRAST TECHNIQUE: Multidetector CT imaging of the head and cervical spine was performed following the standard protocol without intravenous contrast. Multiplanar CT image reconstructions of the cervical spine were also generated. COMPARISON:  None. FINDINGS: CT HEAD FINDINGS Brain: No acute territorial infarction, hemorrhage, or intracranial mass. Moderate atrophy. Mild chronic small vessel ischemic changes of the white matter. Nonenlarged ventricles Vascular: No hyperdense vessels. Carotid vascular calcification. Air in the cavernous sinus presumably related to peripheral line placement Skull: Normal. Negative for fracture or focal lesion. Sinuses/Orbits: No acute finding.  Opacified right maxillary sinus. Other: None CT CERVICAL SPINE FINDINGS Alignment: Reversal of cervical lordosis. Trace  anterolisthesis C4 on C5 and C7 on T1. Facet alignment is maintained. Skull base and vertebrae: No acute fracture. No primary bone lesion or focal pathologic process. Soft tissues and spinal canal: No prevertebral fluid or swelling. No visible canal hematoma. Disc levels: Moderate severe disc space narrowing and degenerative change C4-C5, C5-C6 and C6-C7. Facet degenerative changes at multiple levels with foraminal stenosis. Upper chest: Negative. Other: None IMPRESSION: 1. No CT evidence for acute intracranial abnormality. Atrophy and mild chronic small vessel ischemic changes of the white matter 2. Reversal of cervical lordosis with degenerative changes. No acute osseous abnormality Electronically Signed   By: Donavan Foil M.D.   On: 11/21/2021 23:57   CT Cervical Spine Wo Contrast  Result Date: 11/21/2021 CLINICAL DATA:  Fall seizure EXAM: CT HEAD WITHOUT CONTRAST CT CERVICAL SPINE WITHOUT CONTRAST TECHNIQUE: Multidetector CT imaging of the head and cervical spine was performed following the standard protocol without intravenous contrast. Multiplanar CT image reconstructions of the cervical spine were also generated. COMPARISON:  None. FINDINGS: CT HEAD FINDINGS Brain: No acute territorial infarction, hemorrhage, or intracranial mass. Moderate atrophy. Mild chronic small vessel ischemic changes of the white matter. Nonenlarged ventricles Vascular: No hyperdense vessels. Carotid vascular calcification. Air in the cavernous sinus presumably related to peripheral line placement Skull: Normal. Negative for fracture or focal lesion. Sinuses/Orbits: No acute finding.  Opacified right maxillary sinus. Other: None CT CERVICAL SPINE FINDINGS Alignment: Reversal of cervical lordosis. Trace anterolisthesis C4 on C5 and C7 on T1. Facet alignment is maintained. Skull base and vertebrae: No acute fracture. No primary bone lesion or focal pathologic process. Soft tissues and spinal canal: No prevertebral fluid or  swelling. No visible canal hematoma. Disc levels: Moderate severe disc space narrowing and degenerative change C4-C5, C5-C6 and C6-C7. Facet degenerative changes at multiple levels with foraminal stenosis. Upper chest: Negative. Other: None IMPRESSION: 1. No CT evidence for acute intracranial abnormality. Atrophy and mild chronic small vessel ischemic changes of the white matter 2. Reversal of cervical lordosis with degenerative changes. No acute osseous abnormality Electronically Signed   By: Donavan Foil M.D.   On: 11/21/2021 23:57   DG Chest Portable 1 View  Result Date: 11/21/2021 CLINICAL DATA:  Weakness, hypoglycemia EXAM: PORTABLE CHEST 1 VIEW COMPARISON:  September 22, 2018. FINDINGS: The heart size and mediastinal contours are within normal limits. Elevation of the right hemidiaphragm with hazy right basilar airspace opacity which may reflect atelectasis or infiltrate. Left lung is clear. No visible pleural effusion or pneumothorax. The visualized skeletal structures are unremarkable. IMPRESSION: Elevation of the right hemidiaphragm with hazy right basilar airspace opacity which may reflect atelectasis or infiltrate. Electronically Signed   By: Dahlia Bailiff M.D.   On: 11/21/2021 22:27      Subjective: Patient seen and examined on the day of discharge.  Stable  no distress.  Mentating clearly.  Sugars improved.  Blood pressure and heart rate improved.  Stable for discharge home.  Discharge Exam: Vitals:   11/22/21 1112 11/22/21 1230  BP: 127/71 116/64  Pulse: 73 72  Resp:    Temp:  98 F (36.7 C)  SpO2:  99%   Vitals:   11/22/21 0930 11/22/21 1000 11/22/21 1112 11/22/21 1230  BP: 121/78 (!) 105/50 127/71 116/64  Pulse: 67 76 73 72  Resp: (!) 21 (!) 25    Temp:    98 F (36.7 C)  TempSrc:      SpO2: 100% 100%  99%  Weight:      Height:        General: Pt is alert, awake, not in acute distress Cardiovascular: RRR, S1/S2 +, no rubs, no gallops Respiratory: CTA bilaterally,  no wheezing, no rhonchi Abdominal: Soft, NT, ND, bowel sounds + Extremities: no edema, no cyanosis    The results of significant diagnostics from this hospitalization (including imaging, microbiology, ancillary and laboratory) are listed below for reference.     Microbiology: Recent Results (from the past 240 hour(s))  Resp Panel by RT-PCR (Flu A&B, Covid) Nasopharyngeal Swab     Status: None   Collection Time: 11/21/21 10:12 PM   Specimen: Nasopharyngeal Swab; Nasopharyngeal(NP) swabs in vial transport medium  Result Value Ref Range Status   SARS Coronavirus 2 by RT PCR NEGATIVE NEGATIVE Final    Comment: (NOTE) SARS-CoV-2 target nucleic acids are NOT DETECTED.  The SARS-CoV-2 RNA is generally detectable in upper respiratory specimens during the acute phase of infection. The lowest concentration of SARS-CoV-2 viral copies this assay can detect is 138 copies/mL. A negative result does not preclude SARS-Cov-2 infection and should not be used as the sole basis for treatment or other patient management decisions. A negative result may occur with  improper specimen collection/handling, submission of specimen other than nasopharyngeal swab, presence of viral mutation(s) within the areas targeted by this assay, and inadequate number of viral copies(<138 copies/mL). A negative result must be combined with clinical observations, patient history, and epidemiological information. The expected result is Negative.  Fact Sheet for Patients:  EntrepreneurPulse.com.au  Fact Sheet for Healthcare Providers:  IncredibleEmployment.be  This test is no t yet approved or cleared by the [place] FDA and  has been authorized for detection and/or diagnosis of SARS-CoV-2 by FDA under an Emergency Use Authorization (EUA). This EUA will remain  in effect (meaning this test can be used) for the duration of the COVID-19 declaration under Section 564(b)(1) of the  Act, 21 U.S.C.section 360bbb-3(b)(1), unless the authorization is terminated  or revoked sooner.       Influenza A by PCR NEGATIVE NEGATIVE Final   Influenza B by PCR NEGATIVE NEGATIVE Final    Comment: (NOTE) The Xpert Xpress SARS-CoV-2/FLU/RSV plus assay is intended as an aid in the diagnosis of influenza from Nasopharyngeal swab specimens and should not be used as a sole basis for treatment. Nasal washings and aspirates are unacceptable for Xpert Xpress SARS-CoV-2/FLU/RSV testing.  Fact Sheet for Patients: EntrepreneurPulse.com.au  Fact Sheet for Healthcare Providers: IncredibleEmployment.be  This test is not yet approved or cleared by the [place] FDA and has been authorized for detection and/or diagnosis of SARS-CoV-2 by FDA under an Emergency Use Authorization (EUA). This EUA will remain in effect (meaning this test can be used) for the duration of the COVID-19 declaration under Section 564(b)(1) of the Act, 21 U.S.C. section 360bbb-3(b)(1), unless the authorization  is terminated or revoked.  Performed at [facility], [address] [place], [place] [address]   Culture, blood (routine x 2)     Status: None (Preliminary result)   Collection Time: 11/21/21 10:54 PM   Specimen: BLOOD  Result Value Ref Range Status   Specimen Description BLOOD RIGHT ASSIST CONTROL  Final   Special Requests   Final    BOTTLES DRAWN AEROBIC AND ANAEROBIC Blood Culture results may not be optimal due to an inadequate volume of blood received in culture bottles   Culture   Final    NO GROWTH < 12 HOURS Performed at [facility], [address], [place], [place] [address]    Report Status PENDING  Incomplete  Culture, blood (routine x 2)     Status: None (Preliminary result)   Collection Time: 11/21/21 10:54 PM   Specimen: BLOOD  Result Value Ref Range Status   Specimen Description BLOOD LEFT ASSIST CONTROL  Final   Special  Requests   Final    BOTTLES DRAWN AEROBIC AND ANAEROBIC Blood Culture adequate volume   Culture   Final    NO GROWTH < 12 HOURS Performed at [facility], [address] [place], [place] [address]    Report Status PENDING  Incomplete     Labs: BNP (last 3 results) No results for input(s): BNP in the last 8760 hours. Basic Metabolic Panel: Recent Labs  Lab 11/21/21 2212  NA 133*  K 4.4  CL 104  CO2 24  GLUCOSE 33*  BUN 23  CREATININE 0.86  CALCIUM 9.3  MG 2.0   Liver Function Tests: Recent Labs  Lab 11/21/21 2212  AST 61*  ALT 38  ALKPHOS 102  BILITOT 1.4*  PROT 7.9  ALBUMIN 3.2*   No results for input(s): LIPASE, AMYLASE in the last 168 hours. No results for input(s): AMMONIA in the last 168 hours. CBC: Recent Labs  Lab 11/21/21 2212  WBC 7.4  NEUTROABS 5.4  HGB 10.9*  HCT 33.2*  MCV 101.5*  PLT 138*   Cardiac Enzymes: No results for input(s): CKTOTAL, CKMB, CKMBINDEX, TROPONINI in the last 168 hours. BNP: Invalid input(s): POCBNP CBG: Recent Labs  Lab 11/22/21 0024 11/22/21 0153 11/22/21 0337 11/22/21 0431 11/22/21 0846  GLUCAP 109* 137* 188* 170* 189*   D-Dimer No results for input(s): DDIMER in the last 72 hours. Hgb A1c No results for input(s): HGBA1C in the last 72 hours. Lipid Profile No results for input(s): CHOL, HDL, LDLCALC, TRIG, CHOLHDL, LDLDIRECT in the last 72 hours. Thyroid function studies No results for input(s): TSH, T4TOTAL, T3FREE, THYROIDAB in the last 72 hours.  Invalid input(s): FREET3 Anemia work up No results for input(s): VITAMINB12, FOLATE, FERRITIN, TIBC, IRON, RETICCTPCT in the last 72 hours. Urinalysis    Component Value Date/Time   COLORURINE YELLOW 11/22/2021 0200   APPEARANCEUR CLEAR 11/22/2021 0200   LABSPEC 1.025 11/22/2021 0200   PHURINE 5.5 11/22/2021 0200   GLUCOSEU NEGATIVE 11/22/2021 0200   HGBUR LARGE (A) 11/22/2021 0200   BILIRUBINUR NEGATIVE 11/22/2021 0200   KETONESUR NEGATIVE  11/22/2021 0200   PROTEINUR >300 (A) 11/22/2021 0200   NITRITE NEGATIVE 11/22/2021 0200   LEUKOCYTESUR NEGATIVE 11/22/2021 0200   Sepsis Labs Invalid input(s): PROCALCITONIN,  WBC,  LACTICIDVEN Microbiology Recent Results (from the past 240 hour(s))  Resp Panel by RT-PCR (Flu A&B, Covid) Nasopharyngeal Swab     Status: None   Collection Time: 11/21/21 10:12 PM   Specimen: Nasopharyngeal Swab; Nasopharyngeal(NP)  swabs in vial transport medium  Result Value Ref Range Status   SARS Coronavirus 2 by RT PCR NEGATIVE NEGATIVE Final    Comment: (NOTE) SARS-CoV-2 target nucleic acids are NOT DETECTED.  The SARS-CoV-2 RNA is generally detectable in upper respiratory specimens during the acute phase of infection. The lowest concentration of SARS-CoV-2 viral copies this assay can detect is 138 copies/mL. A negative result does not preclude SARS-Cov-2 infection and should not be used as the sole basis for treatment or other patient management decisions. A negative result may occur with  improper specimen collection/handling, submission of specimen other than nasopharyngeal swab, presence of viral mutation(s) within the areas targeted by this assay, and inadequate number of viral copies(<138 copies/mL). A negative result must be combined with clinical observations, patient history, and epidemiological information. The expected result is Negative.  Fact Sheet for Patients:  EntrepreneurPulse.com.au  Fact Sheet for Healthcare Providers:  IncredibleEmployment.be  This test is no t yet approved or cleared by the [place] FDA and  has been authorized for detection and/or diagnosis of SARS-CoV-2 by FDA under an Emergency Use Authorization (EUA). This EUA will remain  in effect (meaning this test can be used) for the duration of the COVID-19 declaration under Section 564(b)(1) of the Act, 21 U.S.C.section 360bbb-3(b)(1), unless the authorization is  terminated  or revoked sooner.       Influenza A by PCR NEGATIVE NEGATIVE Final   Influenza B by PCR NEGATIVE NEGATIVE Final    Comment: (NOTE) The Xpert Xpress SARS-CoV-2/FLU/RSV plus assay is intended as an aid in the diagnosis of influenza from Nasopharyngeal swab specimens and should not be used as a sole basis for treatment. Nasal washings and aspirates are unacceptable for Xpert Xpress SARS-CoV-2/FLU/RSV testing.  Fact Sheet for Patients: EntrepreneurPulse.com.au  Fact Sheet for Healthcare Providers: IncredibleEmployment.be  This test is not yet approved or cleared by the [place] FDA and has been authorized for detection and/or diagnosis of SARS-CoV-2 by FDA under an Emergency Use Authorization (EUA). This EUA will remain in effect (meaning this test can be used) for the duration of the COVID-19 declaration under Section 564(b)(1) of the Act, 21 U.S.C. section 360bbb-3(b)(1), unless the authorization is terminated or revoked.  Performed at [facility], [address] [place], [place] [address]   Culture, blood (routine x 2)     Status: None (Preliminary result)   Collection Time: 11/21/21 10:54 PM   Specimen: BLOOD  Result Value Ref Range Status   Specimen Description BLOOD RIGHT ASSIST CONTROL  Final   Special Requests   Final    BOTTLES DRAWN AEROBIC AND ANAEROBIC Blood Culture results may not be optimal due to an inadequate volume of blood received in culture bottles   Culture   Final    NO GROWTH < 12 HOURS Performed at [facility], [address], [place], [place] [address]    Report Status PENDING  Incomplete  Culture, blood (routine x 2)     Status: None (Preliminary result)   Collection Time: 11/21/21 10:54 PM   Specimen: BLOOD  Result Value Ref Range Status   Specimen Description BLOOD LEFT ASSIST CONTROL  Final   Special Requests   Final    BOTTLES DRAWN AEROBIC AND ANAEROBIC Blood Culture  adequate volume   Culture   Final    NO GROWTH < 12 HOURS Performed at [facility], [address], [place],  [address]    Report Status PENDING  Incomplete  Time coordinating discharge: Over 30 minutes  SIGNED:   Sidney Ace, MD  [facility] 11/22/2021, 2:20 PM Pager   If 7PM-7AM, please contact night-coverage

## 2021-11-22 NOTE — ED Notes (Signed)
Pt resting with eyes closed, respirations even and unlabored.

## 2021-11-22 NOTE — Care Management Note (Signed)
Patient discharging and Code-44 ordered via UR. Spoke with patient on the phone and explained Code-44 claim adjustment notification requirements and patient agreed for RN CM to e-sign that this had been explained to him. ED RN also present with patient during this phone call. Code-44 signed at 2:05 pm 11/22/21. Provider and ED RN updated. Simmie Davies RN CM

## 2021-11-22 NOTE — H&P (Signed)
History and Physical    David [name] BZJ:[id number] DOB: [birth year] DOA: 11/21/2021  PCP: [name], MD   Patient coming from: home  I have personally briefly reviewed patient's relevant medical records in [place]  Chief Complaint: seizure like activity   HPI: David [name] is a 70 y.o. male with medical history significant for Alcohol use disorder, cirrhosis, HTN ,diabetes, CAD s/p stents, history of septic knee in October 2022, insulin-dependent type 2 diabetes with recurrent episodes of hypoglycemia over the past couple months, seen by endocrinology on 11/17/2021 for recurrent hypoglycemia who was brought to the ED after a syncopal event at home followed by shaking and possible seizure-like activity as witnessed by his roommate.  EMS found him to have a blood sugar in the 30s and he was administered D50 and on arrival in the emergency room his blood sugar was back to 23.  The endocrinologist is currently in the process of simplifying his diabetic medication regimen.  His A1c on 12/20 was 5.8.  By arrival in the emergency room patient was awake and more alert  ED course: BP 190/89 with pulse of 56 and hypothermic to 93 Blood work significant for glucose of 24-33-109 EtOH less than 10 Troponin 8 WBC 7.4 with lactic acid 1.7-1.9 Hemoglobin 10.9 AST/ALT 61/38 with total bili 1.4  EKG, personally viewed and interpreted: Sinus bradycardia at 51 with no acute ST-T wave changes  Imaging: CT head and C-spine nonacute Chest x-ray hazy right basilar airspace opacity which may reflect atelectasis or infiltrate  Patient was started on a D10 infusion in the ED Started on cefepime and azithromycin for possible aspiration Given thiamine Also treated with Ativan to preempt possible seizure  Hospitalist consulted for admission.   Review of Systems: Unable to obtain due to somnolence from previously administered medication in the ED  Assessment/Plan    Hypoglycemia,  recurrent   Acute metabolic encephalopathy secondary to hypoglycemia - Back to normal mentation by arrival - Continue D10 infusion - Fingersticks every hour until stable - Patient was seen by endocrinology on 12/20 for recurrent hypoglycemia - Diabetic educator consult    Observed seizure-like activity ([place]) - Likely secondary to hypoglycemia.  Lower suspicion for alcohol withdrawal seizure - Ativan as needed seizure - Seizure precautions  Possible pneumonia - Infiltrate seen left lower lobe - Patient got cefepime and azithromycin in the ED - Antibiotics not continued at this time as patient afebrile normal WBC - Continue to monitor  Sinus bradycardia - Likely related to hypoglycemia and metoprolol - Hold home metoprolol - Continuous cardiac monitoring  Hypothermia, nonenvironmental - Likely related to hypoglycemia - Continue Bair hugger and treatment for hypoglycemia  Chronic left knee pain with history of septic knee October 2022 - Tramadol as needed - Recently discharged from rehab    CAD (coronary artery disease) with history of PCI - No complaints of chest pain and EKG nonacute - Cardiac cath 2019 with patent stents - Continue rosuvastatin    Essential hypertension - Hold home spironolactone    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 ([place]) - Hold all hypoglycemic agents due to hypoglycemia    Alcohol use disorder, moderate, dependence (HCC)   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 This is judged to be reasonable and necessary in order to provide the required intensity of service to ensure the patient's safety given the presenting symptoms, physical  exam findings, and initial radiographic and laboratory data in the context of their  Comorbid conditions.   Patient requires inpatient status due to high intensity of service, high risk for further deterioration and high frequency of surveillance required.   I certify that at the point of admission it is my clinical judgment that the patient will require inpatient hospital care spanning beyond 2 midnights     Physical Exam: Vitals:   11/22/21 0030 11/22/21 0050 11/22/21 0052 11/22/21 0059  BP: 125/65  125/68   Pulse: 61  80   Resp: 10     Temp:  (!) 97.3 F (36.3 C)    TempSrc:  Axillary    SpO2: 100%     Weight:    82.5 kg  Height:    5\' 9"  (1.753 m)   Constitutional: Somnolent, arousable but will readily fall back asleep,  . Not in any apparent distress HEENT:      Head: Normocephalic and atraumatic.         Eyes: PERLA, EOMI, Conjunctivae are normal. Sclera is non-icteric.       Mouth/Throat: Mucous membranes are moist.       Neck: Supple with no signs of meningismus. Cardiovascular: Regular rate and rhythm. No murmurs, gallops, or rubs. 2+ symmetrical distal pulses are present . No JVD. No  LE edema Respiratory: Respiratory effort normal .Lungs sounds clear bilaterally. No wheezes, crackles, or rhonchi.  Gastrointestinal: Soft, non tender, non distended. Positive bowel sounds.  Genitourinary: No CVA tenderness. Musculoskeletal: Nontender with normal range of motion in all extremities. No cyanosis, or erythema of extremities. Neurologic:  Face is symmetric. Moving all extremities. No gross focal neurologic deficits . Skin: Skin is warm, dry.  No rash or ulcers Psychiatric: Mood and affect difficult to assess due to altered mental status    Past Medical History:  Diagnosis Date   Allergy    Anemia    Arthritis    Bilateral hands and feet   CAD (coronary artery disease)    s/p bifurcation stenting (DES x2) and stenting bifurcation lesion distal RCA (1 DES)   Cataract     Cirrhosis (HCC)    Depression    Diabetes mellitus    Esophageal varices (HCC)    GERD (gastroesophageal reflux disease)    Hyperlipemia    Hypertension    Myocardial infarction ([place])    Sleep apnea    CPAP    Past Surgical History:  Procedure Laterality Date   BIOPSY  09/01/2018   Procedure: BIOPSY;  Surgeon: Mauri Pole, MD;  Location: [facility];  Service: Endoscopy;;   CATARACT EXTRACTION Bilateral    CORONARY ANGIOPLASTY WITH STENT PLACEMENT   March 2010   Dr. Olevia Perches   ESOPHAGOGASTRODUODENOSCOPY (EGD) WITH PROPOFOL N/A 09/01/2018   Procedure: ESOPHAGOGASTRODUODENOSCOPY (EGD) WITH PROPOFOL;  Surgeon: Mauri Pole, MD;  Location: [facility];  Service: Endoscopy;  Laterality: N/A;   KNEE ARTHROSCOPY Left 09/07/2021   Procedure: IRRIGATION AND DEBRIDEMENT OF LEFT KNEE; ARTHROSCOPY OF LEFT KNEE, PARTIAL MEDIAL [place].;  Surgeon: [name], MD;  Location: [facility];  Service: Orthopedics;  Laterality: Left;   LEFT HEART CATH AND CORONARY ANGIOGRAPHY N/A 08/31/2018   Procedure: LEFT HEART CATH AND CORONARY ANGIOGRAPHY;  Surgeon: Sherren Mocha, MD;  Location: [facility];  Service: Cardiovascular;  Laterality: N/A;   THORACIC DISCECTOMY Right 06/19/2021   Procedure: Microdiscectomy - right - Thoracic Eleven-Thoracic Twelve;  Surgeon: Earnie Larsson, MD;  Location: [facility];  Service: Neurosurgery;  Laterality: Right;   TONSILLECTOMY  1959     reports that he quit smoking about 38 years ago. His smoking use included cigarettes. He has never used smokeless tobacco. He reports current alcohol use of about 20.0 standard drinks per week. He reports that he does not use drugs.  No Known Allergies  Family History  Problem Relation Age of Onset   Heart failure Father        Deceased 61 y/o   Diabetes Father    Cancer Father        Prostate   Heart failure Mother        Deceased 55 y/o   Alcohol abuse Mother    Schizophrenia Son        Paranoid   Uveitis  Son    Cancer Brother        prostate   Colon cancer Neg Hx    Esophageal cancer Neg Hx    Stomach cancer Neg Hx    Liver cancer Neg Hx    Pancreatic cancer Neg Hx    Rectal cancer Neg Hx       Prior to Admission medications   Medication Sig Start Date End Date Taking? Authorizing Provider  Alpha Lipoic Acid 200 MG CAPS Take 1 capsule by mouth daily.    [provider]  Ascorbic Acid (VITAMIN C) 1000 MG tablet Take 1,000 mg by mouth daily.    [provider]  Coenzyme Q10 (CO Q 10) 100 MG CAPS Take 100 mg by mouth daily.    [provider]  colchicine 0.6 MG tablet Take 1 tablet (0.6 mg total) by mouth daily. 10/07/21   Medina-Vargas, Monina C, NP  Continuous Blood Gluc Receiver (FREESTYLE LIBRE 2 READER) DEVI 1 each by Does not apply route daily. 11/17/21   Philemon Kingdom, MD  Continuous Blood Gluc Sensor (FREESTYLE LIBRE 2 SENSOR) MISC 1 each by Does not apply route every 14 (fourteen) days. 11/17/21   Philemon Kingdom, MD  ferrous sulfate 325 (65 FE) MG tablet Take 1 tablet (325 mg total) by mouth daily with breakfast. 10/07/21   Medina-Vargas, Monina C, NP  furosemide (LASIX) 20 MG tablet Take 1 tablet (20 mg total) by mouth daily. 10/07/21   Medina-Vargas, Monina C, NP  glucose blood (ONE TOUCH ULTRA TEST) test strip Use to test blood sugar 2 times daily as instructed. Dx: E11.59, E11.65 Patient not taking: Reported on 09/15/2021 10/07/15   Philemon Kingdom, MD  insulin NPH Human (NOVOLIN N) 100 UNIT/ML injection Inject 30-40 Units into the skin 2 (two) times daily before a meal.    [provider]  Insulin Pen Needle 32G X 4 MM MISC Use 4x a day - with insulin 06/11/19   Philemon Kingdom, MD  Insulin Syringe-Needle U-100 (RELION INSULIN SYRINGE) 31G X 15/64" 0.5 ML MISC USE  TWICE DAILY 11/24/20   Philemon Kingdom, MD  metoprolol tartrate (LOPRESSOR) 50 MG tablet Take 1 tablet (50 mg total) by mouth 2 (two) times daily. 10/07/21   Medina-Vargas,  Monina C, NP  Multiple Vitamin (MULTIVITAMIN WITH MINERALS) TABS tablet Take 1 tablet by mouth at bedtime.    [provider]  [facility] DELICA LANCETS FINE MISC Use to test blood sugar 2 times daily as instructed. Dx: E11.59, E11.65 Patient not taking: Reported on 10/16/2021  10/07/15   Philemon Kingdom, MD  pantoprazole (PROTONIX) 40 MG tablet Take 1 tablet (40 mg total) by mouth 2 (two) times daily. 10/07/21   Medina-Vargas, Monina C, NP  PARoxetine (PAXIL) 40 MG tablet Take 1 tablet (40 mg total) by mouth every morning. 10/07/21   Medina-Vargas, Monina C, NP  Polyethyl Glycol-Propyl Glycol (SYSTANE) 0.4-0.3 % SOLN Place 2 drops into both eyes daily.    [provider]  rosuvastatin (CRESTOR) 10 MG tablet Take 1 tablet (10 mg total) by mouth daily. 10/07/21   Medina-Vargas, Monina C, NP  spironolactone (ALDACTONE) 50 MG tablet Take 1 tablet (50 mg total) by mouth daily. 10/07/21   Medina-Vargas, Monina C, NP  traMADol (ULTRAM) 50 MG tablet Take 1 tablet (50 mg total) by mouth every 6 (six) hours as needed. 10/07/21   Medina-Vargas, Monina C, NP  triamcinolone cream (KENALOG) 0.1 % APPLY TOPICALLY TO SCALP TWO TIMES DAILY AS NEEDED 10/07/21   Medina-Vargas, Monina C, NP      Labs on Admission: I have personally reviewed following labs and imaging studies  CBC: Recent Labs  Lab 11/21/21 2212  WBC 7.4  NEUTROABS 5.4  HGB 10.9*  HCT 33.2*  MCV 101.5*  PLT 433*   Basic Metabolic Panel: Recent Labs  Lab 11/21/21 2212  NA 133*  K 4.4  CL 104  CO2 24  GLUCOSE 33*  BUN 23  CREATININE 0.86  CALCIUM 9.3  MG 2.0   GFR: Estimated Creatinine Clearance: 77.6 mL/min (by C-G formula based on SCr of 0.86 mg/dL). Liver Function Tests: Recent Labs  Lab 11/21/21 2212  AST 61*  ALT 38  ALKPHOS 102  BILITOT 1.4*  PROT 7.9  ALBUMIN 3.2*   No results for input(s): LIPASE, AMYLASE in the last 168 hours. No results for input(s): AMMONIA in the last 168 hours. Coagulation  Profile: No results for input(s): INR, PROTIME in the last 168 hours. Cardiac Enzymes: No results for input(s): CKTOTAL, CKMB, CKMBINDEX, TROPONINI in the last 168 hours. BNP (last 3 results) No results for input(s): PROBNP in the last 8760 hours. HbA1C: No results for input(s): HGBA1C in the last 72 hours. CBG: Recent Labs  Lab 11/21/21 2231 11/21/21 2253 11/22/21 0024  GLUCAP 24* 119* 109*   Lipid Profile: No results for input(s): CHOL, HDL, LDLCALC, TRIG, CHOLHDL, LDLDIRECT in the last 72 hours. Thyroid Function Tests: No results for input(s): TSH, T4TOTAL, FREET4, T3FREE, THYROIDAB in the last 72 hours. Anemia Panel: No results for input(s): VITAMINB12, FOLATE, FERRITIN, TIBC, IRON, RETICCTPCT in the last 72 hours. Urine analysis:    Component Value Date/Time   COLORURINE DARK YELLOW 08/21/2021 1211   APPEARANCEUR CLEAR 08/21/2021 1211   LABSPEC 1.015 08/21/2021 1211   PHURINE 6.0 08/21/2021 1211   GLUCOSEU NEGATIVE 08/21/2021 1211   HGBUR NEGATIVE 08/21/2021 1211   KETONESUR NEGATIVE 08/21/2021 1211   PROTEINUR TRACE (A) 08/21/2021 1211   NITRITE NEGATIVE 08/21/2021 1211   LEUKOCYTESUR NEGATIVE 08/21/2021 1211    Radiological Exams on Admission: CT Head Wo Contrast  Result Date: 11/21/2021 CLINICAL DATA:  Fall seizure EXAM: CT HEAD WITHOUT CONTRAST CT CERVICAL SPINE WITHOUT CONTRAST TECHNIQUE: Multidetector CT imaging of the head and cervical spine was performed following the standard protocol without intravenous contrast. Multiplanar CT image reconstructions of the cervical spine were also generated. COMPARISON:  None. FINDINGS: CT HEAD FINDINGS Brain: No acute territorial infarction, hemorrhage, or intracranial mass. Moderate atrophy. Mild chronic small vessel ischemic changes of the white matter. Nonenlarged ventricles Vascular:  No hyperdense vessels. Carotid vascular calcification. Air in the cavernous sinus presumably related to peripheral line placement Skull:  Normal. Negative for fracture or focal lesion. Sinuses/Orbits: No acute finding.  Opacified right maxillary sinus. Other: None CT CERVICAL SPINE FINDINGS Alignment: Reversal of cervical lordosis. Trace anterolisthesis C4 on C5 and C7 on T1. Facet alignment is maintained. Skull base and vertebrae: No acute fracture. No primary bone lesion or focal pathologic process. Soft tissues and spinal canal: No prevertebral fluid or swelling. No visible canal hematoma. Disc levels: Moderate severe disc space narrowing and degenerative change C4-C5, C5-C6 and C6-C7. Facet degenerative changes at multiple levels with foraminal stenosis. Upper chest: Negative. Other: None IMPRESSION: 1. No CT evidence for acute intracranial abnormality. Atrophy and mild chronic small vessel ischemic changes of the white matter 2. Reversal of cervical lordosis with degenerative changes. No acute osseous abnormality Electronically Signed   By: Donavan Foil M.D.   On: 11/21/2021 23:57   CT Cervical Spine Wo Contrast  Result Date: 11/21/2021 CLINICAL DATA:  Fall seizure EXAM: CT HEAD WITHOUT CONTRAST CT CERVICAL SPINE WITHOUT CONTRAST TECHNIQUE: Multidetector CT imaging of the head and cervical spine was performed following the standard protocol without intravenous contrast. Multiplanar CT image reconstructions of the cervical spine were also generated. COMPARISON:  None. FINDINGS: CT HEAD FINDINGS Brain: No acute territorial infarction, hemorrhage, or intracranial mass. Moderate atrophy. Mild chronic small vessel ischemic changes of the white matter. Nonenlarged ventricles Vascular: No hyperdense vessels. Carotid vascular calcification. Air in the cavernous sinus presumably related to peripheral line placement Skull: Normal. Negative for fracture or focal lesion. Sinuses/Orbits: No acute finding.  Opacified right maxillary sinus. Other: None CT CERVICAL SPINE FINDINGS Alignment: Reversal of cervical lordosis. Trace anterolisthesis C4 on C5 and  C7 on T1. Facet alignment is maintained. Skull base and vertebrae: No acute fracture. No primary bone lesion or focal pathologic process. Soft tissues and spinal canal: No prevertebral fluid or swelling. No visible canal hematoma. Disc levels: Moderate severe disc space narrowing and degenerative change C4-C5, C5-C6 and C6-C7. Facet degenerative changes at multiple levels with foraminal stenosis. Upper chest: Negative. Other: None IMPRESSION: 1. No CT evidence for acute intracranial abnormality. Atrophy and mild chronic small vessel ischemic changes of the white matter 2. Reversal of cervical lordosis with degenerative changes. No acute osseous abnormality Electronically Signed   By: Donavan Foil M.D.   On: 11/21/2021 23:57   DG Chest Portable 1 View  Result Date: 11/21/2021 CLINICAL DATA:  Weakness, hypoglycemia EXAM: PORTABLE CHEST 1 VIEW COMPARISON:  September 22, 2018. FINDINGS: The heart size and mediastinal contours are within normal limits. Elevation of the right hemidiaphragm with hazy right basilar airspace opacity which may reflect atelectasis or infiltrate. Left lung is clear. No visible pleural effusion or pneumothorax. The visualized skeletal structures are unremarkable. IMPRESSION: Elevation of the right hemidiaphragm with hazy right basilar airspace opacity which may reflect atelectasis or infiltrate. Electronically Signed   By: Dahlia Bailiff M.D.   On: 11/21/2021 22:27       Athena Masse MD [facility]   11/22/2021, 1:31 AM

## 2021-11-22 NOTE — ED Notes (Signed)
Pt AOx4 at this time using urinal.

## 2021-11-22 NOTE — ED Notes (Addendum)
Pt ok for D/c per TOC Barbie and Dr Priscella Mann. Pt taken to ride via w/c. NAD noted.  Sig pad not working, verbalizes understanding of instructions, denies questions or concerns

## 2021-11-22 NOTE — ED Provider Notes (Signed)
----------------------------------------- °  12:07 AM on 11/22/2021 -----------------------------------------   CT head and cervical spine interpreted per Dr. Francoise Ceo:  1. No CT evidence for acute intracranial abnormality. Atrophy and  mild chronic small vessel ischemic changes of the white matter  2. Reversal of cervical lordosis with degenerative changes. No acute  osseous abnormality   Will discuss with hospitalist services for admission.  Bair hugger applied.   Paulette Blanch, MD 11/22/21 [phone]

## 2021-11-24 ENCOUNTER — Other Ambulatory Visit: Payer: Self-pay | Admitting: Internal Medicine

## 2021-11-24 MED ORDER — OZEMPIC (0.25 OR 0.5 MG/DOSE) 2 MG/1.5ML ~~LOC~~ SOPN: 0.5000 mg | PEN_INJECTOR | SUBCUTANEOUS | 5 refills | Status: DC

## 2021-11-24 MED ORDER — METFORMIN HCL 1000 MG PO TABS: 1000.0000 mg | ORAL_TABLET | Freq: Every day | ORAL | 3 refills | Status: DC

## 2021-11-26 LAB — CULTURE, BLOOD (ROUTINE X 2)
Culture: NO GROWTH
Culture: NO GROWTH
Special Requests: ADEQUATE

## 2021-12-02 DIAGNOSIS — M48062 Spinal stenosis, lumbar region with neurogenic claudication: Secondary | ICD-10-CM | POA: Diagnosis not present

## 2021-12-02 DIAGNOSIS — I10 Essential (primary) hypertension: Secondary | ICD-10-CM | POA: Diagnosis not present

## 2021-12-02 DIAGNOSIS — Z6825 Body mass index (BMI) 25.0-25.9, adult: Secondary | ICD-10-CM | POA: Diagnosis not present

## 2021-12-11 ENCOUNTER — Other Ambulatory Visit: Payer: Self-pay | Admitting: Adult Health

## 2021-12-11 DIAGNOSIS — K219 Gastro-esophageal reflux disease without esophagitis: Secondary | ICD-10-CM

## 2021-12-14 ENCOUNTER — Telehealth: Payer: Self-pay

## 2021-12-14 NOTE — Progress Notes (Signed)
Chronic Care Management Pharmacy Assistant   Name: Jamire Shabazz  MRN: [id number] DOB: December 07, 1950  Reason for Encounter: CCM (Appointment Reminder)   Medications: Outpatient Encounter Medications as of 12/14/2021  Medication Sig Note   Alpha Lipoic Acid 200 MG CAPS Take 1 capsule by mouth daily.    Ascorbic Acid (VITAMIN C) 1000 MG tablet Take 1,000 mg by mouth daily.    Coenzyme Q10 (CO Q 10) 100 MG CAPS Take 100 mg by mouth daily.    colchicine 0.6 MG tablet Take 1 tablet (0.6 mg total) by mouth daily. 11/22/2021: Last filled 10/08/21 30 day supply   Continuous Blood Gluc Receiver (FREESTYLE LIBRE 2 READER) DEVI 1 each by Does not apply route daily.    Continuous Blood Gluc Sensor (FREESTYLE LIBRE 2 SENSOR) MISC 1 each by Does not apply route every 14 (fourteen) days.    ferrous sulfate 325 (65 FE) MG tablet Take 1 tablet (325 mg total) by mouth daily with breakfast.    furosemide (LASIX) 20 MG tablet Take 1 tablet (20 mg total) by mouth daily. 11/22/2021: Last filled 08/04/21 90 day supply   glucose blood (ONE TOUCH ULTRA TEST) test strip Use to test blood sugar 2 times daily as instructed. Dx: E11.59, E11.65 (Patient not taking: Reported on 09/15/2021)    HYDROcodone-acetaminophen (NORCO/VICODIN) 5-325 MG tablet Take 1 tablet by mouth every 6 (six) hours as needed. 11/22/2021: Filled 11/09/21 10 day supply   insulin NPH Human (NOVOLIN N) 100 UNIT/ML injection Inject 0.3 mLs (30 Units total) into the skin 2 (two) times daily before a meal.    Insulin Pen Needle 32G X 4 MM MISC Use 4x a day - with insulin    Insulin Syringe-Needle U-100 (RELION INSULIN SYRINGE) 31G X 15/64" 0.5 ML MISC USE  TWICE DAILY    metFORMIN (GLUCOPHAGE) 1000 MG tablet Take 1 tablet (1,000 mg total) by mouth daily with supper.    metoprolol tartrate (LOPRESSOR) 50 MG tablet Take 1 tablet (50 mg total) by mouth 2 (two) times daily.    Multiple Vitamin (MULTIVITAMIN WITH MINERALS) TABS tablet Take 1 tablet by mouth at  bedtime.    ONETOUCH DELICA LANCETS FINE MISC Use to test blood sugar 2 times daily as instructed. Dx: E11.59, E11.65 (Patient not taking: Reported on 10/16/2021)    pantoprazole (PROTONIX) 40 MG tablet Take 1 tablet (40 mg total) by mouth 2 (two) times daily. 11/22/2021: Last filled 10/29/21 30 day supply   PARoxetine (PAXIL) 40 MG tablet Take 1 tablet (40 mg total) by mouth every morning. 11/22/2021: Last filled 11/12/21 90 day supply   Polyethyl Glycol-Propyl Glycol (SYSTANE) 0.4-0.3 % SOLN Place 2 drops into both eyes daily.    propranolol (INDERAL) 80 MG tablet Take 80 mg by mouth 2 (two) times daily.    rosuvastatin (CRESTOR) 10 MG tablet Take 1 tablet (10 mg total) by mouth daily. 11/22/2021: Last filled 11/11/21 90 day supply   Semaglutide,0.25 or 0.5MG /DOS, (OZEMPIC, 0.25 OR 0.5 MG/DOSE,) 2 MG/1.5ML SOPN Inject 0.5 mg into the skin once a week.    spironolactone (ALDACTONE) 50 MG tablet Take 1 tablet (50 mg total) by mouth daily. 11/22/2021: Last filled 10/08/21 30 day supply   traMADol (ULTRAM) 50 MG tablet Take 1 tablet (50 mg total) by mouth every 6 (six) hours as needed. 11/22/2021: Last filled 11/10/2021 8 day supply   triamcinolone cream (KENALOG) 0.1 % APPLY TOPICALLY TO SCALP TWO TIMES DAILY AS NEEDED    No facility-administered encounter  medications on file as of 12/14/2021.   [name] was contacted to remind of upcoming telephone visit with Debbora Dus on 12/21/2021 at 8:30 am. Patient was reminded to have all medications, supplements and any blood glucose and blood pressure readings available for review at appointment. If unable to reach, a voicemail was left for patient.   Star Rating Drugs: Medication:   Last Fill: Day Supply Metformin 1000 mg  11/24/2021 90  Ozempic 0.25 or 0.5 mg 07/28/2021 28 - Patient stopped using in December due to cost Rosuvastatin 10 mg  11/11/2021 Am[place], CPP notified  Marijean Niemann, Petronila [phone]  Time Spent: 10 Minutes

## 2021-12-16 DIAGNOSIS — M179 Osteoarthritis of knee, unspecified: Secondary | ICD-10-CM | POA: Diagnosis not present

## 2021-12-16 DIAGNOSIS — M25562 Pain in left knee: Secondary | ICD-10-CM | POA: Diagnosis not present

## 2021-12-17 ENCOUNTER — Encounter: Payer: Self-pay | Admitting: Internal Medicine

## 2021-12-18 ENCOUNTER — Encounter: Payer: Self-pay | Admitting: Gastroenterology

## 2021-12-18 ENCOUNTER — Ambulatory Visit: Payer: Medicare Other | Admitting: Gastroenterology

## 2021-12-18 ENCOUNTER — Other Ambulatory Visit (INDEPENDENT_AMBULATORY_CARE_PROVIDER_SITE_OTHER): Payer: Medicare Other

## 2021-12-18 VITALS — BP 110/64 | HR 62 | Ht 69.0 in | Wt 176.0 lb

## 2021-12-18 DIAGNOSIS — K7030 Alcoholic cirrhosis of liver without ascites: Secondary | ICD-10-CM | POA: Diagnosis not present

## 2021-12-18 LAB — COMPREHENSIVE METABOLIC PANEL
ALT: 27 U/L (ref 0–53)
AST: 44 U/L — ABNORMAL HIGH (ref 0–37)
Albumin: 3.3 g/dL — ABNORMAL LOW (ref 3.5–5.2)
Alkaline Phosphatase: 120 U/L — ABNORMAL HIGH (ref 39–117)
BUN: 23 mg/dL (ref 6–23)
CO2: 27 mEq/L (ref 19–32)
Calcium: 9.6 mg/dL (ref 8.4–10.5)
Chloride: 107 mEq/L (ref 96–112)
Creatinine, Ser: 1.1 mg/dL (ref 0.40–1.50)
GFR: 66.95 mL/min (ref >60.00)
Glucose, Bld: 112 mg/dL — ABNORMAL HIGH (ref 70–99)
Potassium: 4.8 mEq/L (ref 3.5–5.1)
Sodium: 138 mEq/L (ref 135–145)
Total Bilirubin: 0.8 mg/dL (ref 0.2–1.2)
Total Protein: 7.1 g/dL (ref 6.0–8.3)

## 2021-12-18 LAB — CBC
HCT: 30.5 % — ABNORMAL LOW (ref 39.0–52.0)
Hemoglobin: 10 g/dL — ABNORMAL LOW (ref 13.0–17.0)
MCHC: 32.8 g/dL (ref 30.0–36.0)
MCV: 103.4 fl — ABNORMAL HIGH (ref 78.0–100.0)
Platelets: 125 10*3/uL — ABNORMAL LOW (ref 150.0–400.0)
RBC: 2.95 Mil/uL — ABNORMAL LOW (ref 4.22–5.81)
RDW: 18.9 % — ABNORMAL HIGH (ref 11.5–15.5)
WBC: 5.8 10*3/uL (ref 4.0–10.5)

## 2021-12-18 LAB — PROTIME-INR
INR: 1.1 ratio — ABNORMAL HIGH (ref 0.8–1.0)
Prothrombin Time: 11.6 s (ref 9.6–13.1)

## 2021-12-18 MED ORDER — FUROSEMIDE 40 MG PO TABS: 40.0000 mg | ORAL_TABLET | Freq: Every day | ORAL | 3 refills | Status: DC

## 2021-12-18 MED ORDER — SPIRONOLACTONE 100 MG PO TABS: 100.0000 mg | ORAL_TABLET | Freq: Every day | ORAL | 3 refills | Status: DC

## 2021-12-18 NOTE — Patient Instructions (Signed)
If you are age 73 or older, your body mass index should be between 23-30. Your Body mass index is 25.99 kg/m. If this is out of the aforementioned range listed, please consider follow up with your Primary Care Provider. ________________________________________________________  The [facility] providers would like to encourage you to use [facility] to communicate with providers for non-urgent requests or questions.  Due to long hold times on the telephone, sending your provider a message by [facility] may be a faster and more efficient way to get a response.  Please allow 48 business hours for a response.  Please remember that this is for non-urgent requests.  _______________________________________________________  Your provider has requested that you go to the basement level for lab work before leaving today. Press "B" on the elevator. The lab is located at the first door on the left as you exit the elevator.   You will need blood work in 2 weeks (01-01-22).  No appointment is needed for lab work.   Due to recent changes in healthcare laws, you may see the results of your imaging and laboratory studies on MyChart before your provider has had a chance to review them.  We understand that in some cases there may be results that are confusing or concerning to you. Not all laboratory results come back in the same time frame and the provider may be waiting for multiple results in order to interpret others.  Please give [place] 48 hours in order for your provider to thoroughly review all the results before contacting the office for clarification of your results.   You have been scheduled for an abdominal ultrasound at [facility] (1st floor of hospital) on 12-29-21 at [place]. Please arrive 30 minutes prior to your appointment for registration. Make certain not to have anything to eat or drink after midnight prior to your appointment. Should you need to reschedule your appointment, please contact radiology at  [phone]. This test typically takes about 30 minutes to perform.  We have sent the following medications to your pharmacy for you to pick up at your convenience:  INCREASE: Lasix 40mg  daily INCREASE: Spironolactone 100mg  daily  Thank you for entrusting me with your care and choosing [facility]  Dr Ardis Hughs

## 2021-12-18 NOTE — Progress Notes (Signed)
Review of pertinent gastrointestinal problems: 1. Cirrhosis, etoh related, diagnosed 2019.  Long history of alcohol abuse: beer, bourbon. Workup 2019 labs ANA negative, normal iron studies, Hep C Ab negative, Hep B Surface Ab negative, Hep B Surface Ag negative, Hep A total Ab negative.  Imaging: [place] October 2019 showed nodule lower liver.  No focal lesions.  Ultrasound January 2021 no focal liver lesion.  + Mild to moderate ascites. Ultrasound 10/2020 cirrhosis without mass lesions. EGD 08/2018: Grade II distal esophagus varices, several small distal gastric ulcers (path H. Pylori negative). Started on nadolol 20mg  daily, changed to propranolol 20 BID at some point.  EGD May 2021 showed several trunks of large lower esophageal varices.  Also moderate severe changes of portal gastropathy, no gastric varices.  His propranolol was increased to 40 twice daily. 05/2020 increase propranolol to 60 mg twice daily.  February 2022 increased propranolol to 80 mg twice daily Still drinking January 2023, sixpack every other day at least Meld score 10 based on 12/2020 2.  History of adenomatous colon polyps.  Colonoscopy 2012 2 subcentimeter adenomas removed.  Colonoscopy 12/2017 normal examination.  Recall recommended 10 years   HPI: This is a very pleasant 71 year old man   I last saw him about 1 year ago.  He was still drinking at that time and he is still drinking even now.  He drinks about a sixpack every other day at least.  I recommended quite strongly to him that he should try to cut back or stop altogether.  I increased his propranolol dosing to 80 mg twice daily.  Follow-up nurse visit showed blood pressure 98/56 and pulse 66.  His weight is down 14 pounds since his last office visit here 1 year ago  He has overall lost weight but he has been putting on abdominal girth and has noticed some swelling in his ankles.  He tries to be very good about avoiding salt in his diet, except for his 6 beers every other  day.  He has had no fevers or chills.  No overt GI bleeding.  No clear encephalopathic events.   ROS: complete GI ROS as described in HPI, all other review negative.  Constitutional:  No unintentional weight loss   Past Medical History:  Diagnosis Date   Allergy    Anemia    Arthritis    Bilateral hands and feet   CAD (coronary artery disease)    s/p bifurcation stenting (DES x2) and stenting bifurcation lesion distal RCA (1 DES)   Cataract    Cirrhosis (HCC)    Depression    Diabetes mellitus    Esophageal varices (HCC)    GERD (gastroesophageal reflux disease)    Hyperlipemia    Hypertension    Myocardial infarction ([place])    Sleep apnea    CPAP    Past Surgical History:  Procedure Laterality Date   BIOPSY  09/01/2018   Procedure: BIOPSY;  Surgeon: Mauri Pole, MD;  Location: [facility];  Service: Endoscopy;;   CATARACT EXTRACTION Bilateral    CORONARY ANGIOPLASTY WITH STENT PLACEMENT   March 2010   Dr. Olevia Perches   ESOPHAGOGASTRODUODENOSCOPY (EGD) WITH PROPOFOL N/A 09/01/2018   Procedure: ESOPHAGOGASTRODUODENOSCOPY (EGD) WITH PROPOFOL;  Surgeon: Mauri Pole, MD;  Location: [facility];  Service: Endoscopy;  Laterality: N/A;   KNEE ARTHROSCOPY Left 09/07/2021   Procedure: IRRIGATION AND DEBRIDEMENT OF LEFT KNEE; ARTHROSCOPY OF LEFT KNEE, PARTIAL MEDIAL [place].;  Surgeon: [name], MD;  Location: [facility];  Service:  Orthopedics;  Laterality: Left;   LEFT HEART CATH AND CORONARY ANGIOGRAPHY N/A 08/31/2018   Procedure: LEFT HEART CATH AND CORONARY ANGIOGRAPHY;  Surgeon: Sherren Mocha, MD;  Location: [facility];  Service: Cardiovascular;  Laterality: N/A;   THORACIC DISCECTOMY Right 06/19/2021   Procedure: Microdiscectomy - right - Thoracic Eleven-Thoracic Twelve;  Surgeon: Earnie Larsson, MD;  Location: [facility];  Service: Neurosurgery;  Laterality: Right;   TONSILLECTOMY  1959    Current Outpatient Medications  Medication Sig Dispense Refill    Alpha Lipoic Acid 200 MG CAPS Take 1 capsule by mouth daily.     Ascorbic Acid (VITAMIN C) 1000 MG tablet Take 1,000 mg by mouth daily.     Coenzyme Q10 (CO Q 10) 100 MG CAPS Take 100 mg by mouth daily.     colchicine 0.6 MG tablet Take 1 tablet (0.6 mg total) by mouth daily. 30 tablet 0   Continuous Blood Gluc Receiver (FREESTYLE LIBRE 2 READER) DEVI 1 each by Does not apply route daily. 1 each 0   Continuous Blood Gluc Sensor (FREESTYLE LIBRE 2 SENSOR) MISC 1 each by Does not apply route every 14 (fourteen) days. 6 each 3   ferrous sulfate 325 (65 FE) MG tablet Take 1 tablet (325 mg total) by mouth daily with breakfast. 30 tablet 0   furosemide (LASIX) 20 MG tablet Take 1 tablet (20 mg total) by mouth daily. 30 tablet 0   glucose blood (ONE TOUCH ULTRA TEST) test strip Use to test blood sugar 2 times daily as instructed. Dx: E11.59, E11.65 100 each 5   HYDROcodone-acetaminophen (NORCO/VICODIN) 5-325 MG tablet Take 1 tablet by mouth every 6 (six) hours as needed.     insulin NPH Human (NOVOLIN N) 100 UNIT/ML injection Inject 0.3 mLs (30 Units total) into the skin 2 (two) times daily before a meal. 10 mL    Insulin Pen Needle 32G X 4 MM MISC Use 4x a day - with insulin 200 each 3   Insulin Syringe-Needle U-100 (RELION INSULIN SYRINGE) 31G X 15/64" 0.5 ML MISC USE  TWICE DAILY 100 each 5   metFORMIN (GLUCOPHAGE) 1000 MG tablet Take 1 tablet (1,000 mg total) by mouth daily with supper. 90 tablet 3   metoprolol tartrate (LOPRESSOR) 50 MG tablet Take 1 tablet (50 mg total) by mouth 2 (two) times daily. 30 tablet 0   Multiple Vitamin (MULTIVITAMIN WITH MINERALS) TABS tablet Take 1 tablet by mouth at bedtime.     ONETOUCH DELICA LANCETS FINE MISC Use to test blood sugar 2 times daily as instructed. Dx: E11.59, E11.65 100 each 5   pantoprazole (PROTONIX) 40 MG tablet Take 1 tablet (40 mg total) by mouth 2 (two) times daily. 60 tablet 0   PARoxetine (PAXIL) 40 MG tablet Take 1 tablet (40 mg total) by  mouth every morning. 30 tablet 0   Polyethyl Glycol-Propyl Glycol (SYSTANE) 0.4-0.3 % SOLN Place 2 drops into both eyes daily.     propranolol (INDERAL) 80 MG tablet Take 80 mg by mouth 2 (two) times daily.     rosuvastatin (CRESTOR) 10 MG tablet Take 1 tablet (10 mg total) by mouth daily. 30 tablet 0   Semaglutide,0.25 or 0.5MG /DOS, (OZEMPIC, 0.25 OR 0.5 MG/DOSE,) 2 MG/1.5ML SOPN Inject 0.5 mg into the skin once a week. 1.5 mL 5   spironolactone (ALDACTONE) 50 MG tablet Take 1 tablet (50 mg total) by mouth daily. 30 tablet 0   traMADol (ULTRAM) 50 MG tablet Take 1 tablet (50  mg total) by mouth every 6 (six) hours as needed. 20 tablet 0   triamcinolone cream (KENALOG) 0.1 % APPLY TOPICALLY TO SCALP TWO TIMES DAILY AS NEEDED 30 g 0   No current facility-administered medications for this visit.    Allergies as of 12/18/2021   (No Known Allergies)    Family History  Problem Relation Age of Onset   Heart failure Father        Deceased 17 y/o   Diabetes Father    Cancer Father        Prostate   Heart failure Mother        Deceased 90 y/o   Alcohol abuse Mother    Schizophrenia Son        Paranoid   Uveitis Son    Cancer Brother        prostate   Colon cancer Neg Hx    Esophageal cancer Neg Hx    Stomach cancer Neg Hx    Liver cancer Neg Hx    Pancreatic cancer Neg Hx    Rectal cancer Neg Hx     Social History   Socioeconomic History   Marital status: Divorced    Spouse name: Not on file   Number of children: 2   Years of education: Not on file   Highest education level: Not on file  Occupational History   Occupation: Patent examiner buildings    Comment: site closed   Occupation: Engineering geologist    Comment: part time    Comment: Part time work  Tobacco Use   Smoking status: Former    Types: Cigarettes    Quit date: 11/30/1983    Years since quitting: 38.0   Smokeless tobacco: Never  Vaping Use   Vaping Use: Never used  Substance and Sexual Activity    Alcohol use: Yes    Alcohol/week: 20.0 standard drinks    Types: 20 Cans of beer per week    Comment: per pt 10-15 beers/week (as of 06/18/21)   Drug use: No   Sexual activity: Not Currently  Other Topics Concern   Not on file  Social History Narrative   No living will   Would want want son [name], as health care POA   Would accept resuscitation   Not sure about tube feeds--but doesn't want if cognitively unaware   Social Determinants of Health   Financial Resource Strain: Not on file  Food Insecurity: Not on file  Transportation Needs: Not on file  Physical Activity: Not on file  Stress: Not on file  Social Connections: Not on file  Intimate Partner Violence: Not on file     Physical Exam: BP 110/64    Pulse 62    Ht 5\' 9"  (1.753 m)    Wt 176 lb (79.8 kg)    SpO2 98%    BMI 25.99 kg/m  Constitutional: generally well-appearing Psychiatric: alert and oriented x3 Abdomen: soft, nontender, nondistended, somewhat distended, a bit tense abdomen, no peritoneal signs, normal bowel sounds 1+ peripheral edema noted in lower extremities bilaterally  Assessment and plan: 71 y.o. male with decompensated cirrhosis, alcohol related  He continues to drink alcohol at least a sixpack every other day and sometimes more on the weekends.  He understands that this is quite detrimental to his overall health and he will continue to try to cut back or quit.  Seems like he is putting on some ascites and some lower extremity edema.  I am going to start with  doubling both of his diuretic medicines so he will be taking Lasix 40 mg once daily and Aldactone 100 mg once daily.  He needs a repeat set of liver staging labs including a complete metabolic profile, coags and CBC today.  He needs a basic metabolic profile in 2 weeks to see how he is responding to his diuretics doses  We will also arrange full abdominal ultrasound to screen him for hepatoma and also to try to confirm that he is indeed putting on  ascites.  Please see the "Patient Instructions" section for addition details about the plan.  Owens Loffler, MD [facility] 12/18/2021, 2:18 PM   Total time on date of encounter was 30 minutes (this included time spent preparing to see the patient reviewing records; obtaining and/or reviewing separately obtained history; performing a medically appropriate exam and/or evaluation; counseling and educating the patient and family if present; ordering medications, tests or procedures if applicable; and documenting clinical information in the health record).

## 2021-12-21 ENCOUNTER — Other Ambulatory Visit: Payer: Self-pay | Admitting: Neurosurgery

## 2021-12-21 ENCOUNTER — Telehealth: Payer: Self-pay

## 2021-12-21 ENCOUNTER — Telehealth: Payer: Medicare Other

## 2021-12-21 NOTE — Telephone Encounter (Signed)
Recent office visits: 10/16/21 - Dr. Silvio Pate, PCP - Stop lantus. Use humalog only if your sugars are too high. Continue the novolin N twice a day and monitor your sugars. 05/22/21 - Dr. Silvio Pate, PCP - Pt presented for actinic keratoses. Treated with liquid nitrogen in office. If recurrence, excision at dermatologist.  Recent consult visits: 12/18/2021 - Gastroenterology - Pt presented for decompensated cirrhosis. He continues to drink alcohol every other day. Recommend reduction. Due to some ascites, increase Lasix 40 mg daily and increase aldactone to 100 mg daily. 11/17/2021 - Endocrinology -  Reduce Humalog to 8-10 units before meals. Restart Ozempic. Order CGM monitor to pharmacy. Follow up 1 1/2 months.  Hospital visits: 11/21/21 - ED visit, hypoglycemia

## 2021-12-21 NOTE — Telephone Encounter (Signed)
°  Chronic Care Management   Outreach Note  12/21/2021 Name: David [name] MRN: [id number] DOB: 05/16/1951  Patient had a phone appointment scheduled with clinical pharmacist today.   An unsuccessful telephone outreach was attempted. The patient was referred to the pharmacist for assistance with medications, care management and care coordination.    Patient will NOT be penalized in any way for missing a CCM appointment. The no-show fee does not apply.   A message was left to return call to: [phone].   Debbora Dus, PharmD Clinical Pharmacist [facility] [phone]

## 2021-12-21 NOTE — Progress Notes (Deleted)
Chronic Care Management Pharmacy Note  12/21/2021 Name:  David [name] MRN:  [id number] DOB:  05-07-1951  Subjective: David [name] is an 71 y.o. year old male who is a primary patient of [name], MD.  The CCM team was consulted for assistance with disease management and care coordination needs.    Engaged with patient by telephone for follow up visit in response to provider referral for pharmacy case management and/or care coordination services.   Consent to Services:  The patient was given information about Chronic Care Management services, agreed to services, and gave verbal consent prior to initiation of services.  Please see initial visit note for detailed documentation.   Patient Care Team: [name], MD as PCP - Cyndia Diver, MD as PCP - Cardiology (Cardiology) Debbora Dus, [facility] as Pharmacist (Pharmacist)  Recent office visits: 10/16/21 - Dr. Silvio Pate, PCP - Stop lantus. Use humalog only if your sugars are too high. Continue the novolin N twice a day and monitor your sugars. 05/22/21 - Dr. Silvio Pate, PCP - Pt presented for actinic keratoses. Treated with liquid nitrogen in office. If recurrence, excision at dermatologist.  Recent consult visits: 12/18/2021 - Gastroenterology - Pt presented for decompensated cirrhosis. He continues to drink alcohol every other day. Recommend reduction. Due to some ascites, increase Lasix 40 mg daily and increase aldactone to 100 mg daily. 11/17/2021 - Endocrinology -  Reduce Humalog to 8-10 units before meals. Restart Ozempic. Order CGM monitor to pharmacy. Follow up 1 1/2 months.  Hospital visits: 11/21/21 - ED visit, hypoglycemia    Objective:  Lab Results  Component Value Date   CREATININE 1.10 12/18/2021   BUN 23 12/18/2021   GFR 66.95 12/18/2021   GFRNONAA >60 11/21/2021   GFRAA 64.14 09/22/2021   NA 138 12/18/2021   K 4.8 12/18/2021   CALCIUM 9.6 12/18/2021   CO2 27 12/18/2021   GLUCOSE 112 (H)  12/18/2021    Lab Results  Component Value Date/Time   HGBA1C 5.8 (A) 11/17/2021 01:44 PM   HGBA1C 6.0 (A) 08/21/2021 12:01 PM   HGBA1C 6.8 (H) 05/21/2021 08:25 AM   HGBA1C 8.9 (H) 12/11/2019 11:05 AM   GFR 66.95 12/18/2021 02:48 PM   GFR 53.38 (L) 10/16/2021 09:05 AM   MICROALBUR 2.0 (H) 07/14/2021 11:40 AM   MICROALBUR 9.1 (H) 02/11/2010 10:11 AM    Last diabetic Eye exam:  Lab Results  Component Value Date/Time   HMDIABEYEEXA No Retinopathy 12/24/2019 12:00 AM    Last diabetic Foot exam: 09/08/20 podiatry   Lab Results  Component Value Date   CHOL 135 07/14/2021   HDL 56.40 07/14/2021   LDLCALC 64 07/14/2021   LDLDIRECT 103.9 11/05/2013   TRIG 76.0 07/14/2021   CHOLHDL 2 07/14/2021    Hepatic Function Latest Ref Rng & Units 12/18/2021 11/21/2021 10/16/2021  Total Protein 6.0 - 8.3 g/dL 7.1 7.9 -  Albumin 3.5 - 5.2 g/dL 3.3(L) 3.2(L) 3.3(L)  AST 0 - 37 U/L 44(H) 61(H) -  ALT 0 - 53 U/L 27 38 -  Alk Phosphatase 39 - 117 U/L 120(H) 102 -  Total Bilirubin 0.2 - 1.2 mg/dL 0.8 1.4(H) -  Bilirubin, Direct 0.0 - 0.3 mg/dL - - -    Lab Results  Component Value Date/Time   TSH 1.81 11/05/2013 12:49 PM   TSH 0.90 02/16/2011 11:01 AM   FREET4 0.87 05/16/2015 09:41 AM   FREET4 0.7 02/05/2009 04:40 PM    CBC Latest Ref Rng & Units 12/18/2021  11/21/2021 10/16/2021  WBC 4.0 - 10.5 K/uL 5.8 7.4 7.2  Hemoglobin 13.0 - 17.0 g/dL 10.0(L) 10.9(L) 10.2(L)  Hematocrit 39.0 - 52.0 % 30.5(L) 33.2(L) 31.4(L)  Platelets 150.0 - 400.0 K/uL 125.0(L) 138(L) 153.0    No results found for: VD25OH  Clinical ASCVD: Yes  The 10-year ASCVD risk score (Arnett DK, et al., 2019) is: 27.1%   Values used to calculate the score:     Age: 67 years     Sex: Male     Is Non-Hispanic African American: No     Diabetic: Yes     Tobacco smoker: No     Systolic Blood Pressure: 263 mmHg     Is BP treated: Yes     HDL Cholesterol: 56.4 mg/dL     Total Cholesterol: 135 mg/dL     Social History    Tobacco Use  Smoking Status Former   Types: Cigarettes   Quit date: 11/30/1983   Years since quitting: 38.0  Smokeless Tobacco Never   BP Readings from Last 3 Encounters:  12/18/21 110/64  11/22/21 116/64  11/17/21 122/78   Pulse Readings from Last 3 Encounters:  12/18/21 62  11/22/21 72  11/17/21 (!) 58   Wt Readings from Last 3 Encounters:  12/18/21 176 lb (79.8 kg)  11/22/21 181 lb 12.8 oz (82.5 kg)  11/17/21 171 lb 12.8 oz (77.9 kg)   BMI Readings from Last 3 Encounters:  12/18/21 25.99 kg/m  11/22/21 26.85 kg/m  11/17/21 25.37 kg/m    Assessment/Interventions: Review of patient past medical history, allergies, medications, health status, including review of consultants reports, laboratory and other test data, was performed as part of comprehensive evaluation and provision of chronic care management services.   SDOH:  (Social Determinants of Health) assessments and interventions performed: Yes  SDOH Screenings   Alcohol Screen: Not on file  Depression (PHQ2-9): Not on file  Financial Resource Strain: Not on file  Food Insecurity: Not on file  Housing: Not on file  Physical Activity: Not on file  Social Connections: Not on file  Stress: Not on file  Tobacco Use: Medium Risk   Smoking Tobacco Use: Former   Smokeless Tobacco Use: Never   Passive Exposure: Not on file  Transportation Needs: Not on file    [place]  No Known Allergies   Medications Reviewed Today     Reviewed by Milus Banister, MD (Physician) on 12/18/21 at 1420  Med List Status: <None>   Medication Order Taking? Sig Documenting Provider Last Dose Status Informant  Alpha Lipoic Acid 200 MG CAPS [id number] Yes Take 1 capsule by mouth daily. [provider] Taking Active Self  Ascorbic Acid (VITAMIN C) 1000 MG tablet [id number] Yes Take 1,000 mg by mouth daily. [provider] Taking Active Self  Coenzyme Q10 (CO Q 10) 100 MG CAPS [id number] Yes Take 100 mg by mouth  daily. [provider] Taking Active Self  colchicine 0.6 MG tablet [id number] Yes Take 1 tablet (0.6 mg total) by mouth daily. Nickola Major, NP Taking Active Self           Med Note Derrek Gu   Sun Nov 22, 2021  4:53 AM) Last filled 10/08/21 30 day supply  Continuous Blood Gluc Receiver (FREESTYLE LIBRE 2 READER) [place] [id number] Yes 1 each by Does not apply route daily. Philemon Kingdom, MD Taking Active Self  Continuous Blood Gluc Sensor (FREESTYLE LIBRE 2 SENSOR) [place] [id number] Yes 1 each by Does not  apply route every 14 (fourteen) days. Philemon Kingdom, MD Taking Active Self  ferrous sulfate 325 (65 FE) MG tablet [id number] Yes Take 1 tablet (325 mg total) by mouth daily with breakfast. Medina-Vargas, Monina C, NP Taking Active Self  furosemide (LASIX) 20 MG tablet [id number] Yes Take 1 tablet (20 mg total) by mouth daily. Medina-Vargas, Senaida Lange, NP Taking Active Self           Med Note Briant Cedar, TIFFANY N   Sun Nov 22, 2021  4:54 AM) Last filled 08/04/21 90 day supply  glucose blood (ONE TOUCH ULTRA TEST) test strip [id number] Yes Use to test blood sugar 2 times daily as instructed. Dx: E11.59, E11.65 Philemon Kingdom, MD Taking Active Self  HYDROcodone-acetaminophen (NORCO/VICODIN) 5-325 MG tablet [id number] Yes Take 1 tablet by mouth every 6 (six) hours as needed. [provider] Taking Active Self           Med Note Briant Cedar, Thad Ranger Nov 22, 2021  4:56 AM) Filled 11/09/21 10 day supply  insulin NPH Human (NOVOLIN N) 100 UNIT/ML injection [id number] Yes Inject 0.3 mLs (30 Units total) into the skin 2 (two) times daily before a meal. Ralene Muskrat B, MD Taking Active   Insulin Pen Needle 32G X 4 MM MISC [id number] Yes Use 4x a day - with insulin Philemon Kingdom, MD Taking Active Self  Insulin Syringe-Needle U-100 (RELION INSULIN SYRINGE) 31G X 15/64" 0.5 ML MISC [id number] Yes USE  TWICE DAILY Philemon Kingdom, MD Taking Active Self  metFORMIN  (GLUCOPHAGE) 1000 MG tablet [id number] Yes Take 1 tablet (1,000 mg total) by mouth daily with supper. Philemon Kingdom, MD Taking Active   metoprolol tartrate (LOPRESSOR) 50 MG tablet [id number] Yes Take 1 tablet (50 mg total) by mouth 2 (two) times daily. Medina-Vargas, Monina C, NP Taking Active Self  Multiple Vitamin (MULTIVITAMIN WITH MINERALS) TABS tablet [id number] Yes Take 1 tablet by mouth at bedtime. [provider] Taking Active Self  Jonetta Speak LANCETS [place] [id number] Yes Use to test blood sugar 2 times daily as instructed. Dx: E11.59, E11.65 Philemon Kingdom, MD Taking Active Self  pantoprazole (PROTONIX) 40 MG tablet [id number] Yes Take 1 tablet (40 mg total) by mouth 2 (two) times daily. Medina-VargasSenaida Lange, NP Taking Active Self           Med Note Briant Cedar, TIFFANY N   Sun Nov 22, 2021  4:57 AM) Last filled 10/29/21 30 day supply  PARoxetine (PAXIL) 40 MG tablet [id number] Yes Take 1 tablet (40 mg total) by mouth every morning. Nickola Major, NP Taking Active Self           Med Note Derrek Gu   Sun Nov 22, 2021  4:58 AM) Last filled 11/12/21 90 day supply  Polyethyl Glycol-Propyl Glycol (SYSTANE) 0.4-0.3 % SOLN [id number] Yes Place 2 drops into both eyes daily. [provider] Taking Active Self  propranolol (INDERAL) 80 MG tablet [id number] Yes Take 80 mg by mouth 2 (two) times daily. [provider] Taking Active Self  rosuvastatin (CRESTOR) 10 MG tablet [id number] Yes Take 1 tablet (10 mg total) by mouth daily. Medina-Vargas, Senaida Lange, NP Taking Active Self           Med Note Briant Cedar, TIFFANY N   Sun Nov 22, 2021  4:59 AM) Last filled 11/11/21 90 day supply  Semaglutide,0.25 or 0.5MG/DOS, (OZEMPIC, 0.25 OR 0.5 MG/DOSE,) 2 MG/1.5ML SOPN [id number] Yes Inject 0.5 mg into the skin once a week.  Philemon Kingdom, MD Taking Active   spironolactone (ALDACTONE) 50 MG tablet [id number] Yes Take 1 tablet (50 mg total) by mouth daily.  Medina-Vargas, Senaida Lange, NP Taking Active Self           Med Note Briant Cedar, TIFFANY N   Sun Nov 22, 2021  5:01 AM) Last filled 10/08/21 30 day supply  traMADol (ULTRAM) 50 MG tablet [id number] Yes Take 1 tablet (50 mg total) by mouth every 6 (six) hours as needed. Nickola Major, NP Taking Active Self           Med Note Derrek Gu   Sun Nov 22, 2021  5:02 AM) Last filled 11/10/2021 8 day supply  triamcinolone cream (KENALOG) 0.1 % [id number] Yes APPLY TOPICALLY TO SCALP TWO TIMES DAILY AS NEEDED Medina-Vargas, Monina C, NP Taking Active Self            Patient Active Problem List   Diagnosis Date Noted   Hypoglycemia 62/95/2841   Acute metabolic encephalopathy 32/44/0102   Alcohol use disorder, moderate, dependence ([place]) 11/22/2021   Observed seizure-like activity ([place]) 11/22/2021   At risk for adverse drug event 09/14/2021   Fall 09/09/2021   Staphylococcal arthritis of left knee (HCC)    Knee pain 09/06/2021   Pseudogout of knee, left 09/06/2021   Cellulitis 72/53/6644   Alcoholic cirrhosis of liver without ascites ([place]) 09/06/2021   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 ([place]) 09/06/2021   Contusion of right elbow 08/21/2021   Malnutrition of mild degree ([place]) 08/21/2021   Myelopathy concurrent with and due to spinal stenosis of thoracic region [facility]) 06/19/2021   Actinic keratoses 05/22/2021   BPH associated with nocturia 06/10/2020   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[place]) 12/11/2019   Hip pain 08/07/2019   History of esophageal varices 03/47/4259   Alcoholic cirrhosis of liver with ascites ([place])    Secondary esophageal varices without bleeding ([place])    Macrocytic anemia 08/31/2018   Overweight 09/13/2017   Poorly controlled type 2 diabetes mellitus with circulatory disorder ([place]) 01/18/2017   Medial epicondylitis of elbow, left 08/03/2016   Elevated liver function tests 07/13/2016   Thrombocytopenia ([place]) 07/13/2016   Trigger middle  finger of left hand 03/02/2016   Bilateral hand pain 12/02/2015   Primary osteoarthritis of both first carpometacarpal joints 12/02/2015   Advance directive discussed with patient 05/16/2015   Routine general medical examination at a health care facility 02/16/2011   Erectile dysfunction 02/16/2011   Essential hypertension 12/21/2010   Hyperlipidemia LDL goal <70 06/24/2009   Osteoarthritis, multiple sites 04/16/2009   CAD (coronary artery disease) 02/04/2009   Major depressive disorder, recurrent episode ([place]) 08/21/2008   ALLERGIC RHINITIS 08/21/2008   Sleep apnea 08/21/2008    Immunization History  Administered Date(s) Administered   Fluad Quad(high Dose 65+) 08/07/2019, 10/02/2020, 08/21/2021   Hep A / Hep B 04/02/2020, 04/09/2020, 05/01/2020   Influenza Split 08/30/2011, 09/18/2012   Influenza Whole 08/21/2008, 10/02/2009, 08/18/2010   Influenza, High Dose Seasonal PF 08/29/2018   Influenza,inj,Quad PF,6+ Mos 11/05/2013, 09/12/2015, 08/27/2016, 09/02/2017   Influenza-Unspecified 11/05/2013, 09/12/2015, 08/27/2016, 09/02/2017   PFIZER Comirnaty(Gray Top)Covid-19 Tri-Sucrose Vaccine 01/28/2020, 02/28/2020   PFIZER(Purple Top)SARS-COV-2 Vaccination 01/28/2020, 02/28/2020   Pneumococcal Conjugate-13 10/09/2014   Pneumococcal Polysaccharide-23 02/11/2010, 01/18/2017   Td 11/29/2002   Tdap 11/05/2013    Conditions to be addressed/monitored:  Hypertension and Diabetes   Patient Care Plan: CCM Pharmacy Care Plan     Problem Identified: CHL AMB "PATIENT-SPECIFIC PROBLEM"  Long-Range Goal: Disease Management   Start Date: 06/08/2021  Priority: High  Note:   Current Barriers:  None identified  Pharmacist Clinical Goal(s):  Patient will contact provider office for questions/concerns as evidenced notation of same in electronic health record through collaboration with PharmD and provider.   Interventions: 1:1 collaboration with [name], MD regarding development  and update of comprehensive plan of care as evidenced by provider attestation and co-signature Inter-disciplinary care team collaboration (see longitudinal plan of care) Comprehensive medication review performed; medication list updated in electronic medical record  Hypertension (BP goal <140/90) -Controlled - Losartan reduced 06/04/21 per cardiology due to hypotension. Pt reports feeling better but has not checked home BP. He has had several recent falls prior to dose reduction. He feels unsteady and contributes this to neuropathy/numbness. -Current treatment: Losartan 50 mg - 1 tablet daily -Pt is on the following BP lowering medications for other indications: cirrhosis - spironolactone, propranolol, furosemide -Medications previously tried: none  -Current home readings: none, he plans to purchase a home BP monitor -Denies hypotensive/hypertensive symptoms -Educated on Importance of home blood pressure monitoring; -Counseled to monitor BP at home daily for 1 week, document, and provide log at future appointments -Recommended to continue current medication; CMA follow up for BP log.  Diabetes (A1c goal <7%) -Controlled - per A1c 6.8% -Managed by endocrinology -Current medications: Metformin 1000 mg - 1 tablet twice daily Ozempic 0.5 mg - Inject once weekly Insulin NPH  (Novolin N) - Inject 30 units am and 40 units at bedtime Insulin lispro(Humalog) - Inject 14-16 units before meals -Medications previously tried: none reported  -Current home glucose readings fasting glucose: none reported today post prandial glucose: none -Denies hypoglycemic/hyperglycemic symptoms -Educated on A1c and blood sugar goals; Prevention and management of hypoglycemic episodes; -Counseled to check feet daily and get yearly eye exams -Recommended continue follow up with endocrinology. Please call with any cost concerns.   Neuropathy (Goal: Improve symptoms) -Uncontrolled - Reports bad neuropathy (pain and  tingling in legs) gradually worse the past 3-4 months. Started going to chiropractor, helping some. Falling quite a bit. Very unsteady. Using a cane. Denies any treatment besides alpha lipoic acid for neuropathy. He has a microdiscectomy scheduled and is hopeful this will help with his pain. -Current treatment  OTC Alpha lipoic acid - 1 tablet daily -Medications previously tried: none -Recommended to continue current medication  Patient Goals/Self-Care Activities Patient will:  - contact team with health concerns  Follow Up Plan: Telephone follow up appointment with care management team member scheduled for:  6 months    Medication Assistance: None required.  Patient affirms current coverage meets needs.  Compliance/Adherence/Medication fill history: Care Gaps: UACR - diabetes followed by endo, seen in past 6 months Diabetic foot exam - completed 08/2020 Diabetic eye exam - completed 11/2019, due 11/2021 Covid vaccines Shingrix vaccines  Star-Rating Drugs: Losartan 55m            05/14/21            90ds Rosuvastatin 12m    06/09/21            90ds Metformin 100062m    06/09/21            90ds Ozempic                      06/01/21              28ds  No gaps in adherence  Patient's preferred pharmacy is: [facility] [address], [place] [address] [place] [address] Phone: [phone] Fax: [phone]  Uses pill box? No - has not needed one yet Pt endorses 100% compliance  We discussed: Current pharmacy is preferred with insurance plan and patient is satisfied with pharmacy services Patient decided to: Continue current medication management strategy  Care Plan and Follow Up Patient Decision:  Patient agrees to Care Plan and Follow-up.  Debbora Dus, PharmD Clinical Pharmacist [place] [facility] at [facility] [phone]

## 2021-12-22 NOTE — Progress Notes (Signed)
Patient rescheduled to 12/24/21 with Charlene Brooke.   Debbora Dus, CPP notified  Margaretmary Dys, [facility]acy Assistant [phone]

## 2021-12-24 ENCOUNTER — Ambulatory Visit (INDEPENDENT_AMBULATORY_CARE_PROVIDER_SITE_OTHER): Payer: Medicare Other | Admitting: Pharmacist

## 2021-12-24 ENCOUNTER — Other Ambulatory Visit: Payer: Self-pay

## 2021-12-24 DIAGNOSIS — F330 Major depressive disorder, recurrent, mild: Secondary | ICD-10-CM

## 2021-12-24 DIAGNOSIS — Z794 Long term (current) use of insulin: Secondary | ICD-10-CM

## 2021-12-24 DIAGNOSIS — K7031 Alcoholic cirrhosis of liver with ascites: Secondary | ICD-10-CM

## 2021-12-24 DIAGNOSIS — E119 Type 2 diabetes mellitus without complications: Secondary | ICD-10-CM

## 2021-12-24 DIAGNOSIS — E785 Hyperlipidemia, unspecified: Secondary | ICD-10-CM

## 2021-12-24 DIAGNOSIS — I10 Essential (primary) hypertension: Secondary | ICD-10-CM

## 2021-12-24 NOTE — Patient Instructions (Signed)
Visit Information  Phone number for Pharmacist: [phone]   Goals Addressed             This Visit's Progress    Monitor and Manage My Blood Sugar-Diabetes Type 2       Timeframe:  Long-Range Goal Priority:  Medium Start Date:      12/24/21                       Expected End Date:   12/24/22                    Follow Up Date April 2023   - check blood sugar at prescribed times - check blood sugar if I feel it is too high or too low - take the blood sugar meter to all doctor visits    Why is this important?   Checking your blood sugar at home helps to keep it from getting very high or very low.  Writing the results in a diary or log helps the doctor know how to care for you.  Your blood sugar log should have the time, date and the results.  Also, write down the amount of insulin or other medicine that you take.  Other information, like what you ate, exercise done and how you were feeling, will also be helpful.     Notes:         Care Plan : [place]  Updates made by Charlton Haws, RPH since 12/24/2021 12:00 AM     Problem: Hypertension, Hyperlipidemia, Diabetes, and Depression, Cirrhosis      Long-Range Goal: Disease Management   Start Date: 06/08/2021  Expected End Date: 12/24/2022  This Visit's Progress: On track  Priority: High  Note:   Current Barriers:  Unable to independently afford treatment regimen Unable to independently monitor therapeutic efficacy  Pharmacist Clinical Goal(s):  Patient will verbalize ability to afford treatment regimen achieve adherence to monitoring guidelines and medication adherence to achieve therapeutic efficacy through collaboration with PharmD and provider.   Interventions: 1:1 collaboration with [name], MD regarding development and update of comprehensive plan of care as evidenced by provider attestation and co-signature Inter-disciplinary care team collaboration (see longitudinal plan of  care) Comprehensive medication review performed; medication list updated in electronic medical record  Hypertension (BP goal <140/90) -Controlled - BP is at goal during office visits; given comorbid cirrhosis diuretic regimen and beta blocker below are appropriate -Current treatment: Spironolactone 100 mg daily - Appropriate, Effective, Safe, Accessible Furosemide 40 mg daily -Appropriate, Effective, Safe, Accessible Propranolol 80 mg BID -Appropriate, Effective, Safe, Accessible Metoprolol tartrate 50 mg BID - verified not taking, removed from list -Medications previously tried: losartan  -Current home readings: none, he plans to purchase a home BP monitor -Denies hypotensive/hypertensive symptoms -Educated on Importance of home blood pressure monitoring; -Counseled to monitor BP at home periodically -Recommended to continue current medication  Diabetes (A1c goal <7%) -Controlled - A1c 5.8, somewhat over-controlled with recent ED visit for hypoglycemia -Managed by endocrinology (Dr Cruzita Lederer) -Current home glucose readings fasting glucose: 90s-100s; up to 170 rarely -Current medications: Metformin 1000 mg daily w/ dinner - Appropriate, Effective, Safe, Accessible Ozempic 0.5 mg weekly -Appropriate, Effective, Safe, Not Accessible (pt never picked up due to high cost) Novolin NPH - 30 units AM, 30 units HS -Appropriate, Effective, Query Safe, Accessible Humalog - Inject 6-8 units before meals -Appropriate, Effective, Query Safe, Accessible Freestyle Libre 2 - not started  yet -Medications previously tried: none reported  -Denies hypoglycemic/hyperglycemic symptoms; -Educated on A1c and blood sugar goals; Prevention and management of hypoglycemic episodes -Assessed pt finances - pt may qualify for Ozempic PAP; emailed forms to him and he will bring them to upcoming Endocrine appt -Discussed benefits of Freestyle Libre including detecting/preventing lows; pt is not able to figure out how  to use it himself, advised him to bring device to upcoming Endocrine appt -Recommended continue follow up with endocrinology  Hyperlipidemia: (LDL goal < 70) -Controlled - LDL is at goal; pt endorses compliance with statin -Hx CAD -Current treatment: Rosuvastatin 10 mg daily - Appropriate, Effective, Safe, Accessible -Medications previously tried: n/a  -Educated on Cholesterol goals; Benefits of statin for ASCVD risk reduction; -Recommended to continue current medication  Cirrhosis (Goal: slow progression) -Query Controlled - ascites may be worse per recent GI visit, ultrasound pending 2023/01/01 -Dx 2019, ETOH related. MELD 8 from 11/2021; esophageal varices present -Current treatment  Furosemide 40 mg daily - Appropriate, Effective, Safe, Accessible Spironolactone 100 mg daily - Appropriate, Effective, Safe, Accessible Propranolol 80 mg BID - Appropriate, Effective, Safe, Accessible Liver Support supplement -Discussed benefits of diuretics and propranolol for managing complications of cirrhosis -Recommended to continue current medication  Depression (Goal: manage symptoms) -Controlled - per pt report -PHQ, GAD-7 not on file -Current treatment: Paroxetine 40 mg daily -Medications previously tried/failed: venlafaxine -Educated on Benefits of medication for symptom control -Will need to be cautious with changing SSRI/SNRI in future - several are not able to be used in cirrhosis (particularly duloxetine - avoid in cirrhosis) -Recommended to continue current medication  Patient Goals/Self-Care Activities Patient will:  - take medications as prescribed as evidenced by patient report and record review focus on medication adherence by routine check glucose daily, document, and provide at future appointments check blood pressure periodically, document, and provide at future appointments collaborate with provider on medication access solutions (Ozempic)       Patient verbalizes  understanding of instructions and care plan provided today and agrees to view in [place]. Active MyChart status confirmed with patient.   Telephone follow up appointment with pharmacy team member scheduled for: 3 months  Charlene Brooke, PharmD, [facility] Clinical Pharmacist [place] [facility] at [facility] [phone]

## 2021-12-24 NOTE — Progress Notes (Signed)
Chronic Care Management Pharmacy Note  12/24/2021 Name:  David [name] MRN:  [id number] DOB:  1951/10/20  Summary: -Pt endorses compliance with medications as prescribed -Pt verified he is not taking metoprolol and he is taking propranolol -Pt verified he is not taking Ozempic due to high cost. He reports fasting sugar 90s-100s on current regimen and denies lows. He is not using [employer] yet as he does not know how to use it. Discussed benefits of CGM for detecting/preventing hypoglycemia  Recommendations/Changes made from today's visit: -Removed metoprolol from med list -Pursue PAP for Ozempic - emailed pt the forms and he will bring these to Endocrine appt 12/31/21 -Advised pt bring Freestyle Libre to Endocrine appt next week  Plan: -[place] will call [employer] in 3-4 weeks to follow up enrollment -Pharmacist follow up televisit scheduled for 3 months -PCP physical 01/12/22   Subjective: David [name] is an 71 y.o. year old male who is a primary patient of [name], MD.  The CCM team was consulted for assistance with disease management and care coordination needs.    Engaged with patient by telephone for follow up visit in response to provider referral for pharmacy case management and/or care coordination services.   Consent to Services:  The patient was given information about Chronic Care Management services, agreed to services, and gave verbal consent prior to initiation of services.  Please see initial visit note for detailed documentation.   Patient Care Team: [name], MD as PCP - Cyndia Diver, MD as PCP - Cardiology (Cardiology) Debbora Dus, [facility] as Pharmacist (Pharmacist)  Recent office visits: 10/16/21 - Dr. Silvio Pate, PCP - Stop lantus. Use humalog only if your sugars are too high. Continue the novolin N twice a day and monitor your sugars. Also d/c'd losartan.  05/22/21 - Dr. Silvio Pate, PCP - Pt presented for actinic  keratoses. Treated with liquid nitrogen in office. If recurrence, excision at dermatologist.   Recent consult visits: 12/18/2021 - Gastroenterology - Pt presented for decompensated cirrhosis. He continues to drink alcohol every other day. Recommend reduction. Due to some ascites, increase Lasix 40 mg daily and increase aldactone to 100 mg daily.  11/24/21 Endocrine pt message: change metformin to 1000 mg once daily, reduce NPH to 20 units AM, 15 units HS, and reduce Humalog to 6-8 units  11/17/2021 - Dr Cruzita Lederer (Endocrinology) -  Reduce Humalog to 8-10 units before meals. Restart Ozempic. Order CGM monitor to pharmacy. Follow up 1 1/2 months.   Hospital visits: 11/21/21 - ED visit, hypoglycemia  09/05/21-09/10/21: admission for septic arthritis. D/C'd on IV cefazolin x 2 wks.  Objective:  Lab Results  Component Value Date   CREATININE 1.10 12/18/2021   BUN 23 12/18/2021   GFR 66.95 12/18/2021   GFRNONAA >60 11/21/2021   GFRAA 64.14 09/22/2021   NA 138 12/18/2021   K 4.8 12/18/2021   CALCIUM 9.6 12/18/2021   CO2 27 12/18/2021   GLUCOSE 112 (H) 12/18/2021    Lab Results  Component Value Date/Time   HGBA1C 5.8 (A) 11/17/2021 01:44 PM   HGBA1C 6.0 (A) 08/21/2021 12:01 PM   HGBA1C 6.8 (H) 05/21/2021 08:25 AM   HGBA1C 8.9 (H) 12/11/2019 11:05 AM   GFR 66.95 12/18/2021 02:48 PM   GFR 53.38 (L) 10/16/2021 09:05 AM   MICROALBUR 2.0 (H) 07/14/2021 11:40 AM   MICROALBUR 9.1 (H) 02/11/2010 10:11 AM    Last diabetic Eye exam:  Lab Results  Component Value Date/Time   HMDIABEYEEXA  No Retinopathy 12/24/2019 12:00 AM    Last diabetic Foot exam: 09/08/20 podiatry   Lab Results  Component Value Date   CHOL 135 07/14/2021   HDL 56.40 07/14/2021   LDLCALC 64 07/14/2021   LDLDIRECT 103.9 11/05/2013   TRIG 76.0 07/14/2021   CHOLHDL 2 07/14/2021    Hepatic Function Latest Ref Rng & Units 12/18/2021 11/21/2021 10/16/2021  Total Protein 6.0 - 8.3 g/dL 7.1 7.9 -  Albumin 3.5 - 5.2 g/dL  3.3(L) 3.2(L) 3.3(L)  AST 0 - 37 U/L 44(H) 61(H) -  ALT 0 - 53 U/L 27 38 -  Alk Phosphatase 39 - 117 U/L 120(H) 102 -  Total Bilirubin 0.2 - 1.2 mg/dL 0.8 1.4(H) -  Bilirubin, Direct 0.0 - 0.3 mg/dL - - -    Lab Results  Component Value Date/Time   TSH 1.81 11/05/2013 12:49 PM   TSH 0.90 02/16/2011 11:01 AM   FREET4 0.87 05/16/2015 09:41 AM   FREET4 0.7 02/05/2009 04:40 PM    CBC Latest Ref Rng & Units 12/18/2021 11/21/2021 10/16/2021  WBC 4.0 - 10.5 K/uL 5.8 7.4 7.2  Hemoglobin 13.0 - 17.0 g/dL 10.0(L) 10.9(L) 10.2(L)  Hematocrit 39.0 - 52.0 % 30.5(L) 33.2(L) 31.4(L)  Platelets 150.0 - 400.0 K/uL 125.0(L) 138(L) 153.0    No results found for: VD25OH  Clinical ASCVD: Yes  The 10-year ASCVD risk score (Arnett DK, et al., 2019) is: 27.1%   Values used to calculate the score:     Age: 54 years     Sex: Male     Is Non-Hispanic African American: No     Diabetic: Yes     Tobacco smoker: No     Systolic Blood Pressure: 035 mmHg     Is BP treated: Yes     HDL Cholesterol: 56.4 mg/dL     Total Cholesterol: 135 mg/dL     Social History   Tobacco Use  Smoking Status Former   Types: Cigarettes   Quit date: 11/30/1983   Years since quitting: 38.0  Smokeless Tobacco Never   BP Readings from Last 3 Encounters:  12/18/21 110/64  11/22/21 116/64  11/17/21 122/78   Pulse Readings from Last 3 Encounters:  12/18/21 62  11/22/21 72  11/17/21 (!) 58   Wt Readings from Last 3 Encounters:  12/18/21 176 lb (79.8 kg)  11/22/21 181 lb 12.8 oz (82.5 kg)  11/17/21 171 lb 12.8 oz (77.9 kg)   BMI Readings from Last 3 Encounters:  12/18/21 25.99 kg/m  11/22/21 26.85 kg/m  11/17/21 25.37 kg/m    Assessment/Interventions: Review of patient past medical history, allergies, medications, health status, including review of consultants reports, laboratory and other test data, was performed as part of comprehensive evaluation and provision of chronic care management services.   SDOH:   (Social Determinants of Health) assessments and interventions performed: Yes SDOH Interventions    Flowsheet Row Most Recent Value  SDOH Interventions   Food Insecurity Interventions Intervention Not Indicated  Financial Strain Interventions Other (Comment)  [Pursue Ozempic PAP]      SDOH Screenings   Alcohol Screen: Not on file  Depression (KKX3-8): Not on file  Financial Resource Strain: Medium Risk   Difficulty of Paying Living Expenses: Somewhat hard  Food Insecurity: No Food Insecurity   Worried About Running Out of Food in the Last Year: Never true   Ran Out of Food in the Last Year: Never true  Housing: Not on file  Physical Activity: Not on file  Social  Connections: Not on file  Stress: Not on file  Tobacco Use: Medium Risk   Smoking Tobacco Use: Former   Smokeless Tobacco Use: Never   Passive Exposure: Not on file  Transportation Needs: Not on file    [place]  No Known Allergies   Medications Reviewed Today     Reviewed by Charlton Haws, [facility] (Pharmacist) on 12/24/21 at [place] List Status: <None>   Medication Order Taking? Sig Documenting Provider Last Dose Status Informant  colchicine 0.6 MG tablet [id number] Yes Take 1 tablet (0.6 mg total) by mouth daily. Medina-Vargas, Senaida Lange, NP Taking Active Self           Med Note Rolan Bucco Dec 24, 2021  4:30 PM)    Continuous Blood Gluc Receiver (FREESTYLE LIBRE 2 READER) [place] [id number] Yes 1 each by Does not apply route daily. Philemon Kingdom, MD Taking Active Self  Continuous Blood Gluc Sensor (FREESTYLE LIBRE 2 SENSOR) [place] [id number] Yes 1 each by Does not apply route every 14 (fourteen) days. Philemon Kingdom, MD Taking Active Self  ferrous sulfate 325 (65 FE) MG tablet [id number] Yes Take 1 tablet (325 mg total) by mouth daily with breakfast. Medina-Vargas, Monina C, NP Taking Active Self  furosemide (LASIX) 40 MG tablet [id number] Yes Take 1 tablet (40 mg total) by mouth daily.  Milus Banister, MD Taking Active   glucose blood (ONE TOUCH ULTRA TEST) test strip [id number] Yes Use to test blood sugar 2 times daily as instructed. Dx: E11.59, E11.65 Philemon Kingdom, MD Taking Active Self  Homeopathic Products (LIVER SUPPORT SL) [id number] Yes Place under the tongue. [provider] Taking Active   HYDROcodone-acetaminophen (NORCO/VICODIN) 5-325 MG tablet [id number]  Take 1 tablet by mouth every 6 (six) hours as needed. [provider]  Active Self           Med Note Rolan Bucco Dec 24, 2021  4:30 PM)    insulin NPH Human (NOVOLIN N) 100 UNIT/ML injection [id number] Yes Inject 0.3 mLs (30 Units total) into the skin 2 (two) times daily before a meal. Sidney Ace, MD Taking Active   Insulin Pen Needle 32G X 4 MM MISC [id number] Yes Use 4x a day - with insulin Philemon Kingdom, MD Taking Active Self  Insulin Syringe-Needle U-100 (RELION INSULIN SYRINGE) 31G X 15/64" 0.5 ML MISC [id number] Yes USE  TWICE DAILY Philemon Kingdom, MD Taking Active Self  metFORMIN (GLUCOPHAGE) 1000 MG tablet [id number] Yes Take 1 tablet (1,000 mg total) by mouth daily with supper. Philemon Kingdom, MD Taking Active   Multiple Vitamin (MULTIVITAMIN WITH MINERALS) TABS tablet [id number] Yes Take 1 tablet by mouth at bedtime. [provider] Taking Active Self  Jonetta Speak LANCETS [place] [id number] Yes Use to test blood sugar 2 times daily as instructed. Dx: E11.59, E11.65 Philemon Kingdom, MD Taking Active Self  pantoprazole (PROTONIX) 40 MG tablet [id number] Yes Take 1 tablet (40 mg total) by mouth 2 (two) times daily. Medina-Vargas, Senaida Lange, NP Taking Active Self           Med Note Rolan Bucco Dec 24, 2021  4:29 PM)    PARoxetine (PAXIL) 40 MG tablet [id number] Yes Take 1 tablet (40 mg total) by mouth every morning. Medina-Vargas, Senaida Lange, NP Taking Active Self           Med Note Rolan Bucco Dec 24, 2021  4:29 PM)     Polyethyl Glycol-Propyl Glycol (SYSTANE) 0.4-0.3 % SOLN [id number] Yes Place 2 drops into both eyes daily. [provider] Taking Active Self  propranolol (INDERAL) 80 MG tablet [id number] Yes Take 80 mg by mouth 2 (two) times daily. [provider] Taking Active Self  rosuvastatin (CRESTOR) 10 MG tablet [id number] Yes Take 1 tablet (10 mg total) by mouth daily. Medina-Vargas, Senaida Lange, NP Taking Active Self           Med Note Rolan Bucco Dec 24, 2021  4:29 PM)    Semaglutide,0.25 or 0.5MG/DOS, (OZEMPIC, 0.25 OR 0.5 MG/DOSE,) 2 MG/1.5ML SOPN [id number]  Inject 0.5 mg into the skin once a week. Philemon Kingdom, MD  Active   spironolactone (ALDACTONE) 100 MG tablet [id number] Yes Take 1 tablet (100 mg total) by mouth daily. Milus Banister, MD Taking Active   traMADol Veatrice Bourbon) 50 MG tablet [id number] Yes Take 1 tablet (50 mg total) by mouth every 6 (six) hours as needed. Nickola Major, NP Taking Active Self           Med Note Rolan Bucco Dec 24, 2021  4:29 PM)    triamcinolone cream (KENALOG) 0.1 % [id number]  APPLY TOPICALLY TO SCALP TWO TIMES DAILY AS NEEDED Medina-Vargas, Monina C, NP  Active Self            Patient Active Problem List   Diagnosis Date Noted   Hypoglycemia 08/21/3006   Acute metabolic encephalopathy 62/26/3335   Alcohol use disorder, moderate, dependence ([place]) 11/22/2021   Observed seizure-like activity ([place]) 11/22/2021   At risk for adverse drug event 09/14/2021   Fall 09/09/2021   Staphylococcal arthritis of left knee (HCC)    Knee pain 09/06/2021   Pseudogout of knee, left 09/06/2021   Cellulitis 45/62/5638   Alcoholic cirrhosis of liver without ascites ([place]) 09/06/2021   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 ([place]) 09/06/2021   Contusion of right elbow 08/21/2021   Malnutrition of mild degree ([place]) 08/21/2021   Myelopathy concurrent with and due to spinal stenosis of thoracic region [facility])  06/19/2021   Actinic keratoses 05/22/2021   BPH associated with nocturia 06/10/2020   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[place]) 12/11/2019   Hip pain 08/07/2019   History of esophageal varices 93/73/4287   Alcoholic cirrhosis of liver with ascites ([place])    Secondary esophageal varices without bleeding ([place])    Macrocytic anemia 08/31/2018   Overweight 09/13/2017   Poorly controlled type 2 diabetes mellitus with circulatory disorder ([place]) 01/18/2017   Medial epicondylitis of elbow, left 08/03/2016   Elevated liver function tests 07/13/2016   Thrombocytopenia ([place]) 07/13/2016   Trigger middle finger of left hand 03/02/2016   Bilateral hand pain 12/02/2015   Primary osteoarthritis of both first carpometacarpal joints 12/02/2015   Advance directive discussed with patient 05/16/2015   Routine general medical examination at a health care facility 02/16/2011   Erectile dysfunction 02/16/2011   Essential hypertension 12/21/2010   Hyperlipidemia LDL goal <70 06/24/2009   Osteoarthritis, multiple sites 04/16/2009   CAD (coronary artery disease) 02/04/2009   Major depressive disorder, recurrent episode ([place]) 08/21/2008   ALLERGIC RHINITIS 08/21/2008   Sleep apnea 08/21/2008    Immunization History  Administered Date(s) Administered   Fluad Quad(high Dose 65+) 08/07/2019, 10/02/2020, 08/21/2021   Hep A / Hep B 04/02/2020, 04/09/2020, 05/01/2020   Influenza Split 08/30/2011, 09/18/2012   Influenza Whole 08/21/2008, 10/02/2009, 08/18/2010  Influenza, High Dose Seasonal PF 08/29/2018   Influenza,inj,Quad PF,6+ Mos 11/05/2013, 09/12/2015, 08/27/2016, 09/02/2017   Influenza-Unspecified 11/05/2013, 09/12/2015, 08/27/2016, 09/02/2017   PFIZER Comirnaty(Gray Top)Covid-19 Tri-Sucrose Vaccine 01/28/2020, 02/28/2020   PFIZER(Purple Top)SARS-COV-2 Vaccination 01/28/2020, 02/28/2020   Pneumococcal Conjugate-13 10/09/2014   Pneumococcal Polysaccharide-23 02/11/2010, 01/18/2017   Td 11/29/2002    Tdap 11/05/2013    Conditions to be addressed/monitored:  Hypertension, Hyperlipidemia, Diabetes, and Depression, Cirrhosis   Care Plan : [place]  Updates made by Charlton Haws, [place] since 12/24/2021 12:00 AM     Problem: Hypertension, Hyperlipidemia, Diabetes, and Depression, Cirrhosis      Long-Range Goal: Disease Management   Start Date: 06/08/2021  Expected End Date: 12/24/2022  This Visit's Progress: On track  Priority: High  Note:   Current Barriers:  Unable to independently afford treatment regimen Unable to independently monitor therapeutic efficacy  Pharmacist Clinical Goal(s):  Patient will verbalize ability to afford treatment regimen achieve adherence to monitoring guidelines and medication adherence to achieve therapeutic efficacy through collaboration with PharmD and provider.   Interventions: 1:1 collaboration with [name], MD regarding development and update of comprehensive plan of care as evidenced by provider attestation and co-signature Inter-disciplinary care team collaboration (see longitudinal plan of care) Comprehensive medication review performed; medication list updated in electronic medical record  Hypertension (BP goal <140/90) -Controlled - BP is at goal during office visits; given comorbid cirrhosis diuretic regimen and beta blocker below are appropriate -Current treatment: Spironolactone 100 mg daily - Appropriate, Effective, Safe, Accessible Furosemide 40 mg daily -Appropriate, Effective, Safe, Accessible Propranolol 80 mg BID -Appropriate, Effective, Safe, Accessible Metoprolol tartrate 50 mg BID - verified not taking, removed from list -Medications previously tried: losartan  -Current home readings: none, he plans to purchase a home BP monitor -Denies hypotensive/hypertensive symptoms -Educated on Importance of home blood pressure monitoring; -Counseled to monitor BP at home periodically -Recommended to  continue current medication  Diabetes (A1c goal <7%) -Controlled - A1c 5.8, somewhat over-controlled with recent ED visit for hypoglycemia -Managed by endocrinology (Dr Cruzita Lederer) -Current home glucose readings fasting glucose: 90s-100s; up to 170 rarely -Current medications: Metformin 1000 mg daily w/ dinner - Appropriate, Effective, Safe, Accessible Ozempic 0.5 mg weekly -Appropriate, Effective, Safe, Not Accessible (pt never picked up due to high cost) Novolin NPH - 30 units AM, 30 units HS -Appropriate, Effective, Query Safe, Accessible Humalog - Inject 6-8 units before meals -Appropriate, Effective, Query Safe, Accessible Freestyle Libre 2 - not started yet -Medications previously tried: none reported  -Denies hypoglycemic/hyperglycemic symptoms; -Educated on A1c and blood sugar goals; Prevention and management of hypoglycemic episodes -Assessed pt finances - pt may qualify for Ozempic PAP; emailed forms to him and he will bring them to upcoming Endocrine appt -Discussed benefits of Freestyle Libre including detecting/preventing lows; pt is not able to figure out how to use it himself, advised him to bring device to upcoming Endocrine appt -Recommended continue follow up with endocrinology  Hyperlipidemia: (LDL goal < 70) -Controlled - LDL is at goal; pt endorses compliance with statin -Hx CAD -Current treatment: Rosuvastatin 10 mg daily - Appropriate, Effective, Safe, Accessible -Medications previously tried: n/a  -Educated on Cholesterol goals; Benefits of statin for ASCVD risk reduction; -Recommended to continue current medication  Cirrhosis (Goal: slow progression) -Query Controlled - ascites may be worse per recent GI visit, ultrasound pending 01/03/2023 -Dx 2019, ETOH related. MELD 8 from 11/2021; esophageal varices present -Current treatment  Furosemide 40 mg daily - Appropriate, Effective,  Safe, Accessible Spironolactone 100 mg daily - Appropriate, Effective, Safe,  Accessible Propranolol 80 mg BID - Appropriate, Effective, Safe, Accessible Liver Support supplement -Discussed benefits of diuretics and propranolol for managing complications of cirrhosis -Recommended to continue current medication  Depression (Goal: manage symptoms) -Controlled - per pt report -PHQ, GAD-7 not on file -Current treatment: Paroxetine 40 mg daily -Medications previously tried/failed: venlafaxine -Educated on Benefits of medication for symptom control -Will need to be cautious with changing SSRI/SNRI in future - several are not able to be used in cirrhosis (particularly duloxetine - avoid in cirrhosis) -Recommended to continue current medication  Patient Goals/Self-Care Activities Patient will:  - take medications as prescribed as evidenced by patient report and record review focus on medication adherence by routine check glucose daily, document, and provide at future appointments check blood pressure periodically, document, and provide at future appointments collaborate with provider on medication access solutions (Ozempic)      Medication Assistance: None required.  Patient affirms current coverage meets needs.  Compliance/Adherence/Medication fill history: Care Gaps: Diabetic foot exam - completed 08/2020 Diabetic eye exam - completed 11/2019, due 11/2021  Star-Rating Drugs: Medication:                            Last Fill:         Day Supply Metformin 1000 mg                 11/24/2021      90 PDC 91%        Ozempic 0.5 mg               07/28/2021      28 PDC 78%  - Patient stopped using in December due to cost Rosuvastatin 10 mg                11/11/2021      90 [place] 67%  Patient's preferred pharmacy is: [facility] [address] [place] [address] Phone: [phone] Fax: [phone]  Uses pill box? No - has not needed one yet Pt endorses 100% compliance  We discussed: Current pharmacy is  preferred with insurance plan and patient is satisfied with pharmacy services Patient decided to: Continue current medication management strategy  Care Plan and Follow Up Patient Decision:  Patient agrees to Care Plan and Follow-up.  Follow Up Plan: Telephone follow up appointment with care management team member scheduled for:  3 months  Charlene Brooke, PharmD, BCACP Clinical Pharmacist [facility] [phone]

## 2021-12-26 ENCOUNTER — Other Ambulatory Visit: Payer: Self-pay

## 2021-12-26 ENCOUNTER — Emergency Department
Admission: EM | Admit: 2021-12-26 | Discharge: 2021-12-26 | Disposition: A | Discharge status: Home / Self Care | Payer: Medicare Other | Attending: Emergency Medicine | Admitting: Emergency Medicine

## 2021-12-26 ENCOUNTER — Emergency Department: Payer: Medicare Other

## 2021-12-26 DIAGNOSIS — Z20822 Contact with and (suspected) exposure to covid-19: Secondary | ICD-10-CM | POA: Insufficient documentation

## 2021-12-26 DIAGNOSIS — F1010 Alcohol abuse, uncomplicated: Secondary | ICD-10-CM

## 2021-12-26 DIAGNOSIS — X58XXXA Exposure to other specified factors, initial encounter: Secondary | ICD-10-CM | POA: Insufficient documentation

## 2021-12-26 DIAGNOSIS — E162 Hypoglycemia, unspecified: Secondary | ICD-10-CM

## 2021-12-26 DIAGNOSIS — M47812 Spondylosis without myelopathy or radiculopathy, cervical region: Secondary | ICD-10-CM | POA: Diagnosis not present

## 2021-12-26 DIAGNOSIS — E161 Other hypoglycemia: Secondary | ICD-10-CM | POA: Diagnosis not present

## 2021-12-26 DIAGNOSIS — M25562 Pain in left knee: Secondary | ICD-10-CM | POA: Insufficient documentation

## 2021-12-26 DIAGNOSIS — E11649 Type 2 diabetes mellitus with hypoglycemia without coma: Secondary | ICD-10-CM | POA: Insufficient documentation

## 2021-12-26 DIAGNOSIS — Z23 Encounter for immunization: Secondary | ICD-10-CM | POA: Diagnosis not present

## 2021-12-26 DIAGNOSIS — I2510 Atherosclerotic heart disease of native coronary artery without angina pectoris: Secondary | ICD-10-CM | POA: Diagnosis not present

## 2021-12-26 DIAGNOSIS — G40909 Epilepsy, unspecified, not intractable, without status epilepticus: Secondary | ICD-10-CM | POA: Diagnosis not present

## 2021-12-26 DIAGNOSIS — R404 Transient alteration of awareness: Secondary | ICD-10-CM | POA: Diagnosis not present

## 2021-12-26 DIAGNOSIS — S51812A Laceration without foreign body of left forearm, initial encounter: Secondary | ICD-10-CM | POA: Insufficient documentation

## 2021-12-26 DIAGNOSIS — S0083XA Contusion of other part of head, initial encounter: Secondary | ICD-10-CM | POA: Insufficient documentation

## 2021-12-26 DIAGNOSIS — S00512A Abrasion of oral cavity, initial encounter: Secondary | ICD-10-CM | POA: Insufficient documentation

## 2021-12-26 DIAGNOSIS — S51811A Laceration without foreign body of right forearm, initial encounter: Secondary | ICD-10-CM | POA: Insufficient documentation

## 2021-12-26 DIAGNOSIS — S40021A Contusion of right upper arm, initial encounter: Secondary | ICD-10-CM | POA: Diagnosis not present

## 2021-12-26 DIAGNOSIS — R569 Unspecified convulsions: Secondary | ICD-10-CM | POA: Insufficient documentation

## 2021-12-26 DIAGNOSIS — I10 Essential (primary) hypertension: Secondary | ICD-10-CM | POA: Insufficient documentation

## 2021-12-26 DIAGNOSIS — T148XXA Other injury of unspecified body region, initial encounter: Secondary | ICD-10-CM

## 2021-12-26 DIAGNOSIS — S59911A Unspecified injury of right forearm, initial encounter: Secondary | ICD-10-CM | POA: Diagnosis present

## 2021-12-26 LAB — CBC WITH DIFFERENTIAL/PLATELET
Abs Immature Granulocytes: 0.06 10*3/uL (ref 0.00–0.07)
Basophils Absolute: 0 10*3/uL (ref 0.0–0.1)
Basophils Relative: 0 %
Eosinophils Absolute: 0 10*3/uL (ref 0.0–0.5)
Eosinophils Relative: 1 %
HCT: 30.5 % — ABNORMAL LOW (ref 39.0–52.0)
Hemoglobin: 10.1 g/dL — ABNORMAL LOW (ref 13.0–17.0)
Immature Granulocytes: 1 %
Lymphocytes Relative: 12 %
Lymphs Abs: 0.8 10*3/uL (ref 0.7–4.0)
MCH: 34.2 pg — ABNORMAL HIGH (ref 26.0–34.0)
MCHC: 33.1 g/dL (ref 30.0–36.0)
MCV: 103.4 fL — ABNORMAL HIGH (ref 80.0–100.0)
Monocytes Absolute: 0.9 10*3/uL (ref 0.1–1.0)
Monocytes Relative: 14 %
Neutro Abs: 5 10*3/uL (ref 1.7–7.7)
Neutrophils Relative %: 72 %
Platelets: 122 10*3/uL — ABNORMAL LOW (ref 150–400)
RBC: 2.95 MIL/uL — ABNORMAL LOW (ref 4.22–5.81)
RDW: 16.4 % — ABNORMAL HIGH (ref 11.5–15.5)
WBC: 6.9 10*3/uL (ref 4.0–10.5)
nRBC: 0 % (ref 0.0–0.2)

## 2021-12-26 LAB — COMPREHENSIVE METABOLIC PANEL
ALT: 40 U/L (ref 0–44)
AST: 87 U/L — ABNORMAL HIGH (ref 15–41)
Albumin: 3.4 g/dL — ABNORMAL LOW (ref 3.5–5.0)
Alkaline Phosphatase: 118 U/L (ref 38–126)
Anion gap: 7 (ref 5–15)
BUN: 27 mg/dL — ABNORMAL HIGH (ref 8–23)
CO2: 26 mmol/L (ref 22–32)
Calcium: 8.9 mg/dL (ref 8.9–10.3)
Chloride: 101 mmol/L (ref 98–111)
Creatinine, Ser: 1 mg/dL (ref 0.61–1.24)
GFR, Estimated: >60 mL/min (ref >60)
Glucose, Bld: 65 mg/dL — ABNORMAL LOW (ref 70–99)
Potassium: 4.6 mmol/L (ref 3.5–5.1)
Sodium: 134 mmol/L — ABNORMAL LOW (ref 135–145)
Total Bilirubin: 0.9 mg/dL (ref 0.3–1.2)
Total Protein: 7.8 g/dL (ref 6.5–8.1)

## 2021-12-26 LAB — CBG MONITORING, ED
Glucose-Capillary: 58 mg/dL — ABNORMAL LOW (ref 70–99)
Glucose-Capillary: 115 mg/dL — ABNORMAL HIGH (ref 70–99)

## 2021-12-26 LAB — TSH: TSH: 4.496 u[IU]/mL (ref 0.350–4.500)

## 2021-12-26 LAB — MAGNESIUM: Magnesium: 2.2 mg/dL (ref 1.7–2.4)

## 2021-12-26 LAB — ETHANOL: Alcohol, Ethyl (B): <10 mg/dL (ref <10)

## 2021-12-26 LAB — PROCALCITONIN: Procalcitonin: <0.1 ng/mL

## 2021-12-26 LAB — RESP PANEL BY RT-PCR (FLU A&B, COVID) ARPGX2
Influenza A by PCR: NEGATIVE
Influenza B by PCR: NEGATIVE
SARS Coronavirus 2 by RT PCR: NEGATIVE

## 2021-12-26 MED ORDER — LORAZEPAM 1 MG PO TABS: 1.0000 mg | ORAL_TABLET | ORAL | Status: DC | PRN

## 2021-12-26 MED ORDER — DEXTROSE 10 % IV SOLN: INTRAVENOUS | Status: DC

## 2021-12-26 MED ORDER — SODIUM CHLORIDE 0.9 % IV SOLN: 250.0000 mL | INTRAVENOUS | Status: DC | PRN

## 2021-12-26 MED ORDER — FOLIC ACID 1 MG PO TABS
1.0000 mg | ORAL_TABLET | Freq: Every day | ORAL | Status: DC
  Administered 2021-12-26: 1 mg via ORAL
  Filled 2021-12-26: qty 1

## 2021-12-26 MED ORDER — TETANUS-DIPHTH-ACELL PERTUSSIS 5-2.5-18.5 LF-MCG/0.5 IM SUSY
0.5000 mL | PREFILLED_SYRINGE | Freq: Once | INTRAMUSCULAR | Status: AC
  Administered 2021-12-26: 0.5 mL via INTRAMUSCULAR
  Filled 2021-12-26: qty 0.5

## 2021-12-26 MED ORDER — SODIUM CHLORIDE 0.9% FLUSH: 3.0000 mL | INTRAVENOUS | Status: DC | PRN

## 2021-12-26 MED ORDER — DEXTROSE 50 % IV SOLN
1.0000 | Freq: Once | INTRAVENOUS | Status: AC
  Administered 2021-12-26: 50 mL via INTRAVENOUS

## 2021-12-26 MED ORDER — THIAMINE HCL 100 MG/ML IJ SOLN
100.0000 mg | Freq: Every day | INTRAMUSCULAR | Status: DC
  Administered 2021-12-26: 100 mg via INTRAVENOUS
  Filled 2021-12-26: qty 2

## 2021-12-26 MED ORDER — SODIUM CHLORIDE 0.9% FLUSH
3.0000 mL | Freq: Two times a day (BID) | INTRAVENOUS | Status: DC
  Administered 2021-12-26: 3 mL via INTRAVENOUS

## 2021-12-26 MED ORDER — ADULT MULTIVITAMIN W/MINERALS CH: 1.0000 | ORAL_TABLET | Freq: Every day | ORAL | Status: DC

## 2021-12-26 MED ORDER — LORAZEPAM 2 MG/ML IJ SOLN: 1.0000 mg | INTRAMUSCULAR | Status: DC | PRN

## 2021-12-26 MED ORDER — THIAMINE HCL 100 MG PO TABS: 100.0000 mg | ORAL_TABLET | Freq: Every day | ORAL | Status: DC

## 2021-12-26 NOTE — ED Notes (Signed)
Pt alert and eating breakfast.

## 2021-12-26 NOTE — ED Triage Notes (Signed)
BIB EMS from home. unwitnessed seizure.  history of seizure and DM.   38 BGL  received d10 1mg  Versed Glucagon IM 18 right EJ

## 2021-12-26 NOTE — ED Notes (Signed)
RN attempted to call brother. No answer at this time.

## 2021-12-26 NOTE — ED Notes (Signed)
Rn assisted pt to ambulate to toilet to have a BM>

## 2021-12-26 NOTE — ED Provider Notes (Signed)
[facility] Provider Note    Event Date/Time   First MD Initiated Contact with Patient 12/26/21 [phone]     (approximate)   History   Seizures   HPI  David [name] is a 71 y.o. male  with a PMH of etoh abuse (pt endorses around 5 drinks per day), cirrhosis, esophageal varices, HTN ,diabetes, CAD s/p stents, history of septic knee in October 2022, insulin-dependent type 2 diabetes with recurrent episodes of hypoglycemia over the past couple months most recently admitted 12/24-12/5 following a seizure thought to be related hypoglycemia who presents EMS from home after he was found by roommate with generalized shaking concerning for seizure.  Per EMS he seemed to have seizure-like activity on arrival and he was given 1 mg of IV Versed.  In addition he was found to have a CBG of 38 and was given IM glucagon and started on D10 drip.  On arrival patient states he does not remember exactly what happens other than "fading out".  He states he thinks he took his typical insulin regimen last night and is not sure when he had his last drink was.  He states he typically takes 30 units of NPH twice a day and thinks he took it last night but is not sure if he had any food after this.  He states he is sore in his arms and has some pain in his left knee but that he always has pain in his left knee.  He also endorses chronic pain in his back and does not think the pain in his back or knees any different today.  He denies being on any blood thinners using any illegal drugs.  States that prior to episodes last night he has been in his usual state health without any falls with last couple days, fevers, cough, vomiting, diarrhea, chest pain, shortness of breath, fevers, urinary symptoms or any other clear acute associated sick symptoms.  He is not on any seizure medicines and it seems that when he has had seizures in the past were always related to low blood sugar.      Physical Exam  Triage  Vital Signs: ED Triage Vitals  Enc Vitals Group     BP --      Pulse --      Resp --      Temp --      Temp src --      SpO2 --      Weight 12/26/21 0812 174 lb 2.6 oz (79 kg)     Height 12/26/21 0812 5\' 9"  (1.753 m)     Head Circumference --      Peak Flow --      Pain Score 12/26/21 0808 0     Pain Loc --      Pain Edu? --      Excl. in [place]? --     Most recent vital signs: Vitals:   12/26/21 1030 12/26/21 1056  BP: 135/78   Pulse: 74   Resp: 16   Temp:  97.8 F (36.6 C)  SpO2: 100%     General: Awake, no distress.  CV:  Good peripheral perfusion.  2+ radial and DP pulses. Resp:  Normal effort.  Clear bilaterally.  No significant distress. Abd:  No distention.  Reducible umbilical hernia.  No tenderness Other:  Patient has fairly diffuse skin tears over his bilateral forearms and hands.  He has symmetric grip strength and sensation is intact in  the distribution of the radial ulnar median nerves.  He has full range of motion and strength at the elbows and wrists no large effusion or point tenderness with mild snuffbox areas.  He has no tenderness over to the C/T/L-spine.  There is a little bruising on the upper right arm and right face but patient is no tenderness over those areas.  Cranial nerves II through XII are grossly intact.  There is an abrasion of the tongue but no active bleeding and remainder of oropharynx is unremarkable.  No other obvious trauma to the face scalp head or neck.  Patient is not slurring his words but is not oriented to year but is oriented to month.  No lower extremities he is able to move his right lower extremity with full strength and range of motion but is somewhat pain limited in the left lower extremity at the left knee which he states is chronic.  Otherwise has symmetric strength in hip and ankle.   ED Results / Procedures / Treatments  Labs (all labs ordered are listed, but only abnormal results are displayed) Labs Reviewed  CBC WITH  DIFFERENTIAL/PLATELET - Abnormal; Notable for the following components:      Result Value   RBC 2.95 (*)    Hemoglobin 10.1 (*)    HCT 30.5 (*)    MCV 103.4 (*)    MCH 34.2 (*)    RDW 16.4 (*)    Platelets 122 (*)    All other components within normal limits  COMPREHENSIVE METABOLIC PANEL - Abnormal; Notable for the following components:   Sodium 134 (*)    Glucose, Bld 65 (*)    BUN 27 (*)    Albumin 3.4 (*)    AST 87 (*)    All other components within normal limits  CBG MONITORING, ED - Abnormal; Notable for the following components:   Glucose-Capillary 58 (*)    All other components within normal limits  CBG MONITORING, ED - Abnormal; Notable for the following components:   Glucose-Capillary 115 (*)    All other components within normal limits  RESP PANEL BY RT-PCR (FLU A&B, COVID) ARPGX2  ETHANOL  TSH  PROCALCITONIN  MAGNESIUM  URINALYSIS, COMPLETE (UACMP) WITH MICROSCOPIC  CBG MONITORING, ED  CBG MONITORING, ED     EKG  ECG is remarkable for sinus rhythm with a ventricular rate of 68, normal axis, unremarkable intervals without clear evidence of acute ischemia or significant arrhythmia   RADIOLOGY   CT head and C-spine ordered and reviewed by myself shows no acute skull fracture, intracranial hemorrhage or acute C-spine fracture or traumatic subluxation.  There appears some degenerative changes in the C-spine.  Chest reviewed by myself shows no focal consoidation, effusion, edema, pneumothorax or other clear acute thoracic process. I also reviewed radiology interpretation and agree with findings described.  Plain film left knee interpreted by myself shows no acute fracture or dislocation on plain film of the left knee.  Also reviewed radiology interpretation and agree with the findings  PROCEDURES:  Critical Care performed: No  Procedures    MEDICATIONS ORDERED IN ED: Medications  sodium chloride flush (NS) 0.9 % injection 3 mL (3 mLs Intravenous Given  12/26/21 1021)  sodium chloride flush (NS) 0.9 % injection 3 mL (has no administration in time range)  0.9 %  sodium chloride infusion (has no administration in time range)  LORazepam (ATIVAN) tablet 1-4 mg (has no administration in time range)    Or  LORazepam (ATIVAN)  injection 1-4 mg (has no administration in time range)  thiamine tablet 100 mg ( Oral See Alternative 12/26/21 1024)    Or  thiamine (B-1) injection 100 mg (100 mg Intravenous Given 3/66/29 4765)  folic acid (FOLVITE) tablet 1 mg (1 mg Oral Given 12/26/21 0941)  multivitamin with minerals tablet 1 tablet (1 tablet Oral Patient Refused/Not Given 12/26/21 1024)  Tdap (BOOSTRIX) injection 0.5 mL (0.5 mLs Intramuscular Given 12/26/21 0939)  dextrose 50 % solution 50 mL (50 mLs Intravenous Given 12/26/21 0814)     IMPRESSION / MDM / ASSESSMENT AND PLAN / ED COURSE  I reviewed the triage vital signs and the nursing notes.                              Differential diagnosis includes, but is not limited to seizure related to hypoglycemia, alcohol withdrawal, other metabolic derangements, SAH versus possible underlying seizure disorder versus possible polypharmacy.  Given episode today was witnessed with generalized shaking I have a lower suspicion for syncope although is possible patient had a convulsive syncopal episode.  Will initial SPO2 was read as 91% on monitor placed on patient's via repeat check on fingers 99%.  I think this is more accurate and has a good plethysmography waveform.  Patient is denying any chest pain shortness of breath or cough and I think initial reading was likely spuriously low suspicion for hypoxia or respiratory failure at this time.  I suspect superficial bruising to the forearms and hands and skin tears.  There is no decreased strength range of motion effusion edema deformity or any point tenderness over the joints or anywhere else.  He is otherwise neurovascular intact in the bilateral upper extremities.   Wounds were cleaned irrigated and tetanus updated.  Nonadherent dressings placed.  In addition to reviewing his prior records including his discharge summary from 12/25 I reviewed GI visit on 1/20 and PT visit from 1/26.  Seems patient recently had his diuretics increased at the visit on 1/20 with increased to Lasix 40 mg daily and Aldactone 100 mg daily.  ECG is remarkable for sinus rhythm with a ventricular rate of 68, normal axis, unremarkable intervals without clear evidence of acute ischemia or significant arrhythmia.  CMP remarkable for glucose of 65 and an AST of 87 without any other significant electrolyte or metabolic derangements.  CBC shows no leukocytosis and stable hemoglobin at 10.1 compared to 10 8 days ago.  There is evidence of stable thrombocytopenia with platelets of 122 compared to 125 2 days ago.  Serum ethanol is undetectable.  Magnesium is 2.2.  Procalcitonin undetectable.  Ethanol is undetectable.  TSH is within normal limits  CT head and C-spine ordered and reviewed by myself shows no acute skull fracture, intracranial hemorrhage or acute C-spine fracture or traumatic subluxation.  There appears some degenerative changes in the C-spine.  Chest reviewed by myself shows no focal consoidation, effusion, edema, pneumothorax or other clear acute thoracic process. I also reviewed radiology interpretation and agree with findings described.  Plain film left knee interpreted by myself shows no acute fracture or dislocation on plain film of the left knee.  Also reviewed radiology interpretation and agree with the findings.  On reassessment patient is much more awake and alert and oriented x4.  He is able to ambulate around the emergency room without any difficulties.  He is also able to eat without any difficulty and subsequently had a temperature and  blood sugar rechecked.  He is now normothermic and his sugar is within normal limits.  Suspect likely hypoglycemia related to insulin  usage and inadequate p.o. intake in the setting of alcohol abuse.  Counseled patient extensively on this.  Also discontinue his tramadol as this can previous procedures.  Do not believe he requires further monitoring or initiation of antiepileptics at this time.  Discussed dangers of taking insulin and drinking alcohol at the same time.  Advised him to hold off on any additional insulin until he can discuss with his PCP in 24 to 40 hours.  He is amenable this plan.  He is not ready stop drinking or seek inpatient counseling start Librium at this time.  Considered admission for observation given history of alcohol abuse and recurrent falls from hyperglycemia related to this but given stable vitals with patient now awake clinically sober and tolerating p.o. with stable sugar I think is stable for discharge with continued outpatient evaluation.  Discharged stable condition.  Strict return precautions advised and discussed.        FINAL CLINICAL IMPRESSION(S) / ED DIAGNOSES   Final diagnoses:  Seizure ([place])  Hypoglycemia  Multiple skin tears  Alcohol abuse     Rx / DC Orders   ED Discharge Orders     None        Note:  This document was prepared using Dragon voice recognition software and may include unintentional dictation errors.   Lucrezia Starch, MD 12/26/21 1115

## 2021-12-26 NOTE — ED Notes (Signed)
Brother Jenny Reichmann called to check on status of pt. Pt gave permission for RN to talk to him. He also advised if pt was discharged he would come get him.

## 2021-12-28 ENCOUNTER — Telehealth: Payer: Self-pay

## 2021-12-28 NOTE — Telephone Encounter (Signed)
His insulin therapy does need to be reviewed in light of his recent ER visit and failure to adjust insulin when he is drinking and doesn't eat

## 2021-12-28 NOTE — Telephone Encounter (Signed)
Called pt. Left message advising pt to make sure to keep the 12-31-21 OV with Dr Cruzita Lederer and he is scheduled to see Dr Silvio Pate on 01-12-22. Suggested he call if he has any problems prior to his 2 appointments.

## 2021-12-29 ENCOUNTER — Ambulatory Visit (HOSPITAL_COMMUNITY)
Admission: RE | Admit: 2021-12-29 | Discharge: 2021-12-29 | Disposition: A | Discharge status: Home / Self Care | Payer: Medicare Other | Source: Ambulatory Visit | Attending: Gastroenterology | Admitting: Gastroenterology

## 2021-12-29 ENCOUNTER — Other Ambulatory Visit: Payer: Self-pay

## 2021-12-29 DIAGNOSIS — R188 Other ascites: Secondary | ICD-10-CM | POA: Diagnosis not present

## 2021-12-29 DIAGNOSIS — K7030 Alcoholic cirrhosis of liver without ascites: Secondary | ICD-10-CM | POA: Insufficient documentation

## 2021-12-29 DIAGNOSIS — E1169 Type 2 diabetes mellitus with other specified complication: Secondary | ICD-10-CM

## 2021-12-29 DIAGNOSIS — E785 Hyperlipidemia, unspecified: Secondary | ICD-10-CM | POA: Diagnosis not present

## 2021-12-29 DIAGNOSIS — I10 Essential (primary) hypertension: Secondary | ICD-10-CM | POA: Diagnosis not present

## 2021-12-29 DIAGNOSIS — Z7984 Long term (current) use of oral hypoglycemic drugs: Secondary | ICD-10-CM

## 2021-12-29 DIAGNOSIS — Z794 Long term (current) use of insulin: Secondary | ICD-10-CM

## 2021-12-29 DIAGNOSIS — K7689 Other specified diseases of liver: Secondary | ICD-10-CM | POA: Diagnosis not present

## 2021-12-29 DIAGNOSIS — F32A Depression, unspecified: Secondary | ICD-10-CM | POA: Diagnosis not present

## 2021-12-29 DIAGNOSIS — K7460 Unspecified cirrhosis of liver: Secondary | ICD-10-CM | POA: Diagnosis not present

## 2021-12-31 ENCOUNTER — Ambulatory Visit: Payer: Medicare Other | Admitting: Dietician

## 2021-12-31 ENCOUNTER — Ambulatory Visit: Payer: Medicare Other | Admitting: Internal Medicine

## 2021-12-31 ENCOUNTER — Encounter: Payer: Self-pay | Admitting: Internal Medicine

## 2021-12-31 ENCOUNTER — Other Ambulatory Visit: Payer: Self-pay

## 2021-12-31 VITALS — BP 130/70 | HR 73 | Ht 69.0 in | Wt 161.0 lb

## 2021-12-31 DIAGNOSIS — E1159 Type 2 diabetes mellitus with other circulatory complications: Secondary | ICD-10-CM

## 2021-12-31 DIAGNOSIS — E785 Hyperlipidemia, unspecified: Secondary | ICD-10-CM

## 2021-12-31 DIAGNOSIS — E1165 Type 2 diabetes mellitus with hyperglycemia: Secondary | ICD-10-CM | POA: Diagnosis not present

## 2021-12-31 MED ORDER — LANTUS SOLOSTAR 100 UNIT/ML ~~LOC~~ SOPN: 36.0000 [IU] | PEN_INJECTOR | Freq: Every day | SUBCUTANEOUS | 5 refills | Status: DC

## 2021-12-31 NOTE — Patient Instructions (Addendum)
Please change: - Metformin 1000 mg 2x a day, with meals  Please stop NPH and start:  - Lantus/Basaglar/Semglee/Levemir/Tresiba 35 units in am   Try to use: - Humalog 6-8 units 15 min before a larger meal  Try to start: - Please start Ozempic 0.25 mg weekly in a.m. (for example on Sunday morning) x 4 weeks, then increase to 0.5 mg weekly in a.m. if no nausea or hypoglycemia.  If you start Ozempic, stop Humalog.

## 2021-12-31 NOTE — Progress Notes (Signed)
[facility]   David [name] was educated today about the following:      -Reader device/ [employer]Link  app programmed -Setting up device (Target range 70-180  ) Alarms (High Alert: 70) Low Alert: 250) -Inserting sensor ( L arm   -  WNL) -Calibrating - none required but can confirm SG with finger stick BG -Ending sensor session - 14 day sensor use -Charging the battery of the reader -Trouble shooting/ contraindications -Tape guide - Downloading capabilities; [place] -Reviewed BG treatment decisions with [name]   Patient has [employer] tech support contact and my information for questions or concerns

## 2021-12-31 NOTE — Progress Notes (Signed)
Pt provided with 2 sample pens of Lantus 100u/ML  Lot# [id number] Exp Date: 07/29/22

## 2021-12-31 NOTE — Progress Notes (Signed)
Patient ID: David [name], male   DOB: 1951-11-08, 71 y.o.   MRN: [id number]   This visit occurred during the SARS-CoV-2 public health emergency.  Safety protocols were in place, including screening questions prior to the visit, additional usage of staff PPE, and extensive cleaning of exam room while observing appropriate contact time as indicated for disinfecting solutions.   HPI: David [name] is a 71 y.o.-year-old male, returning for f/u for DM2, dx in ~2008, insulin-dependent since 09/2015, uncontrolled, with complications (CAD - s/p stents in 2013, PN). Last visit 3 months ago.  Interim history: No increased urination, blurry vision, nausea, chest pain. He had 2 hypoglycemic seizures since last visit.  His sugars were in the 30s: Few weeks ago: Glu in the 30s - fell on carpet 5 days ago: had a Glu of 35 (had 3 beers, took insulin, and ate a very light meal-salad)- fell in the bathroom at night (3 am). Paramedics came and he was given glucagon and D5 drip.  Reviewed HbA1c levels: Lab Results  Component Value Date   HGBA1C 5.8 (A) 11/17/2021   HGBA1C 6.0 (A) 08/21/2021   HGBA1C 6.8 (H) 05/21/2021  03/05/2016: HbA1c 9.1%  He is on: - Metformin 1000 mg 2x a day, with meals -  NPH 30 units in a.m. and 35 units at night - Humalog 8-12 >> 16 >> 14-20 >> 16-20 >> 14-16 (he did not decrease the dose to 8-10 units as advised at last visit) He was on Ozempic from 01/2019 to 08/2019 but had to come off due to price.   He was on Bydureon 2 mg weekly - started 01/30/2017 >> stopped 04/2017 b/c price - could not restart  Pt checks his sugars twice a day per review of his log: - am: 108, 125-234, 256 >> 80, 122-204 >> 83-168, 212 >> 75-135, 152, 189 - 2h after b'fast: n/c >> 163 - before lunch: 85-187, 201 >> 158, 327 >> n/c  - 2h after lunch: n/c - before dinner: 113-258 >> 99-168, 185, 214 >> 80-167, 197 >> 89-64, 170 - 2h after dinner: n/c - bedtime:  126-209, 225-240 (candy), 261 >> 160-215  >> n/c  - nighttime: n/c Lowest sugar was 115 >> 108 >> 80 >> 25 (was active that day), OTW 40-50s >> 30s; it is unclear at which level he has hypoglycemia awareness. Highest sugar was 295 >> 287 >> 215 >> 184.  Glucometer: One Touch  Pt's meals are: - Breakfast: protein shake - Lunch: n/a - Dinner: meat + veggies /potatoes or salad - Snacks: no Diet sodas, ice tea with splenda.  No regular sodas.  -+ CKD, last BUN/creatinine:  Lab Results  Component Value Date   BUN 27 (H) 12/26/2021   CREATININE 1.00 12/26/2021  On losartan.  -+ HL; last set of lipids: Lab Results  Component Value Date   CHOL 135 07/14/2021   HDL 56.40 07/14/2021   LDLCALC 64 07/14/2021   LDLDIRECT 103.9 11/05/2013   TRIG 76.0 07/14/2021   CHOLHDL 2 07/14/2021  On Crestor 10.  - last eye exam was in 03/2021: No DR reportedly.  Has previous history of cataract surgery.  -he has numbness and tingling in his feet  He also has HTN, depression.  He has a history of anabolic steroid use for muscle building. He had a cardiac cath 08/2018 for presyncope: Stable coronary anatomy without high-grade coronary stenosis.  He is on medical therapy for CAD. He was found to be anemic (Hb 8.1) >> EGD  report reviewed:  - Non-bleeding grade II esophageal varices. - Non-bleeding gastric ulcers with a clean ulcer base (Forrest Class III). Biopsied. - Erythematous duodenopathy. - Normal second portion of the duodenum. In 2021: He and his wife of 40 years separated and he had a difficult time living outside his home. In 2021 he was also diagnosed with cirrhosis.  He does have a history of heavy alcohol use and he continues to use alcohol.  He has anemia and decreased platelets. He saw a chiropractor for his neuropathy.  He was using infrared light and felt that this was helping.   ROS: + see HPI Neurological: + tremors/+ numbness/+ tingling/no dizziness  I reviewed pt's medications, allergies, PMH, social hx, family hx,  and changes were documented in the history of present illness. Otherwise, unchanged from my initial visit note.   Past Medical History:  Diagnosis Date   Allergy    Anemia    Arthritis    Bilateral hands and feet   CAD (coronary artery disease)    s/p bifurcation stenting (DES x2) and stenting bifurcation lesion distal RCA (1 DES)   Cataract    Cirrhosis (HCC)    Depression    Diabetes mellitus    Esophageal varices (HCC)    GERD (gastroesophageal reflux disease)    Hyperlipemia    Hypertension    Myocardial infarction ([place])    Sleep apnea    CPAP   Past Surgical History:  Procedure Laterality Date   BIOPSY  09/01/2018   Procedure: BIOPSY;  Surgeon: Mauri Pole, MD;  Location: [facility];  Service: Endoscopy;;   CATARACT EXTRACTION Bilateral    CORONARY ANGIOPLASTY WITH STENT PLACEMENT   March 2010   Dr. Olevia Perches   ESOPHAGOGASTRODUODENOSCOPY (EGD) WITH PROPOFOL N/A 09/01/2018   Procedure: ESOPHAGOGASTRODUODENOSCOPY (EGD) WITH PROPOFOL;  Surgeon: Mauri Pole, MD;  Location: [facility];  Service: Endoscopy;  Laterality: N/A;   KNEE ARTHROSCOPY Left 09/07/2021   Procedure: IRRIGATION AND DEBRIDEMENT OF LEFT KNEE; ARTHROSCOPY OF LEFT KNEE, PARTIAL MEDIAL [place].;  Surgeon: [name], MD;  Location: [facility];  Service: Orthopedics;  Laterality: Left;   LEFT HEART CATH AND CORONARY ANGIOGRAPHY N/A 08/31/2018   Procedure: LEFT HEART CATH AND CORONARY ANGIOGRAPHY;  Surgeon: Sherren Mocha, MD;  Location: [facility];  Service: Cardiovascular;  Laterality: N/A;   THORACIC DISCECTOMY Right 06/19/2021   Procedure: Microdiscectomy - right - Thoracic Eleven-Thoracic Twelve;  Surgeon: Earnie Larsson, MD;  Location: [facility];  Service: Neurosurgery;  Laterality: Right;   TONSILLECTOMY  1959   Social History   Social History   Marital Status: Married    Spouse Name: N/A   Number of Children: 2   Occupational History   Patent examiner buildings     site Airline pilot    Social History Main Topics   Smoking status: Former Smoker    Types: Cigarettes    Quit date: 11/30/1983   Smokeless tobacco: Never Used   Alcohol Use:  beer, 3-4 times a week, 6-8 drinks at the time        Drug Use: No   Social History Narrative   No living will   Would want wife, then son [name], as health care POA   Would accept resuscitation   Not sure about tube feeds   Current Outpatient Medications on File Prior to Visit  Medication Sig Dispense Refill   colchicine 0.6 MG tablet Take 1 tablet (0.6 mg total) by mouth  daily. 30 tablet 0   Continuous Blood Gluc Receiver (FREESTYLE LIBRE 2 READER) DEVI 1 each by Does not apply route daily. 1 each 0   Continuous Blood Gluc Sensor (FREESTYLE LIBRE 2 SENSOR) MISC 1 each by Does not apply route every 14 (fourteen) days. 6 each 3   ferrous sulfate 325 (65 FE) MG tablet Take 1 tablet (325 mg total) by mouth daily with breakfast. 30 tablet 0   furosemide (LASIX) 40 MG tablet Take 1 tablet (40 mg total) by mouth daily. 30 tablet 3   glucose blood (ONE TOUCH ULTRA TEST) test strip Use to test blood sugar 2 times daily as instructed. Dx: E11.59, E11.65 100 each 5   Homeopathic Products (LIVER SUPPORT SL) Place under the tongue.     HYDROcodone-acetaminophen (NORCO/VICODIN) 5-325 MG tablet Take 1 tablet by mouth every 6 (six) hours as needed.     insulin NPH Human (NOVOLIN N) 100 UNIT/ML injection Inject 0.3 mLs (30 Units total) into the skin 2 (two) times daily before a meal. 10 mL    Insulin Pen Needle 32G X 4 MM MISC Use 4x a day - with insulin 200 each 3   Insulin Syringe-Needle U-100 (RELION INSULIN SYRINGE) 31G X 15/64" 0.5 ML MISC USE  TWICE DAILY 100 each 5   metFORMIN (GLUCOPHAGE) 1000 MG tablet Take 1 tablet (1,000 mg total) by mouth daily with supper. 90 tablet 3   Multiple Vitamin (MULTIVITAMIN WITH MINERALS) TABS tablet Take 1 tablet by mouth at bedtime.     ONETOUCH DELICA LANCETS FINE MISC Use to test  blood sugar 2 times daily as instructed. Dx: E11.59, E11.65 100 each 5   pantoprazole (PROTONIX) 40 MG tablet Take 1 tablet (40 mg total) by mouth 2 (two) times daily. 60 tablet 0   PARoxetine (PAXIL) 40 MG tablet Take 1 tablet (40 mg total) by mouth every morning. 30 tablet 0   Polyethyl Glycol-Propyl Glycol (SYSTANE) 0.4-0.3 % SOLN Place 2 drops into both eyes daily.     propranolol (INDERAL) 80 MG tablet Take 80 mg by mouth 2 (two) times daily.     rosuvastatin (CRESTOR) 10 MG tablet Take 1 tablet (10 mg total) by mouth daily. 30 tablet 0   Semaglutide,0.25 or 0.5MG /DOS, (OZEMPIC, 0.25 OR 0.5 MG/DOSE,) 2 MG/1.5ML SOPN Inject 0.5 mg into the skin once a week. 1.5 mL 5   spironolactone (ALDACTONE) 100 MG tablet Take 1 tablet (100 mg total) by mouth daily. 30 tablet 3   triamcinolone cream (KENALOG) 0.1 % APPLY TOPICALLY TO SCALP TWO TIMES DAILY AS NEEDED 30 g 0   No current facility-administered medications on file prior to visit.   No Known Allergies  Family History  Problem Relation Age of Onset   Heart failure Father        Deceased 11 y/o   Diabetes Father    Cancer Father        Prostate   Heart failure Mother        Deceased 33 y/o   Alcohol abuse Mother    Schizophrenia Son        Paranoid   Uveitis Son    Cancer Brother        prostate   Colon cancer Neg Hx    Esophageal cancer Neg Hx    Stomach cancer Neg Hx    Liver cancer Neg Hx    Pancreatic cancer Neg Hx    Rectal cancer Neg Hx    PE: BP 130/70 (BP  Location: Left Arm, Patient Position: Sitting, Cuff Size: Normal)    Pulse 73    Ht 5\' 9"  (1.753 m)    Wt 161 lb (73 kg)    SpO2 99%    BMI 23.78 kg/m   Wt Readings from Last 3 Encounters:  12/31/21 161 lb (73 kg)  12/26/21 174 lb 2.6 oz (79 kg)  12/18/21 176 lb (79.8 kg)   Constitutional: overweight, in NAD, walks with a walker Eyes: PERRLA, EOMI, no exophthalmos ENT: moist mucous membranes, no thyromegaly, no cervical lymphadenopathy Cardiovascular: RRR, No  MRG, + mild pitting edema in LE bilaterally Respiratory: CTA B Musculoskeletal: no deformities, strength intact in all 4 Skin: moist, warm, + extensive abrasions on arms-bandaged Neurological: + tremor with outstretched hands, DTR normal in all 4  ASSESSMENT: 1. DM2, insulin-dependent, uncontrolled, with complications - CAD - s/p 4 stents 2013 - seeing Dr Burt Knack - PN - Hypoglycemic seizures  Component     Latest Ref Rng 06/09/2016  C-Peptide     0.80-3.85 ng/mL 1.78  Glucose, Fasting     65 - 99 mg/dL 184 (H)  Glutamic Acid Decarb Ab     <5 IU/mL <5  Pancreatic Islet Cell Antibody     < 5 JDF Units <5   2. HL  PLAN:  1. Patient with longstanding, uncontrolled, type 2 diabetes, with increased variability of blood sugars, on metformin and basal/bolus insulin regimen.  His diabetes control is impaired by his continuing to drink alcohol. -Before last visit, he had a low blood sugar of 23 on regular labs (07/2021), and 25 (10/2021). -He was in rehab after he developed a left knee staph infection and had to have drainage.  He is regimen was simplified on discharge to only NPH insulin twice a day.  However, before our last visit, he added back metformin and Humalog. -At last visit, we decreased the dose of Humalog and we discussed about possibly starting Ozempic.  He could not start this due to price.  I advised him to let me know if he had more lows so that we can reduce his NPH doses.  I also strongly recommended a CGM and given a list of suppliers.  He was not able to start this. -Patient recently had 2 hypoglycemic seizures  -blood sugar 30s (11/21/2021) and 38 (drank alcohol and only ate a salad after taking the full dose of insulin, 12/26/2021) -At this visit, we discussed about the fact that NPH insulin may predispose him to lows.  I suggested to go back to an analog insulin.  We gave him 2 sample of Lantus pens and I advised him to take half of the insulin that he is taking now.  We will  only keep the dose of Lantus in the morning to avoid nocturnal hypoglycemia.  I did send a prescription for this to the pharmacy but I advised him that if this is not covered, we can work with any of the long-acting insulin analogs. -Reviewing his regimen, he appears to be taking a higher dose of Humalog than recommended at last visit.  We will decrease the dose now and I advised him to only take it if he has a large meal.  He will not needed before a small meal like a salad or half a sandwich.  I also recommended again to start Ozempic, which he thinks he may be able to get through the patient assistance program.  He has the paperwork, we will fill out his part of  the form and bring it back to [place].  I advised him to stop Humalog if he is able to start Ozempic. -At today's visit, he brought his freestyle libre CGM with him.  He was able to obtain this.  He will have it attached by the diabetes educator after the visit. -Last but not least, I strongly advised him to try his best to stop alcohol.  This can predispose him to lows even if he is eating well and does not take insulin.  In the setting of insulin use and if he is eating lighter meals, the risk of low blood sugars is very high. - I suggested to:  Patient Instructions  Please change: - Metformin 1000 mg 2x a day, with meals  Please stop NPH and start:  - Lantus/Basaglar/Semglee/Levemir/Tresiba 35 units in am   Try to use: - Humalog 6-8 units 15 min before a larger meal  Try to start: - Please start Ozempic 0.25 mg weekly in a.m. (for example on Sunday morning) x 4 weeks, then increase to 0.5 mg weekly in a.m. if no nausea or hypoglycemia.  If you start Ozempic, stop Humalog.  - advised to check sugars at different times of the day - 4x a day, rotating check times - advised for yearly eye exams >> he is UTD - return to clinic in 1 month     2. HL -Reviewed latest lipid panel from 06/2021: Fractions at goal: Lab Results  Component  Value Date   CHOL 135 07/14/2021   HDL 56.40 07/14/2021   LDLCALC 64 07/14/2021   LDLDIRECT 103.9 11/05/2013   TRIG 76.0 07/14/2021   CHOLHDL 2 07/14/2021  -He continues on Crestor 10 mg daily without side effects  Philemon Kingdom, MD PhD [facility] Endocrinology

## 2022-01-01 ENCOUNTER — Encounter: Payer: Self-pay | Admitting: Gastroenterology

## 2022-01-01 ENCOUNTER — Other Ambulatory Visit (INDEPENDENT_AMBULATORY_CARE_PROVIDER_SITE_OTHER): Payer: Medicare Other

## 2022-01-01 ENCOUNTER — Other Ambulatory Visit: Payer: Self-pay | Admitting: Internal Medicine

## 2022-01-01 ENCOUNTER — Encounter: Payer: Self-pay | Admitting: Internal Medicine

## 2022-01-01 DIAGNOSIS — M25662 Stiffness of left knee, not elsewhere classified: Secondary | ICD-10-CM | POA: Diagnosis not present

## 2022-01-01 DIAGNOSIS — M25562 Pain in left knee: Secondary | ICD-10-CM | POA: Diagnosis not present

## 2022-01-01 DIAGNOSIS — K7030 Alcoholic cirrhosis of liver without ascites: Secondary | ICD-10-CM | POA: Diagnosis not present

## 2022-01-01 DIAGNOSIS — E1159 Type 2 diabetes mellitus with other circulatory complications: Secondary | ICD-10-CM

## 2022-01-01 LAB — BASIC METABOLIC PANEL
BUN: 26 mg/dL — ABNORMAL HIGH (ref 6–23)
CO2: 26 mEq/L (ref 19–32)
Calcium: 9.2 mg/dL (ref 8.4–10.5)
Chloride: 103 mEq/L (ref 96–112)
Creatinine, Ser: 1.21 mg/dL (ref 0.40–1.50)
GFR: 59.7 mL/min — ABNORMAL LOW (ref >60.00)
Glucose, Bld: 198 mg/dL — ABNORMAL HIGH (ref 70–99)
Potassium: 4.5 mEq/L (ref 3.5–5.1)
Sodium: 134 mEq/L — ABNORMAL LOW (ref 135–145)

## 2022-01-04 ENCOUNTER — Other Ambulatory Visit: Payer: Self-pay

## 2022-01-04 DIAGNOSIS — K7030 Alcoholic cirrhosis of liver without ascites: Secondary | ICD-10-CM

## 2022-01-05 ENCOUNTER — Encounter: Payer: Medicare Other | Admitting: Internal Medicine

## 2022-01-07 ENCOUNTER — Other Ambulatory Visit: Payer: Self-pay

## 2022-01-07 DIAGNOSIS — K7030 Alcoholic cirrhosis of liver without ascites: Secondary | ICD-10-CM

## 2022-01-07 NOTE — Progress Notes (Signed)
Patient returned call to the office. He has reviewed ultrasound results.  He stated diuretics are working and he does not have any edema in ankles or abdomen.  He was scheduled to follow up with Dr Ardis Hughs on 02-09-22 at 10:30 am.  He was advised to have lab work (BMP) a few days prior to appointment.  Patient agreed to plan and verbalized understanding.  No further questions.

## 2022-01-11 DIAGNOSIS — M25662 Stiffness of left knee, not elsewhere classified: Secondary | ICD-10-CM | POA: Diagnosis not present

## 2022-01-11 DIAGNOSIS — M25562 Pain in left knee: Secondary | ICD-10-CM | POA: Diagnosis not present

## 2022-01-12 ENCOUNTER — Ambulatory Visit (INDEPENDENT_AMBULATORY_CARE_PROVIDER_SITE_OTHER): Payer: Medicare Other | Admitting: Internal Medicine

## 2022-01-12 ENCOUNTER — Other Ambulatory Visit: Payer: Self-pay

## 2022-01-12 ENCOUNTER — Encounter: Payer: Self-pay | Admitting: Internal Medicine

## 2022-01-12 VITALS — BP 110/72 | HR 64 | Temp 97.3°F | Ht 69.0 in | Wt 168.0 lb

## 2022-01-12 DIAGNOSIS — N401 Enlarged prostate with lower urinary tract symptoms: Secondary | ICD-10-CM

## 2022-01-12 DIAGNOSIS — Z0000 Encounter for general adult medical examination without abnormal findings: Secondary | ICD-10-CM | POA: Diagnosis not present

## 2022-01-12 DIAGNOSIS — D696 Thrombocytopenia, unspecified: Secondary | ICD-10-CM

## 2022-01-12 DIAGNOSIS — F331 Major depressive disorder, recurrent, moderate: Secondary | ICD-10-CM

## 2022-01-12 DIAGNOSIS — E119 Type 2 diabetes mellitus without complications: Secondary | ICD-10-CM | POA: Diagnosis not present

## 2022-01-12 DIAGNOSIS — F1020 Alcohol dependence, uncomplicated: Secondary | ICD-10-CM | POA: Diagnosis not present

## 2022-01-12 DIAGNOSIS — K7030 Alcoholic cirrhosis of liver without ascites: Secondary | ICD-10-CM

## 2022-01-12 DIAGNOSIS — Z794 Long term (current) use of insulin: Secondary | ICD-10-CM

## 2022-01-12 DIAGNOSIS — R351 Nocturia: Secondary | ICD-10-CM

## 2022-01-12 LAB — HM DIABETES FOOT EXAM

## 2022-01-12 MED ORDER — LANTUS SOLOSTAR 100 UNIT/ML ~~LOC~~ SOPN: 34.0000 [IU] | PEN_INJECTOR | Freq: Two times a day (BID) | SUBCUTANEOUS | 0 refills | Status: DC

## 2022-01-12 MED ORDER — TAMSULOSIN HCL 0.4 MG PO CAPS: 0.4000 mg | ORAL_CAPSULE | Freq: Every day | ORAL | 3 refills | Status: DC

## 2022-01-12 NOTE — Assessment & Plan Note (Signed)
Ongoing care with propranolol 80, spironolactone 100 daily, furosemide 40

## 2022-01-12 NOTE — Assessment & Plan Note (Signed)
Sleep problems now Ready to try medication---will start tamsulosin 0.4mg  daily

## 2022-01-12 NOTE — Assessment & Plan Note (Signed)
I have personally reviewed the Medicare Annual Wellness questionnaire and have noted 1. The patient's medical and social history 2. Their use of alcohol, tobacco or illicit drugs 3. Their current medications and supplements 4. The patient's functional ability including ADL's, fall risks, home safety risks and hearing or visual             impairment. 5. Diet and physical activities 6. Evidence for depression or mood disorders  The patients weight, height, BMI and visual acuity have been recorded in the chart I have made referrals, counseling and provided education to the patient based review of the above and I have provided the pt with a written personalized care plan for preventive services.  I have provided you with a copy of your personalized plan for preventive services. Please take the time to review along with your updated medication list.  Done with colonoscopies No PSA due to age Discussed exercise Can get shingrix at pharmacy Flu vaccine in the fall

## 2022-01-12 NOTE — Assessment & Plan Note (Signed)
Lab Results  Component Value Date   HGBA1C 5.8 (A) 11/17/2021   Now on just lantus 34 bid (should wean if persistently low sugars) and metformin 1000 bid Severe neuropathy--needs cane for walking (but not bad pain)

## 2022-01-12 NOTE — Assessment & Plan Note (Signed)
Has cut down but multiple problems Would consider naltrexone if he stops

## 2022-01-12 NOTE — Assessment & Plan Note (Signed)
Ongoing depression  Still on the paroxetine 40mg  daily

## 2022-01-12 NOTE — Assessment & Plan Note (Signed)
Last 122K Due to cirrhosis No other Rx for this

## 2022-01-12 NOTE — Progress Notes (Signed)
Subjective:    Patient ID: David [name], male    DOB: 04-Apr-1951, 71 y.o.   MRN: [id number]  HPI Here for Medicare wellness visit and follow up of chronic health conditions Reviewed advanced directives Reviewed other doctors---Dr Pool---neurosurgeon, Dr Regal---podiatrist, Dr Gherghe--endocrinologist, Dr Patel--rheumatologist, Dr Cooper--cardiology, Dr Andres Shad, Dr Rexene Alberts Had surgery in low back in July. Will need additional surgery soon. No other hospitalizations In therapy for his knee--no other exercise Ongoing alcohol use No tobacco Multiple falls with injuries Vision is okay Hearing is not great--will need evaluation Independent with instrumental ADLs Mild memory issues--nothing worrisome to him  Reviewed recent ER visit for hypoglycemic seizure Now with CGM--Freestyle LIbre Off rapid insulin now Still on lantus 35---metformin increased to 1000 bid again Has had some low sugars but no loss of consciousness Ongoing foot numbness---not much pain  Still has thick scab on extensor left hand----clean and dry Had another fall last night----hit head (slipped bending down). Ruined walker Using cane now  Has tried to cut back on alcohol Still will drink 6-8 beers or more----but only twice a week Ongoing balance problems Does take iron and MVI  Still depressed Continues on the paxil  Knee is stronger Infection cleared No need for ID anymore Ongoing hydrocodone for knee and back---infrequent  Ongoing GI follow up Cirrhosis, platelets 122K   No chest pain or SOB No palpitations Chronic edema---controlled by  diuretics  Last GFR 59  Current Outpatient Medications on File Prior to Visit  Medication Sig Dispense Refill   colchicine 0.6 MG tablet Take 1 tablet (0.6 mg total) by mouth daily. 30 tablet 0   Continuous Blood Gluc Receiver (FREESTYLE LIBRE 2 READER) DEVI 1 each by Does not apply route daily. 1 each 0   Continuous Blood Gluc Sensor (FREESTYLE  LIBRE 2 SENSOR) MISC 1 each by Does not apply route every 14 (fourteen) days. 6 each 3   ferrous sulfate 325 (65 FE) MG tablet Take 1 tablet (325 mg total) by mouth daily with breakfast. 30 tablet 0   furosemide (LASIX) 40 MG tablet Take 1 tablet (40 mg total) by mouth daily. 30 tablet 3   glucose blood (ONE TOUCH ULTRA TEST) test strip Use to test blood sugar 2 times daily as instructed. Dx: E11.59, E11.65 100 each 5   Homeopathic Products (LIVER SUPPORT SL) Place under the tongue.     HYDROcodone-acetaminophen (NORCO/VICODIN) 5-325 MG tablet Take 1 tablet by mouth every 6 (six) hours as needed.     Insulin Pen Needle 32G X 4 MM MISC Use 4x a day - with insulin 200 each 3   metFORMIN (GLUCOPHAGE) 1000 MG tablet Take 1 tablet (1,000 mg total) by mouth daily with supper. (Patient taking differently: Take 1,000 mg by mouth 2 (two) times daily with a meal.) 90 tablet 3   Multiple Vitamin (MULTIVITAMIN WITH MINERALS) TABS tablet Take 1 tablet by mouth at bedtime.     ONETOUCH DELICA LANCETS FINE MISC Use to test blood sugar 2 times daily as instructed. Dx: E11.59, E11.65 100 each 5   pantoprazole (PROTONIX) 40 MG tablet Take 1 tablet (40 mg total) by mouth 2 (two) times daily. 60 tablet 0   PARoxetine (PAXIL) 40 MG tablet Take 1 tablet (40 mg total) by mouth every morning. 30 tablet 0   propranolol (INDERAL) 80 MG tablet Take 80 mg by mouth 2 (two) times daily.     rosuvastatin (CRESTOR) 10 MG tablet Take 1 tablet (10 mg total) by mouth daily.  30 tablet 0   spironolactone (ALDACTONE) 100 MG tablet Take 1 tablet (100 mg total) by mouth daily. 30 tablet 3   triamcinolone cream (KENALOG) 0.1 % APPLY TOPICALLY TO SCALP TWO TIMES DAILY AS NEEDED 30 g 0   No current facility-administered medications on file prior to visit.    No Known Allergies  Past Medical History:  Diagnosis Date   Allergy    Anemia    Arthritis    Bilateral hands and feet   CAD (coronary artery disease)    s/p bifurcation  stenting (DES x2) and stenting bifurcation lesion distal RCA (1 DES)   Cataract    Cirrhosis (HCC)    Depression    Diabetes mellitus    Esophageal varices (HCC)    GERD (gastroesophageal reflux disease)    Hyperlipemia    Hypertension    Myocardial infarction ([place])    Sleep apnea    CPAP    Past Surgical History:  Procedure Laterality Date   BIOPSY  09/01/2018   Procedure: BIOPSY;  Surgeon: Mauri Pole, MD;  Location: [facility];  Service: Endoscopy;;   CATARACT EXTRACTION Bilateral    CORONARY ANGIOPLASTY WITH STENT PLACEMENT   March 2010   Dr. Olevia Perches   ESOPHAGOGASTRODUODENOSCOPY (EGD) WITH PROPOFOL N/A 09/01/2018   Procedure: ESOPHAGOGASTRODUODENOSCOPY (EGD) WITH PROPOFOL;  Surgeon: Mauri Pole, MD;  Location: [facility];  Service: Endoscopy;  Laterality: N/A;   KNEE ARTHROSCOPY Left 09/07/2021   Procedure: IRRIGATION AND DEBRIDEMENT OF LEFT KNEE; ARTHROSCOPY OF LEFT KNEE, PARTIAL MEDIAL Gary.;  Surgeon: [name], MD;  Location: [facility];  Service: Orthopedics;  Laterality: Left;   LEFT HEART CATH AND CORONARY ANGIOGRAPHY N/A 08/31/2018   Procedure: LEFT HEART CATH AND CORONARY ANGIOGRAPHY;  Surgeon: Sherren Mocha, MD;  Location: [facility];  Service: Cardiovascular;  Laterality: N/A;   THORACIC DISCECTOMY Right 06/19/2021   Procedure: Microdiscectomy - right - Thoracic Eleven-Thoracic Twelve;  Surgeon: Earnie Larsson, MD;  Location: [place] [facility];  Service: Neurosurgery;  Laterality: Right;   TONSILLECTOMY  1959    Family History  Problem Relation Age of Onset   Heart failure Father        Deceased 16 y/o   Diabetes Father    Cancer Father        Prostate   Heart failure Mother        Deceased 67 y/o   Alcohol abuse Mother    Schizophrenia Son        Paranoid   Uveitis Son    Cancer Brother        prostate   Colon cancer Neg Hx    Esophageal cancer Neg Hx    Stomach cancer Neg Hx    Liver cancer Neg Hx    Pancreatic cancer Neg Hx     Rectal cancer Neg Hx     Social History   Socioeconomic History   Marital status: Divorced    Spouse name: Not on file   Number of children: 2   Years of education: Not on file   Highest education level: Not on file  Occupational History   Occupation: Patent examiner buildings    Comment: site closed   Occupation: Engineering geologist    Comment: part time    Comment: Part time work  Tobacco Use   Smoking status: Former    Types: Cigarettes    Quit date: 11/30/1983    Years since quitting: 38.1    Passive exposure: Never  Smokeless tobacco: Never  Vaping Use   Vaping Use: Never used  Substance and Sexual Activity   Alcohol use: Yes    Alcohol/week: 20.0 standard drinks    Types: 20 Cans of beer per week    Comment: per pt 10-15 beers/week (as of 06/18/21)   Drug use: No   Sexual activity: Not Currently  Other Topics Concern   Not on file  Social History Narrative   No living will   Would want want son [name], as health care POA   Would accept resuscitation   Not sure about tube feeds--but doesn't want if cognitively unaware   Social Determinants of Health   Financial Resource Strain: Medium Risk   Difficulty of Paying Living Expenses: Somewhat hard  Food Insecurity: No Food Insecurity   Worried About Charity fundraiser in the Last Year: Never true   Ran Out of Food in the Last Year: Never true  Transportation Needs: Not on file  Physical Activity: Not on file  Stress: Not on file  Social Connections: Not on file  Intimate Partner Violence: Not on file   Review of Systems Appetite is okay Weight is up slightly Sleep is not great now. Goes back to years ago with divorce Some heartburn---despite protonix. No dysphagia Wears seat belt Does see dentist Bowels move okay----no blood Urine stream is slow. Nocturia x 5 or so    Objective:   Physical Exam Constitutional:      Appearance: Normal appearance.  HENT:     Mouth/Throat:     Comments: No  lesions Eyes:     Conjunctiva/sclera: Conjunctivae normal.     Pupils: Pupils are equal, round, and reactive to light.  Cardiovascular:     Rate and Rhythm: Normal rate and regular rhythm.     Heart sounds: No murmur heard.   No gallop.     Comments: Faint pedal pulses Pulmonary:     Effort: Pulmonary effort is normal.     Breath sounds: No wheezing or rales.     Comments: Slightly decreased breath sounds at right base Abdominal:     Palpations: Abdomen is soft.     Tenderness: There is no abdominal tenderness.  Musculoskeletal:     Cervical back: Neck supple.     Right lower leg: No edema.     Left lower leg: No edema.  Lymphadenopathy:     Cervical: No cervical adenopathy.  Skin:    Comments: No foot lesions  Healing lesions on arms/hands Scabbed laceration on upper occiput (from yesterday)  Neurological:     General: No focal deficit present.     Mental Status: He is alert and oriented to person, place, and time.     Comments: Recall only 1/3 Got the clock/numbers right---but not the hands  Decreased sensation in feet  Psychiatric:        Mood and Affect: Mood normal.        Behavior: Behavior normal.           Assessment & Plan:

## 2022-01-12 NOTE — Progress Notes (Signed)
Hearing Screening - Comments:: Did not pass whisper test Vision Screening - Comments:: May 2022

## 2022-01-13 DIAGNOSIS — M25662 Stiffness of left knee, not elsewhere classified: Secondary | ICD-10-CM | POA: Diagnosis not present

## 2022-01-13 DIAGNOSIS — M00062 Staphylococcal arthritis, left knee: Secondary | ICD-10-CM | POA: Diagnosis not present

## 2022-01-20 ENCOUNTER — Encounter (HOSPITAL_COMMUNITY): Payer: Self-pay | Admitting: Physician Assistant

## 2022-01-20 ENCOUNTER — Other Ambulatory Visit: Payer: Self-pay | Admitting: Neurosurgery

## 2022-01-20 DIAGNOSIS — S8002XA Contusion of left knee, initial encounter: Secondary | ICD-10-CM | POA: Diagnosis not present

## 2022-01-20 DIAGNOSIS — M25562 Pain in left knee: Secondary | ICD-10-CM | POA: Diagnosis not present

## 2022-01-20 DIAGNOSIS — M1712 Unilateral primary osteoarthritis, left knee: Secondary | ICD-10-CM | POA: Diagnosis not present

## 2022-01-20 NOTE — Progress Notes (Signed)
Surgical Instructions   Your procedure is scheduled on Monday 01/25/2022.  Report to [facility] Main Entrance "A" at 07:30 A.M., then check in with the Admitting office.  Call [phone] if you have problems or questions between now and the morning of surgery:   Remember: Do not eat or drink after midnight the night before your surgery  Take these medicines the morning of surgery with A SIP OF WATER:  Fluticasone (Flonase) nasal spray Paroxetine (Paxil) Propranolol (Inderal) Rosuvastatin (Crestor) Tamsulosin (Flomax)   If needed you may take these medications the morning of surgery: carboxymethylcellul-Glycerin (Optive) eye drops Hydrocodone-acetaminophen (Norco/vicodin)   As of today, STOP taking any Aspirin (unless otherwise instructed by your surgeon) or Aspirin-containing products; NSAIDS - Aleve, Naproxen, Ibuprofen, Motrin, Advil, Goody's, BC's, all herbal medications, fish oil, and all vitamins.  WHAT DO I DO ABOUT MY DIABETES MEDICATION?  Do not take oral diabetes medicines (pills) the morning of surgery. - HOLD Metformin the day of surgery  Insulin Glargine (Lantus Solostar)  THE MORNING BEFORE SURGERY, take 34 units of insulin.  THE NIGHT BEFORE SURGERY, take 17 units (1/2 of usual dose) of insulin.  THE MORNING OF SURGERY, take 17 units (1/2 of usual dose) of insulin.    Insulin Lispro (Humalog)  THE DAY OF SURGERY, take usual doses with meals.  No bedtime dose THE DAY OF SURGERY, NONE,  ONLY if your CBG is greater than 220 mg/dL, you may take  of your sliding scale (correction) dose of insulin.   HOW TO MANAGE YOUR DIABETES BEFORE AND AFTER SURGERY  Why is it important to control my blood sugar before and after surgery? Improving blood sugar levels before and after surgery helps healing and can limit problems. A way of improving blood sugar control is eating a healthy diet by:  Eating less sugar and carbohydrates  Increasing activity/exercise  Talking  with your doctor about reaching your blood sugar goals High blood sugars (greater than 180 mg/dL) can raise your risk of infections and slow your recovery, so you will need to focus on controlling your diabetes during the weeks before surgery. Make sure that the doctor who takes care of your diabetes knows about your planned surgery including the date and location.  How do I manage my blood sugar before surgery? Check your blood sugar at least 4 times a day, starting 2 days before surgery, to make sure that the level is not too high or low.  Check your blood sugar the morning of your surgery when you wake up and every 2 hours until you get to the Short Stay unit.  If your blood sugar is less than 70 mg/dL, you will need to treat for low blood sugar: Do not take insulin. Treat a low blood sugar (less than 70 mg/dL) with  cup of clear juice (cranberry or apple), 4 glucose tablets, OR glucose gel. Recheck blood sugar in 15 minutes after treatment (to make sure it is greater than 70 mg/dL). If your blood sugar is not greater than 70 mg/dL on recheck, call [phone] for further instructions. Report your blood sugar to the short stay nurse when you get to Short Stay.  If you are admitted to the hospital after surgery: Your blood sugar will be checked by the staff and you will probably be given insulin after surgery (instead of oral diabetes medicines) to make sure you have good blood sugar levels. The goal for blood sugar control after surgery is 80-180 mg/dL.    After  your pre-procedure COVID test  You are not required to quarantine however you are required to wear a well-fitting mask when you are out and around people not in your household.  If your mask becomes wet or soiled, replace with a new one.  Wash your hands often with soap and water for 20 seconds or clean your hands with an alcohol-based hand sanitizer that contains at least 60% alcohol.  Do not share personal items.  Notify  your provider: if you are in close contact with someone who has COVID  or if you develop a fever of 100.4 or greater, sneezing, cough, sore throat, shortness of breath or body aches.          Do not wear jewelry  Do not wear lotions, powders, colognes, or deodorant.  Do not shave 48 hours prior to surgery.  Men may shave face and neck.  Do not bring valuables to the hospital - [facility] is not responsible for any belongings or valuables.  Do NOT Smoke (Tobacco/Vaping) or drink Alcohol 24 hours prior to your procedure  If you use a CPAP at night, please bring your mask for your overnight stay.   Contacts, glasses, hearing aids, dentures or partials may not be worn into surgery, please bring cases for these belongings   For patients admitted to the hospital, discharge time will be determined by your treatment team.   Patients discharged the day of surgery will not be allowed to drive home, and someone needs to stay with them for 24 hours.  NO VISITORS WILL BE ALLOWED IN PRE-OP WHERE PATIENTS ARE PREPPED FOR SURGERY.  ONLY 1 SUPPORT PERSON MAY BE PRESENT IN THE WAITING ROOM WHILE YOU ARE IN SURGERY.  IF YOU ARE TO BE ADMITTED, ONCE YOU ARE IN YOUR ROOM YOU WILL BE ALLOWED TWO (2) VISITORS. 1 (ONE) VISITOR MAY STAY OVERNIGHT BUT MUST ARRIVE TO THE ROOM BY 8pm.  Minor children may have two parents present. Special consideration for safety and communication needs will be reviewed on a case by case basis.  Special instructions:    Oral Hygiene is also important to reduce your risk of infection.  Remember - BRUSH YOUR TEETH THE MORNING OF SURGERY WITH YOUR REGULAR TOOTHPASTE   [facility]- Preparing For Surgery  Before surgery, you can play an important role. Because skin is not sterile, your skin needs to be as free of germs as possible. You can reduce the number of germs on your skin by washing with CHG (chlorahexidine gluconate) Soap before surgery.  CHG is an antiseptic cleaner which  kills germs and bonds with the skin to continue killing germs even after washing.     Please do not use if you have an allergy to CHG or antibacterial soaps. If your skin becomes reddened/irritated stop using the CHG.  Do not shave (including legs and underarms) for at least 48 hours prior to first CHG shower. It is OK to shave your face.  Please follow these instructions carefully.     Shower the NIGHT BEFORE SURGERY and the MORNING OF SURGERY with CHG Soap.   If you chose to wash your hair, wash your hair first as usual with your normal shampoo. After you shampoo, rinse your hair and body thoroughly to remove the shampoo.    Then [employer] and genitals (private parts) with your normal soap and rinse thoroughly to remove soap.  Next use the CHG Soap as you would any other liquid soap. You can apply  CHG directly to the skin and wash gently with a clean washcloth.   Apply the CHG Soap to your body ONLY FROM THE NECK DOWN.  Do not use on open wounds or open sores. Avoid contact with your eyes, ears, mouth and genitals (private parts). Wash Face and genitals (private parts)  with your normal soap.   Wash thoroughly, paying special attention to the area where your surgery will be performed.  Thoroughly rinse your body with warm water from the neck down.  DO NOT shower/wash with your normal soap after using and rinsing off the CHG Soap.  Pat yourself dry with a CLEAN TOWEL.  Wear CLEAN PAJAMAS to bed the night before surgery  Place CLEAN SHEETS on your bed the night before your surgery  DO NOT SLEEP WITH PETS.   Day of Surgery:  Take a shower with CHG soap. Wear Clean/Comfortable clothing the morning of surgery Do not apply any deodorants/lotions.   Remember to brush your teeth WITH YOUR REGULAR TOOTHPASTE.   Please read over the fact sheets that you were given.

## 2022-01-21 ENCOUNTER — Other Ambulatory Visit: Payer: Self-pay

## 2022-01-21 ENCOUNTER — Encounter (HOSPITAL_COMMUNITY)
Admission: RE | Admit: 2022-01-21 | Discharge: 2022-01-21 | Disposition: A | Discharge status: Home / Self Care | Payer: Medicare Other | Source: Ambulatory Visit | Attending: Neurosurgery | Admitting: Neurosurgery

## 2022-01-21 ENCOUNTER — Encounter (HOSPITAL_COMMUNITY): Payer: Self-pay

## 2022-01-21 VITALS — BP 104/63 | HR 61 | Temp 97.8°F | Resp 17 | Ht 69.0 in | Wt 165.0 lb

## 2022-01-21 DIAGNOSIS — Z01812 Encounter for preprocedural laboratory examination: Secondary | ICD-10-CM | POA: Insufficient documentation

## 2022-01-21 DIAGNOSIS — Z20822 Contact with and (suspected) exposure to covid-19: Secondary | ICD-10-CM | POA: Insufficient documentation

## 2022-01-21 DIAGNOSIS — K7030 Alcoholic cirrhosis of liver without ascites: Secondary | ICD-10-CM | POA: Diagnosis not present

## 2022-01-21 DIAGNOSIS — I119 Hypertensive heart disease without heart failure: Secondary | ICD-10-CM | POA: Diagnosis not present

## 2022-01-21 DIAGNOSIS — E119 Type 2 diabetes mellitus without complications: Secondary | ICD-10-CM | POA: Insufficient documentation

## 2022-01-21 DIAGNOSIS — Z7984 Long term (current) use of oral hypoglycemic drugs: Secondary | ICD-10-CM | POA: Insufficient documentation

## 2022-01-21 DIAGNOSIS — F1020 Alcohol dependence, uncomplicated: Secondary | ICD-10-CM | POA: Diagnosis not present

## 2022-01-21 DIAGNOSIS — I2510 Atherosclerotic heart disease of native coronary artery without angina pectoris: Secondary | ICD-10-CM | POA: Diagnosis not present

## 2022-01-21 DIAGNOSIS — E785 Hyperlipidemia, unspecified: Secondary | ICD-10-CM | POA: Diagnosis not present

## 2022-01-21 DIAGNOSIS — D500 Iron deficiency anemia secondary to blood loss (chronic): Secondary | ICD-10-CM | POA: Insufficient documentation

## 2022-01-21 DIAGNOSIS — Z01818 Encounter for other preprocedural examination: Secondary | ICD-10-CM

## 2022-01-21 DIAGNOSIS — I8510 Secondary esophageal varices without bleeding: Secondary | ICD-10-CM | POA: Insufficient documentation

## 2022-01-21 DIAGNOSIS — Z794 Long term (current) use of insulin: Secondary | ICD-10-CM | POA: Diagnosis not present

## 2022-01-21 DIAGNOSIS — G4089 Other seizures: Secondary | ICD-10-CM | POA: Diagnosis not present

## 2022-01-21 LAB — COMPREHENSIVE METABOLIC PANEL
ALT: 52 U/L — ABNORMAL HIGH (ref 0–44)
AST: 63 U/L — ABNORMAL HIGH (ref 15–41)
Albumin: 3.2 g/dL — ABNORMAL LOW (ref 3.5–5.0)
Alkaline Phosphatase: 126 U/L (ref 38–126)
Anion gap: 10 (ref 5–15)
BUN: 29 mg/dL — ABNORMAL HIGH (ref 8–23)
CO2: 25 mmol/L (ref 22–32)
Calcium: 8.9 mg/dL (ref 8.9–10.3)
Chloride: 101 mmol/L (ref 98–111)
Creatinine, Ser: 1.32 mg/dL — ABNORMAL HIGH (ref 0.61–1.24)
GFR, Estimated: 57 mL/min — ABNORMAL LOW (ref >60)
Glucose, Bld: 140 mg/dL — ABNORMAL HIGH (ref 70–99)
Potassium: 5 mmol/L (ref 3.5–5.1)
Sodium: 136 mmol/L (ref 135–145)
Total Bilirubin: 0.8 mg/dL (ref 0.3–1.2)
Total Protein: 7.2 g/dL (ref 6.5–8.1)

## 2022-01-21 LAB — SURGICAL PCR SCREEN
MRSA, PCR: NEGATIVE
Staphylococcus aureus: NEGATIVE

## 2022-01-21 LAB — GLUCOSE, CAPILLARY: Glucose-Capillary: 152 mg/dL — ABNORMAL HIGH (ref 70–99)

## 2022-01-21 LAB — SARS CORONAVIRUS 2 (TAT 6-24 HRS): SARS Coronavirus 2: NEGATIVE

## 2022-01-21 NOTE — Progress Notes (Addendum)
PCP - Dr. Viviana Simpler Cardiologist - Dr. Sherren Mocha  PPM/ICD - n/a Device Orders -  Rep Notified -   Chest x-ray - 12/26/21 EKG - 12/26/21 Stress Test - 08/28/2017 ECHO - 08/28/2018 Cardiac Cath - 08/31/2018  Sleep Study - "back in the 80's" CPAP - patient denies  Fasting Blood Sugar - 100s Checks Blood Sugar frequently during the day, has continuous CBG monitor  Blood Thinner Instructions: n/a Aspirin Instructions: n/a  ERAS Protcol - NPO after midnight PRE-SURGERY Ensure or G2-  n/a  COVID TEST- at PAT appointment  Per Jeneen Rinks, repeated CMP.  CBC on 12/26/21 is within 30 days   Anesthesia review: yes, hx of CAD  Patient denies shortness of breath, fever, cough and chest pain at PAT appointment.  Patient had a fall last Saturday, per patient he lost balance.  He has multiple bruises on face and chin.  Patient also has abrasion to left hand and posterior distal forearm that he has bandaged.   All instructions explained to the patient, with a verbal understanding of the material. Patient agrees to go over the instructions while at home for a better understanding. Patient also instructed to self quarantine after being tested for COVID-19. The opportunity to ask questions was provided.

## 2022-01-22 ENCOUNTER — Telehealth: Payer: Self-pay | Admitting: Cardiovascular Disease

## 2022-01-22 LAB — HEMOGLOBIN A1C
Hgb A1c MFr Bld: 5.9 % — ABNORMAL HIGH (ref 4.8–5.6)
Mean Plasma Glucose: 123 mg/dL

## 2022-01-22 NOTE — Telephone Encounter (Signed)
I will send an FYI to requesting office the pt is going to need an appt for pre op clearance. Our office just received the clearance request today @ 2:17 pm for surgery Monday 01/25/22. Pt's surgery will need to be postponed until the pt has been cleared his cardiologist.

## 2022-01-22 NOTE — Telephone Encounter (Signed)
° °  Pre-operative Risk Assessment    Patient Name: David [name]  DOB: 1951-02-21 MRN: [id number]      Request for Surgical Clearance    Procedure:   Lumbar Laminectomy  Date of Surgery:  Clearance 01/25/22                                 Surgeon:  Dr. Earnie Larsson Surgeon's Group or Practice Name:  [facility] Phone number:  [phone] Fax number:  [phone]   Type of Clearance Requested:   - Medical  - Pharmacy:  Hold Aspirin How long do they hold Aspirin   Type of Anesthesia:  General    Additional requests/questions:      [name]   01/22/2022, 2:17 PM

## 2022-01-22 NOTE — Telephone Encounter (Signed)
Primary [place], MD  Chart reviewed as part of pre-operative protocol coverage. Because of Charon Akamine past medical history and time since last visit, he/she will require a follow-up visit in order to better assess preoperative cardiovascular risk.  Pre-op covering staff: - Please schedule appointment and call patient to inform them. - Please contact requesting surgeon's office via preferred method (i.e, phone, fax) to inform them of need for appointment prior to surgery.  If applicable, this message will also be routed to pharmacy pool and/or primary cardiologist for input on holding anticoagulant/antiplatelet agent as requested below so that this information is available at time of patient's appointment.   Deberah Pelton, NP  01/22/2022, 2:36 PM

## 2022-01-22 NOTE — Progress Notes (Signed)
Anesthesia Chart Review:  Follows with cardiology for hx of coronary artery disease with two-vessel PCI in 2010, hypertension, hyperlipidemia. 2019 he presented shortness of breath and chest discomfort.  Stress test was abnormal.  He was taken for cardiac catheterization but hemoglobin was 5.6.  Ultimately diagnosed with cirrhosis, grade 2 esophageal varices, nonbleeding gastric ulcer. Beta-blocker was changed to nadolol. Had cath 08/31/18 showing patent stents in circumflex and RCA with only mild restenosis.  He was recommended for medical therapy. Last seen by cardiology 06/04/21 for preop clearance prior to undergoing lumbar surgery. He was cleared for surgery at that time, which he did undergo 0/25/85 without complication. He was advised to f/u with cardiology in 3 months, however that did not happen.  Was admitted October 2022 with septic arthritis of the left knee. Per last PCP note 01/12/22, infection resolved, no longer seeing ID.   Recently seen in ED 12/26/21 for hypoglycemic seizure. He was taken off rapid insulin and now has Freestyle Libre CGM. Still on Lantus and metformin  Hx of alcoholic cirrhosis with secondary esophageal varices. Followed by Dr. Ardis Hughs. Last endoscopy 04/19/20 showed Several trunks of large (> 5 mm) varices were found in the lower third of the esophagus. None with signs of recent or impending bleeding. Moderate to severe changes of portal gastropathy. No gastric varices. The previously noted gastic ulcers have healed. Recommended increase propanolol.   Last seen by Dr. Ardis Hughs 12/18/21. Per note, "He continues to drink alcohol at least a sixpack every other day and sometimes more on the weekends.  He understands that this is quite detrimental to his overall health and he will continue to try to cut back or quit. Seems like he is putting on some ascites and some lower extremity edema.  I am going to start with doubling both of his diuretic medicines so he will be taking Lasix 40  mg once daily and Aldactone 100 mg once daily.   He needs a repeat set of liver staging labs including a complete metabolic profile, coags and CBC today.  He needs a basic metabolic profile in 2 weeks to see how he is responding to his diuretics doses. We will also arrange full abdominal ultrasound to screen him for hepatoma and also to try to confirm that he is indeed putting on ascites."  Dr. Ardis Hughs commented on labs 12/20/21 stating, " Labs look OK.  Current MELD-Na score is 8. Ultrasound 12/29/21 showed changes of chronic cirrhosis with mild ascites.  CBC 12/26/21 shows stable chronic anemia with Hgb 10.1, platelets mildly low at 122k.  Proep labs reviewed, DM2 well controlled with A1c 5.9. Creatinine mildly elevated 1.32, chronic mild elevations of AST and ALT.   Pt advised by cardiology he will need to be seen in followup before clearance can be given. Case to be postponed to allow for cardiology followup.  EKG 11/21/21: Sinus bradycardia. Rate 51.  Cath 08/31/18: 1. Patent stents in the left circumflex with mild restenosis 2. Patent stents in the RCA with mild in stent restenosis 3. Widely patent left main and LAD 4. Mildly elevated LVEDP   Stable coronary anatomy without high-grade coronary stenosis. Medical therapy for CAD. OK to proceed with GI evaluation as needed.   TTE 08/28/2018: - Left ventricle: The cavity size was normal. Systolic function was    normal. The estimated ejection fraction was in the range of 60%    to 65%. Wall motion was normal; there were no regional wall    motion abnormalities.  -  Aortic valve: There was no regurgitation.  - Aorta: Ascending aortic diameter: 40 mm (S).  - Ascending aorta: The ascending aorta was mildly dilated.  - Mitral valve: There was trivial regurgitation.  - Left atrium: The atrium was moderately dilated.  - Right ventricle: The cavity size was moderately dilated. Wall    thickness was normal. Systolic function was normal.  - Right  atrium: The atrium was mildly dilated.  - Atrial septum: No defect or patent foramen ovale was identified.  - Tricuspid valve: There was trivial regurgitation.  - Pulmonic valve: There was no significant regurgitation.  - Pulmonary arteries: PA peak pressure: 32 mm Hg (S).   Impressions:   - Normal to hyperdynamic LV function, no regional wall motion    abnormalities. Mildly dilated ascending aorta. Moderately dilated    RV with normal to hyperdynamic function.   Emanuelle, [facility] Short Stay Center/Anesthesiology Phone [phone] 01/22/2022 3:28 PM

## 2022-01-23 ENCOUNTER — Other Ambulatory Visit: Payer: Self-pay | Admitting: Adult Health

## 2022-01-23 DIAGNOSIS — K219 Gastro-esophageal reflux disease without esophagitis: Secondary | ICD-10-CM

## 2022-01-25 ENCOUNTER — Ambulatory Visit (HOSPITAL_COMMUNITY): Admission: RE | Admit: 2022-01-25 | Payer: Medicare Other | Source: Home / Self Care | Admitting: Neurosurgery

## 2022-01-25 SURGERY — LUMBAR LAMINECTOMY/DECOMPRESSION MICRODISCECTOMY 3 LEVELS
Anesthesia: General | Site: Back

## 2022-01-25 NOTE — Telephone Encounter (Signed)
I have left a message for the pt to call back for a pre op appt, see notes. I was going to offer the pt if I s/w him an appt with Dr. Burt Knack 01/28/22 at 3:20, hopefully this appt will still be available when the pt calls back. Surgery has been cancelled until pt has been cleared by cardiology.

## 2022-01-25 NOTE — Telephone Encounter (Signed)
Pt has appt with Laurann Montana, NP 02/02/22 @ 1:55 pm at [facility]. I will forward clearance notes to NP for upcoming appt. Will send FYI to requesting office the pt has appt 02/02/22.

## 2022-01-26 NOTE — Telephone Encounter (Signed)
Patient is not on aspirin nor any antiplatelet agent due to history of cirrhosis or bleeding. Remainder of preoperative clearance will be addressed at upcoming appointment.   Loel Dubonnet, NP

## 2022-01-28 ENCOUNTER — Other Ambulatory Visit: Payer: Self-pay

## 2022-01-28 ENCOUNTER — Ambulatory Visit: Payer: Medicare Other | Admitting: Internal Medicine

## 2022-01-28 ENCOUNTER — Encounter: Payer: Self-pay | Admitting: Internal Medicine

## 2022-01-28 VITALS — BP 120/76 | HR 62 | Ht 69.0 in | Wt 168.8 lb

## 2022-01-28 DIAGNOSIS — E1165 Type 2 diabetes mellitus with hyperglycemia: Secondary | ICD-10-CM

## 2022-01-28 DIAGNOSIS — E1159 Type 2 diabetes mellitus with other circulatory complications: Secondary | ICD-10-CM | POA: Diagnosis not present

## 2022-01-28 DIAGNOSIS — E663 Overweight: Secondary | ICD-10-CM | POA: Diagnosis not present

## 2022-01-28 MED ORDER — LANTUS SOLOSTAR 100 UNIT/ML ~~LOC~~ SOPN: 26.0000 [IU] | PEN_INJECTOR | Freq: Every day | SUBCUTANEOUS | 3 refills | Status: DC

## 2022-01-28 MED ORDER — HUMALOG JUNIOR KWIKPEN 100 UNIT/ML ~~LOC~~ SOPN: 6.0000 [IU] | PEN_INJECTOR | Freq: Three times a day (TID) | SUBCUTANEOUS | 3 refills | Status: DC

## 2022-01-28 NOTE — Progress Notes (Signed)
Patient ID: David [name], male   DOB: 1951/08/13, 71 y.o.   MRN: [id number]   This visit occurred during the SARS-CoV-2 public health emergency.  Safety protocols were in place, including screening questions prior to the visit, additional usage of staff PPE, and extensive cleaning of exam room while observing appropriate contact time as indicated for disinfecting solutions.   HPI: David [name] is a 71 y.o.-year-old male, returning for f/u for DM2, dx in ~2008, insulin-dependent since 09/2015, uncontrolled, with complications (CAD - s/p stents in 2013, PN). Last visit 1 month ago  Interim history: No increased urination, blurry vision, nausea, chest pain. He has significant leg weakness and has surgery pending after being cleared by cardiology. He had no  hypoglycemic seizures since last visit.  However, he describes that 2 weeks ago, he fell when going to the restroom at night.  Blood sugar was 40 at that time.  He tells me that he does get alarmed by his CGM all the time.  Reviewed HbA1c levels: Lab Results  Component Value Date   HGBA1C 5.9 (H) 01/21/2022   HGBA1C 5.8 (A) 11/17/2021   HGBA1C 6.0 (A) 08/21/2021  03/05/2016: HbA1c 9.1%  He is on: - Metformin 1000 mg 2x a day, with meals -  NPH 30 units in a.m. and 35 units at night >> Lantus 34 units in a.m. - Humalog 8-12 >> 16 >> 14-20 >> 16-20 >> 14-16 (he did not decrease the dose to 8-10 units as advised at last visit) >> 6 to 8 units along with a larger meal He was on Ozempic from 01/2019 to 08/2019 but had to come off due to price.   He was on Bydureon 2 mg weekly - started 01/30/2017 >> stopped 04/2017 b/c price - could not restart  Pt checks his sugars more than 4 times a day with his CGM:   Previously: - am: 108, 125-234, 256 >> 80, 122-204 >> 83-168, 212 >> 75-135, 152, 189 - 2h after b'fast: n/c >> 163 - before lunch: 85-187, 201 >> 158, 327 >> n/c  - 2h after lunch: n/c - before dinner: 113-258 >> 99-168, 185, 214 >>  80-167, 197 >> 89-64, 170 - 2h after dinner: n/c - bedtime:  126-209, 225-240 (candy), 261 >> 160-215 >> n/c  - nighttime: n/c Lowest sugar was 115 >> 108 >> 80 >> 25 (was active that day), OTW 40-50s >> 30s >> 40; it is unclear at which level he has hypoglycemia awareness. Highest sugar was 295 >> 287 >> 215 >> 184 >> 350.  Glucometer: One Touch  Pt's meals are: - Breakfast: protein shake - Lunch: n/a - Dinner: meat + veggies /potatoes or salad - Snacks: no Diet sodas, ice tea with splenda.  No regular sodas.  -+ CKD, last BUN/creatinine:  Lab Results  Component Value Date   BUN 29 (H) 01/21/2022   CREATININE 1.32 (H) 01/21/2022  On losartan.  -+ HL; last set of lipids: Lab Results  Component Value Date   CHOL 135 07/14/2021   HDL 56.40 07/14/2021   LDLCALC 64 07/14/2021   LDLDIRECT 103.9 11/05/2013   TRIG 76.0 07/14/2021   CHOLHDL 2 07/14/2021  On Crestor 10.  - last eye exam was in 03/2021: No DR reportedly.  Has previous history of cataract surgery.  -he has numbness and tingling in his feet. UTD with foot exam (12/2021).  He also has HTN, depression.  He has a history of anabolic steroid use for muscle building. He had a  cardiac cath 08/2018 for presyncope: Stable coronary anatomy without high-grade coronary stenosis.  He is on medical therapy for CAD. He was found to be anemic (Hb 8.1) >> EGD report reviewed:  - Non-bleeding grade II esophageal varices. - Non-bleeding gastric ulcers with a clean ulcer base (Forrest Class III). Biopsied. - Erythematous duodenopathy. - Normal second portion of the duodenum. In 2021: He and his wife of 40 years separated and he had a difficult time living outside his home. In 2021 he was also diagnosed with cirrhosis.  He does have a history of heavy alcohol use and he continues to use alcohol.  He has anemia and decreased platelets. He saw a chiropractor for his neuropathy.  He was using infrared light and felt that this was  helping.   ROS: + see HPI Neurological: + tremors/+ numbness/+ tingling/no dizziness  I reviewed pt's medications, allergies, PMH, social hx, family hx, and changes were documented in the history of present illness. Otherwise, unchanged from my initial visit note.   Past Medical History:  Diagnosis Date   Allergy    Anemia    Arthritis    Bilateral hands and feet   CAD (coronary artery disease)    s/p bifurcation stenting (DES x2) and stenting bifurcation lesion distal RCA (1 DES)   Cataract    Cirrhosis (HCC)    Depression    Diabetes mellitus    type 2   Esophageal varices (HCC)    GERD (gastroesophageal reflux disease)    Hyperlipemia    Hypertension    Myocardial infarction ([place])    Sleep apnea    denies wearing CPAP   Past Surgical History:  Procedure Laterality Date   BIOPSY  09/01/2018   Procedure: BIOPSY;  Surgeon: David Pole, MD;  Location: [facility];  Service: Endoscopy;;   CATARACT EXTRACTION Bilateral    CORONARY ANGIOPLASTY WITH STENT PLACEMENT   March 2010   Dr. Olevia [name]   ESOPHAGOGASTRODUODENOSCOPY (EGD) WITH PROPOFOL N/A 09/01/2018   Procedure: ESOPHAGOGASTRODUODENOSCOPY (EGD) WITH PROPOFOL;  Surgeon: David Pole, MD;  Location: [facility];  Service: Endoscopy;  Laterality: N/A;   KNEE ARTHROSCOPY Left 09/07/2021   Procedure: IRRIGATION AND DEBRIDEMENT OF LEFT KNEE; ARTHROSCOPY OF LEFT KNEE, PARTIAL MEDIAL [place].;  Surgeon: David Leys, MD;  Location: [facility];  Service: Orthopedics;  Laterality: Left;   LEFT HEART CATH AND CORONARY ANGIOGRAPHY N/A 08/31/2018   Procedure: LEFT HEART CATH AND CORONARY ANGIOGRAPHY;  Surgeon: David Mocha, MD;  Location: [facility];  Service: Cardiovascular;  Laterality: N/A;   THORACIC DISCECTOMY Right 06/19/2021   Procedure: Microdiscectomy - right - Thoracic Eleven-Thoracic Twelve;  Surgeon: David Larsson, MD;  Location: [facility];  Service: Neurosurgery;  Laterality: Right;   TONSILLECTOMY   1959   Social History   Social History   Marital Status: Married    Spouse Name: N/A   Number of Children: 2   Occupational History   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)    Social History Main Topics   Smoking status: Former Smoker    Types: Cigarettes    Quit date: 11/30/1983   Smokeless tobacco: Never Used   Alcohol Use:  beer, 3-4 times a week, 6-8 drinks at the time        Drug Use: No   Social History Narrative   No living will   Would want wife, then son [name], as health care POA   Would accept resuscitation  Not sure about tube feeds   Current Outpatient Medications on File Prior to Visit  Medication Sig Dispense Refill   carboxymethylcellul-glycerin (OPTIVE) 0.5-0.9 % ophthalmic solution Place 1 drop into both eyes daily as needed for dry eyes.     colchicine 0.6 MG tablet Take 1 tablet (0.6 mg total) by mouth daily. (Patient not taking: Reported on 01/18/2022) 30 tablet 0   Continuous Blood Gluc Receiver (FREESTYLE LIBRE 2 READER) DEVI 1 each by Does not apply route daily. 1 each 0   Continuous Blood Gluc Sensor (FREESTYLE LIBRE 2 SENSOR) MISC 1 each by Does not apply route every 14 (fourteen) days. 6 each 3   ferrous sulfate 325 (65 FE) MG tablet Take 1 tablet (325 mg total) by mouth daily with breakfast. 30 tablet 0   fluticasone (FLONASE) 50 MCG/ACT nasal spray Place 1 spray into both nostrils daily.     furosemide (LASIX) 40 MG tablet Take 1 tablet (40 mg total) by mouth daily. 30 tablet 3   glucose blood (ONE TOUCH ULTRA TEST) test strip Use to test blood sugar 2 times daily as instructed. Dx: E11.59, E11.65 100 each 5   Homeopathic Products (LIVER SUPPORT SL) Place 1 capsule under the tongue 2 (two) times daily.     HYDROcodone-acetaminophen (NORCO/VICODIN) 5-325 MG tablet Take 1 tablet by mouth every 6 (six) hours as needed for moderate pain or severe pain.     insulin glargine (LANTUS SOLOSTAR) 100 UNIT/ML Solostar Pen Inject 34  Units into the skin 2 (two) times daily. 1 mL 0   Insulin lispro (HUMALOG JUNIOR KWIKPEN) 100 UNIT/ML Inject 6-8 Units into the skin See admin instructions. Only with a big meal     Insulin Pen Needle 32G X 4 MM MISC Use 4x a day - with insulin 200 each 3   metFORMIN (GLUCOPHAGE) 1000 MG tablet Take 1 tablet (1,000 mg total) by mouth daily with supper. (Patient taking differently: Take 1,000 mg by mouth 2 (two) times daily with a meal.) 90 tablet 3   Multiple Vitamin (MULTIVITAMIN WITH MINERALS) TABS tablet Take 1 tablet by mouth at bedtime. One a day     ONETOUCH DELICA LANCETS FINE MISC Use to test blood sugar 2 times daily as instructed. Dx: E11.59, E11.65 100 each 5   pantoprazole (PROTONIX) 40 MG tablet Take 1 tablet (40 mg total) by mouth 2 (two) times daily. (Patient not taking: Reported on 01/18/2022) 60 tablet 0   PARoxetine (PAXIL) 40 MG tablet Take 1 tablet (40 mg total) by mouth every morning. 30 tablet 0   propranolol (INDERAL) 80 MG tablet Take 80 mg by mouth 2 (two) times daily.     rosuvastatin (CRESTOR) 10 MG tablet Take 1 tablet (10 mg total) by mouth daily. 30 tablet 0   spironolactone (ALDACTONE) 100 MG tablet Take 1 tablet (100 mg total) by mouth daily. 30 tablet 3   tamsulosin (FLOMAX) 0.4 MG CAPS capsule Take 1 capsule (0.4 mg total) by mouth daily. 90 capsule 3   triamcinolone cream (KENALOG) 0.1 % APPLY TOPICALLY TO SCALP TWO TIMES DAILY AS NEEDED (Patient taking differently: 1 application daily as needed (Psoriasis).) 30 g 0   No current facility-administered medications on file prior to visit.   No Known Allergies  Family History  Problem Relation Age of Onset   Heart failure Father        Deceased 67 y/o   Diabetes Father    Cancer Father  Prostate   Heart failure Mother        Deceased 58 y/o   Alcohol abuse Mother    Schizophrenia Son        Paranoid   Uveitis Son    Cancer Brother        prostate   Colon cancer Neg Hx    Esophageal cancer Neg Hx     Stomach cancer Neg Hx    Liver cancer Neg Hx    Pancreatic cancer Neg Hx    Rectal cancer Neg Hx    PE: BP 120/76 (BP Location: Right Arm, Patient Position: Sitting, Cuff Size: Normal)    Pulse 62    Ht 5\' 9"  (1.753 m)    Wt 168 lb 12.8 oz (76.6 kg)    SpO2 99%    BMI 24.93 kg/m   Wt Readings from Last 3 Encounters:  01/28/22 168 lb 12.8 oz (76.6 kg)  01/21/22 165 lb (74.8 kg)  01/12/22 168 lb (76.2 kg)   Constitutional: overweight, in NAD, walks with a walker Eyes: PERRLA, EOMI, no exophthalmos ENT: moist mucous membranes, no thyromegaly, no cervical lymphadenopathy Cardiovascular: RRR, No MRG, no LE edema Respiratory: CTA B Musculoskeletal: no deformities, strength intact in all 4 Skin: moist, warm, + extensive abrasions on left forearm Neurological: + tremor with outstretched hands, DTR normal in all 4  ASSESSMENT: 1. DM2, insulin-dependent, uncontrolled, with complications - CAD - s/p 4 stents 2013 - seeing Dr Burt Knack - PN - Hypoglycemic seizures  Component     Latest Ref Rng 06/09/2016  C-Peptide     0.80-3.85 ng/mL 1.78  Glucose, Fasting     65 - 99 mg/dL 184 (H)  Glutamic Acid Decarb Ab     <5 IU/mL <5  Pancreatic Islet Cell Antibody     < 5 JDF Units <5   2. HL  PLAN:  1. Patient with longstanding, uncontrolled, type 2 diabetes, with increased variability in blood sugars suggestive of insulin deficiency, on metformin, and basal/bolus insulin regimen.  At last visit, we tried to start Ozempic, but this was too expensive.  His diabetes control is impaired by his continuing to drink alcohol.  He had severe hypoglycemic episodes before last visit: 23 on regular labs (07/2021), and 25 (10/2021).  He also had hypoglycemic seizures with a blood sugar in the 30s (11/21/2021) and 38 (drank alcohol and only ate a salad after taking the full dose of insulin, 12/26/2021).  I strongly advised him to try to stop alcohol, but we also discussed that NPH could predispose him to lows  so I suggested to change to  an analog insulin.  I gave him 2 samples of Lantus and discussed about how to take it.  I also recommended again to try Ozempic and to stop Humalog in case he could start this.  I advised him that if he could not get Ozempic to only use Humalog 6 to 8 units before a larger meal. -Since last visit, he was able to start the freestyle libre CGM. -Since last visit, he had another HbA1c and this was still at goal, at 5.9%. CGM interpretation: -At today's visit, we reviewed his CGM downloads: It appears that 59% of values are in target range (goal >70%), while 28% are higher than 180 (goal <25%), and 13% are lower than 70 (goal <4%).  The calculated average blood sugar is 152.  The projected HbA1c for the next 3 months (GMI) is 5.9%. -Reviewing the CGM trends, it appears that  his sugars are dropping significantly overnight, they are in the lower half of the target range between 6 AM and 1 PM, and then increase significantly peaking around 10 PM.  Upon questioning, he is taking Humalog before or after the meals and we discussed that he absolutely needs to take this approximately 15 minutes before each meal.  However, if he forgets, I advised him to only take 50% of the Humalog dose after dinner to prevent a drastic decrease overnight.  I also advised him to decrease Lantus from 34 units to 26 units to avoid further drop in blood sugars overnight and in the first half of the day.  He did have a low blood sugar at 40 since last visit and he fell in his bathroom at night.  He is thinking about starting down the CGM alarms, which are bothering him.  I advised him not to do so.  We discussed about different strategies to raise his blood sugars if they are low and to lower blood sugars if they are high and I advised him to continue to take Humalog before his meals but we will not increase the dose despite the significant increase in blood sugars with each meal to avoid further lows. - I  suggested to:  Patient Instructions  Please change: - Metformin 1000 mg 2x a day, with meals  Please decrease: - Lantus 26 units in am   Use: - Humalog 6-8 units 15 min before a meal. If you forget to take Humalog before dinner, take 4-5 units only aftre dinner   Please return in 1.5 months.  - advised to check sugars at different times of the day - 4x a day, rotating check times - advised for yearly eye exams >> he is UTD - UTD with foot exam - return to clinic in 1.5 months     2. HL -Reviewed latest lipid panel from 06/2021: LDL above our goal of 55 due to cardiovascular disease, the rest of fractions at goal: Lab Results  Component Value Date   CHOL 135 07/14/2021   HDL 56.40 07/14/2021   LDLCALC 64 07/14/2021   LDLDIRECT 103.9 11/05/2013   TRIG 76.0 07/14/2021   CHOLHDL 2 07/14/2021  -He continues on Crestor 10 mg daily without side effects  Philemon Kingdom, MD PhD [facility] Endocrinology

## 2022-01-28 NOTE — Patient Instructions (Addendum)
Please change: ?- Metformin 1000 mg 2x a day, with meals ? ?Please decrease: ?- Lantus 26 units in am  ? ?Use: ?- Humalog 6-8 units 15 min before a meal. If you forget to take Humalog before dinner, take 4-5 units only after dinner  ? ?Please return in 1.5 months. ?

## 2022-02-02 ENCOUNTER — Ambulatory Visit (HOSPITAL_BASED_OUTPATIENT_CLINIC_OR_DEPARTMENT_OTHER): Payer: Medicare Other | Admitting: Family

## 2022-02-02 ENCOUNTER — Other Ambulatory Visit: Payer: Self-pay

## 2022-02-02 ENCOUNTER — Encounter (HOSPITAL_BASED_OUTPATIENT_CLINIC_OR_DEPARTMENT_OTHER): Payer: Self-pay | Admitting: Family

## 2022-02-02 VITALS — BP 118/66 | HR 78 | Ht 69.0 in | Wt 168.9 lb

## 2022-02-02 DIAGNOSIS — Z01810 Encounter for preprocedural cardiovascular examination: Secondary | ICD-10-CM | POA: Diagnosis not present

## 2022-02-02 DIAGNOSIS — I25118 Atherosclerotic heart disease of native coronary artery with other forms of angina pectoris: Secondary | ICD-10-CM | POA: Diagnosis not present

## 2022-02-02 DIAGNOSIS — E785 Hyperlipidemia, unspecified: Secondary | ICD-10-CM

## 2022-02-02 NOTE — Progress Notes (Signed)
Office Visit    Patient Name: David [name] Date of Encounter: 02/02/2022  PCP:  [name], MD   [facility]  Cardiologist:  Sherren Mocha, MD  Advanced Practice Provider:  No care team member to display Electrophysiologist:  None    Chief Complaint    David [name] is a 71 y.o. male with a hx of coronary artery disease with two-vessel PCI in 2010, hypertension, diabetes, hyperlipidemia presents today for cardiac clearance    Past Medical History    Past Medical History:  Diagnosis Date   Allergy    Anemia    Arthritis    Bilateral hands and feet   CAD (coronary artery disease)    s/p bifurcation stenting (DES x2) and stenting bifurcation lesion distal RCA (1 DES)   Cataract    Cirrhosis ([place])    Depression    Diabetes mellitus    type 2   Esophageal varices (HCC)    GERD (gastroesophageal reflux disease)    Hyperlipemia    Hypertension    Myocardial infarction [facility])    Sleep apnea    denies wearing CPAP   Past Surgical History:  Procedure Laterality Date   BIOPSY  09/01/2018   Procedure: BIOPSY;  Surgeon: Mauri Pole, MD;  [facility];  Service: Endoscopy;;   CATARACT EXTRACTION Bilateral    CORONARY ANGIOPLASTY WITH STENT PLACEMENT   March 2010   Dr. Olevia Perches   ESOPHAGOGASTRODUODENOSCOPY (EGD) WITH PROPOFOL N/A 09/01/2018   Procedure: ESOPHAGOGASTRODUODENOSCOPY (EGD) WITH PROPOFOL;  Surgeon: Mauri Pole, MD;  [facility]: [facility];  Service: Endoscopy;  Laterality: N/A;   KNEE ARTHROSCOPY Left 09/07/2021   Procedure: IRRIGATION AND DEBRIDEMENT OF LEFT KNEE; ARTHROSCOPY OF LEFT KNEE, PARTIAL MEDIAL [place].;  Surgeon: [name], MD;  Location: [facility] ORS;  Service: Orthopedics;  Laterality: Left;   LEFT HEART CATH AND CORONARY ANGIOGRAPHY N/A 08/31/2018   Procedure: LEFT HEART CATH AND CORONARY ANGIOGRAPHY;  Surgeon: Sherren Mocha, MD;  Location: [place] CV [facility];  Service: Cardiovascular;   Laterality: N/A;   THORACIC DISCECTOMY Right 06/19/2021   Procedure: Microdiscectomy - right - Thoracic Eleven-Thoracic Twelve;  Surgeon: Earnie Larsson, MD;  Location: [place];  Service: Neurosurgery;  Laterality: Right;   TONSILLECTOMY  1959    Allergies  No Known Allergies   History of Present Illness    David [name] is a 71 y.o. male with a hx of coronary artery disease with two-vessel PCI in 2010, hypertension, diabetes, hyperlipidemia  last seen 06/04/21  In 2019 he presented shortness of breath and chest discomfort.  Stress test was abnormal.  He was taken for cardiac catheterization but hemoglobin was 5.6.  Ultimately diagnosed with cirrhosis, grade 2 esophageal varices, nonbleeding gastric ulcer.  Beta-blocker was changed to nadolol.  He underwent Cardiac catheterization showing patent stents in circumflex and RCA with only mild restenosis.  He was recommended for medical therapy.  He is followed routinely in the office.  Most recently seen 05/2021 for preoperative clearance for back procedure doing well from cardiac perspective.   Presents today for follow up. Upcoming back surgery. Has had knee surgery since last seen but not as much improvement in mobility as he hoped. Reports no shortness of breath nor dyspnea on exertion. Reports no chest pain, pressure, or tightness. No edema, orthopnea, PND. Reports no palpitations.  Still active around his home though predominantly limited by knee pain. Does note frequent falls, most recently related to hypoglycemic seizure.  EKGs/Labs/Other Studies Reviewed:   The following studies were reviewed today:   EKG:  EKG is ordered today.  The ekg ordered today demonstrates 78 bpm with no acute ST/T wave changes.   Recent Labs: 12/26/2021: Hemoglobin 10.1; Magnesium 2.2; Platelets 122; TSH 4.496 01/21/2022: ALT 52; BUN 29; Creatinine, Ser 1.32; Potassium 5.0; Sodium 136  Recent Lipid Panel    Component Value Date/Time   CHOL 135 07/14/2021 1140    TRIG 76.0 07/14/2021 1140   HDL 56.40 07/14/2021 1140   CHOLHDL 2 07/14/2021 1140   VLDL 15.2 07/14/2021 1140   LDLCALC 64 07/14/2021 1140   LDLDIRECT 103.9 11/05/2013 1249    Home Medications   Current Meds  Medication Sig   carboxymethylcellul-glycerin (OPTIVE) 0.5-0.9 % ophthalmic solution Place 1 drop into both eyes daily as needed for dry eyes.   colchicine 0.6 MG tablet Take 1 tablet (0.6 mg total) by mouth daily.   Continuous Blood Gluc Receiver (FREESTYLE LIBRE 2 READER) DEVI 1 each by Does not apply route daily.   Continuous Blood Gluc Sensor (FREESTYLE LIBRE 2 SENSOR) MISC 1 each by Does not apply route every 14 (fourteen) days.   ferrous sulfate 325 (65 FE) MG tablet Take 1 tablet (325 mg total) by mouth daily with breakfast.   fluticasone (FLONASE) 50 MCG/ACT nasal spray Place 1 spray into both nostrils daily.   furosemide (LASIX) 40 MG tablet Take 1 tablet (40 mg total) by mouth daily.   glucose blood (ONE TOUCH ULTRA TEST) test strip Use to test blood sugar 2 times daily as instructed. Dx: E11.59, E11.65   Homeopathic Products (LIVER SUPPORT SL) Place 1 capsule under the tongue 2 (two) times daily.   HYDROcodone-acetaminophen (NORCO/VICODIN) 5-325 MG tablet Take 1 tablet by mouth every 6 (six) hours as needed for moderate pain or severe pain.   insulin glargine (LANTUS SOLOSTAR) 100 UNIT/ML Solostar Pen Inject 26 Units into the skin daily.   Insulin lispro (HUMALOG JUNIOR KWIKPEN) 100 UNIT/ML Inject 6-8 Units into the skin 3 (three) times daily before meals. Only with a big meal   Insulin Pen Needle 32G X 4 MM MISC Use 4x a day - with insulin   metFORMIN (GLUCOPHAGE) 1000 MG tablet Take 1 tablet (1,000 mg total) by mouth daily with supper. (Patient taking differently: Take 1,000 mg by mouth 2 (two) times daily with a meal.)   Multiple Vitamin (MULTIVITAMIN WITH MINERALS) TABS tablet Take 1 tablet by mouth at bedtime. One a day   ONETOUCH DELICA LANCETS FINE MISC Use to  test blood sugar 2 times daily as instructed. Dx: E11.59, E11.65   pantoprazole (PROTONIX) 40 MG tablet Take 1 tablet (40 mg total) by mouth 2 (two) times daily.   PARoxetine (PAXIL) 40 MG tablet Take 1 tablet (40 mg total) by mouth every morning.   propranolol (INDERAL) 80 MG tablet Take 80 mg by mouth 2 (two) times daily.   rosuvastatin (CRESTOR) 10 MG tablet Take 1 tablet (10 mg total) by mouth daily.   spironolactone (ALDACTONE) 100 MG tablet Take 1 tablet (100 mg total) by mouth daily.   tamsulosin (FLOMAX) 0.4 MG CAPS capsule Take 1 capsule (0.4 mg total) by mouth daily.   triamcinolone cream (KENALOG) 0.1 % APPLY TOPICALLY TO SCALP TWO TIMES DAILY AS NEEDED (Patient taking differently: 1 application. daily as needed (Psoriasis).)     Review of Systems   All other systems reviewed and are otherwise negative except as noted above.  Physical Exam  VS:  BP 118/66    Pulse 78    Ht '5\' 9"'$  (1.753 m)    Wt 168 lb 14.4 oz (76.6 kg)    SpO2 98%    BMI 24.94 kg/m  , BMI Body mass index is 24.94 kg/m.  Wt Readings from Last 3 Encounters:  02/02/22 168 lb 14.4 oz (76.6 kg)  01/28/22 168 lb 12.8 oz (76.6 kg)  01/21/22 165 lb (74.8 kg)   GEN: Well nourished, well developed, in no acute distress. HEENT: normal. Neck: Supple, no JVD, carotid bruits, or masses. Cardiac: RRR, no murmurs, rubs, or gallops. No clubbing, cyanosis, edema.  Radials/PT 2+ and equal bilaterally.  Respiratory:  Respirations regular and unlabored, clear to auscultation bilaterally. GI: Soft, nontender, nondistended. MS: No deformity or atrophy. Skin: Warm and dry, no rash. Neuro:  Strength and sensation are intact. Psych: Normal affect.  Assessment & Plan    Preoperative cardiovascular examination -upcoming back surgery.  Despite significant back and knee  pain has good exercise tolerance.  Reports no anginal symptoms.  EKG today is without acute ST/T wave changes. According to the Revised Cardiac Risk Index  (RCRI), his Perioperative Risk of Major Cardiac Event is (%): 11. His Functional Capacity in METs is: 4.4 according to the Duke Activity Status Index (DASI).  He is deemed acceptable risk for the planned procedure without additional cardiovascular testing.  Will route to surgeon's office via epic fax function.  CAD - Stable with no anginal symptoms. No indication for ischemic evaluation.  GDMT includes rosuvastatin, propanolol.  No aspirin due to history of cirrhosis, bleeding. Heart healthy diet and regular cardiovascular exercise encouraged.    HTN  - Continue rosuvastatin 10 mg daily.  Cirrhosis - Volume management per GI. Still with some etoh use. Occasional orthostasis symptoms. Encouraged to make position changes slowly, wear compression stockings. If persistent may need to discuss reduction of diuretic dose with GI.   Disposition: Follow up in 6 months with Dr. Burt Knack  Signed, Loel Dubonnet, NP 02/02/2022, 9:49 PM [place]

## 2022-02-02 NOTE — Patient Instructions (Addendum)
Medication Instructions:  ?Your Physician recommend you continue on your current medication as directed.   ? ?*If you need a refill on your cardiac medications before your next appointment, please call your pharmacy* ? ?Follow-Up: ?At [facility], you and your health needs are our priority.  As part of our continuing mission to provide you with exceptional heart care, we have created designated Provider Care Teams.  These Care Teams include your primary Cardiologist (physician) and Advanced Practice Providers (APPs -  Physician Assistants and Nurse Practitioners) who all work together to provide you with the care you need, when you need it. ? ?We recommend signing up for the patient portal called "MyChart".  Sign up information is provided on this After Visit Summary.  MyChart is used to connect with patients for Virtual Visits (Telemedicine).  Patients are able to view lab/test results, encounter notes, upcoming appointments, etc.  Non-urgent messages can be sent to your provider as well.   ?To learn more about what you can do with MyChart, go to [email or web address].   ? ?Your next appointment:   ?Follow up as scheduled!  ? ?Other Instructions ?To prevent or reduce lower extremity swelling: ?Eat a low salt diet. Salt makes the body hold onto extra fluid which causes swelling. ?Sit with legs elevated. For example, in the recliner or on an [place].  ?Wear knee-high compression stockings during the daytime. Ones labeled 15-20 mmHg provide good compression. ? ?

## 2022-02-04 ENCOUNTER — Telehealth: Payer: Self-pay | Admitting: Gastroenterology

## 2022-02-04 NOTE — Telephone Encounter (Signed)
Patient called and stated that he has an OV on 3/14 and cant remember if he needed to come in for lab work. Seeking advice if he needs to do so. Please advise.  ?

## 2022-02-04 NOTE — Telephone Encounter (Signed)
Left message on voicemail informing patient that he will need lab work prior to appointment scheduled for 02-09-22 with Dr Ardis Hughs.  Advised that patient return call with any further questions or concerns. ?

## 2022-02-05 ENCOUNTER — Other Ambulatory Visit (INDEPENDENT_AMBULATORY_CARE_PROVIDER_SITE_OTHER): Payer: Medicare Other

## 2022-02-05 DIAGNOSIS — K7030 Alcoholic cirrhosis of liver without ascites: Secondary | ICD-10-CM

## 2022-02-05 LAB — BASIC METABOLIC PANEL
BUN: 22 mg/dL (ref 6–23)
CO2: 27 mEq/L (ref 19–32)
Calcium: 9.4 mg/dL (ref 8.4–10.5)
Chloride: 103 mEq/L (ref 96–112)
Creatinine, Ser: 0.99 mg/dL (ref 0.40–1.50)
GFR: 75.91 mL/min (ref >60.00)
Glucose, Bld: 152 mg/dL — ABNORMAL HIGH (ref 70–99)
Potassium: 4.6 mEq/L (ref 3.5–5.1)
Sodium: 136 mEq/L (ref 135–145)

## 2022-02-09 ENCOUNTER — Encounter: Payer: Self-pay | Admitting: Gastroenterology

## 2022-02-09 ENCOUNTER — Ambulatory Visit: Payer: Medicare Other | Admitting: Gastroenterology

## 2022-02-09 VITALS — BP 120/68 | HR 86 | Ht 69.0 in | Wt 166.0 lb

## 2022-02-09 DIAGNOSIS — K219 Gastro-esophageal reflux disease without esophagitis: Secondary | ICD-10-CM

## 2022-02-09 DIAGNOSIS — K7030 Alcoholic cirrhosis of liver without ascites: Secondary | ICD-10-CM

## 2022-02-09 MED ORDER — PANTOPRAZOLE SODIUM 40 MG PO TBEC: 40.0000 mg | DELAYED_RELEASE_TABLET | Freq: Every day | ORAL | 11 refills | Status: AC

## 2022-02-09 NOTE — Progress Notes (Signed)
Review of pertinent gastrointestinal problems: ?1. Cirrhosis, etoh related, diagnosed 2019.  Long history of alcohol abuse: beer, bourbon. Workup 2019 labs ANA negative, normal iron studies, Hep C Ab negative, Hep B Surface Ab negative, Hep B Surface Ag negative, Hep A total Ab negative.  ?Imaging: [place] October 2019 showed nodule lower liver.  No focal lesions.  Ultrasound January 2021 no focal liver lesion.  + Mild to moderate ascites. Ultrasound 10/2020 cirrhosis without mass lesions.  [place] 11/2021 showed cirrhosis, mild ascites, no focal lesions in his liver ?EGD 08/2018: Grade II distal esophagus varices, several small distal gastric ulcers (path H. Pylori negative). Started on nadolol 45m daily, changed to propranolol 20 BID at some point.  EGD May 2021 showed several trunks of large lower esophageal varices.  Also moderate severe changes of portal gastropathy, no gastric varices.  His propranolol was increased to 40 twice daily. 05/2020 increase propranolol to 60 mg twice daily.  February 2022 increased propranolol to 80 mg twice daily ?Still drinking March 2023, beer on the weekends ?Meld score 8 (11/2021 labs) ?2.  History of adenomatous colon polyps.  Colonoscopy 2012 2 subcentimeter adenomas removed.  Colonoscopy 12/2017 normal examination.  Recall recommended 10 years ? ?HPI: ?This is a very pleasant 71year old man ? ? ?I last saw him here in the office about 2 months ago, 11/2021.  It seems like he was having difficulty with lower extremity edema and some mild ascites.  I started him on Lasix 40 mg once daily, Aldactone 100 mg once daily we repeated a set of blood work and he had an ultrasound. ? ?Be met last week was normal, creatinine 0.99, sodium 136, potassium 4.6. ? ?His weight 2 months ago was 176 pounds.  Today it is 166 pounds.  Down 10 pounds. ? ?He can tell that his weight is down 10 pounds.  He has clearly lost some of the swelling in his legs and in his abdomen. ? ?He is having another low back  surgery next week. ? ?He is still drinking beer, he is limiting this to only on the weekends.  He does have concerns about his intake when it gets warmer in the summer. ? ? ?ROS: complete GI ROS as described in HPI, all other review negative. ? ?Constitutional:  No unintentional weight loss ? ? ?Past Medical History:  ?Diagnosis Date  ? Allergy   ? Anemia   ? Arthritis   ? Bilateral hands and feet  ? CAD (coronary artery disease)   ? s/p bifurcation stenting (DES x2) and stenting bifurcation lesion distal RCA (1 DES)  ? Cataract   ? [place]   ? Depression   ? Diabetes mellitus   ? type 2  ? Esophageal varices (HCC)   ? GERD (gastroesophageal reflux disease)   ? Hyperlipemia   ? Hypertension   ? Myocardial infarction ([facility]   ? Sleep apnea   ? denies wearing CPAP  ? ? ?Past Surgical History:  ?Procedure Laterality Date  ? BIOPSY  09/01/2018  ? Procedure: BIOPSY;  Surgeon: NMauri Pole MD;  [facility];  Service: Endoscopy;;  ? CATARACT EXTRACTION Bilateral   ? CORONARY ANGIOPLASTY WITH STENT PLACEMENT   March 2010  ? Dr. BOlevia Perches ? ESOPHAGOGASTRODUODENOSCOPY (EGD) WITH PROPOFOL N/A 09/01/2018  ? Procedure: ESOPHAGOGASTRODUODENOSCOPY (EGD) WITH PROPOFOL;  Surgeon: NMauri Pole MD;  [facility];  Service: Endoscopy;  Laterality: N/A;  ? KNEE ARTHROSCOPY Left 09/07/2021  ? Procedure: IRRIGATION AND DEBRIDEMENT OF  LEFT KNEE; ARTHROSCOPY OF LEFT KNEE, PARTIAL MEDIAL MINISECTOMY.;  Surgeon: [name], MD;  Location: [facility] ORS;  Service: Orthopedics;  Laterality: Left;  ? LEFT HEART CATH AND CORONARY ANGIOGRAPHY N/A 08/31/2018  ? Procedure: LEFT HEART CATH AND CORONARY ANGIOGRAPHY;  Surgeon: Sherren Mocha, MD;  Location: [place] [facility];  Service: Cardiovascular;  Laterality: N/A;  ? THORACIC DISCECTOMY Right 06/19/2021  ? Procedure: Microdiscectomy - right - Thoracic Eleven-Thoracic Twelve;  Surgeon: Earnie Larsson, MD;  [facility]: [facility];  Service: Neurosurgery;  Laterality:  Right;  ? TONSILLECTOMY  1959  ? ? ?Current Outpatient Medications  ?Medication Instructions  ? carboxymethylcellul-glycerin (OPTIVE) 0.5-0.9 % ophthalmic solution 1 drop, Both Eyes, Daily PRN  ? colchicine 0.6 mg, Oral, Daily  ? Continuous Blood Gluc Receiver (FREESTYLE LIBRE 2 READER) DEVI 1 each, Does not apply, Daily  ? Continuous Blood Gluc Sensor (FREESTYLE LIBRE 2 SENSOR) MISC 1 each, Does not apply, Every 14 days  ? ferrous sulfate 325 mg, Oral, Daily with breakfast  ? fluticasone (FLONASE) 50 MCG/ACT nasal spray 1 spray, Each Nare, Daily  ? furosemide (LASIX) 40 mg, Oral, Daily  ? glucose blood (ONE TOUCH ULTRA TEST) test strip Use to test blood sugar 2 times daily as instructed. Dx: E11.59, E11.65  ? Homeopathic Products (LIVER SUPPORT SL) 1 capsule, Sublingual, 2 times daily  ? HumaLOG Junior KwikPen 6-8 Units, Subcutaneous, 3 times daily before meals, Only with a big meal  ? HYDROcodone-acetaminophen (NORCO/VICODIN) 5-325 MG tablet 1 tablet, Oral, Every 6 hours PRN  ? Insulin Pen Needle 32G X 4 MM MISC Use 4x a day - with insulin  ? Lantus SoloStar 26 Units, Subcutaneous, Daily  ? metFORMIN (GLUCOPHAGE) 1,000 mg, Oral, Daily with supper  ? Multiple Vitamin (MULTIVITAMIN WITH MINERALS) TABS tablet 1 tablet, Oral, Daily at bedtime, One a day  ? ONETOUCH DELICA LANCETS FINE MISC Use to test blood sugar 2 times daily as instructed. Dx: E11.59, E11.65  ? pantoprazole (PROTONIX) 40 mg, Oral, 2 times daily  ? PARoxetine (PAXIL) 40 mg, Oral, BH-each morning  ? propranolol (INDERAL) 80 mg, Oral, 2 times daily  ? rosuvastatin (CRESTOR) 10 mg, Oral, Daily  ? spironolactone (ALDACTONE) 100 mg, Oral, Daily  ? tamsulosin (FLOMAX) 0.4 mg, Oral, Daily  ? triamcinolone cream (KENALOG) 0.1 % APPLY TOPICALLY TO SCALP TWO TIMES DAILY AS NEEDED  ? ? ?Allergies as of 02/09/2022  ? (No Known Allergies)  ? ? ?Family History  ?Problem Relation Age of Onset  ? Heart failure Father   ?     Deceased 69 y/o  ? Diabetes Father   ?  Cancer Father   ?     Prostate  ? Heart failure Mother   ?     Deceased 73 y/o  ? Alcohol abuse Mother   ? Schizophrenia Son   ?     Paranoid  ? Uveitis Son   ? Cancer Brother   ?     prostate  ? Colon cancer Neg Hx   ? Esophageal cancer Neg Hx   ? Stomach cancer Neg Hx   ? Liver cancer Neg Hx   ? Pancreatic cancer Neg Hx   ? Rectal cancer Neg Hx   ? ? ?Social History  ? ?Socioeconomic History  ? Marital status: Divorced  ?  Spouse name: Not on file  ? Number of children: 2  ? Years of education: Not on file  ? Highest education level: Not on file  ?Occupational History  ?  Occupation: Patent examiner buildings  ?  Comment: site closed  ? Occupation: Engineering geologist  ?  Comment: part time  ?  Comment: Part time work  ?Tobacco Use  ? Smoking status: Former  ?  Types: Cigarettes  ?  Quit date: 11/30/1983  ?  Years since quitting: 38.2  ?  Passive exposure: Never  ? Smokeless tobacco: Never  ?Vaping Use  ? Vaping Use: Never used  ?Substance and Sexual Activity  ? Alcohol use: Yes  ?  Alcohol/week: 24.0 standard drinks  ?  Types: 24 Cans of beer per week  ?  Comment: per pt 10-15 beers/week (as of 06/18/21)  ? Drug use: No  ? Sexual activity: Not Currently  ?Other Topics Concern  ? Not on file  ?Social History Narrative  ? No living will  ? Would want want son [name], as health care POA  ? Would accept resuscitation  ? Not sure about tube feeds--but doesn't want if cognitively unaware  ? ?Social Determinants of Health  ? ?Financial Resource Strain: Medium Risk  ? Difficulty of Paying Living Expenses: Somewhat hard  ?Food Insecurity: No Food Insecurity  ? Worried About Charity fundraiser in the Last Year: Never true  ? Ran Out of Food in the Last Year: Never true  ?Transportation Needs: Not on file  ?Physical Activity: Not on file  ?Stress: Not on file  ?Social Connections: Not on file  ?Intimate Partner Violence: Not on file  ? ? ? ?Physical Exam: ?BP 120/68 (BP Location: Right Arm, Patient Position: Sitting, Cuff  Size: Normal)   Pulse 86   Ht 5' 9" (1.753 m)   Wt 166 lb (75.3 kg)   SpO2 99%   BMI 24.51 kg/m?  ?Constitutional: generally well-appearing ?Psychiatric: alert and oriented x3 ?Abdomen: soft, nontender, nondistend

## 2022-02-09 NOTE — Patient Instructions (Signed)
If you are age 73 or older, your body mass index should be between 23-30. Your Body mass index is 24.51 kg/m?[name] If this is out of the aforementioned range listed, please consider follow up with your Primary Care Provider. ?________________________________________________________ ? ?The [facility] providers would like to encourage you to use [facility] to communicate with providers for non-urgent requests or questions.  Due to long hold times on the telephone, sending your provider a message by [facility] may be a faster and more efficient way to get a response.  Please allow 48 business hours for a response.  Please remember that this is for non-urgent requests.  ?_______________________________________________________ ? ?We have sent the following medications to your pharmacy for you to pick up at your convenience: ? ?PANTOPRAZOLE ? ?You will need an ultrasound in July 2023.  We will contact you to schedule this appointment. ? ?You will need a follow up appointment in 6 months (September 2023).  You will need lab work prior to this appointment.  We will contact you to get this scheduled. ? ?Thank you for entrusting me with your care and choosing [facility]. ? ?Dr Ardis Hughs ? ?

## 2022-02-12 ENCOUNTER — Other Ambulatory Visit: Payer: Self-pay | Admitting: Neurosurgery

## 2022-02-12 ENCOUNTER — Encounter (HOSPITAL_COMMUNITY): Payer: Self-pay | Admitting: Neurosurgery

## 2022-02-12 NOTE — Anesthesia Preprocedure Evaluation (Addendum)
Anesthesia Evaluation  ?Patient identified by MRN, date of birth, ID band ?Patient awake ? ? ? ?Reviewed: ?Allergy & Precautions, NPO status , Patient's Chart, lab work & pertinent test results ? ?Airway ?Mallampati: II ? ?TM Distance: >3 FB ?Neck ROM: Full ? ? ? Dental ?no notable dental hx. ?(+) Teeth Intact, Dental Advisory Given ?  ?Pulmonary ?sleep apnea , former smoker,  ?  ?Pulmonary exam normal ? ? ? ? ? ? ? Cardiovascular ?hypertension, Pt. on medications and Pt. on home beta blockers ?+ CAD, + Past MI and + Cardiac Stents (2010)  ? ?Rhythm:Regular Rate:Normal ? ? ?  ?Neuro/Psych ?Depression negative neurological ROS ?   ? GI/Hepatic ?GERD  Medicated,(+) Cirrhosis  ? Esophageal Varices ? substance abuse ? alcohol use,   ?Endo/Other  ?diabetes, Type 2, Oral Hypoglycemic Agents, Insulin Dependent ? Renal/GU ?  ? ?  ?Musculoskeletal ? ?(+) Arthritis  (stenosis), Osteoarthritis,   ? Abdominal ?Normal abdominal exam  (+)   ?Peds ? Hematology ? ?(+) Blood dyscrasia, anemia ,   ?Anesthesia Other Findings ? ? Reproductive/Obstetrics ? ?  ? ? ? ? ? ? ? ? ? ? ? ? ? ?  ?  ? ? ? ? ? ? ?Anesthesia Physical ?Anesthesia Plan ? ?ASA: 3 ? ?Anesthesia Plan: General  ? ?Post-op Pain Management:   ? ?Induction: Intravenous ? ?PONV Risk Score and Plan: 2 and Ondansetron, Dexamethasone and Treatment may vary due to age or medical condition ? ?Airway Management Planned: Mask and Oral ETT ? ?Additional Equipment: None ? ?Intra-op Plan:  ? ?Post-operative Plan: Extubation in OR ? ?Informed Consent: I have reviewed the patients History and Physical, chart, labs and discussed the procedure including the risks, benefits and alternatives for the proposed anesthesia with the patient or authorized representative who has indicated his/her understanding and acceptance.  ? ? ? ?Dental advisory given ? ?Plan Discussed with: CRNA ? ?Anesthesia Plan Comments: (Lab Results ?     Component                Value                Date                 ?     WBC                      4.7                 02/15/2022           ?     HGB                      9.1 (L)             02/15/2022           ?     HCT                      28.1 (L)            02/15/2022           ?     MCV                      104.5 (H)           02/15/2022           ?  PLT                      115 (L)             02/15/2022           ?Lab Results ?     Component                Value               Date                 ?     NA                       137                 02/15/2022           ?     K                        4.1                 02/15/2022           ?     CO2                      22                  02/15/2022           ?     GLUCOSE                  127 (H)             02/15/2022           ?     BUN                      17                  02/15/2022           ?     CREATININE               1.36 (H)            02/15/2022           ?     CALCIUM                  9.3                 02/15/2022           ?     GFRNONAA                 55 (L)              02/15/2022           ? ?PAT note by Karoline Caldwell, PA-C: ?Follows with cardiology for hx of?coronary artery disease with two-vessel PCI in 2010, hypertension, hyperlipidemia.?2019 he presented shortness of breath and chest discomfort. ?Stress test was abnormal. ?He was taken for cardiac catheterization but hemoglobin was 5.6. ?Ultimately diagnosed with cirrhosis, grade 2 esophageal varices, nonbleeding gastric ulcer. Beta-blocker was changed to nadolol.?Had cath?08/31/18 showing patent stents in circumflex and RCA with only mild restenosis. ?He was recommended for medical therapy.?Last seen?by cardiology?02/02/2022 for preop  clearance.  Per note, "Preoperative cardiovascular examination?-upcoming back surgery. ?Despite significant back and knee??pain has good exercise tolerance. ?Reports no anginal symptoms. ?EKG today is without acute ST/T wave changes. According to the Revised Cardiac Risk Index (RCRI),  his?Perioperative Risk of Major Cardiac Event is (%): 11. His?Functional Capacity in METs is: 4.4?according to the Duke Activity Status Index (DASI). ?He is deemed acceptable risk for the planned procedure without additional cardiovascular testing. ?Will route to surgeon's office via epic fax function." ?? ?Was admitted October 2022 with septic arthritis of the left knee. Per last PCP note 01/12/22, infection resolved, no longer seeing ID.  ?? ?Recently seen in ED 12/26/21 for hypoglycemic seizure.  Subsequently followed up with his endocrinologist Dr. Cruzita Lederer 01/28/2022.  He is now on a freestyle libre CGM.  Most recent A1c on 01/21/2022 was 5.9. ?? ?Hx of alcoholic cirrhosis with secondary esophageal varices. Followed by Dr. Ardis Hughs. Last endoscopy 04/19/20 showed?Several trunks of large (> 5 mm) varices were found in the lower third of the esophagus. None with signs of recent or impending bleeding. Moderate to severe changes of portal gastropathy. No gastric varices. The previously noted gastic ulcers have healed.?Recommended increase propanolol.  He is also maintained on Lasix and Aldactone for lower extremity edema and mild ascites. Last seen by Dr. Ardis Hughs?12/18/21. Per note,?"MELD score 8, still drinks, only on weekends. ?His edema and ascites are under much better control on his current diuretic regimen he will continue that. ?He has been having trouble with his proton pump inhibitor prescription and so we will represcribe it. ?I think he probably only needs once daily Protonix taken shortly before his breakfast meal. He needs hepatoma screening with an ultrasound in July 2023. ?He needs a follow-up with me in 6 months with MELD staging labs and alpha-fetoprotein a few days beforehand. He is having a back surgery next week. ?I wished him luck with that" ?? ?History of OSA, does not use CPAP. ? ?BMP 02/05/2022 reviewed, unremarkable.  Last CBC 12/26/2021 showed stable chronic anemia with hemoglobin 10.1.  Platelets  mildly low at 122 which also appears chronic. ?? ?Patient will need day of surgery labs and evaluation. ?? ?EKG 11/21/21: Sinus bradycardia. Rate 51. ?? ?Cath 08/31/18: ?1. Patent stents in the left circumflex with mild restenosis ?2. Patent stents in the RCA with mild in stent restenosis ?3. Widely patent left main and LAD ?4. Mildly elevated LVEDP ?? ?Stable coronary anatomy without high-grade coronary stenosis. Medical therapy for CAD. OK to proceed with GI evaluation as needed. ?? ?TTE 08/28/2018: ?- Left ventricle: The cavity size was normal. Systolic function was  ???normal. The estimated ejection fraction was in the range of 60%  ???to 65%. Wall motion was normal; there were no regional wall  ???motion abnormalities.  ?- Aortic valve: There was no regurgitation.  ?- Aorta: Ascending aortic diameter: 40 mm (S).  ?- Ascending aorta: The ascending aorta was mildly dilated.  ?- Mitral valve: There was trivial regurgitation.  ?- Left atrium: The atrium was moderately dilated.  ?- Right ventricle: The cavity size was moderately dilated. Wall  ???thickness was normal. Systolic function was normal.  ?- Right atrium: The atrium was mildly dilated.  ?- Atrial septum: No defect or patent foramen ovale was identified.  ?- Tricuspid valve: There was trivial regurgitation.  ?- Pulmonic valve: There was no significant regurgitation.  ?- Pulmonary arteries: PA peak pressure: 32 mm Hg (S).  ? ?Impressions:  ? ?- Normal to hyperdynamic LV function, no regional wall  motion  ???abnormalities. Mildly dilated ascending aorta. Moderately dilated  ???RV with normal to hyperdynamic function.? ?? ? ?)  ? ? ? ? ? ?Anesthesia Quick Evaluation ? ?

## 2022-02-12 NOTE — Progress Notes (Signed)
Anesthesia Chart Review: ? ?Follows with cardiology for hx of coronary artery disease with two-vessel PCI in 2010, hypertension, hyperlipidemia. 2019 he presented shortness of breath and chest discomfort.  Stress test was abnormal.  He was taken for cardiac catheterization but hemoglobin was 5.6.  Ultimately diagnosed with cirrhosis, grade 2 esophageal varices, nonbleeding gastric ulcer. Beta-blocker was changed to nadolol. Had cath 08/31/18 showing patent stents in circumflex and RCA with only mild restenosis.  He was recommended for medical therapy. Last seen by cardiology 02/02/2022 for preop clearance.  Per note, "Preoperative cardiovascular examination -upcoming back surgery.  Despite significant back and knee  pain has good exercise tolerance.  Reports no anginal symptoms.  EKG today is without acute ST/T wave changes. According to the Revised Cardiac Risk Index (RCRI), his Perioperative Risk of Major Cardiac Event is (%): 11. His Functional Capacity in METs is: 4.4 according to the Duke Activity Status Index (DASI).  He is deemed acceptable risk for the planned procedure without additional cardiovascular testing.  Will route to surgeon's office via epic fax function." ?  ?Was admitted October 2022 with septic arthritis of the left knee. Per last PCP note 01/12/22, infection resolved, no longer seeing ID.  ?  ?Recently seen in ED 12/26/21 for hypoglycemic seizure.  Subsequently followed up with his endocrinologist Dr. Cruzita Lederer 01/28/2022.  He is now on a freestyle libre CGM.  Most recent A1c on 01/21/2022 was 5.9. ?  ?Hx of alcoholic cirrhosis with secondary esophageal varices. Followed by Dr. Ardis Hughs. Last endoscopy 04/19/20 showed Several trunks of large (> 5 mm) varices were found in the lower third of the esophagus. None with signs of recent or impending bleeding. Moderate to severe changes of portal gastropathy. No gastric varices. The previously noted gastic ulcers have healed. Recommended increase propanolol.  He  is also maintained on Lasix and Aldactone for lower extremity edema and mild ascites. Last seen by Dr. Ardis Hughs 12/18/21. Per note, "MELD score 8, still drinks, only on weekends.  His edema and ascites are under much better control on his current diuretic regimen he will continue that.  He has been having trouble with his proton pump inhibitor prescription and so we will represcribe it.  I think he probably only needs once daily Protonix taken shortly before his breakfast meal. He needs hepatoma screening with an ultrasound in July 2023.  He needs a follow-up with me in 6 months with MELD staging labs and alpha-fetoprotein a few days beforehand. He is having a back surgery next week.  I wished him luck with that" ?  ?History of OSA, does not use CPAP. ? ?BMP 02/05/2022 reviewed, unremarkable.  Last CBC 12/26/2021 showed stable chronic anemia with hemoglobin 10.1.  Platelets mildly low at 122 which also appears chronic. ?  ?Patient will need day of surgery labs and evaluation. ?  ?EKG 11/21/21: Sinus bradycardia. Rate 51. ?  ?Cath 08/31/18: ?1. Patent stents in the left circumflex with mild restenosis ?2. Patent stents in the RCA with mild in stent restenosis ?3. Widely patent left main and LAD ?4. Mildly elevated LVEDP ?  ?Stable coronary anatomy without high-grade coronary stenosis. Medical therapy for CAD. OK to proceed with GI evaluation as needed. ?  ?TTE 08/28/2018: ?- Left ventricle: The cavity size was normal. Systolic function was  ?  normal. The estimated ejection fraction was in the range of 60%  ?  to 65%. Wall motion was normal; there were no regional wall  ?  motion abnormalities.  ?-  Aortic valve: There was no regurgitation.  ?- Aorta: Ascending aortic diameter: 40 mm (S).  ?- Ascending aorta: The ascending aorta was mildly dilated.  ?- Mitral valve: There was trivial regurgitation.  ?- Left atrium: The atrium was moderately dilated.  ?- Right ventricle: The cavity size was moderately dilated. Wall  ?   thickness was normal. Systolic function was normal.  ?- Right atrium: The atrium was mildly dilated.  ?- Atrial septum: No defect or patent foramen ovale was identified.  ?- Tricuspid valve: There was trivial regurgitation.  ?- Pulmonic valve: There was no significant regurgitation.  ?- Pulmonary arteries: PA peak pressure: 32 mm Hg (S).  ? ?Impressions:  ? ?- Normal to hyperdynamic LV function, no regional wall motion  ?  abnormalities. Mildly dilated ascending aorta. Moderately dilated  ?  RV with normal to hyperdynamic function.  ? ? ? ?Karoline Caldwell, PA-C ?[facility]/Anesthesiology ?Phone [phone] ?02/12/2022 1:46 PM ? ?

## 2022-02-12 NOTE — Progress Notes (Signed)
DUE TO COVID-19 ONLY ONE VISITOR IS ALLOWED TO COME WITH YOU AND STAY IN THE WAITING ROOM ONLY DURING PRE OP AND PROCEDURE DAY OF SURGERY.  ? ?Two VISITORS MAY VISIT WITH YOU AFTER SURGERY IN YOUR PRIVATE ROOM DURING VISITING HOURS ONLY! ? ?PCP - Dr Viviana Simpler ?Cardiologist - Dr Sherren Mocha ?Internal Med - Dr Philemon Kingdom ? ?Chest x-ray - 12/26/21 (1V) ?EKG - 02/02/22 ?Stress Test - 08/28/18 ?ECHO - 08/28/18 ?Cardiac Cath - 08/31/18 ? ?ICD Pacemaker/Loop - n/a ? ?Sleep Study -  Yes ?CPAP - does not use CPAP ? ?Diabetes Type 1, Freestyle Libre on right upper arm. ? ?Do not take Metformin on the morning of surgery. ?     ?THE MORNING OF SURGERY, take 13 units of Lantus insulin. ? ?If your blood sugar is less than 70 mg/dL, you will need to treat for low blood sugar: ?Treat a low blood sugar (less than 70 mg/dL) with ? cup of clear juice (cranberry or apple), 4 glucose tablets, OR glucose gel. ?Recheck blood sugar in 15 minutes after treatment (to make sure it is greater than 70 mg/dL). If your blood sugar is not greater than 70 mg/dL on recheck, call [phone] for further instructions. ? ?Anesthesia review: Yes ? ?STOP now taking any Aspirin (unless otherwise instructed by your surgeon), Aleve, Naproxen, Ibuprofen, Motrin, Advil, Goody's, BC's, all herbal medications, fish oil, and all vitamins.  ? ?Coronavirus Screening ?Covid test is scheduled on DOS ?Do you have any of the following symptoms:  ?Cough yes/no: No ?Fever (>100.45F)  yes/no: No ?Runny nose yes/no: No ?Sore throat yes/no: No ?Difficulty breathing/shortness of breath  yes/no: No ? ?Have you traveled in the last 14 days and where? yes/no: No ? ?Patient verbalized understanding of instructions that were given via phone. ?

## 2022-02-15 ENCOUNTER — Ambulatory Visit (HOSPITAL_COMMUNITY): Payer: Medicare Other

## 2022-02-15 ENCOUNTER — Other Ambulatory Visit: Payer: Self-pay

## 2022-02-15 ENCOUNTER — Observation Stay (HOSPITAL_COMMUNITY)
Admission: RE | Admit: 2022-02-15 | Discharge: 2022-02-16 | Disposition: A | Discharge status: Home / Self Care | Payer: Medicare Other | Attending: Neurosurgery | Admitting: Neurosurgery

## 2022-02-15 ENCOUNTER — Ambulatory Visit (HOSPITAL_COMMUNITY): Payer: Medicare Other | Admitting: Physician Assistant

## 2022-02-15 ENCOUNTER — Ambulatory Visit (HOSPITAL_BASED_OUTPATIENT_CLINIC_OR_DEPARTMENT_OTHER): Payer: Medicare Other | Admitting: Physician Assistant

## 2022-02-15 ENCOUNTER — Encounter (HOSPITAL_COMMUNITY)
Admission: RE | Disposition: A | Discharge status: Home / Self Care | Payer: Self-pay | Source: Home / Self Care | Attending: Neurosurgery

## 2022-02-15 ENCOUNTER — Encounter (HOSPITAL_COMMUNITY): Payer: Self-pay | Admitting: Neurosurgery

## 2022-02-15 DIAGNOSIS — Z7984 Long term (current) use of oral hypoglycemic drugs: Secondary | ICD-10-CM | POA: Insufficient documentation

## 2022-02-15 DIAGNOSIS — Z794 Long term (current) use of insulin: Secondary | ICD-10-CM | POA: Insufficient documentation

## 2022-02-15 DIAGNOSIS — Z79899 Other long term (current) drug therapy: Secondary | ICD-10-CM | POA: Diagnosis not present

## 2022-02-15 DIAGNOSIS — I252 Old myocardial infarction: Secondary | ICD-10-CM | POA: Diagnosis not present

## 2022-02-15 DIAGNOSIS — M48062 Spinal stenosis, lumbar region with neurogenic claudication: Principal | ICD-10-CM | POA: Diagnosis present

## 2022-02-15 DIAGNOSIS — E119 Type 2 diabetes mellitus without complications: Secondary | ICD-10-CM | POA: Diagnosis not present

## 2022-02-15 DIAGNOSIS — I2510 Atherosclerotic heart disease of native coronary artery without angina pectoris: Secondary | ICD-10-CM

## 2022-02-15 DIAGNOSIS — Z87891 Personal history of nicotine dependence: Secondary | ICD-10-CM | POA: Diagnosis not present

## 2022-02-15 DIAGNOSIS — M5136 Other intervertebral disc degeneration, lumbar region: Secondary | ICD-10-CM | POA: Diagnosis not present

## 2022-02-15 DIAGNOSIS — I10 Essential (primary) hypertension: Secondary | ICD-10-CM | POA: Diagnosis not present

## 2022-02-15 DIAGNOSIS — Z955 Presence of coronary angioplasty implant and graft: Secondary | ICD-10-CM | POA: Diagnosis not present

## 2022-02-15 HISTORY — PX: LUMBAR LAMINECTOMY/DECOMPRESSION MICRODISCECTOMY: SHX5026

## 2022-02-15 LAB — CBC
HCT: 28.1 % — ABNORMAL LOW (ref 39.0–52.0)
Hemoglobin: 9.1 g/dL — ABNORMAL LOW (ref 13.0–17.0)
MCH: 33.8 pg (ref 26.0–34.0)
MCHC: 32.4 g/dL (ref 30.0–36.0)
MCV: 104.5 fL — ABNORMAL HIGH (ref 80.0–100.0)
Platelets: 115 10*3/uL — ABNORMAL LOW (ref 150–400)
RBC: 2.69 MIL/uL — ABNORMAL LOW (ref 4.22–5.81)
RDW: 15.2 % (ref 11.5–15.5)
WBC: 4.7 10*3/uL (ref 4.0–10.5)
nRBC: 0 % (ref 0.0–0.2)

## 2022-02-15 LAB — COMPREHENSIVE METABOLIC PANEL
ALT: 32 U/L (ref 0–44)
AST: 54 U/L — ABNORMAL HIGH (ref 15–41)
Albumin: 3.3 g/dL — ABNORMAL LOW (ref 3.5–5.0)
Alkaline Phosphatase: 103 U/L (ref 38–126)
Anion gap: 12 (ref 5–15)
BUN: 17 mg/dL (ref 8–23)
CO2: 22 mmol/L (ref 22–32)
Calcium: 9.3 mg/dL (ref 8.9–10.3)
Chloride: 103 mmol/L (ref 98–111)
Creatinine, Ser: 1.36 mg/dL — ABNORMAL HIGH (ref 0.61–1.24)
GFR, Estimated: 55 mL/min — ABNORMAL LOW (ref >60)
Glucose, Bld: 127 mg/dL — ABNORMAL HIGH (ref 70–99)
Potassium: 4.1 mmol/L (ref 3.5–5.1)
Sodium: 137 mmol/L (ref 135–145)
Total Bilirubin: 1 mg/dL (ref 0.3–1.2)
Total Protein: 7.2 g/dL (ref 6.5–8.1)

## 2022-02-15 LAB — GLUCOSE, CAPILLARY
Glucose-Capillary: 131 mg/dL — ABNORMAL HIGH (ref 70–99)
Glucose-Capillary: 119 mg/dL — ABNORMAL HIGH (ref 70–99)
Glucose-Capillary: 114 mg/dL — ABNORMAL HIGH (ref 70–99)
Glucose-Capillary: 119 mg/dL — ABNORMAL HIGH (ref 70–99)
Glucose-Capillary: 241 mg/dL — ABNORMAL HIGH (ref 70–99)
Glucose-Capillary: 380 mg/dL — ABNORMAL HIGH (ref 70–99)

## 2022-02-15 SURGERY — LUMBAR LAMINECTOMY/DECOMPRESSION MICRODISCECTOMY 3 LEVELS
Anesthesia: General | Site: Back

## 2022-02-15 MED ORDER — FENTANYL CITRATE (PF) 100 MCG/2ML IJ SOLN
INTRAMUSCULAR | Status: AC
  Filled 2022-02-15: qty 2

## 2022-02-15 MED ORDER — ONDANSETRON HCL 4 MG PO TABS: 4.0000 mg | ORAL_TABLET | Freq: Four times a day (QID) | ORAL | Status: DC | PRN

## 2022-02-15 MED ORDER — ADULT MULTIVITAMIN W/MINERALS CH
1.0000 | ORAL_TABLET | Freq: Every day | ORAL | Status: DC
  Administered 2022-02-15 (×2): 1 via ORAL
  Filled 2022-02-15: qty 1

## 2022-02-15 MED ORDER — KETOROLAC TROMETHAMINE 30 MG/ML IJ SOLN
INTRAMUSCULAR | Status: AC
  Filled 2022-02-15: qty 1

## 2022-02-15 MED ORDER — PROPRANOLOL HCL 80 MG PO TABS
80.0000 mg | ORAL_TABLET | Freq: Two times a day (BID) | ORAL | Status: DC
  Administered 2022-02-15 – 2022-02-16 (×2): 80 mg via ORAL
  Filled 2022-02-15 (×3): qty 1

## 2022-02-15 MED ORDER — HYDROCODONE-ACETAMINOPHEN 10-325 MG PO TABS
1.0000 | ORAL_TABLET | ORAL | Status: DC | PRN
  Administered 2022-02-15 – 2022-02-16 (×4): 2 via ORAL
  Filled 2022-02-15 (×4): qty 2

## 2022-02-15 MED ORDER — METFORMIN HCL 500 MG PO TABS
1000.0000 mg | ORAL_TABLET | Freq: Two times a day (BID) | ORAL | Status: DC
  Administered 2022-02-16: 1000 mg via ORAL
  Filled 2022-02-15: qty 2

## 2022-02-15 MED ORDER — ACETAMINOPHEN 650 MG RE SUPP: 650.0000 mg | RECTAL | Status: DC | PRN

## 2022-02-15 MED ORDER — FERROUS SULFATE 325 (65 FE) MG PO TABS
325.0000 mg | ORAL_TABLET | Freq: Every day | ORAL | Status: DC
  Administered 2022-02-16: 325 mg via ORAL
  Filled 2022-02-15: qty 1

## 2022-02-15 MED ORDER — LIDOCAINE 2% (20 MG/ML) 5 ML SYRINGE
INTRAMUSCULAR | Status: AC
  Filled 2022-02-15: qty 5

## 2022-02-15 MED ORDER — PHENYLEPHRINE 40 MCG/ML (10ML) SYRINGE FOR IV PUSH (FOR BLOOD PRESSURE SUPPORT)
PREFILLED_SYRINGE | INTRAVENOUS | Status: AC
  Filled 2022-02-15: qty 10

## 2022-02-15 MED ORDER — CHLORHEXIDINE GLUCONATE 0.12 % MT SOLN
15.0000 mL | Freq: Once | OROMUCOSAL | Status: AC
  Administered 2022-02-15: 15 mL via OROMUCOSAL
  Filled 2022-02-15: qty 15

## 2022-02-15 MED ORDER — FLUTICASONE PROPIONATE 50 MCG/ACT NA SUSP: 1.0000 | Freq: Every day | NASAL | Status: DC

## 2022-02-15 MED ORDER — SPIRONOLACTONE 25 MG PO TABS
100.0000 mg | ORAL_TABLET | Freq: Every day | ORAL | Status: DC
  Administered 2022-02-16: 100 mg via ORAL
  Filled 2022-02-15: qty 4

## 2022-02-15 MED ORDER — DEXAMETHASONE SODIUM PHOSPHATE 10 MG/ML IJ SOLN
INTRAMUSCULAR | Status: DC | PRN
  Administered 2022-02-15: 10 mg via INTRAVENOUS

## 2022-02-15 MED ORDER — CHLORHEXIDINE GLUCONATE CLOTH 2 % EX PADS: 6.0000 | MEDICATED_PAD | Freq: Once | CUTANEOUS | Status: DC

## 2022-02-15 MED ORDER — INSULIN ASPART 100 UNIT/ML IJ SOLN
0.0000 [IU] | Freq: Every day | INTRAMUSCULAR | Status: DC
  Administered 2022-02-15: 5 [IU] via SUBCUTANEOUS

## 2022-02-15 MED ORDER — PROPOFOL 10 MG/ML IV BOLUS
INTRAVENOUS | Status: AC
  Filled 2022-02-15: qty 20

## 2022-02-15 MED ORDER — PANTOPRAZOLE SODIUM 40 MG PO TBEC
40.0000 mg | DELAYED_RELEASE_TABLET | Freq: Every day | ORAL | Status: DC
  Administered 2022-02-16: 40 mg via ORAL
  Filled 2022-02-15: qty 1

## 2022-02-15 MED ORDER — HYDROMORPHONE HCL 1 MG/ML IJ SOLN
1.0000 mg | INTRAMUSCULAR | Status: DC | PRN
  Administered 2022-02-15: 1 mg via INTRAVENOUS
  Filled 2022-02-15: qty 1

## 2022-02-15 MED ORDER — LACTATED RINGERS IV SOLN: INTRAVENOUS | Status: DC

## 2022-02-15 MED ORDER — SUGAMMADEX SODIUM 200 MG/2ML IV SOLN
INTRAVENOUS | Status: DC | PRN
  Administered 2022-02-15: 200 mg via INTRAVENOUS

## 2022-02-15 MED ORDER — FREESTYLE LIBRE 2 READER DEVI: 1.0000 | Freq: Every day | Status: DC

## 2022-02-15 MED ORDER — ACETAMINOPHEN 325 MG PO TABS: 650.0000 mg | ORAL_TABLET | ORAL | Status: DC | PRN

## 2022-02-15 MED ORDER — THROMBIN 5000 UNITS EX SOLR
CUTANEOUS | Status: DC | PRN
  Administered 2022-02-15: 10000 [IU] via TOPICAL

## 2022-02-15 MED ORDER — DEXAMETHASONE SODIUM PHOSPHATE 10 MG/ML IJ SOLN
INTRAMUSCULAR | Status: AC
  Filled 2022-02-15: qty 1

## 2022-02-15 MED ORDER — INSULIN LISPRO (0.5 UNIT DIAL) 100 UNIT/ML (KWIKPEN JR): 6.0000 [IU] | PEN_INJECTOR | Freq: Three times a day (TID) | SUBCUTANEOUS | Status: DC

## 2022-02-15 MED ORDER — BUPIVACAINE HCL (PF) 0.25 % IJ SOLN
INTRAMUSCULAR | Status: AC
  Filled 2022-02-15: qty 30

## 2022-02-15 MED ORDER — INSULIN ASPART 100 UNIT/ML IJ SOLN: 0.0000 [IU] | INTRAMUSCULAR | Status: DC | PRN

## 2022-02-15 MED ORDER — ROCURONIUM BROMIDE 10 MG/ML (PF) SYRINGE
PREFILLED_SYRINGE | INTRAVENOUS | Status: DC | PRN
Start: 2022-02-15 — End: 2022-02-15
  Administered 2022-02-15: 60 mg via INTRAVENOUS

## 2022-02-15 MED ORDER — LIDOCAINE 2% (20 MG/ML) 5 ML SYRINGE
INTRAMUSCULAR | Status: DC | PRN
  Administered 2022-02-15: 80 mg via INTRAVENOUS

## 2022-02-15 MED ORDER — ALBUMIN HUMAN 5 % IV SOLN
INTRAVENOUS | Status: DC | PRN
Start: 2022-02-15 — End: 2022-02-15

## 2022-02-15 MED ORDER — CYCLOBENZAPRINE HCL 10 MG PO TABS
10.0000 mg | ORAL_TABLET | Freq: Three times a day (TID) | ORAL | Status: DC | PRN
  Administered 2022-02-15: 10 mg via ORAL
  Filled 2022-02-15: qty 1

## 2022-02-15 MED ORDER — SODIUM CHLORIDE 0.9 % IV SOLN
250.0000 mL | INTRAVENOUS | Status: DC
  Administered 2022-02-15: 250 mL via INTRAVENOUS

## 2022-02-15 MED ORDER — TAMSULOSIN HCL 0.4 MG PO CAPS
0.4000 mg | ORAL_CAPSULE | Freq: Every day | ORAL | Status: DC
Start: 2022-02-16 — End: 2022-02-16
  Administered 2022-02-16: 0.4 mg via ORAL
  Filled 2022-02-15: qty 1

## 2022-02-15 MED ORDER — FREESTYLE LIBRE 2 SENSOR MISC: 1.0000 | Status: DC

## 2022-02-15 MED ORDER — SODIUM CHLORIDE 0.9% FLUSH: 3.0000 mL | Freq: Two times a day (BID) | INTRAVENOUS | Status: DC

## 2022-02-15 MED ORDER — PHENYLEPHRINE 40 MCG/ML (10ML) SYRINGE FOR IV PUSH (FOR BLOOD PRESSURE SUPPORT)
PREFILLED_SYRINGE | INTRAVENOUS | Status: DC | PRN
  Administered 2022-02-15 (×2): 80 ug via INTRAVENOUS
  Administered 2022-02-15: 40 ug via INTRAVENOUS

## 2022-02-15 MED ORDER — ACETAMINOPHEN 10 MG/ML IV SOLN
INTRAVENOUS | Status: AC
Start: 2022-02-15 — End: 2022-02-16
  Filled 2022-02-15: qty 100

## 2022-02-15 MED ORDER — ORAL CARE MOUTH RINSE: 15.0000 mL | Freq: Once | OROMUCOSAL | Status: AC

## 2022-02-15 MED ORDER — ACETAMINOPHEN 10 MG/ML IV SOLN
1000.0000 mg | Freq: Once | INTRAVENOUS | Status: DC | PRN
  Administered 2022-02-15: 1000 mg via INTRAVENOUS

## 2022-02-15 MED ORDER — FUROSEMIDE 40 MG PO TABS
40.0000 mg | ORAL_TABLET | Freq: Every day | ORAL | Status: DC
  Administered 2022-02-15 – 2022-02-16 (×2): 40 mg via ORAL
  Filled 2022-02-15 (×2): qty 1

## 2022-02-15 MED ORDER — HEMOSTATIC AGENTS (NO CHARGE) OPTIME
TOPICAL | Status: DC | PRN
  Administered 2022-02-15: 1 via TOPICAL

## 2022-02-15 MED ORDER — SODIUM CHLORIDE 0.9% FLUSH: 3.0000 mL | INTRAVENOUS | Status: DC | PRN

## 2022-02-15 MED ORDER — ONDANSETRON HCL 4 MG/2ML IJ SOLN: 4.0000 mg | Freq: Four times a day (QID) | INTRAMUSCULAR | Status: DC | PRN

## 2022-02-15 MED ORDER — HYDROCODONE-ACETAMINOPHEN 5-325 MG PO TABS: 1.0000 | ORAL_TABLET | ORAL | Status: DC | PRN

## 2022-02-15 MED ORDER — FENTANYL CITRATE (PF) 100 MCG/2ML IJ SOLN
25.0000 ug | INTRAMUSCULAR | Status: DC | PRN
  Administered 2022-02-15 (×3): 50 ug via INTRAVENOUS

## 2022-02-15 MED ORDER — CEFAZOLIN SODIUM-DEXTROSE 2-4 GM/100ML-% IV SOLN
2.0000 g | INTRAVENOUS | Status: AC
  Administered 2022-02-15: 2 g via INTRAVENOUS
  Filled 2022-02-15: qty 100

## 2022-02-15 MED ORDER — FENTANYL CITRATE (PF) 250 MCG/5ML IJ SOLN
INTRAMUSCULAR | Status: DC | PRN
  Administered 2022-02-15 (×4): 50 ug via INTRAVENOUS

## 2022-02-15 MED ORDER — CEFAZOLIN SODIUM-DEXTROSE 1-4 GM/50ML-% IV SOLN
1.0000 g | Freq: Three times a day (TID) | INTRAVENOUS | Status: AC
  Administered 2022-02-15 – 2022-02-16 (×2): 1 g via INTRAVENOUS
  Filled 2022-02-15 (×2): qty 50

## 2022-02-15 MED ORDER — THROMBIN 5000 UNITS EX SOLR
CUTANEOUS | Status: AC
  Filled 2022-02-15: qty 10000

## 2022-02-15 MED ORDER — INSULIN GLARGINE 100 UNIT/ML ~~LOC~~ SOLN
26.0000 [IU] | Freq: Every day | SUBCUTANEOUS | Status: DC
  Filled 2022-02-15: qty 0.26

## 2022-02-15 MED ORDER — KETAMINE HCL 50 MG/5ML IJ SOSY
PREFILLED_SYRINGE | INTRAMUSCULAR | Status: AC
  Filled 2022-02-15: qty 5

## 2022-02-15 MED ORDER — MIDAZOLAM HCL 2 MG/2ML IJ SOLN
INTRAMUSCULAR | Status: AC
  Filled 2022-02-15: qty 2

## 2022-02-15 MED ORDER — ONDANSETRON HCL 4 MG/2ML IJ SOLN
INTRAMUSCULAR | Status: DC | PRN
  Administered 2022-02-15: 4 mg via INTRAVENOUS

## 2022-02-15 MED ORDER — MENTHOL 3 MG MT LOZG: 1.0000 | LOZENGE | OROMUCOSAL | Status: DC | PRN

## 2022-02-15 MED ORDER — KETOROLAC TROMETHAMINE 15 MG/ML IJ SOLN
15.0000 mg | Freq: Four times a day (QID) | INTRAMUSCULAR | Status: DC
  Administered 2022-02-15 – 2022-02-16 (×3): 15 mg via INTRAVENOUS
  Filled 2022-02-15 (×3): qty 1

## 2022-02-15 MED ORDER — BUPIVACAINE HCL (PF) 0.25 % IJ SOLN
INTRAMUSCULAR | Status: DC | PRN
  Administered 2022-02-15: 20 mL

## 2022-02-15 MED ORDER — 0.9 % SODIUM CHLORIDE (POUR BTL) OPTIME
TOPICAL | Status: DC | PRN
  Administered 2022-02-15: 1000 mL

## 2022-02-15 MED ORDER — PHENOL 1.4 % MT LIQD: 1.0000 | OROMUCOSAL | Status: DC | PRN

## 2022-02-15 MED ORDER — FENTANYL CITRATE (PF) 250 MCG/5ML IJ SOLN
INTRAMUSCULAR | Status: AC
  Filled 2022-02-15: qty 5

## 2022-02-15 MED ORDER — KETAMINE HCL-SODIUM CHLORIDE 100-0.9 MG/10ML-% IV SOSY
PREFILLED_SYRINGE | INTRAVENOUS | Status: DC | PRN
  Administered 2022-02-15: 30 mg via INTRAVENOUS

## 2022-02-15 MED ORDER — ROSUVASTATIN CALCIUM 5 MG PO TABS
10.0000 mg | ORAL_TABLET | Freq: Every day | ORAL | Status: DC
Start: 2022-02-16 — End: 2022-02-16
  Administered 2022-02-16: 10 mg via ORAL
  Filled 2022-02-15: qty 2

## 2022-02-15 MED ORDER — PAROXETINE HCL 20 MG PO TABS
40.0000 mg | ORAL_TABLET | Freq: Every day | ORAL | Status: DC
  Administered 2022-02-16: 40 mg via ORAL
  Filled 2022-02-15: qty 2

## 2022-02-15 MED ORDER — ONDANSETRON HCL 4 MG/2ML IJ SOLN
INTRAMUSCULAR | Status: AC
  Filled 2022-02-15: qty 2

## 2022-02-15 MED ORDER — PROPOFOL 10 MG/ML IV BOLUS
INTRAVENOUS | Status: DC | PRN
  Administered 2022-02-15: 170 mg via INTRAVENOUS

## 2022-02-15 MED ORDER — INSULIN ASPART 100 UNIT/ML IJ SOLN
0.0000 [IU] | Freq: Three times a day (TID) | INTRAMUSCULAR | Status: DC
  Administered 2022-02-15: 5 [IU] via SUBCUTANEOUS
  Administered 2022-02-16: 3 [IU] via SUBCUTANEOUS

## 2022-02-15 MED ORDER — MIDAZOLAM HCL 5 MG/5ML IJ SOLN
INTRAMUSCULAR | Status: DC | PRN
  Administered 2022-02-15: 2 mg via INTRAVENOUS

## 2022-02-15 MED ORDER — PHENYLEPHRINE HCL-NACL 20-0.9 MG/250ML-% IV SOLN
INTRAVENOUS | Status: DC | PRN
  Administered 2022-02-15: 50 ug/min via INTRAVENOUS

## 2022-02-15 MED ORDER — KETOROLAC TROMETHAMINE 15 MG/ML IJ SOLN
INTRAMUSCULAR | Status: DC | PRN
  Administered 2022-02-15: 15 mg via INTRAVENOUS

## 2022-02-15 SURGICAL SUPPLY — 56 items
ADH SKN CLS APL DERMABOND .7 (GAUZE/BANDAGES/DRESSINGS) ×1
ADH SKN CLS LQ APL DERMABOND (GAUZE/BANDAGES/DRESSINGS) ×1
APL SKNCLS STERI-STRIP NONHPOA (GAUZE/BANDAGES/DRESSINGS) ×1
BAG COUNTER SPONGE SURGICOUNT (BAG) ×3 IMPLANT
BAG DECANTER FOR FLEXI CONT (MISCELLANEOUS) ×1 IMPLANT
BAG SPNG CNTER NS LX DISP (BAG) ×2
BAND INSRT 18 STRL LF DISP RB (MISCELLANEOUS)
BAND RUBBER #18 3X1/16 STRL (MISCELLANEOUS) ×2 IMPLANT
BENZOIN TINCTURE PRP APPL 2/3 (GAUZE/BANDAGES/DRESSINGS) ×2 IMPLANT
BLADE CLIPPER SURG (BLADE) IMPLANT
BUR CUTTER 7.0 ROUND (BURR) ×2 IMPLANT
CANISTER SUCT 3000ML PPV (MISCELLANEOUS) ×2 IMPLANT
CARTRIDGE OIL MAESTRO DRILL (MISCELLANEOUS) ×1 IMPLANT
DECANTER SPIKE VIAL GLASS SM (MISCELLANEOUS) ×1 IMPLANT
DERMABOND ADHESIVE PROPEN (GAUZE/BANDAGES/DRESSINGS) ×1
DERMABOND ADVANCED (GAUZE/BANDAGES/DRESSINGS) ×1
DERMABOND ADVANCED .7 DNX12 (GAUZE/BANDAGES/DRESSINGS) ×1 IMPLANT
DERMABOND ADVANCED .7 DNX6 (GAUZE/BANDAGES/DRESSINGS) IMPLANT
DIFFUSER DRILL AIR PNEUMATIC (MISCELLANEOUS) ×2 IMPLANT
DRAPE HALF SHEET 40X57 (DRAPES) ×1 IMPLANT
DRAPE LAPAROTOMY 100X72X124 (DRAPES) ×2 IMPLANT
DRAPE MICROSCOPE LEICA (MISCELLANEOUS) ×1 IMPLANT
DRAPE SURG 17X23 STRL (DRAPES) ×4 IMPLANT
DRSG OPSITE POSTOP 4X6 (GAUZE/BANDAGES/DRESSINGS) ×1 IMPLANT
DURAPREP 26ML APPLICATOR (WOUND CARE) ×2 IMPLANT
ELECT REM PT RETURN 9FT ADLT (ELECTROSURGICAL) ×2
ELECTRODE REM PT RTRN 9FT ADLT (ELECTROSURGICAL) ×1 IMPLANT
EVACUATOR 1/8 PVC DRAIN (DRAIN) ×1 IMPLANT
GAUZE 4X4 16PLY ~~LOC~~+RFID DBL (SPONGE) ×1 IMPLANT
GAUZE SPONGE 4X4 12PLY STRL (GAUZE/BANDAGES/DRESSINGS) ×1 IMPLANT
GLOVE EXAM NITRILE XL STR (GLOVE) IMPLANT
GLOVE SURG ENC MOIS LTX SZ6.5 (GLOVE) ×2 IMPLANT
GLOVE SURG LTX SZ9 (GLOVE) ×2 IMPLANT
GLOVE SURG UNDER POLY LF SZ6.5 (GLOVE) ×2 IMPLANT
GOWN STRL REUS W/ TWL LRG LVL3 (GOWN DISPOSABLE) IMPLANT
GOWN STRL REUS W/ TWL XL LVL3 (GOWN DISPOSABLE) ×1 IMPLANT
GOWN STRL REUS W/TWL 2XL LVL3 (GOWN DISPOSABLE) IMPLANT
GOWN STRL REUS W/TWL LRG LVL3 (GOWN DISPOSABLE)
GOWN STRL REUS W/TWL XL LVL3 (GOWN DISPOSABLE) ×2
KIT BASIN OR (CUSTOM PROCEDURE TRAY) ×2 IMPLANT
KIT TURNOVER KIT B (KITS) ×2 IMPLANT
NDL SPNL 22GX3.5 QUINCKE BK (NEEDLE) IMPLANT
NEEDLE HYPO 22GX1.5 SAFETY (NEEDLE) ×2 IMPLANT
NEEDLE SPNL 22GX3.5 QUINCKE BK (NEEDLE) IMPLANT
NS IRRIG 1000ML POUR BTL (IV SOLUTION) ×2 IMPLANT
OIL CARTRIDGE MAESTRO DRILL (MISCELLANEOUS) ×2
PACK LAMINECTOMY NEURO (CUSTOM PROCEDURE TRAY) ×2 IMPLANT
PAD ARMBOARD 7.5X6 YLW CONV (MISCELLANEOUS) ×6 IMPLANT
SPONGE SURGIFOAM ABS GEL SZ50 (HEMOSTASIS) ×2 IMPLANT
SPONGE T-LAP 4X18 ~~LOC~~+RFID (SPONGE) ×2 IMPLANT
STRIP CLOSURE SKIN 1/2X4 (GAUZE/BANDAGES/DRESSINGS) ×2 IMPLANT
SUT VIC AB 2-0 CT1 18 (SUTURE) ×3 IMPLANT
SUT VIC AB 3-0 SH 8-18 (SUTURE) ×3 IMPLANT
TOWEL GREEN STERILE (TOWEL DISPOSABLE) ×2 IMPLANT
TOWEL GREEN STERILE FF (TOWEL DISPOSABLE) ×2 IMPLANT
WATER STERILE IRR 1000ML POUR (IV SOLUTION) ×2 IMPLANT

## 2022-02-15 NOTE — Progress Notes (Signed)
Late entry  ?Spoke to Dr. Gloris Manchester regarding patient's Beta Blocker Propanolol. Patient did not take today, was out of the medication.  Did not give in pre - op per Dr. Gloris Manchester instructions.  ?

## 2022-02-15 NOTE — Anesthesia Postprocedure Evaluation (Signed)
Anesthesia Post Note ? ?Patient: David [name] ? ?Procedure(s) Performed: Laminectomy and Foraminotomy - Lumbar Two-Lumbar Three - Lumbar Three-Lumbar Four - Lumbar Four-Lumbar Five (Back) ? ?  ? ?Patient location during evaluation: PACU ?Anesthesia Type: General ?Level of consciousness: awake and alert ?Pain management: pain level controlled ?Vital Signs Assessment: post-procedure vital signs reviewed and stable ?Respiratory status: spontaneous breathing, nonlabored ventilation, respiratory function stable and patient connected to nasal cannula oxygen ?Cardiovascular status: blood pressure returned to baseline and stable ?Postop Assessment: no apparent nausea or vomiting ?Anesthetic complications: no ? ? ?No notable events documented. ? ?Last Vitals:  ?Vitals:  ? 02/15/22 1437 02/15/22 1509  ?BP: (!) 145/81 (!) 154/85  ?Pulse: 88 97  ?Resp: 17 18  ?Temp: 36.6 ?C   ?SpO2: 99% 99%  ?  ?Last Pain:  ?Vitals:  ? 02/15/22 1437  ?TempSrc:   ?PainSc: Asleep  ? ? ?  ?  ?  ?  ?  ?  ? ?March Rummage Sid Greener ? ? ? ? ?

## 2022-02-15 NOTE — H&P (Signed)
David [name] is an 71 y.o. male.   Chief Complaint: Weakness HPI: 71 year old male with bilateral lower extremity numbness paresthesias and weakness with significant gait instability and frequent falls.  Work-up demonstrates evidence of severe stenosis at L2-3, L3-4 and L4-5.  Patient is status post lower thoracic decompressive surgery for treatment of his lower thoracic myelopathy.  This is helped his symptoms but they remain quite problematic.  The patient presents now for lumbar decompression at L2-3, L3-4 and L4-5 in hopes of improving his symptoms.  Past Medical History:  Diagnosis Date   Allergy    Anemia    Arthritis    Bilateral hands and feet   CAD (coronary artery disease)    s/p bifurcation stenting (DES x2) and stenting bifurcation lesion distal RCA (1 DES)   Cataract    Cirrhosis (HCC)    Depression    Diabetes mellitus    David [name] - type 2   Esophageal varices (HCC)    GERD (gastroesophageal reflux disease)    Hyperlipemia    Hypertension    Myocardial infarction [facility])    Sleep apnea    denies wearing CPAP    Past Surgical History:  Procedure Laterality Date   BIOPSY  09/01/2018   Procedure: BIOPSY;  Surgeon: Napoleon Form, MD;  Location: [facility] ENDOSCOPY;  Service: Endoscopy;;   CATARACT EXTRACTION Bilateral    CORONARY ANGIOPLASTY WITH STENT PLACEMENT   March 2010   Dr. Juanda Chance   ESOPHAGOGASTRODUODENOSCOPY (EGD) WITH PROPOFOL N/A 09/01/2018   Procedure: ESOPHAGOGASTRODUODENOSCOPY (EGD) WITH PROPOFOL;  Surgeon: Napoleon Form, MD;  Location: [facility] ENDOSCOPY;  Service: Endoscopy;  Laterality: N/A;   KNEE ARTHROSCOPY Left 09/07/2021   Procedure: IRRIGATION AND DEBRIDEMENT OF LEFT KNEE; ARTHROSCOPY OF LEFT KNEE, PARTIAL MEDIAL MINISECTOMY.;  Surgeon: Deeann Saint, MD;  Location: [facility] ORS;  Service: Orthopedics;  Laterality: Left;   LEFT HEART CATH AND CORONARY ANGIOGRAPHY N/A 08/31/2018   Procedure: LEFT HEART CATH AND CORONARY ANGIOGRAPHY;  Surgeon:  Tonny Bollman, MD;  Location: [facility] INVASIVE CV LAB;  Service: Cardiovascular;  Laterality: N/A;   THORACIC DISCECTOMY Right 06/19/2021   Procedure: Microdiscectomy - right - Thoracic Eleven-Thoracic Twelve;  Surgeon: Julio Sicks, MD;  Location: [facility] OR;  Service: Neurosurgery;  Laterality: Right;   TONSILLECTOMY  1959    Family History  Problem Relation Age of Onset   Heart failure Father        Deceased 70 y/o   Diabetes Father    Cancer Father        Prostate   Heart failure Mother        Deceased 50 y/o   Alcohol abuse Mother    Schizophrenia Son        Paranoid   Uveitis Son    Cancer Brother        prostate   Colon cancer Neg Hx    Esophageal cancer Neg Hx    Stomach cancer Neg Hx    Liver cancer Neg Hx    Pancreatic cancer Neg Hx    Rectal cancer Neg Hx    Social History:  reports that he quit smoking about 38 years ago. His smoking use included cigarettes. He has never been exposed to tobacco smoke. He has never used smokeless tobacco. He reports current alcohol use of about 24.0 standard drinks per week. He reports that he does not use drugs.  Allergies: No Active Allergies  Medications Prior to Admission  Medication Sig Dispense Refill   carboxymethylcellul-glycerin (OPTIVE) 0.5-0.9 %  ophthalmic solution Place 1 drop into both eyes daily as needed for dry eyes.     ferrous sulfate 325 (65 FE) MG tablet Take 1 tablet (325 mg total) by mouth daily with breakfast. 30 tablet 0   fluticasone (FLONASE) 50 MCG/ACT nasal spray Place 1 spray into both nostrils daily.     furosemide (LASIX) 40 MG tablet Take 1 tablet (40 mg total) by mouth daily. 30 tablet 3   Homeopathic Products (LIVER SUPPORT SL) Place 1 capsule under the tongue 2 (two) times daily.     HYDROcodone-acetaminophen (NORCO/VICODIN) 5-325 MG tablet Take 1 tablet by mouth every 6 (six) hours as needed for moderate pain or severe pain.     insulin glargine (LANTUS SOLOSTAR) 100 UNIT/ML Solostar Pen Inject 26 Units  into the skin daily. 30 mL 3   Insulin lispro (HUMALOG JUNIOR KWIKPEN) 100 UNIT/ML Inject 6-8 Units into the skin 3 (three) times daily before meals. Only with a big meal 15 mL 3   metFORMIN (GLUCOPHAGE) 1000 MG tablet Take 1 tablet (1,000 mg total) by mouth daily with supper. (Patient taking differently: Take 1,000 mg by mouth 2 (two) times daily with a meal.) 90 tablet 3   Multiple Vitamin (MULTIVITAMIN WITH MINERALS) TABS tablet Take 1 tablet by mouth at bedtime. One a day     pantoprazole (PROTONIX) 40 MG tablet Take 1 tablet (40 mg total) by mouth daily. 30 tablet 11   PARoxetine (PAXIL) 40 MG tablet Take 1 tablet (40 mg total) by mouth every morning. 30 tablet 0   propranolol (INDERAL) 80 MG tablet Take 80 mg by mouth 2 (two) times daily.     rosuvastatin (CRESTOR) 10 MG tablet Take 1 tablet (10 mg total) by mouth daily. 30 tablet 0   tamsulosin (FLOMAX) 0.4 MG CAPS capsule Take 1 capsule (0.4 mg total) by mouth daily. 90 capsule 3   colchicine 0.6 MG tablet Take 1 tablet (0.6 mg total) by mouth daily. 30 tablet 0   Continuous Blood Gluc Receiver (FREESTYLE LIBRE 2 READER) DEVI 1 each by Does not apply route daily. 1 each 0   Continuous Blood Gluc Sensor (FREESTYLE LIBRE 2 SENSOR) MISC 1 each by Does not apply route every 14 (fourteen) days. 6 each 3   glucose blood (ONE TOUCH ULTRA TEST) test strip Use to test blood sugar 2 times daily as instructed. Dx: E11.59, E11.65 100 each 5   Insulin Pen Needle 32G X 4 MM MISC Use 4x a day - with insulin 200 each 3   ONETOUCH DELICA LANCETS FINE MISC Use to test blood sugar 2 times daily as instructed. Dx: E11.59, E11.65 100 each 5   spironolactone (ALDACTONE) 100 MG tablet Take 1 tablet (100 mg total) by mouth daily. 30 tablet 3   triamcinolone cream (KENALOG) 0.1 % APPLY TOPICALLY TO SCALP TWO TIMES DAILY AS NEEDED (Patient taking differently: 1 application. daily as needed (Psoriasis).) 30 g 0    Results for orders placed or performed during the  hospital encounter of 02/15/22 (from the past 48 hour(s))  Glucose, capillary     Status: Abnormal   Collection Time: 02/15/22  8:32 AM  Result Value Ref Range   Glucose-Capillary 131 (H) 70 - 99 mg/dL    Comment: Glucose reference range applies only to samples taken after fasting for at least 8 hours.  CBC     Status: Abnormal   Collection Time: 02/15/22  9:04 AM  Result Value Ref Range   WBC 4.7  4.0 - 10.5 K/uL   RBC 2.69 (L) 4.22 - 5.81 MIL/uL   Hemoglobin 9.1 (L) 13.0 - 17.0 g/dL   HCT 16.1 (L) 09.6 - 04.5 %   MCV 104.5 (H) 80.0 - 100.0 fL   MCH 33.8 26.0 - 34.0 pg   MCHC 32.4 30.0 - 36.0 g/dL   RDW 40.9 81.1 - 91.4 %   Platelets 115 (L) 150 - 400 K/uL    Comment: Immature Platelet Fraction may be clinically indicated, consider ordering this additional test [id number] REPEATED TO VERIFY PLATELET COUNT CONFIRMED BY SMEAR    nRBC 0.0 0.0 - 0.2 %    Comment: Performed at [facility], [address] [place], [place] [address]  Comprehensive Metabolic Panel     Status: Abnormal   Collection Time: 02/15/22  9:04 AM  Result Value Ref Range   Sodium 137 135 - 145 mmol/L   Potassium 4.1 3.5 - 5.1 mmol/L   Chloride 103 98 - 111 mmol/L   CO2 22 22 - 32 mmol/L   Glucose, Bld 127 (H) 70 - 99 mg/dL    Comment: Glucose reference range applies only to samples taken after fasting for at least 8 hours.   BUN 17 8 - 23 mg/dL   Creatinine, Ser 7.84 (H) 0.61 - 1.24 mg/dL   Calcium 9.3 8.9 - 69.6 mg/dL   Total Protein 7.2 6.5 - 8.1 g/dL   Albumin 3.3 (L) 3.5 - 5.0 g/dL   AST 54 (H) 15 - 41 U/L   ALT 32 0 - 44 U/L   Alkaline Phosphatase 103 38 - 126 U/L   Total Bilirubin 1.0 0.3 - 1.2 mg/dL   GFR, Estimated 55 (L) >60 mL/min    Comment: (NOTE) Calculated using the CKD-EPI Creatinine Equation (2021)    Anion gap 12 5 - 15    Comment: Performed at [facility], [address] [place], [place] [address]   No results found.  Pertinent items noted in HPI and remainder of  comprehensive ROS otherwise negative.  Blood pressure (!) 178/82, pulse 86, temperature 98.9 F (37.2 C), temperature source Oral, resp. rate 18, height 5\' 9"  (1.753 m), weight 76.2 kg, SpO2 100 %.  Patient is awake and alert.  He is oriented and appropriate.  His speech is fluent.  His judgment and insight are intact.  His cranial nerve function is normal bilateral.  His motor examination reveals mild weakness of his quadriceps muscle groups bilaterally.  He has some mild decrease strength in his dorsiflexors bilaterally.  Plantarflexion strength is normal.  Gait is unsteady.  Posture is mildly flexed.  Examination sensory function reveals patchy distal sensory loss in both lower extremities from L2 distally.  Examination head ears eyes nose and throat is unremarked.  Chest and abdomen are benign.  Extremities are free from injury or deformity.  Assessment/Plan L2-3, L3-4, L4-5 stenosis with neurogenic claudication.  Plan L2-3, L3-4, L4-5 decompressive laminectomy and foraminotomies.  Risks and benefits of been explained.  Patient wishes to proceed.  Kathaleen Maser Lowen Barringer 02/15/2022, 10:34 AM

## 2022-02-15 NOTE — Op Note (Signed)
Date of procedure: 02/15/2022 ? ?Date of dictation: Same ? ?Service: Neurosurgery ? ?Preoperative diagnosis: Lumbar stenosis with neurogenic claudication, L2-3, L3-4, L4-5 ? ?Postoperative diagnosis: Same ? ?Procedure Name: L2-3, L3-4, L4-5 decompressive laminectomies with foraminotomies ? ?Surgeon:Nikai Quest A.Jaeden Messer, M.D. ? ?Asst. Surgeon: Reinaldo Meeker, NP ? ?Anesthesia: General ? ?Indication: 71 year old male with symptomatic lumbar stenosis failing conservative management.  Work-up demonstrates evidence of significant spondylosis and stenosis at L2-3, L3-4 and L4-5.  Patient presents now for decompressive surgery in hopes of improving his symptoms. ? ?Operative note: After induction anesthesia, patient position prone onto Wilson frame and properly padded.  Lumbar region prepped and draped sterilely.  Incision made from L2-L5.  Dissection performed bilaterally.  Retractor placed.  X-ray performed.  Levels confirmed.  Decompressive laminectomy then performed using Leksell rongeur's and Kerrison rongeurs to remove the spinous processes of L2-L3 and the majority of L5 and L4.  Decompressive laminectomy was then performed using high-speed drill and Kerrison rongeurs to remove the inferior two thirds of the lamina of L2 the medial aspect the L2-3 facet joint the entire lamina of L3 and the medial aspect of the L3-4 facet joint and the entire lamina of L4 and the medial aspect the L4-5 facet joint and the superior aspect the L5 lamina.  Ligament flavum elevated and resected.  Underlying thecal sac identified.  Foraminotomies were then performed on the course exiting L2-L3-L4 and L5 nerve roots bilaterally.  At this point a very thorough decompression been achieved.  There was no evidence of injury to the thecal sac or nerve roots.  Wound is then irrigated 1 final time.  Hemostasis was assured.  Medium intact drain was left in the epidural space.  Gelfoam was placed over the thecal sac.  Wound is then closed in layers with Vicryl  sutures.  Steri-Strips and sterile dressing were applied.  No apparent complications.  Patient tolerated the procedure well and he returns to recovery room postop. ?

## 2022-02-15 NOTE — Anesthesia Procedure Notes (Signed)
Procedure Name: Intubation ?Date/Time: 02/15/2022 11:11 AM ?Performed by: Jenne Campus, CRNA ?Pre-anesthesia Checklist: Patient identified, Emergency Drugs available, Suction available and Patient being monitored ?Patient Re-evaluated:Patient Re-evaluated prior to induction ?Oxygen Delivery Method: Circle System Utilized ?Preoxygenation: Pre-oxygenation with 100% oxygen ?Induction Type: IV induction ?Ventilation: Mask ventilation without difficulty ?Laryngoscope Size: Sabra Heck and 3 ?Grade View: Grade I ?Tube type: Oral ?Tube size: 7.5 mm ?Number of attempts: 1 ?Airway Equipment and Method: Stylet ?Placement Confirmation: ETT inserted through vocal cords under direct vision, positive ETCO2 and breath sounds checked- equal and bilateral ?Secured at: 21 cm ?Tube secured with: Tape ?Dental Injury: Teeth and Oropharynx as per pre-operative assessment  ? ? ? ? ?

## 2022-02-15 NOTE — Brief Op Note (Signed)
02/15/2022 ? ?1:15 PM ? ?PATIENT:  David [name]  71 y.o. male ? ?PRE-OPERATIVE DIAGNOSIS:  Stenosis ? ?POST-OPERATIVE DIAGNOSIS:  Stenosis ? ?PROCEDURE:  Procedure(s) with comments: ?Laminectomy and Foraminotomy - Lumbar Two-Lumbar Three - Lumbar Three-Lumbar Four - Lumbar Four-Lumbar Five (N/A) - Laminectomy and Foraminotomy - Lumbar Two-Lumbar Three - Lumbar Three-Lumbar Four - Lumbar Four-Lumbar Five ? ?SURGEON:  Surgeon(s) and Role: ?   Earnie Larsson, MD - Primary ? ?PHYSICIAN ASSISTANT:  ? ?ASSISTANTS: Bergman,NP  ? ?ANESTHESIA:   general ? ?EBL:  250 mL  ? ?BLOOD ADMINISTERED:none ? ?DRAINS: (med) Hemovact drain(s) in the epidural space with  Suction Open  ? ?LOCAL MEDICATIONS USED:  MARCAINE    ? ?SPECIMEN:  No Specimen ? ?DISPOSITION OF SPECIMEN:  N/A ? ?COUNTS:  YES ? ?TOURNIQUET:  * No tourniquets in log * ? ?DICTATION: .Dragon Dictation ? ?PLAN OF CARE: Admit for overnight observation ? ?PATIENT DISPOSITION:  PACU - hemodynamically stable. ?  ?Delay start of Pharmacological VTE agent (>24hrs) due to surgical blood loss or risk of bleeding: yes ? ?

## 2022-02-15 NOTE — Transfer of Care (Signed)
Immediate Anesthesia Transfer of Care Note ? ?Patient: David [name] ? ?Procedure(s) Performed: Laminectomy and Foraminotomy - Lumbar Two-Lumbar Three - Lumbar Three-Lumbar Four - Lumbar Four-Lumbar Five (Back) ? ?Patient Location: PACU ? ?Anesthesia Type:General ? ?Level of Consciousness: awake, oriented and patient cooperative ? ?Airway & Oxygen Therapy: Patient Spontanous Breathing and Patient connected to nasal cannula oxygen ? ?Post-op Assessment: Report given to RN and Post -op Vital signs reviewed and stable ? ?Post vital signs: Reviewed ? ?Last Vitals:  ?Vitals Value Taken Time  ?BP 147/77 02/15/22 1337  ?Temp    ?Pulse 86 02/15/22 1338  ?Resp 13 02/15/22 1338  ?SpO2 100 % 02/15/22 1338  ?Vitals shown include unvalidated device data. ? ?Last Pain:  ?Vitals:  ? 02/15/22 0924  ?TempSrc:   ?PainSc: 6   ?   ? ?Patients Stated Pain Goal: 3 (02/15/22 3403) ? ?Complications: No notable events documented. ?

## 2022-02-16 ENCOUNTER — Encounter (HOSPITAL_COMMUNITY): Payer: Self-pay | Admitting: Neurosurgery

## 2022-02-16 DIAGNOSIS — I10 Essential (primary) hypertension: Secondary | ICD-10-CM | POA: Diagnosis not present

## 2022-02-16 DIAGNOSIS — M48062 Spinal stenosis, lumbar region with neurogenic claudication: Secondary | ICD-10-CM | POA: Diagnosis not present

## 2022-02-16 DIAGNOSIS — Z7984 Long term (current) use of oral hypoglycemic drugs: Secondary | ICD-10-CM | POA: Diagnosis not present

## 2022-02-16 DIAGNOSIS — I2510 Atherosclerotic heart disease of native coronary artery without angina pectoris: Secondary | ICD-10-CM | POA: Diagnosis not present

## 2022-02-16 DIAGNOSIS — Z955 Presence of coronary angioplasty implant and graft: Secondary | ICD-10-CM | POA: Diagnosis not present

## 2022-02-16 DIAGNOSIS — Z87891 Personal history of nicotine dependence: Secondary | ICD-10-CM | POA: Diagnosis not present

## 2022-02-16 DIAGNOSIS — Z79899 Other long term (current) drug therapy: Secondary | ICD-10-CM | POA: Diagnosis not present

## 2022-02-16 DIAGNOSIS — Z794 Long term (current) use of insulin: Secondary | ICD-10-CM | POA: Diagnosis not present

## 2022-02-16 DIAGNOSIS — E119 Type 2 diabetes mellitus without complications: Secondary | ICD-10-CM | POA: Diagnosis not present

## 2022-02-16 LAB — GLUCOSE, CAPILLARY: Glucose-Capillary: 172 mg/dL — ABNORMAL HIGH (ref 70–99)

## 2022-02-16 MED ORDER — HYDROCODONE-ACETAMINOPHEN 10-325 MG PO TABS
1.0000 | ORAL_TABLET | ORAL | 0 refills | Status: DC | PRN
Start: 2022-02-16 — End: 2022-04-12

## 2022-02-16 NOTE — Evaluation (Signed)
Occupational Therapy Evaluation ?Patient Details ?Name: David [name] ?MRN: [id number] ?DOB: October 06, 1951 ?Today's Date: 02/16/2022 ? ? ?History of Present Illness 71 yo male s/p L 2-3 L3-4 L4-5 decompressive laminectomy and foraminotomies PMH cirrhosis etoh related since 2019, adenomatous colon polyps arthritis, CAD cataract depression DM2 HLD HTN MI sleep apnea L knee arthroscopy  ? ?Clinical Impression ?  ?Patient is s/p L2-3 L3-4 L4-5  surgery resulting in functional limitations due to the deficits listed below (see OT problem list). Pt currently demonstrates R LE foot drop and  balance deficits. Pt with poor recall of precautions with return demo. Patient will benefit from skilled OT acutely to increase independence and safety with ADLS to allow discharge [place]. ?  ?   ? ?Recommendations for follow up therapy are one component of a multi-disciplinary discharge planning process, led by the attending physician.  Recommendations may be updated based on patient status, additional functional criteria and insurance authorization.  ? ?Follow Up Recommendations ? Home health OT  ?  ?Assistance Recommended at Discharge PRN  ?Patient can return home with the following   ? ?  ?Functional Status Assessment ? Patient has had a recent decline in their functional status and demonstrates the ability to make significant improvements in function in a reasonable and predictable amount of time.  ?Equipment Recommendations ? Other (comment) (requesting new "drive" RW due to current one being bent)  ?  ?Recommendations for Other Services   ? ? ?  ?Precautions / Restrictions Precautions ?Precautions: Back ?Precaution Comments: reviewed back precautions with handout ?Restrictions ?Weight Bearing Restrictions: No  ? ?  ? ?Mobility Bed Mobility ?Overal bed mobility: Modified Independent ?  ?  ?  ?  ?  ?  ?General bed mobility comments: mod cues for twisting during task. pt reports having a metal frame and using HOB at home. pt educated again  on lifting restriction ?  ? ?Transfers ?Overall transfer level: Needs assistance ?Equipment used: Rolling walker (2 wheels) ?Transfers: Sit to/from Stand ?Sit to Stand: Supervision ?  ?  ?  ?  ?  ?General transfer comment: pt using bil UE to push from surface ?  ? ?  ?Balance Overall balance assessment: Mild deficits observed, not formally tested, History of Falls ?  ?  ?  ?  ?  ?  ?  ?  ?  ?  ?  ?  ?  ?  ?  ?  ?  ?  ?   ? ?ADL either performed or assessed with clinical judgement  ? ?ADL Overall ADL's : Needs assistance/impaired ?Eating/Feeding: Modified independent ?Eating/Feeding Details (indicate cue type and reason): drinking from cup with cues to avoid reaching with L hand to twist ?  ?Grooming Details (indicate cue type and reason): educated on bathroom setup and reviewed in detail ?  ?  ?  ?  ?Upper Body Dressing : Supervision/safety ?  ?Lower Body Dressing: Supervision/safety ?Lower Body Dressing Details (indicate cue type and reason): mod cues for reacher and sock aide. Pt able to figure 4 cross but three times attempting to bend at waist. pt cued each time. with cues pt able to keep back precautions ?Toilet Transfer: Supervision/safety ?Toilet Transfer Details (indicate cue type and reason): uses 3n1 at home ?  ?Toileting - Clothing Manipulation Details (indicate cue type and reason): pt reports buying new urinal prior to surgery and has at home if needed ?  ?Tub/Shower Transfer Details (indicate cue type and reason): pt reports using bsc and shower seat  to complete transfer by sitting and sliding across ?Functional mobility during ADLs: Supervision/safety;Rolling walker (2 wheels) ?General ADL Comments: pt noted to have R foot drop  ? ? ? ?Vision Baseline Vision/History: 1 Wears glasses ?Patient Visual Report: Other (comment) (reading) ?   ?   ?Perception   ?  ?Praxis   ?  ? ?Pertinent Vitals/Pain Pain Assessment ?Pain Assessment: No/denies pain  ? ? ? ?Hand Dominance Right ?  ?Extremity/Trunk Assessment  Upper Extremity Assessment ?Upper Extremity Assessment: Overall WFL for tasks assessed ?  ?Lower Extremity Assessment ?Lower Extremity Assessment: RLE deficits/detail;LLE deficits/detail ?RLE Deficits / Details: appears to have foot drop. pt states "oh i have neuropathy and its stiff on the inside" as pt continues to lift foot during session states "thats so odd" ?LLE Deficits / Details: noted to have a square skin tear on L calf. Pt uncertain when and how the injury occurred. pt states "must have been a blister there"  "must have been my braces" "i havent seen that before" pt with dressing placed on open area and advised to leave in place as dressing is water proof ?  ?Cervical / Trunk Assessment ?Cervical / Trunk Assessment: Back Surgery ?  ?Communication Communication ?Communication: HOH ?  ?Cognition Arousal/Alertness: Awake/alert ?Behavior During Therapy: Impulsive ?Overall Cognitive Status: Impaired/Different from baseline ?Area of Impairment: Safety/judgement, Memory ?  ?  ?  ?  ?  ?  ?  ?  ?  ?  ?Memory: Decreased short-term memory, Decreased recall of precautions ?  ?Safety/Judgement: Decreased awareness of safety ?  ?  ?General Comments: pt with recall of 1 out 3 back precautions.he needs cues for safety and back precautions. pt bending and twisting while saying "no bending" or "i know i shouldnt bend" ?  ?  ?General Comments  incision covered, pt with cut on L calf area and dressed with bandage ? ?  ?Exercises   ?  ?Shoulder Instructions    ? ? ?Home Living Family/patient expects to be discharged to:: Private residence ?Living Arrangements: Other relatives (rooommate) ?Available Help at Discharge: Family ?Type of Home: Apartment ?Home Access: Level entry ?  ?  ?Home Layout: One level ?  ?  ?Bathroom Shower/Tub: Tub/shower unit;Curtain ?  ?Bathroom Toilet: Standard ?  ?  ?Home Equipment: Conservation officer, nature (2 wheels);Cane - single point;Shower seat;BSC/3in1;Grab bars - tub/shower;Hand held shower head;Adaptive  equipment ?Adaptive Equipment: Reacher;Sock aid ?Additional Comments: pt with previous surgery with RW and HH previously. pt reports "i know but okay go ahead" pt with poor recall and demo ?  ? ?  ?Prior Functioning/Environment Prior Level of Function : Driving ?  ?  ?  ?  ?  ?  ?Mobility Comments: walks with RW only uses cane in bedroom due to small space for RW to reach bathroom. ?ADLs Comments: uses reacher and sock aide baseline. pt drags the BSC from commode to edge of ?  ? ?  ?  ?OT Problem List: Decreased activity tolerance;Impaired balance (sitting and/or standing);Decreased safety awareness;Decreased knowledge of use of DME or AE;Decreased knowledge of precautions;Impaired sensation ?  ?   ?OT Treatment/Interventions: Self-care/ADL training;Energy conservation;Therapeutic exercise;DME and/or AE instruction;Therapeutic activities;Patient/family education;Balance training  ?  ?OT Goals(Current goals can be found in the care plan section) Acute Rehab OT Goals ?Patient Stated Goal: to get it over with - response to therapy ?OT Goal Formulation: With patient ?Time For Goal Achievement: 03/02/22 ?Potential to Achieve Goals: Good  ?OT Frequency: Min 2X/week ?  ? ?Co-evaluation   ?  ?  ?  ?  ? ?  ?  AM-PAC OT "6 Clicks" Daily Activity     ?Outcome Measure Help from another person eating meals?: None ?Help from another person taking care of personal grooming?: None ?Help from another person toileting, which includes using toliet, bedpan, or urinal?: A Little ?Help from another person bathing (including washing, rinsing, drying)?: A Little ?Help from another person to put on and taking off regular upper body clothing?: None ?Help from another person to put on and taking off regular lower body clothing?: A Little ?6 Click Score: 21 ?  ?End of Session Equipment Utilized During Treatment: Rolling walker (2 wheels) ?Nurse Communication: Mobility status;Precautions ? ?Activity Tolerance: Patient tolerated treatment  well ?Patient left: in chair;with call bell/phone within reach ? ?OT Visit Diagnosis: Unsteadiness on feet (R26.81);Muscle weakness (generalized) (M62.81)  ?              ?Time: [phone] ?OT Time Calculation

## 2022-02-16 NOTE — Evaluation (Signed)
Physical Therapy Evaluation and Discharge ?Patient Details ?Name: David [name] ?MRN: [id number] ?DOB: 04-24-1951 ?Today's Date: 02/16/2022 ? ?History of Present Illness ? Pt is a 71 y/o male who presents s/p L2-5 decompressive laminectomy and foraminotomies on 02/15/22. PMH cirrhosis etoh related since 2019, adenomatous colon polyps arthritis, CAD cataract depression DM2 HLD HTN MI sleep apnea L knee arthroscopy  ?Clinical Impression ? Patient evaluated by Physical Therapy with no further acute PT needs identified. All education has been completed and the patient has no further questions. Pt was able to demonstrate transfers and ambulation with gross supervision for safety and RW for support. Recommend outpatient PT follow up when appropriate per post-op protocol to address R foot drop if not improved. Pt was educated on precautions, brace application/wearing schedule, appropriate activity progression, and car transfer. See below for any follow-up Physical Therapy or equipment needs. PT is signing off. Thank you for this referral.    ?   ? ?Recommendations for follow up therapy are one component of a multi-disciplinary discharge planning process, led by the attending physician.  Recommendations may be updated based on patient status, additional functional criteria and insurance authorization. ? ?Follow Up Recommendations No PT follow up ? ?  ?Assistance Recommended at Discharge PRN  ?Patient can return home with the following ? A little help with walking and/or transfers;Assist for transportation;Assistance with cooking/housework ? ?  ?Equipment Recommendations Rolling walker (2 wheels)  ?Recommendations for Other Services ?    ?  ?Functional Status Assessment Patient has had a recent decline in their functional status and demonstrates the ability to make significant improvements in function in a reasonable and predictable amount of time.  ? ?  ?Precautions / Restrictions Precautions ?Precautions: Back ?Precaution  Booklet Issued: Yes (comment) ?Precaution Comments: Reviewed handout and pt was cued for precautions during functional mobility. ?Required Braces or Orthoses:  (No brace needed order) ?Restrictions ?Weight Bearing Restrictions: No  ? ?  ? ?Mobility ? Bed Mobility ?  ?  ?  ?  ?  ?  ?  ?General bed mobility comments: Pt was received sitting up in recliner. Verbally reviewed log roll technique. ?  ? ?Transfers ?Overall transfer level: Needs assistance ?Equipment used: Rolling walker (2 wheels) ?Transfers: Sit to/from Stand ?Sit to Stand: Supervision ?  ?  ?  ?  ?  ?General transfer comment: VC's for improved posture with power up to full stand. No assist required. ?  ? ?Ambulation/Gait ?Ambulation/Gait assistance: Supervision, Min guard ?Gait Distance (Feet): 475 Feet ?Assistive device: Rolling walker (2 wheels) ?Gait Pattern/deviations: Step-through pattern, Decreased stride length, Trunk flexed, Decreased dorsiflexion - right ?Gait velocity: Decreased ?Gait velocity interpretation: 1.31 - 2.62 ft/sec, indicative of limited community ambulator ?  ?General Gait Details: Mildly uncoordinated with foot drop noted on the R. Pt with poor maintenance of precautions and requires cues for safety. Min guard progressing to supervision for safety by end of gait training. ? ?Stairs ?  ?  ?  ?  ?  ? ?Wheelchair Mobility ?  ? ?Modified Rankin (Stroke Patients Only) ?  ? ?  ? ?Balance Overall balance assessment: Mild deficits observed, not formally tested, History of Falls ?  ?  ?  ?  ?  ?  ?  ?  ?  ?  ?  ?  ?  ?  ?  ?  ?  ?  ?   ? ? ? ?Pertinent Vitals/Pain Pain Assessment ?Pain Assessment: Faces ?Faces Pain Scale: Hurts a little bit ?Pain  Location: incision site ?Pain Descriptors / Indicators: Operative site guarding (itching) ?Pain Intervention(s): Limited activity within patient's tolerance, Monitored during session, Repositioned  ? ? ?Home Living Family/patient expects to be discharged to:: Private residence ?Living  Arrangements: Other relatives (roommate) ?Available Help at Discharge: Family ?Type of Home: Apartment ?Home Access: Level entry ?  ?  ?  ?Home Layout: One level ?Home Equipment: Conservation officer, nature (2 wheels);Cane - single point;Shower seat;BSC/3in1;Grab bars - tub/shower;Hand held shower head;Adaptive equipment ?Additional Comments: pt with previous surgery with RW and HH previously. pt reports "i know but okay go ahead" pt with poor recall and demo  ?  ?Prior Function Prior Level of Function : Driving ?  ?  ?  ?  ?  ?  ?Mobility Comments: walks with RW only uses cane in bedroom due to small space for RW to reach bathroom. ?ADLs Comments: uses reacher and sock aide baseline. pt drags the BSC from commode to edge of ?  ? ? ?Hand Dominance  ? Dominant Hand: Right ? ?  ?Extremity/Trunk Assessment  ? Upper Extremity Assessment ?Upper Extremity Assessment: Overall WFL for tasks assessed ?  ? ?Lower Extremity Assessment ?Lower Extremity Assessment: RLE deficits/detail;LLE deficits/detail ?RLE Deficits / Details: Notable foot drop. Pt initially states he has not noticed it however upon further contemplation, states he has been catching his toes on the rugs at home for the last couple months and has caused falls. ?RLE Sensation: history of peripheral neuropathy ?RLE Coordination: decreased gross motor ?LLE Deficits / Details: Pt reports baseline "issues" with LLE due to knee surgery and "problems from my back". Poor coordination noted however no foot drop. Mildly decreased strength. ?LLE Sensation: history of peripheral neuropathy ?LLE Coordination: decreased gross motor ?  ? ?Cervical / Trunk Assessment ?Cervical / Trunk Assessment: Back Surgery  ?Communication  ? Communication: HOH  ?Cognition Arousal/Alertness: Awake/alert ?Behavior During Therapy: Impulsive ?Overall Cognitive Status: Impaired/Different from baseline ?Area of Impairment: Safety/judgement, Memory ?  ?  ?  ?  ?  ?  ?  ?  ?  ?  ?Memory: Decreased short-term  memory, Decreased recall of precautions ?  ?Safety/Judgement: Decreased awareness of safety ?  ?  ?General Comments: poor recall of precautions throughout session ?  ?  ? ?  ?General Comments General comments (skin integrity, edema, etc.): incision covered, pt with cut on L calf area and dressed with bandage ? ?  ?Exercises    ? ?Assessment/Plan  ?  ?PT Assessment Patient does not need any further PT services  ?PT Problem List   ? ?   ?  ?PT Treatment Interventions     ? ?PT Goals (Current goals can be found in the Care Plan section)  ?Acute Rehab PT Goals ?Patient Stated Goal: Home today ?PT Goal Formulation: All assessment and education complete, DC therapy ?Time For Goal Achievement: 02/23/22 ?Potential to Achieve Goals: Good ? ?  ?Frequency   ?  ? ? ?Co-evaluation   ?  ?  ?  ?  ? ? ?  ?AM-PAC PT "6 Clicks" Mobility  ?Outcome Measure Help needed turning from your back to your side while in a flat bed without using bedrails?: None ?Help needed moving from lying on your back to sitting on the side of a flat bed without using bedrails?: None ?Help needed moving to and from a bed to a chair (including a wheelchair)?: A Little ?Help needed standing up from a chair using your arms (e.g., wheelchair or bedside chair)?: A Little ?  Help needed to walk in hospital room?: A Little ?Help needed climbing 3-5 steps with a railing? : A Little ?6 Click Score: 20 ? ?  ?End of Session   ?Activity Tolerance: Patient tolerated treatment well ?Patient left: in chair;with call bell/phone within reach ?Nurse Communication: Mobility status ?PT Visit Diagnosis: Unsteadiness on feet (R26.81);Pain ?Pain - part of body:  (back) ?  ? ?Time: 4695-0722 ?PT Time Calculation (min) (ACUTE ONLY): 22 min ? ? ?Charges:   PT Evaluation ?$PT Eval Low Complexity: 1 Low ?  ?  ?   ? ? ?Rolinda Roan, PT, DPT ?Acute Rehabilitation Services ?Pager: [phone] ?Office: [phone]  ? ?Thelma Comp ?02/16/2022, 10:36 AM ? ?

## 2022-02-16 NOTE — Progress Notes (Signed)
Patient discharge home per order. Discharge instructions given to patient with stated understanding of instructions given. Incision on back CDI. Patient has an appointment ?

## 2022-02-16 NOTE — Discharge Summary (Signed)
Physician Discharge Summary  ?Patient ID: ?David [name] ?MRN: [id number] ?DOB/AGE: 06-23-1951 71 y.o. ? ?Admit date: 02/15/2022 ?Discharge date: 02/16/2022 ? ?Admission Diagnoses: ? ?Discharge Diagnoses:  ?Principal Problem: ?  Lumbar stenosis with neurogenic claudication ? ? ?Discharged Condition: good ? ?Hospital Course: Patient admitted to the hospital where he underwent uncomplicated lumbar decompressive surgery.  Postoperative doing very well.  Preoperative back and lower extremity pain and gait unsteady improved.  Patient standing ambulating and voiding without difficulty.  Ready for discharge home. ? ?Consults:  ?Significant Diagnostic Studies:  ? ?Treatments:  ? ?Discharge Exam: ?Blood pressure 112/66, pulse 66, temperature (!) 97.4 ?F (36.3 ?C), temperature source Oral, resp. rate 18, height '5\' 9"'$  (1.753 m), weight 76.2 kg, SpO2 100 %. ? ?Patient is awake and alert.  He is oriented and appropriate.  Speech is fluent.  Judgment insight are intact.  Cranial nerve function normal lateral motor and sensory function stable.  Wound clean dry.  Chest and abdomen benign. ?Disposition: Discharge disposition: 01-Home or Self Care ? ? ? ? ? ? ? ?Allergies as of 02/16/2022   ?No Known Allergies ?  ? ?  ?Medication List  ?  ? ?TAKE these medications   ? ?colchicine 0.6 MG tablet ?Take 1 tablet (0.6 mg total) by mouth daily. ?  ?ferrous sulfate 325 (65 FE) MG tablet ?Take 1 tablet (325 mg total) by mouth daily with breakfast. ?  ?fluticasone 50 MCG/ACT nasal spray ?Commonly known as: FLONASE ?Place 1 spray into both nostrils daily. ?  ?FreeStyle Libre 2 Reader Kerrin Mo ?1 each by Does not apply route daily. ?  ?FreeStyle Libre 2 Sensor Misc ?1 each by Does not apply route every 14 (fourteen) days. ?  ?furosemide 40 MG tablet ?Commonly known as: LASIX ?Take 1 tablet (40 mg total) by mouth daily. ?  ?glucose blood test strip ?Commonly known as: ONE TOUCH ULTRA TEST ?Use to test blood sugar 2 times daily as instructed. Dx: E11.59,  E11.65 ?  ?HumaLOG Junior KwikPen 100 UNIT/ML ?Generic drug: Insulin lispro ?Inject 6-8 Units into the skin 3 (three) times daily before meals. Only with a big meal ?  ?HYDROcodone-acetaminophen 5-325 MG tablet ?Commonly known as: NORCO/VICODIN ?Take 1 tablet by mouth every 6 (six) hours as needed for moderate pain or severe pain. ?What changed: Another medication with the same name was added. Make sure you understand how and when to take each. ?  ?HYDROcodone-acetaminophen 10-325 MG tablet ?Commonly known as: NORCO ?Take 1-2 tablets by mouth every 4 (four) hours as needed for severe pain ((score 7 to 10)). ?What changed: You were already taking a medication with the same name, and this prescription was added. Make sure you understand how and when to take each. ?  ?Insulin Pen Needle 32G X 4 MM Misc ?Use 4x a day - with insulin ?  ?Lantus SoloStar 100 UNIT/ML Solostar Pen ?Generic drug: insulin glargine ?Inject 26 Units into the skin daily. ?  ?LIVER SUPPORT SL ?Place 1 capsule under the tongue 2 (two) times daily. ?  ?metFORMIN 1000 MG tablet ?Commonly known as: GLUCOPHAGE ?Take 1 tablet (1,000 mg total) by mouth daily with supper. ?What changed: when to take this ?  ?multivitamin with minerals Tabs tablet ?Take 1 tablet by mouth at bedtime. One a day ?  ?OneTouch Delica Lancets Fine Misc ?Use to test blood sugar 2 times daily as instructed. Dx: E11.59, E11.65 ?  ?OPTIVE 0.5-0.9 % ophthalmic solution ?Generic drug: carboxymethylcellul-glycerin ?Place 1 drop into both eyes daily  as needed for dry eyes. ?  ?pantoprazole 40 MG tablet ?Commonly known as: PROTONIX ?Take 1 tablet (40 mg total) by mouth daily. ?  ?PARoxetine 40 MG tablet ?Commonly known as: PAXIL ?Take 1 tablet (40 mg total) by mouth every morning. ?  ?propranolol 80 MG tablet ?Commonly known as: INDERAL ?Take 80 mg by mouth 2 (two) times daily. ?  ?rosuvastatin 10 MG tablet ?Commonly known as: CRESTOR ?Take 1 tablet (10 mg total) by mouth daily. ?   ?spironolactone 100 MG tablet ?Commonly known as: ALDACTONE ?Take 1 tablet (100 mg total) by mouth daily. ?  ?tamsulosin 0.4 MG Caps capsule ?Commonly known as: FLOMAX ?Take 1 capsule (0.4 mg total) by mouth daily. ?  ?triamcinolone cream 0.1 % ?Commonly known as: KENALOG ?APPLY TOPICALLY TO SCALP TWO TIMES DAILY AS NEEDED ?What changed:  ?how much to take ?when to take this ?reasons to take this ?additional instructions ?  ? ?  ? ? ? ?Signed: ?Cooper Render Enes Wegener ?02/16/2022, 9:32 AM ? ? ?

## 2022-02-16 NOTE — Discharge Instructions (Addendum)
Wound Care Keep incision covered and dry for three days.   Do not put any creams, lotions, or ointments on incision. Leave steri-strips on back.  They will fall off by themselves. Activity Walk each and every day, increasing distance each day. No lifting greater than 5 lbs.  Avoid excessive neck motion. No driving for 2 weeks; may ride as a passenger locally. Diet Resume your normal diet.   Call Your Doctor If Any of These Occur Redness, drainage, or swelling at the wound.  Temperature greater than 101 degrees. Severe pain not relieved by pain medication. Incision starts to come apart. Follow Up Appt Call today for appointment in 1-2 weeks ([phone]) or for problems.  If you have any hardware placed in your spine, you will need an x-ray before your appointment.  

## 2022-03-12 ENCOUNTER — Encounter: Payer: Self-pay | Admitting: Internal Medicine

## 2022-03-12 ENCOUNTER — Ambulatory Visit: Payer: Medicare Other | Admitting: Internal Medicine

## 2022-03-12 VITALS — BP 130/80 | HR 99 | Ht 69.0 in | Wt 159.6 lb

## 2022-03-12 DIAGNOSIS — E663 Overweight: Secondary | ICD-10-CM | POA: Diagnosis not present

## 2022-03-12 DIAGNOSIS — E785 Hyperlipidemia, unspecified: Secondary | ICD-10-CM

## 2022-03-12 DIAGNOSIS — E1159 Type 2 diabetes mellitus with other circulatory complications: Secondary | ICD-10-CM

## 2022-03-12 DIAGNOSIS — E1165 Type 2 diabetes mellitus with hyperglycemia: Secondary | ICD-10-CM | POA: Diagnosis not present

## 2022-03-12 LAB — POCT GLYCOSYLATED HEMOGLOBIN (HGB A1C): Hemoglobin A1C: 6.2 % — AB (ref 4.0–5.6)

## 2022-03-12 NOTE — Progress Notes (Signed)
Patient ID: David [name], male   DOB: 02/10/1951, 71 y.o.   MRN: [id number]  ? ?This visit occurred during the SARS-CoV-2 public health emergency.  Safety protocols were in place, including screening questions prior to the visit, additional usage of staff PPE, and extensive cleaning of exam room while observing appropriate contact time as indicated for disinfecting solutions.  ? ?HPI: ?David [name] is a 71 y.o.-year-old male, returning for f/u for DM2, dx in ~2008, insulin-dependent since 09/2015, uncontrolled, with complications (CAD - s/p stents in 2013, PN). Last visit 1 month ago ? ?Interim history: ?No blurry vision, nausea, chest pain.  He recently lost  9 pounds after increased Lasix and spironolactone by Dr. Ardis Hughs. ?At last visit, significant leg weakness and had laminectomy and foraminotomy of L2-L5 on 02/15/2022.  Glucose increased to 380 afterwards. Still has back pain. ?He had no severe hypoglycemic episodes or hypoglycemic seizures since last visit. ?He continues to fall.  He has a walker, but he needs to change it because the legs are slanted. ? ?Reviewed HbA1c levels: ?Lab Results  ?Component Value Date  ? HGBA1C 5.9 (H) 01/21/2022  ? HGBA1C 5.8 (A) 11/17/2021  ? HGBA1C 6.0 (A) 08/21/2021  ?03/05/2016: HbA1c 9.1% ? ?He is on: ?- Metformin 1000 mg 2x a day, with meals ?-  NPH 30 units in a.m. and 35 units at night >> Lantus 34 >> 26 units in a.m. ?- Humalog 8-12 >> 16 >> 14-20 >> 16-20 >> 14-16 (he did not decrease the dose to 8-10 units as advised at last visit) >> 6 to 8 units along with a larger meal ?He was on Ozempic from 01/2019 to 08/2019 but had to come off due to price.   ?He was on Bydureon 2 mg weekly - started 01/30/2017 >> stopped 04/2017 b/c price - could not restart ? ?Pt checks his sugars more than 4 times a day with his CGM: ? ? ?Previously: ? ? ?Lowest sugar was 25 (was active that day), OTW 40-50s >> 30s >> 40 >> 96; it is unclear at which level he has hypoglycemia awareness. ?He had  severe hypoglycemic episodes before last visit: 23 on regular labs (07/2021), and 25 (10/2021).   ?He also had hypoglycemic seizures with a blood sugar in the 30s (11/21/2021) and 38 (drank alcohol and only ate a salad after taking the full dose of insulin, 12/26/2021) ?In 12/2021, he fell when going to the restroom at night.  Blood sugar was 40 at that time. ?Highest sugar was 215 >> 184 >> 350 >> 300s. ? ?Glucometer: One Touch ? ?Pt's meals are: ?- Breakfast: protein shake ?- Lunch: n/a ?- Dinner: meat + veggies /potatoes or salad ?- Snacks: no ?Diet sodas, ice tea with splenda.  No regular sodas. ? ?-+ CKD, last BUN/creatinine:  ?Lab Results  ?Component Value Date  ? BUN 17 02/15/2022  ? CREATININE 1.36 (H) 02/15/2022  ?On losartan. ? ?-+ HL; last set of lipids: ?Lab Results  ?Component Value Date  ? CHOL 135 07/14/2021  ? HDL 56.40 07/14/2021  ? [place] 64 07/14/2021  ? LDLDIRECT 103.9 11/05/2013  ? TRIG 76.0 07/14/2021  ? CHOLHDL 2 07/14/2021  ?On Crestor 10. ? ?- last eye exam was in 03/2021: No DR reportedly.  Has previous history of cataract surgery. ? ?-he has numbness and tingling in his feet. UTD with foot exam (12/2021). ? ?He also has HTN, depression.  He has a history of anabolic steroid use for muscle building. ?He had a cardiac  cath 08/2018 for presyncope: Stable coronary anatomy without high-grade coronary stenosis.  He is on medical therapy for CAD. ?He was found to be anemic (Hb 8.1) >> EGD report reviewed:  ?- Non-bleeding grade II esophageal varices. ?- Non-bleeding gastric ulcers with a clean ulcer base (Forrest Class III). Biopsied. ?- Erythematous duodenopathy. ?- Normal second portion of the duodenum. ?In 2021: He and his wife of 40 years separated and he had a difficult time living outside his home. ?In 2021 he was also diagnosed with cirrhosis.  He does have a history of heavy alcohol use and he continues to use alcohol.  He has anemia and decreased platelets. ?He saw a chiropractor for his  neuropathy.  He was using infrared light and felt that this was helping.  ? ?ROS: ?+ see HPI ?Neurological: + tremors/+ numbness/+ tingling/no dizziness ? ?I reviewed pt's medications, allergies, PMH, social hx, family hx, and changes were documented in the history of present illness. Otherwise, unchanged from my initial visit note. ?  ?Past Medical History:  ?Diagnosis Date  ? Allergy   ? Anemia   ? Arthritis   ? Bilateral hands and feet  ? CAD (coronary artery disease)   ? s/p bifurcation stenting (DES x2) and stenting bifurcation lesion distal RCA (1 DES)  ? Cataract   ? Cirrhosis ([place])   ? Depression   ? Diabetes mellitus   ? Phylliss Blakes - type 2  ? Esophageal varices (H[facility])   ? GERD (gastroesophageal reflux disease)   ? Hyperlipemia   ? Hypertension   ? Myocardial infarction [facility])   ? Sleep apnea   ? denies wearing CPAP  ? ?Past Surgical History:  ?Procedure Laterality Date  ? BIOPSY  09/01/2018  ? Procedure: BIOPSY;  Surgeon: Mauri Pole, MD;  Location: [facility] ENDOSCOPY;  Service: Endoscopy;;  ? CATARACT EXTRACTION Bilateral   ? CORONARY ANGIOPLASTY WITH STENT PLACEMENT   March 2010  ? Dr. Olevia Perches  ? ESOPHAGOGASTRODUODENOSCOPY (EGD) WITH PROPOFOL N/A 09/01/2018  ? Procedure: ESOPHAGOGASTRODUODENOSCOPY (EGD) WITH PROPOFOL;  Surgeon: Mauri Pole, MD;  Location: [facility] ENDOSCOPY;  Service: Endoscopy;  Laterality: N/A;  ? KNEE ARTHROSCOPY Left 09/07/2021  ? Procedure: IRRIGATION AND DEBRIDEMENT OF LEFT KNEE; ARTHROSCOPY OF LEFT KNEE, PARTIAL MEDIAL [place].;  Surgeon: [name], MD;  Location: [facility] ORS;  Service: Orthopedics;  Laterality: Left;  ? LEFT HEART CATH AND CORONARY ANGIOGRAPHY N/A 08/31/2018  ? Procedure: LEFT HEART CATH AND CORONARY ANGIOGRAPHY;  Surgeon: Sherren Mocha, MD;  Location: [facility] CV LAB;  Service: Cardiovascular;  Laterality: N/A;  ? LUMBAR LAMINECTOMY/DECOMPRESSION MICRODISCECTOMY N/A 02/15/2022  ? Procedure: Laminectomy and Foraminotomy - Lumbar Two-Lumbar Three -  Lumbar Three-Lumbar Four - Lumbar Four-Lumbar Five;  Surgeon: Earnie Larsson, MD;  Location: [facility];  Service: Neurosurgery;  Laterality: N/A;  Laminectomy and Foraminotomy - Lumbar Two-Lumbar Three - Lumbar Three-Lumbar Four - Lumbar Four-Lumbar Five  ? THORACIC DISCECTOMY Right 06/19/2021  ? Procedure: Microdiscectomy - right - Thoracic Eleven-Thoracic Twelve;  Surgeon: Earnie Larsson, MD;  Location: [facility];  Service: Neurosurgery;  Laterality: Right;  ? TONSILLECTOMY  1959  ? ?Social History  ? ?Social History  ? Marital Status: Married  ?  Spouse Name: N/A  ? Number of Children: 2  ? ?Occupational History  ? Selling storage buildings   ?  site closed  ? Retail data collection   ? ?Social History Main Topics  ? Smoking status: Former Smoker  ?  Types: Cigarettes  ?  Quit date: 11/30/1983  ?  Smokeless tobacco: Never Used  ? Alcohol Use:  beer, 3-4 times a week, 6-8 drinks at the time   ?    ? Drug Use: No  ? ?Social History Narrative  ? No living will  ? Would want wife, then son [name], as health care POA  ? Would accept resuscitation  ? Not sure about tube feeds  ? ?Current Outpatient Medications on File Prior to Visit  ?Medication Sig Dispense Refill  ? carboxymethylcellul-glycerin (OPTIVE) 0.5-0.9 % ophthalmic solution Place 1 drop into both eyes daily as needed for dry eyes.    ? colchicine 0.6 MG tablet Take 1 tablet (0.6 mg total) by mouth daily. 30 tablet 0  ? Continuous Blood Gluc Receiver (FREESTYLE LIBRE 2 READER) DEVI 1 each by Does not apply route daily. 1 each 0  ? Continuous Blood Gluc Sensor (FREESTYLE LIBRE 2 SENSOR) MISC 1 each by Does not apply route every 14 (fourteen) days. 6 each 3  ? ferrous sulfate 325 (65 FE) MG tablet Take 1 tablet (325 mg total) by mouth daily with breakfast. 30 tablet 0  ? fluticasone (FLONASE) 50 MCG/ACT nasal spray Place 1 spray into both nostrils daily.    ? furosemide (LASIX) 40 MG tablet Take 1 tablet (40 mg total) by mouth daily. 30 tablet 3  ? glucose blood (ONE TOUCH ULTRA  TEST) test strip Use to test blood sugar 2 times daily as instructed. Dx: E11.59, E11.65 100 each 5  ? Homeopathic Products (LIVER SUPPORT SL) Place 1 capsule under the tongue 2 (two) times daily.    ? H

## 2022-03-12 NOTE — Patient Instructions (Addendum)
Please change: - Metformin 1000 mg 2x a day, with meals - Lantus 26 units in am  - Humalog 6-8 units 15 min before EVERY meal.  If you forget to take Humalog before dinner, take 4-5 units only after dinner   Please return in 3 months. 

## 2022-03-12 NOTE — Addendum Note (Signed)
Addended by: Lauralyn Primes on: 03/12/2022 02:23 PM ? ? Modules accepted: Orders ? ?

## 2022-03-18 ENCOUNTER — Telehealth: Payer: Medicare Other

## 2022-03-18 ENCOUNTER — Telehealth: Payer: Self-pay

## 2022-03-18 NOTE — Progress Notes (Signed)
? ? ?Chronic Care Management ?Pharmacy Assistant  ? ?Name: David [name]  MRN: [id number] DOB: 04/30/1951 ? ?Reason for Encounter: CCM Counsellor) ?  ?Medications: ?Outpatient Encounter Medications as of 03/18/2022  ?Medication Sig Note  ? carboxymethylcellul-glycerin (OPTIVE) 0.5-0.9 % ophthalmic solution Place 1 drop into both eyes daily as needed for dry eyes.   ? colchicine 0.6 MG tablet Take 1 tablet (0.6 mg total) by mouth daily.   ? Continuous Blood Gluc Receiver (FREESTYLE LIBRE 2 READER) DEVI 1 each by Does not apply route daily.   ? Continuous Blood Gluc Sensor (FREESTYLE LIBRE 2 SENSOR) MISC 1 each by Does not apply route every 14 (fourteen) days.   ? ferrous sulfate 325 (65 FE) MG tablet Take 1 tablet (325 mg total) by mouth daily with breakfast.   ? fluticasone (FLONASE) 50 MCG/ACT nasal spray Place 1 spray into both nostrils daily.   ? furosemide (LASIX) 40 MG tablet Take 1 tablet (40 mg total) by mouth daily.   ? Homeopathic Products (LIVER SUPPORT SL) Place 1 capsule under the tongue 2 (two) times daily.   ? HYDROcodone-acetaminophen (NORCO) 10-325 MG tablet Take 1-2 tablets by mouth every 4 (four) hours as needed for severe pain ((score 7 to 10)).   ? HYDROcodone-acetaminophen (NORCO/VICODIN) 5-325 MG tablet Take 1 tablet by mouth every 6 (six) hours as needed for moderate pain or severe pain.   ? insulin glargine (LANTUS SOLOSTAR) 100 UNIT/ML Solostar Pen Inject 26 Units into the skin daily. 02/15/2022: 13 units ?  ? Insulin lispro (HUMALOG JUNIOR KWIKPEN) 100 UNIT/ML Inject 6-8 Units into the skin 3 (three) times daily before meals. Only with a big meal   ? Insulin Pen Needle 32G X 4 MM MISC Use 4x a day - with insulin   ? metFORMIN (GLUCOPHAGE) 1000 MG tablet Take 1 tablet (1,000 mg total) by mouth daily with supper. (Patient taking differently: Take 1,000 mg by mouth 2 (two) times daily with a meal.)   ? Multiple Vitamin (MULTIVITAMIN WITH MINERALS) TABS tablet Take 1 tablet by mouth at  bedtime. One a day   ? ONETOUCH DELICA LANCETS FINE MISC Use to test blood sugar 2 times daily as instructed. Dx: E11.59, E11.65   ? pantoprazole (PROTONIX) 40 MG tablet Take 1 tablet (40 mg total) by mouth daily.   ? PARoxetine (PAXIL) 40 MG tablet Take 1 tablet (40 mg total) by mouth every morning.   ? propranolol (INDERAL) 80 MG tablet Take 80 mg by mouth 2 (two) times daily.   ? rosuvastatin (CRESTOR) 10 MG tablet Take 1 tablet (10 mg total) by mouth daily.   ? spironolactone (ALDACTONE) 100 MG tablet Take 1 tablet (100 mg total) by mouth daily.   ? tamsulosin (FLOMAX) 0.4 MG CAPS capsule Take 1 capsule (0.4 mg total) by mouth daily.   ? triamcinolone cream (KENALOG) 0.1 % APPLY TOPICALLY TO SCALP TWO TIMES DAILY AS NEEDED (Patient taking differently: 1 application. daily as needed (Psoriasis).)   ? ?No facility-administered encounter medications on file as of 03/18/2022.  ? ?Markos Theil was contacted to remind of upcoming telephone visit with Charlene Brooke on 03/22/2022 at 11:00. Patient was reminded to have any blood glucose and blood pressure readings available for review at appointment.  ? ?Message was left reminding patient of appointment. ? ? ?Star Rating Drugs: ?Medication:  Last Fill: Day Supply ?Metformin 1000 mg 11/24/2021 90   ?Rosuvastatin 10 mg 11/11/2021 90 ?Fill dates verified with [facility] ? ?Charlene Brooke,  CPP notified ? ?Marijean Niemann, RMA ?Clinical Pharmacy Assistant ?[phone] ? ? ? ? ? ?

## 2022-03-23 ENCOUNTER — Telehealth: Payer: Self-pay | Admitting: Pharmacist

## 2022-03-23 ENCOUNTER — Ambulatory Visit (INDEPENDENT_AMBULATORY_CARE_PROVIDER_SITE_OTHER): Payer: Medicare Other | Admitting: Pharmacist

## 2022-03-23 DIAGNOSIS — E785 Hyperlipidemia, unspecified: Secondary | ICD-10-CM

## 2022-03-23 DIAGNOSIS — K7030 Alcoholic cirrhosis of liver without ascites: Secondary | ICD-10-CM

## 2022-03-23 DIAGNOSIS — F331 Major depressive disorder, recurrent, moderate: Secondary | ICD-10-CM

## 2022-03-23 DIAGNOSIS — E119 Type 2 diabetes mellitus without complications: Secondary | ICD-10-CM

## 2022-03-23 DIAGNOSIS — I10 Essential (primary) hypertension: Secondary | ICD-10-CM

## 2022-03-23 NOTE — Progress Notes (Signed)
? ?Chronic Care Management ?Pharmacy Note ? ?03/28/2022 ?Name:  Edwardo Wojnarowski MRN:  [id number] DOB:  1951/08/20 ? ?Summary: CCM F/U visit ?-Pt is using Freestyle Libre 2 to monitor sugars, range 110-190 currently ?-Pt did receive PAP forms for Ozempic but did not fill them out or bring to endocrine; pt would likely benefit from switching Humalog to Ozempic given lower risk for hypoglycemia ? ?Recommendations/Changes made from today's visit: ?-Pursue PAP for Ozempic - pt to complete and bring to endocrine office ? ?Plan: ?-[place] will follow up in 1 month for Ozempic PAP ?-Pharmacist follow up televisit scheduled for 3 months ?-PCP visit 04/12/22 ? ? ? ?Subjective: ?Lelend Heinecke is an 71 y.o. year old male who is a primary patient of Letvak, Theophilus Kinds, MD.  The CCM team was consulted for assistance with disease management and care coordination needs.   ? ?Engaged with patient by telephone for follow up visit in response to provider referral for pharmacy case management and/or care coordination services.  ? ?Consent to Services:  ?The patient was given information about Chronic Care Management services, agreed to services, and gave verbal consent prior to initiation of services.  Please see initial visit note for detailed documentation.  ? ?Patient Care Team: ?[name], MD as PCP - General ?Sherren Mocha, MD as PCP - Cardiology (Cardiology) ?Heidy Mccubbin, Cleaster Corin, [facility] as Pharmacist (Pharmacist) ? ?Recent office visits: ?01/12/22 Dr Silvio Pate OV: AWV - DM foot exam done. Rx'd tamsulosin 0.4 mg daily. ?10/16/21 - Dr. Silvio Pate, PCP - Stop lantus. Use humalog only if your sugars are too high. Continue the novolin N twice a day and monitor your sugars. Also d/c'd losartan. ? ?05/22/21 - Dr. Silvio Pate, PCP - Pt presented for actinic keratoses. Treated with liquid nitrogen in office. If recurrence, excision at dermatologist. ?  ?Recent consult visits: ?03/12/22 Dr Cruzita Lederer (Endocrine): f/u DM. A1c 6.2%; Advised to  take Humalog 15 min before every meal.  ? ?02/09/22 Dr Ardis Hughs (GI): f/u cirrhosis. Reduce pantoprazole to daily. Refilled Rx. ? ?02/02/22 NP Laurann Montana (Cardiology): preop exam (back surgery); no changes ? ?01/28/22 Dr Cruzita Lederer (Endocrine): f/u DM. Reduce Lantus 32 > 26 units daily due to lows. ? ?12/31/21 Dr Cruzita Lederer (endocrine): f/u DM. Change NPH to Lantus. D/C Ozempic ? ?12/18/2021 - Gastroenterology - Pt presented for decompensated cirrhosis. He continues to drink alcohol every other day. Recommend reduction. Due to some ascites, increase Lasix 40 mg daily and increase aldactone to 100 mg daily. ? ?11/24/21 Endocrine pt message: change metformin to 1000 mg once daily, reduce NPH to 20 units AM, 15 units HS, and reduce Humalog to 6-8 units ? ?11/17/2021 - Dr Cruzita Lederer (Endocrinology) -  Reduce Humalog to 8-10 units before meals. Restart Ozempic. Order CGM monitor to pharmacy. Follow up 1 1/2 months. ?  ?Hospital visits: ?02/15/22-02/16/2022 - admission for lumbar surgery ?11/21/21 - ED visit, hypoglycemia  ?09/05/21-09/10/21: admission for septic arthritis. D/C'd on IV cefazolin x 2 wks. ? ?Objective: ? ?Lab Results  ?Component Value Date  ? CREATININE 1.36 (H) 02/15/2022  ? BUN 17 02/15/2022  ? GFR 75.91 02/05/2022  ? GFRNONAA 55 (L) 02/15/2022  ? GFRAA 64.14 09/22/2021  ? NA 137 02/15/2022  ? K 4.1 02/15/2022  ? CALCIUM 9.3 02/15/2022  ? CO2 22 02/15/2022  ? GLUCOSE 127 (H) 02/15/2022  ? ? ?Lab Results  ?Component Value Date/Time  ? HGBA1C 6.2 (A) 03/12/2022 02:23 PM  ? HGBA1C 5.9 (H) 01/21/2022 02:33 PM  ? HGBA1C 5.8 (A)  11/17/2021 01:44 PM  ? HGBA1C 6.8 (H) 05/21/2021 08:25 AM  ? GFR 75.91 02/05/2022 02:45 PM  ? GFR 59.70 (L) 01/01/2022 01:21 PM  ? MICROALBUR 2.0 (H) 07/14/2021 11:40 AM  ? MICROALBUR 9.1 (H) 02/11/2010 10:11 AM  ?  ?Last diabetic Eye exam:  ?Lab Results  ?Component Value Date/Time  ? HMDIABEYEEXA No Retinopathy 12/24/2019 12:00 AM  ?  ?Last diabetic Foot exam: 09/08/20 podiatry  ? ?Lab Results   ?Component Value Date  ? CHOL 135 07/14/2021  ? HDL 56.40 07/14/2021  ? [place] 64 07/14/2021  ? LDLDIRECT 103.9 11/05/2013  ? TRIG 76.0 07/14/2021  ? CHOLHDL 2 07/14/2021  ? ? ? ?  Latest Ref Rng & Units 02/15/2022  ?  9:04 AM 01/21/2022  ?  2:33 PM 12/26/2021  ?  8:15 AM  ?Hepatic Function  ?Total Protein 6.5 - 8.1 g/dL 7.2   7.2   7.8    ?Albumin 3.5 - 5.0 g/dL 3.3   3.2   3.4    ?AST 15 - 41 U/L 54   63   87    ?ALT 0 - 44 U/L 32   52   40    ?Alk Phosphatase 38 - 126 U/L 103   126   118    ?Total Bilirubin 0.3 - 1.2 mg/dL 1.0   0.8   0.9    ? ? ?Lab Results  ?Component Value Date/Time  ? TSH 4.496 12/26/2021 08:15 AM  ? TSH 1.81 11/05/2013 12:49 PM  ? TSH 0.90 02/16/2011 11:01 AM  ? FREET4 0.87 05/16/2015 09:41 AM  ? FREET4 0.7 02/05/2009 04:40 PM  ? ? ? ?  Latest Ref Rng & Units 02/15/2022  ?  9:04 AM 12/26/2021  ?  8:15 AM 12/18/2021  ?  2:48 PM  ?CBC  ?WBC 4.0 - 10.5 K/uL 4.7   6.9   5.8    ?Hemoglobin 13.0 - 17.0 g/dL 9.1   10.1   10.0    ?Hematocrit 39.0 - 52.0 % 28.1   30.5   30.5    ?Platelets 150 - 400 K/uL 115   122   125.0    ? ? ?No results found for: VD25OH ? ?Clinical ASCVD: Yes  ?The 10-year ASCVD risk score (Arnett DK, et al., 2019) is: 37.2% ?  Values used to calculate the score: ?    Age: 71 years ?    Sex: Male ?    Is Non-Hispanic African American: No ?    Diabetic: Yes ?    Tobacco smoker: No ?    Systolic Blood Pressure: 924 mmHg ?    Is BP treated: Yes ?    HDL Cholesterol: 56.4 mg/dL ?    Total Cholesterol: 135 mg/dL   ? ? ?Social History  ? ?Tobacco Use  ?Smoking Status Former  ? Types: Cigarettes  ? Quit date: 11/30/1983  ? Years since quitting: 38.3  ? Passive exposure: Never  ?Smokeless Tobacco Never  ? ?BP Readings from Last 3 Encounters:  ?03/12/22 130/80  ?02/16/22 112/66  ?02/09/22 120/68  ? ?Pulse Readings from Last 3 Encounters:  ?03/12/22 99  ?02/16/22 66  ?02/09/22 86  ? ?Wt Readings from Last 3 Encounters:  ?03/12/22 159 lb 9.6 oz (72.4 kg)  ?02/15/22 168 lb (76.2 kg)  ?02/09/22 166  lb (75.3 kg)  ? ?BMI Readings from Last 3 Encounters:  ?03/12/22 23.57 kg/m?  ?02/15/22 24.81 kg/m?  ?02/09/22 24.51 kg/m?  ? ? ?Assessment/Interventions: Review of  patient past medical history, allergies, medications, health status, including review of consultants reports, laboratory and other test data, was performed as part of comprehensive evaluation and provision of chronic care management services.  ? ?SDOH:  (Social Determinants of Health) assessments and interventions performed: Yes ? ? ?SDOH Screenings  ? ?Alcohol Screen: Not on file  ?Depression (PHQ2-9): Not on file  ?Financial Resource Strain: Medium Risk  ? Difficulty of Paying Living Expenses: Somewhat hard  ?Food Insecurity: No Food Insecurity  ? Worried About Charity fundraiser in the Last Year: Never true  ? Ran Out of Food in the Last Year: Never true  ?Housing: Not on file  ?Physical Activity: Not on file  ?Social Connections: Not on file  ?Stress: Not on file  ?Tobacco Use: Medium Risk  ? Smoking Tobacco Use: Former  ? Smokeless Tobacco Use: Never  ? Passive Exposure: Never  ?Transportation Needs: Not on file  ? ? ?[place] ? ?No Known Allergies ? ? ?Medications Reviewed Today   ? ? Reviewed by Charlton Haws, [facility] (Pharmacist) on 03/28/22 at 1333  Med List Status: <None>  ? ?Medication Order Taking? Sig Documenting Provider Last Dose Status Informant  ?carboxymethylcellul-glycerin (OPTIVE) 0.5-0.9 % ophthalmic solution [id number] Yes Place 1 drop into both eyes daily as needed for dry eyes. [provider] Taking Active Self  ?colchicine 0.6 MG tablet [id number] Yes Take 1 tablet (0.6 mg total) by mouth daily. Medina-Vargas, Monina C, NP Taking Active Self  ?         ?Med Note Rolan Bucco Dec 24, 2021  4:30 PM)    ?Continuous Blood Gluc Receiver (FREESTYLE LIBRE 2 READER) DEVI [id number] Yes 1 each by Does not apply route daily. Philemon Kingdom, MD Taking Active Self  ?Continuous Blood Gluc Sensor (FREESTYLE  LIBRE 2 SENSOR) MISC [id number] Yes 1 each by Does not apply route every 14 (fourteen) days. Philemon Kingdom, MD Taking Active Self  ?ferrous sulfate 325 (65 FE) MG tablet [id number] Yes Take 1 tablet (325 mg to

## 2022-03-23 NOTE — Telephone Encounter (Signed)
?  Chronic Care Management  ? ?Outreach Note ? ?03/23/2022 ?Name: Djibril Glogowski MRN: [id number] DOB: 05-26-1951 ? ?Referred by: [name], MD ? ?Patient had a phone appointment scheduled with clinical pharmacist today. ? ?An unsuccessful telephone outreach was attempted today. The patient was referred to the pharmacist for assistance with medications, care management and care coordination.  ? ?Patient will NOT be penalized in any way for missing a CCM appointment. The no-show fee does not apply. ? ?If possible, a message was left to return call to: [phone] or to [facility]. ? ?Charlene Brooke, PharmD, BCACP ?Clinical Pharmacist ?[facility] ?[phone] ? ? ?

## 2022-03-24 ENCOUNTER — Telehealth: Payer: Self-pay

## 2022-03-24 NOTE — Progress Notes (Signed)
? ? ?  Chronic Care Management ?Pharmacy Assistant  ? ?Name: David [name]  MRN: [id number] DOB: July 09, 1951 ? ?Reason for Encounter: CCM (Reschedule Appointment) ?  ?Left patient a voicemail asking him to return my call to reschedule his missed telephone appointment with Charlene Brooke from 03/23/2022 at 11:00. ? ?Charlene Brooke, CPP notified ? ?Marijean Niemann, RMA ?Clinical Pharmacy Assistant ?[phone] ? ? ? ?

## 2022-03-28 ENCOUNTER — Encounter: Payer: Self-pay | Admitting: Gastroenterology

## 2022-03-28 DIAGNOSIS — E785 Hyperlipidemia, unspecified: Secondary | ICD-10-CM

## 2022-03-28 DIAGNOSIS — E1159 Type 2 diabetes mellitus with other circulatory complications: Secondary | ICD-10-CM

## 2022-03-28 DIAGNOSIS — Z794 Long term (current) use of insulin: Secondary | ICD-10-CM

## 2022-03-28 DIAGNOSIS — F32A Depression, unspecified: Secondary | ICD-10-CM

## 2022-03-28 DIAGNOSIS — I10 Essential (primary) hypertension: Secondary | ICD-10-CM

## 2022-03-28 NOTE — Patient Instructions (Signed)
Visit Information ? ?Phone number for Pharmacist: [phone] ? ? Goals Addressed   ? ?  ?  ?  ?  ? This Visit's Progress  ?  Monitor and Manage My Blood Sugar-Diabetes Type 2     ?  Timeframe:  Long-Range Goal ?Priority:  Medium ?Start Date:      12/24/21                       ?Expected End Date:   12/24/22                   ? ?Follow Up Date July 2023 ?  ?- check blood sugar at prescribed times ?- check blood sugar if I feel it is too high or too low ?- take the blood sugar meter to all doctor visits  ?  ?Why is this important?   ?Checking your blood sugar at home helps to keep it from getting very high or very low.  ?Writing the results in a diary or log helps the doctor know how to care for you.  ?Your blood sugar log should have the time, date and the results.  ?Also, write down the amount of insulin or other medicine that you take.  ?Other information, like what you ate, exercise done and how you were feeling, will also be helpful.   ?  ?Notes:  ?  ? ?  ? ? ?Care Plan : [place]  ?Updates made by Charlton Haws, [place] since 03/28/2022 12:00 AM  ?  ? ?Problem: Hypertension, Hyperlipidemia, Diabetes, and Depression, Cirrhosis   ?  ? ?Long-Range Goal: Disease Management   ?Start Date: 06/08/2021  ?Expected End Date: 12/24/2022  ?This Visit's Progress: On track  ?Recent Progress: On track  ?Priority: High  ?Note:   ?Current Barriers:  ?Unable to independently afford treatment regimen ?Unable to independently monitor therapeutic efficacy ? ?Pharmacist Clinical Goal(s):  ?Patient will verbalize ability to afford treatment regimen ?achieve adherence to monitoring guidelines and medication adherence to achieve therapeutic efficacy through collaboration with PharmD and provider.  ? ?Interventions: ?1:1 collaboration with [name], MD regarding development and update of comprehensive plan of care as evidenced by provider attestation and co-signature ?Inter-disciplinary care team collaboration (see  longitudinal plan of care) ?Comprehensive medication review performed; medication list updated in electronic medical record ? ?Hypertension (BP goal <140/90) ?-Controlled - BP is at goal during office visits; given comorbid cirrhosis diuretic regimen and beta blocker below are appropriate ?-Current treatment: ?Spironolactone 100 mg daily - Appropriate, Effective, Safe, Accessible ?Furosemide 40 mg daily -Appropriate, Effective, Safe, Accessible ?Propranolol 80 mg BID -Appropriate, Effective, Safe, Accessible ?-Medications previously tried: losartan, metoprolol ?-Current home readings: none, he plans to purchase a home BP monitor ?-Denies hypotensive/hypertensive symptoms ?-Educated on Importance of home blood pressure monitoring; ?-Counseled to monitor BP at home periodically ?-Recommended to continue current medication ? ?Diabetes (A1c goal <7%) ?-Controlled - A1c 6.2% (02/2022); he is now using CGM to monitor sugrs ?-Managed by endocrinology (Dr Cruzita Lederer) ?-Current home glucose readings: range 110-190; scanning 4-5x daily ?-Current medications: ?Metformin 1000 mg BID w/ dinner - Appropriate, Effective, Safe, Accessible ?Lantus 26 units daily - Appropriate, Effective, Safe, Accessible ?Humalog - Inject 6-8 units before meals -Appropriate, Effective, Query Safe, Accessible ?Freestyle Libre 2  ?-Medications previously tried: Research officer, trade union (cost) ?-Educated on A1c and blood sugar goals; Prevention and management of hypoglycemic episodes ?-Pt would benefit from GLP-1 RA, it may be able to replace prandial insulin  and reduce risk for hypoglycemia, however cost is a barrier; pt was emailed PAP forms for Ozempic previously, he reports he has received these but has not worked on them or brought them to endocrine to complete ?-Advised to complete Ozempic forms and bring to Dr Cruzita Lederer to complete ? ?Hyperlipidemia: (LDL goal < 70) ?-Controlled - LDL 64 (06/2021); pt endorses compliance with statin ?-Hx CAD ?-Current  treatment: ?Rosuvastatin 10 mg daily - Appropriate, Effective, Safe, Accessible ?-Medications previously tried: n/a  ?-Educated on Cholesterol goals; Benefits of statin for ASCVD risk reduction; ?-Recommended to continue current medication ? 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 ?Depression (Goal: manage symptoms) ?-Controlled - per pt report ?-PHQ, GAD-7 not on file ?-Current treatment: ?Paroxetine 40 mg daily - Appropriate, Effective, Safe, Accessible ?-Medications previously tried/failed: venlafaxine ?-Educated on Benefits of medication for symptom control ?-Will need to be cautious with changing SSRI/SNRI in future - several are not able to be used in cirrhosis (particularly duloxetine - avoid in cirrhosis) ?-Recommended to continue current medication ? ?Health Maintenance ?-Tamsulosin for BPH ?-Pantoprazole for GERD ?-Colchicine 0.6 mg daily for gout prevention ? ? ?Patient Goals/Self-Care Activities ?Patient will:  ?- take medications as prescribed as evidenced by patient report and record review ?focus on medication adherence by routine ?check glucose daily, document, and provide at future appointments ?check blood pressure periodically, document, and provide at future appointments ?collaborate with provider on medication access solutions (Ozempic) ? ?  ?  ? ?Patient verbalizes understanding of instructions and care plan provided today and agrees to view in [place]. Active  MyChart status confirmed with patient.   ?Telephone follow up appointment with pharmacy team member scheduled for: 3 months ? ?Charlene Brooke, PharmD, BCACP ?Clinical Pharmacist ?[facility] ?[phone] ?  ?

## 2022-03-29 ENCOUNTER — Other Ambulatory Visit: Payer: Self-pay

## 2022-03-29 DIAGNOSIS — K7030 Alcoholic cirrhosis of liver without ascites: Secondary | ICD-10-CM

## 2022-03-29 NOTE — Telephone Encounter (Signed)
Sorry its hard to see he mentioned it in the subject- spironolactone.   ?

## 2022-03-31 ENCOUNTER — Telehealth: Payer: Self-pay | Admitting: Internal Medicine

## 2022-03-31 NOTE — Telephone Encounter (Signed)
Patient came in to drop off [employer] Patient Media planner for [employer] for Dr. Cruzita Lederer to complete. Once the application is completed, Patient requests the application be sent to [employer]. ? ?[employer] Patient Media planner for Ozempic has been placed in Dr. Arman Filter mail box at front desk. ?

## 2022-03-31 NOTE — Telephone Encounter (Signed)
Placed on providers desk to be signed

## 2022-04-06 ENCOUNTER — Telehealth: Payer: Self-pay

## 2022-04-06 NOTE — Progress Notes (Signed)
Entered in error

## 2022-04-12 ENCOUNTER — Encounter: Payer: Self-pay | Admitting: Internal Medicine

## 2022-04-12 ENCOUNTER — Ambulatory Visit (INDEPENDENT_AMBULATORY_CARE_PROVIDER_SITE_OTHER): Payer: Medicare Other | Admitting: Internal Medicine

## 2022-04-12 VITALS — BP 114/62 | HR 89 | Temp 97.8°F | Ht 69.0 in | Wt 171.0 lb

## 2022-04-12 DIAGNOSIS — F1020 Alcohol dependence, uncomplicated: Secondary | ICD-10-CM | POA: Diagnosis not present

## 2022-04-12 DIAGNOSIS — Z794 Long term (current) use of insulin: Secondary | ICD-10-CM

## 2022-04-12 DIAGNOSIS — E119 Type 2 diabetes mellitus without complications: Secondary | ICD-10-CM | POA: Diagnosis not present

## 2022-04-12 DIAGNOSIS — M4804 Spinal stenosis, thoracic region: Secondary | ICD-10-CM | POA: Diagnosis not present

## 2022-04-12 DIAGNOSIS — K7030 Alcoholic cirrhosis of liver without ascites: Secondary | ICD-10-CM

## 2022-04-12 DIAGNOSIS — G992 Myelopathy in diseases classified elsewhere: Secondary | ICD-10-CM

## 2022-04-12 NOTE — Assessment & Plan Note (Signed)
Had lumbar decompression but still having pain and disability ? ?

## 2022-04-12 NOTE — Assessment & Plan Note (Signed)
Urged him to cut down ?If he stopped, could consider naltrexone ?

## 2022-04-12 NOTE — Progress Notes (Signed)
? ?Subjective:  ? ? Patient ID: David [name], male    DOB: January 02, 1951, 71 y.o.   MRN: [id number] ? ?HPI ?Here for follow up of multiple medical issues ? ?Still has pain from back surgery--lumbar decompressive surgery ?Having trouble doing his walking ?Uses hydrocodone '5mg'$ ---taking two a day usually ?Is driving---very quick shopping ?Mostly does take out for food ? ?Has cut back on alcohol---only weekends ?6-18 beers in the 2 days ? ?Weight is stable ?No fluid accumulation that he can tell ?No abdominal distention ? ?Still with frequent nocturia despite the tamsulosin ? ?Diabetes control still good ?Uses the Freestyle Libre--checks multiple times (usually 150-170) ?No hypoglycemic reactions ? ?Current Outpatient Medications on File Prior to Visit  ?Medication Sig Dispense Refill  ? carboxymethylcellul-glycerin (OPTIVE) 0.5-0.9 % ophthalmic solution Place 1 drop into both eyes daily as needed for dry eyes.    ? colchicine 0.6 MG tablet Take 1 tablet (0.6 mg total) by mouth daily. 30 tablet 0  ? Continuous Blood Gluc Receiver (FREESTYLE LIBRE 2 READER) DEVI 1 each by Does not apply route daily. 1 each 0  ? Continuous Blood Gluc Sensor (FREESTYLE LIBRE 2 SENSOR) MISC 1 each by Does not apply route every 14 (fourteen) days. 6 each 3  ? ferrous sulfate 325 (65 FE) MG tablet Take 1 tablet (325 mg total) by mouth daily with breakfast. 30 tablet 0  ? fluticasone (FLONASE) 50 MCG/ACT nasal spray Place 1 spray into both nostrils daily.    ? furosemide (LASIX) 40 MG tablet Take 1 tablet (40 mg total) by mouth daily. 30 tablet 3  ? Homeopathic Products (LIVER SUPPORT SL) Place 1 capsule under the tongue 2 (two) times daily.    ? HYDROcodone-acetaminophen (NORCO/VICODIN) 5-325 MG tablet Take 1 tablet by mouth every 6 (six) hours as needed for moderate pain or severe pain.    ? insulin glargine (LANTUS SOLOSTAR) 100 UNIT/ML Solostar Pen Inject 26 Units into the skin daily. 30 mL 3  ? Insulin lispro (HUMALOG JUNIOR KWIKPEN) 100  UNIT/ML Inject 6-8 Units into the skin 3 (three) times daily before meals. Only with a big meal 15 mL 3  ? Insulin Pen Needle 32G X 4 MM MISC Use 4x a day - with insulin 200 each 3  ? metFORMIN (GLUCOPHAGE) 1000 MG tablet Take 1 tablet (1,000 mg total) by mouth daily with supper. (Patient taking differently: Take 1,000 mg by mouth 2 (two) times daily with a meal.) 90 tablet 3  ? Multiple Vitamin (MULTIVITAMIN WITH MINERALS) TABS tablet Take 1 tablet by mouth at bedtime. One a day    ? ONETOUCH DELICA LANCETS FINE MISC Use to test blood sugar 2 times daily as instructed. Dx: E11.59, E11.65 100 each 5  ? pantoprazole (PROTONIX) 40 MG tablet Take 1 tablet (40 mg total) by mouth daily. 30 tablet 11  ? PARoxetine (PAXIL) 40 MG tablet Take 1 tablet (40 mg total) by mouth every morning. 30 tablet 0  ? propranolol (INDERAL) 80 MG tablet Take 80 mg by mouth 2 (two) times daily.    ? rosuvastatin (CRESTOR) 10 MG tablet Take 1 tablet (10 mg total) by mouth daily. 30 tablet 0  ? spironolactone (ALDACTONE) 100 MG tablet Take 1 tablet (100 mg total) by mouth daily. 30 tablet 3  ? tamsulosin (FLOMAX) 0.4 MG CAPS capsule Take 1 capsule (0.4 mg total) by mouth daily. 90 capsule 3  ? triamcinolone cream (KENALOG) 0.1 % APPLY TOPICALLY TO SCALP TWO TIMES DAILY AS NEEDED (Patient  taking differently: 1 application. daily as needed (Psoriasis).) 30 g 0  ? ?No current facility-administered medications on file prior to visit.  ? ? ?No Known Allergies ? ?Past Medical History:  ?Diagnosis Date  ? Allergy   ? Anemia   ? Arthritis   ? Bilateral hands and feet  ? CAD (coronary artery disease)   ? s/p bifurcation stenting (DES x2) and stenting bifurcation lesion distal RCA (1 DES)  ? Cataract   ? Cirrhosis ([place])   ? Depression   ? Diabetes mellitus   ? Phylliss Blakes - type 2  ? Esophageal varices (HCC)   ? GERD (gastroesophageal reflux disease)   ? Hyperlipemia   ? Hypertension   ? Myocardial infarction [facility])   ? Sleep apnea   ? denies wearing  CPAP  ? ? ?Past Surgical History:  ?Procedure Laterality Date  ? BIOPSY  09/01/2018  ? Procedure: BIOPSY;  Surgeon: Mauri Pole, MD;  Location: [facility] ENDOSCOPY;  Service: Endoscopy;;  ? CATARACT EXTRACTION Bilateral   ? CORONARY ANGIOPLASTY WITH STENT PLACEMENT   March 2010  ? Dr. Olevia Perches  ? ESOPHAGOGASTRODUODENOSCOPY (EGD) WITH PROPOFOL N/A 09/01/2018  ? Procedure: ESOPHAGOGASTRODUODENOSCOPY (EGD) WITH PROPOFOL;  Surgeon: Mauri Pole, MD;  Location: [place] ENDOSCOPY;  Service: Endoscopy;  Laterality: N/A;  ? KNEE ARTHROSCOPY Left 09/07/2021  ? Procedure: IRRIGATION AND DEBRIDEMENT OF LEFT KNEE; ARTHROSCOPY OF LEFT KNEE, PARTIAL MEDIAL [place].;  Surgeon: [name], MD;  Location: [facility] ORS;  Service: Orthopedics;  Laterality: Left;  ? LEFT HEART CATH AND CORONARY ANGIOGRAPHY N/A 08/31/2018  ? Procedure: LEFT HEART CATH AND CORONARY ANGIOGRAPHY;  Surgeon: Sherren Mocha, MD;  Location: [place] CV LAB;  Service: Cardiovascular;  Laterality: N/A;  ? LUMBAR LAMINECTOMY/DECOMPRESSION MICRODISCECTOMY N/A 02/15/2022  ? Procedure: Laminectomy and Foraminotomy - Lumbar Two-Lumbar Three - Lumbar Three-Lumbar Four - Lumbar Four-Lumbar Five;  Surgeon: Earnie Larsson, MD;  Location: [place];  Service: Neurosurgery;  Laterality: N/A;  Laminectomy and Foraminotomy - Lumbar Two-Lumbar Three - Lumbar Three-Lumbar Four - Lumbar Four-Lumbar Five  ? THORACIC DISCECTOMY Right 06/19/2021  ? Procedure: Microdiscectomy - right - Thoracic Eleven-Thoracic Twelve;  Surgeon: Earnie Larsson, MD;  Location: [place];  Service: Neurosurgery;  Laterality: Right;  ? TONSILLECTOMY  1959  ? ? ?Family History  ?Problem Relation Age of Onset  ? Heart failure Father   ?     Deceased 31 y/o  ? Diabetes Father   ? Cancer Father   ?     Prostate  ? Heart failure Mother   ?     Deceased 26 y/o  ? Alcohol abuse Mother   ? Schizophrenia Son   ?     Paranoid  ? Uveitis Son   ? Cancer Brother   ?     prostate  ? Colon cancer Neg Hx   ? Esophageal cancer  Neg Hx   ? Stomach cancer Neg Hx   ? Liver cancer Neg Hx   ? Pancreatic cancer Neg Hx   ? Rectal cancer Neg Hx   ? ? ?Social History  ? ?Socioeconomic History  ? Marital status: Divorced  ?  Spouse name: Not on file  ? Number of children: 2  ? Years of education: Not on file  ? Highest education level: Not on file  ?Occupational History  ? Occupation: Patent examiner buildings  ?  Comment: site closed  ? Occupation: Engineering geologist  ?  Comment: part time  ?  Comment: Part time  work  ?Tobacco Use  ? Smoking status: Former  ?  Types: Cigarettes  ?  Quit date: 11/30/1983  ?  Years since quitting: 38.3  ?  Passive exposure: Never  ? Smokeless tobacco: Never  ?Vaping Use  ? Vaping Use: Never used  ?Substance and Sexual Activity  ? Alcohol use: Yes  ?  Alcohol/week: 24.0 standard drinks  ?  Types: 24 Cans of beer per week  ?  Comment: per pt 10-15 beers/week (as of 06/18/21)  ? Drug use: No  ? Sexual activity: Not Currently  ?Other Topics Concern  ? Not on file  ?Social History Narrative  ? No living will  ? Would want want son [name], as health care POA  ? Would accept resuscitation  ? Not sure about tube feeds--but doesn't want if cognitively unaware  ? ?Social Determinants of Health  ? ?Financial Resource Strain: Medium Risk  ? Difficulty of Paying Living Expenses: Somewhat hard  ?Food Insecurity: No Food Insecurity  ? Worried About Charity fundraiser in the Last Year: Never true  ? Ran Out of Food in the Last Year: Never true  ?Transportation Needs: Not on file  ?Physical Activity: Not on file  ?Stress: Not on file  ?Social Connections: Not on file  ?Intimate Partner Violence: Not on file  ? ?Review of Systems ?Not sleeping well---not really new ?Gets out of bed 10AM---and goes to bed 11-12 at night ?Discussed sleep compression ?   ?Objective:  ? Physical Exam ?Constitutional:   ?   Appearance: Normal appearance.  ?Cardiovascular:  ?   Rate and Rhythm: Normal rate and regular rhythm.  ?   Heart sounds: No murmur  heard. ?  No gallop.  ?Pulmonary:  ?   Effort: Pulmonary effort is normal.  ?   Breath sounds: Normal breath sounds.  ?Abdominal:  ?   General: There is no distension.  ?   Palpations: Abdomen is soft.  ?

## 2022-04-12 NOTE — Assessment & Plan Note (Signed)
Lab Results  ?Component Value Date  ? HGBA1C 6.2 (A) 03/12/2022  ? ?Good control without hypoglycemia ?lantus 26, humalog 6-8 units before large meals, metformin 1000 bid ?

## 2022-04-12 NOTE — Assessment & Plan Note (Signed)
No fluid accumulation ?Still drinking but recent labs okay ?Urged him to further reduce his alcohol (beer) ?

## 2022-04-19 ENCOUNTER — Other Ambulatory Visit: Payer: Self-pay | Admitting: Adult Health

## 2022-04-19 DIAGNOSIS — E785 Hyperlipidemia, unspecified: Secondary | ICD-10-CM

## 2022-04-30 ENCOUNTER — Encounter: Payer: Self-pay | Admitting: Internal Medicine

## 2022-04-30 DIAGNOSIS — H52223 Regular astigmatism, bilateral: Secondary | ICD-10-CM | POA: Diagnosis not present

## 2022-04-30 DIAGNOSIS — Z9841 Cataract extraction status, right eye: Secondary | ICD-10-CM | POA: Diagnosis not present

## 2022-04-30 DIAGNOSIS — Z9842 Cataract extraction status, left eye: Secondary | ICD-10-CM | POA: Diagnosis not present

## 2022-04-30 DIAGNOSIS — E119 Type 2 diabetes mellitus without complications: Secondary | ICD-10-CM | POA: Diagnosis not present

## 2022-04-30 DIAGNOSIS — H0288B Meibomian gland dysfunction left eye, upper and lower eyelids: Secondary | ICD-10-CM | POA: Diagnosis not present

## 2022-05-01 ENCOUNTER — Other Ambulatory Visit: Payer: Self-pay | Admitting: Adult Health

## 2022-05-01 DIAGNOSIS — E785 Hyperlipidemia, unspecified: Secondary | ICD-10-CM

## 2022-05-03 ENCOUNTER — Other Ambulatory Visit: Payer: Self-pay | Admitting: Family

## 2022-05-03 MED ORDER — TADALAFIL 10 MG PO TABS: 10.0000 mg | ORAL_TABLET | ORAL | 3 refills | Status: DC | PRN

## 2022-05-04 ENCOUNTER — Other Ambulatory Visit: Payer: Self-pay | Admitting: *Deleted

## 2022-05-04 ENCOUNTER — Other Ambulatory Visit: Payer: Self-pay | Admitting: Internal Medicine

## 2022-05-04 DIAGNOSIS — E785 Hyperlipidemia, unspecified: Secondary | ICD-10-CM

## 2022-05-04 MED ORDER — ROSUVASTATIN CALCIUM 10 MG PO TABS: 10.0000 mg | ORAL_TABLET | Freq: Every day | ORAL | 3 refills | Status: AC

## 2022-05-20 ENCOUNTER — Other Ambulatory Visit: Payer: Self-pay | Admitting: Gastroenterology

## 2022-05-20 DIAGNOSIS — K7030 Alcoholic cirrhosis of liver without ascites: Secondary | ICD-10-CM

## 2022-05-31 ENCOUNTER — Telehealth: Payer: Self-pay | Admitting: Gastroenterology

## 2022-05-31 ENCOUNTER — Other Ambulatory Visit: Payer: Self-pay

## 2022-05-31 ENCOUNTER — Encounter: Payer: Self-pay | Admitting: Gastroenterology

## 2022-05-31 DIAGNOSIS — K7030 Alcoholic cirrhosis of liver without ascites: Secondary | ICD-10-CM

## 2022-05-31 NOTE — Telephone Encounter (Signed)
Patient aware that he is scheduled for RUQ abd ultrasound on 06-09-22 at 9am.   He was also advised of follow up appointment on 08-10-22 with Dr Ardis Hughs and that labs should be completed prior to the appointment.  Patient agreed to plan and verbalized understanding.  No further questions.

## 2022-05-31 NOTE — Telephone Encounter (Signed)
Inbound call from patient returning call. Please give the patient a call back to advise.  Thank you.

## 2022-06-09 ENCOUNTER — Ambulatory Visit (HOSPITAL_COMMUNITY)
Admission: RE | Admit: 2022-06-09 | Discharge: 2022-06-09 | Disposition: A | Discharge status: Home / Self Care | Payer: Medicare Other | Source: Ambulatory Visit | Attending: Gastroenterology | Admitting: Gastroenterology

## 2022-06-09 DIAGNOSIS — K7030 Alcoholic cirrhosis of liver without ascites: Secondary | ICD-10-CM | POA: Diagnosis present

## 2022-06-09 DIAGNOSIS — K7460 Unspecified cirrhosis of liver: Secondary | ICD-10-CM | POA: Diagnosis not present

## 2022-06-09 DIAGNOSIS — R188 Other ascites: Secondary | ICD-10-CM | POA: Diagnosis not present

## 2022-06-15 ENCOUNTER — Other Ambulatory Visit: Payer: Self-pay | Admitting: Internal Medicine

## 2022-06-15 ENCOUNTER — Encounter: Payer: Self-pay | Admitting: Internal Medicine

## 2022-06-15 ENCOUNTER — Ambulatory Visit: Payer: Medicare Other | Admitting: Internal Medicine

## 2022-06-15 VITALS — BP 138/90 | HR 103 | Ht 69.0 in | Wt 173.0 lb

## 2022-06-15 DIAGNOSIS — E1159 Type 2 diabetes mellitus with other circulatory complications: Secondary | ICD-10-CM

## 2022-06-15 DIAGNOSIS — E785 Hyperlipidemia, unspecified: Secondary | ICD-10-CM

## 2022-06-15 DIAGNOSIS — E1165 Type 2 diabetes mellitus with hyperglycemia: Secondary | ICD-10-CM | POA: Diagnosis not present

## 2022-06-15 DIAGNOSIS — E663 Overweight: Secondary | ICD-10-CM | POA: Diagnosis not present

## 2022-06-15 LAB — POCT GLYCOSYLATED HEMOGLOBIN (HGB A1C): Hemoglobin A1C: 6.2 % — AB (ref 4.0–5.6)

## 2022-06-15 NOTE — Progress Notes (Signed)
Patient ID: David [name], male   DOB: 1951-02-15, 71 y.o.   MRN: [id number]   This visit occurred during the SARS-CoV-2 public health emergency.  Safety protocols were in place, including screening questions prior to the visit, additional usage of staff PPE, and extensive cleaning of exam room while observing appropriate contact time as indicated for disinfecting solutions.   HPI: David [name] is a 71 y.o.-year-old male, returning for f/u for DM2, dx in ~2008, insulin-dependent since 09/2015, uncontrolled, with complications (CAD - s/p stents in 2013, PN). Last visit 1 month ago  Interim history: No blurry vision, nausea, chest pain.   At last visit, significant leg weakness and had laminectomy and foraminotomy of L2-L5 on 02/15/2022.  He continues to have back pain. He had no severe hypoglycemic episodes or hypoglycemic seizures since last visit. He walks with a walker due to the propensity for falls. No falls in last month.  Reviewed HbA1c levels: Lab Results  Component Value Date   HGBA1C 6.2 (A) 03/12/2022   HGBA1C 5.9 (H) 01/21/2022   HGBA1C 5.8 (A) 11/17/2021  03/05/2016: HbA1c 9.1%  He is on: - Metformin 1000 mg 2x a day, with meals -  NPH 30 units in a.m. and 35 units at night >> Lantus 34 >> 26 units in a.m. - Humalog 8-12 >> 16 >> 14-20 >> 16-20 >> 14-16 >> 6 to 8 units before meals (forgot: using it only for large meals - 1-2x a week) He was on Ozempic from 01/2019 to 08/2019 but had to come off due to price.   He was on Bydureon 2 mg weekly - started 01/30/2017 >> stopped 04/2017 b/c price - could not restart  Pt checks his sugars more than 4 times a day with his CGM:   Previously:   Previously:   Lowest sugar was 25 (was active that day), OTW 40-50s >> 30s >> 40 >> 96 >> 90; it is unclear at which level he has hypoglycemia awareness. He had severe hypoglycemic episodes before last visit: 23 on regular labs (07/2021), and 25 (10/2021).   He also had hypoglycemic  seizures with a blood sugar in the 30s (11/21/2021) and 38 (drank alcohol and only ate a salad after taking the full dose of insulin, 12/26/2021) In 12/2021, he fell when going to the restroom at night.  Blood sugar was 40 at that time. Highest sugar was 350 >> 300s >> 390.  Glucometer: One Touch  Pt's meals are: - Breakfast: protein shake - Lunch: n/a - Dinner: meat + veggies /potatoes or salad - Snacks: no Diet sodas, ice tea with splenda.  No regular sodas.  -+ CKD, last BUN/creatinine:  Lab Results  Component Value Date   BUN 17 02/15/2022   CREATININE 1.36 (H) 02/15/2022  On losartan.  -+ HL; last set of lipids: Lab Results  Component Value Date   CHOL 135 07/14/2021   HDL 56.40 07/14/2021   LDLCALC 64 07/14/2021   LDLDIRECT 103.9 11/05/2013   TRIG 76.0 07/14/2021   CHOLHDL 2 07/14/2021  On Crestor 10.  - last eye exam was in 03/2022: No DR reportedly.  Has previous history of cataract surgery.  -he has numbness and tingling in his feet. UTD with foot exam (12/2021).  He also has HTN, depression.  He has a history of anabolic steroid use for muscle building. He had a cardiac cath 08/2018 for presyncope: Stable coronary anatomy without high-grade coronary stenosis.  He is on medical therapy for CAD. He was found to  be anemic (Hb 8.1) >> EGD report reviewed:  - Non-bleeding grade II esophageal varices. - Non-bleeding gastric ulcers with a clean ulcer base (Forrest Class III). Biopsied. - Erythematous duodenopathy. - Normal second portion of the duodenum. In 2021: He and his wife of 40 years separated and he had a difficult time living outside his home. In 2021 he was also diagnosed with cirrhosis.  He does have a history of heavy alcohol use and he continues to use alcohol.  He has anemia and decreased platelets. He saw a chiropractor for his neuropathy.  He was using infrared light and felt that this was helping.   ROS: + see HPI Neurological: + tremors/+ numbness/+  tingling/no dizziness  I reviewed pt's medications, allergies, PMH, social hx, family hx, and changes were documented in the history of present illness. Otherwise, unchanged from my initial visit note.   Past Medical History:  Diagnosis Date   Allergy    Anemia    Arthritis    Bilateral hands and feet   CAD (coronary artery disease)    s/p bifurcation stenting (DES x2) and stenting bifurcation lesion distal RCA (1 DES)   Cataract    Cirrhosis (HCC)    Depression    Diabetes mellitus    Phylliss Blakes - type 2   Esophageal varices (HCC)    GERD (gastroesophageal reflux disease)    Hyperlipemia    Hypertension    Myocardial infarction [facility])    Sleep apnea    denies wearing CPAP   Past Surgical History:  Procedure Laterality Date   BIOPSY  09/01/2018   Procedure: BIOPSY;  Surgeon: Mauri Pole, MD;  Location: [facility] ENDOSCOPY;  Service: Endoscopy;;   CATARACT EXTRACTION Bilateral    CORONARY ANGIOPLASTY WITH STENT PLACEMENT   March 2010   Dr. Olevia Perches   ESOPHAGOGASTRODUODENOSCOPY (EGD) WITH PROPOFOL N/A 09/01/2018   Procedure: ESOPHAGOGASTRODUODENOSCOPY (EGD) WITH PROPOFOL;  Surgeon: Mauri Pole, MD;  Location: [place] ENDOSCOPY;  Service: Endoscopy;  Laterality: N/A;   KNEE ARTHROSCOPY Left 09/07/2021   Procedure: IRRIGATION AND DEBRIDEMENT OF LEFT KNEE; ARTHROSCOPY OF LEFT KNEE, PARTIAL MEDIAL [place].;  Surgeon: [name], MD;  Location: [facility] ORS;  Service: Orthopedics;  Laterality: Left;   LEFT HEART CATH AND CORONARY ANGIOGRAPHY N/A 08/31/2018   Procedure: LEFT HEART CATH AND CORONARY ANGIOGRAPHY;  Surgeon: Sherren Mocha, MD;  Location: [place] CV LAB;  Service: Cardiovascular;  Laterality: N/A;   LUMBAR LAMINECTOMY/DECOMPRESSION MICRODISCECTOMY N/A 02/15/2022   Procedure: Laminectomy and Foraminotomy - Lumbar Two-Lumbar Three - Lumbar Three-Lumbar Four - Lumbar Four-Lumbar Five;  Surgeon: Earnie Larsson, MD;  Location: [place];  Service: Neurosurgery;  Laterality:  N/A;  Laminectomy and Foraminotomy - Lumbar Two-Lumbar Three - Lumbar Three-Lumbar Four - Lumbar Four-Lumbar Five   THORACIC DISCECTOMY Right 06/19/2021   Procedure: Microdiscectomy - right - Thoracic Eleven-Thoracic Twelve;  Surgeon: Earnie Larsson, MD;  Location: [place];  Service: Neurosurgery;  Laterality: Right;   TONSILLECTOMY  1959   Social History   Social History   Marital Status: Married    Spouse Name: N/A   Number of Children: 2   Occupational History   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     colchicine 0.6 MG tablet Take 1 tablet (0.6 mg total) by mouth daily. 30 tablet 0   Continuous Blood Gluc Receiver (FREESTYLE LIBRE 2 READER) DEVI 1 each by Does not apply route daily. 1 each 0   Continuous Blood Gluc Sensor (FREESTYLE LIBRE 2 SENSOR) MISC 1 each by Does not apply route every 14 (fourteen) days. 6 each 3   ferrous sulfate 325 (65 FE) MG tablet Take 1 tablet (325 mg total) by mouth daily with breakfast. 30 tablet 0   fluticasone (FLONASE) 50 MCG/ACT nasal spray Place 1 spray into both nostrils daily.     furosemide (LASIX) 40 MG tablet Take 1 tablet by mouth once daily 30 tablet 3   Homeopathic Products (LIVER SUPPORT SL) Place 1 capsule under the tongue 2 (two) times daily.     HYDROcodone-acetaminophen (NORCO/VICODIN) 5-325 MG tablet Take 1 tablet by  mouth every 6 (six) hours as needed for moderate pain or severe pain.     insulin glargine (LANTUS SOLOSTAR) 100 UNIT/ML Solostar Pen Inject 26 Units into the skin daily. 30 mL 3   Insulin lispro (HUMALOG JUNIOR KWIKPEN) 100 UNIT/ML Inject 6-8 Units into the skin 3 (three) times daily before meals. Only with a big meal 15 mL 3   Insulin Pen Needle 32G X 4 MM MISC Use 4x a day - with insulin 200 each 3   metFORMIN (GLUCOPHAGE) 1000 MG tablet TAKE 1 TABLET BY MOUTH TWICE DAILY WITH MEALS 180 tablet 0   Multiple Vitamin (MULTIVITAMIN WITH MINERALS) TABS tablet Take 1 tablet by mouth at bedtime. One a day     ONETOUCH DELICA LANCETS FINE MISC Use to test blood sugar 2 times daily as instructed. Dx: E11.59, E11.65 100 each 5   pantoprazole (PROTONIX) 40 MG tablet Take 1 tablet (40 mg total) by mouth daily. 30 tablet 11   PARoxetine (PAXIL) 40 MG tablet Take 1 tablet (40 mg total) by mouth every morning. 30 tablet 0   propranolol (INDERAL) 80 MG tablet Take 80 mg by mouth 2 (two) times daily.     rosuvastatin (CRESTOR) 10 MG tablet Take 1 tablet (10 mg total) by mouth daily. 90 tablet 3   spironolactone (ALDACTONE) 100 MG tablet Take 1 tablet (100 mg total) by mouth daily. 30 tablet 3   tadalafil (CIALIS) 10 MG tablet Take 1 tablet (10 mg total) by mouth every other day as needed for erectile dysfunction. 10 tablet 3   tamsulosin (FLOMAX) 0.4 MG CAPS capsule Take 1 capsule (0.4 mg total) by mouth daily. 90 capsule 3   triamcinolone cream (KENALOG) 0.1 % APPLY TOPICALLY TO SCALP TWO TIMES DAILY AS NEEDED (Patient taking differently: 1 application. daily as needed (Psoriasis).) 30 g 0   No current facility-administered medications on file prior to visit.   No Known Allergies  Family History  Problem Relation Age of Onset   Heart failure Father        Deceased 87 y/o   Diabetes Father    Cancer Father        Prostate  Heart failure Mother        Deceased 49 y/o   Alcohol abuse Mother     Schizophrenia Son        Paranoid   Uveitis Son    Cancer Brother        prostate   Colon cancer Neg Hx    Esophageal cancer Neg Hx    Stomach cancer Neg Hx    Liver cancer Neg Hx    Pancreatic cancer Neg Hx    Rectal cancer Neg Hx    PE: BP 138/90 (BP Location: Right Arm, Patient Position: Sitting, Cuff Size: Normal)   Pulse (!) 103   Ht '5\' 9"'$  (1.753 m)   Wt 173 lb (78.5 kg)   SpO2 96%   BMI 25.55 kg/m   Wt Readings from Last 3 Encounters:  06/15/22 173 lb (78.5 kg)  04/12/22 171 lb (77.6 kg)  03/12/22 159 lb 9.6 oz (72.4 kg)   Constitutional: overweight, in NAD, walks with a walker Eyes: PERRLA, EOMI, no exophthalmos ENT: moist mucous membranes, no thyromegaly, no cervical lymphadenopathy Cardiovascular: tachycardia, RR, No MRG, no LE edema Respiratory: CTA B Musculoskeletal: no deformities, Skin: moist, warm, + extensive abrasions on left forearm Neurological: + tremor with outstretched hands  ASSESSMENT: 1. DM2, insulin-dependent, uncontrolled, with complications - CAD - s/p 4 stents 2013 - seeing Dr Burt Knack - PN - Hypoglycemic seizures  Component     Latest Ref Rng 06/09/2016  C-Peptide     0.80-3.85 ng/mL 1.78  Glucose, Fasting     65 - 99 mg/dL 184 (H)  Glutamic Acid Decarb Ab     <5 IU/mL <5  Pancreatic Islet Cell Antibody     < 5 JDF Units <5   2. HL  PLAN:  1. Patient with longstanding, uncontrolled, type 2 diabetes, with history of variability in his blood sugars suggestive of insulin deficiency, on a regimen of metformin and basal/bolus insulin.  At last visit I again suggested Ozempic, which was previously too expensive, but now obtained from the patient assistance program. -His diabetes control is impaired by his continuing to drink alcohol.  He has a history of severe hypoglycemic episodes.  I did discuss in the past that this may also be related to his alcohol use and recommended to try to stop, which he is doing.  No severe hypoglycemic episodes  since last visit. -At last visit, sugars are much better controlled during the night and a.  Of the day but they were increasing after approximately 1 PM.  He had loss of data after 7 PM and we discussed about the importance of scanning the device more frequently in the evening.  He was eating a small lunch and not bolusing insulin for this.  I advised him that even for such a small meal (a pack of labs) he still needs Humalog.  We did not change the regimen otherwise CGM interpretation: -At today's visit, we reviewed his CGM downloads: It appears that 63% of values are in target range (goal >70%), while 37% are higher than 180 (goal <25%), and 0% are lower than 70 (goal <4%).  The calculated average blood sugar is 172.   -Reviewing the CGM trends, it appears that he is still has a lot of data loss as he is not scanning his sensor consistently.  We have no consistent information between 4 PM and 4 AM.  However, the available blood sugars show that he has significant hypoglycemia overnight, and sugars improved during the  day, but remain above target.  Upon questioning, he forgot about our discussion at last visit to take the Humalog before every meal and he is only using it before larger meals, only approximately 1-2 times a week.  I again underlined the importance of taking Humalog before each meal, 6 units with a smaller meal and 8 units for larger meal.  We may need to reduce these doses if he is able to start Ozempic but he did not start yet.  He is waiting for this from the patient assistance program.  For now, I would not suggest other changes in his regimen - I suggested to:  Patient Instructions  Please change: - Metformin 1000 mg 2x a day, with meals - Lantus 26 units in am  - Humalog 6-8 units 15 min before EVERY meal.  If you forget to take Humalog before dinner, take 4-5 units only after dinner   Please return in 3-4 months.  - we checked his HbA1c: 6.2% (stable, but lower than expected from  his CGM) - advised to check sugars at different times of the day - 4x a day, rotating check times - advised for yearly eye exams >> he is UTD - return to clinic in 3-4 months     2. HL -Reviewed latest lipid panel from 06/2021: LDL slightly above target, the rest of the fractions at goal: Lab Results  Component Value Date   CHOL 135 07/14/2021   HDL 56.40 07/14/2021   LDLCALC 64 07/14/2021   LDLDIRECT 103.9 11/05/2013   TRIG 76.0 07/14/2021   CHOLHDL 2 07/14/2021  -He continues on Crestor 10 mg daily without side effects  Philemon Kingdom, MD PhD [facility] End[facility]

## 2022-06-15 NOTE — Patient Instructions (Signed)
Please change: - Metformin 1000 mg 2x a day, with meals - Lantus 26 units in am  - Humalog 6-8 units 15 min before EVERY meal.  If you forget to take Humalog before dinner, take 4-5 units only after dinner   Please return in 3 months.

## 2022-06-17 ENCOUNTER — Telehealth: Payer: Self-pay

## 2022-06-17 DIAGNOSIS — M48062 Spinal stenosis, lumbar region with neurogenic claudication: Secondary | ICD-10-CM | POA: Diagnosis not present

## 2022-06-17 NOTE — Progress Notes (Signed)
    Chronic Care Management Pharmacy Assistant   Name: Tarin Navarez  MRN: [id number] DOB: 08-30-1951  Reason for Encounter: CCM (Appointment Reminder)  Medications: Outpatient Encounter Medications as of 06/17/2022  Medication Sig Note   carboxymethylcellul-glycerin (OPTIVE) 0.5-0.9 % ophthalmic solution Place 1 drop into both eyes daily as needed for dry eyes.    colchicine 0.6 MG tablet Take 1 tablet (0.6 mg total) by mouth daily.    Continuous Blood Gluc Receiver (FREESTYLE LIBRE 2 READER) DEVI 1 each by Does not apply route daily.    Continuous Blood Gluc Sensor (FREESTYLE LIBRE 2 SENSOR) MISC 1 each by Does not apply route every 14 (fourteen) days.    ferrous sulfate 325 (65 FE) MG tablet Take 1 tablet (325 mg total) by mouth daily with breakfast.    fluticasone (FLONASE) 50 MCG/ACT nasal spray Place 1 spray into both nostrils daily.    furosemide (LASIX) 40 MG tablet Take 1 tablet by mouth once daily    Homeopathic Products (LIVER SUPPORT SL) Place 1 capsule under the tongue 2 (two) times daily.    HYDROcodone-acetaminophen (NORCO/VICODIN) 5-325 MG tablet Take 1 tablet by mouth every 6 (six) hours as needed for moderate pain or severe pain.    insulin glargine (LANTUS SOLOSTAR) 100 UNIT/ML Solostar Pen Inject 26 Units into the skin daily. 02/15/2022: 13 units    Insulin lispro (HUMALOG JUNIOR KWIKPEN) 100 UNIT/ML Inject 6-8 Units into the skin 3 (three) times daily before meals. Only with a big meal    Insulin Pen Needle 32G X 4 MM MISC Use 4x a day - with insulin    metFORMIN (GLUCOPHAGE) 1000 MG tablet TAKE 1 TABLET BY MOUTH TWICE DAILY WITH MEALS    Multiple Vitamin (MULTIVITAMIN WITH MINERALS) TABS tablet Take 1 tablet by mouth at bedtime. One a day    ONETOUCH DELICA LANCETS FINE MISC Use to test blood sugar 2 times daily as instructed. Dx: E11.59, E11.65    pantoprazole (PROTONIX) 40 MG tablet Take 1 tablet (40 mg total) by mouth daily.    PARoxetine (PAXIL) 40 MG tablet Take 1  tablet (40 mg total) by mouth every morning.    propranolol (INDERAL) 80 MG tablet Take 80 mg by mouth 2 (two) times daily.    rosuvastatin (CRESTOR) 10 MG tablet Take 1 tablet (10 mg total) by mouth daily.    spironolactone (ALDACTONE) 100 MG tablet Take 1 tablet (100 mg total) by mouth daily.    tadalafil (CIALIS) 10 MG tablet Take 1 tablet (10 mg total) by mouth every other day as needed for erectile dysfunction.    tamsulosin (FLOMAX) 0.4 MG CAPS capsule Take 1 capsule (0.4 mg total) by mouth daily.    triamcinolone cream (KENALOG) 0.1 % APPLY TOPICALLY TO SCALP TWO TIMES DAILY AS NEEDED (Patient taking differently: 1 application. daily as needed (Psoriasis).)    No facility-administered encounter medications on file as of 06/17/2022.   [name] was contacted to remind of upcoming telephone visit with Charlene Brooke  on 06/22/2022 at 11:00. Patient was reminded to have any blood glucose and blood pressure readings available for review at appointment.   Message was left reminding patient of appointment.  CCM referral has been placed prior to visit?  Yes    Star Rating Drugs: Medication:  Last Fill: Day Supply Metformin 1000 mg 05/05/22 90 Rosuvastatin 10 mg 04/622 [place], CPP notified  Marijean Niemann, [place] Pharmacy Assistant [phone]

## 2022-06-18 ENCOUNTER — Encounter: Payer: Self-pay | Admitting: Internal Medicine

## 2022-06-21 MED ORDER — SILDENAFIL CITRATE 100 MG PO TABS: 100.0000 mg | ORAL_TABLET | Freq: Every day | ORAL | 3 refills | Status: AC | PRN

## 2022-06-22 ENCOUNTER — Telehealth: Payer: Self-pay

## 2022-06-22 ENCOUNTER — Other Ambulatory Visit: Payer: Self-pay | Admitting: Gastroenterology

## 2022-06-22 ENCOUNTER — Ambulatory Visit (INDEPENDENT_AMBULATORY_CARE_PROVIDER_SITE_OTHER): Payer: Medicare Other | Admitting: Pharmacist

## 2022-06-22 DIAGNOSIS — K7030 Alcoholic cirrhosis of liver without ascites: Secondary | ICD-10-CM

## 2022-06-22 DIAGNOSIS — E785 Hyperlipidemia, unspecified: Secondary | ICD-10-CM

## 2022-06-22 DIAGNOSIS — I10 Essential (primary) hypertension: Secondary | ICD-10-CM

## 2022-06-22 DIAGNOSIS — M48062 Spinal stenosis, lumbar region with neurogenic claudication: Secondary | ICD-10-CM | POA: Diagnosis not present

## 2022-06-22 DIAGNOSIS — Z794 Long term (current) use of insulin: Secondary | ICD-10-CM

## 2022-06-22 NOTE — Progress Notes (Signed)
Chronic Care Management Pharmacy Note  06/29/2022 Name:  David [name] MRN:  [id number] DOB:  Jan 13, 1951  Summary: CCM F/U visit -Pt reports spironolactone dose reduction to 50 mg has improved gynecomastia side effect -Ozempic PAP was completed per endocrine, pt has not heard back from company about approval; Ozempic will likely allow pt to stop Humalog and reduce risk for hypoglcyemia so will continue to pursue this  Recommendations/Changes made from today's visit: -Follow up with [employer] re: Ozempic approval -No med changes  Plan: -[place] will follow up with [place] -Pharmacist follow up televisit scheduled for 6 months -PCP F/U 10/13/22   Subjective: David [name] is an 71 y.o. year old male who is a primary patient of David Carbon, MD.  The CCM team was consulted for assistance with disease management and care coordination needs.    Engaged with patient by telephone for follow up visit in response to provider referral for pharmacy case management and/or care coordination services.   Consent to Services:  The patient was given information about Chronic Care Management services, agreed to services, and gave verbal consent prior to initiation of services.  Please see initial visit note for detailed documentation.   Patient Care Team: David Carbon, MD as PCP - Cyndia Diver, MD as PCP - Cardiology (Cardiology) Charlton Haws, [facility] as Pharmacist (Pharmacist)  Recent office visits: 04/12/22 Dr Silvio Pate OV: f/u - reduce hydrocodone to 5 mg tab. Urged to further reduce beer consumption.  01/12/22 Dr Silvio Pate OV: AWV - DM foot exam done. Rx'd tamsulosin 0.4 mg daily.  Recent consult visits: 06/15/22 Dr Cruzita Lederer (endocrine): f/u DM; A1c 6.2%; continue Lantus 26 u, take Humalog before every meal. RTC 3 months.  03/28/22 GI pt message - reduce spironolactone to 50 mg. Repeat BMP 3 wks.  03/12/22 Dr Cruzita Lederer (Endocrine): f/u DM. A1c 6.2%; Advised to  take Humalog 15 min before every meal.   02/09/22 Dr Ardis Hughs (GI): f/u cirrhosis. Reduce pantoprazole to daily. Refilled Rx.  02/02/22 NP Laurann Montana (Cardiology): preop exam (back surgery); no changes  01/28/22 Dr Cruzita Lederer (Endocrine): f/u DM. Reduce Lantus 32 > 26 units daily due to lows.  12/31/21 Dr Cruzita Lederer (endocrine): f/u DM. Change NPH to Lantus. D/C Ozempic  12/18/2021 - Gastroenterology - Pt presented for decompensated cirrhosis. He continues to drink alcohol every other day. Recommend reduction. Due to some ascites, increase Lasix 40 mg daily and increase aldactone to 100 mg daily.  11/24/21 Endocrine pt message: change metformin to 1000 mg once daily, reduce NPH to 20 units AM, 15 units HS, and reduce Humalog to 6-8 units  11/17/2021 - Dr Cruzita Lederer (Endocrinology) -  Reduce Humalog to 8-10 units before meals. Restart Ozempic. Order CGM monitor to pharmacy. Follow up 1 1/2 months.   Hospital visits: 02/15/22-02/16/2022 - admission for lumbar surgery 11/21/21 - ED visit, hypoglycemia  09/05/21-09/10/21: admission for septic arthritis. D/C'd on IV cefazolin x 2 wks.  Objective:  Lab Results  Component Value Date   CREATININE 1.36 (H) 02/15/2022   BUN 17 02/15/2022   GFR 75.91 02/05/2022   GFRNONAA 55 (L) 02/15/2022   GFRAA 64.14 09/22/2021   NA 137 02/15/2022   K 4.1 02/15/2022   CALCIUM 9.3 02/15/2022   CO2 22 02/15/2022   GLUCOSE 127 (H) 02/15/2022    Lab Results  Component Value Date/Time   HGBA1C 6.2 (A) 06/15/2022 01:37 PM   HGBA1C 6.2 (A) 03/12/2022 02:23 PM   HGBA1C 5.9 (H) 01/21/2022 02:33  PM   HGBA1C 6.8 (H) 05/21/2021 08:25 AM   GFR 75.91 02/05/2022 02:45 PM   GFR 59.70 (L) 01/01/2022 01:21 PM   MICROALBUR 2.0 (H) 07/14/2021 11:40 AM   MICROALBUR 9.1 (H) 02/11/2010 10:11 AM    Last diabetic Eye exam:  Lab Results  Component Value Date/Time   HMDIABEYEEXA No Retinopathy 12/24/2019 12:00 AM    Last diabetic Foot exam: 09/08/20 podiatry   Lab Results   Component Value Date   CHOL 135 07/14/2021   HDL 56.40 07/14/2021   LDLCALC 64 07/14/2021   LDLDIRECT 103.9 11/05/2013   TRIG 76.0 07/14/2021   CHOLHDL 2 07/14/2021       Latest Ref Rng & Units 02/15/2022    9:04 AM 01/21/2022    2:33 PM 12/26/2021    8:15 AM  Hepatic Function  Total Protein 6.5 - 8.1 g/dL 7.2  7.2  7.8   Albumin 3.5 - 5.0 g/dL 3.3  3.2  3.4   AST 15 - 41 U/L 54  63  87   ALT 0 - 44 U/L 32  52  40   Alk Phosphatase 38 - 126 U/L 103  126  118   Total Bilirubin 0.3 - 1.2 mg/dL 1.0  0.8  0.9     Lab Results  Component Value Date/Time   TSH 4.496 12/26/2021 08:15 AM   TSH 1.81 11/05/2013 12:49 PM   TSH 0.90 02/16/2011 11:01 AM   FREET4 0.87 05/16/2015 09:41 AM   FREET4 0.7 02/05/2009 04:40 PM       Latest Ref Rng & Units 02/15/2022    9:04 AM 12/26/2021    8:15 AM 12/18/2021    2:48 PM  CBC  WBC 4.0 - 10.5 K/uL 4.7  6.9  5.8   Hemoglobin 13.0 - 17.0 g/dL 9.1  10.1  10.0   Hematocrit 39.0 - 52.0 % 28.1  30.5  30.5   Platelets 150 - 400 K/uL 115  122  125.0     No results found for: "VD25OH"  Clinical ASCVD: Yes  The 10-year ASCVD risk score (Arnett DK, et al., 2019) is: 40.4%   Values used to calculate the score:     Age: 71 years     Sex: Male     Is Non-Hispanic African American: No     Diabetic: Yes     Tobacco smoker: No     Systolic Blood Pressure: 712 mmHg     Is BP treated: Yes     HDL Cholesterol: 56.4 mg/dL     Total Cholesterol: 135 mg/dL     Social History   Tobacco Use  Smoking Status Former   Types: Cigarettes   Quit date: 11/30/1983   Years since quitting: 38.6   Passive exposure: Never  Smokeless Tobacco Never   BP Readings from Last 3 Encounters:  06/15/22 138/90  04/12/22 114/62  03/12/22 130/80   Pulse Readings from Last 3 Encounters:  06/15/22 (!) 103  04/12/22 89  03/12/22 99   Wt Readings from Last 3 Encounters:  06/15/22 173 lb (78.5 kg)  04/12/22 171 lb (77.6 kg)  03/12/22 159 lb 9.6 oz (72.4 kg)   BMI  Readings from Last 3 Encounters:  06/15/22 25.55 kg/m  04/12/22 25.25 kg/m  03/12/22 23.57 kg/m    Assessment/Interventions: Review of patient past medical history, allergies, medications, health status, including review of consultants reports, laboratory and other test data, was performed as part of comprehensive evaluation and provision of chronic care management  services.   SDOH:  (Social Determinants of Health) assessments and interventions performed: No - done Jan 2023  SDOH Screenings   Alcohol Screen: Not on file  Depression (PHQ2-9): Not on file  Financial Resource Strain: Medium Risk (12/24/2021)   Overall Financial Resource Strain (CARDIA)    Difficulty of Paying Living Expenses: Somewhat hard  Food Insecurity: No Food Insecurity (12/24/2021)   Hunger Vital Sign    Worried About Running Out of Food in the Last Year: Never true    Ran Out of Food in the Last Year: Never true  Housing: Not on file  Physical Activity: Not on file  Social Connections: Not on file  Stress: Not on file  Tobacco Use: Medium Risk (06/15/2022)   Patient History    Smoking Tobacco Use: Former    Smokeless Tobacco Use: Never    Passive Exposure: Never  Transportation Needs: Not on file    [place]  No Known Allergies   Medications Reviewed Today     Reviewed by Charlton Haws, [place] (Pharmacist) on 06/22/22 at 75  Med List Status: <None>   Medication Order Taking? Sig Documenting Provider Last Dose Status Informant  carboxymethylcellul-glycerin (OPTIVE) 0.5-0.9 % ophthalmic solution [id number] Yes Place 1 drop into both eyes daily as needed for dry eyes. [provider] Taking Active Self  colchicine 0.6 MG tablet [id number] Yes Take 1 tablet (0.6 mg total) by mouth daily. Medina-Vargas, Senaida Lange, NP Taking Active Self           Med Note Rolan Bucco Dec 24, 2021  4:30 PM)    Continuous Blood Gluc Receiver (FREESTYLE LIBRE 2 READER) DEVI [id number] Yes 1  each by Does not apply route daily. Philemon Kingdom, MD Taking Active Self  Continuous Blood Gluc Sensor (FREESTYLE LIBRE 2 SENSOR) [place] [id number] Yes 1 each by Does not apply route every 14 (fourteen) days. Philemon Kingdom, MD Taking Active Self  ferrous sulfate 325 (65 FE) MG tablet [id number] Yes Take 1 tablet (325 mg total) by mouth daily with breakfast. Medina-Vargas, Monina C, NP Taking Active Self  fluticasone (FLONASE) 50 MCG/ACT nasal spray [id number] Yes Place 1 spray into both nostrils daily. [provider] Taking Active Self  furosemide (LASIX) 40 MG tablet [id number] Yes Take 1 tablet by mouth once daily Milus Banister, MD Taking Active   Homeopathic Products (LIVER SUPPORT SL) [id number] Yes Place 1 capsule under the tongue 2 (two) times daily. [provider] Taking Active Self  HYDROcodone-acetaminophen (NORCO/VICODIN) 5-325 MG tablet [id number] Yes Take 1 tablet by mouth every 6 (six) hours as needed for moderate pain or severe pain. [provider] Taking Active Self           Med Note Rolan Bucco Dec 24, 2021  4:30 PM)    insulin glargine (LANTUS SOLOSTAR) 100 UNIT/ML Solostar Pen [id number] Yes Inject 26 Units into the skin daily. Philemon Kingdom, MD Taking Active            Med Note Charlton Haws   Tue Jun 22, 2022 11:12 AM)    Insulin lispro (HUMALOG JUNIOR [facility]) 100 UNIT/ML [id number] Yes Inject 6-8 Units into the skin 3 (three) times daily before meals. Only with a big meal Philemon Kingdom, MD Taking Active   Insulin Pen Needle 32G X 4 MM MISC [id number] Yes Use 4x a day - with insulin Philemon Kingdom, MD Taking Active Self  metFORMIN (GLUCOPHAGE) 1000 MG tablet  [id number] Yes TAKE 1 TABLET BY MOUTH TWICE DAILY WITH MEALS Philemon Kingdom, MD Taking Active   Multiple Vitamin (MULTIVITAMIN WITH MINERALS) TABS tablet [id number] Yes Take 1 tablet by mouth at bedtime. One a day [provider] Taking Active Self   Jonetta Speak LANCETS [place] [id number] Yes Use to test blood sugar 2 times daily as instructed. Dx: E11.59, E11.65 Philemon Kingdom, MD Taking Active Self  pantoprazole (PROTONIX) 40 MG tablet [id number] Yes Take 1 tablet (40 mg total) by mouth daily. Milus Banister, MD Taking Active   PARoxetine (PAXIL) 40 MG tablet [id number] Yes Take 1 tablet (40 mg total) by mouth every morning. Medina-Vargas, Senaida Lange, NP Taking Active Self           Med Note Rolan Bucco Dec 24, 2021  4:29 PM)    propranolol (INDERAL) 80 MG tablet [id number] Yes Take 80 mg by mouth 2 (two) times daily. [provider] Taking Active Self  rosuvastatin (CRESTOR) 10 MG tablet [id number] Yes Take 1 tablet (10 mg total) by mouth daily. Sherren Mocha, MD Taking Active   sildenafil (VIAGRA) 100 MG tablet [id number] Yes Take 1 tablet (100 mg total) by mouth daily as needed for erectile dysfunction. David Carbon, MD Taking Active   spironolactone (ALDACTONE) 50 MG tablet [id number] Yes Take 50 mg by mouth daily. Milus Banister, MD Taking Active   tamsulosin [facility]) 0.4 MG CAPS capsule [id number] Yes Take 1 capsule (0.4 mg total) by mouth daily. David Carbon, MD Taking Active Self  triamcinolone cream (KENALOG) 0.1 % [id number] Yes APPLY TOPICALLY TO SCALP TWO TIMES DAILY AS NEEDED  Patient taking differently: 1 application  daily as needed (Psoriasis).   Medina-Vargas, Senaida Lange, NP Taking Active Self            Patient Active Problem List   Diagnosis Date Noted   Lumbar stenosis with neurogenic claudication 02/15/2022   Hypoglycemia 11/22/2021   Alcohol use disorder, moderate, dependence ([place]) 11/22/2021   At risk for adverse drug event 09/14/2021   Fall 09/09/2021   Pseudogout of knee, left 81/11/7508   Alcoholic cirrhosis of liver without ascites ([place]) 09/06/2021   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 ([place]) 09/06/2021   Myelopathy concurrent with and due to spinal  stenosis of thoracic region [facility]) 06/19/2021   BPH associated with nocturia 06/10/2020   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[place]) 12/11/2019   History of esophageal varices 25/85/2778   Alcoholic cirrhosis of liver with ascites ([place])    Secondary esophageal varices without bleeding ([place])    Macrocytic anemia 08/31/2018   Poorly controlled type 2 diabetes mellitus with circulatory disorder ([place]) 01/18/2017   Elevated liver function tests 07/13/2016   Thrombocytopenia ([place]) 07/13/2016   Trigger middle finger of left hand 03/02/2016   Primary osteoarthritis of both first carpometacarpal joints 12/02/2015   Advance directive discussed with patient 05/16/2015   Routine general medical examination at a health care facility 02/16/2011   Erectile dysfunction 02/16/2011   Essential hypertension 12/21/2010   Hyperlipidemia LDL goal <70 06/24/2009   Osteoarthritis, multiple sites 04/16/2009   CAD (coronary artery disease) 02/04/2009   Major depressive disorder, recurrent episode ([place]) 08/21/2008   ALLERGIC RHINITIS 08/21/2008   Sleep apnea 08/21/2008    Immunization History  Administered Date(s) Administered   Fluad Quad(high Dose 65+) 08/07/2019, 10/02/2020, 08/21/2021   Hep A / Hep B 04/02/2020, 04/09/2020, 05/01/2020   Influenza Split 08/30/2011, 09/18/2012   Influenza Whole  08/21/2008, 10/02/2009, 08/18/2010   Influenza, High Dose Seasonal PF 08/29/2018   Influenza,inj,Quad PF,6+ Mos 11/05/2013, 09/12/2015, 08/27/2016, 09/02/2017   Influenza-Unspecified 11/05/2013, 09/12/2015, 08/27/2016, 09/02/2017   PFIZER Comirnaty(Gray Top)Covid-19 Tri-Sucrose Vaccine 01/28/2020, 02/28/2020   PFIZER(Purple Top)SARS-COV-2 Vaccination 01/28/2020, 02/28/2020   Pneumococcal Conjugate-13 10/09/2014   Pneumococcal Polysaccharide-23 02/11/2010, 01/18/2017   Td 11/29/2002   Tdap 11/05/2013, 12/26/2021    Conditions to be addressed/monitored:  Hypertension, Hyperlipidemia, Diabetes, and  Depression, Cirrhosis   Care Plan : [place]  Updates made by Charlton Haws, [place] since 06/29/2022 12:00 AM     Problem: Hypertension, Hyperlipidemia, Diabetes, and Depression, Cirrhosis      Long-Range Goal: Disease Management   Start Date: 06/08/2021  Expected End Date: 12/24/2022  This Visit's Progress: On track  Recent Progress: On track  Priority: High  Note:   Current Barriers:  Unable to independently afford treatment regimen Unable to independently monitor therapeutic efficacy  Pharmacist Clinical Goal(s):  Patient will verbalize ability to afford treatment regimen achieve adherence to monitoring guidelines and medication adherence to achieve therapeutic efficacy through collaboration with PharmD and provider.   Interventions: 1:1 collaboration with David Carbon, MD regarding development and update of comprehensive plan of care as evidenced by provider attestation and co-signature Inter-disciplinary care team collaboration (see longitudinal plan of care) Comprehensive medication review performed; medication list updated in electronic medical record  Hypertension (BP goal <140/90) -Controlled - BP is at goal during office visits; given comorbid cirrhosis diuretic regimen and beta blocker below are appropriate -Current treatment: Spironolactone 50 mg daily - Appropriate, Effective, Safe, Accessible Furosemide 40 mg daily -Appropriate, Effective, Safe, Accessible Propranolol 80 mg BID -Appropriate, Effective, Safe, Accessible -Medications previously tried: losartan, metoprolol -Current home readings: none, he plans to purchase a home BP monitor -Denies hypotensive/hypertensive symptoms -Educated on Importance of home blood pressure monitoring; -Counseled to monitor BP at home periodically -Recommended to continue current medication  Diabetes (A1c goal <7%) -Controlled - A1c 6.2% (02/2022); he is now using CGM to monitor sugars; he is interested in  starting Ozempic but cannot afford copay, endocrine office faxed [employer] application months ago and patient has not heard anything back -Managed by endocrinology (Dr Cruzita Lederer) -Current home glucose readings: Freestyle Libre - range 110-190; scanning 4-5x daily -Current medications: Metformin 1000 mg BID w/ dinner - Appropriate, Effective, Safe, Accessible Lantus 26 units daily - Appropriate, Effective, Safe, Accessible Humalog - Inject 6-8 units before meals -Appropriate, Effective, Query Safe, Accessible Ozempic 0.25 mg weekly (not started - cost) [employer] 2  -Medications previously tried: Research officer, trade union (cost) -Educated on A1c and blood sugar goals; Prevention and management of hypoglycemic episodes -Pt would benefit from GLP-1 RA, it may be able to replace prandial insulin and reduce risk for hypoglycemia; will have Paediatric nurse for next steps -Recommend to continue current medication  Hyperlipidemia: (LDL goal < 70) -Controlled - LDL 64 (06/2021); pt endorses compliance with statin -Hx CAD -Current treatment: Rosuvastatin 10 mg daily - Appropriate, Effective, Safe, Accessible -Medications previously tried: n/a  -Educated on Cholesterol goals; Benefits of statin for ASCVD risk reduction; -Recommended to continue current medication  Cirrhosis (Goal: slow progression) -Query Controlled - ascites may be worse per recent GI visit, ultrasound pending 01/09/2023 -Dx 2019, ETOH related. MELD 8 from 11/2021; esophageal varices present -Current treatment  Furosemide 40 mg daily - Appropriate, Effective, Safe, Accessible Spironolactone 100 mg daily - Appropriate, Effective, Safe, Accessible Propranolol 80 mg BID - Appropriate, Effective, Safe, Accessible Liver  Support supplement -Discussed benefits of diuretics and propranolol for managing complications of cirrhosis -Recommended to continue current medication  Depression (Goal: manage symptoms) -Controlled - per pt report -PHQ,  GAD-7 not on file -Current treatment: Paroxetine 40 mg daily - Appropriate, Effective, Safe, Accessible -Medications previously tried/failed: venlafaxine -Educated on Benefits of medication for symptom control -Will need to be cautious with changing SSRI/SNRI in future - several are not able to be used in cirrhosis (particularly duloxetine - avoid in cirrhosis) -Recommended to continue current medication  Health Maintenance -Tamsulosin for BPH -Pantoprazole for GERD -Colchicine 0.6 mg daily for gout prevention   Patient Goals/Self-Care Activities Patient will:  - take medications as prescribed as evidenced by patient report and record review focus on medication adherence by routine check glucose daily, document, and provide at future appointments check blood pressure periodically, document, and provide at future appointments collaborate with provider on medication access solutions (Ozempic)      Medication Assistance:  [place] PAP in progress via Endocrine office.  Compliance/Adherence/Medication fill history: Care Gaps: Eye exam (due 12/23/20)  Star-Rating Drugs: Rosuvastatin - PDC 72%; LF 05/04/22 x 90 ds  Patient's preferred pharmacy is: [facility] [address], [place] [address] [place] [address] Phone: [phone] Fax: [phone]  Uses pill box? No - has not needed one yet Pt endorses 100% compliance  We discussed: Current pharmacy is preferred with insurance plan and patient is satisfied with pharmacy services Patient decided to: Continue current medication management strategy  Care Plan and Follow Up Patient Decision:  Patient agrees to Care Plan and Follow-up.  Follow Up Plan: Telephone follow up appointment with care management team member scheduled for: 6 months  Charlene Brooke, PharmD, BCACP Clinical Pharmacist [facility] [phone]

## 2022-06-22 NOTE — Progress Notes (Addendum)
    Chronic Care Management Pharmacy Assistant   Name: David [name]  MRN: [id number] DOB: [birth year]  Reason for Encounter: CCM (Ozempic PAP Follow-Up)   [employer] to follow-up on patient assistance forms submitted by Dr. Arman Filter (Endocrinology) office. [employer] did not receive pages 3 and 4. They received page 5, but does not have any medication completed by the provider. [employer] is requesting for all pages of the application be faxed to them again. Please include patient's full name, date of birth and ID #: [id number] on the fax cove sheet. Please fax to: [phone].  Charlene Brooke, CPP notified  Marijean Niemann, [place] Clinical Pharmacy Assistant [phone]

## 2022-06-24 ENCOUNTER — Encounter: Payer: Self-pay | Admitting: Internal Medicine

## 2022-06-28 ENCOUNTER — Encounter: Payer: Self-pay | Admitting: Cardiovascular Disease

## 2022-06-28 DIAGNOSIS — Z794 Long term (current) use of insulin: Secondary | ICD-10-CM

## 2022-06-28 DIAGNOSIS — I10 Essential (primary) hypertension: Secondary | ICD-10-CM | POA: Diagnosis not present

## 2022-06-28 DIAGNOSIS — E785 Hyperlipidemia, unspecified: Secondary | ICD-10-CM | POA: Diagnosis not present

## 2022-06-28 DIAGNOSIS — E119 Type 2 diabetes mellitus without complications: Secondary | ICD-10-CM

## 2022-06-29 NOTE — Patient Instructions (Addendum)
Visit Information  Phone number for Pharmacist: [phone]   Goals Addressed   None     Care Plan : [place]  Updates made by Charlton Haws, RPH since 06/29/2022 12:00 AM     Problem: Hypertension, Hyperlipidemia, Diabetes, and Depression, Cirrhosis      Long-Range Goal: Disease Management   Start Date: 06/08/2021  Expected End Date: 12/24/2022  This Visit's Progress: On track  Recent Progress: On track  Priority: High  Note:   Current Barriers:  Unable to independently afford treatment regimen Unable to independently monitor therapeutic efficacy  Pharmacist Clinical Goal(s):  Patient will verbalize ability to afford treatment regimen achieve adherence to monitoring guidelines and medication adherence to achieve therapeutic efficacy through collaboration with PharmD and provider.   Interventions: 1:1 collaboration with [name], MD regarding development and update of comprehensive plan of care as evidenced by provider attestation and co-signature Inter-disciplinary care team collaboration (see longitudinal plan of care) Comprehensive medication review performed; medication list updated in electronic medical record  Hypertension (BP goal <140/90) -Controlled - BP is at goal during office visits; given comorbid cirrhosis diuretic regimen and beta blocker below are appropriate -Current treatment: Spironolactone 50 mg daily - Appropriate, Effective, Safe, Accessible Furosemide 40 mg daily -Appropriate, Effective, Safe, Accessible Propranolol 80 mg BID -Appropriate, Effective, Safe, Accessible -Medications previously tried: losartan, metoprolol -Current home readings: none, he plans to purchase a home BP monitor -Denies hypotensive/hypertensive symptoms -Educated on Importance of home blood pressure monitoring; -Counseled to monitor BP at home periodically -Recommended to continue current medication  Diabetes (A1c goal <7%) -Controlled - A1c  6.2% (02/2022); he is now using CGM to monitor sugars; he is interested in starting Ozempic but cannot afford copay, endocrine office faxed Novo Cares application months ago and patient has not heard anything back -Managed by endocrinology (Dr Cruzita Lederer) -Current home glucose readings: Freestyle Libre - range 110-190; scanning 4-5x daily -Current medications: Metformin 1000 mg BID w/ dinner - Appropriate, Effective, Safe, Accessible Lantus 26 units daily - Appropriate, Effective, Safe, Accessible Humalog - Inject 6-8 units before meals -Appropriate, Effective, Query Safe, Accessible Ozempic 0.25 mg weekly (not started - cost) [employer] 2  -Medications previously tried: Research officer, trade union (cost) -Educated on A1c and blood sugar goals; Prevention and management of hypoglycemic episodes -Pt would benefit from GLP-1 RA, it may be able to replace prandial insulin and reduce risk for hypoglycemia; will have Paediatric nurse for next steps -Recommend to continue current medication  Hyperlipidemia: (LDL goal < 70) -Controlled - LDL 64 (06/2021); pt endorses compliance with statin -Hx CAD -Current treatment: Rosuvastatin 10 mg daily - Appropriate, Effective, Safe, Accessible -Medications previously tried: n/a  -Educated on Cholesterol goals; Benefits of statin for ASCVD risk reduction; -Recommended to continue current medication  Cirrhosis (Goal: slow progression) -Query Controlled - ascites may be worse per recent GI visit, ultrasound pending 01/24/23 -Dx 2019, ETOH related. MELD 8 from 11/2021; esophageal varices present -Current treatment  Furosemide 40 mg daily - Appropriate, Effective, Safe, Accessible Spironolactone 100 mg daily - Appropriate, Effective, Safe, Accessible Propranolol 80 mg BID - Appropriate, Effective, Safe, Accessible Liver Support supplement -Discussed benefits of diuretics and propranolol for managing complications of cirrhosis -Recommended to continue current  medication  Depression (Goal: manage symptoms) -Controlled - per pt report -PHQ, GAD-7 not on file -Current treatment: Paroxetine 40 mg daily - Appropriate, Effective, Safe, Accessible -Medications previously tried/failed: venlafaxine -Educated on Benefits of medication for symptom control -Will need to be  cautious with changing SSRI/SNRI in future - several are not able to be used in cirrhosis (particularly duloxetine - avoid in cirrhosis) -Recommended to continue current medication  Health Maintenance -Tamsulosin for BPH -Pantoprazole for GERD -Colchicine 0.6 mg daily for gout prevention   Patient Goals/Self-Care Activities Patient will:  - take medications as prescribed as evidenced by patient report and record review focus on medication adherence by routine check glucose daily, document, and provide at future appointments check blood pressure periodically, document, and provide at future appointments collaborate with provider on medication access solutions (Ozempic)       Patient verbalizes understanding of instructions and care plan provided today and agrees to view in [place]. Active MyChart status and patient understanding of how to access instructions and care plan via MyChart confirmed with patient.    Telephone follow up appointment with pharmacy team member scheduled for: 6 months  Charlene Brooke, PharmD, [facility] Clinical Pharmacist [facility] at [facility] [phone]

## 2022-07-06 DIAGNOSIS — M48062 Spinal stenosis, lumbar region with neurogenic claudication: Secondary | ICD-10-CM | POA: Diagnosis not present

## 2022-07-14 DIAGNOSIS — M48062 Spinal stenosis, lumbar region with neurogenic claudication: Secondary | ICD-10-CM | POA: Diagnosis not present

## 2022-07-21 DIAGNOSIS — M48062 Spinal stenosis, lumbar region with neurogenic claudication: Secondary | ICD-10-CM | POA: Diagnosis not present

## 2022-08-03 ENCOUNTER — Other Ambulatory Visit: Payer: Self-pay | Admitting: Adult Health

## 2022-08-03 ENCOUNTER — Other Ambulatory Visit: Payer: Self-pay | Admitting: Gastroenterology

## 2022-08-03 DIAGNOSIS — K7030 Alcoholic cirrhosis of liver without ascites: Secondary | ICD-10-CM

## 2022-08-03 DIAGNOSIS — F339 Major depressive disorder, recurrent, unspecified: Secondary | ICD-10-CM

## 2022-08-04 ENCOUNTER — Telehealth: Payer: Self-pay | Admitting: Gastroenterology

## 2022-08-04 NOTE — Telephone Encounter (Signed)
Patient called stating he was returning your call. I advised him that we would need to reschedule his upcoming appointment on 9/12 with Dr. Ardis Hughs due to him being on a leave. Patient was rescheduled with Colleen on 10/6 at 11:00. Patient is seeking advice if he still needs to come in for labs or not. Please advise.

## 2022-08-06 ENCOUNTER — Ambulatory Visit: Payer: Medicare Other | Admitting: Cardiovascular Disease

## 2022-08-08 ENCOUNTER — Other Ambulatory Visit: Payer: Self-pay | Admitting: Adult Health

## 2022-08-08 ENCOUNTER — Other Ambulatory Visit: Payer: Self-pay | Admitting: Gastroenterology

## 2022-08-08 DIAGNOSIS — K7030 Alcoholic cirrhosis of liver without ascites: Secondary | ICD-10-CM

## 2022-08-08 DIAGNOSIS — F339 Major depressive disorder, recurrent, unspecified: Secondary | ICD-10-CM

## 2022-08-10 ENCOUNTER — Ambulatory Visit: Payer: Medicare Other | Admitting: Gastroenterology

## 2022-08-10 ENCOUNTER — Telehealth: Payer: Self-pay

## 2022-08-10 NOTE — Telephone Encounter (Signed)
Lvm for pt to advise patient assistance Ozempic (5 boxes) has been delivered and is ready for pick up.

## 2022-08-11 ENCOUNTER — Encounter: Payer: Self-pay | Admitting: Internal Medicine

## 2022-08-11 DIAGNOSIS — M48062 Spinal stenosis, lumbar region with neurogenic claudication: Secondary | ICD-10-CM | POA: Diagnosis not present

## 2022-08-11 NOTE — Telephone Encounter (Signed)
Patient picked up patient assistance Ozempic (5 boxes) - log noted and chart noted

## 2022-08-12 ENCOUNTER — Encounter: Payer: Self-pay | Admitting: Internal Medicine

## 2022-08-12 DIAGNOSIS — F339 Major depressive disorder, recurrent, unspecified: Secondary | ICD-10-CM

## 2022-08-13 MED ORDER — PAROXETINE HCL 40 MG PO TABS: 40.0000 mg | ORAL_TABLET | ORAL | 3 refills | Status: AC

## 2022-08-18 ENCOUNTER — Ambulatory Visit: Payer: Medicare Other | Attending: Cardiovascular Disease | Admitting: Cardiovascular Disease

## 2022-08-18 ENCOUNTER — Encounter: Payer: Self-pay | Admitting: Cardiovascular Disease

## 2022-08-18 VITALS — BP 140/88 | HR 87 | Ht 69.0 in | Wt 165.2 lb

## 2022-08-18 DIAGNOSIS — E782 Mixed hyperlipidemia: Secondary | ICD-10-CM

## 2022-08-18 DIAGNOSIS — I25118 Atherosclerotic heart disease of native coronary artery with other forms of angina pectoris: Secondary | ICD-10-CM

## 2022-08-18 NOTE — Patient Instructions (Signed)
Medication Instructions:  Your physician recommends that you continue on your current medications as directed. Please refer to the Current Medication list given to you today.  *If you need a refill on your cardiac medications before your next appointment, please call your pharmacy*   Lab Work: NONE If you have labs (blood work) drawn today and your tests are completely normal, you will receive your results only by: MyChart Message (if you have MyChart) OR A paper copy in the mail If you have any lab test that is abnormal or we need to change your treatment, we will call you to review the results.   Testing/Procedures: NONE   Follow-Up: At [facility], you and your health needs are our priority.  As part of our continuing mission to provide you with exceptional heart care, we have created designated Provider Care Teams.  These Care Teams include your primary Cardiologist (physician) and Advanced Practice Providers (APPs -  Physician Assistants and Nurse Practitioners) who all work together to provide you with the care you need, when you need it.  Your next appointment:   1 year(s)  The format for your next appointment:   In Person  Provider:   Michael Cooper, MD  or APP     Important Information About Sugar       

## 2022-08-18 NOTE — Progress Notes (Signed)
Cardiology Office Note:    Date:  08/18/2022   ID:  David [name], DOB [birth year], MRN [id number]  PCP:  [name], MD   [place] Providers Cardiologist:  Sherren Mocha, MD     Referring MD: [name], MD   Chief Complaint  Patient presents with   Coronary Artery Disease    History of Present Illness:    David [name] is a 71 y.o. male with a hx of coronary artery disease, type 2 diabetes, hypertension, hyperlipidemia, presenting for follow-up evaluation.  The patient was last seen here in March 2023 by Laurann Montana for preoperative evaluation before back surgery.  He did not have any cardiac complications from his back surgery.  He had a difficult time he continues to have problems with his balance.  He has peripheral neuropathy.  The patient has cirrhosis with grade 2 esophageal varices.  He has been managed with Inderal and spironolactone.  He reports no bleeding problems.  He has not had any heart related issues and specifically denies chest pain, chest pressure, leg swelling, shortness of breath, orthopnea, or PND.  Past Medical History:  Diagnosis Date   Allergy    Anemia    Arthritis    Bilateral hands and feet   CAD (coronary artery disease)    s/p bifurcation stenting (DES x2) and stenting bifurcation lesion distal RCA (1 DES)   Cataract    Cirrhosis (HCC)    Depression    Diabetes mellitus    Phylliss Blakes - type 2   Esophageal varices (HCC)    GERD (gastroesophageal reflux disease)    Hyperlipemia    Hypertension    Myocardial infarction [facility])    Sleep apnea    denies wearing CPAP    Past Surgical History:  Procedure Laterality Date   BIOPSY  09/01/2018   Procedure: BIOPSY;  Surgeon: Mauri Pole, MD;  Location: [facility];  Service: Endoscopy;;   CATARACT EXTRACTION Bilateral    CORONARY ANGIOPLASTY WITH STENT PLACEMENT   March 2010   Dr. Olevia Perches   ESOPHAGOGASTRODUODENOSCOPY (EGD) WITH PROPOFOL N/A 09/01/2018    Procedure: ESOPHAGOGASTRODUODENOSCOPY (EGD) WITH PROPOFOL;  Surgeon: Mauri Pole, MD;  Location: [facility];  Service: Endoscopy;  Laterality: N/A;   KNEE ARTHROSCOPY Left 09/07/2021   Procedure: IRRIGATION AND DEBRIDEMENT OF LEFT KNEE; ARTHROSCOPY OF LEFT KNEE, PARTIAL MEDIAL [place].;  Surgeon: [name], MD;  Location: [facility] ORS;  Service: Orthopedics;  Laterality: Left;   LEFT HEART CATH AND CORONARY ANGIOGRAPHY N/A 08/31/2018   Procedure: LEFT HEART CATH AND CORONARY ANGIOGRAPHY;  Surgeon: Sherren Mocha, MD;  Location: [facility];  Service: Cardiovascular;  Laterality: N/A;   LUMBAR LAMINECTOMY/DECOMPRESSION MICRODISCECTOMY N/A 02/15/2022   Procedure: Laminectomy and Foraminotomy - Lumbar Two-Lumbar Three - Lumbar Three-Lumbar Four - Lumbar Four-Lumbar Five;  Surgeon: Earnie Larsson, MD;  Location: [facility];  Service: Neurosurgery;  Laterality: N/A;  Laminectomy and Foraminotomy - Lumbar Two-Lumbar Three - Lumbar Three-Lumbar Four - Lumbar Four-Lumbar Five   THORACIC DISCECTOMY Right 06/19/2021   Procedure: Microdiscectomy - right - Thoracic Eleven-Thoracic Twelve;  Surgeon: Earnie Larsson, MD;  Location: [place] [facility];  Service: Neurosurgery;  Laterality: Right;   TONSILLECTOMY  1959    Current Medications: Current Meds  Medication Sig   colchicine 0.6 MG tablet Take 1 tablet (0.6 mg total) by mouth daily.   Continuous Blood Gluc Receiver (FREESTYLE LIBRE 2 READER) DEVI 1 each by Does not apply route daily.   Continuous Blood Gluc  Sensor (FREESTYLE LIBRE 2 SENSOR) MISC 1 each by Does not apply route every 14 (fourteen) days.   ferrous sulfate 325 (65 FE) MG tablet Take 1 tablet (325 mg total) by mouth daily with breakfast.   fluticasone (FLONASE) 50 MCG/ACT nasal spray Place 1 spray into both nostrils daily.   furosemide (LASIX) 40 MG tablet Take 1 tablet by mouth once daily   Homeopathic Products (LIVER SUPPORT SL) Place 1 capsule under the tongue 2 (two) times daily.    HYDROcodone-acetaminophen (NORCO/VICODIN) 5-325 MG tablet Take 1 tablet by mouth every 6 (six) hours as needed for moderate pain or severe pain.   insulin glargine (LANTUS SOLOSTAR) 100 UNIT/ML Solostar Pen Inject 26 Units into the skin daily.   Insulin lispro (HUMALOG JUNIOR KWIKPEN) 100 UNIT/ML Inject 6-8 Units into the skin 3 (three) times daily before meals. Only with a big meal   metFORMIN (GLUCOPHAGE) 1000 MG tablet TAKE 1 TABLET BY MOUTH TWICE DAILY WITH MEALS   Multiple Vitamin (MULTIVITAMIN WITH MINERALS) TABS tablet Take 1 tablet by mouth at bedtime. One a day   pantoprazole (PROTONIX) 40 MG tablet Take 1 tablet (40 mg total) by mouth daily.   PARoxetine (PAXIL) 40 MG tablet Take 1 tablet (40 mg total) by mouth every morning.   propranolol (INDERAL) 80 MG tablet Take 80 mg by mouth 2 (two) times daily.   rosuvastatin (CRESTOR) 10 MG tablet Take 1 tablet (10 mg total) by mouth daily.   Semaglutide,0.25 or 0.'5MG'$ /DOS, (OZEMPIC, 0.25 OR 0.5 MG/DOSE,) 2 MG/1.5ML SOPN Inject into the skin. Per patient taking once weekly   sildenafil (VIAGRA) 100 MG tablet Take 1 tablet (100 mg total) by mouth daily as needed for erectile dysfunction.   spironolactone (ALDACTONE) 50 MG tablet Take 50 mg by mouth daily.   tamsulosin (FLOMAX) 0.4 MG CAPS capsule Take 1 capsule (0.4 mg total) by mouth daily.     Allergies:   Patient has no known allergies.   Social History   Socioeconomic History   Marital status: Divorced    Spouse name: Not on file   Number of children: 2   Years of education: Not on file   Highest education level: Not on file  Occupational History   Occupation: Patent examiner buildings    Comment: site closed   Occupation: Engineering geologist    Comment: part time    Comment: Part time work  Tobacco Use   Smoking status: Former    Types: Cigarettes    Quit date: 11/30/1983    Years since quitting: 38.7    Passive exposure: Never   Smokeless tobacco: Never  Vaping Use    Vaping Use: Never used  Substance and Sexual Activity   Alcohol use: Yes    Alcohol/week: 24.0 standard drinks of alcohol    Types: 24 Cans of beer per week    Comment: per pt 10-15 beers/week (as of 06/18/21)   Drug use: No   Sexual activity: Not Currently  Other Topics Concern   Not on file  Social History Narrative   No living will   Would want want son [name], as health care POA   Would accept resuscitation   Not sure about tube feeds--but doesn't want if cognitively unaware   Social Determinants of Health   Financial Resource Strain: Medium Risk (12/24/2021)   Overall Financial Resource Strain (CARDIA)    Difficulty of Paying Living Expenses: Somewhat hard  Food Insecurity: No Food Insecurity (12/24/2021)   Hunger Vital Sign  Worried About Charity fundraiser in the Last Year: Never true    Ran Out of Food in the Last Year: Never true  Transportation Needs: Not on file  Physical Activity: Not on file  Stress: Not on file  Social Connections: Not on file     Family History: The patient's family history includes Alcohol abuse in his mother; Cancer in his brother and father; Diabetes in his father; Heart failure in his father and mother; Schizophrenia in his son; Uveitis in his son. There is no history of Colon cancer, Esophageal cancer, Stomach cancer, Liver cancer, Pancreatic cancer, or Rectal cancer.  ROS:   Please see the history of present illness.    All other systems reviewed and are negative.  EKGs/Labs/Other Studies Reviewed:    The following studies were reviewed today: Echo 08/28/2018: - Left ventricle: The cavity size was normal. Systolic function was    normal. The estimated ejection fraction was in the range of 60%    to 65%. Wall motion was normal; there were no regional wall    motion abnormalities.  - Aortic valve: There was no regurgitation.  - Aorta: Ascending aortic diameter: 40 mm (S).  - Ascending aorta: The ascending aorta was mildly dilated.  -  Mitral valve: There was trivial regurgitation.  - Left atrium: The atrium was moderately dilated.  - Right ventricle: The cavity size was moderately dilated. Wall    thickness was normal. Systolic function was normal.  - Right atrium: The atrium was mildly dilated.  - Atrial septum: No defect or patent foramen ovale was identified.  - Tricuspid valve: There was trivial regurgitation.  - Pulmonic valve: There was no significant regurgitation.  - Pulmonary arteries: PA peak pressure: 32 mm Hg (S).   Impressions:   - Normal to hyperdynamic LV function, no regional wall motion    abnormalities. Mildly dilated ascending aorta. Moderately dilated    RV with normal to hyperdynamic function.   Stress Myoview 08/28/2018: Nuclear stress EF: 64%. Blood pressure demonstrated a hypertensive response to exercise. Downsloping ST segment depression ST segment depression of 3 mm was noted during stress in the II, III, aVF, V6, V5 and V4 leads. T wave inversion was noted during stress in the II, III, aVF, V6, V5 and V4 leads, and returning to baseline after 1-5 mins of recovery. This is an intermediate risk study.   Normal perfusion images. LVEF 64% with normal wall motion. However, poor exercise tolerance with chest pain, pallor and 3 mm lateral ST segment depression at exercise. Clinical findings suggestive of ischemia. Clinical correlation is strongly advised.  Cardiac Cath 08/31/2018: Prox RCA to Mid RCA lesion is 40% stenosed. Ost RPDA to RPDA lesion is 40% stenosed. Post Atrio lesion is 50% stenosed. Ost 2nd Mrg to 2nd Mrg lesion is 25% stenosed. Mid Cx to Dist Cx lesion is 10% stenosed. Previously placed Prox Cx to Mid Cx stent (unknown type) is widely patent.   1. Patent stents in the left circumflex with mild restenosis 2. Patent stents in the RCA with mild in stent restenosis 3. Widely patent left main and LAD 4. Mildly elevated LVEDP   Stable coronary anatomy without high-grade coronary  stenosis. Medical therapy for CAD. OK to proceed with GI evaluation as needed.   EKG:  EKG is not ordered today.   Recent Labs: 12/26/2021: Magnesium 2.2; TSH 4.496 02/15/2022: ALT 32; BUN 17; Creatinine, Ser 1.36; Hemoglobin 9.1; Platelets 115; Potassium 4.1; Sodium 137  Recent Lipid Panel  Component Value Date/Time   CHOL 135 07/14/2021 1140   TRIG 76.0 07/14/2021 1140   HDL 56.40 07/14/2021 1140   CHOLHDL 2 07/14/2021 1140   VLDL 15.2 07/14/2021 1140   LDLCALC 64 07/14/2021 1140   LDLDIRECT 103.9 11/05/2013 1249     Risk Assessment/Calculations:          Physical Exam:    VS:  BP (!) 140/88   Pulse 87   Ht '5\' 9"'$  (1.753 m)   Wt 165 lb 3.2 oz (74.9 kg)   SpO2 97%   BMI 24.40 kg/m     Wt Readings from Last 3 Encounters:  08/18/22 165 lb 3.2 oz (74.9 kg)  06/15/22 173 lb (78.5 kg)  04/12/22 171 lb (77.6 kg)     GEN:  Well nourished, well developed, ambulating with a walker in no acute distress HEENT: Normal NECK: No JVD; No carotid bruits LYMPHATICS: No lymphadenopathy CARDIAC: RRR, no murmurs, rubs, gallops RESPIRATORY:  Clear to auscultation without rales, wheezing or rhonchi  ABDOMEN: Soft, non-tender, non-distended MUSCULOSKELETAL:  No edema; No deformity  SKIN: Warm and dry NEUROLOGIC:  Alert and oriented x 3 PSYCHIATRIC:  Normal affect   ASSESSMENT:    1. Coronary artery disease of native artery of native heart with stable angina pectoris ([place])   2. Mixed hyperlipidemia    PLAN:    In order of problems listed above:  The patient is stable from a cardiovascular perspective.  He is off of all antiplatelet therapy because of hepatic cirrhosis with esophageal varices.  He has not had any ischemic symptoms in many years.  His last heart catheterization from 2019 showed no obstructive CAD with patency at his stent sites. Treated with low-dose rosuvastatin.  Last lipids with a cholesterol 135, HDL 56, LDL 64.  Continue the same.           Medication  Adjustments/Labs and Tests Ordered: Current medicines are reviewed at length with the patient today.  Concerns regarding medicines are outlined above.  No orders of the defined types were placed in this encounter.  No orders of the defined types were placed in this encounter.   Patient Instructions  Medication Instructions:  Your physician recommends that you continue on your current medications as directed. Please refer to the Current Medication list given to you today.  *If you need a refill on your cardiac medications before your next appointment, please call your pharmacy*   Lab Work: NONE If you have labs (blood work) drawn today and your tests are completely normal, you will receive your results only by: [place] (if you have MyChart) OR A paper copy in the mail If you have any lab test that is abnormal or we need to change your treatment, we will call you to review the results.   Testing/Procedures: NONE   Follow-Up: At [facility], you and your health needs are our priority.  As part of our continuing mission to provide you with exceptional heart care, we have created designated Provider Care Teams.  These Care Teams include your primary Cardiologist (physician) and Advanced Practice Providers (APPs -  Physician Assistants and Nurse Practitioners) who all work together to provide you with the care you need, when you need it.  Your next appointment:   1 year(s)  The format for your next appointment:   In Person  Provider:   Sherren Mocha, MD  or APP     Important Information About Sugar  Signed, Sherren Mocha, MD  08/18/2022 2:36 PM    [place]

## 2022-08-25 ENCOUNTER — Encounter: Payer: Self-pay | Admitting: Internal Medicine

## 2022-08-27 ENCOUNTER — Ambulatory Visit: Payer: Medicare Other | Admitting: Internal Medicine

## 2022-08-30 ENCOUNTER — Ambulatory Visit: Payer: Medicare Other | Admitting: Internal Medicine

## 2022-08-31 ENCOUNTER — Encounter: Payer: Self-pay | Admitting: Internal Medicine

## 2022-08-31 ENCOUNTER — Ambulatory Visit (INDEPENDENT_AMBULATORY_CARE_PROVIDER_SITE_OTHER): Payer: Medicare Other | Admitting: Internal Medicine

## 2022-08-31 ENCOUNTER — Other Ambulatory Visit (INDEPENDENT_AMBULATORY_CARE_PROVIDER_SITE_OTHER): Payer: Medicare Other

## 2022-08-31 DIAGNOSIS — R351 Nocturia: Secondary | ICD-10-CM | POA: Diagnosis not present

## 2022-08-31 DIAGNOSIS — K7030 Alcoholic cirrhosis of liver without ascites: Secondary | ICD-10-CM

## 2022-08-31 LAB — COMPREHENSIVE METABOLIC PANEL
ALT: 25 U/L (ref 0–53)
AST: 46 U/L — ABNORMAL HIGH (ref 0–37)
Albumin: 4.1 g/dL (ref 3.5–5.2)
Alkaline Phosphatase: 78 U/L (ref 39–117)
BUN: 13 mg/dL (ref 6–23)
CO2: 28 mEq/L (ref 19–32)
Calcium: 9.6 mg/dL (ref 8.4–10.5)
Chloride: 95 mEq/L — ABNORMAL LOW (ref 96–112)
Creatinine, Ser: 1.14 mg/dL (ref 0.40–1.50)
GFR: 63.83 mL/min (ref >60.00)
Glucose, Bld: 103 mg/dL — ABNORMAL HIGH (ref 70–99)
Potassium: 3.2 mEq/L — ABNORMAL LOW (ref 3.5–5.1)
Sodium: 135 mEq/L (ref 135–145)
Total Bilirubin: 0.9 mg/dL (ref 0.2–1.2)
Total Protein: 7.9 g/dL (ref 6.0–8.3)

## 2022-08-31 LAB — POC URINALSYSI DIPSTICK (AUTOMATED)
Bilirubin, UA: NEGATIVE
Glucose, UA: NEGATIVE
Ketones, UA: NEGATIVE
Leukocytes, UA: NEGATIVE
Nitrite, UA: NEGATIVE
Protein, UA: POSITIVE — AB
Spec Grav, UA: 1.01 (ref 1.010–1.025)
Urobilinogen, UA: 0.2 E.U./dL
pH, UA: 6 (ref 5.0–8.0)

## 2022-08-31 LAB — PROTIME-INR
INR: 1.1 ratio — ABNORMAL HIGH (ref 0.8–1.0)
Prothrombin Time: 12.1 s (ref 9.6–13.1)

## 2022-08-31 LAB — CBC
HCT: 31.3 % — ABNORMAL LOW (ref 39.0–52.0)
Hemoglobin: 10.9 g/dL — ABNORMAL LOW (ref 13.0–17.0)
MCHC: 34.7 g/dL (ref 30.0–36.0)
MCV: 102.2 fl — ABNORMAL HIGH (ref 78.0–100.0)
Platelets: 111 10*3/uL — ABNORMAL LOW (ref 150.0–400.0)
RBC: 3.06 Mil/uL — ABNORMAL LOW (ref 4.22–5.81)
RDW: 16.9 % — ABNORMAL HIGH (ref 11.5–15.5)
WBC: 5.4 10*3/uL (ref 4.0–10.5)

## 2022-08-31 MED ORDER — TAMSULOSIN HCL 0.4 MG PO CAPS: 0.8000 mg | ORAL_CAPSULE | Freq: Every day | ORAL | 3 refills | Status: AC

## 2022-08-31 NOTE — Assessment & Plan Note (Signed)
Seemed to be doing okay till the last few weeks tamsulosin did help at first Urinalysis shows no leuks/nitrite. 2+ blood but none visible  Will try doubling the flomax to 0.'8mg'$  daily Next step is urologist (and if any blood seen)

## 2022-08-31 NOTE — Progress Notes (Signed)
Subjective:    Patient ID: David [name], male    DOB: Apr 03, 1951, 71 y.o.   MRN: [id number]  HPI Here due to urinary problems  Started with trouble for the past 3-4 weeks Notices it more at night--up every 1-2 hours with urgency (but generally can't go) Will eventually get slow stream --but then up again soon Some urgency during day--can ignore it for a while When he goes then--the stream is better  Did start ozempic about 3 weeks ago  No dysuria or hematuria Urine looks pretty normal--yellow or close to white (drinking enough)  Current Outpatient Medications on File Prior to Visit  Medication Sig Dispense Refill   colchicine 0.6 MG tablet Take 1 tablet (0.6 mg total) by mouth daily. 30 tablet 0   Continuous Blood Gluc Receiver (FREESTYLE LIBRE 2 READER) DEVI 1 each by Does not apply route daily. 1 each 0   Continuous Blood Gluc Sensor (FREESTYLE LIBRE 2 SENSOR) MISC 1 each by Does not apply route every 14 (fourteen) days. 6 each 3   ferrous sulfate 325 (65 FE) MG tablet Take 1 tablet (325 mg total) by mouth daily with breakfast. 30 tablet 0   fluticasone (FLONASE) 50 MCG/ACT nasal spray Place 1 spray into both nostrils daily.     furosemide (LASIX) 40 MG tablet Take 1 tablet by mouth once daily 30 tablet 3   Homeopathic Products (LIVER SUPPORT SL) Place 1 capsule under the tongue 2 (two) times daily.     insulin glargine (LANTUS SOLOSTAR) 100 UNIT/ML Solostar Pen Inject 26 Units into the skin daily. 30 mL 3   Insulin lispro (HUMALOG JUNIOR KWIKPEN) 100 UNIT/ML Inject 6-8 Units into the skin 3 (three) times daily before meals. Only with a big meal 15 mL 3   metFORMIN (GLUCOPHAGE) 1000 MG tablet TAKE 1 TABLET BY MOUTH TWICE DAILY WITH MEALS 180 tablet 0   Multiple Vitamin (MULTIVITAMIN WITH MINERALS) TABS tablet Take 1 tablet by mouth at bedtime. One a day     pantoprazole (PROTONIX) 40 MG tablet Take 1 tablet (40 mg total) by mouth daily. 30 tablet 11   PARoxetine (PAXIL) 40 MG  tablet Take 1 tablet (40 mg total) by mouth every morning. 90 tablet 3   propranolol (INDERAL) 80 MG tablet Take 80 mg by mouth 2 (two) times daily.     rosuvastatin (CRESTOR) 10 MG tablet Take 1 tablet (10 mg total) by mouth daily. 90 tablet 3   Semaglutide,0.25 or 0.'5MG'$ /DOS, (OZEMPIC, 0.25 OR 0.5 MG/DOSE,) 2 MG/1.5ML SOPN Inject into the skin. Per patient taking once weekly     sildenafil (VIAGRA) 100 MG tablet Take 1 tablet (100 mg total) by mouth daily as needed for erectile dysfunction. 10 tablet 3   spironolactone (ALDACTONE) 50 MG tablet Take 50 mg by mouth daily.     tamsulosin (FLOMAX) 0.4 MG CAPS capsule Take 1 capsule (0.4 mg total) by mouth daily. 90 capsule 3   No current facility-administered medications on file prior to visit.    No Known Allergies  Past Medical History:  Diagnosis Date   Allergy    Anemia    Arthritis    Bilateral hands and feet   CAD (coronary artery disease)    s/p bifurcation stenting (DES x2) and stenting bifurcation lesion distal RCA (1 DES)   Cataract    Cirrhosis ([place])    Depression    Diabetes mellitus    Phylliss Blakes - type 2   Esophageal varices (HCC)  GERD (gastroesophageal reflux disease)    Hyperlipemia    Hypertension    Myocardial infarction [facility]    Sleep apnea    denies wearing CPAP    Past Surgical History:  Procedure Laterality Date   BIOPSY  09/01/2018   Procedure: BIOPSY;  Surgeon: Mauri Pole, MD;  Location: [place];  Service: Endoscopy;;   CATARACT EXTRACTION Bilateral    CORONARY ANGIOPLASTY WITH STENT PLACEMENT   March 2010   Dr. Olevia Perches   ESOPHAGOGASTRODUODENOSCOPY (EGD) WITH PROPOFOL N/A 09/01/2018   Procedure: ESOPHAGOGASTRODUODENOSCOPY (EGD) WITH PROPOFOL;  Surgeon: Mauri Pole, MD;  Location: [place] [facility];  Service: Endoscopy;  Laterality: N/A;   KNEE ARTHROSCOPY Left 09/07/2021   Procedure: IRRIGATION AND DEBRIDEMENT OF LEFT KNEE; ARTHROSCOPY OF LEFT KNEE, PARTIAL MEDIAL [place].;   Surgeon: [name], MD;  Location: [facility];  Service: Orthopedics;  Laterality: Left;   LEFT HEART CATH AND CORONARY ANGIOGRAPHY N/A 08/31/2018   Procedure: LEFT HEART CATH AND CORONARY ANGIOGRAPHY;  Surgeon: Sherren Mocha, MD;  Location: [place] [facility];  Service: Cardiovascular;  Laterality: N/A;   LUMBAR LAMINECTOMY/DECOMPRESSION MICRODISCECTOMY N/A 02/15/2022   Procedure: Laminectomy and Foraminotomy - Lumbar Two-Lumbar Three - Lumbar Three-Lumbar Four - Lumbar Four-Lumbar Five;  Surgeon: Earnie Larsson, MD;  Location: [place];  Service: Neurosurgery;  Laterality: N/A;  Laminectomy and Foraminotomy - Lumbar Two-Lumbar Three - Lumbar Three-Lumbar Four - Lumbar Four-Lumbar Five   THORACIC DISCECTOMY Right 06/19/2021   Procedure: Microdiscectomy - right - Thoracic Eleven-Thoracic Twelve;  Surgeon: Earnie Larsson, MD;  Location: [place];  Service: Neurosurgery;  Laterality: Right;   TONSILLECTOMY  1959    Family History  Problem Relation Age of Onset   Heart failure Father        Deceased 76 y/o   Diabetes Father    Cancer Father        Prostate   Heart failure Mother        Deceased 98 y/o   Alcohol abuse Mother    Schizophrenia Son        Paranoid   Uveitis Son    Cancer Brother        prostate   Colon cancer Neg Hx    Esophageal cancer Neg Hx    Stomach cancer Neg Hx    Liver cancer Neg Hx    Pancreatic cancer Neg Hx    Rectal cancer Neg Hx     Social History   Socioeconomic History   Marital status: Divorced    Spouse name: Not on file   Number of children: 2   Years of education: Not on file   Highest education level: Not on file  Occupational History   Occupation: Patent examiner buildings    Comment: site closed   Occupation: Engineering geologist    Comment: part time    Comment: Part time work  Tobacco Use   Smoking status: Former    Types: Cigarettes    Quit date: 11/30/1983    Years since quitting: 38.7    Passive exposure: Never   Smokeless tobacco: Never   Vaping Use   Vaping Use: Never used  Substance and Sexual Activity   Alcohol use: Yes    Alcohol/week: 24.0 standard drinks of alcohol    Types: 24 Cans of beer per week    Comment: per pt 10-15 beers/week (as of 06/18/21)   Drug use: No   Sexual activity: Not Currently  Other Topics Concern   Not on file  Social History Narrative   No living will   Would want want son [name], as health care POA   Would accept resuscitation   Not sure about tube feeds--but doesn't want if cognitively unaware   Social Determinants of Health   Financial Resource Strain: Medium Risk (12/24/2021)   Overall Financial Resource Strain (CARDIA)    Difficulty of Paying Living Expenses: Somewhat hard  Food Insecurity: No Food Insecurity (12/24/2021)   Hunger Vital Sign    Worried About Running Out of Food in the Last Year: Never true    Ran Out of Food in the Last Year: Never true  Transportation Needs: Not on file  Physical Activity: Not on file  Stress: Not on file  Social Connections: Not on file  Intimate Partner Violence: Not on file   Review of Systems No fever Not sick Sugars are controlled     Objective:   Physical Exam Constitutional:      Appearance: Normal appearance.  Abdominal:     General: There is no distension.     Tenderness: There is no abdominal tenderness.     Comments: 3-4 cm of suprapubic dullness--not striking  Neurological:     Mental Status: He is alert.            Assessment & Plan:

## 2022-08-31 NOTE — Patient Instructions (Signed)
Double the tamsulosin (flomax) to 2 tabs a day. If you are not better within 2 weeks, let me know and I will set you up with a urologist.

## 2022-08-31 NOTE — Addendum Note (Signed)
Addended by: Pilar Grammes on: 08/31/2022 03:12 PM   Modules accepted: Orders

## 2022-09-01 ENCOUNTER — Encounter: Payer: Self-pay | Admitting: Gastroenterology

## 2022-09-01 ENCOUNTER — Telehealth: Payer: Self-pay

## 2022-09-01 DIAGNOSIS — K7030 Alcoholic cirrhosis of liver without ascites: Secondary | ICD-10-CM

## 2022-09-01 MED ORDER — FUROSEMIDE 40 MG PO TABS: 40.0000 mg | ORAL_TABLET | Freq: Every day | ORAL | 1 refills | Status: DC

## 2022-09-01 MED ORDER — SPIRONOLACTONE 50 MG PO TABS: 50.0000 mg | ORAL_TABLET | Freq: Every day | ORAL | 1 refills | Status: AC

## 2022-09-01 NOTE — Telephone Encounter (Signed)
Rx for furosemide and Aldactone sent to pharmacy.  Patient is scheduled to follow up with Carl Best, NP on 09-03-22 at 11:00am.

## 2022-09-01 NOTE — Telephone Encounter (Signed)
Good afternoon Dr Silverio Decamp,   This is a patient of Dr Ardis Hughs.  I am sending you the refill request as you are DOD pm.  Please advise.

## 2022-09-01 NOTE — Telephone Encounter (Signed)
OK to send refill for furosemide and Aldactone for 30 days, please bring him in soon for follow-up visit with APP and labs.

## 2022-09-03 ENCOUNTER — Encounter: Payer: Self-pay | Admitting: Nurse Practitioner

## 2022-09-03 ENCOUNTER — Ambulatory Visit: Payer: Medicare Other | Admitting: Nurse Practitioner

## 2022-09-03 ENCOUNTER — Other Ambulatory Visit: Payer: Medicare Other

## 2022-09-03 DIAGNOSIS — K7030 Alcoholic cirrhosis of liver without ascites: Secondary | ICD-10-CM

## 2022-09-03 MED ORDER — FUROSEMIDE 40 MG PO TABS: 40.0000 mg | ORAL_TABLET | Freq: Every day | ORAL | 2 refills | Status: AC

## 2022-09-03 NOTE — Progress Notes (Signed)
09/03/2022 David [name] [id number] 1951/03/09   Chief Complaint: Cirrhosis follow up  History of Present Illness: David [name] is a 71 year old male with a past medical history of arthritis, depression, coronary artery disease s/p DES x 2  or 3 in 2010, sleep apnea does not use CPAP, GERD, GI bleed 2019, possibly from gastric ulcer and esophageal varices, colon polyps, chronic macrocytic anemia, thrombocytopenia, alcohol use disorder and alcohol associated cirrhosis with esophageal varices. He is followed by Dr. Ardis Hughs.  He presents today for routine cirrhosis follow-up.  He remains adherent with taking Propanolol 80 mg twice daily. He typically takes Furosemide 80m once daily but he ran out of this prescription about 2 weeks ago.  Spironolactone was discontinued 03/2022 secondary to the development of gynecomastia.  He denies having any swelling to his abdomen or legs.  No issues with confusion.  No chest pain, palpitations or dizziness.  No upper or lower abdominal pain.  He is passing normal formed brown bowel movement daily.  No rectal bleeding or black stools.  He maintains a low sodium diet.  He continues to drink 3 beers daily and sometimes on the weekends drinks 5 or 6 beers. His most recent EGD was 04/08/2020 which showed several trunks of large 9> 5MM) varices in the lower third of the esophagus without evidence of recent or impending bleeding, moderate to severe portal gastropathy.  His Propanolol dose 20 mg twice daily was increased to 40 mg twice daily following this EGD.  His Propanolol was subsequently increased to 60 twice daily and February 2022 Propanolol was increased to 80 mg twice daily.  His most recent surveillance RUQ sonogram was completed 06/09/2022 which showed evidence of cirrhosis without any concerning liver masses/hepatomas with mild perihepatic ascites and borderline gallbladder wall thickness.  An AFP was ordered 05/2022 but was not completed.  He underwent laboratory  studies 08/31/2022 which showed a hemoglobin level of 10.9, platelets 111, INR 1.1, total bili 0.9, alk phos 78, AST 46 and ALT 25. MELD Na 9. His most recent colonoscopy was 12/2017 which was normal.  Next colonoscopy due 12/2027. He underwent back surgery 01/2022 without experiencing any postoperative complications.    He has a history of coronary artery disease status post DES x 2 or 3, followed by cardiologist Dr. CBurt Knack  He denies having any chest pain, palpitations or shortness of breath.  He was last seen by Dr. CBurt Knackin office 08/18/2022, at that time his cardiac status was stable and Dr. CBurt Knackdocumented he was okay to proceed with GI evaluation as needed.     Latest Ref Rng & Units 08/31/2022    1:29 PM 02/15/2022    9:04 AM 12/26/2021    8:15 AM  CBC  WBC 4.0 - 10.5 K/uL 5.4  4.7  6.9   Hemoglobin 13.0 - 17.0 g/dL 10.9  9.1  10.1   Hematocrit 39.0 - 52.0 % 31.3  28.1  30.5   Platelets 150.0 - 400.0 K/uL 111.0  115  122    INR 1.1.    Latest Ref Rng & Units 08/31/2022    1:29 PM 02/15/2022    9:04 AM 02/05/2022    2:45 PM  CMP  Glucose 70 - 99 mg/dL 103  127  152   BUN 6 - 23 mg/dL '13  17  22   ' Creatinine 0.40 - 1.50 mg/dL 1.14  1.36  0.99   Sodium 135 - 145 mEq/L 135  137  136  Potassium 3.5 - 5.1 mEq/L 3.2  4.1  4.6   Chloride 96 - 112 mEq/L 95  103  103   CO2 19 - 32 mEq/L '28  22  27   ' Calcium 8.4 - 10.5 mg/dL 9.6  9.3  9.4   Total Protein 6.0 - 8.3 g/dL 7.9  7.2    Total Bilirubin 0.2 - 1.2 mg/dL 0.9  1.0    Alkaline Phos 39 - 117 U/L 78  103    AST 0 - 37 U/L 46  54    ALT 0 - 53 U/L 25  32    2019 labs ANA negative, normal iron studies, Hep C Ab negative, Hep B Surface Ab negative, Hep B Surface Ag negative, Hep A total Ab negative.   CARDIAC STUDIES:  Echo 08/28/2018: - Left ventricle: The cavity size was normal. Systolic function was    normal. The estimated ejection fraction was in the range of 60%    to 65%. Wall motion was normal; there were no regional wall     motion abnormalities.  - Aortic valve: There was no regurgitation.  - Aorta: Ascending aortic diameter: 40 mm (S).  - Ascending aorta: The ascending aorta was mildly dilated.  - Mitral valve: There was trivial regurgitation.  - Left atrium: The atrium was moderately dilated.  - Right ventricle: The cavity size was moderately dilated. Wall    thickness was normal. Systolic function was normal.  - Right atrium: The atrium was mildly dilated.  - Atrial septum: No defect or patent foramen ovale was identified.  - Tricuspid valve: There was trivial regurgitation.  - Pulmonic valve: There was no significant regurgitation.  - Pulmonary arteries: PA peak pressure: 32 mm Hg (S).  Impressions:  - Normal to hyperdynamic LV function, no regional wall motion    abnormalities. Mildly dilated ascending aorta. Moderately dilated    RV with normal to hyperdynamic function.  Cardiac Cath 08/31/2018: Prox RCA to Mid RCA lesion is 40% stenosed. Ost RPDA to RPDA lesion is 40% stenosed. Post Atrio lesion is 50% stenosed. Ost 2nd Mrg to 2nd Mrg lesion is 25% stenosed. Mid Cx to Dist Cx lesion is 10% stenosed. Previously placed Prox Cx to Mid Cx stent (unknown type) is widely patent.   1. Patent stents in the left circumflex with mild restenosis 2. Patent stents in the RCA with mild in stent restenosis 3. Widely patent left main and LAD 4. Mildly elevated LVEDP  GI PROCEDURES:   EGD 04/08/2020: Several trunks of large (> 5 mm) varices were found in the lower third of the esophagus. None with signs of recent or impending bleeding. Moderate to severe changes of portal gastropathy. No gastric varices. The previously noted gastic ulcers have healed.  EGD 09/01/2018 at [facility] secondary to GI bleed - Non-bleeding grade II esophageal varices. - Non-bleeding gastric ulcers with a clean ulcer base (Forrest Class III). Biopsied. - Erythematous duodenopathy. - Normal second portion of the duodenum. - Path H.  Pylori negative  Colonoscopy 2012 - 2 subcentimeter adenomas removed.    Colonoscopy 12/2017:  Normal examination.   Recall recommended 10 years  Current Outpatient Medications on File Prior to Visit  Medication Sig Dispense Refill   colchicine 0.6 MG tablet Take 1 tablet (0.6 mg total) by mouth daily. 30 tablet 0   Continuous Blood Gluc Receiver (FREESTYLE LIBRE 2 READER) DEVI 1 each by Does not apply route daily. 1 each 0   Continuous Blood Gluc Sensor (FREESTYLE LIBRE  2 SENSOR) MISC 1 each by Does not apply route every 14 (fourteen) days. 6 each 3   ferrous sulfate 325 (65 FE) MG tablet Take 1 tablet (325 mg total) by mouth daily with breakfast. 30 tablet 0   fluticasone (FLONASE) 50 MCG/ACT nasal spray Place 1 spray into both nostrils daily.     Homeopathic Products (LIVER SUPPORT SL) Place 1 capsule under the tongue 2 (two) times daily.     insulin glargine (LANTUS SOLOSTAR) 100 UNIT/ML Solostar Pen Inject 26 Units into the skin daily. 30 mL 3   Insulin lispro (HUMALOG JUNIOR KWIKPEN) 100 UNIT/ML Inject 6-8 Units into the skin 3 (three) times daily before meals. Only with a big meal 15 mL 3   metFORMIN (GLUCOPHAGE) 1000 MG tablet TAKE 1 TABLET BY MOUTH TWICE DAILY WITH MEALS 180 tablet 0   Multiple Vitamin (MULTIVITAMIN WITH MINERALS) TABS tablet Take 1 tablet by mouth at bedtime. One a day     pantoprazole (PROTONIX) 40 MG tablet Take 1 tablet (40 mg total) by mouth daily. 30 tablet 11   PARoxetine (PAXIL) 40 MG tablet Take 1 tablet (40 mg total) by mouth every morning. 90 tablet 3   propranolol (INDERAL) 80 MG tablet Take 80 mg by mouth 2 (two) times daily.     rosuvastatin (CRESTOR) 10 MG tablet Take 1 tablet (10 mg total) by mouth daily. 90 tablet 3   Semaglutide,0.25 or 0.5MG/DOS, (OZEMPIC, 0.25 OR 0.5 MG/DOSE,) 2 MG/1.5ML SOPN Inject into the skin. Per patient taking once weekly     sildenafil (VIAGRA) 100 MG tablet Take 1 tablet (100 mg total) by mouth daily as needed for  erectile dysfunction. 10 tablet 3   spironolactone (ALDACTONE) 50 MG tablet Take 1 tablet (50 mg total) by mouth daily. 30 tablet 1   tamsulosin (FLOMAX) 0.4 MG CAPS capsule Take 2 capsules (0.8 mg total) by mouth daily. 180 capsule 3   No current facility-administered medications on file prior to visit.   No Known Allergies   Current Medications, Allergies, Past Medical History, Past Surgical History, Family History and Social History were reviewed in [employer] record.   Review of Systems:   Constitutional: Negative for fever, sweats, chills or weight loss.  Respiratory: Negative for shortness of breath.   Cardiovascular: Negative for chest pain, palpitations and leg swelling.  Gastrointestinal: See HPI.  Musculoskeletal: + Back pain.  Neurological: + Chronic balance issues, walks with a cane.    Physical Exam: BP 122/76   Pulse (!) 103   Ht '5\' 9"'  (1.753 m)   Wt 167 lb 6.4 oz (75.9 kg)   SpO2 98%   BMI 24.72 kg/m   Wt Readings from Last 3 Encounters:  09/03/22 167 lb 6.4 oz (75.9 kg)  08/31/22 164 lb (74.4 kg)  08/18/22 165 lb 3.2 oz (74.9 kg)    General: 71 year old male in no acute distress. Head: Normocephalic and atraumatic. Eyes: No scleral icterus. Conjunctiva pink . Ears: Normal auditory acuity. Mouth: Dentition intact. No ulcers or lesions.  Lungs: Clear throughout to auscultation. Heart: Regular rate and rhythm, no murmur. Abdomen: Soft, nontender and nondistended.  No ascites.  No masses or hepatomegaly. Normal bowel sounds x 4 quadrants.  Rectal: Deferred. Musculoskeletal: Symmetrical with no gross deformities. Extremities: No edema. Neurological: Alert oriented x 4. No focal deficits.  No asterixis. Psychological: Alert and cooperative. Normal mood and affect  Assessment and Recommendations:  70) 71 year old male with alcohol associated cirrhosis with esophageal varices. His  most recent EGD was 04/08/2020 which showed several  trunks of large 9> 5MM) varices in the lower third of the esophagus without evidence of recent or impending bleeding, moderate to severe portal gastropathy.  He remains on Propanolol 80 mg twice daily. Spironolactone was discontinued 02/2022 secondary to gynecomastia effect. He was taking Lasix 52m po QD but ran out of this prescription 2 weeks ago.  No history of hepatic encephalopathy. -Restart Lasix (Furosemide) 40 mg once daily -Continue Propanolol 80 mg twice daily -Patient counseled no alcohol -Continue 2 g low-sodium diet -EGD to survey esophageal varices benefits and risks discussed including risk with sedation, risk of bleeding, perforation and infection  -Further follow-up to be determined after EGD completed -Continue surveillance abdominal sonogram and AFP every 6 months  2) History of GERD, gastric ulcer and UGI bleed in 2019. -Continue Pantoprazole 40 mg once daily  3) Chronic macrocytic anemia, secondary to alcohol use disorder and cirrhosis.  On Ferrous Sulfate 325 mg once daily.  No overt GI bleeding.  Stable hemoglobin 10.9.  4) Thrombocytopenia.  Platelet count 111.  No splenomegaly per abdominal sonogram 11/2021.  5) Coronary artery disease s/p DES x 3  6) DM II

## 2022-09-03 NOTE — Progress Notes (Signed)
Addendum: Reviewed and agree with assessment and management plan. Given bblocker for primary prophylaxis without variceal bleeding or banding in the past surveillance EGD is not indicated per guidelines. This would change if decompensation occurs.  Bblocker titration to target HR 60 bpm as tolerated by systolic BP. Agree with [place] screening q6 months with [place] or annual with CT/MRI Rosabella Edgin, Lajuan Lines, MD

## 2022-09-03 NOTE — Patient Instructions (Addendum)
_______________________________________________________  If you are age 73 or older, your body mass index should be between 23-30. Your Body mass index is 24.72 kg/m. If this is out of the aforementioned range listed, please consider follow up with your Primary Care Provider.  If you are age 84 or younger, your body mass index should be between 19-25. Your Body mass index is 24.72 kg/m. If this is out of the aformentioned range listed, please consider follow up with your Primary Care Provider.   ________________________________________________________  The [facility] providers would like to encourage you to use [facility] to communicate with providers for non-urgent requests or questions.  Due to long hold times on the telephone, sending your provider a message by [facility] may be a faster and more efficient way to get a response.  Please allow 48 business hours for a response.  Please remember that this is for non-urgent requests.  _______________________________________________________  David [name] have been scheduled for an endoscopy. Please follow written instructions given to you at your visit today. If you use inhalers (even only as needed), please bring them with you on the day of your procedure.  Your provider has requested that you go to the basement level for lab work before leaving today. Press "B" on the elevator. The lab is located at the first door on the left as you exit the elevator.   Due to recent changes in healthcare laws, you may see the results of your imaging and laboratory studies on MyChart before your provider has had a chance to review them.  We understand that in some cases there may be results that are confusing or concerning to you. Not all laboratory results come back in the same time frame and the provider may be waiting for multiple results in order to interpret others.  Please give [place] 48 hours in order for your provider to thoroughly review all the results before contacting  the office for clarification of your results.   It was a pleasure to see you today!  Thank you for trusting me with your gastrointestinal care!

## 2022-09-06 ENCOUNTER — Other Ambulatory Visit: Payer: Self-pay

## 2022-09-06 DIAGNOSIS — E876 Hypokalemia: Secondary | ICD-10-CM

## 2022-09-06 DIAGNOSIS — M533 Sacrococcygeal disorders, not elsewhere classified: Secondary | ICD-10-CM | POA: Diagnosis not present

## 2022-09-06 LAB — AFP TUMOR MARKER: AFP-Tumor Marker: 1.7 ng/mL (ref <6.1)

## 2022-09-06 NOTE — Progress Notes (Signed)
David [name], pls cancel 09/2022 EGD with Dr. Hilarie Fredrickson.  I called the patient and left a detailed message on his personal voicemail that Dr. Hilarie Fredrickson reviewed his office visit and since he is compliant with taking propanolol twice daily to control his varices he does not require an upper endoscopy.  Please call the patient in few days to make sure he received my message.  Thank you

## 2022-09-07 ENCOUNTER — Telehealth: Payer: Self-pay

## 2022-09-07 NOTE — Telephone Encounter (Signed)
Procedure has been canceled.  Pt was made aware. Pt verbalized understanding with all questions answered.

## 2022-09-07 NOTE — Telephone Encounter (Signed)
-----   Message from Noralyn Pick, NP sent at 09/06/2022 11:19 AM EDT -----    ----- Message ----- From: Jerene Bears, MD Sent: 09/03/2022   1:30 PM EDT To: Noralyn Pick, NP     ----- Message ----- From: Noralyn Pick, NP Sent: 09/03/2022   1:19 PM EDT To: Jerene Bears, MD  Dr. Hilarie Fredrickson, I am sending you today's office consult as you are supervising physician today covering for Dr. Ardis Hughs.  I ordered an esophageal variceal surveillance EGD, let me know if you advise otherwise.  Thank you

## 2022-09-08 ENCOUNTER — Other Ambulatory Visit (INDEPENDENT_AMBULATORY_CARE_PROVIDER_SITE_OTHER): Payer: Medicare Other

## 2022-09-08 ENCOUNTER — Ambulatory Visit (INDEPENDENT_AMBULATORY_CARE_PROVIDER_SITE_OTHER): Payer: Medicare Other

## 2022-09-08 DIAGNOSIS — Z23 Encounter for immunization: Secondary | ICD-10-CM | POA: Diagnosis not present

## 2022-09-08 DIAGNOSIS — E876 Hypokalemia: Secondary | ICD-10-CM | POA: Diagnosis not present

## 2022-09-08 LAB — BASIC METABOLIC PANEL
BUN: 20 mg/dL (ref 6–23)
CO2: 34 mEq/L — ABNORMAL HIGH (ref 19–32)
Calcium: 9.5 mg/dL (ref 8.4–10.5)
Chloride: 94 mEq/L — ABNORMAL LOW (ref 96–112)
Creatinine, Ser: 1.38 mg/dL (ref 0.40–1.50)
GFR: 50.74 mL/min — ABNORMAL LOW (ref >60.00)
Glucose, Bld: 131 mg/dL — ABNORMAL HIGH (ref 70–99)
Potassium: 3.4 mEq/L — ABNORMAL LOW (ref 3.5–5.1)
Sodium: 137 mEq/L (ref 135–145)

## 2022-09-13 ENCOUNTER — Telehealth: Payer: Self-pay

## 2022-09-13 MED ORDER — POTASSIUM CHLORIDE CRYS ER 20 MEQ PO TBCR: 20.0000 meq | EXTENDED_RELEASE_TABLET | Freq: Every day | ORAL | 2 refills | Status: AC

## 2022-09-13 NOTE — Telephone Encounter (Signed)
The pt returned the call and has been given the results per Dr Hilarie Fredrickson.  Potassium sent to the [place], MD  09/09/2022  8:39 AM EDT     K is a bit better Would start Kdur 20 meq daily as a supplement This should lead to normal K in setting of his current diuretic doses.  Should diuretic doses change in any way he should let us know      Jaclyn Shaggy the pt states he is taking furosemide 40 mg daily, spironolactone 50 mg daily.    Noralyn Pick, NP  Timothy Lasso, RN Cc: Jerene Bears, MD Jondavid Schreier, can you contact patient and re-verify his diuretics. When I saw him in office 09/03/2022 he stated he ran out of Furosemide 2 weeks prior to that appt so I renewed it but he told me he wasn't taking Spironolactone (Aldactone). Per prior notes from Dr. Ardis Hughs, Spironolactone was discontinued 03/2022 due to breast enlargement/gynecomastia. THX.

## 2022-09-14 ENCOUNTER — Telehealth: Payer: Self-pay

## 2022-09-14 NOTE — Telephone Encounter (Signed)
Lvm for pt to call back, if pt is able to move appt scheduled for 09/17/22 from the afternoon to that morning.

## 2022-09-17 ENCOUNTER — Encounter: Payer: Self-pay | Admitting: Internal Medicine

## 2022-09-17 ENCOUNTER — Ambulatory Visit (INDEPENDENT_AMBULATORY_CARE_PROVIDER_SITE_OTHER): Payer: Medicare Other | Admitting: Internal Medicine

## 2022-09-17 VITALS — BP 138/80 | HR 95 | Ht 69.0 in | Wt 167.0 lb

## 2022-09-17 DIAGNOSIS — E785 Hyperlipidemia, unspecified: Secondary | ICD-10-CM | POA: Diagnosis not present

## 2022-09-17 DIAGNOSIS — Z794 Long term (current) use of insulin: Secondary | ICD-10-CM

## 2022-09-17 DIAGNOSIS — E1159 Type 2 diabetes mellitus with other circulatory complications: Secondary | ICD-10-CM | POA: Diagnosis not present

## 2022-09-17 DIAGNOSIS — E663 Overweight: Secondary | ICD-10-CM | POA: Diagnosis not present

## 2022-09-17 LAB — POCT GLYCOSYLATED HEMOGLOBIN (HGB A1C): Hemoglobin A1C: 5.8 % — AB (ref 4.0–5.6)

## 2022-09-17 MED ORDER — METFORMIN HCL 1000 MG PO TABS: 1000.0000 mg | ORAL_TABLET | Freq: Two times a day (BID) | ORAL | 3 refills | Status: AC

## 2022-09-17 MED ORDER — LANTUS SOLOSTAR 100 UNIT/ML ~~LOC~~ SOPN: 20.0000 [IU] | PEN_INJECTOR | Freq: Every day | SUBCUTANEOUS | 3 refills | Status: AC

## 2022-09-17 MED ORDER — HUMALOG JUNIOR KWIKPEN 100 UNIT/ML ~~LOC~~ SOPN: 4.0000 [IU] | PEN_INJECTOR | Freq: Every day | SUBCUTANEOUS | 3 refills | Status: AC

## 2022-09-17 NOTE — Patient Instructions (Addendum)
Please decrease: - Lantus to 20 units in am  Continue: - Metformin 1000 mg 2x a day, with meals - Ozempic 0.25 mg daily - Humalog 4-8 units before meals  Please return in 4 months.

## 2022-09-17 NOTE — Progress Notes (Signed)
Patient ID: David [name], male   DOB: [birth year], 71 y.o.   MRN: [id number]   HPI: David [name] is a 71 y.o.-year-old male, returning for f/u for DM2, dx in ~2008, insulin-dependent since 09/2015, uncontrolled, with complications (CAD - s/p stents in 2013, PN). Last visit  3 mo ago  Interim history: No blurry vision, nausea, chest pain.   He had no severe hypoglycemic episodes or hypoglycemic seizures since last visit. He walks with a walker due to the propensity for falls. No falls since last OV. He is still drinking alcohol in the evening.  Reviewed HbA1c levels: Lab Results  Component Value Date   HGBA1C 6.2 (A) 06/15/2022   HGBA1C 6.2 (A) 03/12/2022   HGBA1C 5.9 (H) 01/21/2022  03/05/2016: HbA1c 9.1%  He is on: - Metformin 1000 mg 2x a day, with meals - NPH 30 units in a.m. and 35 units at night >> Lantus 34 >> 26 units in a.m. - Humalog 8-12 >> 16 >> 14-20 >> 16-20 >> 14-16 >>  6-8 units 15 min before EVERY meal.  If you forget to take Humalog before dinner, take 4-5 units only after dinner >> actually taking ~5 units before dinner - Ozempic 0.25 mg weekly - restarted 07/2022 (PAP) He was on Ozempic from 01/2019 to 08/2019 but had to come off due to price.   He was on Bydureon 2 mg weekly - started 01/30/2017 >> stopped 04/2017 b/c price - could not restart  Pt checks his sugars more than 4 times a day with his CGM (with receiver):   Prev.:   Previously:   Lowest sugar was 25 (was active that day), OTW 40-50s >> ...96 >> 90 >> 60s; it is unclear at which level he has hypoglycemia awareness. He had severe hypoglycemic episodes before last visit: 23 on regular labs (07/2021), and 25 (10/2021).   He also had hypoglycemic seizures with a blood sugar in the 30s (11/21/2021) and 38 (drank alcohol and only ate a salad after taking the full dose of insulin, 12/26/2021) In 12/2021, he fell when going to the restroom at night.  Blood sugar was 40 at that time. Highest sugar was 300s >>  390 >> 300s.  Glucometer: One Touch  Pt's meals are: - Breakfast: protein shake - Lunch: n/a - Dinner: meat + veggies /potatoes or salad - Snacks: no Diet sodas, ice tea with splenda.  No regular sodas.  -+ CKD, last BUN/creatinine:  Lab Results  Component Value Date   BUN 20 09/08/2022   CREATININE 1.38 09/08/2022  On losartan.  -+ HL; last set of lipids: Lab Results  Component Value Date   CHOL 135 07/14/2021   HDL 56.40 07/14/2021   LDLCALC 64 07/14/2021   LDLDIRECT 103.9 11/05/2013   TRIG 76.0 07/14/2021   CHOLHDL 2 07/14/2021  On Crestor 10.  - last eye exam was in 03/2022: No DR reportedly.  Has previous history of cataract surgery.  - he has numbness and tingling in his feet. UTD with foot exam (12/2021).  He also has HTN, depression.  He has a history of anabolic steroid use for muscle building. He had a cardiac cath 08/2018 for presyncope: Stable coronary anatomy without high-grade coronary stenosis.  He is on medical therapy for CAD. He was found to be anemic (Hb 8.1) >> EGD report reviewed:  - Non-bleeding grade II esophageal varices. - Non-bleeding gastric ulcers with a clean ulcer base (Forrest Class III). Biopsied. - Erythematous duodenopathy. - Normal second portion of the  duodenum. In 2021: He and his wife of 40 years separated and he had a difficult time living outside his home. In 2021 he was also diagnosed with cirrhosis.  He does have a history of heavy alcohol use and he continues to use alcohol.  He has anemia and decreased platelets. He saw a chiropractor for his neuropathy.  He was using infrared light and felt that this was helping.  He had significant leg weakness and had laminectomy and foraminotomy of L2-L5 on 02/15/2022.    ROS: + see HPI + tremors  I reviewed pt's medications, allergies, PMH, social hx, family hx, and changes were documented in the history of present illness. Otherwise, unchanged from my initial visit note.   Past  Medical History:  Diagnosis Date   Allergy    Anemia    Arthritis    Bilateral hands and feet   CAD (coronary artery disease)    s/p bifurcation stenting (DES x2) and stenting bifurcation lesion distal RCA (1 DES)   Cataract    Cirrhosis (HCC)    Depression    Diabetes mellitus    Phylliss Blakes - type 2   Esophageal varices (HCC)    GERD (gastroesophageal reflux disease)    Hyperlipemia    Hypertension    Myocardial infarction [facility])    Sleep apnea    denies wearing CPAP   Past Surgical History:  Procedure Laterality Date   BIOPSY  09/01/2018   Procedure: BIOPSY;  Surgeon: Mauri Pole, MD;  Location: [facility];  Service: Endoscopy;;   CATARACT EXTRACTION Bilateral    CORONARY ANGIOPLASTY WITH STENT PLACEMENT   March 2010   Dr. Olevia Perches   ESOPHAGOGASTRODUODENOSCOPY (EGD) WITH PROPOFOL N/A 09/01/2018   Procedure: ESOPHAGOGASTRODUODENOSCOPY (EGD) WITH PROPOFOL;  Surgeon: Mauri Pole, MD;  Location: [place] [facility];  Service: Endoscopy;  Laterality: N/A;   KNEE ARTHROSCOPY Left 09/07/2021   Procedure: IRRIGATION AND DEBRIDEMENT OF LEFT KNEE; ARTHROSCOPY OF LEFT KNEE, PARTIAL MEDIAL [place].;  Surgeon: [name], MD;  Location: [facility] ORS;  Service: Orthopedics;  Laterality: Left;   LEFT HEART CATH AND CORONARY ANGIOGRAPHY N/A 08/31/2018   Procedure: LEFT HEART CATH AND CORONARY ANGIOGRAPHY;  Surgeon: Sherren Mocha, MD;  Location: [place] CV [facility];  Service: Cardiovascular;  Laterality: N/A;   LUMBAR LAMINECTOMY/DECOMPRESSION MICRODISCECTOMY N/A 02/15/2022   Procedure: Laminectomy and Foraminotomy - Lumbar Two-Lumbar Three - Lumbar Three-Lumbar Four - Lumbar Four-Lumbar Five;  Surgeon: Earnie Larsson, MD;  Location: [place];  Service: Neurosurgery;  Laterality: N/A;  Laminectomy and Foraminotomy - Lumbar Two-Lumbar Three - Lumbar Three-Lumbar Four - Lumbar Four-Lumbar Five   THORACIC DISCECTOMY Right 06/19/2021   Procedure: Microdiscectomy - right - Thoracic  Eleven-Thoracic Twelve;  Surgeon: Earnie Larsson, MD;  Location: [place];  Service: Neurosurgery;  Laterality: Right;   TONSILLECTOMY  1959   Social History   Social History   Marital Status: Married    Spouse Name: N/A   Number of Children: 2   Occupational History   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 30 tablet 0   Continuous Blood Gluc Receiver (FREESTYLE LIBRE 2 READER) DEVI 1 each by Does not apply route daily. 1 each 0   Continuous Blood Gluc Sensor (FREESTYLE LIBRE 2 SENSOR) MISC 1 each by Does not apply route every 14 (fourteen) days. 6 each 3   ferrous sulfate 325 (65 FE) MG tablet Take 1 tablet (325 mg total) by mouth daily with breakfast. 30 tablet 0   fluticasone (FLONASE) 50 MCG/ACT nasal spray Place 1 spray into both nostrils daily.     furosemide (LASIX) 40 MG tablet Take 1 tablet (40 mg total) by mouth daily. 30 tablet 2   Homeopathic Products (LIVER SUPPORT SL) Place 1 capsule under the tongue 2 (two) times daily.     insulin glargine (LANTUS SOLOSTAR) 100 UNIT/ML Solostar Pen Inject 26 Units into the skin daily. 30 mL 3   Insulin lispro (HUMALOG JUNIOR KWIKPEN) 100 UNIT/ML Inject 6-8 Units into the skin 3 (three) times daily before meals. Only with a big meal 15 mL 3   metFORMIN (GLUCOPHAGE) 1000 MG tablet TAKE 1 TABLET BY MOUTH TWICE DAILY WITH MEALS 180 tablet 0   Multiple Vitamin (MULTIVITAMIN WITH  MINERALS) TABS tablet Take 1 tablet by mouth at bedtime. One a day     pantoprazole (PROTONIX) 40 MG tablet Take 1 tablet (40 mg total) by mouth daily. 30 tablet 11   PARoxetine (PAXIL) 40 MG tablet Take 1 tablet (40 mg total) by mouth every morning. 90 tablet 3   potassium chloride SA (KLOR-CON M) 20 MEQ tablet Take 1 tablet (20 mEq total) by mouth daily. 30 tablet 2   propranolol (INDERAL) 80 MG tablet Take 80 mg by mouth 2 (two) times daily.     rosuvastatin (CRESTOR) 10 MG tablet Take 1 tablet (10 mg total) by mouth daily. 90 tablet 3   Semaglutide,0.25 or 0.'5MG'$ /DOS, (OZEMPIC, 0.25 OR 0.5 MG/DOSE,) 2 MG/1.5ML SOPN Inject into the skin. Per patient taking once weekly     sildenafil (VIAGRA) 100 MG tablet Take 1 tablet (100 mg total) by mouth daily as needed for erectile dysfunction. 10 tablet 3   spironolactone (ALDACTONE) 50 MG tablet Take 1 tablet (50 mg total) by mouth daily. 30 tablet 1   tamsulosin (FLOMAX) 0.4 MG CAPS capsule Take 2 capsules (0.8 mg total) by mouth daily. 180 capsule 3   No current facility-administered medications on file prior to visit.   No Known Allergies  Family History  Problem Relation Age of Onset   Heart failure Father        Deceased 39 y/o   Diabetes Father    Cancer Father        Prostate   Heart failure Mother        Deceased 42 y/o   Alcohol abuse Mother    Schizophrenia Son        Paranoid   Uveitis Son    Cancer Brother        prostate   Colon cancer Neg Hx    Esophageal cancer Neg Hx    Stomach cancer Neg Hx    Liver cancer Neg Hx    Pancreatic cancer Neg Hx    Rectal cancer Neg Hx  PE: BP 138/80 (BP Location: Right Arm, Patient Position: Sitting, Cuff Size: Normal)   Pulse 95   Ht '5\' 9"'$  (1.753 m)   Wt 167 lb (75.8 kg)   SpO2 95%   BMI 24.66 kg/m   Wt Readings from Last 3 Encounters:  09/17/22 167 lb (75.8 kg)  09/03/22 167 lb 6.4 oz (75.9 kg)  08/31/22 164 lb (74.4 kg)   Constitutional: normal weight, in NAD, no walker at  today's visit Eyes: no exophthalmos ENT:no thyromegaly, no cervical lymphadenopathy Cardiovascular: tachycardia, RR, No MRG, no LE edema Respiratory: CTA B Musculoskeletal: no deformities, Skin:  no rashes Neurological: + tremor with outstretched hands  ASSESSMENT: 1. DM2, insulin-dependent, uncontrolled, with complications - CAD - s/p 4 stents 2013 - seeing Dr Burt Knack - PN - Hypoglycemic seizures  Component     Latest Ref Rng 06/09/2016  C-Peptide     0.80-3.85 ng/mL 1.78  Glucose, Fasting     65 - 99 mg/dL 184 (H)  Glutamic Acid Decarb Ab     <5 IU/mL <5  Pancreatic Islet Cell Antibody     < 5 JDF Units <5   2. HL  PLAN:  1. Patient with longstanding, uncontrolled, type 2 diabetes, with history of variability in his blood sugars suggestive of insulin deficiency, on metformin, basal/bolus insulin regimen and also now weekly GLP-1 receptor agonist, added last month.  He is now obtaining this through the patient assistance program.   -His diabetes control is impaired by his continuing to drink alcohol.  He has a history of severe hypoglycemic episodes.  He is trying to stop.  No severe hypoglycemic episodes since last visit. -At last visit, he still had a lot of data loss in the CGM as he was not scanning his sensor consistently.  I advised him to scan at least every 8 hours.  Sugars were improving during the day but remained above target.  Upon questioning, he forgot about our discussion to take the Humalog before every meal and he was only taking it before larger meals, 1-2 times a week.  I advised him to take this before every meal and adjust the dose based on the size of the meal.  We did discuss that with addition of Ozempic we may need to decrease and possibly even stop NovoLog.  HbA1c at last visit was 6.2%, lower than expected from his CGM. CGM interpretation: -At today's visit, we reviewed his CGM downloads: It appears that 85% of values are in target range (goal >70%), while  9% are higher than 180 (goal <25%), and 6% are lower than 70 (goal <4%).  The calculated average blood sugar is 113.  The projected HbA1c for the next 3 months (GMI) is lower than 6%. -Reviewing the CGM trends, sugars are quite low in the normal range in the second half of the night and throughout the day but they increase around dinnertime.  At that time, he also drinks alcohol.  He does take Humalog approximately 5 units before this meal.  We discussed that for some of his dinners, he may need more.  He does not appear to need Humalog before the rest of the meals, now that he started Ozempic.  He is on the lowest dose of Ozempic, which she is tolerating well.  I do not feel we need to increase this for now. However, to avoid the risk of low blood sugars, I advised him to decrease the Lantus further. - I suggested to:  Patient Instructions  Please decrease: - Lantus to 20 units in am  Continue: - Metformin 1000 mg 2x a day, with meals - Ozempic 0.25 mg daily - Humalog 4-8 units before meals  Please return in 4 months.  - we checked his HbA1c: 5.8% (lower) - advised to check sugars at different times of the day - 4x a day, rotating check times - advised for yearly eye exams >> he is UTD - return to clinic in 4 months     2. HL -Reviewed his latest lipid panel from 06/2021: LDL above our target of less than 55 due to history of cardiovascular disease: Lab Results  Component Value Date   CHOL 135 07/14/2021   HDL 56.40 07/14/2021   LDLCALC 64 07/14/2021   LDLDIRECT 103.9 11/05/2013   TRIG 76.0 07/14/2021   CHOLHDL 2 07/14/2021  -He continues on Crestor 10 mg daily without side effects -He is due for another lipid panel >> appt with PCP pending  Philemon Kingdom, MD PhD [facility]

## 2022-10-13 ENCOUNTER — Ambulatory Visit: Payer: Medicare Other | Admitting: Internal Medicine

## 2022-10-14 ENCOUNTER — Ambulatory Visit: Payer: Medicare Other | Admitting: Internal Medicine

## 2022-10-19 ENCOUNTER — Other Ambulatory Visit: Payer: Self-pay | Admitting: Internal Medicine

## 2022-10-19 ENCOUNTER — Encounter: Payer: Medicare Other | Admitting: Internal Medicine

## 2022-10-29 ENCOUNTER — Ambulatory Visit: Payer: Medicare Other | Admitting: Internal Medicine

## 2022-11-09 ENCOUNTER — Telehealth: Payer: Self-pay

## 2022-11-09 NOTE — Telephone Encounter (Signed)
Lvm for pt advising Patient assistance Ozempic (2 boxes) has been delivered and ready for pick up

## 2022-11-26 ENCOUNTER — Ambulatory Visit (INDEPENDENT_AMBULATORY_CARE_PROVIDER_SITE_OTHER): Payer: Medicare Other | Admitting: Internal Medicine

## 2022-11-26 ENCOUNTER — Encounter: Payer: Self-pay | Admitting: Internal Medicine

## 2022-11-26 VITALS — BP 114/78 | HR 86 | Temp 97.6°F | Ht 69.0 in | Wt 168.0 lb

## 2022-11-26 DIAGNOSIS — F330 Major depressive disorder, recurrent, mild: Secondary | ICD-10-CM | POA: Diagnosis not present

## 2022-11-26 DIAGNOSIS — Z794 Long term (current) use of insulin: Secondary | ICD-10-CM

## 2022-11-26 DIAGNOSIS — L89892 Pressure ulcer of other site, stage 2: Secondary | ICD-10-CM | POA: Insufficient documentation

## 2022-11-26 DIAGNOSIS — K7030 Alcoholic cirrhosis of liver without ascites: Secondary | ICD-10-CM

## 2022-11-26 DIAGNOSIS — F1020 Alcohol dependence, uncomplicated: Secondary | ICD-10-CM

## 2022-11-26 DIAGNOSIS — E119 Type 2 diabetes mellitus without complications: Secondary | ICD-10-CM | POA: Diagnosis not present

## 2022-11-26 NOTE — Assessment & Plan Note (Signed)
Still drinking 

## 2022-11-26 NOTE — Assessment & Plan Note (Signed)
Ongoing depression with partial remission with paroxetine 40 Discussed emergency evaluation if suicidal

## 2022-11-26 NOTE — Assessment & Plan Note (Signed)
Discussed better shoe support Luckily not infected Will make urgent referral to wound center
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
## 2022-11-26 NOTE — Assessment & Plan Note (Signed)
Lab Results  Component Value Date   HGBA1C 5.8 (A) 09/17/2022   Excellent control I discussed that if he starts with hypoglycemia, we should cut back or stop the premeal insulin

## 2022-11-26 NOTE — Assessment & Plan Note (Signed)
Nothing to suggest decompensation but still drinking---urged him to stop On the propranolol 80 bid

## 2022-12-15 ENCOUNTER — Telehealth: Payer: Self-pay

## 2022-12-15 NOTE — Progress Notes (Signed)
Care Management & Coordination Services Pharmacy Team  Reason for Encounter: Appointment Reminder   Medications: Outpatient Encounter Medications as of 12/15/2022  Medication Sig   colchicine 0.6 MG tablet Take 1 tablet (0.6 mg total) by mouth daily.   Continuous Blood Gluc Receiver (FREESTYLE LIBRE 2 READER) DEVI 1 each by Does not apply route daily.   Continuous Blood Gluc Sensor (FREESTYLE LIBRE 2 SENSOR) MISC USE ONE SENSOR EVERY 14 DAYS   ferrous sulfate 325 (65 FE) MG tablet Take 1 tablet (325 mg total) by mouth daily with breakfast.   fluticasone (FLONASE) 50 MCG/ACT nasal spray Place 1 spray into both nostrils daily.   furosemide (LASIX) 40 MG tablet Take 1 tablet (40 mg total) by mouth daily.   Homeopathic Products (LIVER SUPPORT SL) Place 1 capsule under the tongue 2 (two) times daily.   insulin glargine (LANTUS SOLOSTAR) 100 UNIT/ML Solostar Pen Inject 20 Units into the skin daily.   Insulin lispro (HUMALOG JUNIOR KWIKPEN) 100 UNIT/ML Inject 4-8 Units into the skin daily before supper.   metFORMIN (GLUCOPHAGE) 1000 MG tablet Take 1 tablet (1,000 mg total) by mouth 2 (two) times daily with a meal.   Multiple Vitamin (MULTIVITAMIN WITH MINERALS) TABS tablet Take 1 tablet by mouth at bedtime. One a day   pantoprazole (PROTONIX) 40 MG tablet Take 1 tablet (40 mg total) by mouth daily.   PARoxetine (PAXIL) 40 MG tablet Take 1 tablet (40 mg total) by mouth every morning.   potassium chloride SA (KLOR-CON M) 20 MEQ tablet Take 1 tablet (20 mEq total) by mouth daily.   propranolol (INDERAL) 80 MG tablet Take 80 mg by mouth 2 (two) times daily.   rosuvastatin (CRESTOR) 10 MG tablet Take 1 tablet (10 mg total) by mouth daily.   Semaglutide,0.25 or 0.'5MG'$ /DOS, (OZEMPIC, 0.25 OR 0.5 MG/DOSE,) 2 MG/1.5ML SOPN Inject into the skin. Per patient taking once weekly   sildenafil (VIAGRA) 100 MG tablet Take 1 tablet (100 mg total) by mouth daily as needed for erectile dysfunction.   spironolactone  (ALDACTONE) 50 MG tablet Take 1 tablet (50 mg total) by mouth daily.   tamsulosin (FLOMAX) 0.4 MG CAPS capsule Take 2 capsules (0.8 mg total) by mouth daily.   No facility-administered encounter medications on file as of 12/15/2022.   Lab Results  Component Value Date/Time   HGBA1C 5.8 (A) 09/17/2022 02:46 PM   HGBA1C 6.2 (A) 06/15/2022 01:37 PM   HGBA1C 5.9 (H) 01/21/2022 02:33 PM   HGBA1C 6.8 (H) 05/21/2021 08:25 AM   MICROALBUR 2.0 (H) 07/14/2021 11:40 AM   MICROALBUR 9.1 (H) 02/11/2010 10:11 AM    BP Readings from Last 3 Encounters:  11/26/22 114/78  09/17/22 138/80  09/03/22 122/76    Unsuccessful attempt to reach patient. Left patient message reminding patient of appointment.  Patient contacted to confirm telephone appointment with Charlene Brooke, PharmD, on 12/20/2022 at 3:00.   Star Rating Drugs:  Medication:  Last Fill: Day Supply Metformin 1000 mg 10/20/2022 90 Rosuvastatin 10 mg 11/15/2022 90 Ozempic   PAP with Philemon Kingdom, MD office  Care Gaps: Annual wellness visit in last year? Yes 01/12/2022  If Diabetic: Last eye exam / retinopathy screening: 12/24/2019 Last diabetic foot exam: Up to date  Charlene Brooke, PharmD notified  Marijean Niemann, [place] Pharmacy Assistant [phone]

## 2022-12-20 ENCOUNTER — Ambulatory Visit: Payer: Medicare Other | Admitting: Pharmacist

## 2022-12-20 ENCOUNTER — Telehealth: Payer: Self-pay

## 2022-12-20 NOTE — Progress Notes (Cosign Needed)
Spoke with [employer]. They are faxing over patient's last DM eye exam which was in June.  Charlene Brooke, PharmD notified  Marijean Niemann, [place] Clinical Pharmacy Assistant [phone]

## 2022-12-20 NOTE — Progress Notes (Signed)
Care Management & Coordination Services Pharmacy Note  12/20/2022 Name:  David [name] MRN:  [id number] DOB:  1950-12-11  Summary: F/U visit -DM: A1c 5.8% (08/2022); pt wearing freestyle libre and reports BG range 110-190 most of the time, with occasional low glucose < 70 that he treats with hard candy; last low glucose was over 3 weeks ago; he is not currently using Humalog -Cirrhosis: reviewed medication use in cirrhosis, currents medications are appropriate (Child-Pugh A)  Recommendations/Changes made from today's visit: -No med changes -Advised to contact endocrine with if hypoglycemia occurs again; consider reducing Lantus -Renew Ozempic PAP for 2024 - provided pt with online info  Follow up plan: -[place] will follow up 1 months re: Ozempic PAP renewal -Pharmacist follow up televisit scheduled for 6 months -PCP appt 06/06/23; endocrine appt 01/20/23    Subjective: David [name] is an 72 y.o. year old male who is a primary patient of [name], MD.  The care coordination team was consulted for assistance with disease management and care coordination needs.    Engaged with patient by telephone for follow up visit.  Recent office visits: 11/26/22 Dr Silvio Pate OV: f/u - foot ulcer, referred to wound clinic. A1c 5.8%, if he gets hypoglycemia he should cut back or stop pre-meal insulin.  08/31/22 Dr Silvio Pate OV: nocturia - increase tamsulosin to 2 tablets.  Recent consult visits: 09/17/22 Dr Cruzita Lederer (Endo): A1c 5.8%; reduce Lantus to 20 units. Reduce Humalog to 4-8 units. AGP - TIR 85%; AVG GLU 113 09/03/22 NP Colleen Kennedy-Smith (GI): alcoholic cirrhosis. Compliant with beta blocker. No EGD indicated. 08/18/22 Dr Burt Knack (Cardiology): CAD - off antiplatelets d/t cirrosis. No changes 06/15/22 Dr Cruzita Lederer (endocrine): f/u DM; A1c 6.2%; continue Lantus 26 u, take Humalog before every meal. RTC 3 months. 03/28/22 GI pt message - reduce spironolactone to 50 mg. Repeat BMP 3  wks. 03/12/22 Dr Cruzita Lederer (Endocrine): f/u DM. A1c 6.2%; Advised to take Humalog 15 min before every meal.   Hospital visits: None in previous 6 months   Objective:  Lab Results  Component Value Date   CREATININE 1.38 09/08/2022   BUN 20 09/08/2022   GFR 50.74 (L) 09/08/2022   GFRNONAA 55 (L) 02/15/2022   GFRAA 64.14 09/22/2021   NA 137 09/08/2022   K 3.4 (L) 09/08/2022   CALCIUM 9.5 09/08/2022   CO2 34 (H) 09/08/2022   GLUCOSE 131 (H) 09/08/2022    Lab Results  Component Value Date/Time   HGBA1C 5.8 (A) 09/17/2022 02:46 PM   HGBA1C 6.2 (A) 06/15/2022 01:37 PM   HGBA1C 5.9 (H) 01/21/2022 02:33 PM   HGBA1C 6.8 (H) 05/21/2021 08:25 AM   GFR 50.74 (L) 09/08/2022 03:41 PM   GFR 63.83 08/31/2022 01:29 PM   MICROALBUR 2.0 (H) 07/14/2021 11:40 AM   MICROALBUR 9.1 (H) 02/11/2010 10:11 AM    Last diabetic Eye exam:  Lab Results  Component Value Date/Time   HMDIABEYEEXA No Retinopathy 12/24/2019 12:00 AM    Last diabetic Foot exam:  Lab Results  Component Value Date/Time   HMDIABFOOTEX done 01/12/2022 12:00 AM     Lab Results  Component Value Date   CHOL 135 07/14/2021   HDL 56.40 07/14/2021   LDLCALC 64 07/14/2021   LDLDIRECT 103.9 11/05/2013   TRIG 76.0 07/14/2021   CHOLHDL 2 07/14/2021       Latest Ref Rng & Units 08/31/2022    1:29 PM 02/15/2022    9:04 AM 01/21/2022    2:33 PM  Hepatic Function  Total  Protein 6.0 - 8.3 g/dL 7.9  7.2  7.2   Albumin 3.5 - 5.2 g/dL 4.1  3.3  3.2   AST 0 - 37 U/L 46  54  63   ALT 0 - 53 U/L 25  32  52   Alk Phosphatase 39 - 117 U/L 78  103  126   Total Bilirubin 0.2 - 1.2 mg/dL 0.9  1.0  0.8     Lab Results  Component Value Date/Time   TSH 4.496 12/26/2021 08:15 AM   TSH 1.81 11/05/2013 12:49 PM   TSH 0.90 02/16/2011 11:01 AM   FREET4 0.87 05/16/2015 09:41 AM   FREET4 0.7 02/05/2009 04:40 PM       Latest Ref Rng & Units 08/31/2022    1:29 PM 02/15/2022    9:04 AM 12/26/2021    8:15 AM  CBC  WBC 4.0 - 10.5 K/uL 5.4   4.7  6.9   Hemoglobin 13.0 - 17.0 g/dL 10.9  9.1  10.1   Hematocrit 39.0 - 52.0 % 31.3  28.1  30.5   Platelets 150.0 - 400.0 K/uL 111.0  115  122     Lab Results  Component Value Date/Time   VITAMINB12 1,959 09/15/2021 12:00 AM    Clinical ASCVD: Yes  The ASCVD Risk score (Arnett DK, et al., 2019) failed to calculate for the following reasons:   The patient has a prior MI or stroke diagnosis     Social History   Tobacco Use  Smoking Status Former   Types: Cigarettes   Quit date: 11/30/1983   Years since quitting: 39.0   Passive exposure: Never  Smokeless Tobacco Never   BP Readings from Last 3 Encounters:  11/26/22 114/78  09/17/22 138/80  09/03/22 122/76   Pulse Readings from Last 3 Encounters:  11/26/22 86  09/17/22 95  09/03/22 (!) 103   Wt Readings from Last 3 Encounters:  11/26/22 168 lb (76.2 kg)  09/17/22 167 lb (75.8 kg)  09/03/22 167 lb 6.4 oz (75.9 kg)   BMI Readings from Last 3 Encounters:  11/26/22 24.81 kg/m  09/17/22 24.66 kg/m  09/03/22 24.72 kg/m    No Known Allergies  Medications Reviewed Today     Reviewed by Charlton Haws, [place] (Pharmacist) on 12/20/22 at 1539  Med List Status: <None>   Medication Order Taking? Sig Documenting Provider Last Dose Status Informant  colchicine 0.6 MG tablet [id number] Yes Take 1 tablet (0.6 mg total) by mouth daily. Medina-Vargas, Senaida Lange, NP Taking Active Self           Med Note Rolan Bucco Dec 24, 2021  4:30 PM)    Continuous Blood Gluc Receiver (FREESTYLE LIBRE 2 READER) DEVI [id number] Yes 1 each by Does not apply route daily. Philemon Kingdom, MD Taking Active Self  Continuous Blood Gluc Sensor (FREESTYLE LIBRE 2 SENSOR) [place] [id number] Yes USE ONE SENSOR EVERY 14 DAYS Philemon Kingdom, MD Taking Active   ferrous sulfate 325 (65 FE) MG tablet [id number] Yes Take 1 tablet (325 mg total) by mouth daily with breakfast. Medina-Vargas, Monina C, NP Taking Active Self  fluticasone  (FLONASE) 50 MCG/ACT nasal spray [id number] Yes Place 1 spray into both nostrils daily. [provider] Taking Active Self  furosemide (LASIX) 40 MG tablet [id number] Yes Take 1 tablet (40 mg total) by mouth daily. Noralyn Pick, NP Taking Active   Homeopathic Products (LIVER SUPPORT SL) [id number] Yes Place 1 capsule under the tongue 2 (two)  times daily. [provider] Taking Active Self  insulin glargine (LANTUS SOLOSTAR) 100 UNIT/ML Solostar Pen [id number] Yes Inject 20 Units into the skin daily. Philemon Kingdom, MD Taking Active   Insulin lispro (HUMALOG JUNIOR KWIKPEN) 100 UNIT/ML [id number] No Inject 4-8 Units into the skin daily before supper.  Patient not taking: Reported on 12/20/2022   Philemon Kingdom, MD Not Taking Active   metFORMIN (GLUCOPHAGE) 1000 MG tablet [id number] Yes Take 1 tablet (1,000 mg total) by mouth 2 (two) times daily with a meal. Philemon Kingdom, MD Taking Active   Multiple Vitamin (MULTIVITAMIN WITH MINERALS) TABS tablet [id number] Yes Take 1 tablet by mouth at bedtime. One a day [provider] Taking Active Self  pantoprazole (PROTONIX) 40 MG tablet [id number] Yes Take 1 tablet (40 mg total) by mouth daily. Milus Banister, MD Taking Active   PARoxetine (PAXIL) 40 MG tablet [id number] Yes Take 1 tablet (40 mg total) by mouth every morning. [name], MD Taking Active   potassium chloride SA (KLOR-CON M) 20 MEQ tablet [id number] Yes Take 1 tablet (20 mEq total) by mouth daily. Jerene Bears, MD Taking Active   propranolol (INDERAL) 80 MG tablet [id number] Yes Take 80 mg by mouth 2 (two) times daily. [provider] Taking Active Self  rosuvastatin (CRESTOR) 10 MG tablet [id number] Yes Take 1 tablet (10 mg total) by mouth daily. Sherren Mocha, MD Taking Active   Semaglutide,0.25 or 0.'5MG'$ /DOS, (OZEMPIC, 0.25 OR 0.5 MG/DOSE,) 2 MG/1.5ML SOPN [id number] Yes Inject 0.5 mg into the skin once a week. Per patient taking once  weekly [provider] Taking Active   sildenafil (VIAGRA) 100 MG tablet [id number] Yes Take 1 tablet (100 mg total) by mouth daily as needed for erectile dysfunction. [name], MD Taking Active   spironolactone (ALDACTONE) 50 MG tablet [id number] Yes Take 1 tablet (50 mg total) by mouth daily. Mauri Pole, MD Taking Active   tamsulosin (FLOMAX) 0.4 MG CAPS capsule [id number] Yes Take 2 capsules (0.8 mg total) by mouth daily. [name], MD Taking Active             SDOH:  (Social Determinants of Health) assessments and interventions performed: No SDOH Interventions    Flowsheet Row Chronic Care Management from 12/24/2021 in [place] at [place] Interventions   Food Insecurity Interventions Intervention Not Indicated  Financial Strain Interventions Other (Comment)  [Pursue Ozempic PAP]       Medication Assistance:  [place] approved 07/2022 (endocrine)  Medication Access: Within the past 30 days, how often has patient missed a dose of medication? 0 Is a pillbox or other method used to improve adherence? Yes  Factors that may affect medication adherence? financial need Are meds synced by current pharmacy? No  Are meds delivered by current pharmacy? Yes  Does patient experience delays in picking up medications due to transportation concerns? No   [facility] Reviewed: Is patient disadvantaged to use [facility]?: Yes  Current Rx insurance plan: [place] Name and location of Current pharmacy:  [facility] [place], [place] - [address] [place] [address] Phone: [phone] Fax: [phone]  [facility] services reviewed with patient today?: No  Patient requests to transfer care to [facility]?: No  Reason patient declined to change pharmacies: Disadvantaged due to insurance/mail order  Compliance/Adherence/Medication fill history: Care Gaps: Eye  exam (due 11/2020) - pt reports exam in 08/2022, Dr Rick Duff. Will  request records. UACR (due 06/2022)  Star-Rating Drugs: Metformin - PDC 52%; LF 10/20/22 x 90 ds Rosuvastatin - PDC 92% Ozempic - PAP   Assessment/Plan  Hypertension (BP goal <140/90) -Controlled - BP is at goal during office visits; given comorbid cirrhosis diuretic regimen and beta blocker below are appropriate -Current treatment: Spironolactone 50 mg daily - Appropriate, Effective, Safe, Accessible Furosemide 40 mg daily -Appropriate, Effective, Safe, Accessible Propranolol 80 mg BID -Appropriate, Effective, Safe, Accessible -Medications previously tried: losartan, metoprolol -Current home readings: none, he plans to purchase a home BP monitor -Denies hypotensive/hypertensive symptoms -Educated on Importance of home blood pressure monitoring; -Counseled to monitor BP at home periodically -Recommended to continue current medication  Diabetes (A1c goal <7%) -Controlled - A1c 5.8% (08/2022), possibly over-controlled. Pt is now on Ozempic via PAP and not using Humalog  -Managed by endocrinology (Dr Cruzita Lederer) -Current home glucose readings: Freestyle Libre - range 110-190; scanning 4-5x daily -He reports occasional low glucose < 70. He uses hard candy to treat; he feels weak, "spaced out" when he has low sugars -Current medications: Metformin 1000 mg BID w/ dinner - Appropriate, Effective, Safe, Accessible Lantus 20 units daily - Appropriate, Effective, Safe, Accessible Humalog - Inject 4-8 units before meals -not using Ozempic 0.5 mg weekly (PAP) - Appropriate, Effective, Safe, Accessible Freestyle Libre 2 (reader) - Appropriate, Effective, Safe, Accessible -Medications previously tried: Research officer, trade union (cost) -Educated on A1c and blood sugar goals; Prevention and management of hypoglycemic episodes -Discussed hypoglycemia is more dangerous in short term, advised to contact endocrine if hypoglycemia occurs again -Recommend  to continue current medication; renew Ozempic PAP for 2024 - sent pt website information  Hyperlipidemia: (LDL goal < 70) -Controlled - LDL 64 (06/2021); pt endorses compliance with statin -Hx CAD -Current treatment: Rosuvastatin 10 mg daily - Appropriate, Effective, Safe, Accessible -Medications previously tried: n/a  -Educated on Cholesterol goals; Benefits of statin for ASCVD risk reduction; -Reviewed benefits of statin in cirrhosis - reduced mortality -Recommended to continue current medication  Cirrhosis (Goal: slow progression) -Stable. Follows with GI regularly. -Dx 2019, ETOH related; esophageal varices present -Child-Pugh A (calculated 12/20/22) MELD 3.0: 10 at 08/31/2022  1:29 PM MELD-Na: 8 at 08/31/2022  1:29 PM -Current treatment  Furosemide 40 mg daily - Appropriate, Effective, Safe, Accessible Spironolactone 100 mg daily - Appropriate, Effective, Safe, Accessible Propranolol 80 mg BID - Appropriate, Effective, Safe, Accessible Liver Support supplement Klor Con 20 mEq daily -Discussed benefits of diuretics and propranolol for managing complications of cirrhosis -Reviewed medication use in cirrhosis, medications are appropriate -Recommended to continue current medication  Depression (Goal: manage symptoms) -Controlled - per pt report -PHQ, GAD-7 not on file -Current treatment: Paroxetine 40 mg daily - Appropriate, Effective, Safe, Accessible -Medications previously tried/failed: venlafaxine -Educated on Benefits of medication for symptom control -Would need to be cautious with changing SSRI/SNRI in future - several are not able to be used in cirrhosis (particularly duloxetine - avoid in cirrhosis) -Recommended to continue current medication  Health Maintenance -Tamsulosin for BPH -Pantoprazole for GERD -Colchicine 0.6 mg daily for gout prevention   Charlene Brooke, PharmD, BCACP Clinical Pharmacist [facility] [phone]

## 2022-12-20 NOTE — Telephone Encounter (Signed)
-----  Message from Charlton Haws, [facility] sent at 12/20/2022  3:33 PM EST ----- Regarding: call eye dr Please call eye doctor to request records for last DM eye exam (pt says was in the fall)  Dr Rick Duff, [place].  [phone]

## 2022-12-20 NOTE — Patient Instructions (Signed)
Visit Information  Phone number for Pharmacist: [phone]  Thank you for meeting with me to discuss your medications! Below is a summary of what we talked about during the visit:   Recommendations/Changes made from today's visit: -No med changes -Advised to contact endocrine with if hypoglycemia occurs again; consider reducing Lantus -Renew Ozempic PAP for 2024  Follow up plan: -Health Concierge will follow up 1 months re: Ozempic PAP renewal -Pharmacist follow up televisit scheduled for 6 months -PCP appt 06/06/23; endocrine appt 01/20/23   Charlene Brooke, PharmD, BCACP Clinical Pharmacist [facility] [phone]

## 2022-12-21 ENCOUNTER — Ambulatory Visit
Admission: RE | Admit: 2022-12-21 | Discharge: 2022-12-21 | Disposition: A | Discharge status: Home / Self Care | Payer: Medicare Other | Source: Ambulatory Visit | Attending: Physician Assistant | Admitting: Physician Assistant

## 2022-12-21 ENCOUNTER — Other Ambulatory Visit: Payer: Self-pay | Admitting: Physician Assistant

## 2022-12-21 ENCOUNTER — Encounter: Payer: Medicare Other | Attending: Physician Assistant | Admitting: Physician Assistant

## 2022-12-21 DIAGNOSIS — Z955 Presence of coronary angioplasty implant and graft: Secondary | ICD-10-CM | POA: Diagnosis not present

## 2022-12-21 DIAGNOSIS — Z7985 Long-term (current) use of injectable non-insulin antidiabetic drugs: Secondary | ICD-10-CM | POA: Diagnosis not present

## 2022-12-21 DIAGNOSIS — M19072 Primary osteoarthritis, left ankle and foot: Secondary | ICD-10-CM | POA: Diagnosis not present

## 2022-12-21 DIAGNOSIS — E1140 Type 2 diabetes mellitus with diabetic neuropathy, unspecified: Secondary | ICD-10-CM | POA: Insufficient documentation

## 2022-12-21 DIAGNOSIS — L97512 Non-pressure chronic ulcer of other part of right foot with fat layer exposed: Secondary | ICD-10-CM | POA: Insufficient documentation

## 2022-12-21 DIAGNOSIS — S91309A Unspecified open wound, unspecified foot, initial encounter: Secondary | ICD-10-CM

## 2022-12-21 DIAGNOSIS — L97522 Non-pressure chronic ulcer of other part of left foot with fat layer exposed: Secondary | ICD-10-CM | POA: Insufficient documentation

## 2022-12-21 DIAGNOSIS — S91301A Unspecified open wound, right foot, initial encounter: Secondary | ICD-10-CM | POA: Diagnosis not present

## 2022-12-21 DIAGNOSIS — E119 Type 2 diabetes mellitus without complications: Secondary | ICD-10-CM | POA: Diagnosis not present

## 2022-12-21 DIAGNOSIS — K7030 Alcoholic cirrhosis of liver without ascites: Secondary | ICD-10-CM | POA: Insufficient documentation

## 2022-12-21 DIAGNOSIS — F10188 Alcohol abuse with other alcohol-induced disorder: Secondary | ICD-10-CM | POA: Diagnosis not present

## 2022-12-21 DIAGNOSIS — E11621 Type 2 diabetes mellitus with foot ulcer: Secondary | ICD-10-CM | POA: Insufficient documentation

## 2022-12-21 DIAGNOSIS — M19071 Primary osteoarthritis, right ankle and foot: Secondary | ICD-10-CM | POA: Diagnosis not present

## 2022-12-21 DIAGNOSIS — S91302A Unspecified open wound, left foot, initial encounter: Secondary | ICD-10-CM | POA: Diagnosis not present

## 2022-12-21 DIAGNOSIS — L97521 Non-pressure chronic ulcer of other part of left foot limited to breakdown of skin: Secondary | ICD-10-CM | POA: Diagnosis not present

## 2022-12-21 DIAGNOSIS — M7732 Calcaneal spur, left foot: Secondary | ICD-10-CM | POA: Diagnosis not present

## 2022-12-23 NOTE — Progress Notes (Signed)
ZYAIR, RUSSI ([id number]) [id number] Page 1 of 10 Visit Report for 12/21/2022 Allergy List Details Patient Name: Date of Service: David [name] MES 12/21/2022 1:30 PM Medical Record Number: [id number] Patient Account Number: [id number] Date of Birth/Sex: Treating RN: 09-29-1951 (71 y.o. Male) Cornell Barman Primary Care Audreyana Huntsberry: Viviana Simpler Other Clinician: Referring Niambi Smoak: Treating Welford Christmas/Extender: Zachery Dakins in Treatment: 0 Allergies Active Allergies No Known Drug Allergies Allergy Notes Electronic Signature(s) Signed: 12/22/2022 5:47:34 PM By: Gretta Cool, BSN, RN, CWS, Kim RN, BSN Entered By: Gretta Cool, BSN, RN, CWS, Kim on 12/21/2022 13:52:54 -------------------------------------------------------------------------------- Arrival Information Details Patient Name: Date of Service: David [name] MES 12/21/2022 1:30 PM Medical Record Number: [id number] Patient Account Number: [id number] Date of Birth/Sex: Treating RN: 09-19-1951 (71 y.o. Male) Cornell Barman Primary Care Chimere Klingensmith: Viviana Simpler Other Clinician: Referring Lavene Penagos: Treating Nicodemus Denk/Extender: Zachery Dakins in Treatment: 0 Visit Information Patient Arrived: Ambulatory Arrival Time: 13:41 Accompanied By: self Transfer Assistance: None Patient Identification Verified: Yes Secondary Verification Process Completed: Yes Patient Requires Transmission-Based Precautions: No Patient Has Alerts: Yes Patient Alerts: Type II Diabetic Electronic Signature(s) Signed: 12/22/2022 5:47:34 PM By: Gretta Cool, BSN, RN, CWS, Kim RN, BSN Entered By: Gretta Cool, BSN, RN, CWS, Kim on 12/21/2022 13:51:37 David [name] ([id number]) [id number] Page 2 of 10 -------------------------------------------------------------------------------- Clinic Level of Care Assessment Details Patient Name: Date of Service: David [name] MES 12/21/2022 1:30 PM Medical Record Number:  [id number] Patient Account Number: [id number] Date of Birth/Sex: Treating RN: 19-Sep-1951 (71 y.o. Male) Cornell Barman Primary Care Redmond Whittley: Viviana Simpler Other Clinician: Referring Karyna Bessler: Treating Ethell Blatchford/Extender: Zachery Dakins in Treatment: 0 Clinic Level of Care Assessment Items TOOL 1 Quantity Score '[]'$  - 0 Use when EandM and Procedure is performed on INITIAL visit ASSESSMENTS - Nursing Assessment / Reassessment X- 1 20 General Physical Exam (combine w/ comprehensive assessment (listed just below) when performed on new pt. evals) X- 1 25 Comprehensive Assessment (HX, ROS, Risk Assessments, Wounds Hx, etc.) ASSESSMENTS - Wound and Skin Assessment / Reassessment '[]'$  - 0 Dermatologic / Skin Assessment (not related to wound area) ASSESSMENTS - Ostomy and/or Continence Assessment and Care '[]'$  - 0 Incontinence Assessment and Management '[]'$  - 0 Ostomy Care Assessment and Management (repouching, etc.) PROCESS - Coordination of Care X - Simple Patient / Family Education for ongoing care 1 15 '[]'$  - 0 Complex (extensive) Patient / Family Education for ongoing care X- 1 10 Staff obtains Programmer, systems, Records, T Results / Process Orders est '[]'$  - 0 Staff telephones HHA, Nursing Homes / Clarify orders / etc '[]'$  - 0 Routine Transfer to another Facility (non-emergent condition) '[]'$  - 0 Routine Hospital Admission (non-emergent condition) X- 1 15 New Admissions / Biomedical engineer / Ordering NPWT Apligraf, etc. , '[]'$  - 0 Emergency Hospital Admission (emergent condition) PROCESS - Special Needs '[]'$  - 0 Pediatric / Minor Patient Management '[]'$  - 0 Isolation Patient Management '[]'$  - 0 Hearing / Language / Visual special needs '[]'$  - 0 Assessment of Community assistance (transportation, D/C planning, etc.) '[]'$  - 0 Additional assistance / Altered mentation '[]'$  - 0 Support Surface(s) Assessment (bed, cushion, seat, etc.) INTERVENTIONS - Miscellaneous '[]'$  - 0 External ear  exam '[]'$  - 0 Patient Transfer (multiple staff / Civil Service fast streamer / Similar devices) '[]'$  - 0 Simple Staple / Suture removal (25 or less) '[]'$  - 0 Complex Staple / Suture removal (26 or more) David [name] ([id number]) [id number] Page 3 of 10 '[]'$  - 0 Hypo/Hyperglycemic Management (do not check if billed separately)  X- 1 15 Ankle / Brachial Index (ABI) - do not check if billed separately Has the patient been seen at the hospital within the last three years: Yes Total Score: 100 Level Of Care: New/Established - Level 3 Electronic Signature(s) Signed: 12/22/2022 5:47:34 PM By: Gretta Cool, BSN, RN, CWS, Kim RN, BSN Entered By: Gretta Cool, BSN, RN, CWS, Kim on 12/21/2022 16:52:07 -------------------------------------------------------------------------------- Encounter Discharge Information Details Patient Name: Date of Service: David [name] MES 12/21/2022 1:30 PM Medical Record Number: [id number] Patient Account Number: [id number] Date of Birth/Sex: Treating RN: Feb 04, 1951 (71 y.o. Male) Cornell Barman Primary Care Vinny Taranto: Viviana Simpler Other Clinician: Referring Telia Amundson: Treating Emilynn Srinivasan/Extender: Zachery Dakins in Treatment: 0 Encounter Discharge Information Items Post Procedure Vitals Discharge Condition: Stable Temperature (F): 98.2 Ambulatory Status: Ambulatory Pulse (bpm): 86 Discharge Destination: Home Respiratory Rate (breaths/min): 16 Transportation: Other Blood Pressure (mmHg): 149/81 Accompanied By: self Schedule Follow-up Appointment: Yes Clinical Summary of Care: Electronic Signature(s) Signed: 12/22/2022 5:47:34 PM By: Gretta Cool, BSN, RN, CWS, Kim RN, BSN Entered By: Gretta Cool, BSN, RN, CWS, Kim on 12/21/2022 15:14:10 -------------------------------------------------------------------------------- Lower Extremity Assessment Details Patient Name: Date of Service: David [name] MES 12/21/2022 1:30 PM Medical Record Number: [id number] Patient Account  Number: [id number] Date of Birth/Sex: Treating RN: October 15, 1951 (71 y.o. Male) Cornell Barman Primary Care Yashua Bracco: Viviana Simpler Other Clinician: Referring Yeira Gulden: Treating Quincey Nored/Extender: Blain Pais, Richard Weeks in Treatment: 0 Edema Assessment Assessed: [Left: No] Patrice Paradise: No] [Left: Edema] Patrice Paradise: :] Mammie LorenzoVernia Buff ([id number])] [Right: [id number] Page 4 of 10] Calf Left: Right: Point of Measurement: 30 cm From Medial Instep 33.5 cm 31.2 cm Ankle Left: Right: Point of Measurement: 10 cm From Medial Instep 23 cm 21 cm Vascular Assessment Pulses: Dorsalis Pedis Palpable: [Left:Yes] [Right:Yes] Doppler Audible: [Left:Yes] [Right:Yes] Posterior Tibial Palpable: [Left:Yes] [Right:Yes] Doppler Audible: [Left:Yes] [Right:Yes] Blood Pressure: Brachial: [Right:128] Dorsalis Pedis: 140 [Left:Dorsalis Pedis: 150] Ankle: Posterior Tibial: 164 [Left:Posterior Tibial: 150 1.28] [Right:1.17] Electronic Signature(s) Signed: 12/22/2022 5:47:34 PM By: Gretta Cool, BSN, RN, CWS, Kim RN, BSN Entered By: Gretta Cool, BSN, RN, CWS, Kim on 12/21/2022 14:26:12 -------------------------------------------------------------------------------- Multi Wound Chart Details Patient Name: Date of Service: David [name] MES 12/21/2022 1:30 PM Medical Record Number: [id number] Patient Account Number: [id number] Date of Birth/Sex: Treating RN: [birth year] (71 y.o. Male) Cornell Barman Primary Care Tenaya Hilyer: Viviana Simpler Other Clinician: Referring Kaz Auld: Treating Dawnita Molner/Extender: Blain Pais, Anabel Bene in Treatment: 0 Vital Signs Height(in): 69 Pulse(bpm): 86 Weight(lbs): 161 Blood Pressure(mmHg): 149/81 Body Mass Index(BMI): 23.8 Temperature(F): 98.2 Respiratory Rate(breaths/min): 16 [1:Photos:] [N/A:N/A] Left, Lateral, Plantar Metatarsal head Right, Lateral, Plantar Foot N/A Wound Location: fifth Gradually Appeared Trauma N/A Wounding  Event: Diabetic Wound/Ulcer of the Lower Diabetic Wound/Ulcer of the Lower N/A Primary Etiology: Extremity Extremity Sleep Apnea, Myocardial Infarction, Sleep Apnea, Myocardial Infarction, N/A Comorbid HistoryGRAISON, David [name] ([id number]) [id number] Page 5 of 10 Cirrhosis , Type II Diabetes, Gout, Cirrhosis , Type II Diabetes, Gout, Osteoarthritis, Neuropathy Osteoarthritis, Neuropathy 09/29/2022 08/23/2022 N/A Date Acquired: 0 0 N/A Weeks of Treatment: Open Open N/A Wound Status: No No N/A Wound Recurrence: 1.5x1.6x0.3 0.5x0.4x0.4 N/A Measurements L x W x D (cm) 1.885 0.157 N/A A (cm) : rea 0.565 0.063 N/A Volume (cm) : 12 2 Starting Position 1 (o'clock): 12 7 Ending Position 1 (o'clock): 0.6 0.9 Maximum Distance 1 (cm): Yes Yes N/A Undermining: Grade 1 Grade 1 N/A Classification: Medium Medium N/A Exudate A mount: Serous Sanguinous N/A Exudate Type: amber red N/A Exudate Color: Flat and Intact Flat and Intact N/A Wound Margin:  Medium (34-66%) None Present (0%) N/A Granulation A mount: Pale, Hyper-granulation N/A N/A Granulation Quality: Small (1-33%) None Present (0%) N/A Necrotic A mount: Fat Layer (Subcutaneous Tissue): Yes Fat Layer (Subcutaneous Tissue): Yes N/A Exposed Structures: Fascia: No Fascia: No Tendon: No Tendon: No Muscle: No Muscle: No Joint: No Joint: No Bone: No Bone: No None N/A N/A Epithelialization: N/A callus surrounding the wound. unable toN/A Assessment Notes: assess wound bed due to size of opening, Treatment Notes Electronic Signature(s) Signed: 12/22/2022 5:47:34 PM By: Gretta Cool, BSN, RN, CWS, Kim RN, BSN Entered By: Gretta Cool, BSN, RN, CWS, Kim on 12/21/2022 14:39:56 -------------------------------------------------------------------------------- Pain Assessment Details Patient Name: Date of Service: David [name] MES 12/21/2022 1:30 PM Medical Record Number: [id number] Patient Account Number:  [id number] Date of Birth/Sex: Treating RN: Aug 14, 1951 (71 y.o. Male) Cornell Barman Primary Care Addiel Mccardle: Viviana Simpler Other Clinician: Referring Candy Ziegler: Treating Nishaan Stanke/Extender: Zachery Dakins in Treatment: 0 Active Problems Location of Pain Severity and Description of Pain Patient Has Paino No Site Locations David [name], David [name] ([id number]) [id number] Page 6 of 10 Pain Management and Medication Current Pain Management: Notes some pain when walking. Electronic Signature(s) Signed: 12/22/2022 5:47:34 PM By: Gretta Cool, BSN, RN, CWS, Kim RN, BSN Entered By: Gretta Cool, BSN, RN, CWS, Kim on 12/21/2022 13:52:00 -------------------------------------------------------------------------------- Patient/Caregiver Education Details Patient Name: Date of Service: David [name] MES 1/23/2024andnbsp1:30 PM Medical Record Number: [id number] Patient Account Number: [id number] Date of Birth/Gender: Treating RN: Mar 07, 1951 (71 y.o. Male) Cornell Barman Primary Care Physician: Viviana Simpler Other Clinician: Referring Physician: Treating Physician/Extender: Zachery Dakins in Treatment: 0 Education Assessment Education Provided To: Patient Education Topics Provided Wound Debridement: Handouts: Wound Debridement Methods: Explain/Verbal Responses: State content correctly Wound/Skin Impairment: Handouts: Caring for Your Ulcer Methods: Explain/Verbal Responses: State content correctly Electronic Signature(s) Signed: 12/22/2022 5:47:34 PM By: Gretta Cool, BSN, RN, CWS, Kim RN, BSN Entered By: Gretta Cool, BSN, RN, CWS, Kim on 12/21/2022 15:10:11 David [name] ([id number]) [id number] Page 7 of 10 -------------------------------------------------------------------------------- Wound Assessment Details Patient Name: Date of Service: David [name] MES 12/21/2022 1:30 PM Medical Record Number: [id number] Patient Account Number:  [id number] Date of Birth/Sex: Treating RN: [birth year] (71 y.o. Male) Cornell Barman Primary Care Nashira Mcglynn: Viviana Simpler Other Clinician: Referring Brinkley Peet: Treating Ragan Reale/Extender: Zachery Dakins in Treatment: 0 Wound Status Wound Number: 1 Primary Diabetic Wound/Ulcer of the Lower Extremity Etiology: Wound Location: Left, Lateral, Plantar Metatarsal head fifth Wound Open Wounding Event: Gradually Appeared Status: Date Acquired: 09/29/2022 Comorbid Sleep Apnea, Myocardial Infarction, Cirrhosis , Type II Diabetes, Weeks Of Treatment: 0 History: Gout, Osteoarthritis, Neuropathy Clustered Wound: No Photos Wound Measurements Length: (cm) 1.5 Width: (cm) 1.6 Depth: (cm) 0.3 Area: (cm) 1.885 Volume: (cm) 0.565 % Reduction in Area: % Reduction in Volume: Epithelialization: None Tunneling: No Undermining: Yes Starting Position (o'clock): 12 Ending Position (o'clock): 12 Maximum Distance: (cm) 0.6 Wound Description Classification: Grade 1 Wound Margin: Flat and Intact Exudate Amount: Medium Exudate Type: Serous Exudate Color: amber Foul Odor After Cleansing: No Slough/Fibrino No Wound Bed Granulation Amount: Medium (34-66%) Exposed Structure Granulation Quality: Pale, Hyper-granulation Fascia Exposed: No Necrotic Amount: Small (1-33%) Fat Layer (Subcutaneous Tissue) Exposed: Yes Necrotic Quality: Adherent Slough Tendon Exposed: No Muscle Exposed: No Joint Exposed: No Bone Exposed: No Treatment Notes David [name], David [name] ([id number]) [id number] Page 8 of 10 Wound #1 (Metatarsal head fifth) Wound Laterality: Plantar, Left, Lateral Cleanser [employer]ram Ancillary Kit - 15 Day Supply Discharge Instruction: Use supplies as instructed; Kit contains: (15) Saline Bullets; (15) 3x3 Gauze; 15 pr Gloves  Soap and Water Discharge Instruction: Gently cleanse wound with antibacterial soap, rinse and pat dry prior to dressing wounds Peri-Wound  Care Topical Primary Dressing Hydrofera Blue Ready Transfer Foam, 2.5x2.5 (in/in) Discharge Instruction: Apply Hydrofera Blue Ready to wound bed as directed Secondary Dressing T Adhesive [employer], 4x4 (in/in) elfa Discharge Instruction: Apply over dressing to secure in place. Secured With Compression Wrap Compression Stockings Environmental education officer) Signed: 12/22/2022 5:47:34 PM By: Gretta Cool, BSN, RN, CWS, Kim RN, BSN Entered By: Gretta Cool, BSN, RN, CWS, Kim on 12/21/2022 14:13:23 -------------------------------------------------------------------------------- Wound Assessment Details Patient Name: Date of Service: David [name] MES 12/21/2022 1:30 PM Medical Record Number: [id number] Patient Account Number: [id number] Date of Birth/Sex: Treating RN: 27-Aug-1951 (71 y.o. Male) Cornell Barman Primary Care Cariah Salatino: Viviana Simpler Other Clinician: Referring Tanea Moga: Treating Naquisha Whitehair/Extender: Zachery Dakins in Treatment: 0 Wound Status Wound Number: 2 Primary Diabetic Wound/Ulcer of the Lower Extremity Etiology: Wound Location: Right, Lateral, Plantar Foot Wound Open Wounding Event: Trauma Status: Date Acquired: 08/23/2022 Comorbid Sleep Apnea, Myocardial Infarction, Cirrhosis , Type II Diabetes, Weeks Of Treatment: 0 History: Gout, Osteoarthritis, Neuropathy Clustered Wound: No Photos David [name], David [name] ([id number]) [id number] Page 9 of 10 Photo Uploaded By: Gretta Cool, BSN, RN, CWS, Kim on 12/21/2022 14:36:51 Wound Measurements Length: (cm) 0.6 Width: (cm) 0.7 Depth: (cm) 0.5 Area: (cm) 0.33 Volume: (cm) 0.165 % Reduction in Area: % Reduction in Volume: Undermining: Yes Starting Position (o'clock): 2 Ending Position (o'clock): 7 Maximum Distance: (cm) 0.9 Wound Description Classification: Grade 1 Wound Margin: Flat and Intact Exudate Amount: Medium Exudate Type: Sanguinous Exudate Color: red Foul Odor After Cleansing:  No Slough/Fibrino Yes Wound Bed Granulation Amount: None Present (0%) Exposed Structure Necrotic Amount: None Present (0%) Fascia Exposed: No Fat Layer (Subcutaneous Tissue) Exposed: Yes Tendon Exposed: No Muscle Exposed: No Joint Exposed: No Bone Exposed: No Assessment Notes callus surrounding the wound. unable to assess wound bed due to size of opening, Treatment Notes Wound #2 (Foot) Wound Laterality: Plantar, Right, Lateral Cleanser [employer]ram Ancillary Kit - 15 Day Supply Discharge Instruction: Use supplies as instructed; Kit contains: (15) Saline Bullets; (15) 3x3 Gauze; 15 pr Gloves Soap and Water Discharge Instruction: Gently cleanse wound with antibacterial soap, rinse and pat dry prior to dressing wounds Peri-Wound Care Topical Primary Dressing Hydrofera Blue Ready Transfer Foam, 2.5x2.5 (in/in) Discharge Instruction: Apply Hydrofera Blue Ready to wound bed as directed Secondary Dressing T Adhesive [employer], 4x4 (in/in) elfa Discharge Instruction: Apply over dressing to secure in place. Secured With Compression Wrap Compression Stockings Environmental education officer) Signed: 12/22/2022 5:47:34 PM By: Gretta Cool, BSN, RN, CWS, Kim RN, BSN Entered By: Gretta Cool, BSN, RN, CWS, Kim on 12/21/2022 14:42:04 David [name] ([id number]) [id number] Page 10 of 10 -------------------------------------------------------------------------------- Vitals Details Patient Name: Date of Service: David [name] MES 12/21/2022 1:30 PM Medical Record Number: [id number] Patient Account Number: [id number] Date of Birth/Sex: Treating RN: Dec 12, 1950 (71 y.o. Male) Cornell Barman Primary Care Aaleyah Witherow: Viviana Simpler Other Clinician: Referring Amrom Ore: Treating Jewelz Kobus/Extender: Zachery Dakins in Treatment: 0 Vital Signs Time Taken: 13:52 Temperature (F): 98.2 Height (in): 69 Pulse (bpm): 86 Source: Measured Respiratory Rate (breaths/min):  16 Weight (lbs): 161 Blood Pressure (mmHg): 149/81 Source: Measured Reference Range: 80 - 120 mg / dl Body Mass Index (BMI): 23.8 Electronic Signature(s) Signed: 12/22/2022 5:47:34 PM By: Gretta Cool, BSN, RN, CWS, Kim RN, BSN Entered By: Gretta Cool, BSN, RN, CWS, Kim on 12/21/2022 13:52:42

## 2022-12-23 NOTE — Progress Notes (Signed)
David [name] ([id number]) [id number] Page 1 of 12 Visit Report for 12/21/2022 Chief Complaint Document Details Patient Name: Date of Service: David [name] MES 12/21/2022 1:30 PM Medical Record Number: [id number] Patient Account Number: [id number] Date of Birth/Sex: Treating RN: 01-24-1951 (72 y.o. Male) Cornell Barman Primary Care Provider: Viviana Simpler Other Clinician: Referring Provider: Treating Provider/Extender: Zachery Dakins in Treatment: 0 Information Obtained from: Patient Chief Complaint Bilateral foot ulcers Electronic Signature(s) Signed: 12/21/2022 2:32:41 PM By: Worthy Keeler PA-C Entered By: Worthy Keeler on 12/21/2022 14:32:41 -------------------------------------------------------------------------------- Debridement Details Patient Name: Date of Service: David [name] MES 12/21/2022 1:30 PM Medical Record Number: [id number] Patient Account Number: [id number] Date of Birth/Sex: Treating RN: 1951/08/16 (72 y.o. Male) Cornell Barman Primary Care Provider: Viviana Simpler Other Clinician: Referring Provider: Treating Provider/Extender: Zachery Dakins in Treatment: 0 Debridement Performed for Assessment: Wound #2 Right,Lateral,Plantar Foot Performed By: Physician Tommie Sams., PA-C Debridement Type: Debridement Severity of Tissue Pre Debridement: Fat layer exposed Level of Consciousness (Pre-procedure): Awake and Alert Pre-procedure Verification/Time Out Yes - 14:40 Taken: T Area Debrided (L x W): otal 0.5 (cm) x 0.4 (cm) = 0.2 (cm) Tissue and other material debrided: Viable, Non-Viable, Callus, Slough, Subcutaneous, Slough Level: Skin/Subcutaneous Tissue Debridement Description: Excisional Instrument: Curette Bleeding: Minimum Hemostasis Achieved: Pressure Response to Treatment: Procedure was tolerated well Level of Consciousness (Post- Awake and Alert procedure): Post Debridement Measurements of  Total Wound David [name] ([id number]) [id number] Page 2 of 12 Length: (cm) 0.6 Width: (cm) 0.7 Depth: (cm) 0.5 Volume: (cm) 0.165 Character of Wound/Ulcer Post Debridement: Stable Severity of Tissue Post Debridement: Fat layer exposed Post Procedure Diagnosis Same as Pre-procedure Electronic Signature(s) Signed: 12/21/2022 5:22:14 PM By: Worthy Keeler PA-C Signed: 12/22/2022 5:47:34 PM By: Gretta Cool, BSN, RN, CWS, Kim RN, BSN Entered By: Gretta Cool, BSN, RN, CWS, Kim on 12/21/2022 14:41:33 -------------------------------------------------------------------------------- Debridement Details Patient Name: Date of Service: David [name] MES 12/21/2022 1:30 PM Medical Record Number: [id number] Patient Account Number: [id number] Date of Birth/Sex: Treating RN: 16-Oct-1951 (72 y.o. Male) Cornell Barman Primary Care Provider: Viviana Simpler Other Clinician: Referring Provider: Treating Provider/Extender: Zachery Dakins in Treatment: 0 Debridement Performed for Assessment: Wound #1 Left,Lateral,Plantar Metatarsal head fifth Performed By: Physician Tommie Sams., PA-C Debridement Type: Debridement Severity of Tissue Pre Debridement: Fat layer exposed Level of Consciousness (Pre-procedure): Awake and Alert Pre-procedure Verification/Time Out Yes - 14:40 Taken: T Area Debrided (L x W): otal 1.5 (cm) x 1.6 (cm) = 2.4 (cm) Tissue and other material debrided: Viable, Non-Viable, Callus, Slough, Subcutaneous, Slough Level: Skin/Subcutaneous Tissue Debridement Description: Excisional Instrument: Curette Bleeding: Minimum Hemostasis Achieved: Pressure Response to Treatment: Procedure was tolerated well Level of Consciousness (Post- Awake and Alert procedure): Post Debridement Measurements of Total Wound Length: (cm) 1.5 Width: (cm) 1.6 Depth: (cm) 0.4 Volume: (cm) 0.754 Character of Wound/Ulcer Post Debridement: Stable Severity of Tissue Post  Debridement: Limited to breakdown of skin Post Procedure Diagnosis Same as Pre-procedure Electronic Signature(s) Signed: 12/21/2022 5:22:14 PM By: Worthy Keeler PA-C Signed: 12/22/2022 5:47:34 PM By: Gretta Cool, BSN, RN, CWS, Kim RN, BSN Entered By: Gretta Cool, BSN, RN, CWS, Kim on 12/21/2022 14:43:06 David [name] ([id number]) [id number] Page 3 of 12 -------------------------------------------------------------------------------- HPI Details Patient Name: Date of Service: David [name] MES 12/21/2022 1:30 PM Medical Record Number: [id number] Patient Account Number: [id number] Date of Birth/Sex: Treating RN: [birth year] (72 y.o. Male) Cornell Barman Primary Care Provider: Viviana Simpler Other Clinician: Referring Provider: Treating Provider/Extender: Blain Pais,  Richard Weeks in Treatment: 0 History of Present Illness HPI Description: 12-21-2022 patient presents today for initial evaluation here in the clinic concerning an issue that he has been having with wounds over the bilateral plantar feet. The right seems to be less severe than the left. With that being said he tells me that he is diabetic he is currently on Ozempic. His blood sugars tend to stay pretty good and his most recent hemoglobin A1c was on September 17, 2022 was measured at 5.8. In regard to medications he is on Lasix and spironolactone to help with the fluid control and seems to be doing well he also wears compression socks which is good news as well. Currently the patient does have a wound on the right foot which has been present for 4 months after the patient had some callus he tried to pull off and caused a wound. The left foot has been open since November 2023 but the patient is unsure how exactly this happened both seem to be over areas of bony prominence. He currently has been using what sounds to be more of just a triple antibiotic ointment and covering these although he tells me it is hard  time keeping the dressings in place as when he takes his compression socks off the dressings to come off as well. 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 He also in March 2010 had a coronary artery stent. Electronic Signature(s) Signed: 12/21/2022 5:02:35 PM By: Worthy Keeler PA-C Entered By: Worthy Keeler on 12/21/2022 17:02:35 -------------------------------------------------------------------------------- Physical Exam Details Patient Name: Date of Service: David [name] MES 12/21/2022 1:30 PM Medical Record Number: [id number] Patient Account Number: [id number] Date of Birth/Sex: Treating RN: Jun 28, 1951 (71 y.o. Male) Cornell Barman Primary Care Provider: Viviana Simpler Other Clinician: Referring Provider: Treating Provider/Extender: Zachery Dakins in Treatment: 0 Constitutional patient is hypertensive.. pulse regular and within target range for patient.[name] respirations regular, non-labored and within target range for patient.[name] temperature within target range for patient.. Well-nourished and well-hydrated in no acute distress. Eyes conjunctiva clear no eyelid edema noted. pupils equal round and reactive to light and accommodation. Ears, Nose, Mouth, and Throat no gross abnormality of ear auricles or external auditory canals. normal hearing noted during conversation. mucus membranes moist. Respiratory normal breathing without difficulty. David [name], David [name] ([id number]) [id number] Page 4 of 12 Cardiovascular 2+ dorsalis pedis/posterior tibialis pulses. no clubbing, cyanosis, significant edema, <3 sec cap refill. Musculoskeletal normal gait and posture. no significant deformity or arthritic changes, no loss or range of motion, no clubbing. Psychiatric this patient is able to make decisions and demonstrates good  insight into disease process. Alert and Oriented x 3. pleasant and cooperative. Notes Upon inspection patient's wound bed actually showed signs at both feet of having some significant callus buildup the right was worse than the left in general. Fortunately however there did not appear to be any signs of infection right now which is good news he has not had any x-rays of this area as of yet and I would likely send him to get an x-ray today he is in agreement with that plan. Subsequently I did also recommend for the patient that we probably need to clean up the surface of the wounds as well in order to get things moving in the right direction. He does seem to have good  arterial flow ABIs 1.28 on the left 1.17 on the right. Electronic Signature(s) Signed: 12/21/2022 5:03:20 PM By: Worthy Keeler PA-C Entered By: Worthy Keeler on 12/21/2022 17:03:20 -------------------------------------------------------------------------------- Physician Orders Details Patient Name: Date of Service: David [name] MES 12/21/2022 1:30 PM Medical Record Number: [id number] Patient Account Number: [id number] Date of Birth/Sex: Treating RN: [birth year] (71 y.o. 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 Nurse Visit as needed Bathing/ Shower/ Hygiene May shower; gently cleanse wound with antibacterial soap, rinse and pat dry prior to  dressing wounds Anesthetic (Use 'Patient Medications' Section for Anesthetic Order Entry) Lidocaine applied to wound bed Edema Control - Lymphedema / Segmental Compressive Device / Other Bilateral Lower Extremities Tubigrip single layer applied. - Tubi D Elevate, Exercise Daily and A void Standing for Long Periods of Time. Elevate legs to the level of the heart and pump ankles as often as possible Elevate leg(s) parallel to the floor when sitting. Additional Orders / Instructions Follow Nutritious Diet and Increase Protein Intake Activity as tolerated Wound Treatment David [name] ([id number]) [id number] Page 5 of 12 Wound #1 - Metatarsal head fifth Wound Laterality: Plantar, Left, Lateral Cleanser: Byram Ancillary Kit - 15 Day Supply 3 x Per Week/30 Days Discharge Instructions: Use supplies as instructed; Kit contains: (15) Saline Bullets; (15) 3x3 Gauze; 15 pr Gloves Cleanser: Soap and Water 3 x Per Week/30 Days Discharge Instructions: Gently cleanse wound with antibacterial soap, rinse and pat dry prior to dressing wounds Prim Dressing: Hydrofera Blue Ready Transfer Foam, 2.5x2.5 (in/in) (DME) (Dispense As Written) 3 x Per Week/30 Days ary Discharge Instructions: Apply Hydrofera Blue Ready to wound bed as directed Secondary Dressing: [place] Dressing, 4x4 (in/in) (DME) (Generic) 3 x Per Week/30 Days elfa Discharge Instructions: Apply over dressing to secure in place. Wound #2 - Foot Wound Laterality: Plantar, Right, Lateral Cleanser: Byram Ancillary Kit - 15 Day Supply 3 x Per Week/30 Days Discharge Instructions: Use supplies as instructed; Kit contains: (15) Saline Bullets; (15) 3x3 Gauze; 15 pr Gloves Cleanser: Soap and Water 3 x Per Week/30 Days Discharge Instructions: Gently cleanse wound with antibacterial soap, rinse and pat dry prior to dressing wounds Prim Dressing: Hydrofera Blue Ready Transfer Foam, 2.5x2.5 (in/in) (DME) (Dispense As  Written) 3 x Per Week/30 Days ary Discharge Instructions: Apply Hydrofera Blue Ready to wound bed as directed Secondary Dressing: [place] Dressing, 4x4 (in/in) (DME) (Generic) 3 x Per Week/30 Days elfa Discharge Instructions: Apply over dressing to secure in place. Radiology X-ray, foot - Right 3-view Non-healing wound lateral foot X-ray, foot left - Left 3-view non-healing wound lateral 5th met-head Electronic Signature(s) Signed: 12/21/2022 5:22:14 PM By: Worthy Keeler PA-C Signed: 12/22/2022 5:47:34 PM By: Gretta Cool, BSN, RN, CWS, Kim RN, BSN Entered By: Gretta Cool, BSN, RN, CWS, Kim on 12/21/2022 14:59:39 Prescription 12/21/2022 -------------------------------------------------------------------------------- Joaquin Courts PA-C Patient Name: Provider: Apr 21, 1951 [id number] Date of Birth: NPI#: Male [id number] Sex: DEA #: [phone] Phone #: License #: [place] Patient Address:  [facility] [place], [place] [address] [address] [place], [place] [address] [phone] Allergies No Known Drug Allergies Provider's Orders X-ray, foot - Right 3-view Non-healing wound lateral foot Hand Signature: Date(s): PEARL, BERLINGER ([id number]) [id number] Page 6 of 12 Prescription 12/21/2022 Joaquin Courts PA-C Patient Name: Provider: Jun 22, 1951 [id number] Date of Birth: NPI#: Male [id number] Sex: DEA #: [phone] Phone #: License #: [place] Patient Address: [facility] [place], [place] [address] [address], [place], [place] [address] [phone] Allergies No Known Drug Allergies Provider's Orders X-ray, foot left - Left 3-view non-healing wound lateral 5th met-head Hand Signature: Date(s): Electronic Signature(s) Signed: 12/21/2022 5:22:14 PM By:  Worthy Keeler PA-C Signed: 12/22/2022 5:47:34 PM By: Gretta Cool, BSN, RN, CWS, Kim RN, BSN Entered By: Gretta Cool, BSN, RN, CWS, Kim on 12/21/2022 14:59:39 -------------------------------------------------------------------------------- Problem List Details Patient Name: Date of Service: David [name] MES 12/21/2022 1:30 PM Medical Record Number: [id number] Patient Account Number: [id number] Date of Birth/Sex: Treating RN: Jul 02, 1951 (71 y.o. 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 12/21/2022 No Yes David [name], David [name] ([id number]) [id number] Page 7 of 12 Inactive Problems Resolved Problems Electronic Signature(s) Signed: 12/21/2022 2:35:15 PM By: Worthy Keeler PA-C Previous Signature: 12/21/2022 2:31:30 PM Version By: Worthy Keeler PA-C Entered By: Worthy Keeler on 12/21/2022 14:35:15 -------------------------------------------------------------------------------- Progress Note Details Patient Name: Date of Service: David [name] MES 12/21/2022 1:30 PM Medical Record Number: [id number] Patient Account Number: [id number] Date of Birth/Sex: Treating RN: 1951-11-18 (71 y.o. Male) Cornell Barman Primary Care Provider: Viviana Simpler Other Clinician: Referring Provider: Treating Provider/Extender: Zachery Dakins in Treatment: 0 Subjective Chief  Complaint Information obtained from Patient Bilateral foot ulcers History of Present Illness (HPI) 12-21-2022 patient presents today for initial evaluation here in the clinic concerning an issue that he has been having with wounds over the bilateral plantar feet. The right seems to be less severe than the left. With that being said he tells me that he is diabetic he is currently on Ozempic. His blood sugars tend to stay pretty good and his most recent hemoglobin A1c was on September 17, 2022 was measured at 5.8. In regard to medications he is on Lasix and spironolactone to help with the fluid control and seems to be doing well he also wears compression socks which is good news as well. Currently the patient does have a wound on the right foot which has been present for 4 months after the patient had some callus he tried to pull off and caused a wound. The left foot has been open since November 2023 but the patient is unsure how exactly this happened both seem to be over areas of bony prominence. He currently has been using what sounds to be more of just a triple antibiotic ointment and covering these although he tells me it is hard time keeping the dressings in place  as when he takes his compression socks off the dressings to come off as well. 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 He also in March 2010 had a coronary artery stent. Patient History Information obtained from Patient, Chart. Allergies No Known Drug Allergies Social History Former smoker, Marital Status - Divorced, Alcohol Use - Moderate, Drug Use - No History, Caffeine Use - Never. Medical History Hematologic/Lymphatic Denies history of Anemia Respiratory Patient has history of Sleep Apnea Cardiovascular Patient has history of Myocardial Infarction - stents Denies history of  Hypertension Gastrointestinal Patient has history of Cirrhosis Endocrine Patient has history of Type II Diabetes Musculoskeletal Patient has history of Gout, Osteoarthritis Neurologic Patient has history of Neuropathy David [name], David [name] ([id number]) [id number] Page 8 of 12 Patient is treated with Insulin, Oral Agents. Blood sugar is tested. Blood sugar results noted at the following times: Breakfast - 150. Review of Systems (ROS) Constitutional Symptoms (General Health) Denies complaints or symptoms of Fatigue, Fever, Chills, Marked Weight Change. Eyes Denies complaints or symptoms of Dry Eyes, Vision Changes, Glasses / Contacts. Ear/Nose/Mouth/Throat Denies complaints or symptoms of Difficult clearing ears, Sinusitis. Respiratory Denies complaints or symptoms of Chronic or frequent coughs, Shortness of Breath. Cardiovascular Complains or has symptoms of LE edema. Genitourinary Denies complaints or symptoms of Kidney failure/ Dialysis, Incontinence/dribbling. Immunological Denies complaints or symptoms of Hives, Itching. Integumentary (Skin) Complains or has symptoms of Wounds, Bleeding or bruising tendency. Objective Constitutional patient is hypertensive.. pulse regular and within target range for patient.[name] respirations regular, non-labored and within target range for patient.[name] temperature within target range for patient.. Well-nourished and well-hydrated in no acute distress. Vitals Time Taken: 1:52 PM, Height: 69 in, Source: Measured, Weight: 161 lbs, Source: Measured, BMI: 23.8, Temperature: 98.2 F, Pulse: 86 bpm, Respiratory Rate: 16 breaths/min, Blood Pressure: 149/81 mmHg. Eyes conjunctiva clear no eyelid edema noted. pupils equal round and reactive to light and accommodation. Ears, Nose, Mouth, and Throat no gross abnormality of ear auricles or external auditory canals. normal hearing noted during conversation. mucus membranes  moist. Respiratory normal breathing without difficulty. Cardiovascular 2+ dorsalis pedis/posterior tibialis pulses. no clubbing, cyanosis, significant edema, Musculoskeletal normal gait and posture. no significant deformity or arthritic changes, no loss or range of motion, no clubbing. Psychiatric this patient is able to make decisions and demonstrates good insight into disease process. Alert and Oriented x 3. pleasant and cooperative. General Notes: Upon inspection patient's wound bed actually showed signs at both feet of having some significant callus buildup the right was worse than the left in general. Fortunately however there did not appear to be any signs of infection right now which is good news he has not had any x-rays of this area as of yet and I would likely send him to get an x-ray today he is in agreement with that plan. Subsequently I did also recommend for the patient that we probably need to clean up the surface of the wounds as well in order to get things moving in the right direction. He does seem to have good arterial flow ABIs 1.28 on the left 1.17 on the right. Integumentary (Hair, Skin) Wound #1 status is Open. Original cause of wound was Gradually Appeared. The date acquired was: 09/29/2022. The wound is located on the Left,Lateral,Plantar Metatarsal head fifth. The wound measures 1.5cm length x 1.6cm width x 0.3cm depth; 1.885cm^2  area and 0.565cm^3 volume. There is Fat Layer (Subcutaneous Tissue) exposed. There is no tunneling noted, however, there is undermining starting at 12:00 and ending at 12:00 with a maximum distance of 0.6cm. There is a medium amount of serous drainage noted. The wound margin is flat and intact. There is medium (34-66%) pale, hyper - granulation within the wound bed. There is a small (1-33%) amount of necrotic tissue within the wound bed including Adherent Slough. Wound #2 status is Open. Original cause of wound was Trauma. The date acquired was:  08/23/2022. The wound is located on the [place]. The wound measures 0.6cm length x 0.7cm width x 0.5cm depth; 0.33cm^2 area and 0.165cm^3 volume. There is Fat Layer (Subcutaneous Tissue) exposed. There is undermining starting at 2:00 and ending at 7:00 with a maximum distance of 0.9cm. There is a medium amount of sanguinous drainage noted. The wound margin is flat and intact. There is no granulation within the wound bed. There is no necrotic tissue within the wound bed. General Notes: callus surrounding the wound. unable to assess wound bed due to size of opening, Assessment Active Problems ICD-10 Type 2 diabetes mellitus with foot ulcer Non-pressure chronic ulcer of other part of left foot with fat layer exposed Non-pressure chronic ulcer of other part of right foot with fat layer exposed Alcoholic cirrhosis of liver without ascites Alcohol abuse with other alcohol-induced disorder David [name], David [name] ([id number]) [id number] Page 9 of 12 Procedures Wound #1 Pre-procedure diagnosis of Wound #1 is a Diabetic Wound/Ulcer of the Lower Extremity located on the Left,Lateral,Plantar Metatarsal head fifth .Severity of Tissue Pre Debridement is: Fat layer exposed. There was a Excisional Skin/Subcutaneous Tissue Debridement with a total area of 2.4 sq cm performed by Tommie Sams., PA-C. With the following instrument(s): Curette to remove Viable and Non-Viable tissue/material. Material removed includes Callus, Subcutaneous Tissue, and Slough. No specimens were taken. A time out was conducted at 14:40, prior to the start of the procedure. A Minimum amount of bleeding was controlled with Pressure. The procedure was tolerated well. Post Debridement Measurements: 1.5cm length x 1.6cm width x 0.4cm depth; 0.754cm^3 volume. Character of Wound/Ulcer Post Debridement is stable. Severity of Tissue Post Debridement is: Limited to breakdown of skin. Post procedure  Diagnosis Wound #1: Same as Pre-Procedure Wound #2 Pre-procedure diagnosis of Wound #2 is a Diabetic Wound/Ulcer of the Lower Extremity located on the Right,Lateral,Plantar Foot .Severity of Tissue Pre Debridement is: Fat layer exposed. There was a Excisional Skin/Subcutaneous Tissue Debridement with a total area of 0.2 sq cm performed by Tommie Sams., PA-C. With the following instrument(s): Curette to remove Viable and Non-Viable tissue/material. Material removed includes Callus, Subcutaneous Tissue, and Slough. No specimens were taken. A time out was conducted at 14:40, prior to the start of the procedure. A Minimum amount of bleeding was controlled with Pressure. The procedure was tolerated well. Post Debridement Measurements: 0.6cm length x 0.7cm width x 0.5cm depth; 0.165cm^3 volume. Character of Wound/Ulcer Post Debridement is stable. Severity of Tissue Post Debridement is: Fat layer exposed. Post procedure Diagnosis Wound #2: Same as Pre-Procedure Plan Follow-up Appointments: Return Appointment in 1 week. Nurse Visit as needed Bathing/ Shower/ Hygiene: May shower; gently cleanse wound with antibacterial soap, rinse and pat dry prior to dressing wounds Anesthetic (Use 'Patient Medications' Section for Anesthetic Order Entry): Lidocaine applied to wound bed Edema Control - Lymphedema / Segmental Compressive Device / Other: Tubigrip single layer applied. - Tubi D Elevate, Exercise Daily and Avoid Standing for Long Periods  of Time. Elevate legs to the level of the heart and pump ankles as often as possible Elevate leg(s) parallel to the floor when sitting. Additional Orders / Instructions: Follow Nutritious Diet and Increase Protein Intake Activity as tolerated Radiology ordered were: X-ray, foot - Right 3-view Non-healing wound lateral foot, X-ray, foot left - Left 3-view non-healing wound lateral 5th met-head WOUND #1: - Metatarsal head fifth Wound Laterality: Plantar, Left,  Lateral Cleanser: Byram Ancillary Kit - 15 Day Supply 3 x Per Week/30 Days Discharge Instructions: Use supplies as instructed; Kit contains: (15) Saline Bullets; (15) 3x3 Gauze; 15 pr Gloves Cleanser: Soap and Water 3 x Per Week/30 Days Discharge Instructions: Gently cleanse wound with antibacterial soap, rinse and pat dry prior to dressing wounds Prim Dressing: Hydrofera Blue Ready Transfer Foam, 2.5x2.5 (in/in) (DME) (Dispense As Written) 3 x Per Week/30 Days ary Discharge Instructions: Apply Hydrofera Blue Ready to wound bed as directed Secondary Dressing: T Adhesive [employer], 4x4 (in/in) (DME) (Generic) 3 x Per Week/30 Days elfa Discharge Instructions: Apply over dressing to secure in place. WOUND #2: - Foot Wound Laterality: Plantar, Right, Lateral Cleanser: Byram Ancillary Kit - 15 Day Supply 3 x Per Week/30 Days Discharge Instructions: Use supplies as instructed; Kit contains: (15) Saline Bullets; (15) 3x3 Gauze; 15 pr Gloves Cleanser: Soap and Water 3 x Per Week/30 Days Discharge Instructions: Gently cleanse wound with antibacterial soap, rinse and pat dry prior to dressing wounds Prim Dressing: Hydrofera Blue Ready Transfer Foam, 2.5x2.5 (in/in) (DME) (Dispense As Written) 3 x Per Week/30 Days ary Discharge Instructions: Apply Hydrofera Blue Ready to wound bed as directed Secondary Dressing: [place] Dressing, 4x4 (in/in) (DME) (Generic) 3 x Per Week/30 Days elfa Discharge Instructions: Apply over dressing to secure in place. 1. Based on what I am seeing currently I do believe that the patient would benefit currently from going ahead and initiating treatment with a Hydrofera Blue dressing followed by bordered foam dressing to cover. 2. I am also can recommend based on what I am seeing currently that the patient should benefit from offloading and although I did not discuss with him in great detail offloading shoes today I think that is can be somewhat difficult short  of doing a total contact cast I want to make sure there were not seeing any signs of infection as far as any bony abnormalities on the x-rays and if not then we may discuss in greater detail the possibility of initiating total contact cast for 1 side and once we get 1 side healed head to the other side. We will see patient back for reevaluation in 1 week here in the clinic. If anything worsens or changes patient will contact our office for additional recommendations. Electronic Signature(s) Signed: 12/21/2022 5:04:17 PM By: Worthy Keeler PA-C Entered By: Worthy Keeler on 12/21/2022 17:04:17 David [name] ([id number]) [id number] Page 10 of 12 -------------------------------------------------------------------------------- ROS/PFSH Details Patient Name: Date of Service: David [name] MES 12/21/2022 1:30 PM Medical Record Number: [id number] Patient Account Number: [id number] Date of Birth/Sex: Treating RN: Aug 28, 1951 (71 y.o. Male) Cornell Barman Primary Care Provider: Viviana Simpler Other Clinician: Referring Provider: Treating Provider/Extender: Zachery Dakins in Treatment: 0 Information Obtained From Patient Chart Constitutional Symptoms (General Health) Complaints and Symptoms: Negative for: Fatigue; Fever; Chills; Marked Weight Change Eyes Complaints and Symptoms: Negative for: Dry Eyes; Vision Changes; Glasses / Contacts Ear/Nose/Mouth/Throat Complaints and Symptoms: Negative for: Difficult clearing ears; Sinusitis Respiratory Complaints and Symptoms: Negative for: Chronic or  frequent coughs; Shortness of Breath Medical History: Positive for: Sleep Apnea Cardiovascular Complaints and Symptoms: Positive for: LE edema Medical History: Positive for: Myocardial Infarction - stents Negative for: Hypertension Genitourinary Complaints and Symptoms: Negative for: Kidney failure/ Dialysis;  Incontinence/dribbling Immunological Complaints and Symptoms: Negative for: Hives; Itching Integumentary (Skin) Complaints and Symptoms: Positive for: Wounds; Bleeding or bruising tendency Hematologic/Lymphatic Medical History: Negative for: Anemia Gastrointestinal David [name], David [name] ([id number]) [id number] Page 11 of 12 Medical History: Positive for: Cirrhosis Endocrine Medical History: Positive for: Type II Diabetes Time with diabetes: 10 Treated with: Insulin, Oral agents Blood sugar tested every day: Yes Tested : Blood sugar testing results: Breakfast: 150 Musculoskeletal Medical History: Positive for: Gout; Osteoarthritis Neurologic Medical History: Positive for: Neuropathy Immunizations Pneumococcal Vaccine: Received Pneumococcal Vaccination: Yes Received Pneumococcal Vaccination On or After 60th Birthday: Yes Implantable Devices None Family and Social History Former smoker; Marital Status - Divorced; Alcohol Use: Moderate; Drug Use: No History; Caffeine Use: Never Electronic Signature(s) Signed: 12/21/2022 5:22:14 PM By: Worthy Keeler PA-C Signed: 12/22/2022 5:47:34 PM By: Gretta Cool, BSN, RN, CWS, Kim RN, BSN Entered By: Gretta Cool, BSN, RN, CWS, Kim on 12/21/2022 13:58:40 -------------------------------------------------------------------------------- [place]a Details Patient Name: Date of Service: David [name] MES 12/21/2022 Medical Record Number: [id number] Patient Account Number: [id number] Date of Birth/Sex: Treating RN: May 05, 1951 (71 y.o. 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621 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

## 2022-12-23 NOTE — Progress Notes (Signed)
DEAARON, [name] ([id number]) [id number] Page 1 of 5 Visit Report for 12/21/2022 Abuse Risk Screen Details Patient Name: Date of Service: David [name] MES 12/21/2022 1:30 PM Medical Record Number: [id number] Patient Account Number: [id number] Date of Birth/Sex: Treating RN: [birth year] (71 y.o. Male) David [name] Primary Care David [name]: David [name] Other Clinician: Referring David [name]: Treating David [name]/Extender: David [name] in Treatment: 0 Abuse Risk Screen Items Answer ABUSE RISK SCREEN: Has anyone close to you tried to hurt or harm you recentlyo No Do you feel uncomfortable with anyone in your familyo No Has anyone forced you do things that you didnt want to doo No Electronic Signature(s) Signed: 12/22/2022 5:47:34 PM By: David [name], BSN, RN, CWS, Kim RN, BSN Entered By: David [name], BSN, RN, CWS, Kim on 12/21/2022 13:58:48 -------------------------------------------------------------------------------- Activities of Daily Living Details Patient Name: Date of Service: David [name] MES 12/21/2022 1:30 PM Medical Record Number: [id number] Patient Account Number: [id number] Date of Birth/Sex: Treating RN: David [name] (71 y.o. Male) David [name] Primary Care David [name]: David [name] Other Clinician: Referring David [name]: Treating David [name]/Extender: David [name] in Treatment: 0 Activities of Daily Living Items Answer Activities of Daily Living (Please select one for each item) Drive Automobile Completely Able T Medications ake Completely Able Use T elephone Completely Able Care for Appearance Completely Able Use T oilet Completely Able Bath / Shower Completely Able Dress Self Completely Able Feed Self Completely Able Walk Completely Able Get In / Out Bed Completely Able Housework Completely Able FODAY, CONE ([id number]) [phone] [id number] Page 2 of 5 Prepare Meals Completely Able Handle Money  Completely Able Shop for Self Completely Able Electronic Signature(s) Signed: 12/22/2022 5:47:34 PM By: David [name], BSN, RN, CWS, Kim RN, BSN Entered By: David [name], BSN, RN, CWS, Kim on 12/21/2022 13:59:08 -------------------------------------------------------------------------------- Education Screening Details Patient Name: Date of Service: David [name] MES 12/21/2022 1:30 PM Medical Record Number: [id number] Patient Account Number: [id number] Date of Birth/Sex: Treating RN: [name] (71 y.o. Male) David [name] Primary Care David [name]: David [name] Other Clinician: Referring Arien Benincasa: Treating David [name]/Extender: David [name] in Treatment: 0 Primary Learner Assessed: Patient Learning Preferences/Education Level/Primary Language Learning Preference: Explanation, Demonstration Highest Education Level: College or Above Preferred Language: English Cognitive Barrier Language Barrier: No Translator Needed: No Memory Deficit: No Emotional Barrier: No Physical Barrier Impaired Vision: No Impaired Hearing: No Decreased Hand dexterity: No Knowledge/Comprehension Knowledge Level: High Comprehension Level: High Ability to understand written instructions: High Ability to understand verbal instructions: High Motivation Anxiety Level: Calm Cooperation: Cooperative Education Importance: Acknowledges Need Interest in Health Problems: Asks Questions Perception: Coherent Willingness to Engage in Self-Management High Activities: Readiness to Engage in Self-Management High Activities: Electronic Signature(s) Signed: 12/22/2022 5:47:34 PM By: David [name], BSN, RN, CWS, Kim RN, BSN Entered By: David [name], BSN, RN, CWS, Kim on 12/21/2022 13:59:30 David [name] ([id number]) [phone] [id number] Page 3 of 5 -------------------------------------------------------------------------------- Fall Risk Assessment Details Patient Name: Date of Service: David [name] MES  12/21/2022 1:30 PM Medical Record Number: [id number] Patient Account Number: [id number] Date of Birth/Sex: Treating RN: David [birth year], [name] (71 y.o. Male) David [name] Primary Care David [name]: David [name] Other Clinician: Referring David [name]: Treating David [name]/Extender: David [name] in Treatment: 0 Fall Risk Assessment Items Have you had 2 or more falls in the last 12 monthso 0 Yes Have you had any fall that resulted in injury in the last 12 monthso 0 No FALLS RISK SCREEN History of falling - immediate or within 3 months 25 Yes Secondary diagnosis (Do you have 2 or  more medical diagnoseso) 0 No Ambulatory aid None/bed rest/wheelchair/nurse 0 No Crutches/cane/walker 15 Yes Furniture 0 No Intravenous therapy Access/Saline/Heparin Lock 0 No Gait/Transferring Normal/ bed rest/ wheelchair 0 No Weak (short steps with or without shuffle, stooped but able to lift head while walking, may seek 10 Yes support from furniture) Impaired (short steps with shuffle, may have difficulty arising from chair, head down, impaired 0 No balance) Mental Status Oriented to own ability 0 Yes Electronic Signature(s) Signed: 12/22/2022 5:47:34 PM By: David [name], BSN, RN, CWS, Kim RN, BSN Entered By: David [name], BSN, RN, CWS, Kim on 12/21/2022 14:00:43 -------------------------------------------------------------------------------- Foot Assessment Details Patient Name: Date of Service: David [name] MES 12/21/2022 1:30 PM Medical Record Number: [id number] Patient Account Number: [id number] Date of Birth/Sex: Treating RN: David [name] (71 y.o. Male) David [name] Primary Care David [name]: David [name] Other Clinician: Referring David [name]: Treating David [name]/Extender: David [name] in Treatment: 0 Foot Assessment Items Site Locations SOL, ENGLERT ([id number]) [phone] [id number] Page 4 of 5 + = Sensation present, - = Sensation absent, C = Callus, U = Ulcer R = Redness, W  = Warmth, M = Maceration, PU = Pre-ulcerative lesion F = Fissure, S = Swelling, D = Dryness Assessment Right: Left: Other Deformity: No No Prior Foot Ulcer: No No Prior Amputation: No No Charcot Joint: No No Ambulatory Status: Ambulatory Without Help Gait: Unsteady Notes Patient uses a walker at home. Electronic Signature(s) Signed: 12/22/2022 5:47:34 PM By: David [name], BSN, RN, CWS, Kim RN, BSN Entered By: David [name], BSN, RN, CWS, Kim on 12/21/2022 14:06:13 -------------------------------------------------------------------------------- Nutrition Risk Screening Details Patient Name: Date of Service: David [name] MES 12/21/2022 1:30 PM Medical Record Number: [id number] Patient Account Number: [id number] Date of Birth/Sex: Treating RN: [birth year] (71 y.o. Male) David [name] Primary Care Rowen Wilmer: David [name] Other Clinician: Referring Aisley Whan: Treating Dagmawi Venable/Extender: David [name] in Treatment: 0 Height (in): 69 Weight (lbs): 161 Body Mass Index (BMI): 23.8 Nutrition Risk Screening Items Score Screening NUTRITION RISK SCREEN: I have an illness or condition that made me change the kind and/or amount of food I eat 0 No I eat fewer than two meals per day 0 No I eat few fruits and vegetables, or milk products 0 No David [name] ([id number]) [phone] [id number] Page 5 of 5 I have three or more drinks of beer, liquor or wine almost every day 0 No I have tooth or mouth problems that make it hard for me to eat 0 No I don't always have enough money to buy the food I need 0 No I eat alone most of the time 0 No I take three or more different prescribed or over-the-counter drugs a day 1 Yes Without wanting to, I have lost or gained 10 pounds in the last six months 0 No I am not always physically able to shop, cook and/or feed myself 0 No Nutrition Protocols Good Risk Protocol 0 No interventions needed Moderate Risk Protocol High Risk  Proctocol Risk Level: Good Risk Score: 1 Electronic Signature(s) Signed: 12/22/2022 5:47:34 PM By: David [name], BSN, RN, CWS, Kim RN, BSN Entered By: David [name], BSN, RN, CWS, Kim on 12/21/2022 14:01:20

## 2022-12-28 ENCOUNTER — Ambulatory Visit: Payer: Medicare Other | Admitting: Physician Assistant

## 2022-12-29 ENCOUNTER — Telehealth: Payer: Self-pay | Admitting: Internal Medicine

## 2022-12-29 ENCOUNTER — Telehealth: Payer: Self-pay | Admitting: Cardiovascular Disease

## 2022-12-30 NOTE — Telephone Encounter (Signed)
Officer Robina Ade from [place] police called to ask if Dr Silvio Pate could sign off on the death certificate for patient? He would like a phone call.

## 2022-12-30 NOTE — Telephone Encounter (Signed)
Called and spoke to [employer]. Time of Death/Pronounced Time were same 1:46 pm. I advised him it may be tomorrow morning before this is done.

## 2022-12-30 NOTE — Telephone Encounter (Signed)
Pamella Pert with [facility] is calling wanting to know if Dr. Burt Knack is able to sign the death certificate for this patient. Please advise.

## 2022-12-30 NOTE — Telephone Encounter (Signed)
I will take care of the death certificate once it comes through

## 2022-12-30 DEATH — deceased

## 2023-01-20 ENCOUNTER — Ambulatory Visit: Payer: Medicare Other | Admitting: Internal Medicine

## 2023-01-28 IMAGING — CR DG LUMBAR SPINE 2-3V
2 series · 2 of 2 positions shown · non-contrast
Comparison: MRI November 15, 2020.

CLINICAL DATA: Surgical localization.

EXAM:
LUMBAR SPINE - 2-3 VIEW

[lateral (1 of 2)]
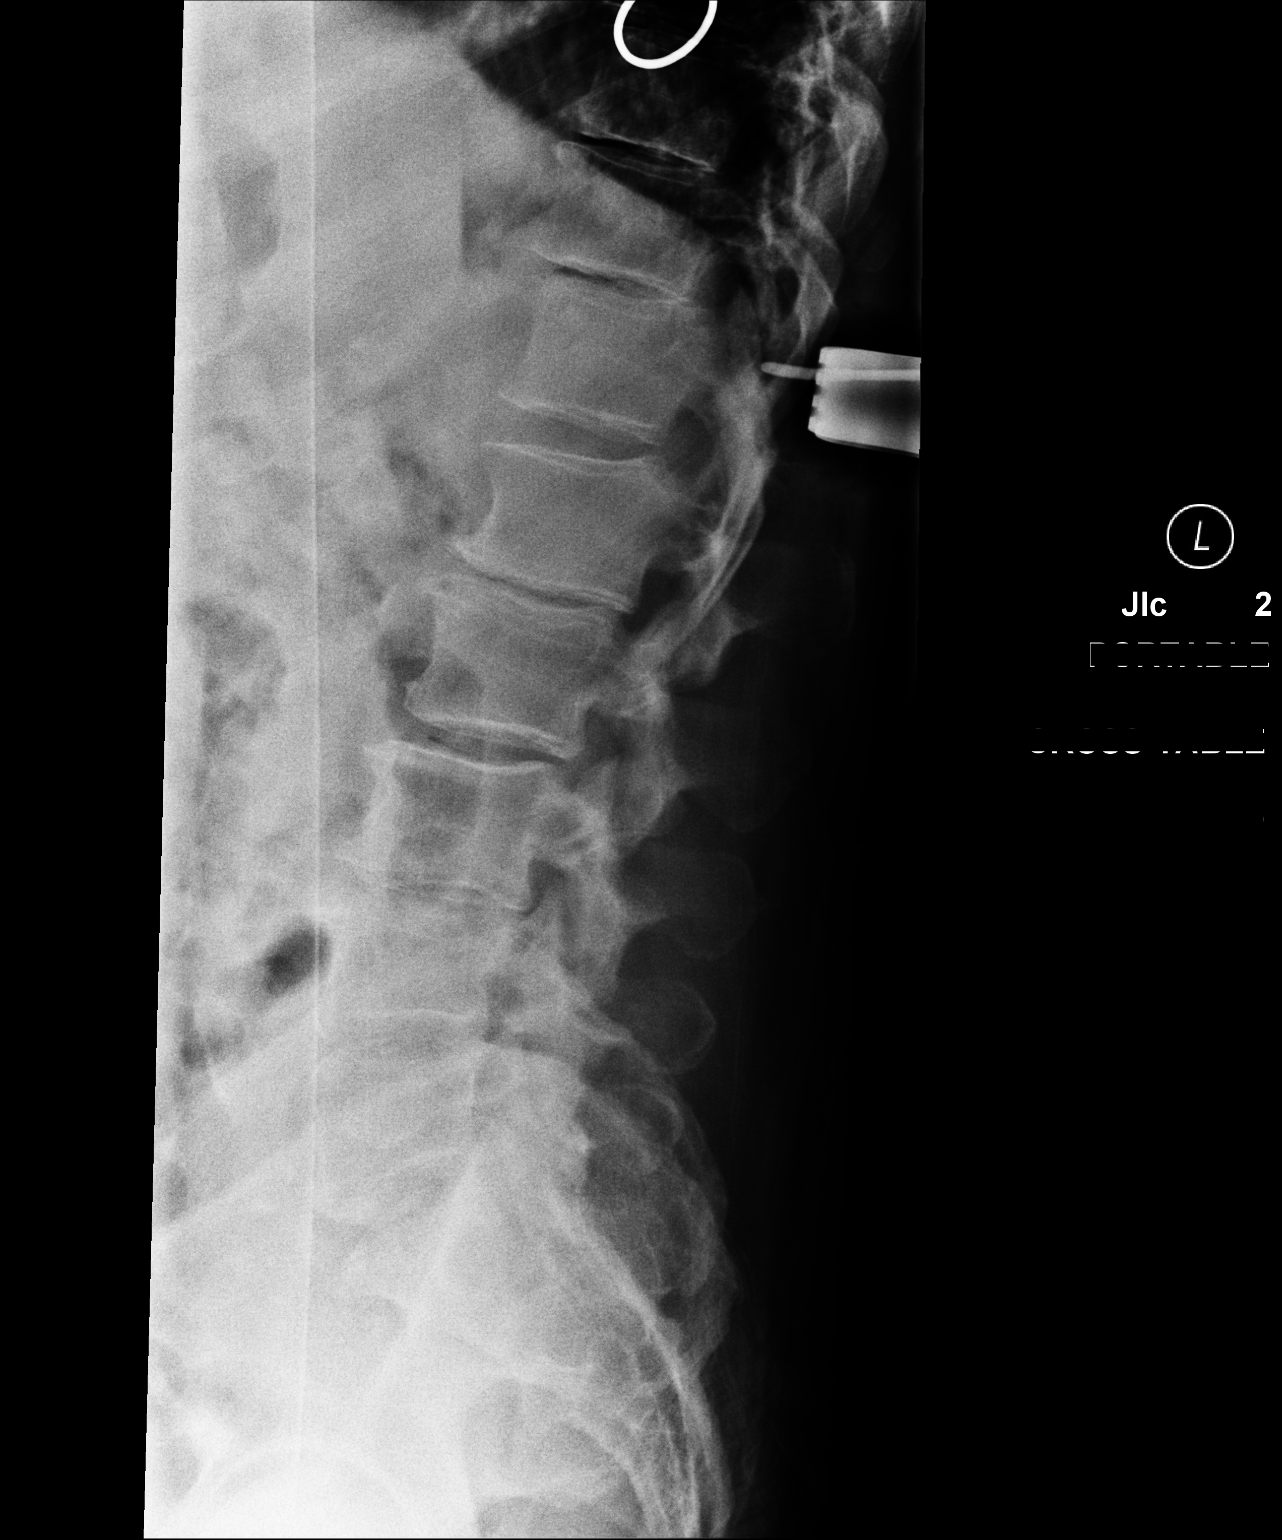

[lateral (2 of 2)]
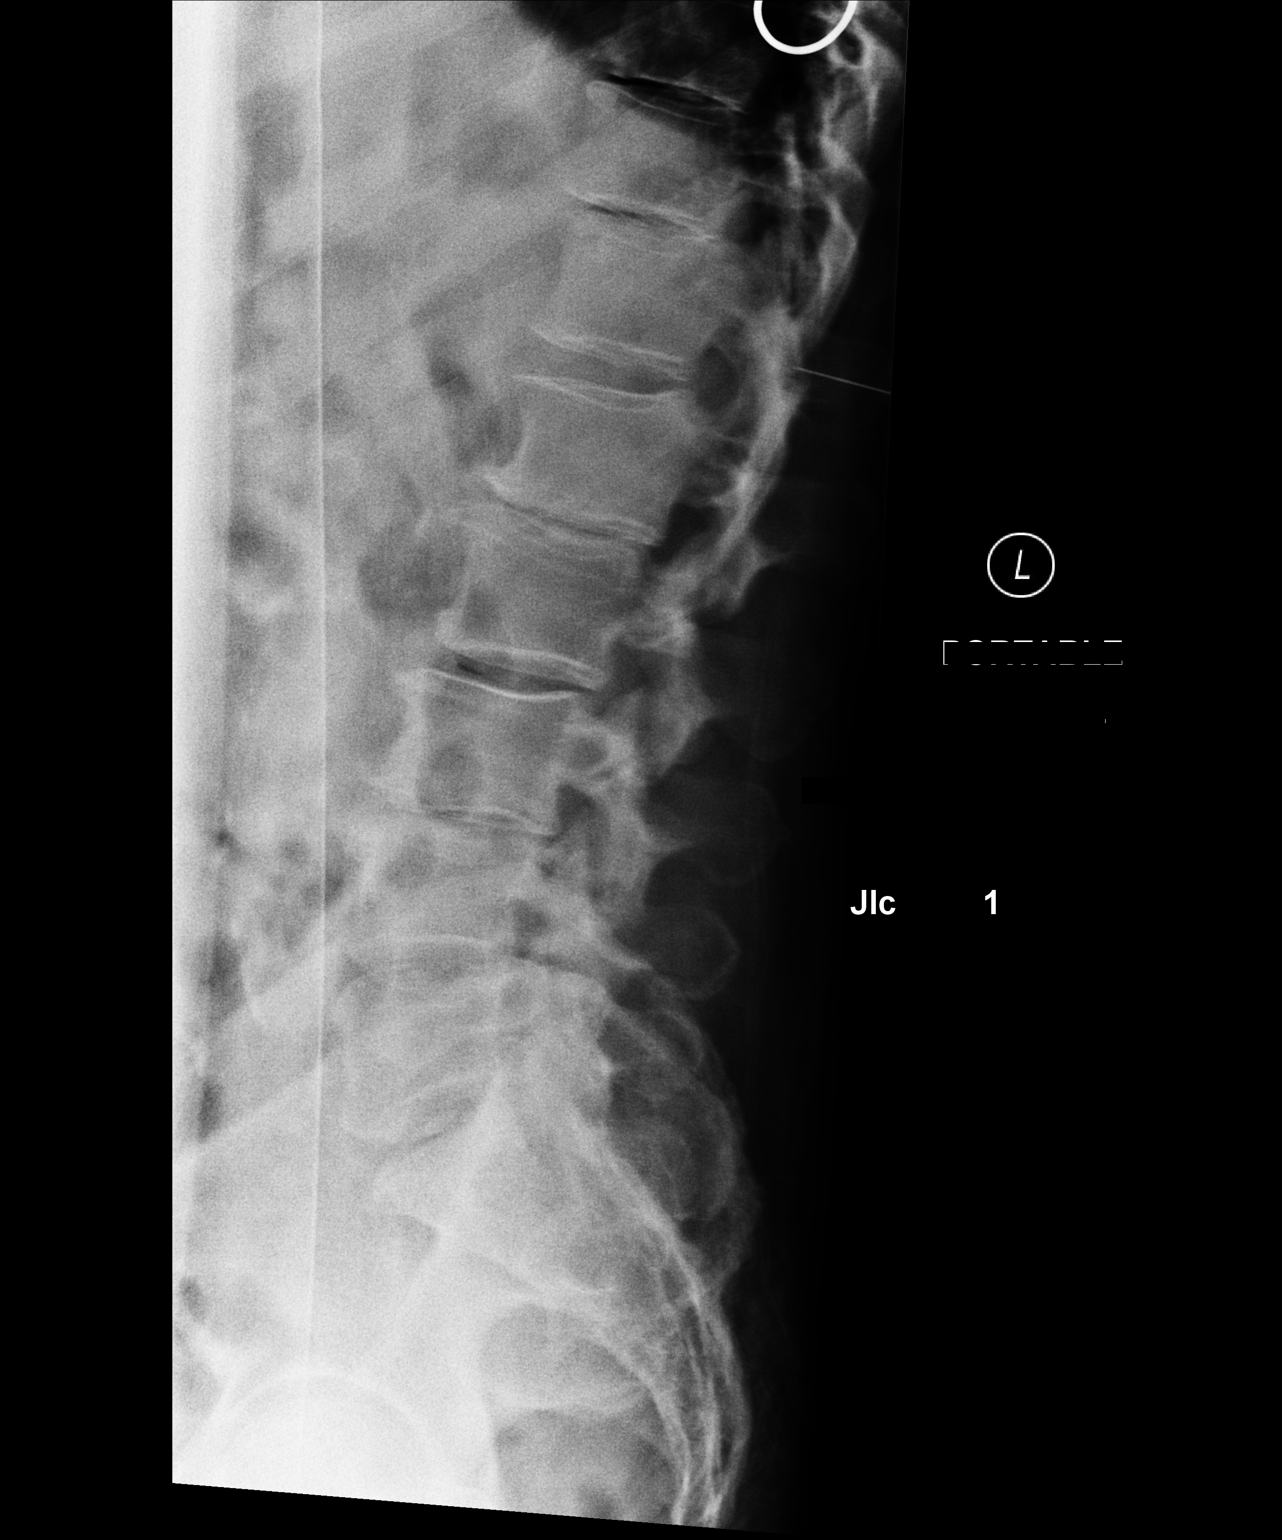

[2 of 2 positions shown; findings below may reference images not displayed]

FINDINGS: Two intraoperative cross-table lateral projections were obtained of
the lumbar spine. The first image demonstrates a surgical probe
directed toward the posterior elements of T12. The second image
demonstrates surgical probe at approximately the T12-L1 level.
IMPRESSION: Surgical localization as described above.

## 2023-04-16 IMAGING — CR DG KNEE COMPLETE 4+V*R*
1 series · 4 of 4 positions shown · non-contrast
Comparison: Knee series 11/05/2013.

CLINICAL DATA: 70-year-old male status post falls.  Pain.

EXAM:
RIGHT KNEE - COMPLETE 4+ VIEW

[Series 1: dg knee complete 4 views right · 0.14mm/px · 4 of 4 slices shown]
[im 1/4]
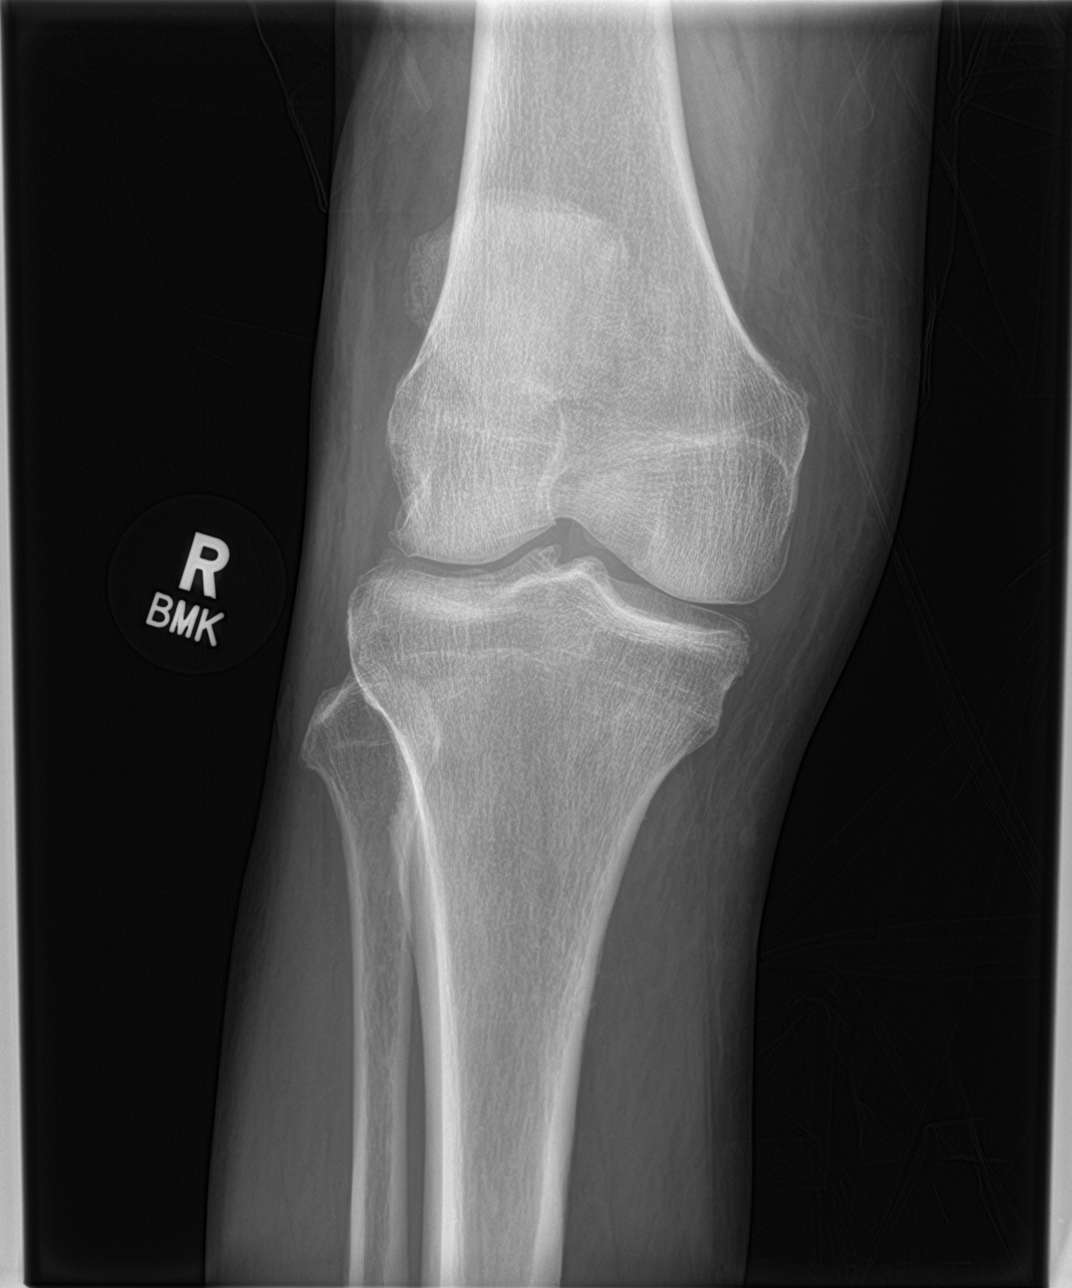
[im 2/4]
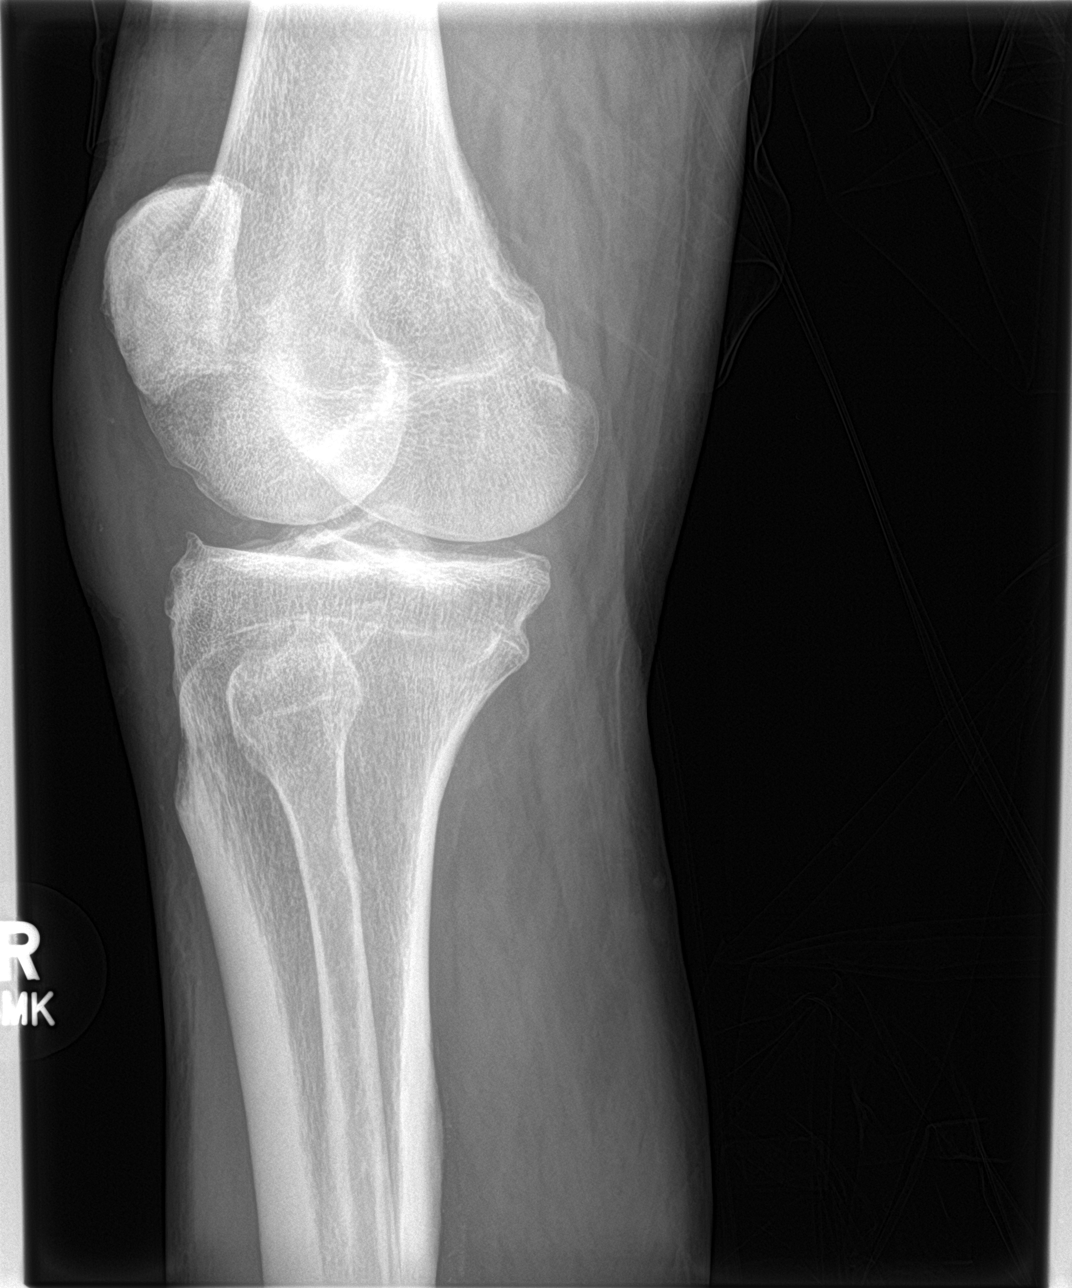
[im 3/4]
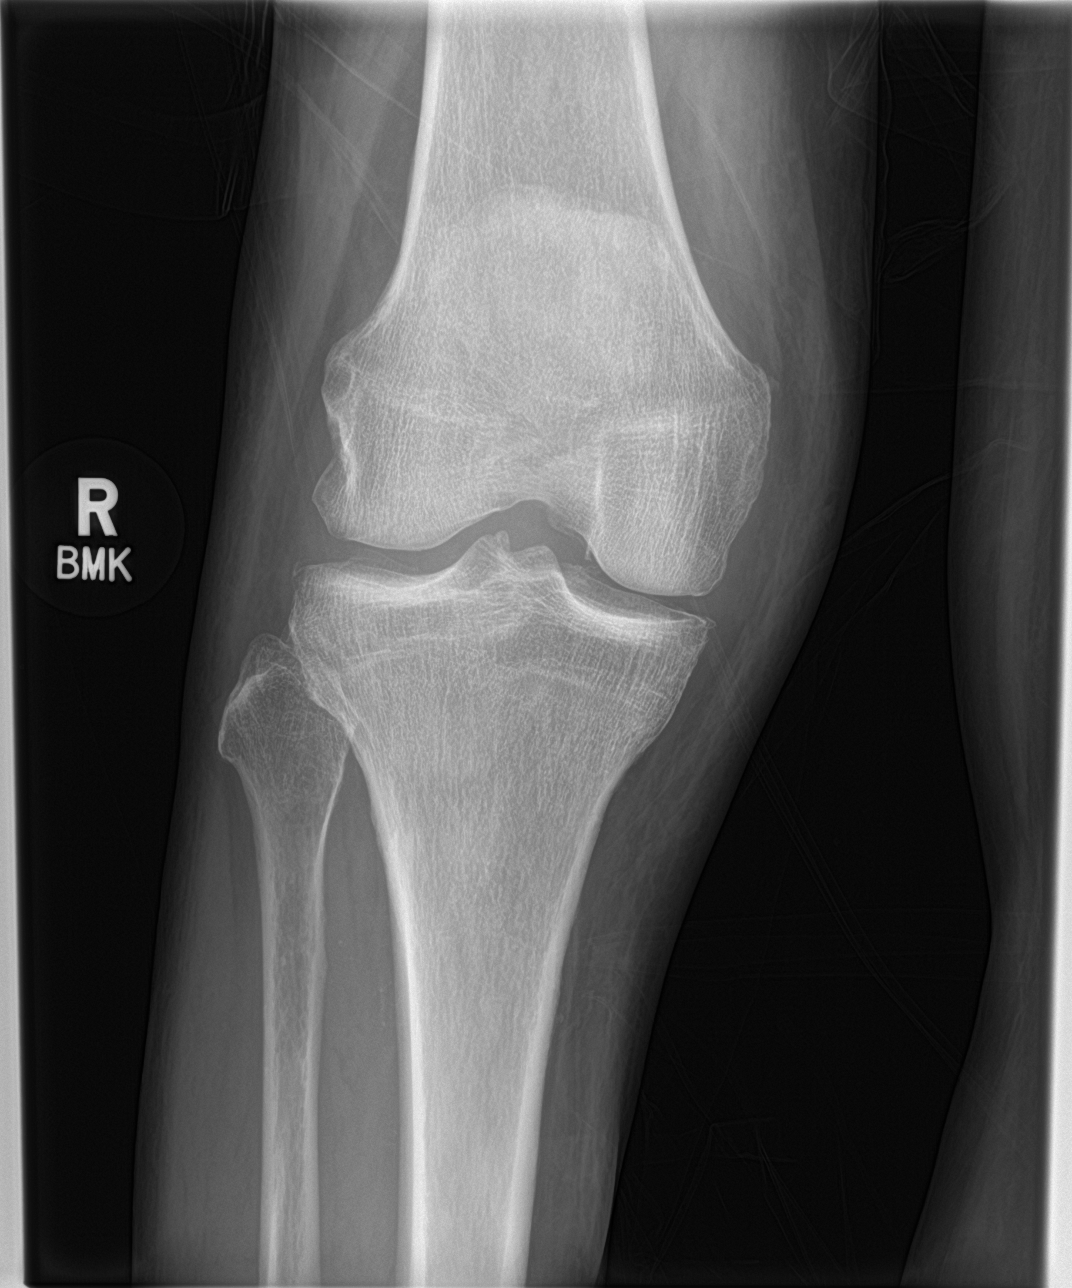
[im 4/4]
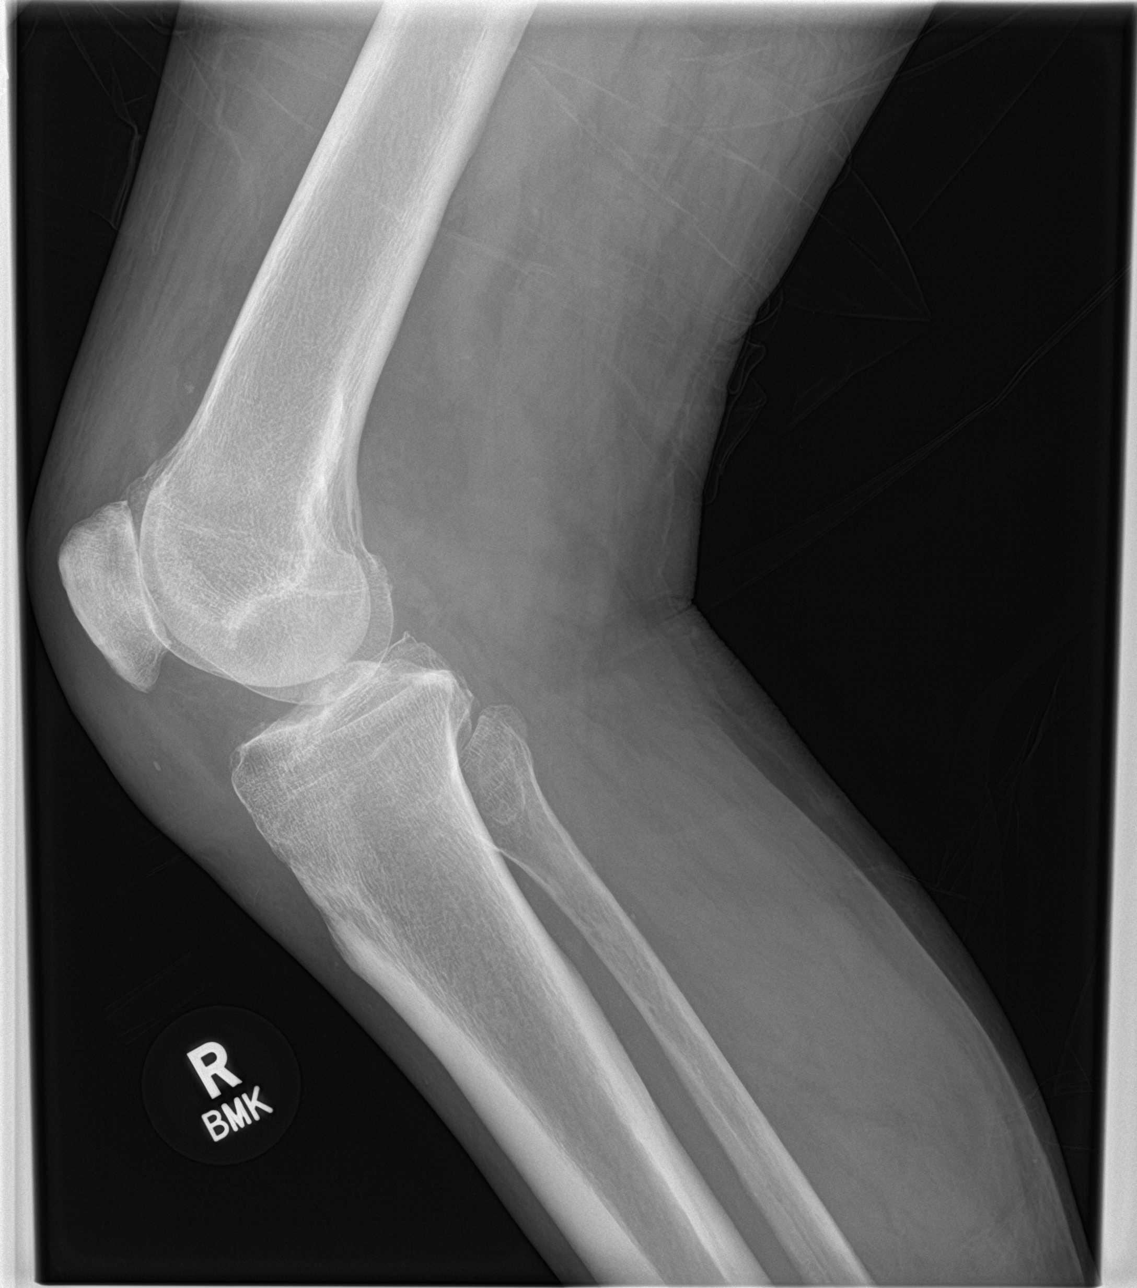

[4 of 4 positions shown; findings below may reference images not displayed]

FINDINGS: Anterior soft tissue swelling. No evidence of joint effusion. Intact
patella. Preserved alignment. Chronic lateral compartment
degenerative spurring. Mild joint space loss. No acute osseous
abnormality identified.
IMPRESSION: Anterior soft tissue swelling. No acute fracture or dislocation
identified about the right knee.

## 2023-04-16 IMAGING — CR DG KNEE COMPLETE 4+V*L*
1 series · 4 of 4 positions shown · non-contrast
Comparison: No prior left knee series.

CLINICAL DATA: 70-year-old male status post falls. Pain.

EXAM:
LEFT KNEE - COMPLETE 4+ VIEW

[Series 1: dg knee complete 4 views left · 0.14mm/px · 4 of 4 slices shown]
[im 1/4]
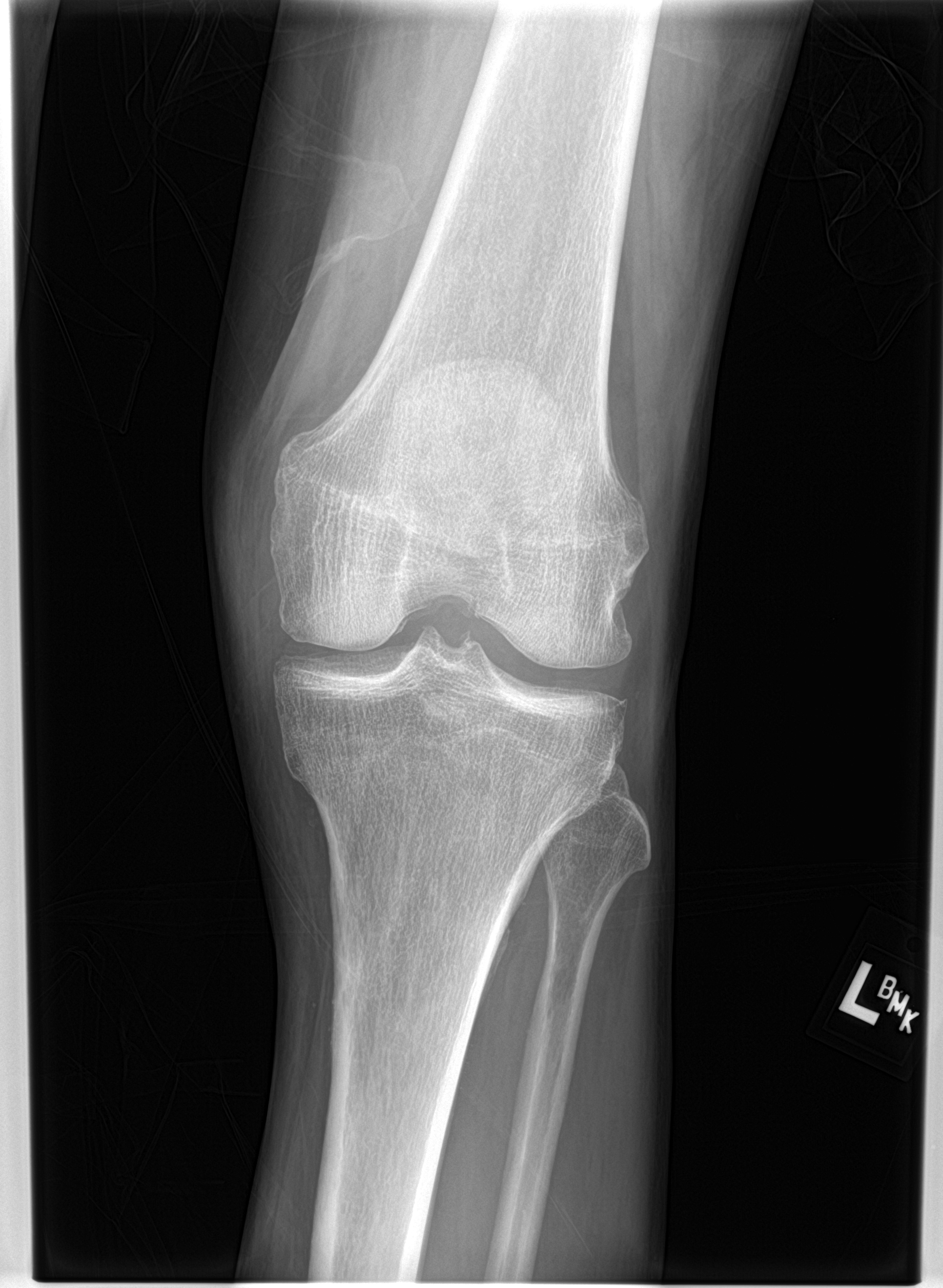
[im 2/4]
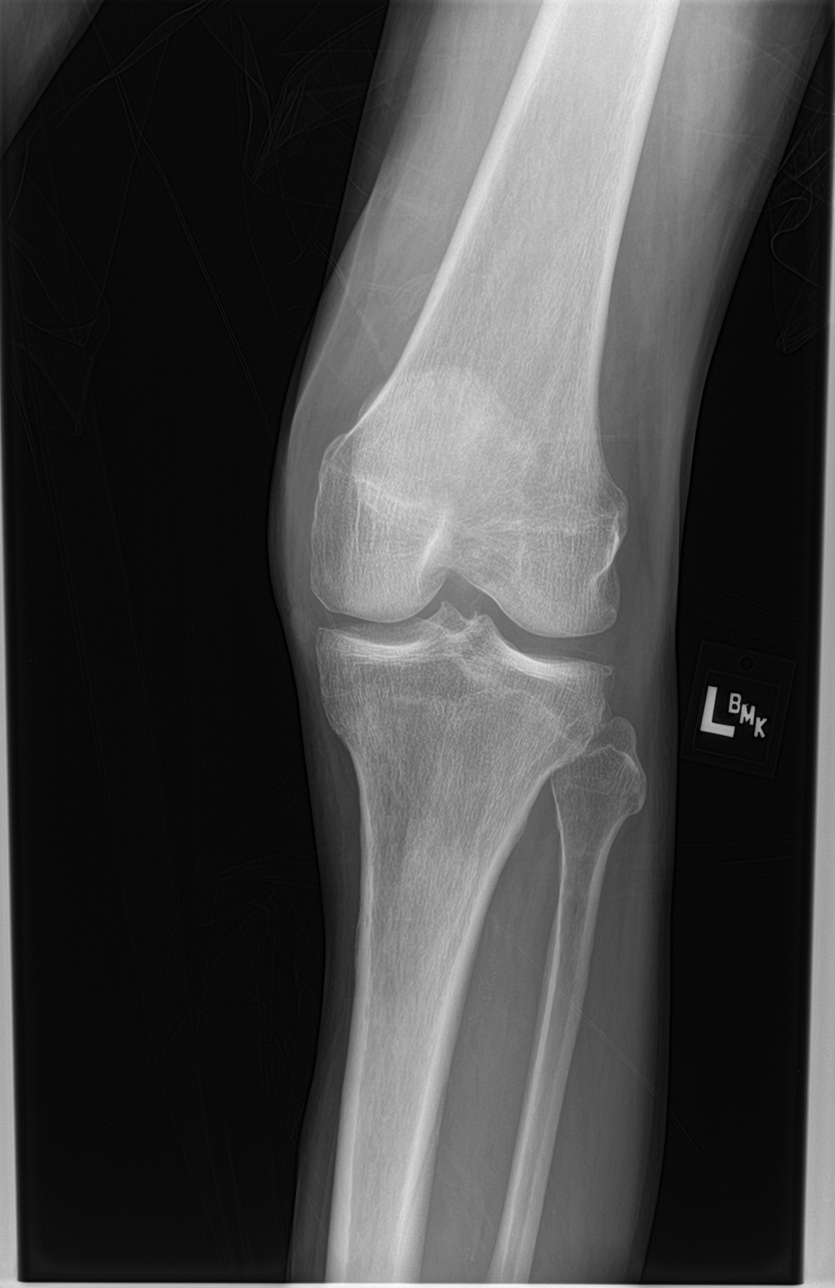
[im 3/4]
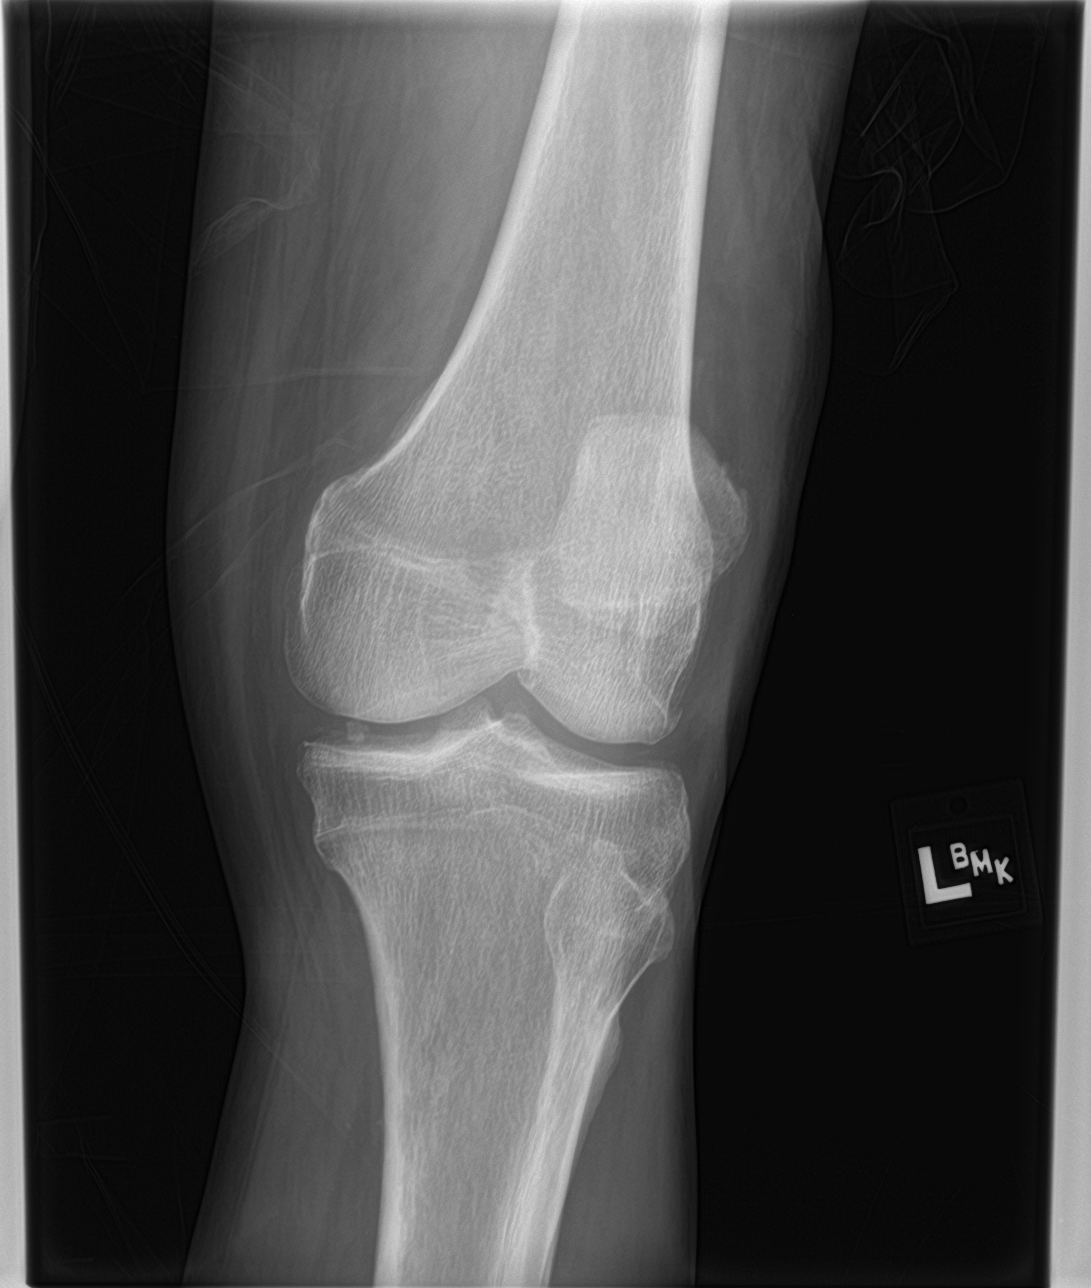
[im 4/4]
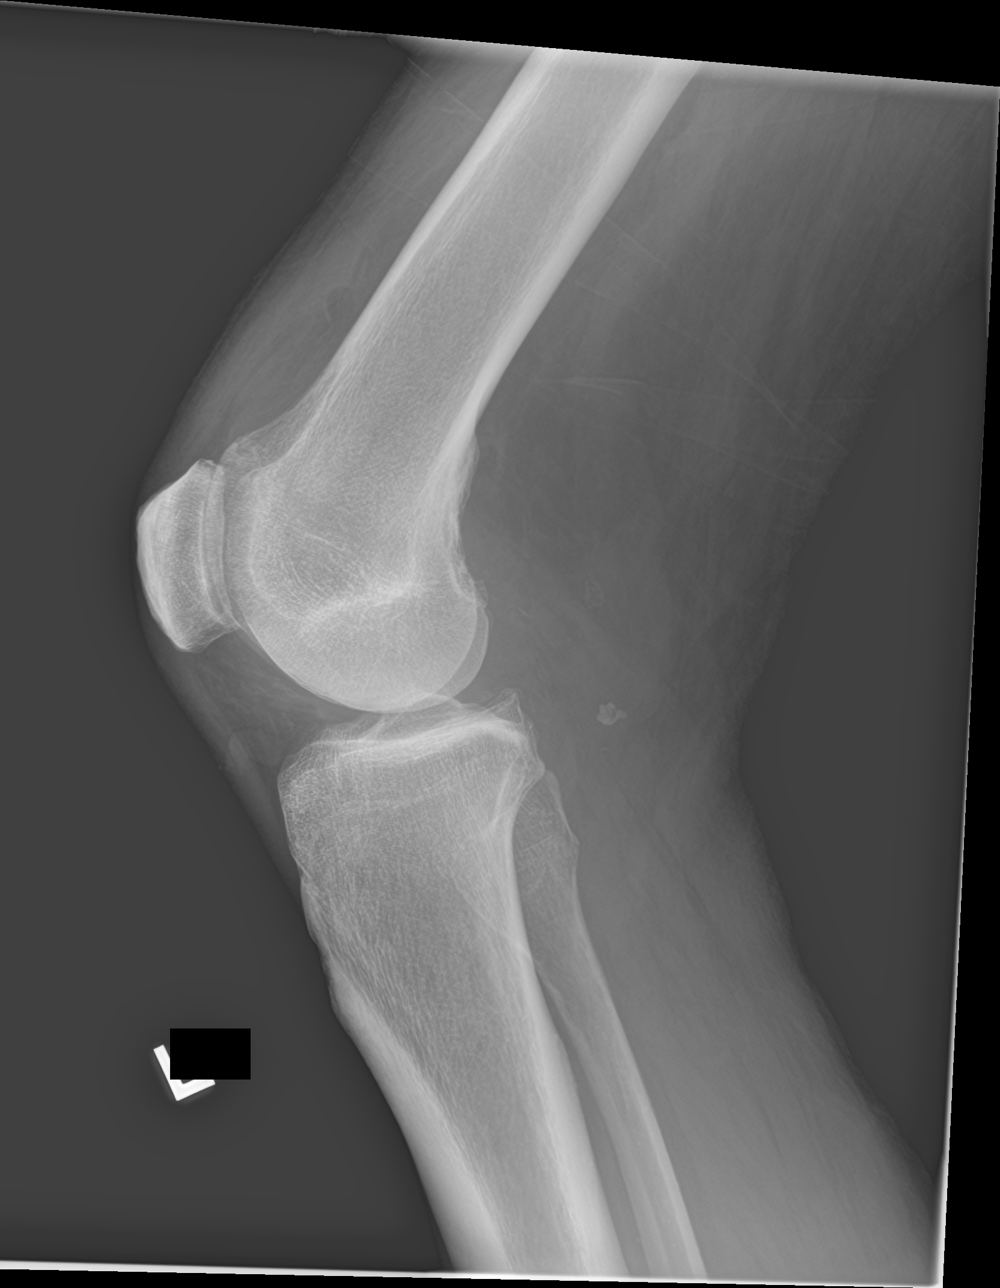

[4 of 4 positions shown; findings below may reference images not displayed]

FINDINGS: No evidence of joint effusion. Intact patella. Preserved alignment.
Mild medial and lateral compartment joint degeneration, mild medial
compartment joint space loss. No acute osseous abnormality
identified. Small dystrophic calcification posterior to the knee. No
discrete soft tissue injury.
IMPRESSION: No acute fracture or dislocation identified about the left knee.

## 2023-04-16 IMAGING — CR DG HIP (WITH OR WITHOUT PELVIS) 2-3V*R*
1 series · 3 of 3 positions shown · non-contrast
Comparison: Pelvis radiograph 08/20/2019.

CLINICAL DATA: 70-year-old male status post fall.  Pain.

EXAM:
DG HIP (WITH OR WITHOUT PELVIS) 2-3V RIGHT

[Series 1: dg hip unilat w or w/o pelvis 2-3 views  · non-contrast · 0.14mm/px · 3 of 3 slices shown]
[im 1/3]
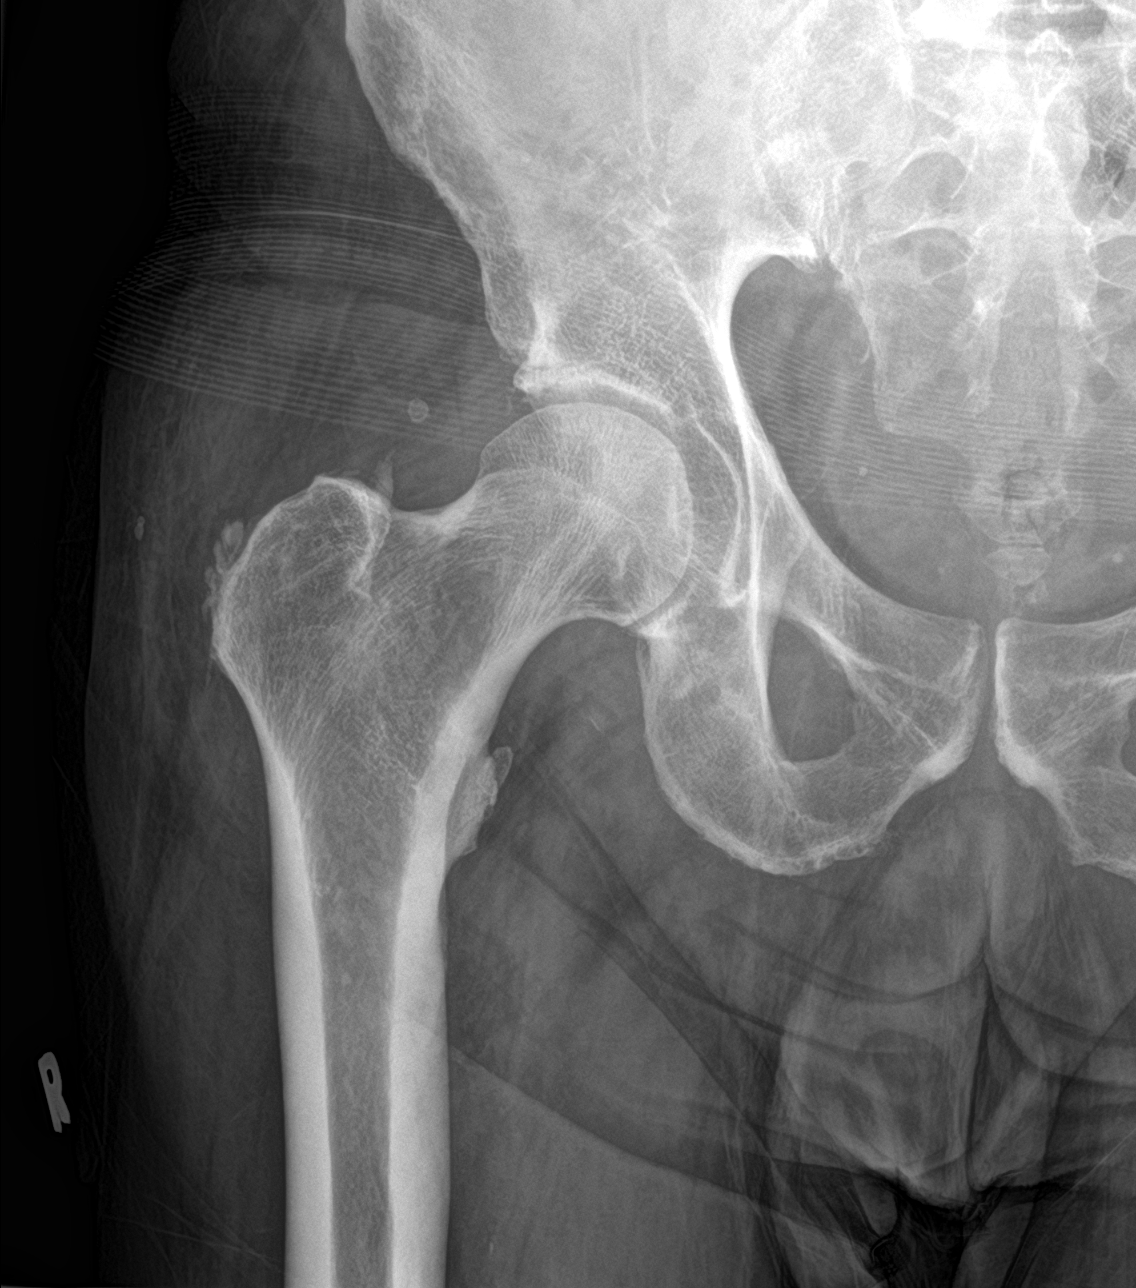
[im 2/3]
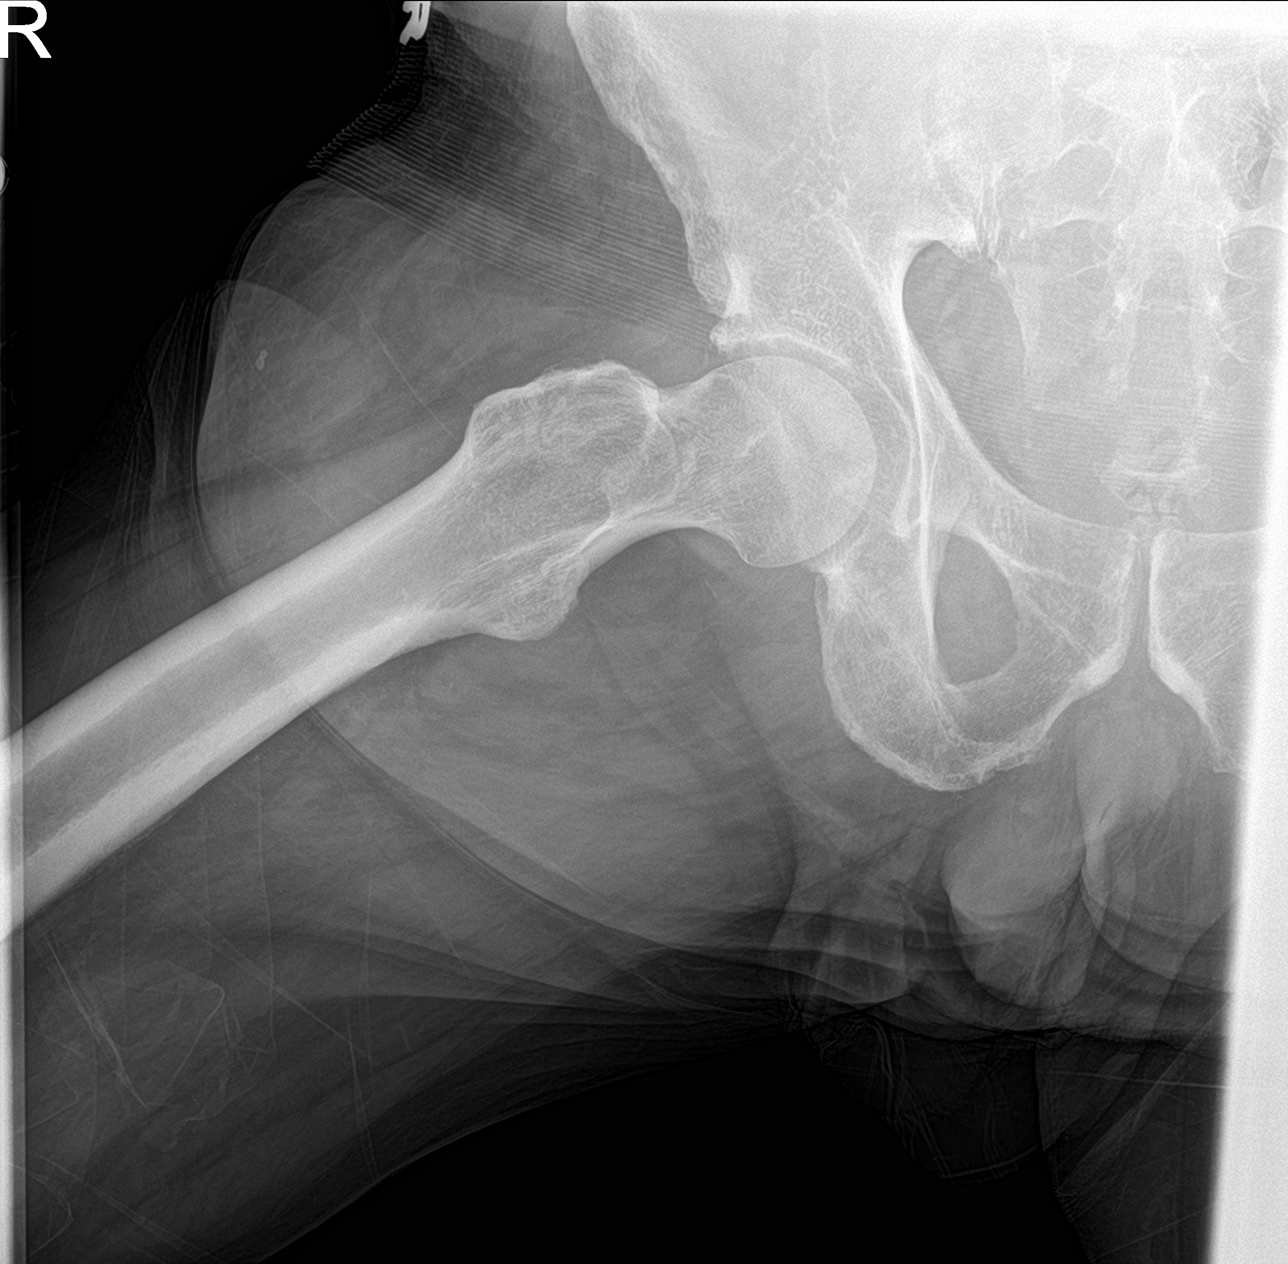
[im 3/3]
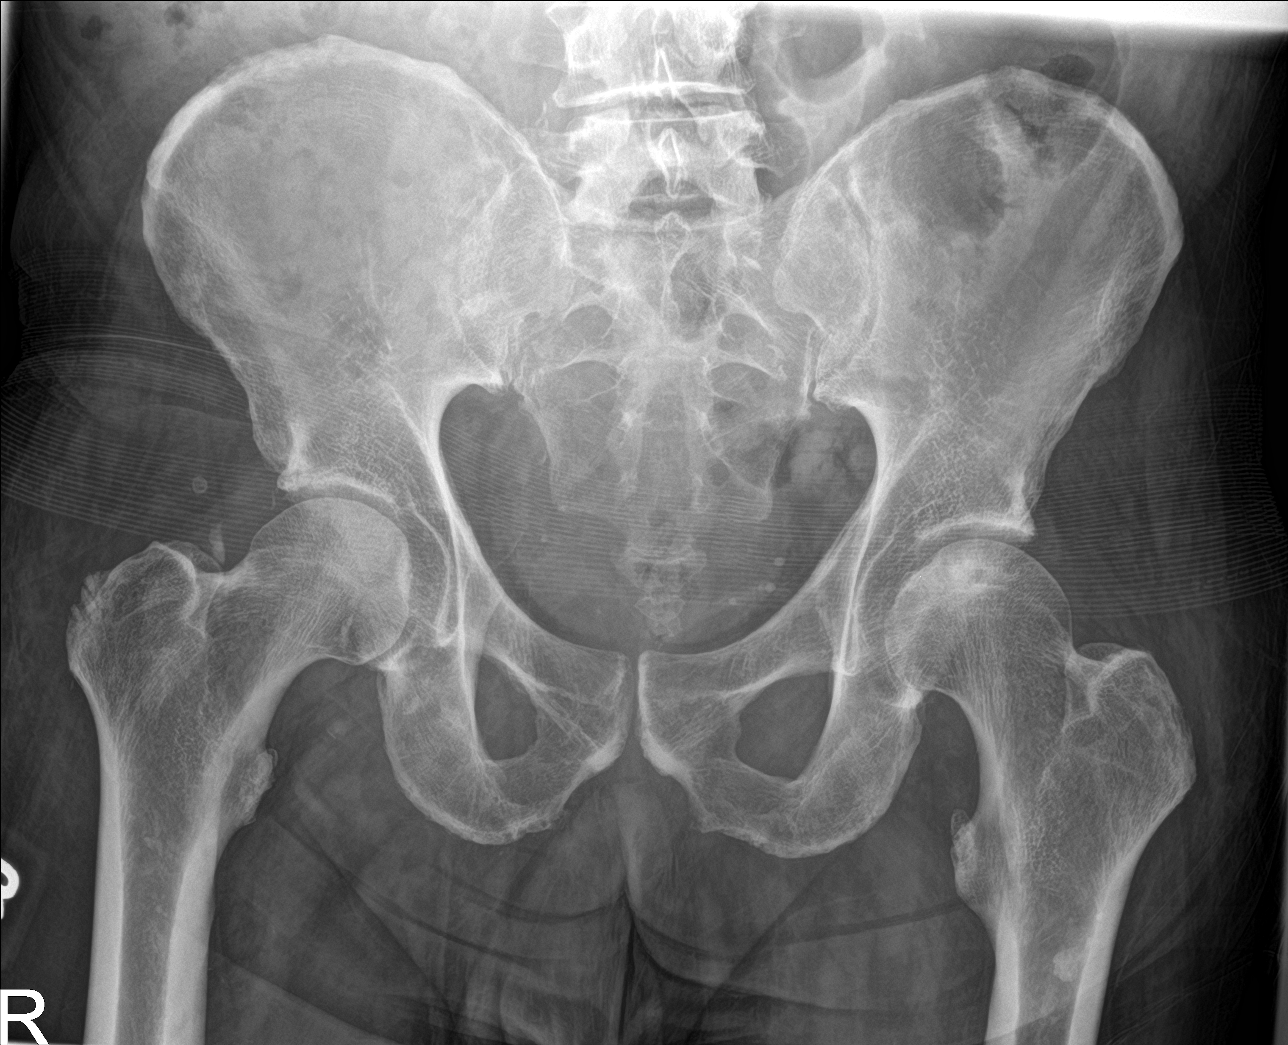

[3 of 3 positions shown; findings below may reference images not displayed]

FINDINGS: Pelvis appears stable and intact. Femoral heads remain normally
located. Stable proximal left femur. Proximal right femur appears
intact. No acute osseous abnormality identified. Small chronic
dystrophic calcifications about the right hip. Negative visible
lower abdominal and pelvic visceral contours.
IMPRESSION: No acute fracture or dislocation identified about the right hip or
pelvis.

## 2023-06-06 ENCOUNTER — Ambulatory Visit: Payer: Medicare Other | Admitting: Internal Medicine

## 2023-06-16 ENCOUNTER — Encounter: Payer: Medicare Other | Admitting: Pharmacist

## 2023-07-21 ENCOUNTER — Encounter: Payer: Medicare Other | Admitting: Pharmacist
